# Patient Record
Sex: Female | Born: 1945 | Race: White | Hispanic: No | Marital: Married | Smoking: Current every day smoker
Health system: Southern US, Community
[De-identification: ages and names within clinical notes are randomized; demographics above are authoritative.]

## PROBLEM LIST (undated history)

## (undated) DIAGNOSIS — F1010 Alcohol abuse, uncomplicated: Secondary | ICD-10-CM

## (undated) DIAGNOSIS — K5792 Diverticulitis of intestine, part unspecified, without perforation or abscess without bleeding: Secondary | ICD-10-CM

## (undated) DIAGNOSIS — I6529 Occlusion and stenosis of unspecified carotid artery: Secondary | ICD-10-CM

## (undated) DIAGNOSIS — F32A Depression, unspecified: Secondary | ICD-10-CM

## (undated) DIAGNOSIS — K759 Inflammatory liver disease, unspecified: Secondary | ICD-10-CM

## (undated) DIAGNOSIS — I10 Essential (primary) hypertension: Secondary | ICD-10-CM

## (undated) DIAGNOSIS — M797 Fibromyalgia: Secondary | ICD-10-CM

## (undated) DIAGNOSIS — F329 Major depressive disorder, single episode, unspecified: Secondary | ICD-10-CM

## (undated) HISTORY — PX: TONSILLECTOMY: SUR1361

## (undated) HISTORY — PX: COLONOSCOPY: SHX174

## (undated) HISTORY — PX: COLONOSCOPY WITH ESOPHAGOGASTRODUODENOSCOPY (EGD): SHX5779

## (undated) HISTORY — PX: BUNIONECTOMY: SHX129

---

## 1998-06-11 ENCOUNTER — Emergency Department (HOSPITAL_COMMUNITY): Admission: EM | Admit: 1998-06-11 | Discharge: 1998-06-12 | Payer: Self-pay | Admitting: Emergency Medicine

## 1998-06-24 ENCOUNTER — Emergency Department (HOSPITAL_COMMUNITY): Admission: EM | Admit: 1998-06-24 | Discharge: 1998-06-24 | Payer: Self-pay

## 1999-02-15 ENCOUNTER — Emergency Department (HOSPITAL_COMMUNITY): Admission: EM | Admit: 1999-02-15 | Discharge: 1999-02-15 | Payer: Self-pay | Admitting: Emergency Medicine

## 2003-06-25 ENCOUNTER — Emergency Department (HOSPITAL_COMMUNITY): Admission: EM | Admit: 2003-06-25 | Discharge: 2003-06-25 | Payer: Self-pay | Admitting: *Deleted

## 2003-08-14 ENCOUNTER — Inpatient Hospital Stay (HOSPITAL_COMMUNITY): Admission: EM | Admit: 2003-08-14 | Discharge: 2003-08-16 | Payer: Self-pay | Admitting: Emergency Medicine

## 2003-10-06 ENCOUNTER — Emergency Department (HOSPITAL_COMMUNITY): Admission: EM | Admit: 2003-10-06 | Discharge: 2003-10-07 | Payer: Self-pay | Admitting: Emergency Medicine

## 2004-08-28 ENCOUNTER — Inpatient Hospital Stay (HOSPITAL_COMMUNITY): Admission: RE | Admit: 2004-08-28 | Discharge: 2004-09-01 | Payer: Self-pay | Admitting: Psychiatry

## 2004-08-28 ENCOUNTER — Ambulatory Visit: Payer: Self-pay | Admitting: Psychiatry

## 2004-10-06 ENCOUNTER — Other Ambulatory Visit (HOSPITAL_COMMUNITY): Admission: RE | Admit: 2004-10-06 | Discharge: 2005-01-04 | Payer: Self-pay | Admitting: Psychiatry

## 2004-12-16 ENCOUNTER — Emergency Department (HOSPITAL_COMMUNITY): Admission: EM | Admit: 2004-12-16 | Discharge: 2004-12-16 | Payer: Self-pay | Admitting: Emergency Medicine

## 2004-12-19 ENCOUNTER — Emergency Department (HOSPITAL_COMMUNITY): Admission: EM | Admit: 2004-12-19 | Discharge: 2004-12-19 | Payer: Self-pay | Admitting: Emergency Medicine

## 2005-04-02 ENCOUNTER — Ambulatory Visit: Payer: Self-pay | Admitting: Internal Medicine

## 2005-09-09 ENCOUNTER — Ambulatory Visit: Payer: Self-pay | Admitting: Internal Medicine

## 2005-10-04 ENCOUNTER — Ambulatory Visit: Payer: Self-pay | Admitting: Internal Medicine

## 2005-10-20 ENCOUNTER — Ambulatory Visit: Payer: Self-pay | Admitting: Internal Medicine

## 2006-06-02 ENCOUNTER — Emergency Department (HOSPITAL_COMMUNITY): Admission: EM | Admit: 2006-06-02 | Discharge: 2006-06-02 | Payer: Self-pay | Admitting: *Deleted

## 2006-06-03 ENCOUNTER — Ambulatory Visit: Payer: Self-pay | Admitting: Internal Medicine

## 2006-06-12 ENCOUNTER — Inpatient Hospital Stay (HOSPITAL_COMMUNITY): Admission: EM | Admit: 2006-06-12 | Discharge: 2006-06-15 | Payer: Self-pay | Admitting: *Deleted

## 2006-06-12 ENCOUNTER — Emergency Department (HOSPITAL_COMMUNITY): Admission: EM | Admit: 2006-06-12 | Discharge: 2006-06-12 | Payer: Self-pay | Admitting: Emergency Medicine

## 2006-06-13 ENCOUNTER — Ambulatory Visit: Payer: Self-pay | Admitting: *Deleted

## 2006-11-17 ENCOUNTER — Ambulatory Visit: Payer: Self-pay | Admitting: Internal Medicine

## 2006-11-17 LAB — CONVERTED CEMR LAB
ALT: 52 units/L — ABNORMAL HIGH (ref 0–40)
AST: 46 units/L — ABNORMAL HIGH (ref 0–37)
Albumin: 4.3 g/dL (ref 3.5–5.2)
Alkaline Phosphatase: 81 units/L (ref 39–117)
BUN: 17 mg/dL (ref 6–23)
Basophils Absolute: 0.1 10*3/uL (ref 0.0–0.1)
Basophils Relative: 0.7 % (ref 0.0–1.0)
Bilirubin Urine: NEGATIVE
CO2: 27 meq/L (ref 19–32)
Calcium: 9.8 mg/dL (ref 8.4–10.5)
Chloride: 101 meq/L (ref 96–112)
Chol/HDL Ratio, serum: 4.3
Cholesterol: 243 mg/dL (ref 0–200)
Creatinine, Ser: 0.6 mg/dL (ref 0.4–1.2)
Creatinine,U: 194.8 mg/dL
Eosinophil percent: 1.5 % (ref 0.0–5.0)
GFR calc non Af Amer: 109 mL/min
Glomerular Filtration Rate, Af Am: 132 mL/min/{1.73_m2}
Glucose, Bld: 123 mg/dL — ABNORMAL HIGH (ref 70–99)
HCT: 39.8 % (ref 36.0–46.0)
HDL: 56.1 mg/dL (ref >39.0)
Hemoglobin, Urine: NEGATIVE
Hemoglobin: 13.2 g/dL (ref 12.0–15.0)
Hgb A1c MFr Bld: 7.8 % — ABNORMAL HIGH (ref 4.6–6.0)
LDL DIRECT: 186.5 mg/dL
Leukocytes, UA: NEGATIVE
Lymphocytes Relative: 24.6 % (ref 12.0–46.0)
MCHC: 33.3 g/dL (ref 30.0–36.0)
MCV: 98.7 fL (ref 78.0–100.0)
Microalb Creat Ratio: 10.8 mg/g (ref 0.0–30.0)
Microalb, Ur: 2.1 mg/dL — ABNORMAL HIGH (ref 0.0–1.9)
Monocytes Absolute: 0.6 10*3/uL (ref 0.2–0.7)
Monocytes Relative: 8.4 % (ref 3.0–11.0)
Neutro Abs: 4.6 10*3/uL (ref 1.4–7.7)
Neutrophils Relative %: 64.8 % (ref 43.0–77.0)
Nitrite: NEGATIVE
Platelets: 342 10*3/uL (ref 150–400)
Potassium: 3.6 meq/L (ref 3.5–5.1)
RBC: 4.03 M/uL (ref 3.87–5.11)
RDW: 12.8 % (ref 11.5–14.6)
Sodium: 138 meq/L (ref 135–145)
Specific Gravity, Urine: 1.025 (ref 1.000–1.03)
TSH: 4.64 microintl units/mL (ref 0.35–5.50)
Total Bilirubin: 0.7 mg/dL (ref 0.3–1.2)
Total Protein, Urine: NEGATIVE mg/dL
Total Protein: 7.3 g/dL (ref 6.0–8.3)
Triglyceride fasting, serum: 91 mg/dL (ref 0–149)
Urine Glucose: NEGATIVE mg/dL
Urobilinogen, UA: 0.2 (ref 0.0–1.0)
VLDL: 18 mg/dL (ref 0–40)
WBC: 7.2 10*3/uL (ref 4.5–10.5)
pH: 6.5 (ref 5.0–8.0)

## 2007-01-18 ENCOUNTER — Ambulatory Visit: Payer: Self-pay | Admitting: Internal Medicine

## 2007-03-13 ENCOUNTER — Emergency Department (HOSPITAL_COMMUNITY): Admission: EM | Admit: 2007-03-13 | Discharge: 2007-03-14 | Payer: Self-pay | Admitting: Emergency Medicine

## 2007-03-14 ENCOUNTER — Ambulatory Visit: Payer: Self-pay | Admitting: *Deleted

## 2007-03-14 ENCOUNTER — Inpatient Hospital Stay (HOSPITAL_COMMUNITY): Admission: EM | Admit: 2007-03-14 | Discharge: 2007-03-17 | Payer: Self-pay | Admitting: Psychiatry

## 2007-03-20 ENCOUNTER — Other Ambulatory Visit (HOSPITAL_COMMUNITY): Admission: RE | Admit: 2007-03-20 | Discharge: 2007-06-18 | Payer: Self-pay | Admitting: Psychiatry

## 2007-06-30 ENCOUNTER — Ambulatory Visit: Payer: Self-pay | Admitting: Endocrinology

## 2007-07-03 ENCOUNTER — Emergency Department (HOSPITAL_COMMUNITY): Admission: EM | Admit: 2007-07-03 | Discharge: 2007-07-03 | Payer: Self-pay | Admitting: Emergency Medicine

## 2007-08-20 ENCOUNTER — Encounter: Payer: Self-pay | Admitting: Internal Medicine

## 2007-08-20 DIAGNOSIS — I10 Essential (primary) hypertension: Secondary | ICD-10-CM

## 2007-08-20 DIAGNOSIS — E785 Hyperlipidemia, unspecified: Secondary | ICD-10-CM

## 2007-08-20 DIAGNOSIS — F329 Major depressive disorder, single episode, unspecified: Secondary | ICD-10-CM | POA: Insufficient documentation

## 2007-08-20 DIAGNOSIS — F411 Generalized anxiety disorder: Secondary | ICD-10-CM | POA: Insufficient documentation

## 2007-08-20 DIAGNOSIS — F3289 Other specified depressive episodes: Secondary | ICD-10-CM | POA: Insufficient documentation

## 2007-08-20 HISTORY — DX: Essential (primary) hypertension: I10

## 2007-08-20 HISTORY — DX: Hyperlipidemia, unspecified: E78.5

## 2008-01-05 ENCOUNTER — Encounter: Payer: Self-pay | Admitting: Internal Medicine

## 2008-01-12 ENCOUNTER — Ambulatory Visit: Payer: Self-pay | Admitting: Psychiatry

## 2008-01-12 ENCOUNTER — Inpatient Hospital Stay (HOSPITAL_COMMUNITY): Admission: AD | Admit: 2008-01-12 | Discharge: 2008-01-16 | Payer: Self-pay | Admitting: Psychiatry

## 2008-01-12 ENCOUNTER — Emergency Department (HOSPITAL_COMMUNITY): Admission: EM | Admit: 2008-01-12 | Discharge: 2008-01-12 | Payer: Self-pay | Admitting: Emergency Medicine

## 2008-06-10 ENCOUNTER — Emergency Department (HOSPITAL_COMMUNITY): Admission: EM | Admit: 2008-06-10 | Discharge: 2008-06-10 | Payer: Self-pay | Admitting: Emergency Medicine

## 2008-10-27 ENCOUNTER — Emergency Department (HOSPITAL_COMMUNITY): Admission: EM | Admit: 2008-10-27 | Discharge: 2008-10-27 | Payer: Self-pay | Admitting: Emergency Medicine

## 2008-10-31 ENCOUNTER — Emergency Department (HOSPITAL_COMMUNITY): Admission: EM | Admit: 2008-10-31 | Discharge: 2008-10-31 | Payer: Self-pay | Admitting: Emergency Medicine

## 2009-04-19 ENCOUNTER — Emergency Department (HOSPITAL_COMMUNITY): Admission: EM | Admit: 2009-04-19 | Discharge: 2009-04-19 | Payer: Self-pay | Admitting: Emergency Medicine

## 2009-04-19 ENCOUNTER — Ambulatory Visit: Payer: Self-pay | Admitting: Psychiatry

## 2009-04-19 ENCOUNTER — Inpatient Hospital Stay (HOSPITAL_COMMUNITY): Admission: AD | Admit: 2009-04-19 | Discharge: 2009-04-22 | Payer: Self-pay | Admitting: Psychiatry

## 2009-06-12 ENCOUNTER — Emergency Department (HOSPITAL_COMMUNITY): Admission: EM | Admit: 2009-06-12 | Discharge: 2009-06-12 | Payer: Self-pay | Admitting: Emergency Medicine

## 2009-07-20 ENCOUNTER — Emergency Department (HOSPITAL_COMMUNITY): Admission: EM | Admit: 2009-07-20 | Discharge: 2009-07-21 | Payer: Self-pay | Admitting: Emergency Medicine

## 2009-11-18 ENCOUNTER — Emergency Department (HOSPITAL_COMMUNITY): Admission: EM | Admit: 2009-11-18 | Discharge: 2009-11-19 | Payer: Self-pay | Admitting: Emergency Medicine

## 2010-04-25 ENCOUNTER — Emergency Department (HOSPITAL_COMMUNITY): Admission: EM | Admit: 2010-04-25 | Discharge: 2010-04-26 | Payer: Self-pay | Admitting: Emergency Medicine

## 2010-10-15 ENCOUNTER — Ambulatory Visit (HOSPITAL_COMMUNITY): Admission: RE | Admit: 2010-10-15 | Discharge: 2010-10-15 | Payer: Self-pay | Admitting: Psychiatry

## 2010-10-19 ENCOUNTER — Other Ambulatory Visit (HOSPITAL_COMMUNITY)
Admission: RE | Admit: 2010-10-19 | Discharge: 2010-12-28 | Payer: Self-pay | Source: Home / Self Care | Attending: Psychiatry | Admitting: Psychiatry

## 2010-10-20 ENCOUNTER — Encounter (INDEPENDENT_AMBULATORY_CARE_PROVIDER_SITE_OTHER): Payer: Self-pay | Admitting: *Deleted

## 2010-10-21 ENCOUNTER — Encounter: Admission: RE | Admit: 2010-10-21 | Discharge: 2010-10-21 | Payer: Self-pay | Admitting: Gastroenterology

## 2010-11-04 ENCOUNTER — Ambulatory Visit: Payer: Self-pay | Admitting: Psychiatry

## 2010-11-06 ENCOUNTER — Emergency Department (HOSPITAL_COMMUNITY): Admission: EM | Admit: 2010-11-06 | Discharge: 2010-11-06 | Payer: Self-pay | Admitting: Emergency Medicine

## 2010-11-20 ENCOUNTER — Telehealth (INDEPENDENT_AMBULATORY_CARE_PROVIDER_SITE_OTHER): Payer: Self-pay | Admitting: *Deleted

## 2010-11-24 ENCOUNTER — Ambulatory Visit: Payer: Self-pay | Admitting: Gastroenterology

## 2010-12-28 LAB — BENZODIAZEPINE, QUANTITATIVE, URINE
Alprazolam (GC/LC/MS), ur confirm: NEGATIVE NG/ML
Flurazepam GC/MS Conf: NEGATIVE NG/ML
Lorazepam UR QT: NEGATIVE NG/ML
Nordiazepam GC/MS Conf: NEGATIVE NG/ML
Oxazepam GC/MS Conf: 2346 NG/ML — ABNORMAL HIGH
Temazepam GC/MS Conf: 452 NG/ML — ABNORMAL HIGH

## 2010-12-28 LAB — URINE DRUGS OF ABUSE SCREEN W ALC, ROUTINE (REF LAB)
Amphetamine Screen, Ur: POSITIVE — AB
Amphetamine Screen, Ur: POSITIVE — AB
Barbiturate Quant, Ur: NEGATIVE
Barbiturate Quant, Ur: NEGATIVE
Benzodiazepines.: POSITIVE — AB
Benzodiazepines.: POSITIVE — AB
Cocaine Metabolites: NEGATIVE
Cocaine Metabolites: NEGATIVE
Creatinine,U: 188.9 mg/dL
Creatinine,U: 173.3 mg/dL
Ethyl Alcohol: <10 mg/dL (ref <10)
Ethyl Alcohol: <10 mg/dL (ref <10)
Marijuana Metabolite: NEGATIVE
Marijuana Metabolite: NEGATIVE
Methadone: NEGATIVE
Methadone: NEGATIVE
Opiate Screen, Urine: NEGATIVE
Opiate Screen, Urine: NEGATIVE
Phencyclidine (PCP): NEGATIVE
Phencyclidine (PCP): NEGATIVE
Propoxyphene: NEGATIVE
Propoxyphene: NEGATIVE

## 2010-12-28 LAB — AMPHETAMINES URINE CONFIRMATION
Amphetamines: 251707 NG/ML — ABNORMAL HIGH
Methamphetamine GC/MS, Ur: NEGATIVE NG/ML
Methylenedioxyamphetamine: NEGATIVE NG/ML
Methylenedioxyethylamphetamine: NEGATIVE NG/ML
Methylenedioxymethamphetamine: NEGATIVE NG/ML

## 2011-01-04 LAB — AMPHETAMINES URINE CONFIRMATION
Amphetamines: 14895 NG/ML — ABNORMAL HIGH
Methamphetamine GC/MS, Ur: NEGATIVE NG/ML
Methylenedioxyamphetamine: NEGATIVE NG/ML
Methylenedioxyethylamphetamine: NEGATIVE NG/ML
Methylenedioxymethamphetamine: NEGATIVE NG/ML

## 2011-01-04 LAB — BENZODIAZEPINE, QUANTITATIVE, URINE
Alprazolam (GC/LC/MS), ur confirm: NEGATIVE NG/ML
Flurazepam GC/MS Conf: NEGATIVE NG/ML
Lorazepam UR QT: NEGATIVE NG/ML
Nordiazepam GC/MS Conf: NEGATIVE NG/ML
Oxazepam GC/MS Conf: 891 NG/ML — ABNORMAL HIGH
Temazepam GC/MS Conf: 184 NG/ML — ABNORMAL HIGH

## 2011-01-12 NOTE — Progress Notes (Signed)
[facility]   Phone Note Outgoing Call Call back at [facility] Phone [phone]   Call placed by: Harlow Mares CMA [facility]),  November 20, 2010 2:10 PM Call placed to: Patient Summary of Call: Called pt to find out where she had her abnormal labs from and she states that her abnormal labs are from [facility], Dr. Evette Cristal. She states that she saw him in november. I called [facility] and they say that the patient had a colonoscopy on 10/30/2010 with Dr. Evette Cristal. I advised the patient that since her GI hx was within a year we would be canceling her appt with Dr. Jarold Motto and if she wishes to have an appt with our practice she will have to get all of her GI records faxed over to our office and then a MD will review and let her know if we are able to make her an appt with one of our MDS. patient verbalized understanding. Appt cxed.  Initial call taken by: Harlow Mares CMA [facility]),  November 20, 2010 2:13 PM

## 2011-01-12 NOTE — Letter (Signed)
Summary: New Patient letter  [facility]  [address] [place], [place] [address]   Phone: [phone]  Fax: [phone]       10/20/2010 MRN: [id number]  [name] [address] [place], [place]  [address]  Dear Ms. Molony,  Welcome to the Gastroenterology Division at [facility].    You are scheduled to see Dr.  Jarold Motto on 11/24/2010 at 1:30PM on the 3rd floor at [facility], [address]  We ask that you try to arrive at our office 15 minutes prior to your appointment time to allow for check-in.  We would like you to complete the enclosed self-administered evaluation form prior to your visit and bring it with you on the day of your appointment.  We will review it with you.  Also, please bring a complete list of all your medications or, if you prefer, bring the medication bottles and we will list them.  Please bring your insurance card so that we may make a copy of it.  If your insurance requires a referral to see a specialist, please bring your referral form from your primary care physician.  Co-payments are due at the time of your visit and may be paid by cash, check or credit card.     Your office visit will consist of a consult with your physician (includes a physical exam), any laboratory testing he/she may order, scheduling of any necessary diagnostic testing (e.g. x-ray, ultrasound, CT-scan), and scheduling of a procedure (e.g. Endoscopy, Colonoscopy) if required.  Please allow enough time on your schedule to allow for any/all of these possibilities.    If you cannot keep your appointment, please call [phone] to cancel or reschedule prior to your appointment date.  This allows [place] the opportunity to schedule an appointment for another patient in need of care.  If you do not cancel or reschedule by 5 p.m. the business day prior to your appointment date, you will be charged a $50.00 late cancellation/no-show fee.    Thank you for choosing  [facility] for your medical needs.  We appreciate the opportunity to care for you.  Please visit [place] at our website  to learn more about our practice.                     Sincerely,                                                             The Gastroenterology Division

## 2011-02-22 LAB — URINE DRUGS OF ABUSE SCREEN W ALC, ROUTINE (REF LAB)
Amphetamine Screen, Ur: POSITIVE — AB
Amphetamine Screen, Ur: POSITIVE — AB
Barbiturate Quant, Ur: NEGATIVE
Barbiturate Quant, Ur: NEGATIVE
Benzodiazepines.: POSITIVE — AB
Benzodiazepines.: POSITIVE — AB
Cocaine Metabolites: NEGATIVE
Cocaine Metabolites: NEGATIVE
Creatinine,U: 154.7 mg/dL
Creatinine,U: 160.9 mg/dL
Ethyl Alcohol: <10 mg/dL (ref <10)
Ethyl Alcohol: <10 mg/dL (ref <10)
Marijuana Metabolite: NEGATIVE
Marijuana Metabolite: NEGATIVE
Methadone: NEGATIVE
Methadone: NEGATIVE
Opiate Screen, Urine: NEGATIVE
Opiate Screen, Urine: NEGATIVE
Phencyclidine (PCP): NEGATIVE
Phencyclidine (PCP): NEGATIVE
Propoxyphene: NEGATIVE
Propoxyphene: NEGATIVE

## 2011-02-22 LAB — BENZODIAZEPINE, QUANTITATIVE, URINE
Alprazolam (GC/LC/MS), ur confirm: NEGATIVE NG/ML
Alprazolam (GC/LC/MS), ur confirm: NEGATIVE NG/ML
Flurazepam GC/MS Conf: NEGATIVE NG/ML
Flurazepam GC/MS Conf: NEGATIVE NG/ML
Lorazepam UR QT: NEGATIVE NG/ML
Lorazepam UR QT: NEGATIVE NG/ML
Nordiazepam GC/MS Conf: 150 NG/ML — ABNORMAL HIGH
Nordiazepam GC/MS Conf: 270 NG/ML — ABNORMAL HIGH
Oxazepam GC/MS Conf: 2417 NG/ML — ABNORMAL HIGH
Oxazepam GC/MS Conf: 3797 NG/ML — ABNORMAL HIGH
Temazepam GC/MS Conf: 1595 NG/ML — ABNORMAL HIGH
Temazepam GC/MS Conf: 694 NG/ML — ABNORMAL HIGH

## 2011-02-22 LAB — AMPHETAMINES URINE CONFIRMATION
Amphetamines: 123504 NG/ML — ABNORMAL HIGH
Amphetamines: 94140 NG/ML — ABNORMAL HIGH
Methamphetamine GC/MS, Ur: NEGATIVE NG/ML
Methamphetamine GC/MS, Ur: NEGATIVE NG/ML
Methylenedioxyamphetamine: NEGATIVE NG/ML
Methylenedioxyamphetamine: NEGATIVE NG/ML
Methylenedioxyethylamphetamine: NEGATIVE NG/ML
Methylenedioxyethylamphetamine: NEGATIVE NG/ML
Methylenedioxymethamphetamine: NEGATIVE NG/ML
Methylenedioxymethamphetamine: NEGATIVE NG/ML

## 2011-02-23 LAB — DIFFERENTIAL
Basophils Absolute: 0 10*3/uL (ref 0.0–0.1)
Basophils Relative: 0 % (ref 0–1)
Eosinophils Absolute: 0 10*3/uL (ref 0.0–0.7)
Eosinophils Relative: 1 % (ref 0–5)
Lymphocytes Relative: 30 % (ref 12–46)
Lymphs Abs: 1.8 10*3/uL (ref 0.7–4.0)
Monocytes Absolute: 0.4 10*3/uL (ref 0.1–1.0)
Monocytes Relative: 6 % (ref 3–12)
Neutro Abs: 3.7 10*3/uL (ref 1.7–7.7)
Neutrophils Relative %: 63 % (ref 43–77)

## 2011-02-23 LAB — URINE DRUGS OF ABUSE SCREEN W ALC, ROUTINE (REF LAB)
Amphetamine Screen, Ur: NEGATIVE
Barbiturate Quant, Ur: NEGATIVE
Benzodiazepines.: POSITIVE — AB
Cocaine Metabolites: NEGATIVE
Creatinine,U: 200.8 mg/dL
Ethyl Alcohol: <10 mg/dL (ref <10)
Marijuana Metabolite: NEGATIVE
Methadone: NEGATIVE
Opiate Screen, Urine: NEGATIVE
Phencyclidine (PCP): NEGATIVE
Propoxyphene: NEGATIVE

## 2011-02-23 LAB — COMPREHENSIVE METABOLIC PANEL
ALT: 60 U/L — ABNORMAL HIGH (ref 0–35)
AST: 107 U/L — ABNORMAL HIGH (ref 0–37)
Albumin: 4 g/dL (ref 3.5–5.2)
Alkaline Phosphatase: 185 U/L — ABNORMAL HIGH (ref 39–117)
BUN: 17 mg/dL (ref 6–23)
CO2: 27 mEq/L (ref 19–32)
Calcium: 8.7 mg/dL (ref 8.4–10.5)
Chloride: 97 mEq/L (ref 96–112)
Creatinine, Ser: 0.81 mg/dL (ref 0.4–1.2)
GFR calc Af Amer: >60 mL/min (ref >60)
GFR calc non Af Amer: >60 mL/min (ref >60)
Glucose, Bld: 210 mg/dL — ABNORMAL HIGH (ref 70–99)
Potassium: 3.5 mEq/L (ref 3.5–5.1)
Sodium: 137 mEq/L (ref 135–145)
Total Bilirubin: 2 mg/dL — ABNORMAL HIGH (ref 0.3–1.2)
Total Protein: 7.1 g/dL (ref 6.0–8.3)

## 2011-02-23 LAB — URINALYSIS, ROUTINE W REFLEX MICROSCOPIC
Glucose, UA: NEGATIVE mg/dL
Nitrite: POSITIVE — AB
Protein, ur: 100 mg/dL — AB
Specific Gravity, Urine: 1.026 (ref 1.005–1.030)
Urobilinogen, UA: 0.2 mg/dL (ref 0.0–1.0)
pH: 6 (ref 5.0–8.0)

## 2011-02-23 LAB — GLUCOSE, CAPILLARY: Glucose-Capillary: 218 mg/dL — ABNORMAL HIGH (ref 70–99)

## 2011-02-23 LAB — BENZODIAZEPINE, QUANTITATIVE, URINE
Alprazolam (GC/LC/MS), ur confirm: NEGATIVE NG/ML
Flurazepam GC/MS Conf: NEGATIVE NG/ML
Lorazepam UR QT: NEGATIVE NG/ML
Nordiazepam GC/MS Conf: 656 NG/ML — ABNORMAL HIGH
Oxazepam GC/MS Conf: 3158 NG/ML — ABNORMAL HIGH
Temazepam GC/MS Conf: 4269 NG/ML — ABNORMAL HIGH

## 2011-02-23 LAB — CBC
HCT: 41.4 % (ref 36.0–46.0)
Hemoglobin: 14.5 g/dL (ref 12.0–15.0)
MCH: 33.6 pg (ref 26.0–34.0)
MCHC: 34.9 g/dL (ref 30.0–36.0)
MCV: 96.3 fL (ref 78.0–100.0)
Platelets: 176 10*3/uL (ref 150–400)
RBC: 4.3 MIL/uL (ref 3.87–5.11)
RDW: 17.3 % — ABNORMAL HIGH (ref 11.5–15.5)
WBC: 6 10*3/uL (ref 4.0–10.5)

## 2011-02-23 LAB — URINE CULTURE
Colony Count: >=100000
Culture  Setup Time: 201111260114

## 2011-02-23 LAB — LIPASE, BLOOD: Lipase: 38 U/L (ref 11–59)

## 2011-02-23 LAB — URINE MICROSCOPIC-ADD ON

## 2011-03-02 LAB — URINALYSIS, ROUTINE W REFLEX MICROSCOPIC
Bilirubin Urine: NEGATIVE
Glucose, UA: NEGATIVE mg/dL
Hgb urine dipstick: NEGATIVE
Ketones, ur: NEGATIVE mg/dL
Nitrite: POSITIVE — AB
Protein, ur: NEGATIVE mg/dL
Specific Gravity, Urine: 1.008 (ref 1.005–1.030)
Urobilinogen, UA: 0.2 mg/dL (ref 0.0–1.0)
pH: 6.5 (ref 5.0–8.0)

## 2011-03-02 LAB — URINE MICROSCOPIC-ADD ON

## 2011-03-02 LAB — COMPREHENSIVE METABOLIC PANEL
ALT: 23 U/L (ref 0–35)
AST: 24 U/L (ref 0–37)
Albumin: 3.9 g/dL (ref 3.5–5.2)
Alkaline Phosphatase: 79 U/L (ref 39–117)
BUN: 12 mg/dL (ref 6–23)
CO2: 29 mEq/L (ref 19–32)
Calcium: 8.4 mg/dL (ref 8.4–10.5)
Chloride: 100 mEq/L (ref 96–112)
Creatinine, Ser: 0.62 mg/dL (ref 0.4–1.2)
GFR calc Af Amer: >60 mL/min (ref >60)
GFR calc non Af Amer: >60 mL/min (ref >60)
Glucose, Bld: 144 mg/dL — ABNORMAL HIGH (ref 70–99)
Potassium: 4.1 mEq/L (ref 3.5–5.1)
Sodium: 138 mEq/L (ref 135–145)
Total Bilirubin: 0.7 mg/dL (ref 0.3–1.2)
Total Protein: 7 g/dL (ref 6.0–8.3)

## 2011-03-02 LAB — DIFFERENTIAL
Basophils Absolute: 0 10*3/uL (ref 0.0–0.1)
Basophils Relative: 1 % (ref 0–1)
Eosinophils Absolute: 0.1 10*3/uL (ref 0.0–0.7)
Eosinophils Relative: 2 % (ref 0–5)
Lymphocytes Relative: 57 % — ABNORMAL HIGH (ref 12–46)
Lymphs Abs: 3.5 10*3/uL (ref 0.7–4.0)
Monocytes Absolute: 0.6 10*3/uL (ref 0.1–1.0)
Monocytes Relative: 10 % (ref 3–12)
Neutro Abs: 1.9 10*3/uL (ref 1.7–7.7)
Neutrophils Relative %: 30 % — ABNORMAL LOW (ref 43–77)

## 2011-03-02 LAB — GLUCOSE, CAPILLARY
Glucose-Capillary: 151 mg/dL — ABNORMAL HIGH (ref 70–99)
Glucose-Capillary: 245 mg/dL — ABNORMAL HIGH (ref 70–99)

## 2011-03-02 LAB — RAPID URINE DRUG SCREEN, HOSP PERFORMED
Amphetamines: NOT DETECTED
Barbiturates: NOT DETECTED
Benzodiazepines: POSITIVE — AB
Cocaine: NOT DETECTED
Opiates: NOT DETECTED
Tetrahydrocannabinol: NOT DETECTED

## 2011-03-02 LAB — CBC
HCT: 40.5 % (ref 36.0–46.0)
Hemoglobin: 13.4 g/dL (ref 12.0–15.0)
MCHC: 33.1 g/dL (ref 30.0–36.0)
MCV: 95 fL (ref 78.0–100.0)
Platelets: 233 10*3/uL (ref 150–400)
RBC: 4.27 MIL/uL (ref 3.87–5.11)
RDW: 16.7 % — ABNORMAL HIGH (ref 11.5–15.5)
WBC: 6.1 10*3/uL (ref 4.0–10.5)

## 2011-03-02 LAB — ETHANOL
Alcohol, Ethyl (B): 239 mg/dL — ABNORMAL HIGH (ref 0–10)
Alcohol, Ethyl (B): 333 mg/dL — ABNORMAL HIGH (ref 0–10)

## 2011-03-16 LAB — BASIC METABOLIC PANEL
BUN: 11 mg/dL (ref 6–23)
CO2: 23 mEq/L (ref 19–32)
Calcium: 8.8 mg/dL (ref 8.4–10.5)
Chloride: 101 mEq/L (ref 96–112)
Creatinine, Ser: 0.5 mg/dL (ref 0.4–1.2)
GFR calc Af Amer: >60 mL/min (ref >60)
GFR calc non Af Amer: >60 mL/min (ref >60)
Glucose, Bld: 172 mg/dL — ABNORMAL HIGH (ref 70–99)
Potassium: 4.3 mEq/L (ref 3.5–5.1)
Sodium: 135 mEq/L (ref 135–145)

## 2011-03-16 LAB — RAPID URINE DRUG SCREEN, HOSP PERFORMED
Amphetamines: NOT DETECTED
Barbiturates: NOT DETECTED
Benzodiazepines: POSITIVE — AB
Cocaine: NOT DETECTED
Opiates: NOT DETECTED
Tetrahydrocannabinol: NOT DETECTED

## 2011-03-16 LAB — URINALYSIS, ROUTINE W REFLEX MICROSCOPIC
Bilirubin Urine: NEGATIVE
Glucose, UA: NEGATIVE mg/dL
Ketones, ur: NEGATIVE mg/dL
Nitrite: NEGATIVE
Protein, ur: NEGATIVE mg/dL
Specific Gravity, Urine: 1.01 (ref 1.005–1.030)
Urobilinogen, UA: 0.2 mg/dL (ref 0.0–1.0)
pH: 5.5 (ref 5.0–8.0)

## 2011-03-16 LAB — GLUCOSE, CAPILLARY: Glucose-Capillary: 212 mg/dL — ABNORMAL HIGH (ref 70–99)

## 2011-03-16 LAB — URINE MICROSCOPIC-ADD ON

## 2011-03-16 LAB — SALICYLATE LEVEL: Salicylate Lvl: <4 mg/dL (ref 2.8–20.0)

## 2011-03-16 LAB — ACETAMINOPHEN LEVEL: Acetaminophen (Tylenol), Serum: <10 ug/mL — ABNORMAL LOW (ref 10–30)

## 2011-03-16 LAB — ETHANOL: Alcohol, Ethyl (B): 344 mg/dL — ABNORMAL HIGH (ref 0–10)

## 2011-03-20 LAB — ETHANOL
Alcohol, Ethyl (B): 242 mg/dL — ABNORMAL HIGH (ref 0–10)
Alcohol, Ethyl (B): 315 mg/dL — ABNORMAL HIGH (ref 0–10)

## 2011-03-20 LAB — CBC
HCT: 42.1 % (ref 36.0–46.0)
Hemoglobin: 14.1 g/dL (ref 12.0–15.0)
MCHC: 33.5 g/dL (ref 30.0–36.0)
MCV: 101.1 fL — ABNORMAL HIGH (ref 78.0–100.0)
Platelets: 231 10*3/uL (ref 150–400)
RBC: 4.16 MIL/uL (ref 3.87–5.11)
RDW: 14.1 % (ref 11.5–15.5)
WBC: 5.3 10*3/uL (ref 4.0–10.5)

## 2011-03-20 LAB — BASIC METABOLIC PANEL
BUN: 20 mg/dL (ref 6–23)
CO2: 26 mEq/L (ref 19–32)
Calcium: 8.3 mg/dL — ABNORMAL LOW (ref 8.4–10.5)
Chloride: 100 mEq/L (ref 96–112)
Creatinine, Ser: 0.66 mg/dL (ref 0.4–1.2)
GFR calc Af Amer: >60 mL/min (ref >60)
GFR calc non Af Amer: >60 mL/min (ref >60)
Glucose, Bld: 121 mg/dL — ABNORMAL HIGH (ref 70–99)
Potassium: 4.5 mEq/L (ref 3.5–5.1)
Sodium: 137 mEq/L (ref 135–145)

## 2011-03-20 LAB — RAPID URINE DRUG SCREEN, HOSP PERFORMED
Amphetamines: NOT DETECTED
Barbiturates: NOT DETECTED
Benzodiazepines: POSITIVE — AB
Cocaine: NOT DETECTED
Opiates: NOT DETECTED
Tetrahydrocannabinol: NOT DETECTED

## 2011-03-20 LAB — DIFFERENTIAL
Basophils Absolute: 0.1 10*3/uL (ref 0.0–0.1)
Basophils Relative: 2 % — ABNORMAL HIGH (ref 0–1)
Eosinophils Absolute: 0 10*3/uL (ref 0.0–0.7)
Eosinophils Relative: 1 % (ref 0–5)
Lymphocytes Relative: 63 % — ABNORMAL HIGH (ref 12–46)
Lymphs Abs: 3.3 10*3/uL (ref 0.7–4.0)
Monocytes Absolute: 0.5 10*3/uL (ref 0.1–1.0)
Monocytes Relative: 9 % (ref 3–12)
Neutro Abs: 1.3 10*3/uL — ABNORMAL LOW (ref 1.7–7.7)
Neutrophils Relative %: 25 % — ABNORMAL LOW (ref 43–77)

## 2011-03-22 LAB — URINALYSIS, ROUTINE W REFLEX MICROSCOPIC
Bilirubin Urine: NEGATIVE
Glucose, UA: NEGATIVE mg/dL
Ketones, ur: NEGATIVE mg/dL
Nitrite: NEGATIVE
Protein, ur: 30 mg/dL — AB
Specific Gravity, Urine: 1.03 (ref 1.005–1.030)
Urobilinogen, UA: 0.2 mg/dL (ref 0.0–1.0)
pH: 5.5 (ref 5.0–8.0)

## 2011-03-22 LAB — COMPREHENSIVE METABOLIC PANEL
ALT: 31 U/L (ref 0–35)
AST: 52 U/L — ABNORMAL HIGH (ref 0–37)
Albumin: 3.9 g/dL (ref 3.5–5.2)
Alkaline Phosphatase: 67 U/L (ref 39–117)
BUN: 17 mg/dL (ref 6–23)
CO2: 29 mEq/L (ref 19–32)
Calcium: 9 mg/dL (ref 8.4–10.5)
Chloride: 104 mEq/L (ref 96–112)
Creatinine, Ser: 0.64 mg/dL (ref 0.4–1.2)
GFR calc Af Amer: >60 mL/min (ref >60)
GFR calc non Af Amer: >60 mL/min (ref >60)
Glucose, Bld: 124 mg/dL — ABNORMAL HIGH (ref 70–99)
Potassium: 5.1 mEq/L (ref 3.5–5.1)
Sodium: 142 mEq/L (ref 135–145)
Total Bilirubin: 0.7 mg/dL (ref 0.3–1.2)
Total Protein: 7 g/dL (ref 6.0–8.3)

## 2011-03-22 LAB — DIFFERENTIAL
Basophils Absolute: 0 10*3/uL (ref 0.0–0.1)
Basophils Relative: 1 % (ref 0–1)
Eosinophils Absolute: 0.1 10*3/uL (ref 0.0–0.7)
Eosinophils Relative: 1 % (ref 0–5)
Lymphocytes Relative: 25 % (ref 12–46)
Lymphs Abs: 1.9 10*3/uL (ref 0.7–4.0)
Monocytes Absolute: 0.8 10*3/uL (ref 0.1–1.0)
Monocytes Relative: 10 % (ref 3–12)
Neutro Abs: 5.1 10*3/uL (ref 1.7–7.7)
Neutrophils Relative %: 64 % (ref 43–77)

## 2011-03-22 LAB — CBC
HCT: 39.7 % (ref 36.0–46.0)
Hemoglobin: 13.8 g/dL (ref 12.0–15.0)
MCHC: 34.7 g/dL (ref 30.0–36.0)
MCV: 101.8 fL — ABNORMAL HIGH (ref 78.0–100.0)
Platelets: 175 10*3/uL (ref 150–400)
RBC: 3.9 MIL/uL (ref 3.87–5.11)
RDW: 14.2 % (ref 11.5–15.5)
WBC: 7.9 10*3/uL (ref 4.0–10.5)

## 2011-03-22 LAB — URINE MICROSCOPIC-ADD ON

## 2011-03-22 LAB — LIPASE, BLOOD: Lipase: 34 U/L (ref 11–59)

## 2011-03-22 LAB — ETHANOL: Alcohol, Ethyl (B): <5 mg/dL (ref 0–10)

## 2011-03-23 LAB — COMPREHENSIVE METABOLIC PANEL
ALT: 52 U/L — ABNORMAL HIGH (ref 0–35)
AST: 67 U/L — ABNORMAL HIGH (ref 0–37)
Albumin: 3.8 g/dL (ref 3.5–5.2)
Alkaline Phosphatase: 86 U/L (ref 39–117)
BUN: 14 mg/dL (ref 6–23)
CO2: 26 mEq/L (ref 19–32)
Calcium: 8.8 mg/dL (ref 8.4–10.5)
Chloride: 101 mEq/L (ref 96–112)
Creatinine, Ser: 0.65 mg/dL (ref 0.4–1.2)
GFR calc Af Amer: >60 mL/min (ref >60)
GFR calc non Af Amer: >60 mL/min (ref >60)
Glucose, Bld: 136 mg/dL — ABNORMAL HIGH (ref 70–99)
Potassium: 4.4 mEq/L (ref 3.5–5.1)
Sodium: 136 mEq/L (ref 135–145)
Total Bilirubin: 0.4 mg/dL (ref 0.3–1.2)
Total Protein: 6.7 g/dL (ref 6.0–8.3)

## 2011-03-23 LAB — DIFFERENTIAL
Basophils Absolute: 0 10*3/uL (ref 0.0–0.1)
Basophils Relative: 1 % (ref 0–1)
Eosinophils Absolute: 0.1 10*3/uL (ref 0.0–0.7)
Eosinophils Relative: 1 % (ref 0–5)
Lymphocytes Relative: 41 % (ref 12–46)
Lymphs Abs: 2.3 10*3/uL (ref 0.7–4.0)
Monocytes Absolute: 0.7 10*3/uL (ref 0.1–1.0)
Monocytes Relative: 13 % — ABNORMAL HIGH (ref 3–12)
Neutro Abs: 2.5 10*3/uL (ref 1.7–7.7)
Neutrophils Relative %: 45 % (ref 43–77)

## 2011-03-23 LAB — URINALYSIS, ROUTINE W REFLEX MICROSCOPIC
Bilirubin Urine: NEGATIVE
Glucose, UA: NEGATIVE mg/dL
Hgb urine dipstick: NEGATIVE
Ketones, ur: NEGATIVE mg/dL
Nitrite: POSITIVE — AB
Protein, ur: NEGATIVE mg/dL
Specific Gravity, Urine: 1.015 (ref 1.005–1.030)
Urobilinogen, UA: 0.2 mg/dL (ref 0.0–1.0)
pH: 5.5 (ref 5.0–8.0)

## 2011-03-23 LAB — GLUCOSE, CAPILLARY
Glucose-Capillary: 117 mg/dL — ABNORMAL HIGH (ref 70–99)
Glucose-Capillary: 123 mg/dL — ABNORMAL HIGH (ref 70–99)
Glucose-Capillary: 132 mg/dL — ABNORMAL HIGH (ref 70–99)
Glucose-Capillary: 133 mg/dL — ABNORMAL HIGH (ref 70–99)
Glucose-Capillary: 136 mg/dL — ABNORMAL HIGH (ref 70–99)
Glucose-Capillary: 139 mg/dL — ABNORMAL HIGH (ref 70–99)
Glucose-Capillary: 139 mg/dL — ABNORMAL HIGH (ref 70–99)
Glucose-Capillary: 160 mg/dL — ABNORMAL HIGH (ref 70–99)
Glucose-Capillary: 194 mg/dL — ABNORMAL HIGH (ref 70–99)
Glucose-Capillary: 135 mg/dL — ABNORMAL HIGH (ref 70–99)

## 2011-03-23 LAB — CBC
HCT: 37 % (ref 36.0–46.0)
Hemoglobin: 12.9 g/dL (ref 12.0–15.0)
MCHC: 34.9 g/dL (ref 30.0–36.0)
MCV: 100.2 fL — ABNORMAL HIGH (ref 78.0–100.0)
Platelets: 332 10*3/uL (ref 150–400)
RBC: 3.69 MIL/uL — ABNORMAL LOW (ref 3.87–5.11)
RDW: 15.5 % (ref 11.5–15.5)
WBC: 5.7 10*3/uL (ref 4.0–10.5)

## 2011-03-23 LAB — CK TOTAL AND CKMB (NOT AT ARMC)
CK, MB: 2.1 ng/mL (ref 0.3–4.0)
Relative Index: INVALID (ref 0.0–2.5)
Total CK: 62 U/L (ref 7–177)

## 2011-03-23 LAB — RAPID URINE DRUG SCREEN, HOSP PERFORMED
Amphetamines: NOT DETECTED
Barbiturates: NOT DETECTED
Benzodiazepines: POSITIVE — AB
Cocaine: NOT DETECTED
Opiates: NOT DETECTED
Tetrahydrocannabinol: NOT DETECTED

## 2011-03-23 LAB — TROPONIN I: Troponin I: 0.02 ng/mL (ref 0.00–0.06)

## 2011-03-23 LAB — URINE MICROSCOPIC-ADD ON

## 2011-03-23 LAB — POCT CARDIAC MARKERS
CKMB, poc: 1.9 ng/mL (ref 1.0–8.0)
Myoglobin, poc: 44.7 ng/mL (ref 12–200)
Troponin i, poc: <0.05 ng/mL (ref 0.00–0.09)

## 2011-03-23 LAB — URINE CULTURE: Colony Count: >=100000

## 2011-03-23 LAB — ETHANOL
Alcohol, Ethyl (B): 145 mg/dL — ABNORMAL HIGH (ref 0–10)
Alcohol, Ethyl (B): 263 mg/dL — ABNORMAL HIGH (ref 0–10)

## 2011-03-23 LAB — ACETAMINOPHEN LEVEL
Acetaminophen (Tylenol), Serum: <10 ug/mL — ABNORMAL LOW (ref 10–30)
Acetaminophen (Tylenol), Serum: <10 ug/mL — ABNORMAL LOW (ref 10–30)

## 2011-03-23 LAB — SALICYLATE LEVEL: Salicylate Lvl: <4 mg/dL (ref 2.8–20.0)

## 2011-03-23 LAB — LIPASE, BLOOD: Lipase: 25 U/L (ref 11–59)

## 2011-04-27 NOTE — Discharge Summary (Signed)
Charlene, [name] NO.:  [id number]   MEDICAL RECORD NO.:  [id number]          PATIENT TYPE:  IPS   LOCATION:  0307                          FACILITY:  BH   PHYSICIAN:  Jasmine Pang, M.D. DATE OF BIRTH:  21-Jan-1946   DATE OF ADMISSION:  04/19/2009  DATE OF DISCHARGE:  04/22/2009                               DISCHARGE SUMMARY   IDENTIFICATION:  This is a 65 year old married white female who was  admitted on a voluntary basis on Apr 19, 2009.   HISTORY OF PRESENT ILLNESS:  The patient relapsed on alcohol.  She has  been drinking wine.  She took an overdose to cope with family stressors.  She presented with lethargy and altered mental status.  Alcohol level  was 145.  She also states she took 10 Valium.  For further admission  information, see psychiatric admission assessment.   PHYSICAL FINDINGS:  There were no acute physical or medical problems  noted.   ADMISSION LABORATORY:  CT scan of the brain was not acute.  UDS was  positive for benzodiazepine.   HOSPITAL COURSE:  Upon admission, the patient was restarted on her  Januvia 100 mg daily, lisinopril 20 mg daily, nitrofurantoin 100 mg  daily at bedtime, and Librium detox protocol.  She was also started on  trazodone 15 mg p.o. q.h.s.  In individual sessions, the patient became  less depressed though still was anxious.  As hospitalization progressed,  she discussed her history of alcohol abuse.  She was tolerating the  Librium detox protocol well with no side effects.  Due to difficulty  sleeping, the patient's trazodone was discontinued and she was started  on Ambien 10 mg p.o. q.h.s.  She was also started on ibuprofen 600 mg  p.o. q.6 h. p.r.n. pain.  On Apr 21, 2009, the patient talked to the  case manager.  She stated she did not want a family session with her  husband stating that she has been treated for recovery process many  times and her husband made it clear that she was on her own and he  would  not attend any more counseling sessions.  She did not want a counseling,  but was willing to go back to see Dr. Evelene Croon for followup treatment.  On  Apr 22, 2009, mental status had improved markedly from admission status.  The patient was less depressed, less anxious.  Affect was consistent  with mood.  There was no suicidal or homicidal ideation.  No thoughts of  self-injurious behavior.  Sleep was good, appetite was good.  No  auditory or visual hallucinations.  No paranoia or delusions.  Thoughts  were logical and goal-directed, thought content.  No predominant theme.  Cognitive was grossly intact.  Insight good, judgment good, impulse  control good.  The patient felt ready to go home today and it was felt  to be safe for discharge.   DISCHARGE DIAGNOSES:  Axis I:  Depressive disorder, not otherwise  specified.  Alcohol abuse.  Axis II:  None.  Axis III:  Diabetes mellitus type 2.  Axis IV:  Moderate (problems with primary support group, burden of  psychiatric illness, and medical problems).  Axis V:  Global assessment of functioning was 50 upon discharge.  GAF  was 30 upon admission.  GAF highest past year was 65.   DISCHARGE PLAN:  There was no specific activity level or dietary  restrictions.   POSTHOSPITAL CARE PLAN:  The patient will see Dr. Evelene Croon on May 09, 2009.  She will return to see Maxcine Ham for help with disposition on  Apr 29, 2009, at 2 p.m. if she feels necessary.   DISCHARGE MEDICATIONS:  1. Januvia 100 mg daily.  2. Lisinopril 20 mg daily.  3. Nitrofuran 100 mg at bedtime.      Jasmine Pang, M.D.  Electronically Signed     BHS/MEDQ  D:  04/22/2009  T:  04/22/2009  Job:  [id number]

## 2011-04-27 NOTE — H&P (Signed)
[name], [name] NO.:  [id number]   MEDICAL RECORD NO.:  [id number]          PATIENT TYPE:  IPS   LOCATION:  0503                          FACILITY:  BH   PHYSICIAN:  Geoffery Lyons, M.D.      DATE OF BIRTH:  1946-11-29   DATE OF ADMISSION:  01/12/2008  DATE OF DISCHARGE:                       PSYCHIATRIC ADMISSION ASSESSMENT   This is a voluntary admission to the services of Dr. Geoffery Lyons.   This is a 65 year old married white female.  She presented to the  emergency department at [facility] requesting detoxification.  This was  at 9:46 in the morning, and her alcohol level registered 348.  She  reported that her last drink had been two hours prior.  She has a long  history for alcohol abuse.  She binges.  She has had more difficulty  staying sober since her son's death by self-inflicted gunshot wound in  2001.  She was last an inpatient here from April 1-4, 2008.  She then  did the outpatient IOP in the summer of 2008.  She states that she was  sober for approximately 2-3 months before relapsing.  She commutes up to  [place] to help out her daughter, who is a single parent of a 3-year-  old and 62-year-old, and the stress just builds up.   PAST PSYCHIATRIC HISTORY:  She has been with [place] twice before, once in  2008 and once in 2007.  She also had admissions in 1993 and 1988 for  detox.  She has had medical admissions in the past for overdosing on  meds.   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  She has been married twice.  She has a daughter, 60.  She is not  employed, but she commute up to [place] to help her daughter with the  grandchildren.   FAMILY HISTORY:  Both sides of her family had alcoholism, and her son  shot himself and was successful in his suicide attempt.   ALCOHOL/DRUG HISTORY:  She has had issues with alcohol since her college  days.  Her primary care Jonuel Butterfield is Dr. Regino Schultze.  Her psychiatrist is  Dr.  Lafayette Dragon.  She is followed by endocrinology at [facility] for her diabetes.   MEDICAL PROBLEMS:  1. Diabetes.  2. Hypertension.  3. Hyperlipidemia.   MEDICATIONS:  1. She is currently prescribed Ritalin 20 mg p.o. daily.  2. Metformin ER 1000 mg p.o. nightly.  3. Lisinopril 40 mg p.o. daily.  4. Lyrica 100 mg p.o. t.i.d.  5. Jenuvia 100 mg p.o. daily.   DRUG ALLERGIES:  SULFA.  She also reports many side effects from a  variety of medications and is very hesitant to take any medications.   POSITIVE PHYSICAL FINDINGS:  She was medically cleared in the ED at  [facility].  Her glucose was noted to be 148.  Her alcohol level was  enthusiastic at 348.  Her urine drug screen was negative.  She had no  other remarkable findings.   PHYSICAL EXAMINATION:  Vital signs on admission show she is 5  foot 3.  Weighs 169.  Temperature is 97.5, blood pressure 155/88 to 142/84.  Pulse is 122-111, respirations are 18.  She has a bandage under her right buttock.  This is from a heating pad  mishap.  She does have a bandage on there.  She does have a C-section  scar.  She reports fibromyalgia.   MENTAL STATUS EXAM:  She was examined in the room.  She was alert,  albeit in bed.  She was casually groomed and dressed in a hospital gown.  She appears to be adequately nourished.  Her speech had a normal rate,  rhythm, and tone.  Her mood was appropriate to the situation.  Her  affect is somewhat flat.  Her thought processes are clear, rational, and  goal oriented.  She just wants to get detoxed and go back to AA.  Judgment and insight are good.  Concentration and memory are good.  She  denies being homicidal or suicidal.  She denies auditory or visual  hallucinations.   DIAGNOSES:   AXIS I:  Alcohol dependence.   AXIS II:  Deferred.   AXIS III:  1. Diabetes mellitus.  2. Hypertension.  3. Hyperlipidemia.   AXIS IV:  Issues with primary support group.   AXIS V:  1.   The plan is to admit for detox.   Towards that end, she was put on a low  dose Librium protocol.  Initiation of Campral to help her maintain her  sobriety was discussed; however, the patient refused to initiate at this  time.  She will discuss with her private psychiatrist, Dr. Sharl Ma.   Estimated length of stay is four days.      [name], P.A.-C.      Geoffery Lyons, M.D.  Electronically Signed    MD/MEDQ  D:  01/13/2008  T:  01/13/2008  Job:  [id number]
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
## 2011-04-30 NOTE — Discharge Summary (Addendum)
[name], [name] NO.:  [id number]   MEDICAL RECORD NO.:  [id number]          PATIENT TYPE:  IPS   LOCATION:  0503                          FACILITY:  [facility]   PHYSICIAN:  Geoffery Lyons, M.D.      DATE OF BIRTH:  Dec 18, 1945   DATE OF ADMISSION:  01/12/2008  DATE OF DISCHARGE:  01/16/2008                               DISCHARGE SUMMARY   CHIEF COMPLAINT:  This was the third admission to [facility] for this 65 year old married white female who presented to the ED  requesting detox.  At  9:46 in the morning, her alcohol level was 348.  Last drink had been 2 hours prior.  Known history of alcohol abuse,  binging.  She has had more difficulty staying sober since her son's  death by self-inflicted gunshot wound in 2001.  Last inpatient April of  2008.  Did [facility] in the summer of 2008.  Sober for 2-3 months before  relapsing.  Commutes up to [place] to help out her daughter who is a  single parent of a 34-year-old and a 20-year-old.   PAST PSYCHIATRIC HISTORY:  Twice at [facility] in 2007  and 2008, with course of treatment through [facility].  Has been detoxed  before that in 1993 and 1998.   ____ QA MARKER: 92 ____ HISTO  already stated.   PAST MEDICAL HISTORY:  1. History of diabetes mellitus.  2. Hypertension.  3. Hyperlipidemia.   MEDICATIONS:  1. Ritalin 20 mg.  2. Metformin ER at 1000 mg at night.  3. Lisinopril 40 mg per day.  4. Lyrica 100 mg 3 times a day.  5. Januvia 100 mg per day.   PHYSICAL EXAMINATION:  Physical exam failed to show acute findings.   LABORATORY DATA:  TSH 5.208.  Drug screen negative for substances of  abuse.  White blood cells 5.2, hemoglobin 14.4, sodium 137, potassium  4.3, glucose 148.  SGOT 73, SGPT 26.   PHYSICAL EXAMINATION:  Alert, cooperative female, casually groomed and  dressed.  Speech was normal in rate, tempo, and production.  Mood  depressed.  Affect depressed.  Thought processes  are logical, coherent  and relevant wanting to be detoxed, go back to AA.  A lot of shame and  guilt for the ongoing relapses.  No delusions.  No suicidal or homicidal  ideas.  No hallucinations.  Cognition well-preserved.   DIAGNOSES:   AXIS I:  Alcohol dependence, Attention deficit hyperactivity disorder.   AXIS II:  No diagnosis.   AXIS III:  Diabetes mellitus, hypertension, hyperlipidemia.   AXIS IV:  Moderate.   AXIS V:  GAF on admission 35, in the last year 60.   COURSE IN THE HOSPITAL:  She was admitted and started on individual and  group psychotherapy.  She was detoxified on Librium.  She was maintained  on her medications.  As already stated, she endorsed that she relapsed  using alcohol after the holiday, started sipping wine and then went to  full relapse, drinking 4 small bottles of wine.  With the increased use,  became completely out of control and unable to stop.  Became shaky and  came for detox.  She stopped AA back in August or September.  Her main  stress was dealing with her daughter who she claimed is in an abusive  relationship with her husband.  She claimed that after she came back  from [place] after intervening with the daughter and her husband, she  started drinking on the plane.  She also endorsed that the husband  suffers from depression.  He was working, no relapse for 10-12 years,  with no treatment.  He stopped antidepressant.  We continued to detox.  The detox went on uneventfully.  On January 16, 2008, we were trying to  arrange for a family session, but she was not able to get the husband to  agree to come, but he did agree to see a counselor or psychiatrist for  an assessment and he was willing to consider medications.  She was  encouraged by this, as it seemed that he has realized that he needs to  do something and address whatever is going on with him.  She was wanting  to be discharged.  She felt she was fully detoxed, and she will take it   from here.  Appointment with Dr. Sharl Ma.  There were no active  withdrawals.  She was looking forward to continuing to work sobriety  through [employer].   DISCHARGE DIAGNOSES:   AXIS I:  Alcohol dependence, attention deficit hyperactivity disorder,  depressive disorder, not otherwise specified.   AXIS II:  No diagnosis.   AXIS III:  Diabetes mellitus type 2, arterial hypertension,  hyperlipidemia.   AXIS IV:  Moderate.   AXIS V:  GAF on discharge 50-55.   DISCHARGE MEDICATIONS:  1. Glucophage XR 500 mg 2 daily.  2. Januvia 100 mg per day.  3. Prinivil 40 mg per day.  4. Lyrica 100 mg twice a day.  5. Pursue Librium taper, 25 one twice a day on January 17, 2008 and      Librium 25 one daily on February 5th, 6th, and 7th.  6. Ambien 10 at bedtime for sleep.  7. Ritalin 20 mg 3 times a day as per Dr. Sharl Ma.  Follow up with Dr.      Sharl Ma on her regular appointment for January 17, 2008.      Geoffery Lyons, M.D.  Electronically Signed     IL/MEDQ  D:  02/12/2008  T:  02/13/2008  Job:  [id number]

## 2011-04-30 NOTE — Discharge Summary (Signed)
[name], [name] NO.:  [id number]   MEDICAL RECORD NO.:  [id number]          PATIENT TYPE:  IPS   LOCATION:  0503                          FACILITY:  [facility]   PHYSICIAN:  Jasmine Pang, M.D. DATE OF BIRTH:  [birth year]   DATE OF ADMISSION:  03/14/2007  DATE OF DISCHARGE:  03/17/2007                               DISCHARGE SUMMARY   IDENTIFYING INFORMATION:  [name] white female who was admitted on  a voluntary basis on March 14, 2007.   HISTORY OF PRESENT ILLNESS:  The patient presented in the ED with  alcohol level of 236 and requested detox.  She denied any suicidal  thoughts.  She states she has been drinking too much and there has been  family conflicts.  She wants to be detoxed.  She tried to cut down on  her own but could not.  Her drinking is causing conflict with her  husband.  She states she was drinking wine excessively.  She used to see  Dr. Evelene Croon frequently and now approximately every 6 months.  She used to  work as a Engineer, civil (consulting). This is the second [facility] admission for this patient, last  one was July 1-4, 2007.  She has been hospitalized for detox in 1988 and  1993.  She has a history of ADHD treated with methylphenidate, 20 mg  three times daily.  She has a history of polypharmacy overdose in 2004.  She took Effexor XR in 2007 then stopped.  Her son has completed suicide  in the past by gunshot wound.  She has dyslipidemia, diabetes mellitus 2  on oral agents, and ? fibromyalgia.   She is currently on:  1. Januvia 100 mg daily.  2. Metformin XL 1000 mg at bedtime.  3. Lisinopril 10 mg at bedtime.  4. Lyrica was just discontinued.  5. Ritalin 20 mg up to q.i.d.  6. Ativan 0.25 mg p.o. p.r.n.   Diazepam 10 mg in the ED and Ativan 2 mg IM to prevent withdrawal.   The patient is allergic to SULFA DRUGS AND CODEINE.   PHYSICAL FINDINGS:  Complete physical exam was done in the ED.  There  were no acute medical problems.  She was in no physical  distress.   ADMISSION LABORATORIES:  Were done in the ED and reviewed by the ED  physician.  Hepatic profile was grossly within normal limits except for  an elevated SGOT of 38 (0 to 37) and an elevated SGPT of 59 (0 to 35).  The TSH was slightly elevated at 8.042 (0.35-5.5).   HOSPITAL COURSE:  Upon admission, the patient was continued on metformin  2000 mg p.o. daily, metformin XR 1000 mg p.o. q.h.s., lisinopril 10 mg  p.o. daily.  The patient was started on Librium detox protocol for  alcohol detox.  On March 14, 2007, the patient was started on Ritalin 20  mg q. 7 a.m., 1:00 p.m., and 3:00 p.m.  The metformin was discontinued.  She was continued on metformin XL 1000 mg p.o. q.h.s., lisinopril 10 mg  daily, Januvia 100 mg daily, Lipitor 10  mg.  On March 14, 2007, Lipitor  was cancelled secondary to elevated liver profile and March 15, 2007, due  to increased blood pressure, lisinopril was increased to 20 mg daily.  On March 15, 2007, the patient was placed on trazodone 50 mg p.o. q.h.s.  p.r.n. insomnia.  The patient tolerated her medications well with no  significant side effects.   Upon first meeting with the patient, she discussed her history including  her son committing suicide 4 years ago and father dying in January 2008.  She reports these losses as her stressors.  She was abusing  benzodiazepines and alcohol and has been trying to detox herself at home  for the past 4 days.  She was anxious during the admission but denied  suicidal or homicidal ideation, denied auditory or visual  hallucinations.  On March 15, 2007, the patient stated she had had  elevated blood pressure but her lisinopril was increased to 20 mg p.o.  q.a.m.  Appetite was good.  Mood was dysphoric, anxious.  Affect wide  range.  No suicidal or homicidal ideation.  No auditory or visual  hallucination.  She talked about her son's suicide which was very  difficult for her.  She talked about wanting to go to [facility].   On March 16, 2007, the patient stated she felt better and ready for discharge the  next day.  She was having difficulty falling asleep but did take  trazodone which helped.  She met with the [facility] counselor and was  looking forward to starting that.  Mood was stable.  There was no  suicidal or homicidal ideation.  She discussed her fibromyalgia and  dealing with this pain.  Plan was to discharge the next day.   On March 17, 2007, mental status had improved markedly from admission.  The patient was tolerating her Librium protocol well with no symptoms of  anxiety or withdrawal.  She was friendly, cooperative, conversant with  good eye contact.  Speech was normal rate and flow.  Psychomotor  activity was within normal limits.  Mood was euthymic.  Affect wide  range.  There was no suicidal or homicidal ideation.  No thoughts of  self injurious behavior.  No auditory or visual hallucinations.  No  paranoia or delusions.  Thought processes were logical and goal  directed.  Thought content - no predominant theme.  It was felt the  patient was safe to be discharged today and was going to start [facility]  program on Monday April 7th at 10:30.   DISCHARGE DIAGNOSES:  AXIS I:  1. Alcohol abuse, alcohol dependence.  2. Benzodiazepine abuse.  3. Attention deficit hyperactivity disorder, inattentive type.  AXIS II: None.  AXIS III:  1. Diabetes mellitus 2, controlled with oral agents.  2. Dyslipidemia.  3. Rule out alcohol-induced transaminases.  AXIS IV: Moderate (problems with primary support group, conflict with  husband due to her drinking, medical problems, burden of psychiatric  illness).  AXIS V: Global Assessment of Functioning (GAF) upon discharge was 50.  Global Assessment of Functioning (GAF) upon admission was 39.  Global  Assessment of Functioning (GAF) highest past year was 69.  DISCHARGE PLANS:  There were no specific activity level or dietary  restrictions.   POST HOSPITAL CARE  PLANS:  The patient will attend the [facility]  starting Monday, April 7th at 10:30 a.m.   DISCHARGE MEDICATIONS:  1. Lisinopril 20 mg q.a.m. (This was increased from 10 mg.)  2. Glucophage  XR 1000 mg at bedtime.  3. Januvia 100 mg daily.  4. Trazodone 50 mg at bedtime p.r.n. insomnia.  5. Methylphenidate 20 mg q.a.m., noon, and 3 p.m.   The patient was also instructed to see Dr. Oliver Barre for followup of  blood pressure and slightly elevated TSH at 8.042 (0.35-5.5).      Jasmine Pang, M.D.  Electronically Signed     BHS/MEDQ  D:  03/17/2007  T:  03/17/2007  Job:  [id number]

## 2011-04-30 NOTE — H&P (Signed)
   [name], [name] NO.:  [id number]   MEDICAL RECORD NO.:  [id number]                   PATIENT TYPE:  INP   LOCATION:  4702                                 FACILITY:  [facility]   PHYSICIAN:  Gordy Savers, M.D. [facility]      DATE OF BIRTH:  1946/01/30   DATE OF ADMISSION:  08/14/2003  DATE OF DISCHARGE:                                HISTORY & PHYSICAL   HISTORY OF PRESENT ILLNESS:  The patient is a 65 year old white female with  a long history of major depression.  She has been followed for psychiatric  care by Dr. Helane Rima.  The patient presented to the emergency room following a  multi-drug overdose and is now admitted for further evaluation and  treatment.   PAST MEDICAL HISTORY:  As mentioned, has a history of major depression as  well as alcohol use.  According to the records, she has a history of seizure  disorder and a prior history of a suicide attempt.  Medications she took in  a suicide gesture include Ambien, Effexor, methylphenidate, Vioxx, and  hydralazine of unknown quantities.  The patient denies any other medical  illnesses.   SOCIAL HISTORY:  She is a retired Char[employer] fundraiser.  Smokes, according to the patient, two  cigarettes daily.  She has a history of depression which has been  exacerbated by the death of a son recently.   REVIEW OF SYSTEMS:  Exam otherwise noncontributory.  Denies any active  medical problems.   PHYSICAL EXAMINATION:  VITAL SIGNS:  Stable.  Blood pressure was 124/70,  pulse 70, respiratory rate 12, afebrile.  GENERAL:  Well-developed, mildly overweight white female who was lethargic  but responded appropriately to questions.  HEAD AND NECK:  No evidence of trauma. Pupillary responses were normal.  ENT  negative.  Mouth was stained with charcoal.  Neck had no thyroid enlargement  or adenopathy.  CHEST: Clear.  CARDIOVASCULAR:  S1 and S2 normal.  No murmurs or gallops.  ABDOMEN:  Benign.  No organomegaly.  EXTREMITIES:   Full peripheral pulses.   LABORATORY DATA:  EKG was reviewed and unremarkable except for a prolonged  QT interval.   Laboratory studies are pending including a urine drug screen.   IMPRESSION:  1. Multi-drug overdose with suicide gesture.  2. Major depression.    DISPOSITION:  The patient will be admitted to the hospital for further  evaluation.  She will be admitted to a telemetry setting, and repeat EKG  will be checked in the morning.                                                Gordy Savers, M.D. [facility]    PFK/MEDQ  D:  08/14/2003  T:  08/15/2003  Job:  [id number]

## 2011-04-30 NOTE — Discharge Summary (Signed)
   NAMEHAJA, CREGO NO.:  [id number]   MEDICAL RECORD NO.:  [id number]                   PATIENT TYPE:  INP   LOCATION:  4702                                 FACILITY:  [facility]   PHYSICIAN:  Rene Paci, M.D. [facility]          DATE OF BIRTH:  May 11, 1946   DATE OF ADMISSION:  08/14/2003  DATE OF DISCHARGE:  08/16/2003                                 DISCHARGE SUMMARY   DISCHARGE DIAGNOSES:  1. Attempted suicide with multidrug overdose and alcohol intoxication,     status post psychiatric evaluation.  Recommend psychiatric inpatient but     patient competent and refused and did so.  Outpatient followup with her     psychiatrist, Dr. Milagros Evener this week.  2. History of depression.  3. Alcohol intoxication.   DISCHARGE MEDICATIONS:  1. Effexor XR 150 mg p.o. nightly.  2. Ambien 10 mg p.o. nightly p.r.n.  3. Reglan 10 mg q.a.c./h.s.  4. Levsin, Zyrtec and Atarax as needed as prior to admission.   DISPOSITION:  Home with outpatient followup with her psychiatrist as  arranged by Dr. Antonietta Breach.   CONDITION ON DISCHARGE:  Medically stable.   BRIEF HOSPITAL COURSE:  PROBLEM #1 - SUICIDE ATTEMPT:  The patient is a 65-  year-old woman with history of depression who attempted suicide, day of  admission, by ingesting her Ambien, Effexor and Ritalin with large amounts  of alcohol.  She was aware enough to call 911 for evaluation and she was  brought to the emergency room for further observation.  After speaking with  Poison Control, they recommended 24-hour observation on telemetry to monitor  for any drug effects and none could be seen.  As her intoxication wore off,  the patient was sorry for the attempts at suicide and said she would not do  so again.  She agreed to an inpatient psychiatric evaluation, which was  performed on the day of discharge.  Dr. Jeanie Sewer felt strongly that the  patient would benefit for a psychiatric inpatient dual  treatment with  therapy but the patient had recovered from her intoxication and was  competent to refuse.  She will follow up with her outpatient psychiatrist as  scheduled previously.  Medications are as listed above.                                                Rene Paci, M.D. [facility]    VL/MEDQ  D:  10/23/2003  T:  10/24/2003  Job:  [id number]

## 2011-07-14 ENCOUNTER — Emergency Department (HOSPITAL_COMMUNITY): Payer: 59

## 2011-07-14 ENCOUNTER — Emergency Department (HOSPITAL_COMMUNITY)
Admission: EM | Admit: 2011-07-14 | Discharge: 2011-07-15 | Disposition: A | Discharge status: Home / Self Care | Payer: 59 | Attending: Emergency Medicine | Admitting: Emergency Medicine

## 2011-07-14 DIAGNOSIS — F29 Unspecified psychosis not due to a substance or known physiological condition: Secondary | ICD-10-CM | POA: Insufficient documentation

## 2011-07-14 DIAGNOSIS — R4789 Other speech disturbances: Secondary | ICD-10-CM | POA: Insufficient documentation

## 2011-07-14 DIAGNOSIS — F1010 Alcohol abuse, uncomplicated: Secondary | ICD-10-CM | POA: Insufficient documentation

## 2011-07-14 LAB — COMPREHENSIVE METABOLIC PANEL
ALT: 19 U/L (ref 0–35)
AST: 17 U/L (ref 0–37)
Albumin: 4.3 g/dL (ref 3.5–5.2)
Alkaline Phosphatase: 77 U/L (ref 39–117)
BUN: 18 mg/dL (ref 6–23)
CO2: 24 mEq/L (ref 19–32)
Calcium: 9.2 mg/dL (ref 8.4–10.5)
Chloride: 95 mEq/L — ABNORMAL LOW (ref 96–112)
Creatinine, Ser: 0.61 mg/dL (ref 0.50–1.10)
GFR calc Af Amer: >60 mL/min (ref >60)
GFR calc non Af Amer: >60 mL/min (ref >60)
Glucose, Bld: 210 mg/dL — ABNORMAL HIGH (ref 70–99)
Potassium: 4 mEq/L (ref 3.5–5.1)
Sodium: 138 mEq/L (ref 135–145)
Total Bilirubin: 0.3 mg/dL (ref 0.3–1.2)
Total Protein: 8 g/dL (ref 6.0–8.3)

## 2011-07-14 LAB — DIFFERENTIAL
Basophils Absolute: 0.1 10*3/uL (ref 0.0–0.1)
Basophils Relative: 0 % (ref 0–1)
Eosinophils Absolute: 0 10*3/uL (ref 0.0–0.7)
Eosinophils Relative: 0 % (ref 0–5)
Lymphocytes Relative: 28 % (ref 12–46)
Lymphs Abs: 3.5 10*3/uL (ref 0.7–4.0)
Monocytes Absolute: 0.7 10*3/uL (ref 0.1–1.0)
Monocytes Relative: 6 % (ref 3–12)
Neutro Abs: 8.3 10*3/uL — ABNORMAL HIGH (ref 1.7–7.7)
Neutrophils Relative %: 66 % (ref 43–77)

## 2011-07-14 LAB — ETHANOL
Alcohol, Ethyl (B): 321 mg/dL — ABNORMAL HIGH (ref 0–11)
Alcohol, Ethyl (B): 68 mg/dL — ABNORMAL HIGH (ref 0–11)

## 2011-07-14 LAB — CBC
HCT: 42.5 % (ref 36.0–46.0)
Hemoglobin: 14.2 g/dL (ref 12.0–15.0)
MCH: 29.8 pg (ref 26.0–34.0)
MCHC: 33.4 g/dL (ref 30.0–36.0)
MCV: 89.1 fL (ref 78.0–100.0)
Platelets: 328 10*3/uL (ref 150–400)
RBC: 4.77 MIL/uL (ref 3.87–5.11)
RDW: 14.9 % (ref 11.5–15.5)
WBC: 12.6 10*3/uL — ABNORMAL HIGH (ref 4.0–10.5)

## 2011-07-14 LAB — GLUCOSE, CAPILLARY: Glucose-Capillary: 168 mg/dL — ABNORMAL HIGH (ref 70–99)

## 2011-07-14 LAB — RAPID URINE DRUG SCREEN, HOSP PERFORMED
Amphetamines: NOT DETECTED
Barbiturates: NOT DETECTED
Benzodiazepines: POSITIVE — AB
Cocaine: NOT DETECTED
Opiates: NOT DETECTED
Tetrahydrocannabinol: NOT DETECTED

## 2011-07-15 LAB — GLUCOSE, CAPILLARY: Glucose-Capillary: 152 mg/dL — ABNORMAL HIGH (ref 70–99)

## 2011-07-26 ENCOUNTER — Emergency Department (HOSPITAL_COMMUNITY)
Admission: EM | Admit: 2011-07-26 | Discharge: 2011-07-26 | Disposition: A | Discharge status: Home / Self Care | Payer: 59 | Attending: Emergency Medicine | Admitting: Emergency Medicine

## 2011-07-26 DIAGNOSIS — E119 Type 2 diabetes mellitus without complications: Secondary | ICD-10-CM | POA: Insufficient documentation

## 2011-07-26 DIAGNOSIS — I10 Essential (primary) hypertension: Secondary | ICD-10-CM | POA: Insufficient documentation

## 2011-07-26 DIAGNOSIS — F1010 Alcohol abuse, uncomplicated: Secondary | ICD-10-CM | POA: Insufficient documentation

## 2011-07-26 LAB — COMPREHENSIVE METABOLIC PANEL
ALT: 18 U/L (ref 0–35)
AST: 18 U/L (ref 0–37)
Albumin: 3.8 g/dL (ref 3.5–5.2)
Alkaline Phosphatase: 86 U/L (ref 39–117)
BUN: 13 mg/dL (ref 6–23)
CO2: 26 mEq/L (ref 19–32)
Calcium: 8.7 mg/dL (ref 8.4–10.5)
Chloride: 101 mEq/L (ref 96–112)
Creatinine, Ser: 0.57 mg/dL (ref 0.50–1.10)
GFR calc Af Amer: >60 mL/min (ref >60)
GFR calc non Af Amer: >60 mL/min (ref >60)
Glucose, Bld: 162 mg/dL — ABNORMAL HIGH (ref 70–99)
Potassium: 4.1 mEq/L (ref 3.5–5.1)
Sodium: 138 mEq/L (ref 135–145)
Total Bilirubin: 0.2 mg/dL — ABNORMAL LOW (ref 0.3–1.2)
Total Protein: 7 g/dL (ref 6.0–8.3)

## 2011-07-26 LAB — DIFFERENTIAL
Basophils Absolute: 0.1 10*3/uL (ref 0.0–0.1)
Basophils Relative: 1 % (ref 0–1)
Eosinophils Absolute: 0.1 10*3/uL (ref 0.0–0.7)
Eosinophils Relative: 2 % (ref 0–5)
Lymphocytes Relative: 51 % — ABNORMAL HIGH (ref 12–46)
Lymphs Abs: 2.4 10*3/uL (ref 0.7–4.0)
Monocytes Absolute: 0.4 10*3/uL (ref 0.1–1.0)
Monocytes Relative: 8 % (ref 3–12)
Neutro Abs: 1.8 10*3/uL (ref 1.7–7.7)
Neutrophils Relative %: 38 % — ABNORMAL LOW (ref 43–77)

## 2011-07-26 LAB — CBC
HCT: 35.6 % — ABNORMAL LOW (ref 36.0–46.0)
Hemoglobin: 11.7 g/dL — ABNORMAL LOW (ref 12.0–15.0)
MCH: 30.2 pg (ref 26.0–34.0)
MCHC: 32.9 g/dL (ref 30.0–36.0)
MCV: 91.8 fL (ref 78.0–100.0)
Platelets: 244 10*3/uL (ref 150–400)
RBC: 3.88 MIL/uL (ref 3.87–5.11)
RDW: 15.7 % — ABNORMAL HIGH (ref 11.5–15.5)
WBC: 4.7 10*3/uL (ref 4.0–10.5)

## 2011-07-26 LAB — RAPID URINE DRUG SCREEN, HOSP PERFORMED
Amphetamines: NOT DETECTED
Barbiturates: NOT DETECTED
Benzodiazepines: POSITIVE — AB
Cocaine: NOT DETECTED
Opiates: NOT DETECTED
Tetrahydrocannabinol: NOT DETECTED

## 2011-07-26 LAB — ETHANOL: Alcohol, Ethyl (B): 193 mg/dL — ABNORMAL HIGH (ref 0–11)

## 2011-08-20 ENCOUNTER — Encounter: Payer: Self-pay | Admitting: *Deleted

## 2011-08-20 ENCOUNTER — Emergency Department (HOSPITAL_COMMUNITY)
Admission: EM | Admit: 2011-08-20 | Discharge: 2011-08-21 | Disposition: A | Discharge status: Home / Self Care | Payer: 59 | Attending: Emergency Medicine | Admitting: Emergency Medicine

## 2011-08-20 DIAGNOSIS — F17200 Nicotine dependence, unspecified, uncomplicated: Secondary | ICD-10-CM | POA: Insufficient documentation

## 2011-08-20 DIAGNOSIS — F1010 Alcohol abuse, uncomplicated: Secondary | ICD-10-CM

## 2011-08-20 DIAGNOSIS — I10 Essential (primary) hypertension: Secondary | ICD-10-CM | POA: Insufficient documentation

## 2011-08-20 DIAGNOSIS — E119 Type 2 diabetes mellitus without complications: Secondary | ICD-10-CM | POA: Insufficient documentation

## 2011-08-20 HISTORY — DX: Alcohol abuse, uncomplicated: F10.10

## 2011-08-20 HISTORY — DX: Essential (primary) hypertension: I10

## 2011-08-20 LAB — COMPREHENSIVE METABOLIC PANEL
ALT: 11 U/L (ref 0–35)
AST: 14 U/L (ref 0–37)
Albumin: 4 g/dL (ref 3.5–5.2)
Alkaline Phosphatase: 85 U/L (ref 39–117)
BUN: 17 mg/dL (ref 6–23)
CO2: 23 mEq/L (ref 19–32)
Calcium: 8.9 mg/dL (ref 8.4–10.5)
Chloride: 101 mEq/L (ref 96–112)
Creatinine, Ser: 0.56 mg/dL (ref 0.50–1.10)
GFR calc Af Amer: >60 mL/min (ref >60)
GFR calc non Af Amer: >60 mL/min (ref >60)
Glucose, Bld: 111 mg/dL — ABNORMAL HIGH (ref 70–99)
Potassium: 4.6 mEq/L (ref 3.5–5.1)
Sodium: 138 mEq/L (ref 135–145)
Total Bilirubin: 0.2 mg/dL — ABNORMAL LOW (ref 0.3–1.2)
Total Protein: 7.4 g/dL (ref 6.0–8.3)

## 2011-08-20 LAB — ETHANOL: Alcohol, Ethyl (B): 347 mg/dL — ABNORMAL HIGH (ref 0–11)

## 2011-08-20 LAB — ACETAMINOPHEN LEVEL: Acetaminophen (Tylenol), Serum: <15 ug/mL (ref 10–30)

## 2011-08-20 LAB — CBC
HCT: 39.7 % (ref 36.0–46.0)
Hemoglobin: 13.2 g/dL (ref 12.0–15.0)
MCH: 32.4 pg (ref 26.0–34.0)
MCHC: 33.2 g/dL (ref 30.0–36.0)
MCV: 97.3 fL (ref 78.0–100.0)
Platelets: 284 10*3/uL (ref 150–400)
RBC: 4.08 MIL/uL (ref 3.87–5.11)
RDW: 18.4 % — ABNORMAL HIGH (ref 11.5–15.5)
WBC: 6.3 10*3/uL (ref 4.0–10.5)

## 2011-08-20 LAB — DIFFERENTIAL
Basophils Absolute: 0.1 10*3/uL (ref 0.0–0.1)
Basophils Relative: 1 % (ref 0–1)
Eosinophils Absolute: 0.1 10*3/uL (ref 0.0–0.7)
Eosinophils Relative: 1 % (ref 0–5)
Lymphocytes Relative: 54 % — ABNORMAL HIGH (ref 12–46)
Lymphs Abs: 3.4 10*3/uL (ref 0.7–4.0)
Monocytes Absolute: 0.6 10*3/uL (ref 0.1–1.0)
Monocytes Relative: 9 % (ref 3–12)
Neutro Abs: 2.2 10*3/uL (ref 1.7–7.7)
Neutrophils Relative %: 35 % — ABNORMAL LOW (ref 43–77)

## 2011-08-20 LAB — LIPASE, BLOOD: Lipase: 32 U/L (ref 11–59)

## 2011-08-20 LAB — RAPID URINE DRUG SCREEN, HOSP PERFORMED
Amphetamines: NOT DETECTED
Barbiturates: NOT DETECTED
Benzodiazepines: POSITIVE — AB
Cocaine: NOT DETECTED
Opiates: NOT DETECTED
Tetrahydrocannabinol: NOT DETECTED

## 2011-08-20 LAB — SALICYLATE LEVEL: Salicylate Lvl: <2 mg/dL — ABNORMAL LOW (ref 2.8–20.0)

## 2011-08-20 MED ORDER — LISINOPRIL 5 MG PO TABS
5.0000 mg | ORAL_TABLET | Freq: Every day | ORAL | Status: DC
  Administered 2011-08-21: 5 mg via ORAL
  Filled 2011-08-20: qty 1

## 2011-08-20 MED ORDER — METFORMIN HCL 500 MG PO TABS
750.0000 mg | ORAL_TABLET | Freq: Once | ORAL | Status: AC
  Administered 2011-08-20: 750 mg via ORAL
  Filled 2011-08-20: qty 2

## 2011-08-20 MED ORDER — ONDANSETRON HCL 4 MG PO TABS: 4.0000 mg | ORAL_TABLET | Freq: Three times a day (TID) | ORAL | Status: DC | PRN

## 2011-08-20 MED ORDER — LORAZEPAM 1 MG PO TABS
2.0000 mg | ORAL_TABLET | ORAL | Status: DC | PRN
  Administered 2011-08-21 (×3): 2 mg via ORAL
  Filled 2011-08-20 (×3): qty 2

## 2011-08-20 MED ORDER — EZETIMIBE 10 MG PO TABS
10.0000 mg | ORAL_TABLET | Freq: Every day | ORAL | Status: DC
  Administered 2011-08-21: 10 mg via ORAL
  Filled 2011-08-20: qty 1

## 2011-08-20 MED ORDER — ONDANSETRON 8 MG PO TBDP
8.0000 mg | ORAL_TABLET | Freq: Once | ORAL | Status: AC
  Administered 2011-08-20: 8 mg via ORAL
  Filled 2011-08-20: qty 1

## 2011-08-20 MED ORDER — LORAZEPAM 1 MG PO TABS
2.0000 mg | ORAL_TABLET | Freq: Once | ORAL | Status: AC
  Administered 2011-08-20: 2 mg via ORAL
  Filled 2011-08-20 (×2): qty 1

## 2011-08-20 NOTE — ED Notes (Signed)
Pt states, "I've been drinking too much" x 2 months; states drinks 4-5 bottles of wine daily; also states she overmedicates on Rx meds (?benzodiazepenes)

## 2011-08-20 NOTE — ED Notes (Signed)
Pt states that she wants help to detox from alcohol. States that she drinks 4- 12 little bottles of wine per day. States that she drank the last one on the way here. Pt was seen by Dr. Sherwood Gambler and told to come to ED. Pt states that she has been to rehab before and didn't want to try to do it by herself. Pt smells of ETOH. Husband at bedside.

## 2011-08-20 NOTE — ED Provider Notes (Signed)
History     CSN: [id number] Arrival date & time: 08/20/2011  5:54 PM Pt seen at 1856  Chief Complaint  Patient presents with  . Alcohol Intoxication   Patient is a 66 y.o. female presenting with intoxication. The history is provided by the patient and the spouse.  Alcohol Intoxication This is a recurrent problem. The current episode started more than 1 week ago. The problem occurs daily. Pertinent negatives include no abdominal pain and no shortness of breath. The symptoms are aggravated by nothing. The symptoms are relieved by nothing.  pt here for etoh abuse and requesting detox Reports she has been drinking daily for at least two months, and she reports long time h/o etoh abuse.  She reports increasing etoh abuse last spring worse in past two months Also reports h/o withdrawal, but denies h/o seizure She has no other complaints She has taken 4-5 hydrocodone pills today for body pain, no other co-ingestants Denies SI at this time   Past Medical History  Diagnosis Date  . Alcohol abuse   . Diabetes mellitus   . Hypertension     Past Surgical History  Procedure Date  . Cesarean section   . Tonsillectomy   . Bunionectomy     No family history on file.  History  Substance Use Topics  . Smoking status: Current Some Day Smoker  . Smokeless tobacco: Not on file  . Alcohol Use: Yes     4-5 bottles of wine per day    OB History    Grav Para Term Preterm Abortions TAB SAB Ect Mult Living                  Review of Systems  Respiratory: Negative for shortness of breath.   Gastrointestinal: Negative for abdominal pain.  All other systems reviewed and are negative.    Physical Exam  BP 120/51  Pulse 87  Temp(Src) 98.8 F (37.1 C) (Oral)  Resp 20  Ht 5' 3.5" (1.613 m)  Wt 155 lb (70.308 kg)  BMI 27.03 kg/m2  SpO2 90%  Physical Exam  CONSTITUTIONAL: Well developed/well nourished HEAD AND FACE: Normocephalic/atraumatic EYES: EOMI/PERRL ENMT: Mucous membranes  moist NECK: supple no meningeal signs CV: S1/S2 noted, no murmurs/rubs/gallops noted LUNGS: Lungs are clear to auscultation bilaterally, no apparent distress ABDOMEN: soft, nontender, no rebound or guarding NEURO: Pt is awake/alert, moves all extremitiesx4, no significant tremor noted EXTREMITIES: pulses normal, full ROM SKIN: warm, color normal PSYCH: no abnormalities of mood noted   ED Course  Procedures  MDM Nursing notes reviewed and considered in documentation   Pt stable at this time Denies SI D/w ACT for possible detox, but I explained to patient that we may not be able to place her but will start detox here in ED    BP 120/51  Pulse 87  Temp(Src) 98.8 F (37.1 C) (Oral)  Resp 20  Ht 5' 3.5" (1.613 m)  Wt 155 lb (70.308 kg)  BMI 27.03 kg/m2  SpO2 90% Results for orders placed during the hospital encounter of 08/20/11  COMPREHENSIVE METABOLIC PANEL      Component Value Range   Sodium 138  135 - 145 (mEq/L)   Potassium 4.6  3.5 - 5.1 (mEq/L)   Chloride 101  96 - 112 (mEq/L)   CO2 23  19 - 32 (mEq/L)   Glucose, Bld 111 (*) 70 - 99 (mg/dL)   BUN 17  6 - 23 (mg/dL)   Creatinine, Ser 4.09  0.50 -  1.10 (mg/dL)   Calcium 8.9  8.4 - 40.9 (mg/dL)   Total Protein 7.4  6.0 - 8.3 (g/dL)   Albumin 4.0  3.5 - 5.2 (g/dL)   AST 14  0 - 37 (U/L)   ALT 11  0 - 35 (U/L)   Alkaline Phosphatase 85  39 - 117 (U/L)   Total Bilirubin 0.2 (*) 0.3 - 1.2 (mg/dL)   GFR calc non Af Amer >60  >60 (mL/min)   GFR calc Af Amer >60  >60 (mL/min)  LIPASE, BLOOD      Component Value Range   Lipase 32  11 - 59 (U/L)  CBC      Component Value Range   WBC 6.3  4.0 - 10.5 (K/uL)   RBC 4.08  3.87 - 5.11 (MIL/uL)   Hemoglobin 13.2  12.0 - 15.0 (g/dL)   HCT 81.1  91.4 - 78.2 (%)   MCV 97.3  78.0 - 100.0 (fL)   MCH 32.4  26.0 - 34.0 (pg)   MCHC 33.2  30.0 - 36.0 (g/dL)   RDW 95.6 (*) 21.3 - 15.5 (%)   Platelets 284  150 - 400 (K/uL)  DIFFERENTIAL      Component Value Range   Neutrophils  Relative 35 (*) 43 - 77 (%)   Neutro Abs 2.2  1.7 - 7.7 (K/uL)   Lymphocytes Relative 54 (*) 12 - 46 (%)   Lymphs Abs 3.4  0.7 - 4.0 (K/uL)   Monocytes Relative 9  3 - 12 (%)   Monocytes Absolute 0.6  0.1 - 1.0 (K/uL)   Eosinophils Relative 1  0 - 5 (%)   Eosinophils Absolute 0.1  0.0 - 0.7 (K/uL)   Basophils Relative 1  0 - 1 (%)   Basophils Absolute 0.1  0.0 - 0.1 (K/uL)  ETHANOL      Component Value Range   Alcohol, Ethyl (B) 347 (*) 0 - 11 (mg/dL)  ACETAMINOPHEN LEVEL      Component Value Range   Acetaminophen (Tylenol), Serum <15.0  10 - 30 (ug/mL)  SALICYLATE LEVEL      Component Value Range   Salicylate Lvl <2.0 (*) 2.8 - 20.0 (mg/dL)      Joya Gaskins, MD 08/20/11 2034

## 2011-08-20 NOTE — ED Notes (Signed)
Pt stating much calmer after ativan and meal resting in bed in room, no noted distress

## 2011-08-21 LAB — ETHANOL: Alcohol, Ethyl (B): <11 mg/dL (ref 0–11)

## 2011-08-21 LAB — GLUCOSE, CAPILLARY: Glucose-Capillary: 114 mg/dL — ABNORMAL HIGH (ref 70–99)

## 2011-08-21 MED ORDER — ONDANSETRON 8 MG PO TBDP
8.0000 mg | ORAL_TABLET | Freq: Once | ORAL | Status: AC
  Administered 2011-08-21: 8 mg via ORAL
  Filled 2011-08-21: qty 1

## 2011-08-21 NOTE — ED Notes (Signed)
Pt stating shw was shaky and was given prn ativan

## 2011-08-21 NOTE — ED Notes (Signed)
Pt given lunch tray.

## 2011-08-21 NOTE — ED Notes (Signed)
Pt c/o stomach cramping and diarrhea, requesting something and also requesting additional dose of ativan,

## 2011-08-21 NOTE — ED Notes (Signed)
Spoke with [facility] intake who advises that their dr. Has looked at pt's evaluation but is "tied up" at the present, advised that he will return call to [facility] in a few minutes

## 2011-08-21 NOTE — ED Notes (Signed)
ETOH level faxed as requested.

## 2011-08-21 NOTE — ED Notes (Signed)
Pt states that the " being shakey " has improved, pt sleeping will arouse with verbal stimuli

## 2011-08-21 NOTE — ED Notes (Signed)
Spoke with Beth at [facility] who advised that they are still waiting to hear from their DR. On acceptance of pt at [facility]. Beth does advise that they have female beds if pt is accepted.

## 2011-08-21 NOTE — ED Notes (Signed)
No detox beds available Vitals appropriate No signs of signficant withdrawal Stable for d/c Pt agreeable  Joya Gaskins, MD 08/21/11 1901

## 2011-08-21 NOTE — ED Notes (Signed)
CIWA assessment done, score of 3

## 2011-08-21 NOTE — ED Notes (Signed)
[name] at [facility] called to check to see what Pt Glucose level was this am and to request a repeat ETOH, and wants it faxed to them upon completion.  Pts nurse informed.

## 2011-08-21 NOTE — ED Notes (Signed)
[name] from [facility] returned call, advised that they could not guarantee pt a bed at present time, pt still waiting for placement, dinner tray given to pt, pt states that she is feeling better,

## 2011-08-21 NOTE — ED Notes (Signed)
Repeat Etoh faxed to [facility].

## 2011-08-21 NOTE — ED Notes (Signed)
Pt c/o being "shakey" , pt medicated with ativan per prn orders

## 2011-08-23 ENCOUNTER — Emergency Department (HOSPITAL_COMMUNITY)
Admission: EM | Admit: 2011-08-23 | Discharge: 2011-08-25 | Disposition: A | Discharge status: Home / Self Care | Payer: 59 | Attending: Emergency Medicine | Admitting: Emergency Medicine

## 2011-08-23 DIAGNOSIS — I10 Essential (primary) hypertension: Secondary | ICD-10-CM | POA: Insufficient documentation

## 2011-08-23 DIAGNOSIS — E119 Type 2 diabetes mellitus without complications: Secondary | ICD-10-CM | POA: Insufficient documentation

## 2011-08-23 DIAGNOSIS — Z79899 Other long term (current) drug therapy: Secondary | ICD-10-CM | POA: Insufficient documentation

## 2011-08-23 DIAGNOSIS — F1010 Alcohol abuse, uncomplicated: Secondary | ICD-10-CM | POA: Insufficient documentation

## 2011-08-23 DIAGNOSIS — R109 Unspecified abdominal pain: Secondary | ICD-10-CM | POA: Insufficient documentation

## 2011-08-23 LAB — CBC
HCT: 41.5 % (ref 36.0–46.0)
Hemoglobin: 14.2 g/dL (ref 12.0–15.0)
MCH: 32.7 pg (ref 26.0–34.0)
MCHC: 34.2 g/dL (ref 30.0–36.0)
MCV: 95.6 fL (ref 78.0–100.0)
Platelets: 260 10*3/uL (ref 150–400)
RBC: 4.34 MIL/uL (ref 3.87–5.11)
RDW: 17.3 % — ABNORMAL HIGH (ref 11.5–15.5)
WBC: 8.1 10*3/uL (ref 4.0–10.5)

## 2011-08-23 LAB — DIFFERENTIAL
Basophils Absolute: 0.1 10*3/uL (ref 0.0–0.1)
Basophils Relative: 1 % (ref 0–1)
Eosinophils Absolute: 0.1 10*3/uL (ref 0.0–0.7)
Eosinophils Relative: 1 % (ref 0–5)
Lymphocytes Relative: 42 % (ref 12–46)
Lymphs Abs: 3.4 10*3/uL (ref 0.7–4.0)
Monocytes Absolute: 0.5 10*3/uL (ref 0.1–1.0)
Monocytes Relative: 6 % (ref 3–12)
Neutro Abs: 4.1 10*3/uL (ref 1.7–7.7)
Neutrophils Relative %: 51 % (ref 43–77)

## 2011-08-23 LAB — COMPREHENSIVE METABOLIC PANEL
ALT: 13 U/L (ref 0–35)
AST: 19 U/L (ref 0–37)
Albumin: 4.5 g/dL (ref 3.5–5.2)
Alkaline Phosphatase: 83 U/L (ref 39–117)
BUN: 15 mg/dL (ref 6–23)
CO2: 26 mEq/L (ref 19–32)
Calcium: 10.1 mg/dL (ref 8.4–10.5)
Chloride: 99 mEq/L (ref 96–112)
Creatinine, Ser: 0.62 mg/dL (ref 0.50–1.10)
GFR calc Af Amer: >60 mL/min (ref >60)
GFR calc non Af Amer: >60 mL/min (ref >60)
Glucose, Bld: 160 mg/dL — ABNORMAL HIGH (ref 70–99)
Potassium: 4.8 mEq/L (ref 3.5–5.1)
Sodium: 140 mEq/L (ref 135–145)
Total Bilirubin: 0.2 mg/dL — ABNORMAL LOW (ref 0.3–1.2)
Total Protein: 8.3 g/dL (ref 6.0–8.3)

## 2011-08-23 LAB — RAPID URINE DRUG SCREEN, HOSP PERFORMED
Amphetamines: NOT DETECTED
Barbiturates: NOT DETECTED
Benzodiazepines: POSITIVE — AB
Cocaine: NOT DETECTED
Opiates: NOT DETECTED
Tetrahydrocannabinol: NOT DETECTED

## 2011-08-23 LAB — ETHANOL: Alcohol, Ethyl (B): 294 mg/dL — ABNORMAL HIGH (ref 0–11)

## 2011-08-24 LAB — GLUCOSE, CAPILLARY: Glucose-Capillary: 219 mg/dL — ABNORMAL HIGH (ref 70–99)

## 2011-08-25 ENCOUNTER — Inpatient Hospital Stay (HOSPITAL_COMMUNITY)
Admission: AD | Admit: 2011-08-25 | Discharge: 2011-08-27 | DRG: 897 | Disposition: A | Discharge status: Home / Self Care | Payer: 59 | Source: Ambulatory Visit | Attending: Psychiatry | Admitting: Psychiatry

## 2011-08-25 DIAGNOSIS — F329 Major depressive disorder, single episode, unspecified: Secondary | ICD-10-CM

## 2011-08-25 DIAGNOSIS — E119 Type 2 diabetes mellitus without complications: Secondary | ICD-10-CM

## 2011-08-25 DIAGNOSIS — K5732 Diverticulitis of large intestine without perforation or abscess without bleeding: Secondary | ICD-10-CM

## 2011-08-25 DIAGNOSIS — F3289 Other specified depressive episodes: Secondary | ICD-10-CM

## 2011-08-25 DIAGNOSIS — F1020 Alcohol dependence, uncomplicated: Principal | ICD-10-CM

## 2011-08-25 DIAGNOSIS — Z79899 Other long term (current) drug therapy: Secondary | ICD-10-CM

## 2011-08-25 DIAGNOSIS — Z6379 Other stressful life events affecting family and household: Secondary | ICD-10-CM

## 2011-08-25 DIAGNOSIS — IMO0001 Reserved for inherently not codable concepts without codable children: Secondary | ICD-10-CM

## 2011-08-25 LAB — GLUCOSE, CAPILLARY
Glucose-Capillary: 140 mg/dL — ABNORMAL HIGH (ref 70–99)
Glucose-Capillary: 153 mg/dL — ABNORMAL HIGH (ref 70–99)
Glucose-Capillary: 126 mg/dL — ABNORMAL HIGH (ref 70–99)

## 2011-08-26 LAB — GLUCOSE, CAPILLARY
Glucose-Capillary: 135 mg/dL — ABNORMAL HIGH (ref 70–99)
Glucose-Capillary: 143 mg/dL — ABNORMAL HIGH (ref 70–99)
Glucose-Capillary: 132 mg/dL — ABNORMAL HIGH (ref 70–99)

## 2011-08-26 NOTE — Assessment & Plan Note (Signed)
NAMETALAYIA, HJORT NO.:  [id number]  MEDICAL RECORD NO.:  [id number]  LOCATION:  0305                          FACILITY:  [facility]  PHYSICIAN:  Orson Aloe, MD       DATE OF BIRTH:  Jun 20, 1946  DATE OF ADMISSION:  08/25/2011 DATE OF DISCHARGE:                      PSYCHIATRIC ADMISSION ASSESSMENT   IDENTIFYING INFORMATION:  [name], [name].  This is a voluntary admission.  HISTORY OF PRESENT ILLNESS:  [name] presents requesting detox from alcohol.  She had relapsed and has been drinking alcohol fairly heavily and cannot stop on her own, gets dizzy, flushed, sweats, and presented with an alcohol level of 294 mg/dL.  She denies substance abuse.  She is followed by Dr. Evelene Croon for depression, but denies any suicidal thoughts. Is asking for help getting back off the alcohol.  PAST PSYCHIATRIC HISTORY:  Sixth [facility] admission since 2006.  [name] has a history of alcohol abuse and depression.  Has had a history of family issues and periods of sobriety up to several months.  She is currently followed as an outpatient by Dr. Milagros Evener, M.D., her outpatient psychiatrist for alcohol abuse and depression, which is managed with Lexapro.  SOCIAL HISTORY:  This is a single, [name] who previously had a bachelor's degree in nursing.  She has been married twice, has a daughter, and no current legal charges.  FAMILY HISTORY:  Both sides of her family with histories of alcoholism. Son completed suicide by gunshot wound in 2001.  MEDICAL PROBLEMS:  Primary care physician is at [facility], endocrinology department for diabetes, and Dr. Regino Schultze in [place].  Medical problems are diabetes mellitus type 2, hypertension, hyperlipidemia, fibromyalgia and diverticulitis.  CURRENT MEDICATIONS: 1. Adderall XR 30 mg 2 capsules q.a.m. 2. Lisinopril 5 mg daily. 3. Lexapro 10 mg daily. 4. Lomotil 1 tablet daily p.r.n. for loose  stools. 5. Omega-3  1 g p.o. 4 tablets q.h.s. 6. Bystolic 5 mg daily. 7. Metformin XR 750 mg 2 tablets q.h.s. 8. Zetia 10 mg 1 tablet daily. 9. Januvia 100 mg daily. 10.Co-Q-10 100 mg daily. 11.Hydrocodone/APAP 5/325 mg 1 tablet q.6 hours p.r.n. 12.Magnesium chloride over-the-counter (dose unknown) 1 tablet daily.  DRUG ALLERGIES:  SULFA, CODEINE AND LATEX.  POSITIVE PHYSICAL FINDINGS:  Physical exam was done in the emergency room and is noted in the record.  Initial alcohol level was 294.  Urine drug screen positive for benzodiazepines.  Normal chemistry, normal liver enzymes.  She weighs 78 kg and is 5 feet 4 inches tall, temperature 98, pulse 79, respirations 18, blood pressure 155/85.  A full physical exam is noted in the emergency room.  She did receive hydrocodone there for abdominal pain from her diverticulitis.  MENTAL STATUS EXAM:  Fully alert female, cooperative, no acute abdominal symptoms at this time, though she is having some loose stools.  In full contact with reality.  Speech is logical, coherent.  Thinking is goal directed.  Denying any suicidal thoughts.  Mood is neutral.  Affect congruent.  Skin is moist.  Appears to be having mild detox symptoms, continuing to have some loose stools today.  No evidence of delirium. Cognition is completely intact.  DIAGNOSES:  Axis I:  Alcohol dependence, depressive disorder not otherwise specified. Axis II:  Deferred. Axis III:  Fibromyalgia, diabetes mellitus type 2 controlled, hypertension, diverticulitis. Axis IV:  Deferred. Axis V:  Current 40, past year not known.  PLAN:  The plan is to voluntarily admit her with a goal of a safe detox in 4 days.  We started her on a Librium protocol to meet that goal, and right now we are going to change her Lomotil dosing to 1 tablet q.i.d. and 1 tablet up to four times daily as needed for loose stools and abdominal cramping.  We are going to hold off on her Adderall for the first  couple of days and we will consider resuming that after she makes a little bit of detox progress.     [name], N.P.   ______________________________ Orson Aloe, MD    MAS/MEDQ  D:  08/25/2011  T:  08/25/2011  Job:  [id number]  Electronically Signed by Kari Baars N.P. on 08/26/2011 09:50:06 AM Electronically Signed by Orson Aloe  on 08/26/2011 12:21:17 PM

## 2011-08-27 LAB — GLUCOSE, CAPILLARY: Glucose-Capillary: 125 mg/dL — ABNORMAL HIGH (ref 70–99)

## 2011-09-02 LAB — COMPREHENSIVE METABOLIC PANEL
ALT: 26
AST: 33
Albumin: 4.4
Alkaline Phosphatase: 94
BUN: 16
CO2: 23
Calcium: 8.6
Chloride: 99
Creatinine, Ser: 0.57
GFR calc Af Amer: >60
GFR calc non Af Amer: >60
Glucose, Bld: 148 — ABNORMAL HIGH
Potassium: 4.3
Sodium: 137
Total Bilirubin: 0.7
Total Protein: 7.9

## 2011-09-02 LAB — DIFFERENTIAL
Basophils Absolute: 0
Basophils Relative: 1
Eosinophils Absolute: 0
Eosinophils Relative: 0
Lymphocytes Relative: 52 — ABNORMAL HIGH
Lymphs Abs: 2.7
Monocytes Absolute: 0.2
Monocytes Relative: 5
Neutro Abs: 2.2
Neutrophils Relative %: 43

## 2011-09-02 LAB — TSH: TSH: 5.208

## 2011-09-02 LAB — RAPID URINE DRUG SCREEN, HOSP PERFORMED
Amphetamines: NOT DETECTED
Barbiturates: NOT DETECTED
Benzodiazepines: NOT DETECTED
Cocaine: NOT DETECTED
Opiates: NOT DETECTED
Tetrahydrocannabinol: NOT DETECTED

## 2011-09-02 LAB — CBC
HCT: 41.5
Hemoglobin: 14.4
MCHC: 34.6
MCV: 94.2
Platelets: 270
RBC: 4.4
RDW: 15.5
WBC: 5.2

## 2011-09-02 LAB — ETHANOL: Alcohol, Ethyl (B): 348 — ABNORMAL HIGH

## 2011-09-09 LAB — CBC
HCT: 36
Hemoglobin: 12.2
MCHC: 34
MCV: 95.9
Platelets: 214
RBC: 3.75 — ABNORMAL LOW
RDW: 14.2
WBC: 5.1

## 2011-09-09 LAB — RAPID URINE DRUG SCREEN, HOSP PERFORMED
Amphetamines: NOT DETECTED
Barbiturates: NOT DETECTED
Benzodiazepines: POSITIVE — AB
Cocaine: NOT DETECTED
Opiates: NOT DETECTED
Tetrahydrocannabinol: NOT DETECTED

## 2011-09-09 LAB — POCT I-STAT, CHEM 8
BUN: 16
Calcium, Ion: 1.1 — ABNORMAL LOW
Chloride: 105
Creatinine, Ser: 0.7
Glucose, Bld: 174 — ABNORMAL HIGH
HCT: 38
Hemoglobin: 12.9
Potassium: 4
Sodium: 136
TCO2: 21

## 2011-09-09 LAB — ETHANOL: Alcohol, Ethyl (B): 85 — ABNORMAL HIGH

## 2011-09-09 LAB — URINALYSIS, ROUTINE W REFLEX MICROSCOPIC
Bilirubin Urine: NEGATIVE
Glucose, UA: >1000 — AB
Hgb urine dipstick: NEGATIVE
Ketones, ur: NEGATIVE
Leukocytes, UA: NEGATIVE
Nitrite: NEGATIVE
Protein, ur: NEGATIVE
Specific Gravity, Urine: 1.02
Urobilinogen, UA: 0.2
pH: 5

## 2011-09-09 LAB — URINE MICROSCOPIC-ADD ON

## 2011-09-09 LAB — DIFFERENTIAL
Basophils Absolute: 0.1
Basophils Relative: 2 — ABNORMAL HIGH
Eosinophils Absolute: 0.1
Eosinophils Relative: 2
Lymphocytes Relative: 38
Lymphs Abs: 1.9
Monocytes Absolute: 0.5
Monocytes Relative: 10
Neutro Abs: 2.5
Neutrophils Relative %: 49

## 2011-09-14 LAB — DIFFERENTIAL
Basophils Absolute: 0
Basophils Absolute: 0
Basophils Relative: 1
Basophils Relative: 1
Eosinophils Absolute: 0
Eosinophils Absolute: 0.1
Eosinophils Relative: 0
Eosinophils Relative: 2
Lymphocytes Relative: 46
Lymphocytes Relative: 43
Lymphs Abs: 2.6
Lymphs Abs: 2.3
Monocytes Absolute: 0.3
Monocytes Absolute: 0.5
Monocytes Relative: 6
Monocytes Relative: 9
Neutro Abs: 2.7
Neutro Abs: 2.4
Neutrophils Relative %: 48
Neutrophils Relative %: 46

## 2011-09-14 LAB — RAPID URINE DRUG SCREEN, HOSP PERFORMED
Amphetamines: NOT DETECTED
Barbiturates: NOT DETECTED
Benzodiazepines: POSITIVE — AB
Cocaine: NOT DETECTED
Opiates: NOT DETECTED
Tetrahydrocannabinol: NOT DETECTED

## 2011-09-14 LAB — URINALYSIS, ROUTINE W REFLEX MICROSCOPIC
Bilirubin Urine: NEGATIVE
Bilirubin Urine: NEGATIVE
Glucose, UA: 100 — AB
Glucose, UA: NEGATIVE
Hgb urine dipstick: NEGATIVE
Hgb urine dipstick: NEGATIVE
Ketones, ur: NEGATIVE
Ketones, ur: NEGATIVE
Nitrite: NEGATIVE
Nitrite: POSITIVE — AB
Protein, ur: NEGATIVE
Protein, ur: NEGATIVE
Specific Gravity, Urine: 1.009
Specific Gravity, Urine: 1.005
Urobilinogen, UA: 0.2
Urobilinogen, UA: 0.2
pH: 5.5
pH: 6

## 2011-09-14 LAB — BASIC METABOLIC PANEL
BUN: 6
CO2: 25
Calcium: 9.3
Chloride: 105
Creatinine, Ser: 0.65
GFR calc Af Amer: >60
GFR calc non Af Amer: >60
Glucose, Bld: 96
Potassium: 4.1
Sodium: 144

## 2011-09-14 LAB — URINE MICROSCOPIC-ADD ON

## 2011-09-14 LAB — CBC
HCT: 44.1
HCT: 40.7
Hemoglobin: 14.6
Hemoglobin: 13.5
MCHC: 33
MCHC: 33.1
MCV: 95.4
MCV: 95.7
Platelets: 326
Platelets: 268
RBC: 4.63
RBC: 4.25
RDW: 15.4
RDW: 15.4
WBC: 5.7
WBC: 5.3

## 2011-09-14 LAB — ETHANOL
Alcohol, Ethyl (B): 218 — ABNORMAL HIGH
Alcohol, Ethyl (B): 208 — ABNORMAL HIGH

## 2011-09-14 LAB — COMPREHENSIVE METABOLIC PANEL
ALT: 24
AST: 21
Albumin: 3.9
Alkaline Phosphatase: 83
BUN: 7
CO2: 20
Calcium: 9.2
Chloride: 97
Creatinine, Ser: 0.57
GFR calc Af Amer: >60
GFR calc non Af Amer: >60
Glucose, Bld: 193 — ABNORMAL HIGH
Potassium: 4.3
Sodium: 134 — ABNORMAL LOW
Total Bilirubin: 0.4
Total Protein: 7.3

## 2011-09-14 LAB — LIPASE, BLOOD
Lipase: 24
Lipase: 22

## 2011-09-16 NOTE — Discharge Summary (Signed)
  NAMEKATHERLEEN, [name] NO.:  [id number]  MEDICAL RECORD NO.:  [id number]  LOCATION:  0305                          FACILITY:  [facility]  PHYSICIAN:  Orson Aloe, MD       DATE OF BIRTH:  13-Mar-1946  DATE OF ADMISSION:  08/25/2011 DATE OF DISCHARGE:  08/27/2011                              DISCHARGE SUMMARY   ADDENDUM:  CONDITION ON DISCHARGE:  The patient is a 65 year old [name] from [place] who denies suicidal or homicidal ideation.  Denies hallucinations, delusions, delusions.  She had good eye contact, able to focus well enough in group and individual settings, had clear goal- directed thoughts and is oriented times 4.  Recent and remote memory was intact.  Judgment is fairly careful to avoid letting her probation officer no about her recent relapse and alcoholism and her insight seems to be extremely limited.  In the area of her substance abuse she has very little concept about her disease alcohol addiction.  DIAGNOSIS:  AXIS I:  Alcohol dependence. AXIS II: Deferred. AXIS III:  Diverticulosis, diabetes mellitus type 2 and fibromyalgia. AXIS IV:  Moderate to severe with medical illness, multiple legal issues and psychosocial issues related to substance use. AXIS V:  55.  DISCHARGE PLAN:  Recommendations was to resume typical activity and diet, resume carbohydrate-modified diet for her diabetes.  Medications was continue Lunesta 3 mg at bedtime for insomnia, Bystolic 5 mg for blood pressure daily, Co-Q10 for heart health, hydrocodone 5/325 for fibromyalgia pain, Januvia 100 mg for diabetes, Lexapro 10 mg for depression, lisinopril 5 mg for hypertension and Lomotil one for diarrhea and metformin XR 750 mg two at bedtime for diabetes.  Follow-up with Dr. Evelene Croon at [facility], [address], phone number is [phone] for an appointment on 08/27/11. Also follow up with Dr. __________ before restarting the Adderall.     ______________________________ Orson Aloe, MD     EW/MEDQ  D:  09/10/2011  T:  09/10/2011  Job:  [id number]  Electronically Signed by Orson Aloe  on 09/16/2011 08:10:59 AM
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
## 2011-09-27 LAB — BASIC METABOLIC PANEL
BUN: 11
CO2: 24
Calcium: 8.7
Chloride: 105
Creatinine, Ser: 0.67
GFR calc Af Amer: >60
GFR calc non Af Amer: >60
Glucose, Bld: 150 — ABNORMAL HIGH
Potassium: 4.6
Sodium: 139

## 2011-09-29 LAB — URINE DRUGS OF ABUSE SCREEN W ALC, ROUTINE (REF LAB)
Amphetamine Screen, Ur: NEGATIVE
Barbiturate Quant, Ur: NEGATIVE
Benzodiazepines.: NEGATIVE
Cocaine Metabolites: NEGATIVE
Creatinine,U: 166.6
Ethyl Alcohol: <5
Marijuana Metabolite: NEGATIVE
Methadone: NEGATIVE
Opiate Screen, Urine: NEGATIVE
Phencyclidine (PCP): NEGATIVE
Propoxyphene: NEGATIVE

## 2011-09-30 LAB — URINE DRUGS OF ABUSE SCREEN W ALC, ROUTINE (REF LAB)
Amphetamine Screen, Ur: NEGATIVE
Barbiturate Quant, Ur: NEGATIVE
Benzodiazepines.: NEGATIVE
Cocaine Metabolites: NEGATIVE
Creatinine,U: 52.1
Ethyl Alcohol: <10
Marijuana Metabolite: NEGATIVE
Methadone: NEGATIVE
Opiate Screen, Urine: NEGATIVE
Phencyclidine (PCP): NEGATIVE
Propoxyphene: NEGATIVE

## 2011-11-10 ENCOUNTER — Emergency Department (HOSPITAL_COMMUNITY): Payer: 59

## 2011-11-10 ENCOUNTER — Encounter (HOSPITAL_COMMUNITY): Payer: Self-pay | Admitting: *Deleted

## 2011-11-10 ENCOUNTER — Observation Stay (HOSPITAL_COMMUNITY)
Admission: EM | Admit: 2011-11-10 | Discharge: 2011-11-11 | Disposition: A | Discharge status: Home / Self Care | Payer: 59 | Source: Ambulatory Visit | Attending: Emergency Medicine | Admitting: Emergency Medicine

## 2011-11-10 DIAGNOSIS — G459 Transient cerebral ischemic attack, unspecified: Secondary | ICD-10-CM

## 2011-11-10 DIAGNOSIS — E119 Type 2 diabetes mellitus without complications: Secondary | ICD-10-CM | POA: Insufficient documentation

## 2011-11-10 DIAGNOSIS — I10 Essential (primary) hypertension: Secondary | ICD-10-CM | POA: Insufficient documentation

## 2011-11-10 DIAGNOSIS — R4182 Altered mental status, unspecified: Principal | ICD-10-CM | POA: Insufficient documentation

## 2011-11-10 DIAGNOSIS — R209 Unspecified disturbances of skin sensation: Secondary | ICD-10-CM | POA: Insufficient documentation

## 2011-11-10 DIAGNOSIS — R262 Difficulty in walking, not elsewhere classified: Secondary | ICD-10-CM | POA: Insufficient documentation

## 2011-11-10 DIAGNOSIS — F1010 Alcohol abuse, uncomplicated: Secondary | ICD-10-CM | POA: Insufficient documentation

## 2011-11-10 LAB — COMPREHENSIVE METABOLIC PANEL
ALT: 13 U/L (ref 0–35)
AST: 16 U/L (ref 0–37)
Albumin: 4 g/dL (ref 3.5–5.2)
Alkaline Phosphatase: 62 U/L (ref 39–117)
BUN: 11 mg/dL (ref 6–23)
CO2: 25 mEq/L (ref 19–32)
Calcium: 8.7 mg/dL (ref 8.4–10.5)
Chloride: 98 mEq/L (ref 96–112)
Creatinine, Ser: 0.62 mg/dL (ref 0.50–1.10)
GFR calc Af Amer: >90 mL/min (ref >90)
GFR calc non Af Amer: >90 mL/min (ref >90)
Glucose, Bld: 174 mg/dL — ABNORMAL HIGH (ref 70–99)
Potassium: 4.2 mEq/L (ref 3.5–5.1)
Sodium: 137 mEq/L (ref 135–145)
Total Bilirubin: 0.4 mg/dL (ref 0.3–1.2)
Total Protein: 7.6 g/dL (ref 6.0–8.3)

## 2011-11-10 LAB — BASIC METABOLIC PANEL
BUN: 12 mg/dL (ref 6–23)
CO2: 21 mEq/L (ref 19–32)
Calcium: 8.9 mg/dL (ref 8.4–10.5)
Chloride: 96 mEq/L (ref 96–112)
Creatinine, Ser: 0.72 mg/dL (ref 0.50–1.10)
GFR calc Af Amer: >90 mL/min (ref >90)
GFR calc non Af Amer: 89 mL/min — ABNORMAL LOW (ref >90)
Glucose, Bld: 170 mg/dL — ABNORMAL HIGH (ref 70–99)
Potassium: 4 mEq/L (ref 3.5–5.1)
Sodium: 137 mEq/L (ref 135–145)

## 2011-11-10 LAB — RAPID URINE DRUG SCREEN, HOSP PERFORMED
Amphetamines: NOT DETECTED
Barbiturates: NOT DETECTED
Benzodiazepines: NOT DETECTED
Cocaine: NOT DETECTED
Opiates: NOT DETECTED
Tetrahydrocannabinol: NOT DETECTED

## 2011-11-10 LAB — CBC
HCT: 38.1 % (ref 36.0–46.0)
Hemoglobin: 13.1 g/dL (ref 12.0–15.0)
MCH: 32.8 pg (ref 26.0–34.0)
MCHC: 34.4 g/dL (ref 30.0–36.0)
MCV: 95.5 fL (ref 78.0–100.0)
Platelets: 309 10*3/uL (ref 150–400)
RBC: 3.99 MIL/uL (ref 3.87–5.11)
RDW: 14 % (ref 11.5–15.5)
WBC: 7.2 10*3/uL (ref 4.0–10.5)

## 2011-11-10 LAB — DIFFERENTIAL
Basophils Absolute: 0 10*3/uL (ref 0.0–0.1)
Basophils Relative: 0 % (ref 0–1)
Eosinophils Absolute: 0 10*3/uL (ref 0.0–0.7)
Eosinophils Relative: 0 % (ref 0–5)
Lymphocytes Relative: 47 % — ABNORMAL HIGH (ref 12–46)
Lymphs Abs: 3.3 10*3/uL (ref 0.7–4.0)
Monocytes Absolute: 0.5 10*3/uL (ref 0.1–1.0)
Monocytes Relative: 7 % (ref 3–12)
Neutro Abs: 3.3 10*3/uL (ref 1.7–7.7)
Neutrophils Relative %: 46 % (ref 43–77)

## 2011-11-10 LAB — ETHANOL: Alcohol, Ethyl (B): 76 mg/dL — ABNORMAL HIGH (ref 0–11)

## 2011-11-10 LAB — CARDIAC PANEL(CRET KIN+CKTOT+MB+TROPI)
CK, MB: 2.3 ng/mL (ref 0.3–4.0)
Relative Index: 2 (ref 0.0–2.5)
Total CK: 116 U/L (ref 7–177)
Troponin I: <0.3 ng/mL (ref <0.30)

## 2011-11-10 LAB — URINALYSIS, ROUTINE W REFLEX MICROSCOPIC
Bilirubin Urine: NEGATIVE
Glucose, UA: NEGATIVE mg/dL
Hgb urine dipstick: NEGATIVE
Ketones, ur: NEGATIVE mg/dL
Nitrite: POSITIVE — AB
Protein, ur: NEGATIVE mg/dL
Specific Gravity, Urine: 1.031 — ABNORMAL HIGH (ref 1.005–1.030)
Urobilinogen, UA: 0.2 mg/dL (ref 0.0–1.0)
pH: 5 (ref 5.0–8.0)

## 2011-11-10 LAB — URINE MICROSCOPIC-ADD ON

## 2011-11-10 LAB — PROTIME-INR
INR: 1.05 (ref 0.00–1.49)
Prothrombin Time: 13.9 seconds (ref 11.6–15.2)

## 2011-11-10 LAB — APTT: aPTT: 27 seconds (ref 24–37)

## 2011-11-10 LAB — GLUCOSE, CAPILLARY: Glucose-Capillary: 151 mg/dL — ABNORMAL HIGH (ref 70–99)

## 2011-11-10 MED ORDER — SODIUM CHLORIDE 0.9 % IV SOLN
INTRAVENOUS | Status: DC
  Administered 2011-11-10: 15:00:00 via INTRAVENOUS

## 2011-11-10 MED ORDER — LORAZEPAM 2 MG/ML IJ SOLN
1.0000 mg | Freq: Once | INTRAMUSCULAR | Status: AC
  Administered 2011-11-10: 1 mg via INTRAVENOUS
  Filled 2011-11-10: qty 1

## 2011-11-10 MED ORDER — ASPIRIN 325 MG PO TABS
325.0000 mg | ORAL_TABLET | Freq: Every day | ORAL | Status: DC
  Administered 2011-11-10 – 2011-11-11 (×2): 325 mg via ORAL
  Filled 2011-11-10 (×2): qty 1

## 2011-11-10 NOTE — ED Notes (Signed)
Patient transported to MRI 

## 2011-11-10 NOTE — ED Notes (Signed)
Patient transported to CT 

## 2011-11-10 NOTE — ED Notes (Signed)
Returned from CT scan. Reports numbness to left side mouth continues to improve. Also states medication helped with abd pain/cramping

## 2011-11-10 NOTE — ED Notes (Signed)
Pt reports sudden onset of left side weakness and difficulty walking that started this am, does not know specific time but estimates around 9-10am. Last seen normal by family several days ago. Family reports pt drinking heavily for last few days, last drank 3 glasses of wine this am. At this time, grips are equal, no arm drift noted. Speech sounds slurred and mild left side facial droop. Airway intact, resp e/u.

## 2011-11-10 NOTE — ED Provider Notes (Addendum)
History     CSN: [id number] Arrival date & time: 11/10/2011  2:02 PM   First MD Initiated Contact with Patient 11/10/11 1434      Chief Complaint  Patient presents with  . Numbness    (Consider location/radiation/quality/duration/timing/severity/associated sxs/prior treatment) Patient is a 65 y.o. female presenting with weakness. The history is provided by the patient and a relative.  Weakness The primary symptoms include altered mental status. Primary symptoms do not include headaches, syncope, loss of consciousness, fever or vomiting. The symptoms are improving. Context: Patient noticed problem after sleeping late this morning.  The change in mental status has been rapidly worsening since its onset.  Additional symptoms include weakness. Medical issues do not include seizures or cerebral vascular accident. Associated medical issues comments: The patient has been drinking alcohol heavily recently..   the patient fell, weakness, and numbness in the left side of her body. She called her husband to bring her some alcohol because she gushes some more. He was concerned that she had a droop on the left side of her face. He was able to bring her here by private vehicle by helping her to walk. She has improved significantly by the time. She was seen in emergency room. She has not had a difficulty with cough, shortness of breath, nausea, vomiting, change in bowel or urinary habits. Her symptoms are described as significant and worrisome for her. Past Medical History  Diagnosis Date  . Alcohol abuse   . Diabetes mellitus   . Hypertension     Past Surgical History  Procedure Date  . Cesarean section   . Tonsillectomy   . Bunionectomy     History reviewed. No pertinent family history.  History  Substance Use Topics  . Smoking status: Current Some Day Smoker  . Smokeless tobacco: Not on file  . Alcohol Use: Yes     4-5 bottles of wine per day    OB History    Grav Para Term Preterm  Abortions TAB SAB Ect Mult Living                  Review of Systems  Constitutional: Negative for fever.  Cardiovascular: Negative for syncope.  Gastrointestinal: Negative for vomiting.  Neurological: Positive for weakness. Negative for loss of consciousness and headaches.  Psychiatric/Behavioral: Positive for altered mental status.  All other systems reviewed and are negative.    Allergies  Codeine; Latex; Pentazocine lactate; and Sulfonamide derivatives  Home Medications   Current Outpatient Rx  Name Route Sig Dispense Refill  . EZETIMIBE 10 MG PO TABS Oral Take 10 mg by mouth daily.      Marland Kitchen HYDROCODONE-ACETAMINOPHEN 5-325 MG PO TABS Oral Take 1 tablet by mouth every 6 (six) hours as needed.      Marland Kitchen LISINOPRIL 5 MG PO TABS Oral Take 5 mg by mouth daily.      Marland Kitchen METFORMIN HCL ER 750 MG PO TB24 Oral Take 1,500 mg by mouth at bedtime.      . NEBIVOLOL HCL 5 MG PO TABS Oral Take 5 mg by mouth daily.      Marland Kitchen FISH OIL 1000 MG PO CPDR Oral Take 4 capsules by mouth at bedtime. For a total of 4000mg  daily     . SITAGLIPTIN PHOSPHATE 100 MG PO TABS Oral Take 100 mg by mouth daily.        BP 162/93  Pulse 106  Temp(Src) 99.6 F (37.6 C) (Oral)  Resp 16  SpO2 95%  Physical Exam  Constitutional: She is oriented to person, place, and time. She appears well-developed and well-nourished.  HENT:  Head: Normocephalic and atraumatic.  Eyes: Conjunctivae and EOM are normal. Pupils are equal, round, and reactive to light.  Neck: Normal range of motion. Neck supple.  Cardiovascular: Normal rate and regular rhythm.   Pulmonary/Chest: Effort normal and breath sounds normal.  Abdominal: Soft. Bowel sounds are normal.  Musculoskeletal: Normal range of motion.  Neurological: She is alert and oriented to person, place, and time. No cranial nerve deficit. She exhibits normal muscle tone. Coordination normal.       No focal paresthesia to light touch  Skin: Skin is warm and dry.  Psychiatric: Her  behavior is normal. Judgment and thought content normal.       The patient is moderately anxious    ED Course  Procedures (including critical care time)  Labs Reviewed  DIFFERENTIAL - Abnormal; Notable for the following:    Lymphocytes Relative 47 (*)    All other components within normal limits  BASIC METABOLIC PANEL - Abnormal; Notable for the following:    Glucose, Bld 170 (*)    GFR calc non Af Amer 89 (*)    All other components within normal limits  ETHANOL - Abnormal; Notable for the following:    Alcohol, Ethyl (B) 76 (*)    All other components within normal limits  CBC  URINALYSIS, ROUTINE W REFLEX MICROSCOPIC  URINE CULTURE  URINE RAPID DRUG SCREEN (HOSP PERFORMED)   No results found.   No diagnosis found.   Date: 11/10/2011  Rate: 100  Rhythm: sinus tachycardia  QRS Axis: left  Intervals: normal  ST/T Wave abnormalities: normal  Conduction Disutrbances:none  Narrative Interpretation:   Old EKG Reviewed: unchanged   MDM  Transient symptoms, essentially resolved in the ED. TIA is likely.        Flint Melter, MD 11/10/11 1700  Flint Melter, MD 11/10/11 [phone]

## 2011-11-10 NOTE — ED Notes (Signed)
Pt given something to eat

## 2011-11-10 NOTE — ED Notes (Signed)
Reports 1100 noted legs weren't working right, "there was no connection between my head & legs". Also c/o numbness but unable to tell if it was one or both. At 1245 noticed left side face numbness, facial droop & unable to pronounce words. Presently all symptoms resolved except left side mouth continues to be a little numb. Speech clear. Denies h/a, dizziness.

## 2011-11-10 NOTE — ED Notes (Signed)
Returned from mri 

## 2011-11-10 NOTE — ED Provider Notes (Signed)
Pt is a patient of Dr. Effie Shy to be placed on TIA protocol. Will have MRI done in the morning. Pt was anxious previously and given Ativan 1mg . Pt is a drinker and showing some signs of alcohol withdrawal. Therefore, pt is being checked on regularly to screen for withdrawals. At my time of sign out, pt resting comfortable in her bed.  Dorthula Matas, PA 11/10/11 [phone]

## 2011-11-11 ENCOUNTER — Other Ambulatory Visit (HOSPITAL_COMMUNITY): Payer: 59

## 2011-11-11 ENCOUNTER — Emergency Department (HOSPITAL_COMMUNITY): Payer: 59

## 2011-11-11 DIAGNOSIS — G459 Transient cerebral ischemic attack, unspecified: Secondary | ICD-10-CM

## 2011-11-11 LAB — GLUCOSE, CAPILLARY: Glucose-Capillary: 154 mg/dL — ABNORMAL HIGH (ref 70–99)

## 2011-11-11 LAB — LIPID PANEL
Cholesterol: 270 mg/dL — ABNORMAL HIGH (ref 0–200)
HDL: 69 mg/dL (ref >39)
LDL Cholesterol: 135 mg/dL — ABNORMAL HIGH (ref 0–99)
Total CHOL/HDL Ratio: 3.9 RATIO
Triglycerides: 329 mg/dL — ABNORMAL HIGH (ref <150)
VLDL: 66 mg/dL — ABNORMAL HIGH (ref 0–40)

## 2011-11-11 LAB — URINE CULTURE
Colony Count: >=100000
Culture  Setup Time: 201211281643

## 2011-11-11 LAB — CARDIAC PANEL(CRET KIN+CKTOT+MB+TROPI)
CK, MB: 2.2 ng/mL (ref 0.3–4.0)
Relative Index: INVALID (ref 0.0–2.5)
Total CK: 84 U/L (ref 7–177)
Troponin I: <0.3 ng/mL (ref <0.30)

## 2011-11-11 LAB — HEMOGLOBIN A1C
Hgb A1c MFr Bld: 7 % — ABNORMAL HIGH (ref <5.7)
Mean Plasma Glucose: 154 mg/dL — ABNORMAL HIGH (ref <117)

## 2011-11-11 MED ORDER — LISINOPRIL 5 MG PO TABS
5.0000 mg | ORAL_TABLET | Freq: Once | ORAL | Status: AC
  Administered 2011-11-11: 5 mg via ORAL
  Filled 2011-11-11: qty 1

## 2011-11-11 MED ORDER — EZETIMIBE 10 MG PO TABS
10.0000 mg | ORAL_TABLET | Freq: Every day | ORAL | Status: DC
  Administered 2011-11-11: 10 mg via ORAL
  Filled 2011-11-11: qty 1

## 2011-11-11 MED ORDER — NEBIVOLOL HCL 5 MG PO TABS
5.0000 mg | ORAL_TABLET | Freq: Every day | ORAL | Status: DC
  Administered 2011-11-11: 5 mg via ORAL
  Filled 2011-11-11: qty 1

## 2011-11-11 MED ORDER — LINAGLIPTIN 5 MG PO TABS
5.0000 mg | ORAL_TABLET | Freq: Every day | ORAL | Status: DC
  Administered 2011-11-11: 5 mg via ORAL
  Filled 2011-11-11 (×2): qty 1

## 2011-11-11 MED ORDER — ATORVASTATIN CALCIUM 10 MG PO TABS: 10.0000 mg | ORAL_TABLET | Freq: Every day | ORAL | Status: DC

## 2011-11-11 MED ORDER — LORAZEPAM 2 MG/ML IJ SOLN
1.0000 mg | Freq: Once | INTRAMUSCULAR | Status: AC
  Administered 2011-11-11: 1 mg via INTRAVENOUS
  Filled 2011-11-11: qty 1

## 2011-11-11 MED ORDER — ASPIRIN 81 MG PO CHEW: 81.0000 mg | CHEWABLE_TABLET | Freq: Every day | ORAL | Status: DC

## 2011-11-11 MED ORDER — DIPHENOXYLATE-ATROPINE 2.5-0.025 MG PO TABS
1.0000 | ORAL_TABLET | Freq: Once | ORAL | Status: AC
  Administered 2011-11-11: 1 via ORAL
  Filled 2011-11-11: qty 1

## 2011-11-11 MED ORDER — CEPHALEXIN 500 MG PO CAPS: 500.0000 mg | ORAL_CAPSULE | Freq: Four times a day (QID) | ORAL | Status: AC

## 2011-11-11 MED ORDER — METFORMIN HCL 500 MG PO TABS
750.0000 mg | ORAL_TABLET | Freq: Once | ORAL | Status: AC
  Administered 2011-11-11: 750 mg via ORAL
  Filled 2011-11-11: qty 2

## 2011-11-11 NOTE — ED Provider Notes (Signed)
Medical screening examination/treatment/procedure(s) were performed by non-physician practitioner and as supervising physician I was immediately available for consultation/collaboration.   Damyiah Moxley L Leshawn Straka, MD 11/11/11 1458 

## 2011-11-11 NOTE — ED Provider Notes (Signed)
This is a 65 year old female presenting to the ED with a chief complaint of sudden onset of left-sided weakness and difficulty walking. She was placed in the CDU under a TIA protocol. She has a significant history of alcohol abuse, and continue her request Ativan for anxiety. This is likely related to alcohol withdrawal.  She presents with an ethanol level of 76.  Her urine is significant for a positive nitrite, consistence with many prior urinalysis result. Therefore, urine culture sent. She denies any dysuria. The cardiac panel is unremarkable. She has hemoglobin A1c at 7.0. Her comprehensive metabolic panel, coagulation panel, and CBC is unremarkable. Head CT without contrast revealed no acute finding.  9:23 AM Patient has an elevated total cholesterol of 270, triglyceride level of 329, and LDL 135. Patient recently went for her MRI of brain and MRA of head. Unfortunately, patient was moving about while imaging, causing poor reading. Patient states, I was falling asleep and I keep waking up and moving about. Due to the importance of ruling out for possible stroke, I will reimage. Ativan given. Patient is aware of the importance of maintaining a still posture while imaging. Patient is currently in no acute distress, and denies any focal neuro deficit.  1:01 PM Patient is currently back to her normal baseline. She denies headaches, or weakness. I have talked to the radiologist who has read the MRI/MRA results. His recommendation is to have the patient reimage with an MRA sometime in the next week, when the patient is of her normal states. At this time, he does not notice any significant finding. However, it is difficult to interpret due to motion artifacts. Doubt subdural or intracranial hemorrhage at this time. Patient has agreed to follow up with her primary care doctor to set up reimaging. Patient also instructed to come to the MRI suite here for imaging. Patient is also instructed to come back to the ED  if her symptoms persist or worsen.  I will also discharge patient with aspirin, and a statin medication.  Fayrene Helper, PA 11/11/11 1306

## 2011-11-11 NOTE — ED Provider Notes (Signed)
Medical screening examination/treatment/procedure(s) were conducted as a shared visit with non-physician practitioner(s) and myself.  I personally evaluated the patient during the encounter  Flint Melter, MD 11/11/11 2105

## 2011-11-11 NOTE — Progress Notes (Signed)
  Echocardiogram 2D Echocardiogram has been performed.  [name] 11/11/2011, 8:56 AM

## 2011-11-11 NOTE — ED Notes (Signed)
Mri has called and they advise that pt is again falling asleep and moving about. They are unable to complete scan at this time

## 2011-11-11 NOTE — Progress Notes (Signed)
*  PRELIMINARY RESULTS* Carotid dopplers performed. Results showed no ICA stenosis. Antegrade vertebral artery flow bilateral.  [name] 11/11/2011, 8:29 AM

## 2011-11-15 ENCOUNTER — Encounter (HOSPITAL_COMMUNITY): Payer: Self-pay | Admitting: Emergency Medicine

## 2011-11-15 ENCOUNTER — Emergency Department (HOSPITAL_COMMUNITY)
Admission: EM | Admit: 2011-11-15 | Discharge: 2011-11-17 | Disposition: A | Discharge status: Home / Self Care | Payer: 59 | Attending: Emergency Medicine | Admitting: Emergency Medicine

## 2011-11-15 DIAGNOSIS — E119 Type 2 diabetes mellitus without complications: Secondary | ICD-10-CM | POA: Insufficient documentation

## 2011-11-15 DIAGNOSIS — I10 Essential (primary) hypertension: Secondary | ICD-10-CM | POA: Insufficient documentation

## 2011-11-15 DIAGNOSIS — F10929 Alcohol use, unspecified with intoxication, unspecified: Secondary | ICD-10-CM

## 2011-11-15 DIAGNOSIS — F1010 Alcohol abuse, uncomplicated: Secondary | ICD-10-CM | POA: Insufficient documentation

## 2011-11-15 DIAGNOSIS — Z79899 Other long term (current) drug therapy: Secondary | ICD-10-CM | POA: Insufficient documentation

## 2011-11-15 DIAGNOSIS — Z7982 Long term (current) use of aspirin: Secondary | ICD-10-CM | POA: Insufficient documentation

## 2011-11-15 LAB — URINALYSIS, ROUTINE W REFLEX MICROSCOPIC
Bilirubin Urine: NEGATIVE
Glucose, UA: NEGATIVE mg/dL
Ketones, ur: NEGATIVE mg/dL
Nitrite: NEGATIVE
Protein, ur: 30 mg/dL — AB
Specific Gravity, Urine: 1.013 (ref 1.005–1.030)
Urobilinogen, UA: 0.2 mg/dL (ref 0.0–1.0)
pH: 5 (ref 5.0–8.0)

## 2011-11-15 LAB — RAPID URINE DRUG SCREEN, HOSP PERFORMED
Amphetamines: NOT DETECTED
Barbiturates: NOT DETECTED
Benzodiazepines: NOT DETECTED
Cocaine: NOT DETECTED
Opiates: NOT DETECTED
Tetrahydrocannabinol: NOT DETECTED

## 2011-11-15 LAB — COMPREHENSIVE METABOLIC PANEL
ALT: 15 U/L (ref 0–35)
AST: 17 U/L (ref 0–37)
Albumin: 4.3 g/dL (ref 3.5–5.2)
Alkaline Phosphatase: 86 U/L (ref 39–117)
BUN: 23 mg/dL (ref 6–23)
CO2: 19 mEq/L (ref 19–32)
Calcium: 8.4 mg/dL (ref 8.4–10.5)
Chloride: 92 mEq/L — ABNORMAL LOW (ref 96–112)
Creatinine, Ser: 0.71 mg/dL (ref 0.50–1.10)
GFR calc Af Amer: >90 mL/min (ref >90)
GFR calc non Af Amer: 90 mL/min — ABNORMAL LOW (ref >90)
Glucose, Bld: 208 mg/dL — ABNORMAL HIGH (ref 70–99)
Potassium: 4.6 mEq/L (ref 3.5–5.1)
Sodium: 134 mEq/L — ABNORMAL LOW (ref 135–145)
Total Bilirubin: 0.4 mg/dL (ref 0.3–1.2)
Total Protein: 8.1 g/dL (ref 6.0–8.3)

## 2011-11-15 LAB — ETHANOL: Alcohol, Ethyl (B): 319 mg/dL — ABNORMAL HIGH (ref 0–11)

## 2011-11-15 LAB — CBC
HCT: 41.2 % (ref 36.0–46.0)
Hemoglobin: 14.8 g/dL (ref 12.0–15.0)
MCH: 33.2 pg (ref 26.0–34.0)
MCHC: 35.9 g/dL (ref 30.0–36.0)
MCV: 92.4 fL (ref 78.0–100.0)
Platelets: 317 10*3/uL (ref 150–400)
RBC: 4.46 MIL/uL (ref 3.87–5.11)
RDW: 13.8 % (ref 11.5–15.5)
WBC: 6.9 10*3/uL (ref 4.0–10.5)

## 2011-11-15 LAB — DIFFERENTIAL
Basophils Absolute: 0 10*3/uL (ref 0.0–0.1)
Basophils Relative: 1 % (ref 0–1)
Eosinophils Absolute: 0 10*3/uL (ref 0.0–0.7)
Eosinophils Relative: 0 % (ref 0–5)
Lymphocytes Relative: 48 % — ABNORMAL HIGH (ref 12–46)
Lymphs Abs: 3.3 10*3/uL (ref 0.7–4.0)
Monocytes Absolute: 0.4 10*3/uL (ref 0.1–1.0)
Monocytes Relative: 6 % (ref 3–12)
Neutro Abs: 3.2 10*3/uL (ref 1.7–7.7)
Neutrophils Relative %: 46 % (ref 43–77)

## 2011-11-15 LAB — URINE MICROSCOPIC-ADD ON

## 2011-11-15 MED ORDER — SODIUM CHLORIDE 0.9 % IV SOLN
INTRAVENOUS | Status: DC
  Administered 2011-11-16: 04:00:00 via INTRAVENOUS

## 2011-11-15 MED ORDER — LORAZEPAM 1 MG PO TABS
1.0000 mg | ORAL_TABLET | Freq: Once | ORAL | Status: AC
  Administered 2011-11-15: 1 mg via ORAL
  Filled 2011-11-15: qty 1

## 2011-11-15 MED ORDER — NITROFURANTOIN MONOHYD MACRO 100 MG PO CAPS
100.0000 mg | ORAL_CAPSULE | ORAL | Status: AC
  Administered 2011-11-16: 100 mg via ORAL
  Filled 2011-11-15: qty 1

## 2011-11-15 MED ORDER — SODIUM CHLORIDE 0.9 % IV BOLUS (SEPSIS)
500.0000 mL | Freq: Once | INTRAVENOUS | Status: AC
Start: 2011-11-15 — End: 2011-11-16
  Administered 2011-11-16: 01:00:00 via INTRAVENOUS

## 2011-11-15 MED ORDER — ONDANSETRON 4 MG PO TBDP
8.0000 mg | ORAL_TABLET | Freq: Once | ORAL | Status: AC
  Administered 2011-11-15: 8 mg via ORAL
  Filled 2011-11-15: qty 2

## 2011-11-15 NOTE — ED Notes (Signed)
Pt sts that she is detox from ETOH (last drink - wine was today just PTA) and hydrocodone (last dose was several days ago). Pt is noted to be anxious in triage, but cooperative. Pt denies any S.I. Or H.I. Pt has been seen in past for same C.C. And has been through detox.

## 2011-11-15 NOTE — ED Notes (Signed)
Pt st's she wants detox from ETOH  St's has had several small bottles of wine today.  C/O feeling nauseated.  Pt st's relapsed, last relapsed 6 months ago and then 9 months before then.  Pt st's her son died from suicide approx 10 yrs ago and has had problems since then.  Pt denies SI or HI

## 2011-11-15 NOTE — ED Provider Notes (Signed)
History     CSN: [id number] Arrival date & time: 11/15/2011  8:19 PM   First MD Initiated Contact with Patient 11/15/11 2106      Chief Complaint  Patient presents with  . Psychiatric Evaluation    HPI: Patient is a 65 y.o. female presenting with alcohol problem.  Alcohol Problem This is a chronic problem. The problem has been gradually worsening. She has tried nothing for the symptoms.  Reports she is here for detox from alcohol. States she has relapsed recently and wants to enter a rehab program to stop drinking. States she has been drinking a lot lately and wants to stop. States she has had several small bottles of wine today, and some just PTA. Also reports she has been abusing Vicodin as well but has not had any in approx 5 days. Denies SI/HI. Pt states she is not feeling well.   Past Medical History  Diagnosis Date  . Alcohol abuse   . Diabetes mellitus   . Hypertension     Past Surgical History  Procedure Date  . Cesarean section   . Tonsillectomy   . Bunionectomy     History reviewed. No pertinent family history.  History  Substance Use Topics  . Smoking status: Current Some Day Smoker  . Smokeless tobacco: Not on file  . Alcohol Use: Yes     4-5 bottles of wine per day    OB History    Grav Para Term Preterm Abortions TAB SAB Ect Mult Living                  Review of Systems  Constitutional: Negative.   HENT: Negative.   Eyes: Negative.   Respiratory: Negative.   Cardiovascular: Negative.   Gastrointestinal: Negative.   Genitourinary: Negative.   Musculoskeletal: Negative.   Skin: Negative.   Neurological: Negative.   Hematological: Negative.   Psychiatric/Behavioral: Negative.     Allergies  Codeine; Latex; Pentazocine lactate; and Sulfonamide derivatives  Home Medications   Current Outpatient Rx  Name Route Sig Dispense Refill  . ASPIRIN 81 MG PO CHEW Oral Chew 1 tablet (81 mg total) by mouth daily. 30 tablet 0  . CEPHALEXIN 500 MG PO  CAPS Oral Take 1 capsule (500 mg total) by mouth 4 (four) times daily. 20 capsule 0  . EZETIMIBE 10 MG PO TABS Oral Take 10 mg by mouth daily.      Marland Kitchen HYDROCODONE-ACETAMINOPHEN 5-325 MG PO TABS Oral Take 1 tablet by mouth every 6 (six) hours as needed. For pain    . LISINOPRIL 5 MG PO TABS Oral Take 5 mg by mouth daily.      Marland Kitchen METFORMIN HCL ER 750 MG PO TB24 Oral Take 1,500 mg by mouth at bedtime.      . NEBIVOLOL HCL 5 MG PO TABS Oral Take 5 mg by mouth daily.      Marland Kitchen FISH OIL 1000 MG PO CPDR Oral Take 4 capsules by mouth at bedtime. For a total of 4000mg  daily     . SITAGLIPTIN PHOSPHATE 100 MG PO TABS Oral Take 100 mg by mouth daily.        BP 136/86  Pulse 62  Temp(Src) 99.4 F (37.4 C) (Oral)  Resp 19  SpO2 100%  Physical Exam  Constitutional: She is oriented to person, place, and time. She appears well-developed and well-nourished.  HENT:  Head: Normocephalic and atraumatic.  Eyes: Conjunctivae are normal.  Neck: Neck supple.  Cardiovascular: Normal rate and  regular rhythm.   Pulmonary/Chest: Effort normal and breath sounds normal.  Abdominal: Soft.  Musculoskeletal: Normal range of motion.  Neurological: She is alert and oriented to person, place, and time. She has normal reflexes.  Skin: Skin is warm and dry.  Psychiatric: She has a normal mood and affect.       Pt w/ very labile emotions.    ED Course  Procedures Pt requesting detox from ETOH. ETOH level 319, U/A positive for UTI. Will initiate abx for UTI and consult w/ Act Team regarding pt evaluation.  0400:  Spoke w/ Berna Spare w/ Act Team who has agreed to see that pt gets evaluated for inpatient detox bed if meets criteria. Pt currently resting quietly w/o c/o's.  0600:  Pt placed on Ativan (ETOH withdrawal protocol), Ed Psych holding orders and Glycemic Control Protocol. I have discussed pt w/ Dr Judd Lien who is aware of plan.  Labs Reviewed - No data to display No results found.   No diagnosis found.    MDM    Alcohol intoxication/requesting detox        Leanne Chang, NP 11/16/11 [phone]

## 2011-11-16 LAB — GLUCOSE, CAPILLARY
Glucose-Capillary: 143 mg/dL — ABNORMAL HIGH (ref 70–99)
Glucose-Capillary: 144 mg/dL — ABNORMAL HIGH (ref 70–99)
Glucose-Capillary: 135 mg/dL — ABNORMAL HIGH (ref 70–99)

## 2011-11-16 MED ORDER — DIPHENOXYLATE-ATROPINE 2.5-0.025 MG PO TABS
2.0000 | ORAL_TABLET | Freq: Once | ORAL | Status: AC
  Administered 2011-11-16: 2 via ORAL
  Filled 2011-11-16: qty 2

## 2011-11-16 MED ORDER — NITROFURANTOIN MONOHYD MACRO 100 MG PO CAPS
100.0000 mg | ORAL_CAPSULE | ORAL | Status: AC
  Administered 2011-11-16: 100 mg via ORAL
  Filled 2011-11-16: qty 1

## 2011-11-16 MED ORDER — IBUPROFEN 200 MG PO TABS
600.0000 mg | ORAL_TABLET | Freq: Three times a day (TID) | ORAL | Status: DC | PRN
  Filled 2011-11-16 (×2): qty 3

## 2011-11-16 MED ORDER — INSULIN ASPART 100 UNIT/ML ~~LOC~~ SOLN
0.0000 [IU] | Freq: Three times a day (TID) | SUBCUTANEOUS | Status: DC
  Filled 2011-11-16 (×2): qty 1

## 2011-11-16 MED ORDER — ONDANSETRON 4 MG PO TBDP
ORAL_TABLET | ORAL | Status: AC
  Filled 2011-11-16: qty 2

## 2011-11-16 MED ORDER — ONDANSETRON HCL 8 MG PO TABS
4.0000 mg | ORAL_TABLET | Freq: Three times a day (TID) | ORAL | Status: DC | PRN
  Administered 2011-11-16: 4 mg via ORAL
  Filled 2011-11-16: qty 2
  Filled 2011-11-16: qty 1

## 2011-11-16 MED ORDER — LORAZEPAM 2 MG/ML IJ SOLN
1.0000 mg | Freq: Once | INTRAMUSCULAR | Status: AC
  Administered 2011-11-16: 1 mg via INTRAVENOUS

## 2011-11-16 MED ORDER — ACETAMINOPHEN 325 MG PO TABS: 650.0000 mg | ORAL_TABLET | ORAL | Status: DC | PRN

## 2011-11-16 MED ORDER — FOLIC ACID 1 MG PO TABS
1.0000 mg | ORAL_TABLET | ORAL | Status: DC
  Filled 2011-11-16: qty 1

## 2011-11-16 MED ORDER — LORAZEPAM 2 MG/ML IJ SOLN
1.0000 mg | Freq: Four times a day (QID) | INTRAMUSCULAR | Status: DC | PRN
  Filled 2011-11-16: qty 1

## 2011-11-16 MED ORDER — LORAZEPAM 1 MG PO TABS
1.0000 mg | ORAL_TABLET | Freq: Four times a day (QID) | ORAL | Status: DC | PRN
  Filled 2011-11-16 (×2): qty 2
  Filled 2011-11-16: qty 1

## 2011-11-16 MED ORDER — THERA M PLUS PO TABS
1.0000 | ORAL_TABLET | ORAL | Status: AC
  Administered 2011-11-16: 1 via ORAL
  Filled 2011-11-16: qty 1

## 2011-11-16 MED ORDER — LORAZEPAM 2 MG/ML IJ SOLN
1.0000 mg | Freq: Once | INTRAMUSCULAR | Status: AC
  Administered 2011-11-16: 1 mg via INTRAVENOUS
  Filled 2011-11-16: qty 1

## 2011-11-16 MED ORDER — LORAZEPAM 1 MG PO TABS
0.0000 mg | ORAL_TABLET | Freq: Four times a day (QID) | ORAL | Status: DC
  Administered 2011-11-16 (×3): 2 mg via ORAL
  Administered 2011-11-17 (×2): 1 mg via ORAL
  Filled 2011-11-16: qty 2
  Filled 2011-11-16: qty 1

## 2011-11-16 MED ORDER — THIAMINE HCL 100 MG/ML IJ SOLN: 100.0000 mg | INTRAMUSCULAR | Status: AC

## 2011-11-16 MED ORDER — ONDANSETRON 4 MG PO TBDP
8.0000 mg | ORAL_TABLET | Freq: Three times a day (TID) | ORAL | Status: DC | PRN
  Administered 2011-11-17 (×2): 8 mg via ORAL
  Filled 2011-11-16 (×2): qty 1
  Filled 2011-11-16: qty 2

## 2011-11-16 MED ORDER — THERA M PLUS PO TABS
1.0000 | ORAL_TABLET | Freq: Once | ORAL | Status: DC
  Filled 2011-11-16: qty 1

## 2011-11-16 MED ORDER — LORAZEPAM 1 MG PO TABS: 0.0000 mg | ORAL_TABLET | Freq: Two times a day (BID) | ORAL | Status: DC

## 2011-11-16 MED ORDER — VITAMIN B-1 100 MG PO TABS
100.0000 mg | ORAL_TABLET | ORAL | Status: AC
  Administered 2011-11-16: 100 mg via ORAL
  Filled 2011-11-16: qty 1

## 2011-11-16 MED ORDER — FOLIC ACID 1 MG PO TABS
1.0000 mg | ORAL_TABLET | Freq: Once | ORAL | Status: AC
  Administered 2011-11-16: 1 mg via ORAL
  Filled 2011-11-16: qty 1

## 2011-11-16 MED ORDER — ONDANSETRON HCL 4 MG/2ML IJ SOLN
INTRAMUSCULAR | Status: AC
  Administered 2011-11-16: 4 mg
  Filled 2011-11-16: qty 2

## 2011-11-16 MED ORDER — PROMETHAZINE HCL 25 MG/ML IJ SOLN
12.5000 mg | Freq: Once | INTRAMUSCULAR | Status: AC
  Administered 2011-11-16: 12.5 mg via INTRAVENOUS
  Filled 2011-11-16: qty 1

## 2011-11-16 NOTE — ED Notes (Signed)
Patient currently resting quietly in bed; no respiratory or acute distress noted.  Will continue to monitor. 

## 2011-11-16 NOTE — ED Notes (Signed)
Report given to [place], [place]

## 2011-11-16 NOTE — ED Notes (Signed)
Patient is resting comfortably. 

## 2011-11-16 NOTE — ED Notes (Signed)
Patient denies pain and is resting comfortably.  

## 2011-11-16 NOTE — ED Notes (Signed)
Patient currently asleep in bed; no respiratory or acute distress noted.  Will continue to monitor. 

## 2011-11-16 NOTE — ED Notes (Signed)
Pt. Refused her 2 units of Novolog Insulin,  She stated, "It took me a month to wean off of it, I don't want it."
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
## 2011-11-17 LAB — GLUCOSE, CAPILLARY: Glucose-Capillary: 139 mg/dL — ABNORMAL HIGH (ref 70–99)

## 2011-11-17 NOTE — ED Notes (Signed)
Pt is awaken at this time, ACT at bedside for pt re-evaluation. Plan of care is updated with verbal understanding, will continue to monitor pt.

## 2011-11-17 NOTE — ED Provider Notes (Signed)
  Physical Exam  BP 145/84  Pulse 96  Temp(Src) 98.4 F (36.9 C) (Oral)  Resp 21  SpO2 95%  Physical Exam  ED Course  Procedures  MDM Patient has been accepted at [facility] by Dr. Eliott Nine - will be discharged in the care of her husband for transport. She is voluntary      Vida Roller, MD 11/17/11 [phone]
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
## 2011-11-17 NOTE — ED Notes (Signed)
[facility] at bedside for pt re-evaluation for placement and refusal of insulin medication. Pt reports endocrinologist  "was taking me off of it " now suppose to be taking juniva and metformin. Pt now report to [facility] she knows her meds and dosage, pharmacy tech called for medication reconciliation update.

## 2011-11-17 NOTE — ED Notes (Signed)
Patient is resting on stretcher, ACT personal spoke with patient at length about resources and patient and husband refused to go to [facility] . Patient is waiting on husband to come and pick her up, breakfast tray ordered. Patient is alert and oriented ambulatory with problems.

## 2011-11-17 NOTE — ED Notes (Signed)
[facility] is not access at this time due to pt resting with eyes closed, pt will be assessed upon awakening.VSS, INAD, skin w/d and resp e/u, will continue to monitor pt.

## 2011-11-20 NOTE — ED Provider Notes (Signed)
Medical screening examination/treatment/procedure(s) were performed by non-physician practitioner and as supervising physician I was immediately available for consultation/collaboration.   Suzi Roots, MD 11/20/11 [phone]

## 2011-11-24 ENCOUNTER — Emergency Department (HOSPITAL_COMMUNITY)
Admission: EM | Admit: 2011-11-24 | Discharge: 2011-11-26 | Disposition: A | Discharge status: Other Acute Inpatient Hospital | Payer: 59 | Attending: Emergency Medicine | Admitting: Emergency Medicine

## 2011-11-24 ENCOUNTER — Encounter (HOSPITAL_COMMUNITY): Payer: Self-pay

## 2011-11-24 DIAGNOSIS — F10939 Alcohol use, unspecified with withdrawal, unspecified: Secondary | ICD-10-CM

## 2011-11-24 DIAGNOSIS — H9190 Unspecified hearing loss, unspecified ear: Secondary | ICD-10-CM | POA: Insufficient documentation

## 2011-11-24 DIAGNOSIS — I10 Essential (primary) hypertension: Secondary | ICD-10-CM | POA: Insufficient documentation

## 2011-11-24 DIAGNOSIS — IMO0002 Reserved for concepts with insufficient information to code with codable children: Secondary | ICD-10-CM | POA: Insufficient documentation

## 2011-11-24 DIAGNOSIS — R10811 Right upper quadrant abdominal tenderness: Secondary | ICD-10-CM | POA: Insufficient documentation

## 2011-11-24 DIAGNOSIS — R000 Tachycardia, unspecified: Secondary | ICD-10-CM | POA: Insufficient documentation

## 2011-11-24 DIAGNOSIS — H5704 Mydriasis: Secondary | ICD-10-CM | POA: Insufficient documentation

## 2011-11-24 DIAGNOSIS — R0682 Tachypnea, not elsewhere classified: Secondary | ICD-10-CM | POA: Insufficient documentation

## 2011-11-24 DIAGNOSIS — R259 Unspecified abnormal involuntary movements: Secondary | ICD-10-CM | POA: Insufficient documentation

## 2011-11-24 DIAGNOSIS — R112 Nausea with vomiting, unspecified: Secondary | ICD-10-CM | POA: Insufficient documentation

## 2011-11-24 DIAGNOSIS — R197 Diarrhea, unspecified: Secondary | ICD-10-CM | POA: Insufficient documentation

## 2011-11-24 DIAGNOSIS — R160 Hepatomegaly, not elsewhere classified: Secondary | ICD-10-CM | POA: Insufficient documentation

## 2011-11-24 DIAGNOSIS — E119 Type 2 diabetes mellitus without complications: Secondary | ICD-10-CM | POA: Insufficient documentation

## 2011-11-24 DIAGNOSIS — Z7982 Long term (current) use of aspirin: Secondary | ICD-10-CM | POA: Insufficient documentation

## 2011-11-24 DIAGNOSIS — Z79899 Other long term (current) drug therapy: Secondary | ICD-10-CM | POA: Insufficient documentation

## 2011-11-24 DIAGNOSIS — F17200 Nicotine dependence, unspecified, uncomplicated: Secondary | ICD-10-CM | POA: Insufficient documentation

## 2011-11-24 DIAGNOSIS — F10239 Alcohol dependence with withdrawal, unspecified: Secondary | ICD-10-CM | POA: Insufficient documentation

## 2011-11-24 LAB — COMPREHENSIVE METABOLIC PANEL
ALT: 46 U/L — ABNORMAL HIGH (ref 0–35)
AST: 87 U/L — ABNORMAL HIGH (ref 0–37)
Albumin: 4.2 g/dL (ref 3.5–5.2)
Alkaline Phosphatase: 122 U/L — ABNORMAL HIGH (ref 39–117)
BUN: 12 mg/dL (ref 6–23)
CO2: 26 mEq/L (ref 19–32)
Calcium: 8.7 mg/dL (ref 8.4–10.5)
Chloride: 87 mEq/L — ABNORMAL LOW (ref 96–112)
Creatinine, Ser: 0.68 mg/dL (ref 0.50–1.10)
GFR calc Af Amer: >90 mL/min (ref >90)
GFR calc non Af Amer: >90 mL/min (ref >90)
Glucose, Bld: 174 mg/dL — ABNORMAL HIGH (ref 70–99)
Potassium: 3.7 mEq/L (ref 3.5–5.1)
Sodium: 133 mEq/L — ABNORMAL LOW (ref 135–145)
Total Bilirubin: 0.6 mg/dL (ref 0.3–1.2)
Total Protein: 8.5 g/dL — ABNORMAL HIGH (ref 6.0–8.3)

## 2011-11-24 LAB — CBC
HCT: 39.3 % (ref 36.0–46.0)
Hemoglobin: 13.8 g/dL (ref 12.0–15.0)
MCH: 32.9 pg (ref 26.0–34.0)
MCHC: 35.1 g/dL (ref 30.0–36.0)
MCV: 93.6 fL (ref 78.0–100.0)
Platelets: 204 10*3/uL (ref 150–400)
RBC: 4.2 MIL/uL (ref 3.87–5.11)
RDW: 14 % (ref 11.5–15.5)
WBC: 10.1 10*3/uL (ref 4.0–10.5)

## 2011-11-24 LAB — MAGNESIUM: Magnesium: 2.5 mg/dL (ref 1.5–2.5)

## 2011-11-24 LAB — ETHANOL: Alcohol, Ethyl (B): 229 mg/dL — ABNORMAL HIGH (ref 0–11)

## 2011-11-24 MED ORDER — SODIUM CHLORIDE 0.9 % IV SOLN
Freq: Once | INTRAVENOUS | Status: DC
Start: 2011-11-24 — End: 2011-11-25

## 2011-11-24 MED ORDER — LORAZEPAM 2 MG/ML IJ SOLN
2.0000 mg | INTRAMUSCULAR | Status: AC
  Administered 2011-11-24: 2 mg via INTRAVENOUS
  Filled 2011-11-24: qty 1

## 2011-11-24 MED ORDER — ONDANSETRON HCL 8 MG PO TABS
4.0000 mg | ORAL_TABLET | Freq: Three times a day (TID) | ORAL | Status: DC | PRN
  Administered 2011-11-25: 8 mg via ORAL
  Filled 2011-11-24 (×2): qty 1

## 2011-11-24 MED ORDER — LISINOPRIL 5 MG PO TABS
5.0000 mg | ORAL_TABLET | ORAL | Status: AC
  Administered 2011-11-24: 5 mg via ORAL
  Filled 2011-11-24: qty 1

## 2011-11-24 MED ORDER — PANTOPRAZOLE SODIUM 40 MG PO TBEC
40.0000 mg | DELAYED_RELEASE_TABLET | Freq: Every day | ORAL | Status: DC
  Administered 2011-11-24 – 2011-11-25 (×2): 40 mg via ORAL
  Filled 2011-11-24 (×2): qty 1

## 2011-11-24 MED ORDER — ONDANSETRON HCL 4 MG/2ML IJ SOLN
4.0000 mg | INTRAMUSCULAR | Status: AC
  Administered 2011-11-24 (×2): 4 mg via INTRAVENOUS

## 2011-11-24 MED ORDER — THIAMINE HCL 100 MG/ML IJ SOLN
Freq: Once | INTRAVENOUS | Status: AC
  Administered 2011-11-24: 11:00:00 via INTRAVENOUS
  Filled 2011-11-24: qty 1000

## 2011-11-24 MED ORDER — PROMETHAZINE HCL 12.5 MG PO TABS
12.5000 mg | ORAL_TABLET | Freq: Four times a day (QID) | ORAL | Status: DC | PRN
  Administered 2011-11-24 (×2): 12.5 mg via ORAL
  Filled 2011-11-24 (×3): qty 1

## 2011-11-24 MED ORDER — LORAZEPAM 2 MG/ML IJ SOLN
0.5000 mg | Freq: Once | INTRAMUSCULAR | Status: AC
  Administered 2011-11-24: 0.5 mg via INTRAVENOUS
  Filled 2011-11-24: qty 1

## 2011-11-24 MED ORDER — NEBIVOLOL HCL 5 MG PO TABS
5.0000 mg | ORAL_TABLET | ORAL | Status: AC
  Administered 2011-11-24: 5 mg via ORAL
  Filled 2011-11-24: qty 1

## 2011-11-24 MED ORDER — ONDANSETRON HCL 4 MG/2ML IJ SOLN
4.0000 mg | Freq: Four times a day (QID) | INTRAMUSCULAR | Status: DC
  Administered 2011-11-24 – 2011-11-26 (×4): 4 mg via INTRAVENOUS
  Filled 2011-11-24 (×4): qty 2

## 2011-11-24 MED ORDER — LORAZEPAM 1 MG PO TABS
0.5000 mg | ORAL_TABLET | Freq: Four times a day (QID) | ORAL | Status: DC | PRN
  Administered 2011-11-24: 1 mg via ORAL
  Filled 2011-11-24: qty 1

## 2011-11-24 MED ORDER — METOCLOPRAMIDE HCL 5 MG/ML IJ SOLN: 5.0000 mg | Freq: Once | INTRAMUSCULAR | Status: DC

## 2011-11-24 MED ORDER — ONDANSETRON HCL 4 MG/2ML IJ SOLN
INTRAMUSCULAR | Status: AC
  Administered 2011-11-24: 4 mg via INTRAVENOUS
  Filled 2011-11-24: qty 2

## 2011-11-24 MED ORDER — ONDANSETRON HCL 4 MG/2ML IJ SOLN
INTRAMUSCULAR | Status: AC
  Filled 2011-11-24: qty 2

## 2011-11-24 MED ORDER — ACETAMINOPHEN 325 MG PO TABS: 325.0000 mg | ORAL_TABLET | Freq: Four times a day (QID) | ORAL | Status: DC | PRN

## 2011-11-24 MED ORDER — LORAZEPAM 1 MG PO TABS
0.5000 mg | ORAL_TABLET | Freq: Four times a day (QID) | ORAL | Status: DC | PRN
  Administered 2011-11-24 – 2011-11-26 (×7): 1 mg via ORAL
  Filled 2011-11-24 (×7): qty 1

## 2011-11-24 MED ORDER — ZOLPIDEM TARTRATE 5 MG PO TABS
5.0000 mg | ORAL_TABLET | Freq: Every evening | ORAL | Status: DC | PRN
  Administered 2011-11-25: 5 mg via ORAL
  Filled 2011-11-24 (×2): qty 1

## 2011-11-24 NOTE — ED Notes (Signed)
O2 sats dropped while pt was sleeping. Would periodically check on pt and tell her to inhale deeply to bring sats up. Got permission from EDP to give 2L O2 via [place].

## 2011-11-24 NOTE — ED Notes (Signed)
Pt is sleeping at present time. 

## 2011-11-24 NOTE — BH Assessment (Signed)
Assessment Note   [name] is an 65 yo MWF that presented to [facility] requesting detox for alcohol.  Pt has a longstanding hx of same and currently drinks up to 12 glasses + of wine QD for years.  Pt is currently on probation for a DWI and is concerned about how this will affect her probationary status.  Pt was here for same last week but there were no beds at [facility], so patient left after two days.  Pt has been drinking since her discharge.  Pt is currently restless, complaining of pain, nausea and vomiting and agitation with a CIWA score of 19.  Pt denies SI, HI, or any active psychosis at this time and is able to contract for safety.  Pt believes that she cannot detox on her own.  Axis I: Alcohol Abuse Axis II: Deferred Axis III:  Past Medical History  Diagnosis Date  . Alcohol abuse   . Diabetes mellitus   . Hypertension    Axis IV: problems related to legal system/crime, problems related to social environment and problems with access to health care services Axis V: 31-40 impairment in reality testing  Past Medical History:  Past Medical History  Diagnosis Date  . Alcohol abuse   . Diabetes mellitus   . Hypertension     Past Surgical History  Procedure Date  . Cesarean section   . Tonsillectomy   . Bunionectomy     Family History: History reviewed. No pertinent family history.  Social History:  reports that she has been smoking.  She does not have any smokeless tobacco history on file. She reports that she drinks alcohol. She reports that she does not use illicit drugs.  Additional Social History:  Alcohol / Drug Use Pain Medications: ativan Prescriptions: several-see medication rec Over the Counter: none History of alcohol / drug use?: Yes Longest period of sobriety (when/how long): weeks Negative Consequences of Use: Financial;Legal;Personal relationships;Work / School Withdrawal Symptoms: Agitation;Blackouts;Fever / Chills;Irritability;Nausea /  Vomiting;Patient aware of relationship between substance abuse and physical/medical complications;Sweats;Tachycardia;Tremors;Weakness Substance #1 Name of Substance 1: alcohol 1 - Age of First Use: teens 1 - Amount (size/oz): 12 glasses 1 - Frequency: QD 1 - Duration: past week + 1 - Last Use / Amount: this am Allergies:  Allergies  Allergen Reactions  . Codeine Other (See Comments)    UNKNOWN   . Latex Other (See Comments)    Break outs  . Pentazocine Lactate   . Sulfonamide Derivatives Rash    Home Medications:  Medications Prior to Admission  Medication Dose Route Frequency Provider Last Rate Last Dose  . 0.9 %  sodium chloride infusion   Intravenous Once Hurman Horn, MD      . LORazepam (ATIVAN) injection 0.5 mg  0.5 mg Intravenous Once Quentin Ore, MD   0.5 mg at 11/24/11 1203  . LORazepam (ATIVAN) injection 2 mg  2 mg Intravenous NOW Quentin Ore, MD   2 mg at 11/24/11 0813  . LORazepam (ATIVAN) tablet 0.5-1 mg  0.5-1 mg Oral Q6H PRN Quentin Ore, MD   1 mg at 11/24/11 1420  . ondansetron (ZOFRAN) injection 4 mg  4 mg Intravenous NOW Quentin Ore, MD   4 mg at 11/24/11 1110  . ondansetron (ZOFRAN) injection 4 mg  4 mg Intravenous Q6H Quentin Ore, MD      . promethazine (PHENERGAN) tablet 12.5 mg  12.5 mg Oral Q6H PRN Quentin Ore, MD   12.5 mg at 11/24/11 1423  .  sodium chloride 0.9 % 1,000 mL with thiamine 100 mg, folic acid 1 mg, multivitamins adult 10 mL infusion   Intravenous Once Quentin Ore, MD 150 mL/hr at 11/24/11 1042    . DISCONTD: metoCLOPramide (REGLAN) injection 5 mg  5 mg Intravenous Once Quentin Ore, MD       Medications Prior to Admission  Medication Sig Dispense Refill  . aspirin 81 MG chewable tablet Chew 1 tablet (81 mg total) by mouth daily.  30 tablet  0  . ezetimibe (ZETIA) 10 MG tablet Take 10 mg by mouth daily.        Marland Kitchen HYDROcodone-acetaminophen (NORCO) 5-325 MG per tablet Take 1 tablet by mouth every 6 (six) hours as needed.  For pain      . lisinopril (PRINIVIL,ZESTRIL) 5 MG tablet Take 5 mg by mouth daily.        . metFORMIN (GLUCOPHAGE-XR) 750 MG 24 hr tablet Take 750 mg by mouth 2 (two) times daily.       . nebivolol (BYSTOLIC) 5 MG tablet Take 5 mg by mouth daily.        . Omega-3 Fatty Acids (FISH OIL) 1000 MG CPDR Take 4 capsules by mouth at bedtime. For a total of 4000mg  daily       . sitaGLIPtin (JANUVIA) 100 MG tablet Take 100 mg by mouth daily.          OB/GYN Status:  No LMP recorded.  General Assessment Data Assessment Number: 1  Living Arrangements: Spouse/significant other Can pt return to current living arrangement?: Yes Admission Status: Voluntary Is patient capable of signing voluntary admission?: Yes Transfer from: Acute Hospital Referral Source: Self/Family/Friend  Education Status Is patient currently in school?: No  Risk to self Suicidal Ideation: No Suicidal Intent: No-Not Currently/Within Last 6 Months Is patient at risk for suicide?: No Suicidal Plan?: No Access to Means: No What has been your use of drugs/alcohol within the last 12 months?: drinking 12 glasses + of wine since ER d/c  Previous Attempts/Gestures: No Other Self Harm Risks: n/a Triggers for Past Attempts: None known Intentional Self Injurious Behavior: None Factors that decrease suicide risk: Sense of responsibility to family Family Suicide History: Yes Recent stressful life event(s): Legal Issues Persecutory voices/beliefs?: No Depression: Yes Depression Symptoms: Despondent;Isolating;Fatigue;Guilt;Loss of interest in usual pleasures;Feeling worthless/self pity Substance abuse history and/or treatment for substance abuse?: Yes Suicide prevention information given to non-admitted patients: Not applicable  Risk to Others Homicidal Ideation: No Thoughts of Harm to Others: No Current Homicidal Intent: No Current Homicidal Plan: No Access to Homicidal Means: No Identified Victim: none History of harm to  others?: No Assessment of Violence: None Noted Violent Behavior Description: none Does patient have access to weapons?: No Criminal Charges Pending?: Yes Describe Pending Criminal Charges: on probation for a DWI Does patient have a court date: No  Psychosis Hallucinations: None noted Delusions: None noted  Mental Status Report Appear/Hygiene: Disheveled Eye Contact: Fair Motor Activity: Restlessness Speech: Logical/coherent Level of Consciousness: Quiet/awake Mood: Depressed Affect: Depressed Anxiety Level: Severe Thought Processes: Coherent Judgement: Impaired Orientation: Person;Place;Time;Situation Obsessive Compulsive Thoughts/Behaviors: Moderate  Cognitive Functioning Concentration: Decreased Memory: Recent Impaired;Remote Impaired IQ: Average Insight: Fair Impulse Control: Poor Appetite: Fair Weight Loss: 0  Weight Gain: 0  Sleep: Decreased Total Hours of Sleep: 0  (less than 5 hrs QD) Vegetative Symptoms: Staying in bed Vegetative Symptoms: Staying in bed;Decreased grooming  Prior Inpatient Therapy Prior Inpatient Therapy: Yes Prior Therapy Dates: 09/12 Prior Therapy Facilty/Provider(s): [facility] Reason for  Treatment: detox  Prior Outpatient Therapy Prior Outpatient Therapy: Yes Prior Therapy Dates: for over 20 years off and on Prior Therapy Facilty/Provider(s): in [place]  Reason for Treatment: substance abuse            Values / Beliefs Cultural Requests During Hospitalization: None Spiritual Requests During Hospitalization: None        Additional Information 1:1 In Past 12 Months?: No CIRT Risk: No Elopement Risk: No Does patient have medical clearance?: Yes     Disposition:  Disposition Disposition of Patient: Referred to Type of inpatient treatment program: Adult  On Site Evaluation by:   Reviewed with Physician:     Angelica Ran 11/24/2011 3:15 PM

## 2011-11-24 NOTE — ED Notes (Signed)
Pt resting quietly at the time. Vital signs stable. Given snack and drink. No signs of distress at the present.

## 2011-11-24 NOTE — ED Notes (Signed)
[name] with ACT team at the patient bedside doing intake assessment

## 2011-11-24 NOTE — ED Provider Notes (Signed)
I have personally seen and examined the patient.  I have discussed the plan of care with the resident.  I have reviewed the documentation on PMH/FH/Soc. History.  I have reviewed the documentation of the resident and agree.  Doug Sou, MD 11/24/11 1737

## 2011-11-24 NOTE — ED Notes (Signed)
Pt placed on bedpan per her request but she was not able to urinate. Still need urine for drug screen. Pt aware of need for urine.

## 2011-11-24 NOTE — ED Notes (Signed)
Discussed with patient her plan and what she wants from [place] to help her,  Is she wanting placement in a facility??  Pt unsure at this time

## 2011-11-24 NOTE — ED Notes (Signed)
Call from [facility] regarding pt level of withdrawl

## 2011-11-24 NOTE — ED Provider Notes (Signed)
History     CSN: [id number] Arrival date & time: 11/24/2011  7:29 AM   First MD Initiated Contact with Patient 11/24/11 [phone]      Chief Complaint  Patient presents with  . Delirium Tremens (DTS)    Pt reports in withdrawl, last drink 2 hours ago, drinks wine    (Consider location/radiation/quality/duration/timing/severity/associated sxs/prior treatment) HPI  [name] is a 65 year old woman with PMH depression and alcohol dependence who presents this morning for alcohol withdrawal.  [name] states after her last discharge on the 3rd that she immediately resumed drinking. She has been consuming about 2 L of wine per day. She attempted to wean herself from alcohol over the past couple of days but was unsuccessful. She states she is currently in withdrawal.  Her last  Drink was about 3 AM. She endorses auditory hallucinations with last one being this morning. She states that she is very shaky and nervous. She has not eaten for 4 days. She endorses nausea, vomiting and diarrhea that corresponds with the onset of her withdrawals. She denies pain, specifically headache, chest pain, or abdominal pain. She denies SOB. Denies melena or blood in her urine, stool or vomit. She has a history of alcohol seizure "over 40 years ago" in the setting of drug and alcohol use. Currently. she denies any other ingestions besides alcohol.   Past Medical History  Diagnosis Date  . Alcohol abuse   . Diabetes mellitus   . Hypertension     Past Surgical History  Procedure Date  . Cesarean section   . Tonsillectomy   . Bunionectomy     History reviewed. No pertinent family history.  History  Substance Use Topics  . Smoking status: Current Some Day Smoker  . Smokeless tobacco: Not on file  . Alcohol Use: Yes     4-5 bottles of wine per day  Denies drug or other ingestion.   OB History    Grav Para Term Preterm Abortions TAB SAB Ect Mult Living                  Review of Systems    Constitutional: Positive for appetite change. Negative for fever, chills and diaphoresis.  HENT: Positive for hearing loss.   Eyes: Negative for visual disturbance.  Respiratory: Negative for cough, chest tightness, shortness of breath and wheezing.   Cardiovascular: Negative for chest pain, palpitations and leg swelling.  Gastrointestinal: Positive for nausea, vomiting and diarrhea. Negative for abdominal pain, constipation, blood in stool and abdominal distention.  Genitourinary: Negative for dysuria, frequency, flank pain and difficulty urinating.  Musculoskeletal: Negative for back pain and arthralgias.  Skin: Negative for pallor and rash.  Neurological: Positive for tremors. Negative for dizziness, seizures, syncope, light-headedness, numbness and headaches.  Psychiatric/Behavioral: Positive for hallucinations, dysphoric mood and agitation. Negative for behavioral problems, confusion and decreased concentration.    Allergies  Codeine; Latex; Pentazocine lactate; and Sulfonamide derivatives  Home Medications   Current Outpatient Rx  Name Route Sig Dispense Refill  . ASPIRIN 81 MG PO CHEW Oral Chew 1 tablet (81 mg total) by mouth daily. 30 tablet 0  . DIPHENOXYLATE-ATROPINE 2.5-0.025 MG PO TABS Oral Take 2 tablets by mouth 2 (two) times daily as needed. diarrhea     . EZETIMIBE 10 MG PO TABS Oral Take 10 mg by mouth daily.      Marland Kitchen FOLIC ACID 1 MG PO TABS Oral Take 1 mg by mouth daily.      Marland Kitchen  HYDROCODONE-ACETAMINOPHEN 5-325 MG PO TABS Oral Take 1 tablet by mouth every 6 (six) hours as needed. For pain    . LISINOPRIL 5 MG PO TABS Oral Take 5 mg by mouth daily.      Marland Kitchen MAGNESIA PO Oral Take 1 tablet by mouth daily.      Marland Kitchen METFORMIN HCL ER 750 MG PO TB24 Oral Take 750 mg by mouth 2 (two) times daily.     . NEBIVOLOL HCL 5 MG PO TABS Oral Take 5 mg by mouth daily.      Marland Kitchen FISH OIL 1000 MG PO CPDR Oral Take 4 capsules by mouth at bedtime. For a total of 4000mg  daily     . SITAGLIPTIN  PHOSPHATE 100 MG PO TABS Oral Take 100 mg by mouth daily.        BP 119/59  Pulse 67  Temp(Src) 98.3 F (36.8 C) (Oral)  Resp 16  SpO2 100%  Physical Exam  Nursing note and vitals reviewed. Constitutional: She is oriented to person, place, and time. She appears well-developed. She is active.       Agitated appearing  HENT:  Head: Normocephalic.  Mouth/Throat: Oropharynx is clear and moist and mucous membranes are normal. Abnormal dentition. No oropharyngeal exudate.  Eyes: Pupils are equal, round, and reactive to light. Right eye exhibits normal extraocular motion and no nystagmus. Left eye exhibits normal extraocular motion and no nystagmus.       Mydriasis present equally and bilaterally  Neck: No JVD present.  Cardiovascular: Regular rhythm and normal heart sounds.   Occasional extrasystoles are present. Tachycardia present.  Exam reveals no gallop and no friction rub.   No murmur heard. Pulmonary/Chest: Breath sounds normal. Tachypnea noted. No respiratory distress. She has no wheezes. She has no rales. She exhibits no tenderness.  Abdominal: Soft. Bowel sounds are normal. She exhibits no distension. There is hepatomegaly. There is tenderness in the right upper quadrant. There is no rigidity, no rebound, no guarding, no CVA tenderness, no tenderness at McBurney's point and negative Murphy's sign.  Musculoskeletal: She exhibits no edema.  Neurological: She is alert and oriented to person, place, and time. She displays tremor. No cranial nerve deficit.  Skin: Skin is warm and dry. No rash noted. No erythema.  Psychiatric: Her speech is normal. Judgment and thought content normal. Her mood appears anxious. Her affect is not inappropriate. She is not actively hallucinating. Cognition and memory are normal.    ED Course  Procedures (including critical care time)  Labs Reviewed  ETHANOL - Abnormal; Notable for the following:    Alcohol, Ethyl (B) 229 (*)    All other components  within normal limits  COMPREHENSIVE METABOLIC PANEL - Abnormal; Notable for the following:    Sodium 133 (*)    Chloride 87 (*)    Glucose, Bld 174 (*)    Total Protein 8.5 (*)    AST 87 (*)    ALT 46 (*)    Alkaline Phosphatase 122 (*)    All other components within normal limits  CBC  MAGNESIUM  DRUG SCREEN PANEL (SERUM)   No results found.   No diagnosis found.    MDM  [name] is currently in mild to moderate alcohol withdrawal. She is currently having some auditory hallucinations, but is otherwise A&O x 3 with no delirium.  Will get basic labs and will treat with banana bag and ativan prn to make her medically stable enough to be transferred for further detox. EtOH  induced hepatitis. Will assess further when after less hyperactive from withdrawal. Ativan 2 mg IV initially given with great reduction in nervousness and withdrawal symptoms. Pt sleeping comfortably and easily arousable 15 min post administration.  -- 8:56 Called for pulse ox reading in the 80s.  Pt sleeping and easily arousable. With deep inspiration, pt 02 sat improved to low 90s. Adjusted position in bed and administered oxygen 2l by  with pulse ox reading 100%.    -- 10:32 AM patient had continued nausea with dry heaves. Gave additional dose of ondansetron IV and will begin by mouth promethazine.  -- 3:22 PM Patient is able to take by mouth Ativan and promethazine without vomiting. Is able to drink water and ginger ale without difficulty. ACT team has been called however they've not yet seen the patient.   -- 3:31 PM spoke with ACT team RN who stated they will be admitting the patient when a bed is available. Pt's o2 sat 97% on room air Quentin Ore, MD 11/24/11 1523  Quentin Ore, MD 11/24/11 1554  Quentin Ore, MD 11/24/11 [phone]

## 2011-11-24 NOTE — ED Notes (Signed)
Pt is not a trauma

## 2011-11-24 NOTE — ED Provider Notes (Signed)
Seen with resident physician. Patient requesting help with alcohol. Patient feels shaky. Reports that she thought her husband was on the phone 3 AM today but he was not. On exam she is alert appropriate Glasgow Coma Score 15. Patient not felt to be in delirium tremens tremens presently patient felt to be in mild to moderate alcohol withdrawal with tremors.  Doug Sou, MD 11/24/11 [phone]

## 2011-11-25 ENCOUNTER — Encounter (HOSPITAL_COMMUNITY): Payer: Self-pay | Admitting: Emergency Medicine

## 2011-11-25 LAB — URINALYSIS, ROUTINE W REFLEX MICROSCOPIC
Bilirubin Urine: NEGATIVE
Glucose, UA: NEGATIVE mg/dL
Ketones, ur: NEGATIVE mg/dL
Nitrite: NEGATIVE
Protein, ur: NEGATIVE mg/dL
Specific Gravity, Urine: 1.014 (ref 1.005–1.030)
Urobilinogen, UA: 1 mg/dL (ref 0.0–1.0)
pH: 7.5 (ref 5.0–8.0)

## 2011-11-25 LAB — URINE MICROSCOPIC-ADD ON

## 2011-11-25 MED ORDER — FAMOTIDINE 20 MG PO TABS
20.0000 mg | ORAL_TABLET | Freq: Once | ORAL | Status: AC
  Administered 2011-11-25: 20 mg via ORAL
  Filled 2011-11-25: qty 1

## 2011-11-25 NOTE — ED Notes (Signed)
[name], ACT Team at bedside  

## 2011-11-25 NOTE — ED Notes (Signed)
Pt wanting diarrhea medication, nausea medication and ativan. Instructed pt nausea meds are ordered q 6hrs. Pt not actively having diarrhea d/t pt using bsc. Able to see bm's. Pt medicated with pepcid per ER physician order for diarrhea. Pt states " i want ativan" explained to pt meds are ordered q6hrs. Pt not actively having any vomiting or anxiety or shakes. Pt laying in bed watching tv with husband at bedside. Pt continues to wait for detox facility.

## 2011-11-25 NOTE — ED Notes (Signed)
Pt resting quietly at the time. No signs of distress noted. Vital signs stable. 

## 2011-11-25 NOTE — ED Notes (Signed)
Report received, assumed care.  

## 2011-11-25 NOTE — ED Notes (Addendum)
Resting, eyes closed, easily aroused. Lunch tray received but ate very little. Reports not hungry. Pt calm & cooperative. NAD.

## 2011-11-25 NOTE — ED Notes (Signed)
Set up with AM meal.

## 2011-11-25 NOTE — ED Notes (Signed)
MD at bedside. 

## 2011-11-25 NOTE — ED Notes (Signed)
Pt resting quietly with eyes closed at the time. Vital signs stable. No signs of distress at the time.

## 2011-11-25 NOTE — ED Notes (Signed)
Ordered dinner 

## 2011-11-26 LAB — GLUCOSE, CAPILLARY: Glucose-Capillary: 151 mg/dL — ABNORMAL HIGH (ref 70–99)

## 2011-11-26 MED ORDER — DIPHENOXYLATE-ATROPINE 2.5-0.025 MG PO TABS
1.0000 | ORAL_TABLET | Freq: Once | ORAL | Status: AC
  Administered 2011-11-26: 1 via ORAL
  Filled 2011-11-26: qty 1

## 2011-11-26 MED ORDER — CIPROFLOXACIN HCL 500 MG PO TABS
500.0000 mg | ORAL_TABLET | Freq: Two times a day (BID) | ORAL | Status: DC
  Administered 2011-11-26: 500 mg via ORAL
  Filled 2011-11-26: qty 1

## 2011-11-26 NOTE — ED Notes (Signed)
Patient resting with eyes closed. resp even unlabored. Skin w/d.

## 2011-11-26 NOTE — ED Provider Notes (Addendum)
Pt seen and examined this morning.  She is resting comfortably this morning.  Vitals signs are stable.  Awaiting placement. Reviewed UA.  Will start on po cipro. Celene Kras, MD 11/26/11 6213  Celene Kras, MD 11/26/11 [phone]  7:53 AM Pt has been accepted at [facility] for treatment by Dr Danne Harbor.  Celene Kras, MD 11/26/11 [phone]

## 2011-11-26 NOTE — ED Notes (Signed)
ED visit summary and copy of signed EMTALA form placed in envelope and given to pt to take with her to [facility].

## 2011-11-26 NOTE — ED Notes (Signed)
Breakfast reg ordered; non-sharps

## 2011-11-26 NOTE — ED Notes (Signed)
Pt asking for lomotil before leaving - states having diarrhea.  Dr. Lynelle Doctor notified and verbal order given.

## 2011-11-26 NOTE — BH Assessment (Signed)
Assessment Note   [name] is an 65 y.o. female.  [name] was re-assessed at [facility]  She is being considered for a detox bed at [facility].  This clinician talked to her and she is still denying SI, HI or A/V hallucinations.  [name] does admit to depression.  She is also worried about her PO finding out she has been seeking help with her ETOH addiction as this may be considered a violation of her probation.  [name] at [facility] said that they needed the last day's worth of vitals, those were sent to them. 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 Alcohol abuse   . Diabetes mellitus   . Hypertension    Axis IV: problems related to legal system/crime Axis V: 31-40 impairment in reality testing  Past Medical History:  Past Medical History  Diagnosis Date  . Alcohol abuse   . Diabetes mellitus   . Hypertension     Past Surgical History  Procedure Date  . Cesarean section   . Tonsillectomy   . Bunionectomy     Family History: History reviewed. No pertinent family history.  Social History:  reports that she has been smoking.  She does not have any smokeless tobacco history on file. She reports that she drinks alcohol. She reports that she does not use illicit drugs.  Additional Social History:  Alcohol / Drug Use Pain Medications: ativan Prescriptions: several-see medication rec Over the Counter: none History of alcohol / drug use?: Yes Longest period of sobriety (when/how long): weeks Negative Consequences of Use: Financial;Legal;Personal relationships;Work / School Withdrawal Symptoms: Agitation;Blackouts;Fever / Chills;Irritability;Nausea / Vomiting;Patient aware of relationship between substance abuse and physical/medical complications;Sweats;Tachycardia;Tremors;Weakness Substance #1 Name of Substance 1: alcohol 1 - Age of First Use: teens 1 - Amount (size/oz): 12 glasses 1 -  Frequency: QD 1 - Duration: past week + 1 - Last Use / Amount: this am Allergies:  Allergies  Allergen Reactions  . Codeine Other (See Comments)    UNKNOWN   . Latex Other (See Comments)    Break outs  . Pentazocine Lactate   . Sulfonamide Derivatives Rash    Home Medications:  Medications Prior to Admission  Medication Dose Route Frequency Provider Last Rate Last Dose  . acetaminophen (TYLENOL) tablet 325 mg  325 mg Oral Q6H PRN Quentin Ore, MD      . famotidine (PEPCID) tablet 20 mg  20 mg Oral Once Felisa Bonier, MD   20 mg at 11/25/11 1910  . lisinopril (PRINIVIL,ZESTRIL) tablet 5 mg  5 mg Oral To Major Quentin Ore, MD   5 mg at 11/24/11 1726  . LORazepam (ATIVAN) injection 0.5 mg  0.5 mg Intravenous Once Quentin Ore, MD   0.5 mg at 11/24/11 1203  . LORazepam (ATIVAN) injection 2 mg  2 mg Intravenous NOW Quentin Ore, MD   2 mg at 11/24/11 0813  . LORazepam (ATIVAN) tablet 0.5-2 mg  0.5-2 mg Oral Q6H PRN Quentin Ore, MD   1 mg at 11/25/11 2133  . nebivolol (BYSTOLIC) tablet 5 mg  5 mg Oral To Major Quentin Ore, MD   5 mg at 11/24/11 1714  . ondansetron (ZOFRAN) injection 4 mg  4 mg Intravenous NOW Quentin Ore, MD   4 mg at 11/24/11 1110  . ondansetron (ZOFRAN) injection 4 mg  4 mg Intravenous Q6H Quentin Ore, MD   4 mg at 11/25/11 1517  .  ondansetron (ZOFRAN) tablet 4 mg  4 mg Oral Q8H PRN Quentin Ore, MD   8 mg at 11/25/11 2134  . pantoprazole (PROTONIX) EC tablet 40 mg  40 mg Oral Q1200 Quentin Ore, MD   40 mg at 11/25/11 1516  . promethazine (PHENERGAN) tablet 12.5 mg  12.5 mg Oral Q6H PRN Quentin Ore, MD   12.5 mg at 11/24/11 2243  . sodium chloride 0.9 % 1,000 mL with thiamine 100 mg, folic acid 1 mg, multivitamins adult 10 mL infusion   Intravenous Once Quentin Ore, MD      . zolpidem (AMBIEN) tablet 5 mg  5 mg Oral QHS PRN Quentin Ore, MD   5 mg at 11/25/11 2318  . DISCONTD: 0.9 %  sodium chloride infusion   Intravenous Once Hurman Horn, MD      . DISCONTD: LORazepam (ATIVAN) tablet 0.5-1 mg  0.5-1 mg Oral Q6H PRN Quentin Ore, MD   1 mg at 11/24/11 1420  . DISCONTD: metoCLOPramide (REGLAN) injection 5 mg  5 mg Intravenous Once Quentin Ore, MD       Medications Prior to Admission  Medication Sig Dispense Refill  . aspirin 81 MG chewable tablet Chew 1 tablet (81 mg total) by mouth daily.  30 tablet  0  . ezetimibe (ZETIA) 10 MG tablet Take 10 mg by mouth daily.        Marland Kitchen HYDROcodone-acetaminophen (NORCO) 5-325 MG per tablet Take 1 tablet by mouth every 6 (six) hours as needed. For pain      . lisinopril (PRINIVIL,ZESTRIL) 5 MG tablet Take 5 mg by mouth daily.        . metFORMIN (GLUCOPHAGE-XR) 750 MG 24 hr tablet Take 750 mg by mouth 2 (two) times daily.       . nebivolol (BYSTOLIC) 5 MG tablet Take 5 mg by mouth daily.        . Omega-3 Fatty Acids (FISH OIL) 1000 MG CPDR Take 4 capsules by mouth at bedtime. For a total of 4000mg  daily       . sitaGLIPtin (JANUVIA) 100 MG tablet Take 100 mg by mouth daily.          OB/GYN Status:  No LMP recorded.  General Assessment Data Location of Assessment: [facility] ED ACT Assessment: Yes Living Arrangements: Spouse/significant other Can pt return to current living arrangement?: Yes Admission Status: Voluntary Is patient capable of signing voluntary admission?: Yes Transfer from: Acute Hospital Referral Source: Self/Family/Friend  Education Status Is patient currently in school?: No  Risk to self Suicidal Ideation: No Suicidal Intent: No-Not Currently/Within Last 6 Months Is patient at risk for suicide?: No Suicidal Plan?: No Access to Means: No What has been your use of drugs/alcohol within the last 12 months?:  (ETOH consumption) Previous Attempts/Gestures: No How many times?:  (N/A) Other Self Harm Risks:  (N/A) Triggers for Past Attempts: None known Intentional Self Injurious Behavior: None Factors that decrease suicide risk: Sense of responsibility to  family Family Suicide History: Yes Recent stressful life event(s): Legal Issues Persecutory voices/beliefs?: No Depression: Yes Depression Symptoms: Despondent;Isolating;Fatigue;Guilt;Feeling worthless/self pity Substance abuse history and/or treatment for substance abuse?: Yes Suicide prevention information given to non-admitted patients: Not applicable  Risk to Others Homicidal Ideation: No Thoughts of Harm to Others: No Current Homicidal Intent: No Current Homicidal Plan: No Access to Homicidal Means: No Identified Victim:  (No one) History of harm to others?: No Assessment of Violence: None Noted Violent Behavior Description:  (None) Does patient have access  to weapons?: No Criminal Charges Pending?: Yes Describe Pending Criminal Charges: On probation for a DUI Does patient have a court date:  (No but a PO checks on her regularly)  Psychosis Hallucinations: None noted Delusions: None noted  Mental Status Report Appear/Hygiene: Disheveled Eye Contact: Good Motor Activity: Unremarkable Speech: Logical/coherent Level of Consciousness: Quiet/awake Mood: Depressed;Sad Affect: Sad Anxiety Level: Severe Thought Processes: Coherent;Relevant Judgement: Impaired Orientation: Person;Place;Time;Situation Obsessive Compulsive Thoughts/Behaviors: Moderate  Cognitive Functioning Concentration: Decreased Memory: Recent Impaired;Remote Impaired IQ: Average Insight: Fair Impulse Control: Poor Appetite: Fair Weight Loss:  (Unknown) Weight Gain:  (Unknown) Sleep: Decreased Total Hours of Sleep:  (<5H/D) Vegetative Symptoms: Staying in bed Vegetative Symptoms: Staying in bed;Decreased grooming  Prior Inpatient Therapy Prior Inpatient Therapy: Yes Prior Therapy Dates: 09/12 Prior Therapy Facilty/Provider(s): [facility] Reason for Treatment: detox  Prior Outpatient Therapy Prior Outpatient Therapy: Yes Prior Therapy Dates: for over 20 years off and on Prior Therapy  Facilty/Provider(s): in [place]  Reason for Treatment: substance abuse            Values / Beliefs Cultural Requests During Hospitalization: None Spiritual Requests During Hospitalization: None        Additional Information 1:1 In Past 12 Months?: No CIRT Risk: No Elopement Risk: No Does patient have medical clearance?: Yes     Disposition:  Disposition Disposition of Patient: Referred to (Referral made to [facility]) Type of inpatient treatment program: Adult Patient referred to:  Yvet[facility] Coder)  On Site Evaluation by:   Reviewed with Physician:     Beatriz Stallion Ray 11/26/2011 12:40 AM

## 2011-11-26 NOTE — ED Notes (Signed)
Dr. Lynelle Doctor made aware of pt's placement status at [facility] (pt to report to [facility]).  Accepting MD is Dr. Danne Harbor.  Pt's spouse called (left voice message) informing him of pt's acceptance at [facility].

## 2011-12-03 ENCOUNTER — Other Ambulatory Visit (HOSPITAL_COMMUNITY): Payer: Self-pay | Admitting: Family Medicine

## 2011-12-03 DIAGNOSIS — Z8673 Personal history of transient ischemic attack (TIA), and cerebral infarction without residual deficits: Secondary | ICD-10-CM

## 2011-12-03 LAB — DRUG SCREEN PANEL (SERUM)

## 2011-12-08 ENCOUNTER — Ambulatory Visit (HOSPITAL_COMMUNITY): Payer: 59

## 2011-12-10 ENCOUNTER — Other Ambulatory Visit (HOSPITAL_COMMUNITY): Payer: Self-pay | Admitting: Family Medicine

## 2011-12-10 ENCOUNTER — Ambulatory Visit (HOSPITAL_COMMUNITY)
Admission: RE | Admit: 2011-12-10 | Discharge: 2011-12-10 | Disposition: A | Discharge status: Home / Self Care | Payer: 59 | Source: Ambulatory Visit | Attending: Family Medicine | Admitting: Family Medicine

## 2011-12-10 ENCOUNTER — Other Ambulatory Visit: Payer: Self-pay | Admitting: Urology

## 2011-12-10 DIAGNOSIS — M6281 Muscle weakness (generalized): Secondary | ICD-10-CM | POA: Insufficient documentation

## 2011-12-10 DIAGNOSIS — G458 Other transient cerebral ischemic attacks and related syndromes: Secondary | ICD-10-CM | POA: Insufficient documentation

## 2011-12-10 DIAGNOSIS — R209 Unspecified disturbances of skin sensation: Secondary | ICD-10-CM | POA: Insufficient documentation

## 2011-12-10 DIAGNOSIS — Z8673 Personal history of transient ischemic attack (TIA), and cerebral infarction without residual deficits: Secondary | ICD-10-CM

## 2011-12-10 DIAGNOSIS — R9389 Abnormal findings on diagnostic imaging of other specified body structures: Secondary | ICD-10-CM | POA: Insufficient documentation

## 2011-12-22 ENCOUNTER — Other Ambulatory Visit (HOSPITAL_COMMUNITY): Payer: Self-pay | Admitting: Internal Medicine

## 2011-12-22 DIAGNOSIS — I6529 Occlusion and stenosis of unspecified carotid artery: Secondary | ICD-10-CM

## 2011-12-28 ENCOUNTER — Ambulatory Visit (HOSPITAL_COMMUNITY)
Admission: RE | Admit: 2011-12-28 | Discharge: 2011-12-28 | Disposition: A | Discharge status: Home / Self Care | Payer: 59 | Source: Ambulatory Visit | Attending: Internal Medicine | Admitting: Internal Medicine

## 2011-12-28 ENCOUNTER — Other Ambulatory Visit (HOSPITAL_COMMUNITY): Payer: Self-pay | Admitting: Interventional Radiology

## 2011-12-28 DIAGNOSIS — I6529 Occlusion and stenosis of unspecified carotid artery: Secondary | ICD-10-CM

## 2011-12-28 DIAGNOSIS — I771 Stricture of artery: Secondary | ICD-10-CM

## 2011-12-30 ENCOUNTER — Other Ambulatory Visit: Payer: Self-pay | Admitting: Radiology

## 2011-12-31 ENCOUNTER — Other Ambulatory Visit: Payer: Self-pay | Admitting: Radiology

## 2012-01-03 ENCOUNTER — Other Ambulatory Visit (HOSPITAL_COMMUNITY): Payer: Self-pay | Admitting: Interventional Radiology

## 2012-01-03 ENCOUNTER — Ambulatory Visit (HOSPITAL_COMMUNITY)
Admission: RE | Admit: 2012-01-03 | Discharge: 2012-01-03 | Disposition: A | Discharge status: Home / Self Care | Payer: 59 | Source: Ambulatory Visit | Attending: Interventional Radiology | Admitting: Interventional Radiology

## 2012-01-03 ENCOUNTER — Encounter (HOSPITAL_COMMUNITY): Payer: Self-pay

## 2012-01-03 DIAGNOSIS — I771 Stricture of artery: Secondary | ICD-10-CM

## 2012-01-03 DIAGNOSIS — I658 Occlusion and stenosis of other precerebral arteries: Secondary | ICD-10-CM | POA: Insufficient documentation

## 2012-01-03 DIAGNOSIS — E119 Type 2 diabetes mellitus without complications: Secondary | ICD-10-CM | POA: Insufficient documentation

## 2012-01-03 DIAGNOSIS — I6509 Occlusion and stenosis of unspecified vertebral artery: Secondary | ICD-10-CM | POA: Insufficient documentation

## 2012-01-03 DIAGNOSIS — I651 Occlusion and stenosis of basilar artery: Secondary | ICD-10-CM | POA: Insufficient documentation

## 2012-01-03 DIAGNOSIS — I10 Essential (primary) hypertension: Secondary | ICD-10-CM | POA: Insufficient documentation

## 2012-01-03 LAB — DIFFERENTIAL
Basophils Absolute: 0 10*3/uL (ref 0.0–0.1)
Basophils Relative: 0 % (ref 0–1)
Eosinophils Absolute: 0.1 10*3/uL (ref 0.0–0.7)
Eosinophils Relative: 2 % (ref 0–5)
Lymphocytes Relative: 28 % (ref 12–46)
Lymphs Abs: 2.3 10*3/uL (ref 0.7–4.0)
Monocytes Absolute: 0.7 10*3/uL (ref 0.1–1.0)
Monocytes Relative: 9 % (ref 3–12)
Neutro Abs: 4.8 10*3/uL (ref 1.7–7.7)
Neutrophils Relative %: 60 % (ref 43–77)

## 2012-01-03 LAB — BASIC METABOLIC PANEL
BUN: 29 mg/dL — ABNORMAL HIGH (ref 6–23)
CO2: 25 mEq/L (ref 19–32)
Calcium: 9.4 mg/dL (ref 8.4–10.5)
Chloride: 99 mEq/L (ref 96–112)
Creatinine, Ser: 0.71 mg/dL (ref 0.50–1.10)
GFR calc Af Amer: >90 mL/min (ref >90)
GFR calc non Af Amer: 90 mL/min — ABNORMAL LOW (ref >90)
Glucose, Bld: 127 mg/dL — ABNORMAL HIGH (ref 70–99)
Potassium: 4.2 mEq/L (ref 3.5–5.1)
Sodium: 135 mEq/L (ref 135–145)

## 2012-01-03 LAB — GLUCOSE, CAPILLARY
Glucose-Capillary: 132 mg/dL — ABNORMAL HIGH (ref 70–99)
Glucose-Capillary: 166 mg/dL — ABNORMAL HIGH (ref 70–99)

## 2012-01-03 LAB — CBC
HCT: 33.2 % — ABNORMAL LOW (ref 36.0–46.0)
Hemoglobin: 11 g/dL — ABNORMAL LOW (ref 12.0–15.0)
MCH: 31.5 pg (ref 26.0–34.0)
MCHC: 33.1 g/dL (ref 30.0–36.0)
MCV: 95.1 fL (ref 78.0–100.0)
Platelets: 290 10*3/uL (ref 150–400)
RBC: 3.49 MIL/uL — ABNORMAL LOW (ref 3.87–5.11)
RDW: 14.6 % (ref 11.5–15.5)
WBC: 8 10*3/uL (ref 4.0–10.5)

## 2012-01-03 LAB — PROTIME-INR
INR: 0.98 (ref 0.00–1.49)
Prothrombin Time: 13.2 seconds (ref 11.6–15.2)

## 2012-01-03 LAB — APTT: aPTT: 33 seconds (ref 24–37)

## 2012-01-03 MED ORDER — SODIUM CHLORIDE 0.9 % IV SOLN: INTRAVENOUS | Status: AC

## 2012-01-03 MED ORDER — METHYLPREDNISOLONE SODIUM SUCC 125 MG IJ SOLR
INTRAMUSCULAR | Status: AC
  Filled 2012-01-03: qty 2

## 2012-01-03 MED ORDER — METHYLPREDNISOLONE SODIUM SUCC 125 MG IJ SOLR
125.0000 mg | Freq: Once | INTRAMUSCULAR | Status: AC
  Administered 2012-01-03: 125 mg via INTRAVENOUS

## 2012-01-03 MED ORDER — MIDAZOLAM HCL 2 MG/2ML IJ SOLN
INTRAMUSCULAR | Status: AC
  Filled 2012-01-03: qty 6

## 2012-01-03 MED ORDER — METFORMIN HCL ER 750 MG PO TB24: 750.0000 mg | ORAL_TABLET | Freq: Two times a day (BID) | ORAL | Status: DC

## 2012-01-03 MED ORDER — HEPARIN SOD (PORK) LOCK FLUSH 100 UNIT/ML IV SOLN
500.0000 [IU] | Freq: Once | INTRAVENOUS | Status: AC
  Administered 2012-01-03: 500 [IU] via INTRAVENOUS

## 2012-01-03 MED ORDER — FENTANYL CITRATE 0.05 MG/ML IJ SOLN
INTRAMUSCULAR | Status: AC
  Filled 2012-01-03: qty 4

## 2012-01-03 MED ORDER — SODIUM CHLORIDE 0.9 % IV SOLN
Freq: Once | INTRAVENOUS | Status: AC
  Administered 2012-01-03: 75 mL/h via INTRAVENOUS

## 2012-01-03 MED ORDER — MIDAZOLAM HCL 5 MG/5ML IJ SOLN
INTRAMUSCULAR | Status: AC | PRN
  Administered 2012-01-03 (×2): 1 mg via INTRAVENOUS

## 2012-01-03 MED ORDER — DIPHENHYDRAMINE HCL 50 MG/ML IJ SOLN: 50.0000 mg | Freq: Once | INTRAMUSCULAR | Status: DC

## 2012-01-03 MED ORDER — FENTANYL CITRATE 0.05 MG/ML IJ SOLN
INTRAMUSCULAR | Status: AC | PRN
  Administered 2012-01-03 (×3): 25 ug via INTRAVENOUS

## 2012-01-03 MED ORDER — IOHEXOL 300 MG/ML  SOLN
70.0000 mL | Freq: Once | INTRAMUSCULAR | Status: AC | PRN
  Administered 2012-01-03: 70 mL via INTRAVENOUS

## 2012-01-03 MED ORDER — DIPHENHYDRAMINE HCL 50 MG/ML IJ SOLN
INTRAMUSCULAR | Status: AC
  Administered 2012-01-03: 50 mg via INTRAVENOUS
  Filled 2012-01-03: qty 1

## 2012-01-03 NOTE — Procedures (Signed)
S/P 4 vessel cerebral arteriogram. RT CFA approach  Preliminary findings.. 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

## 2012-01-03 NOTE — H&P (Signed)
[name] is an 66 y.o. female.   Chief Complaint: Left sided facial numbness and left extremity weakness; resolved. Abnormal MRI/MRA: Basilar artery stenosis HPI: scheduled today for cerebral arteriogram  Past Medical History  Diagnosis Date  . Alcohol abuse   . Diabetes mellitus   . Hypertension     Past Surgical History  Procedure Date  . Cesarean section   . Tonsillectomy   . Bunionectomy     No family history on file. Social History:  reports that she has been smoking.  She does not have any smokeless tobacco history on file. She reports that she drinks alcohol. She reports that she does not use illicit drugs.  Allergies:  Allergies  Allergen Reactions  . Other Anaphylaxis    shellfish  . Latex Other (See Comments)    Break outs  . Pentazocine Lactate   . Codeine Rash    And nausea   . Sulfonamide Derivatives Rash    Medications Prior to Admission  Medication Sig Dispense Refill  . aspirin 81 MG chewable tablet Chew 1 tablet (81 mg total) by mouth daily.  30 tablet  0  . diphenoxylate-atropine (LOMOTIL) 2.5-0.025 MG per tablet Take 2 tablets by mouth 2 (two) times daily as needed. diarrhea       . ezetimibe (ZETIA) 10 MG tablet Take 10 mg by mouth daily.        . folic acid (FOLVITE) 1 MG tablet Take 1 mg by mouth daily.        Marland Kitchen HYDROcodone-acetaminophen (NORCO) 5-325 MG per tablet Take 1 tablet by mouth every 6 (six) hours as needed. For pain      . lisinopril (PRINIVIL,ZESTRIL) 5 MG tablet Take 5 mg by mouth daily.        . Magnesium Hydroxide (MAGNESIA PO) Take 1 tablet by mouth daily.        . metFORMIN (GLUCOPHAGE-XR) 750 MG 24 hr tablet Take 750 mg by mouth 2 (two) times daily.       . nebivolol (BYSTOLIC) 5 MG tablet Take 5 mg by mouth daily.        . Omega-3 Fatty Acids (FISH OIL) 1000 MG CPDR Take 4 capsules by mouth at bedtime. For a total of 4000mg  daily       . sitaGLIPtin (JANUVIA) 100 MG tablet Take 100 mg by mouth daily.         Medications  Prior to Admission  Medication Dose Route Frequency Provider Last Rate Last Dose  . 0.9 %  sodium chloride infusion   Intravenous Once Robet Leu, PA        Results for orders placed during the hospital encounter of 01/03/12 (from the past 48 hour(s))  GLUCOSE, CAPILLARY     Status: Abnormal   Collection Time   01/03/12  7:22 AM      Component Value Range Comment   Glucose-Capillary 132 (*) 70 - 99 (mg/dL)    No results found.  Review of Systems  Constitutional: Negative for fever and chills.  Eyes: Negative for blurred vision.  Respiratory: Negative for cough.   Cardiovascular: Negative for chest pain.  Gastrointestinal: Negative for nausea, vomiting and abdominal pain.  Neurological: Negative for headaches.    Blood pressure 137/80, pulse 101, temperature 99 F (37.2 C), temperature source Oral, resp. rate 20, height 5\' 4"  (1.626 m), weight 150 lb (68.04 kg), SpO2 96.00%. Physical Exam  Constitutional: She is oriented to person, place, and time. She appears well-developed and  well-nourished.  HENT:  Head: Normocephalic.  Eyes: EOM are normal.  Neck: Normal range of motion.  Cardiovascular: Normal rate, regular rhythm and normal heart sounds.   No murmur heard. Respiratory: Effort normal and breath sounds normal. She has no wheezes.  GI: Soft. Bowel sounds are normal. There is no tenderness.  Musculoskeletal: Normal range of motion.  Neurological: She is alert and oriented to person, place, and time.  Skin: Skin is warm and dry.     Assessment/Plan Basilar artery stenosis resulting in Left facial numbness and left extr weakness: resolved. Scheduled now for Cerebral Arteriogram. Pt aware of procedure benefits and risks and agreeable to proceed. Consent signed. Remote hx of shellfish allergy : anaphylaxis Will premedicate with Solumedrol/benadryl IV per Dr Bradly Chris A 01/03/2012, 7:31 AM

## 2012-03-11 ENCOUNTER — Encounter (HOSPITAL_COMMUNITY): Payer: Self-pay | Admitting: *Deleted

## 2012-03-11 ENCOUNTER — Emergency Department (HOSPITAL_COMMUNITY)
Admission: EM | Admit: 2012-03-11 | Discharge: 2012-03-12 | Disposition: A | Discharge status: Other Acute Inpatient Hospital | Payer: 59 | Attending: Emergency Medicine | Admitting: Emergency Medicine

## 2012-03-11 DIAGNOSIS — Z79899 Other long term (current) drug therapy: Secondary | ICD-10-CM | POA: Insufficient documentation

## 2012-03-11 DIAGNOSIS — E119 Type 2 diabetes mellitus without complications: Secondary | ICD-10-CM | POA: Insufficient documentation

## 2012-03-11 DIAGNOSIS — F1010 Alcohol abuse, uncomplicated: Secondary | ICD-10-CM

## 2012-03-11 DIAGNOSIS — I10 Essential (primary) hypertension: Secondary | ICD-10-CM | POA: Insufficient documentation

## 2012-03-11 LAB — DIFFERENTIAL
Basophils Absolute: 0.1 10*3/uL (ref 0.0–0.1)
Basophils Relative: 1 % (ref 0–1)
Eosinophils Absolute: 0 10*3/uL (ref 0.0–0.7)
Eosinophils Relative: 0 % (ref 0–5)
Lymphocytes Relative: 49 % — ABNORMAL HIGH (ref 12–46)
Lymphs Abs: 3.7 10*3/uL (ref 0.7–4.0)
Monocytes Absolute: 0.4 10*3/uL (ref 0.1–1.0)
Monocytes Relative: 5 % (ref 3–12)
Neutro Abs: 3.3 10*3/uL (ref 1.7–7.7)
Neutrophils Relative %: 44 % (ref 43–77)

## 2012-03-11 LAB — RAPID URINE DRUG SCREEN, HOSP PERFORMED
Amphetamines: NOT DETECTED
Barbiturates: NOT DETECTED
Benzodiazepines: NOT DETECTED
Cocaine: NOT DETECTED
Opiates: NOT DETECTED
Tetrahydrocannabinol: NOT DETECTED

## 2012-03-11 LAB — CBC
HCT: 43.5 % (ref 36.0–46.0)
Hemoglobin: 14.9 g/dL (ref 12.0–15.0)
MCH: 31.8 pg (ref 26.0–34.0)
MCHC: 34.3 g/dL (ref 30.0–36.0)
MCV: 92.9 fL (ref 78.0–100.0)
Platelets: 315 10*3/uL (ref 150–400)
RBC: 4.68 MIL/uL (ref 3.87–5.11)
RDW: 16.1 % — ABNORMAL HIGH (ref 11.5–15.5)
WBC: 7.5 10*3/uL (ref 4.0–10.5)

## 2012-03-11 LAB — COMPREHENSIVE METABOLIC PANEL
ALT: 16 U/L (ref 0–35)
AST: 23 U/L (ref 0–37)
Albumin: 4.3 g/dL (ref 3.5–5.2)
Alkaline Phosphatase: 94 U/L (ref 39–117)
BUN: 13 mg/dL (ref 6–23)
CO2: 21 mEq/L (ref 19–32)
Calcium: 9 mg/dL (ref 8.4–10.5)
Chloride: 91 mEq/L — ABNORMAL LOW (ref 96–112)
Creatinine, Ser: 0.57 mg/dL (ref 0.50–1.10)
GFR calc Af Amer: >90 mL/min (ref >90)
GFR calc non Af Amer: >90 mL/min (ref >90)
Glucose, Bld: 191 mg/dL — ABNORMAL HIGH (ref 70–99)
Potassium: 4 mEq/L (ref 3.5–5.1)
Sodium: 134 mEq/L — ABNORMAL LOW (ref 135–145)
Total Bilirubin: 0.3 mg/dL (ref 0.3–1.2)
Total Protein: 8.4 g/dL — ABNORMAL HIGH (ref 6.0–8.3)

## 2012-03-11 LAB — ETHANOL: Alcohol, Ethyl (B): 331 mg/dL — ABNORMAL HIGH (ref 0–11)

## 2012-03-11 LAB — GLUCOSE, CAPILLARY: Glucose-Capillary: 192 mg/dL — ABNORMAL HIGH (ref 70–99)

## 2012-03-11 LAB — ACETAMINOPHEN LEVEL: Acetaminophen (Tylenol), Serum: <15 ug/mL (ref 10–30)

## 2012-03-11 MED ORDER — LISINOPRIL 5 MG PO TABS
5.0000 mg | ORAL_TABLET | Freq: Every day | ORAL | Status: DC
  Administered 2012-03-11 – 2012-03-12 (×2): 5 mg via ORAL
  Filled 2012-03-11 (×2): qty 1

## 2012-03-11 MED ORDER — LORAZEPAM 2 MG/ML IJ SOLN: 1.0000 mg | Freq: Four times a day (QID) | INTRAMUSCULAR | Status: DC | PRN

## 2012-03-11 MED ORDER — VITAMIN B-1 100 MG PO TABS
100.0000 mg | ORAL_TABLET | Freq: Every day | ORAL | Status: DC
  Administered 2012-03-11 – 2012-03-12 (×2): 100 mg via ORAL
  Filled 2012-03-11 (×2): qty 1

## 2012-03-11 MED ORDER — LORAZEPAM 1 MG PO TABS
0.0000 mg | ORAL_TABLET | Freq: Four times a day (QID) | ORAL | Status: DC
  Administered 2012-03-11: 1 mg via ORAL
  Administered 2012-03-11: 2 mg via ORAL
  Administered 2012-03-12: 1 mg via ORAL
  Administered 2012-03-12: 2 mg via ORAL
  Filled 2012-03-11 (×2): qty 2
  Filled 2012-03-11: qty 1

## 2012-03-11 MED ORDER — THIAMINE HCL 100 MG/ML IJ SOLN: 100.0000 mg | Freq: Every day | INTRAMUSCULAR | Status: DC

## 2012-03-11 MED ORDER — ZOLPIDEM TARTRATE 5 MG PO TABS: 5.0000 mg | ORAL_TABLET | Freq: Every evening | ORAL | Status: DC | PRN

## 2012-03-11 MED ORDER — ADULT MULTIVITAMIN W/MINERALS CH
1.0000 | ORAL_TABLET | Freq: Every day | ORAL | Status: DC
  Administered 2012-03-11 – 2012-03-12 (×3): 1 via ORAL
  Filled 2012-03-11 (×3): qty 1

## 2012-03-11 MED ORDER — LORAZEPAM 1 MG PO TABS
1.0000 mg | ORAL_TABLET | Freq: Once | ORAL | Status: AC
  Administered 2012-03-11: 1 mg via ORAL
  Filled 2012-03-11: qty 1

## 2012-03-11 MED ORDER — ACETAMINOPHEN 325 MG PO TABS: 650.0000 mg | ORAL_TABLET | ORAL | Status: DC | PRN

## 2012-03-11 MED ORDER — METFORMIN HCL ER 750 MG PO TB24
750.0000 mg | ORAL_TABLET | Freq: Every day | ORAL | Status: DC
  Administered 2012-03-12: 750 mg via ORAL
  Filled 2012-03-11: qty 1

## 2012-03-11 MED ORDER — LORAZEPAM 1 MG PO TABS: 0.0000 mg | ORAL_TABLET | Freq: Two times a day (BID) | ORAL | Status: DC

## 2012-03-11 MED ORDER — ONDANSETRON HCL 4 MG PO TABS
4.0000 mg | ORAL_TABLET | Freq: Three times a day (TID) | ORAL | Status: DC | PRN
  Administered 2012-03-11 (×2): 4 mg via ORAL
  Filled 2012-03-11 (×2): qty 1

## 2012-03-11 MED ORDER — LORAZEPAM 1 MG PO TABS: 1.0000 mg | ORAL_TABLET | Freq: Three times a day (TID) | ORAL | Status: DC | PRN

## 2012-03-11 MED ORDER — LINAGLIPTIN 5 MG PO TABS
5.0000 mg | ORAL_TABLET | Freq: Every day | ORAL | Status: DC
  Administered 2012-03-11 – 2012-03-12 (×2): 5 mg via ORAL
  Filled 2012-03-11 (×2): qty 1

## 2012-03-11 MED ORDER — FOLIC ACID 1 MG PO TABS
1.0000 mg | ORAL_TABLET | Freq: Every day | ORAL | Status: DC
  Administered 2012-03-11 – 2012-03-12 (×2): 1 mg via ORAL
  Filled 2012-03-11 (×2): qty 1

## 2012-03-11 MED ORDER — NEBIVOLOL HCL 5 MG PO TABS
5.0000 mg | ORAL_TABLET | Freq: Every day | ORAL | Status: DC
  Administered 2012-03-11 – 2012-03-12 (×2): 5 mg via ORAL
  Filled 2012-03-11 (×2): qty 1

## 2012-03-11 MED ORDER — DIPHENOXYLATE-ATROPINE 2.5-0.025 MG PO TABS
1.0000 | ORAL_TABLET | Freq: Four times a day (QID) | ORAL | Status: DC | PRN
  Administered 2012-03-11 – 2012-03-12 (×2): 1 via ORAL
  Filled 2012-03-11 (×2): qty 1

## 2012-03-11 MED ORDER — LORAZEPAM 1 MG PO TABS
1.0000 mg | ORAL_TABLET | Freq: Four times a day (QID) | ORAL | Status: DC | PRN
  Administered 2012-03-11: 1 mg via ORAL
  Filled 2012-03-11 (×2): qty 1

## 2012-03-11 NOTE — ED Notes (Signed)
Attempted to call report to Roanna Epley, RN, unable to take report, to call this nurse back.

## 2012-03-11 NOTE — ED Notes (Signed)
Pt vomiting--Dr Deretha Emory aware-repeat Zofran

## 2012-03-11 NOTE — ED Notes (Addendum)
Pt from home requesting detox from alcohol, pt seeking rehab at [facility] in [place] and was told that beds were available after medical clearance. Pt denies SI/HI. Pt reports last drink was a small bottle of wine 1 1/2 hour ago.

## 2012-03-11 NOTE — ED Notes (Signed)
Pt changed in to blue scrubs, pt and pt's personal belongings (one bag) wanded by [employer].

## 2012-03-11 NOTE — ED Notes (Signed)
Paige ACT into see

## 2012-03-11 NOTE — ED Provider Notes (Signed)
History     CSN: [id number]  Arrival date & time 03/11/12  4098   First MD Initiated Contact with Patient 03/11/12 1005      Chief Complaint  Patient presents with  . V70.1    (Consider location/radiation/quality/duration/timing/severity/associated sxs/prior treatment) The history is provided by the patient.   66 year old female presents for alcohol detox states last drink she had was 8:00 this morning she was trying to detox herself the last few days not successful so started drinking again also has been taking Ativan at home. Patient without any specific complaints she is currently alert and able to answer questions and very cooperative. Past medical history sniffing for hypertension and diabetes.  Past Medical History  Diagnosis Date  . Alcohol abuse   . Diabetes mellitus   . Hypertension     Past Surgical History  Procedure Date  . Cesarean section   . Tonsillectomy   . Bunionectomy     History reviewed. No pertinent family history.  History  Substance Use Topics  . Smoking status: Former Smoker    Quit date: 03/11/2010  . Smokeless tobacco: Never Used  . Alcohol Use: Yes     4-5 bottles of wine per day    OB History    Grav Para Term Preterm Abortions TAB SAB Ect Mult Living                  Review of Systems  Constitutional: Negative for fever, chills and diaphoresis.  HENT: Negative for congestion, neck pain and neck stiffness.   Eyes: Negative for visual disturbance.  Respiratory: Negative for cough and shortness of breath.   Cardiovascular: Negative for chest pain.  Gastrointestinal: Negative for nausea, vomiting and abdominal pain.  Genitourinary: Negative for dysuria.  Musculoskeletal: Negative for back pain.  Skin: Negative for rash.  Neurological: Negative for tremors and headaches.  Hematological: Does not bruise/bleed easily.    Allergies  Other; Latex; Pentazocine lactate; Codeine; and Sulfonamide derivatives  Home Medications    Current Outpatient Rx  Name Route Sig Dispense Refill  . ASPIRIN 81 MG PO CHEW Oral Chew 1 tablet (81 mg total) by mouth daily. 30 tablet 0  . CITALOPRAM HYDROBROMIDE 10 MG PO TABS Oral Take 10 mg by mouth daily.    Marland Kitchen DIPHENOXYLATE-ATROPINE 2.5-0.025 MG PO TABS Oral Take 2 tablets by mouth 2 (two) times daily as needed. diarrhea     . ESZOPICLONE 3 MG PO TABS Oral Take 3 mg by mouth at bedtime as needed. For sleep.    Marland Kitchen EZETIMIBE 10 MG PO TABS Oral Take 10 mg by mouth daily.      Marland Kitchen FOLIC ACID 1 MG PO TABS Oral Take 1 mg by mouth daily.      Marland Kitchen HYDROCODONE-ACETAMINOPHEN 5-325 MG PO TABS Oral Take 1 tablet by mouth every 4 (four) hours as needed. For pain    . LISINOPRIL 5 MG PO TABS Oral Take 5 mg by mouth daily.      Marland Kitchen LORAZEPAM 1 MG PO TABS Oral Take 1 mg by mouth every 8 (eight) hours as needed. For anxiety.    . METFORMIN HCL ER 750 MG PO TB24 Oral Take 1 tablet (750 mg total) by mouth 2 (two) times daily. 750 tablet 3    Resume on WEDNESDAY  . NEBIVOLOL HCL 5 MG PO TABS Oral Take 5 mg by mouth daily.      Marland Kitchen FISH OIL 1000 MG PO CPDR Oral Take 4 capsules by mouth  at bedtime. For a total of 4000mg  daily     . OVER THE COUNTER MEDICATION Oral Take 1 capsule by mouth daily. Co q 10 200mg     . SITAGLIPTIN PHOSPHATE 100 MG PO TABS Oral Take 100 mg by mouth daily.        BP 163/83  Pulse 121  Temp(Src) 99 F (37.2 C) (Oral)  Resp 22  SpO2 96%  Physical Exam  Nursing note and vitals reviewed. Constitutional: She is oriented to person, place, and time. She appears well-developed and well-nourished.  HENT:  Head: Normocephalic and atraumatic.  Mouth/Throat: Oropharynx is clear and moist.  Eyes: Conjunctivae are normal. Pupils are equal, round, and reactive to light.  Neck: Normal range of motion. Neck supple.  Cardiovascular: Normal rate, regular rhythm and normal heart sounds.   No murmur heard. Pulmonary/Chest: Effort normal and breath sounds normal.  Abdominal: Soft. Bowel sounds  are normal. There is no tenderness.  Musculoskeletal: Normal range of motion. She exhibits no edema and no tenderness.  Neurological: She is alert and oriented to person, place, and time. No cranial nerve deficit. She exhibits normal muscle tone. Coordination normal.  Skin: Skin is warm. No rash noted. She is not diaphoretic.    ED Course  Procedures (including critical care time)  Labs Reviewed  CBC - Abnormal; Notable for the following:    RDW 16.1 (*)    All other components within normal limits  DIFFERENTIAL - Abnormal; Notable for the following:    Lymphocytes Relative 49 (*)    All other components within normal limits  COMPREHENSIVE METABOLIC PANEL - Abnormal; Notable for the following:    Sodium 134 (*)    Chloride 91 (*)    Glucose, Bld 191 (*)    Total Protein 8.4 (*)    All other components within normal limits  ETHANOL - Abnormal; Notable for the following:    Alcohol, Ethyl (B) 331 (*)    All other components within normal limits  ACETAMINOPHEN LEVEL  URINE RAPID DRUG SCREEN (HOSP PERFORMED)    Results for orders placed during the hospital encounter of 03/11/12  CBC      Component Value Range   WBC 7.5  4.0 - 10.5 (K/uL)   RBC 4.68  3.87 - 5.11 (MIL/uL)   Hemoglobin 14.9  12.0 - 15.0 (g/dL)   HCT 19.1  47.8 - 29.5 (%)   MCV 92.9  78.0 - 100.0 (fL)   MCH 31.8  26.0 - 34.0 (pg)   MCHC 34.3  30.0 - 36.0 (g/dL)   RDW 62.1 (*) 30.8 - 15.5 (%)   Platelets 315  150 - 400 (K/uL)  DIFFERENTIAL      Component Value Range   Neutrophils Relative 44  43 - 77 (%)   Neutro Abs 3.3  1.7 - 7.7 (K/uL)   Lymphocytes Relative 49 (*) 12 - 46 (%)   Lymphs Abs 3.7  0.7 - 4.0 (K/uL)   Monocytes Relative 5  3 - 12 (%)   Monocytes Absolute 0.4  0.1 - 1.0 (K/uL)   Eosinophils Relative 0  0 - 5 (%)   Eosinophils Absolute 0.0  0.0 - 0.7 (K/uL)   Basophils Relative 1  0 - 1 (%)   Basophils Absolute 0.1  0.0 - 0.1 (K/uL)  COMPREHENSIVE METABOLIC PANEL      Component Value Range    Sodium 134 (*) 135 - 145 (mEq/L)   Potassium 4.0  3.5 - 5.1 (mEq/L)   Chloride 91 (*)  96 - 112 (mEq/L)   CO2 21  19 - 32 (mEq/L)   Glucose, Bld 191 (*) 70 - 99 (mg/dL)   BUN 13  6 - 23 (mg/dL)   Creatinine, Ser 1.61  0.50 - 1.10 (mg/dL)   Calcium 9.0  8.4 - 09.6 (mg/dL)   Total Protein 8.4 (*) 6.0 - 8.3 (g/dL)   Albumin 4.3  3.5 - 5.2 (g/dL)   AST 23  0 - 37 (U/L)   ALT 16  0 - 35 (U/L)   Alkaline Phosphatase 94  39 - 117 (U/L)   Total Bilirubin 0.3  0.3 - 1.2 (mg/dL)   GFR calc non Af Amer >90  >90 (mL/min)   GFR calc Af Amer >90  >90 (mL/min)  ETHANOL      Component Value Range   Alcohol, Ethyl (B) 331 (*) 0 - 11 (mg/dL)  ACETAMINOPHEN LEVEL      Component Value Range   Acetaminophen (Tylenol), Serum <15.0  10 - 30 (ug/mL)    No results found.   1. Alcohol abuse       MDM  Patient with significant elevation of blood alcohol level however however very functional patient presents for detox already feeling like she's going through some withdrawal. The patient claims she been throughout all detox approximately one year ago. Past medical history in ED visit shows that she's had trouble with alcohol abuse frequently since the fall. Patient when medically cleared for evaluation by behavioral health team for detox center.        Shelda Jakes, MD 03/11/12 [phone]

## 2012-03-11 NOTE — ED Notes (Signed)
Still w/ some nausea, but was able to drink broth and eat a small side salad

## 2012-03-11 NOTE — ED Notes (Signed)
Nausea relieved  
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
## 2012-03-11 NOTE — ED Notes (Addendum)
Nausea improved, pt more  Restless and  sweaty, agitation has improved, but tremors have increased-more movement tremors present.

## 2012-03-11 NOTE — ED Notes (Signed)
Dr Deretha Emory aware of elevated pulse-no additional orders at this time

## 2012-03-11 NOTE — ED Notes (Signed)
MD at bedside. 

## 2012-03-11 NOTE — ED Notes (Signed)
Up to the desk on the phone 

## 2012-03-11 NOTE — ED Notes (Signed)
Up to the bathroom 

## 2012-03-11 NOTE — ED Notes (Signed)
Belongings locked in [place] # 40 in TCU per NT.

## 2012-03-12 ENCOUNTER — Inpatient Hospital Stay (HOSPITAL_COMMUNITY)
Admission: AD | Admit: 2012-03-12 | Discharge: 2012-03-14 | DRG: 897 | Disposition: A | Discharge status: Home / Self Care | Payer: 59 | Source: Other Acute Inpatient Hospital | Attending: Psychiatry | Admitting: Psychiatry

## 2012-03-12 ENCOUNTER — Encounter (HOSPITAL_COMMUNITY): Payer: Self-pay | Admitting: *Deleted

## 2012-03-12 DIAGNOSIS — F1010 Alcohol abuse, uncomplicated: Secondary | ICD-10-CM

## 2012-03-12 DIAGNOSIS — Z91013 Allergy to seafood: Secondary | ICD-10-CM

## 2012-03-12 DIAGNOSIS — E65 Localized adiposity: Secondary | ICD-10-CM

## 2012-03-12 DIAGNOSIS — F329 Major depressive disorder, single episode, unspecified: Secondary | ICD-10-CM

## 2012-03-12 DIAGNOSIS — E119 Type 2 diabetes mellitus without complications: Secondary | ICD-10-CM

## 2012-03-12 DIAGNOSIS — J309 Allergic rhinitis, unspecified: Secondary | ICD-10-CM

## 2012-03-12 DIAGNOSIS — F10239 Alcohol dependence with withdrawal, unspecified: Secondary | ICD-10-CM

## 2012-03-12 DIAGNOSIS — I10 Essential (primary) hypertension: Secondary | ICD-10-CM

## 2012-03-12 DIAGNOSIS — Z87892 Personal history of anaphylaxis: Secondary | ICD-10-CM

## 2012-03-12 DIAGNOSIS — F1020 Alcohol dependence, uncomplicated: Principal | ICD-10-CM

## 2012-03-12 DIAGNOSIS — E785 Hyperlipidemia, unspecified: Secondary | ICD-10-CM

## 2012-03-12 DIAGNOSIS — F322 Major depressive disorder, single episode, severe without psychotic features: Secondary | ICD-10-CM

## 2012-03-12 DIAGNOSIS — Z79899 Other long term (current) drug therapy: Secondary | ICD-10-CM

## 2012-03-12 DIAGNOSIS — Z7982 Long term (current) use of aspirin: Secondary | ICD-10-CM

## 2012-03-12 DIAGNOSIS — Z882 Allergy status to sulfonamides status: Secondary | ICD-10-CM

## 2012-03-12 DIAGNOSIS — F3289 Other specified depressive episodes: Secondary | ICD-10-CM | POA: Insufficient documentation

## 2012-03-12 DIAGNOSIS — Z888 Allergy status to other drugs, medicaments and biological substances status: Secondary | ICD-10-CM

## 2012-03-12 DIAGNOSIS — F10939 Alcohol use, unspecified with withdrawal, unspecified: Secondary | ICD-10-CM

## 2012-03-12 DIAGNOSIS — F411 Generalized anxiety disorder: Secondary | ICD-10-CM | POA: Insufficient documentation

## 2012-03-12 LAB — GLUCOSE, CAPILLARY
Glucose-Capillary: 135 mg/dL — ABNORMAL HIGH (ref 70–99)
Glucose-Capillary: 133 mg/dL — ABNORMAL HIGH (ref 70–99)

## 2012-03-12 MED ORDER — METFORMIN HCL ER 750 MG PO TB24
750.0000 mg | ORAL_TABLET | Freq: Two times a day (BID) | ORAL | Status: DC
Start: 2012-03-12 — End: 2012-03-14
  Administered 2012-03-13 – 2012-03-14 (×3): 750 mg via ORAL
  Filled 2012-03-12 (×6): qty 1

## 2012-03-12 MED ORDER — LOPERAMIDE HCL 2 MG PO CAPS: 2.0000 mg | ORAL_CAPSULE | ORAL | Status: DC | PRN

## 2012-03-12 MED ORDER — ADULT MULTIVITAMIN W/MINERALS CH
1.0000 | ORAL_TABLET | Freq: Every day | ORAL | Status: DC
  Administered 2012-03-13 – 2012-03-14 (×2): 1 via ORAL
  Filled 2012-03-12 (×4): qty 1

## 2012-03-12 MED ORDER — CHLORDIAZEPOXIDE HCL 25 MG PO CAPS
25.0000 mg | ORAL_CAPSULE | Freq: Four times a day (QID) | ORAL | Status: AC
  Administered 2012-03-12 – 2012-03-13 (×6): 25 mg via ORAL
  Filled 2012-03-12 (×6): qty 1

## 2012-03-12 MED ORDER — CHLORDIAZEPOXIDE HCL 25 MG PO CAPS
25.0000 mg | ORAL_CAPSULE | Freq: Four times a day (QID) | ORAL | Status: DC | PRN
  Administered 2012-03-14: 25 mg via ORAL
  Filled 2012-03-12: qty 1

## 2012-03-12 MED ORDER — HYDROXYZINE HCL 25 MG PO TABS
25.0000 mg | ORAL_TABLET | Freq: Four times a day (QID) | ORAL | Status: DC | PRN
  Administered 2012-03-14: 25 mg via ORAL

## 2012-03-12 MED ORDER — MAGNESIUM HYDROXIDE 400 MG/5ML PO SUSP: 30.0000 mL | Freq: Every day | ORAL | Status: DC | PRN

## 2012-03-12 MED ORDER — CHLORDIAZEPOXIDE HCL 25 MG PO CAPS
25.0000 mg | ORAL_CAPSULE | Freq: Three times a day (TID) | ORAL | Status: DC
  Administered 2012-03-14 (×2): 25 mg via ORAL
  Filled 2012-03-12 (×2): qty 1

## 2012-03-12 MED ORDER — THIAMINE HCL 100 MG/ML IJ SOLN: 100.0000 mg | Freq: Once | INTRAMUSCULAR | Status: DC

## 2012-03-12 MED ORDER — ALUM & MAG HYDROXIDE-SIMETH 200-200-20 MG/5ML PO SUSP: 30.0000 mL | ORAL | Status: DC | PRN

## 2012-03-12 MED ORDER — CHLORDIAZEPOXIDE HCL 25 MG PO CAPS: 25.0000 mg | ORAL_CAPSULE | Freq: Every day | ORAL | Status: DC

## 2012-03-12 MED ORDER — ACETAMINOPHEN 325 MG PO TABS: 650.0000 mg | ORAL_TABLET | Freq: Four times a day (QID) | ORAL | Status: DC | PRN

## 2012-03-12 MED ORDER — CHLORDIAZEPOXIDE HCL 25 MG PO CAPS: 25.0000 mg | ORAL_CAPSULE | ORAL | Status: DC

## 2012-03-12 MED ORDER — DIPHENOXYLATE-ATROPINE 2.5-0.025 MG PO TABS
1.0000 | ORAL_TABLET | Freq: Four times a day (QID) | ORAL | Status: DC | PRN
  Administered 2012-03-12 – 2012-03-14 (×4): 1 via ORAL
  Filled 2012-03-12 (×4): qty 1

## 2012-03-12 MED ORDER — VITAMIN B-1 100 MG PO TABS
100.0000 mg | ORAL_TABLET | Freq: Every day | ORAL | Status: DC
  Administered 2012-03-13 – 2012-03-14 (×2): 100 mg via ORAL
  Filled 2012-03-12 (×3): qty 1

## 2012-03-12 MED ORDER — ONDANSETRON 4 MG PO TBDP: 4.0000 mg | ORAL_TABLET | Freq: Four times a day (QID) | ORAL | Status: DC | PRN

## 2012-03-12 MED ORDER — HYDROXYZINE HCL 50 MG PO TABS: 50.0000 mg | ORAL_TABLET | ORAL | Status: DC | PRN

## 2012-03-12 MED ORDER — LISINOPRIL 5 MG PO TABS
5.0000 mg | ORAL_TABLET | Freq: Every day | ORAL | Status: DC
  Administered 2012-03-13 – 2012-03-14 (×2): 5 mg via ORAL
  Filled 2012-03-12 (×4): qty 1

## 2012-03-12 NOTE — ED Provider Notes (Addendum)
Pt here for alcohol detox.  Accepted at [facility] for later today.    Nat Christen, MD 03/12/12 0759  Pt being transferred to [facility].    Nat Christen, MD 03/12/12 1004

## 2012-03-12 NOTE — BH Assessment (Signed)
Per [facility] pt accepted by Dan Humphreys to Bed 303.2. RN notified. Pt is go to over to [facility] at 9:45 am

## 2012-03-12 NOTE — ED Notes (Signed)
Husband at bedside, nad

## 2012-03-12 NOTE — ED Notes (Signed)
Up to the bathroom 

## 2012-03-12 NOTE — ED Notes (Signed)
Up to the bathroom to bath and change scrubs 

## 2012-03-12 NOTE — ED Notes (Signed)
Up to the bathroom w/ diarrhea-will medicate prior to transport

## 2012-03-12 NOTE — ED Notes (Signed)
Isaac Laud RN updated, pt will transport shortly

## 2012-03-12 NOTE — ED Notes (Signed)
Pt ambulatory to [facility] w/ security and NT.  Bags of belongings sent w/ pt, husband is going to follow pt there.

## 2012-03-12 NOTE — Progress Notes (Signed)
Pt has been up and has been active while in the milieu today, has had some complaints of withdrawal but has stated that she is feeling better today than yesterday, has complained of diarrhea, and having bad dreams, pt has received all medications without incident, will continue to monitor

## 2012-03-12 NOTE — ED Notes (Signed)
Up to the desk on the phone 

## 2012-03-12 NOTE — Tx Team (Signed)
Initial Interdisciplinary Treatment Plan  PATIENT STRENGTHS: (choose at least two) Ability for insight Average or above average intelligence Motivation for treatment/growth Supportive family/friends  PATIENT STRESSORS: Legal issue Substance abuse   PROBLEM LIST: Problem List/Patient Goals Date to be addressed Date deferred Reason deferred Estimated date of resolution  Substance abuse/dependence 03-12-2012     (etoh)                                                 DISCHARGE CRITERIA:  Improved stabilization in mood, thinking, and/or behavior Withdrawal symptoms are absent or subacute and managed without 24-hour nursing intervention  PRELIMINARY DISCHARGE PLAN: Attend aftercare/continuing care group Attend 12-step recovery group  PATIENT/FAMIILY INVOLVEMENT: This treatment plan has been presented to and reviewed with the patient, [name], and/or family member, .  The patient and family have been given the opportunity to ask questions and make suggestions.  [name] 03/12/2012, 2:15 PM

## 2012-03-12 NOTE — Progress Notes (Signed)
Patient ID: Wilhemina Bonito, female   DOB: January 18, 1946, 66 y.o.   MRN: [id number] 03-12-12 nursing adm note: pt came to [facility] voluntarily want to be detoxed from etoh.  ciwa was at a 7. She has been at this facility before on 08-2011.  He substance abuse/dependence has caused her family and legal problems she stated. She denied any si/hi/av. She is allergic to latex, lipitor, pentazocine lactate, codeine and sulfonamide derivatives and is a non smoker. She has no hx of seizures and has a medical hx of diabetes type 2, hyperlipidemia, diverticulitis, adiposity, alcoholism, depression htn and rhinitis. She is a moderate fall risk and her cbg on admission at 1220 was 133. She had no complaints of pain.  She was escorted to the 300 hall and was polite/cooperative on adm.  Report was given to shalita,rn..   Emergency contact: Doyce Para at cell # [phone] or home # [phone]

## 2012-03-12 NOTE — ED Notes (Signed)
Report called-transport in 30-45 mins

## 2012-03-13 ENCOUNTER — Encounter (HOSPITAL_COMMUNITY): Payer: Self-pay | Admitting: Physician Assistant

## 2012-03-13 DIAGNOSIS — F322 Major depressive disorder, single episode, severe without psychotic features: Secondary | ICD-10-CM

## 2012-03-13 DIAGNOSIS — F1010 Alcohol abuse, uncomplicated: Secondary | ICD-10-CM

## 2012-03-13 LAB — GLUCOSE, CAPILLARY
Glucose-Capillary: 107 mg/dL — ABNORMAL HIGH (ref 70–99)
Glucose-Capillary: 159 mg/dL — ABNORMAL HIGH (ref 70–99)
Glucose-Capillary: 129 mg/dL — ABNORMAL HIGH (ref 70–99)

## 2012-03-13 MED ORDER — EZETIMIBE 10 MG PO TABS
10.0000 mg | ORAL_TABLET | Freq: Every day | ORAL | Status: DC
  Administered 2012-03-13 – 2012-03-14 (×2): 10 mg via ORAL
  Filled 2012-03-13 (×4): qty 1

## 2012-03-13 MED ORDER — INSULIN ASPART 100 UNIT/ML ~~LOC~~ SOLN
0.0000 [IU] | Freq: Three times a day (TID) | SUBCUTANEOUS | Status: DC
  Administered 2012-03-13: 2 [IU] via SUBCUTANEOUS

## 2012-03-13 MED ORDER — FISH OIL 1000 MG PO CPDR: 4.0000 | DELAYED_RELEASE_CAPSULE | Freq: Every day | ORAL | Status: DC

## 2012-03-13 MED ORDER — OMEGA-3-ACID ETHYL ESTERS 1 G PO CAPS
2.0000 g | ORAL_CAPSULE | Freq: Every day | ORAL | Status: DC
  Administered 2012-03-13: 2 g via ORAL
  Filled 2012-03-13: qty 1
  Filled 2012-03-13 (×2): qty 2

## 2012-03-13 MED ORDER — LINAGLIPTIN 5 MG PO TABS
5.0000 mg | ORAL_TABLET | Freq: Every day | ORAL | Status: DC
  Administered 2012-03-13 – 2012-03-14 (×2): 5 mg via ORAL
  Filled 2012-03-13 (×3): qty 1

## 2012-03-13 MED ORDER — NEBIVOLOL HCL 5 MG PO TABS
5.0000 mg | ORAL_TABLET | Freq: Every day | ORAL | Status: DC
  Administered 2012-03-13 – 2012-03-14 (×2): 5 mg via ORAL
  Filled 2012-03-13 (×3): qty 1

## 2012-03-13 MED ORDER — CITALOPRAM HYDROBROMIDE 10 MG PO TABS
10.0000 mg | ORAL_TABLET | Freq: Every day | ORAL | Status: DC
  Administered 2012-03-13 – 2012-03-14 (×2): 10 mg via ORAL
  Filled 2012-03-13 (×4): qty 1
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
## 2012-03-13 NOTE — Progress Notes (Signed)
Pt has been up and active in the milieu today.  She has requested  Her prn lomotil twice today for diarrhea.  She stated,"I usually take it double what this is but twice everyday"  Informed her to speak with the doctor today to see if they will change the order for her.  She did voice understanding but no new order for that was written.  However, her new orders were, to check her hgb A1c, carb modified diet, cbg 4x/day, ss insulin with no hs coverage or meal coverage at this time nor basal insulin either.  Pt informed of her new orders and voiced understanding.  Her cbg was 159 which was covered by 2 units of novolog.  On her self-inventory she rated her depression n/a, hopelessness a 2 and her anxiety a 2.  She denied any S/H ideation or A/V hallucinations.  She ws wanting to go home she wrote on her self-inventory "I have a legal issue about being here it will be a violation of my probation and also have a flight on Wednesday morning and I need to get to my probation officer first on Tuesday" Encouraged her to inform the doctor when she spoke with them to see if she could be discharged.  She voiced understanding.  Her CIWA has been a 5 today mainly for her anxiety and headache.  She claims the diarrhea is from her diverticulitis.

## 2012-03-13 NOTE — [facility] Suicide Risk Assessment (Addendum)
Suicide Risk Assessment  Admission Assessment     Demographic factors:  Assessment Details Time of Assessment: Admission Information Obtained From: Patient Current Mental Status:  Current Mental Status:  (appropriate) Loss Factors:  Loss Factors: Decrease in vocational status;Legal issues Historical Factors:  Historical Factors: Family history of suicide;Family history of mental illness or substance abuse;Impulsivity Risk Reduction Factors:  Risk Reduction Factors: Sense of responsibility to family;Living with another person, especially a relative;Positive social support  CLINICAL FACTORS:   Depression:   Impulsivity Severe Alcohol/Substance Abuse/Dependencies More than one psychiatric diagnosis Previous Psychiatric Diagnoses and Treatments  COGNITIVE FEATURES THAT CONTRIBUTE TO RISK:  Polarized thinking    SUICIDE RISK:   Mild:  Suicidal ideation of limited frequency, intensity, duration, and specificity.  There are no identifiable plans, no associated intent, mild dysphoria and related symptoms, good self-control (both objective and subjective assessment), few other risk factors, and identifiable protective factors, including available and accessible social support.  PLAN OF CARE: This pt needed to be in detox so she may meet her probation officer tomorrow.  She wants to receive a travel pass to [place] where she is to 'host a bridal shower for her most favorite niece  [I'm her most favorite aunt].  I will be staying with my brother who has been in recovery for 3 years and go to his AA meetings with him."   Her focus on detox here is observed to be minimal.  She admits to continued grieving for a son who committed suicide because he became a heroin addict.  She helped him many times and pleaded with him to not kill himself.  He asked her to respect his wishes.  He did not want to die an old heroin addict.    [name] 03/13/2012, 3:35 PM

## 2012-03-13 NOTE — Discharge Planning (Signed)
Met with patient briefly to find out if she had any emergent case management needs today.  She reported that she need to leave the hospital today because if she does not report to [employer], she will go to prison.  She reiterated that this would be "prison, not jail."    Per admission notes, she is here for detox from alcohol.  She sees Dr. Evelene Croon in outpatient setting.  Per patient, no case management needs today.  Ambrose Mantle, LCSW 03/13/2012, 1:28 PM

## 2012-03-13 NOTE — Progress Notes (Signed)
Patient ID: [name], female   DOB: [birth year], 66 y.o.   MRN: [id number] Patient was pleasant and cooperative during the assessment. Informed the writer that she'd been sober since Sept 2012 til 3 weeks ago. Stated she wants to get sober because her favorite niece is counting on her to help with a baby or bridal shower.  Stated that her probation officer is very lenient with her, and allows her to travel. "I don't wanna do anything to mess that up". Pt was reluctant to attend group tonight, and asked if it was an "outsider" facilitating. When it was explained that the tech would be doing  it, because the AA rep didn't come. Pt explained that she can't afford to take the chance that somebody from the outside see her and report back to her officer.  Pt attended group. Support and encouragement was offered.

## 2012-03-13 NOTE — Progress Notes (Signed)
Lying quietly in bed with eyes closed.  No further diarrhea or other acute withdrawal symptoms.  Exhibiting normal sleep behavior.  Safety checks are being conducted Q 15 minutes.

## 2012-03-14 LAB — HEMOGLOBIN A1C
Hgb A1c MFr Bld: 6.6 % — ABNORMAL HIGH (ref <5.7)
Mean Plasma Glucose: 143 mg/dL — ABNORMAL HIGH (ref <117)

## 2012-03-14 LAB — GLUCOSE, CAPILLARY
Glucose-Capillary: 118 mg/dL — ABNORMAL HIGH (ref 70–99)
Glucose-Capillary: 118 mg/dL — ABNORMAL HIGH (ref 70–99)

## 2012-03-14 NOTE — [facility] Suicide Risk Assessment (Signed)
Suicide Risk Assessment  Discharge Assessment     Demographic factors:  Caucasian;Unemployed    Current Mental Status Per Nursing Assessment::   On Admission:   (appropriate) At Discharge:     Current Mental Status Per Physician:  Loss Factors: Decrease in vocational status;Legal issues  Historical Factors: Family history of suicide;Family history of mental illness or substance abuse;Impulsivity  Risk Reduction Factors:      Continued Clinical Symptoms:  Depression:   Impulsivity Recent sense of peace/wellbeing Alcohol/Substance Abuse/Dependencies  Discharge Diagnoses:   AXIS I:  Alcohol Abuse and Depressive Disorder NOS AXIS II:  Deferred AXIS III:   Past Medical History  Diagnosis Date  . Alcohol abuse   . Diabetes mellitus   . Hypertension    AXIS IV:  other psychosocial or environmental problems, problems related to legal system/crime and problems with primary support group AXIS V:  61-70 mild symptoms  Cognitive Features That Contribute To Risk:  Thought constriction (tunnel vision)    Suicide Risk:  Minimal: No identifiable suicidal ideation.  Patients presenting with no risk factors but with morbid ruminations; may be classified as minimal risk based on the severity of the depressive symptoms  Plan Of Care/Follow-up recommendations: She has an appt with her probation officer as soon as she is discharged.  Activity:  Pt plans to request Travel Ticket to leave [place] to [place] to attend Neice's  bridal shower [wedding is June 6th].  Pt will attend AA mtgs with sister's husband who is in recovery. Other:  She will go to AA meetings after she returns Sunday and her husband has said if she has another relapse he will seek divorce.  She is encouraged to think recovery and ask him to stop buying wine for her [ she is essentially under house arrest [place] drive [place]  ]   [name] 03/14/2012, 12:25 PM

## 2012-03-14 NOTE — Discharge Summary (Signed)
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 Alcohol abuse   . Diabetes mellitus   . Hypertension    AXIS IV:  problems related to legal system/crime and problems with primary support group AXIS V:  51-60 moderate symptoms  Level of Care:  OP  Hospital Course:  Akirah was admitted to [facility] for detox after she presented to the [facility] requesting assistance with detoxing from alcohol.  She stated that she had tried to detox herself at home with ativan but was unable to complete it due to withdrawal symptoms.  She was admitted tot the floor for stabilization and medication management.  Ms. Bhullar stated that she would need to be out of the hospital by Tuesday as she had an appointment with her probation officer she must keep or end up doing time in prison.  She also stated that she was leaving town on Thursday to attend a wedding for some friends.  Her story was inconsistent from the start.  Avina reported minimal withdrawal symptoms.  Her CIWA scores declined as noted from a 5 yesterday to a 2 today.  She was not interested in residential rehab upon completion of her detox, and was not fully invested in completing her detox, stating her need for a face to face with her probation officer.  She declined our offer to intervene with the officer without revealing her reason for hospitalization.  She denied SI/HI, was in full contact with reality and showed limited insight into her addiction to alcohol, stating  she doubted that she would "learn anything she didn't already know." The treatment team felt that she was safe for discharge.  Consults:  None  Significant Diagnostic Studies:  None  Discharge Vitals:   Blood pressure 122/85, pulse 93, temperature 98.9 F (37.2 C), temperature source Oral, resp. rate 16, height 5' 1.5" (1.562 m), weight 67.586 kg (149 lb).  Mental Status Exam: See Mental Status Examination and Suicide Risk Assessment completed by Attending Physician prior to discharge.  Discharge destination:  Home  Is patient on multiple antipsychotic therapies at discharge:  No   Has Patient had three or more failed trials of antipsychotic monotherapy by history:  No  Recommended Plan for Multiple Antipsychotic Therapies: Not applicable.   Discharge Orders    Future Orders Please Complete By Expires   Diet - low sodium heart healthy      Increase activity slowly      Discharge instructions      Comments:   Take all medication as prescribed.  Follow up with your psychiatrist as planned.     Medication List  As of 03/14/2012  1:03 PM   STOP taking these medications         eszopiclone 3 MG Tabs      HYDROcodone-acetaminophen 5-325 MG per tablet      LORazepam 1 MG tablet         TAKE these medications      Indication  aspirin 81 MG chewable tablet   Chew 1 tablet (81 mg total) by mouth daily.       citalopram 10 MG tablet   Commonly known as: CELEXA   Take 10 mg by mouth daily.       diphenoxylate-atropine 2.5-0.025 MG per tablet   Commonly known as: LOMOTIL   Take 2 tablets by mouth 2 (two) times daily as needed. diarrhea       ezetimibe 10 MG tablet   Commonly known as: ZETIA   Take 10 mg by mouth daily.       Fish Oil 1000 MG Cpdr   Take 4 capsules by mouth at bedtime. For a total of 4000mg  daily       folic acid 1 MG tablet   Commonly known as: FOLVITE   Take 1 mg by mouth daily.       lisinopril 5 MG tablet   Commonly known as: PRINIVIL,ZESTRIL     Take 5 mg by mouth daily.       metFORMIN 750 MG 24 hr tablet   Commonly known as: GLUCOPHAGE-XR   Take 1 tablet (750 mg total) by mouth 2 (two) times daily.       nebivolol 5 MG tablet   Commonly known as: BYSTOLIC   Take 5 mg by mouth daily.       OVER THE COUNTER MEDICATION   Take 1 capsule by mouth daily. Co q 10 200mg        sitaGLIPtin 100 MG tablet   Commonly known as: JANUVIA   Take 100 mg by mouth daily.            Follow-up Information    Follow up with Dr Helane Rima on 04/13/2012. (1:15 PM)    Contact information:   [address]  [phone]         Follow-up recommendations:  Activity:  as tolerated. Diet:  heart healthy Other:  90 meetings in 90 days.  Comments:  Due to Paulett's limited insight and poor motivation to succeed she is at risk for readmission due to relapse.  Signed: Rona Ravens. Giulietta Prokop PAC for  Dr. Mickeal Skinner. 03/14/2012, 1:03 PM

## 2012-03-14 NOTE — Progress Notes (Signed)
Adult Psychosocial Assessment Update Interdisciplinary Team  Previous [facility] admissions/discharges:  Admissions Discharges  Date:08-25-2011 Date:08-27-2011  Date: Date:  Date: Date:  Date: Date:  Date: Date:   Changes since the last Psychosocial Assessment (including adherence to outpatient mental health and/or substance abuse treatment, situational issues contributing to decompensation and/or relapse). Patient reports she requested detox at ED due to upcoming trip to [place] where she will be hosting a bridal shower for favorite niece and needed to stop drinking before flight and before obtaining travel voucher from [employer]. Patient reports she experienced a TIA in November 2012 and recently diverticulitis. Patient has no drivers license thus rarely makes it to an AA meeting and feels she is often isolated .  Patient is concerned regarding probation status and feels any further treatment would effect her status and may cause her to serve active time.              Discharge Plan 1. Will you be returning to the same living situation after discharge?   Yes: X No:      If no, what is your plan?           2. Would you like a referral for services when you are discharged? Yes:     If yes, for what services?  No:   X      Patient sees Dr Evelene Croon and plans to switch to Dr Dub Mikes upond discharge       Summary and Recommendations (to be completed by the evaluator) Patient is 66 YO married Caucasian unemployed female admitted with diagnosis of   Alcohol Dependence. Patient reports last three weeks of drinking have been "most detrimental". Patient will benefit from crisis stabilization, medication evaluation, group therapy and psycho education in addition to case management for discharge planning.                        Signature:  Clide Dales, 03/14/2012 5:29 PM

## 2012-03-14 NOTE — Progress Notes (Signed)
Pt was discharged home today. She denied any S/I H/I or A/V hallucinations.  She was given f/u appointment, rx, sample medications, and hotline info booklet. She voiced understanding to all instructions provided.  She declined the need for smoking cessation materials.  

## 2012-03-14 NOTE — Progress Notes (Signed)
[facility] Group Notes:  (Counselor/Nursing/MHT/Case Management/Adjunct)  03/14/2012 5:41 PM   Type of Therapy:  Processing Group at 11:00 am  Participation Level:  Active  Participation Quality:  Attentive and shearing  Affect:  Appropriate  Cognitive:  Appropriate  Insight:  Good  Engagement in Group:  Good  Engagement in Therapy:  Good  Modes of Intervention:  Exploration, support and socialization.  Summary of Progress/Problems:  [name] shared her struggles with addiction freely in a group setting; when asked to share her struggles with recovery patient had difficulty relating.  [name] shared feelings of discrimination, judgment and misunderstanding of disease is all factors that can often become excuses for relapse. Patient also shared difficulty [place] adds to her tendency to isolate, especially without a driver's license.   [facility] Group Notes:  (Counselor/Nursing/MHT/Case Management/Adjunct)  03/14/2012 5:41 PM   Type of Therapy:  Counseling Group at 1:15 pm  Participation Level:  Active  Participation Quality:  Attentive and sharing until caused out to see physician  Affect:  Appropriate and pleasant  Cognitive:  Appropriate  Insight:  Good  Engagement in Group:  Good   Engagement in Therapy:  Good  Modes of Intervention:  Exploration and socialization  Summary of Progress/Problems:  [name] shared freely her identification with definition of addiction.  Patient was helpful to others in describing symptoms yet unable to describe her own resistance to recovery.  Patient was called out early to meet with physician.   [name], LCSWA 03/14/2012 5:41 PM

## 2012-03-14 NOTE — Progress Notes (Signed)
[facility] Case Management Discharge Plan:  Will you be returning to the same living situation after discharge: Yes,  homne At discharge, do you have transportation home?:Yes,  family Do you have the ability to pay for your medications:Yes,  insurance  Interagency Information:     Release of information consent forms completed and in the chart;  Patient's signature needed at discharge.  Patient to Follow up at:  Follow-up Information    Follow up with Dr Helane Rima on 04/13/2012. (1:15 PM)    Contact information:   [address]  [phone]         Patient denies SI/HI:   Yes,  yes    Aeronautical engineer and Suicide Prevention discussed:  Yes,  yes  Barrier to discharge identified:No.  Summary and Recommendations:   [name] 03/14/2012, 11:10 AM

## 2012-03-15 NOTE — Progress Notes (Signed)
Patient Discharge Instructions:  Psychiatric Admission Assessment Note Provided,  03/15/2012 After Visit Summary (AVS) Provided,  03/15/2012 Face Sheet Provided, 03/15/2012 Faxed/Sent to the Next Level Care provider:  03/15/2012 Sent Suicide Risk Assessment - Discharge Assessment 03/15/2012  Faxed to Dr. Evelene Croon @ [phone]  Wandra Scot, 03/15/2012, 3:45 PM

## 2012-03-28 DIAGNOSIS — R7982 Elevated C-reactive protein (CRP): Secondary | ICD-10-CM | POA: Insufficient documentation

## 2012-03-28 DIAGNOSIS — R110 Nausea: Secondary | ICD-10-CM | POA: Insufficient documentation

## 2012-03-28 DIAGNOSIS — IMO0001 Reserved for inherently not codable concepts without codable children: Secondary | ICD-10-CM

## 2012-03-28 DIAGNOSIS — Z79899 Other long term (current) drug therapy: Secondary | ICD-10-CM | POA: Insufficient documentation

## 2012-03-28 DIAGNOSIS — R000 Tachycardia, unspecified: Secondary | ICD-10-CM | POA: Insufficient documentation

## 2012-03-28 DIAGNOSIS — R079 Chest pain, unspecified: Secondary | ICD-10-CM | POA: Insufficient documentation

## 2012-03-28 DIAGNOSIS — Z9889 Other specified postprocedural states: Secondary | ICD-10-CM | POA: Insufficient documentation

## 2012-03-28 DIAGNOSIS — E119 Type 2 diabetes mellitus without complications: Secondary | ICD-10-CM | POA: Insufficient documentation

## 2012-03-28 HISTORY — DX: Reserved for inherently not codable concepts without codable children: IMO0001

## 2012-05-28 ENCOUNTER — Encounter (HOSPITAL_COMMUNITY): Payer: Self-pay | Admitting: Emergency Medicine

## 2012-05-28 ENCOUNTER — Inpatient Hospital Stay (HOSPITAL_COMMUNITY)
Admission: AD | Admit: 2012-05-28 | Discharge: 2012-05-31 | DRG: 897 | Disposition: A | Discharge status: Home / Self Care | Payer: 59 | Source: Ambulatory Visit | Attending: Psychiatry | Admitting: Psychiatry

## 2012-05-28 ENCOUNTER — Emergency Department (HOSPITAL_COMMUNITY)
Admission: EM | Admit: 2012-05-28 | Discharge: 2012-05-28 | Disposition: A | Discharge status: Other Acute Inpatient Hospital | Payer: 59 | Attending: Emergency Medicine | Admitting: Emergency Medicine

## 2012-05-28 DIAGNOSIS — F10939 Alcohol use, unspecified with withdrawal, unspecified: Principal | ICD-10-CM | POA: Diagnosis present

## 2012-05-28 DIAGNOSIS — I10 Essential (primary) hypertension: Secondary | ICD-10-CM | POA: Insufficient documentation

## 2012-05-28 DIAGNOSIS — F32A Depression, unspecified: Secondary | ICD-10-CM

## 2012-05-28 DIAGNOSIS — Z046 Encounter for general psychiatric examination, requested by authority: Secondary | ICD-10-CM | POA: Insufficient documentation

## 2012-05-28 DIAGNOSIS — F10239 Alcohol dependence with withdrawal, unspecified: Principal | ICD-10-CM | POA: Diagnosis present

## 2012-05-28 DIAGNOSIS — F1020 Alcohol dependence, uncomplicated: Secondary | ICD-10-CM

## 2012-05-28 DIAGNOSIS — Z79899 Other long term (current) drug therapy: Secondary | ICD-10-CM | POA: Insufficient documentation

## 2012-05-28 DIAGNOSIS — F1994 Other psychoactive substance use, unspecified with psychoactive substance-induced mood disorder: Secondary | ICD-10-CM

## 2012-05-28 DIAGNOSIS — F1010 Alcohol abuse, uncomplicated: Secondary | ICD-10-CM | POA: Diagnosis present

## 2012-05-28 DIAGNOSIS — Z7982 Long term (current) use of aspirin: Secondary | ICD-10-CM | POA: Insufficient documentation

## 2012-05-28 DIAGNOSIS — IMO0001 Reserved for inherently not codable concepts without codable children: Secondary | ICD-10-CM | POA: Diagnosis present

## 2012-05-28 DIAGNOSIS — F411 Generalized anxiety disorder: Secondary | ICD-10-CM

## 2012-05-28 DIAGNOSIS — E119 Type 2 diabetes mellitus without complications: Secondary | ICD-10-CM | POA: Insufficient documentation

## 2012-05-28 DIAGNOSIS — F329 Major depressive disorder, single episode, unspecified: Secondary | ICD-10-CM

## 2012-05-28 DIAGNOSIS — F1921 Other psychoactive substance dependence, in remission: Secondary | ICD-10-CM | POA: Diagnosis present

## 2012-05-28 LAB — COMPREHENSIVE METABOLIC PANEL
ALT: 31 U/L (ref 0–35)
AST: 35 U/L (ref 0–37)
Albumin: 3.8 g/dL (ref 3.5–5.2)
Alkaline Phosphatase: 114 U/L (ref 39–117)
BUN: 14 mg/dL (ref 6–23)
CO2: 21 mEq/L (ref 19–32)
Calcium: 8.9 mg/dL (ref 8.4–10.5)
Chloride: 92 mEq/L — ABNORMAL LOW (ref 96–112)
Creatinine, Ser: 0.46 mg/dL — ABNORMAL LOW (ref 0.50–1.10)
GFR calc Af Amer: >90 mL/min (ref >90)
GFR calc non Af Amer: >90 mL/min (ref >90)
Glucose, Bld: 185 mg/dL — ABNORMAL HIGH (ref 70–99)
Potassium: 3.9 mEq/L (ref 3.5–5.1)
Sodium: 132 mEq/L — ABNORMAL LOW (ref 135–145)
Total Bilirubin: 0.4 mg/dL (ref 0.3–1.2)
Total Protein: 7.8 g/dL (ref 6.0–8.3)

## 2012-05-28 LAB — CBC
HCT: 41.1 % (ref 36.0–46.0)
Hemoglobin: 14.1 g/dL (ref 12.0–15.0)
MCH: 31.9 pg (ref 26.0–34.0)
MCHC: 34.3 g/dL (ref 30.0–36.0)
MCV: 93 fL (ref 78.0–100.0)
Platelets: 319 10*3/uL (ref 150–400)
RBC: 4.42 MIL/uL (ref 3.87–5.11)
RDW: 14.5 % (ref 11.5–15.5)
WBC: 9.2 10*3/uL (ref 4.0–10.5)

## 2012-05-28 LAB — URINALYSIS, ROUTINE W REFLEX MICROSCOPIC
Bilirubin Urine: NEGATIVE
Glucose, UA: NEGATIVE mg/dL
Ketones, ur: NEGATIVE mg/dL
Nitrite: NEGATIVE
Protein, ur: 30 mg/dL — AB
Specific Gravity, Urine: 1.017 (ref 1.005–1.030)
Urobilinogen, UA: 0.2 mg/dL (ref 0.0–1.0)
pH: 5.5 (ref 5.0–8.0)

## 2012-05-28 LAB — DIFFERENTIAL
Basophils Absolute: 0.1 10*3/uL (ref 0.0–0.1)
Basophils Relative: 1 % (ref 0–1)
Eosinophils Absolute: 0.1 10*3/uL (ref 0.0–0.7)
Eosinophils Relative: 1 % (ref 0–5)
Lymphocytes Relative: 36 % (ref 12–46)
Lymphs Abs: 3.3 10*3/uL (ref 0.7–4.0)
Monocytes Absolute: 0.9 10*3/uL (ref 0.1–1.0)
Monocytes Relative: 10 % (ref 3–12)
Neutro Abs: 4.9 10*3/uL (ref 1.7–7.7)
Neutrophils Relative %: 53 % (ref 43–77)

## 2012-05-28 LAB — URINE MICROSCOPIC-ADD ON

## 2012-05-28 LAB — RAPID URINE DRUG SCREEN, HOSP PERFORMED
Amphetamines: NOT DETECTED
Barbiturates: NOT DETECTED
Benzodiazepines: NOT DETECTED
Cocaine: NOT DETECTED
Opiates: NOT DETECTED
Tetrahydrocannabinol: NOT DETECTED

## 2012-05-28 LAB — ETHANOL: Alcohol, Ethyl (B): 166 mg/dL — ABNORMAL HIGH (ref 0–11)

## 2012-05-28 LAB — GLUCOSE, CAPILLARY: Glucose-Capillary: 137 mg/dL — ABNORMAL HIGH (ref 70–99)

## 2012-05-28 MED ORDER — CHLORDIAZEPOXIDE HCL 25 MG PO CAPS: 25.0000 mg | ORAL_CAPSULE | ORAL | Status: DC

## 2012-05-28 MED ORDER — CHLORDIAZEPOXIDE HCL 25 MG PO CAPS: 25.0000 mg | ORAL_CAPSULE | Freq: Every day | ORAL | Status: DC

## 2012-05-28 MED ORDER — VITAMIN B-1 100 MG PO TABS
100.0000 mg | ORAL_TABLET | Freq: Every day | ORAL | Status: DC
  Administered 2012-05-28: 100 mg via ORAL
  Filled 2012-05-28: qty 1

## 2012-05-28 MED ORDER — OMEGA-3-ACID ETHYL ESTERS 1 G PO CAPS
2.0000 g | ORAL_CAPSULE | Freq: Every day | ORAL | Status: DC
  Administered 2012-05-28 – 2012-05-31 (×4): 2 g via ORAL
  Filled 2012-05-28 (×3): qty 2
  Filled 2012-05-28: qty 1
  Filled 2012-05-28 (×3): qty 2

## 2012-05-28 MED ORDER — ASPIRIN 81 MG PO CHEW
81.0000 mg | CHEWABLE_TABLET | Freq: Every day | ORAL | Status: DC
  Administered 2012-05-28 – 2012-05-31 (×4): 81 mg via ORAL
  Filled 2012-05-28 (×7): qty 1

## 2012-05-28 MED ORDER — METFORMIN HCL ER 750 MG PO TB24
750.0000 mg | ORAL_TABLET | Freq: Two times a day (BID) | ORAL | Status: DC
  Administered 2012-05-28 – 2012-05-31 (×6): 750 mg via ORAL
  Filled 2012-05-28 (×12): qty 1

## 2012-05-28 MED ORDER — LOPERAMIDE HCL 2 MG PO CAPS
2.0000 mg | ORAL_CAPSULE | ORAL | Status: AC | PRN
  Administered 2012-05-29: 4 mg via ORAL
  Administered 2012-05-31: 2 mg via ORAL
  Filled 2012-05-28: qty 1

## 2012-05-28 MED ORDER — ADULT MULTIVITAMIN W/MINERALS CH
1.0000 | ORAL_TABLET | Freq: Every day | ORAL | Status: DC
  Administered 2012-05-28: 1 via ORAL
  Filled 2012-05-28: qty 1

## 2012-05-28 MED ORDER — CHLORDIAZEPOXIDE HCL 25 MG PO CAPS
25.0000 mg | ORAL_CAPSULE | Freq: Four times a day (QID) | ORAL | Status: AC
  Administered 2012-05-28 – 2012-05-29 (×5): 25 mg via ORAL
  Filled 2012-05-28 (×7): qty 1

## 2012-05-28 MED ORDER — DIPHENOXYLATE-ATROPINE 2.5-0.025 MG PO TABS
2.0000 | ORAL_TABLET | Freq: Every day | ORAL | Status: DC
  Administered 2012-05-28 – 2012-05-31 (×4): 2 via ORAL
  Filled 2012-05-28 (×4): qty 2

## 2012-05-28 MED ORDER — CHLORDIAZEPOXIDE HCL 25 MG PO CAPS
25.0000 mg | ORAL_CAPSULE | Freq: Four times a day (QID) | ORAL | Status: AC | PRN
  Administered 2012-05-28 – 2012-05-30 (×2): 25 mg via ORAL
  Filled 2012-05-28 (×2): qty 1

## 2012-05-28 MED ORDER — ONDANSETRON 4 MG PO TBDP
4.0000 mg | ORAL_TABLET | Freq: Four times a day (QID) | ORAL | Status: AC | PRN
Start: 2012-05-28 — End: 2012-05-31

## 2012-05-28 MED ORDER — LISINOPRIL 5 MG PO TABS
5.0000 mg | ORAL_TABLET | Freq: Every day | ORAL | Status: DC
  Administered 2012-05-28 – 2012-05-31 (×4): 5 mg via ORAL
  Filled 2012-05-28 (×7): qty 1

## 2012-05-28 MED ORDER — HYDROXYZINE HCL 25 MG PO TABS
25.0000 mg | ORAL_TABLET | Freq: Four times a day (QID) | ORAL | Status: AC | PRN
  Administered 2012-05-28 – 2012-05-30 (×2): 25 mg via ORAL

## 2012-05-28 MED ORDER — MAGNESIUM HYDROXIDE 400 MG/5ML PO SUSP: 30.0000 mL | Freq: Every day | ORAL | Status: DC | PRN

## 2012-05-28 MED ORDER — NEBIVOLOL HCL 5 MG PO TABS
5.0000 mg | ORAL_TABLET | Freq: Every day | ORAL | Status: DC
  Administered 2012-05-28 – 2012-05-29 (×2): 5 mg via ORAL
  Filled 2012-05-28 (×5): qty 1

## 2012-05-28 MED ORDER — CEPHALEXIN 500 MG PO CAPS: 500.0000 mg | ORAL_CAPSULE | Freq: Four times a day (QID) | ORAL | Status: DC

## 2012-05-28 MED ORDER — ADULT MULTIVITAMIN W/MINERALS CH
1.0000 | ORAL_TABLET | Freq: Every day | ORAL | Status: DC
  Administered 2012-05-28 – 2012-05-31 (×4): 1 via ORAL
  Filled 2012-05-28 (×7): qty 1

## 2012-05-28 MED ORDER — VITAMIN B-1 100 MG PO TABS
100.0000 mg | ORAL_TABLET | Freq: Every day | ORAL | Status: DC
  Administered 2012-05-29 – 2012-05-31 (×3): 100 mg via ORAL
  Filled 2012-05-28 (×5): qty 1

## 2012-05-28 MED ORDER — THIAMINE HCL 100 MG/ML IJ SOLN: 100.0000 mg | Freq: Once | INTRAMUSCULAR | Status: DC

## 2012-05-28 MED ORDER — CITALOPRAM HYDROBROMIDE 10 MG PO TABS
10.0000 mg | ORAL_TABLET | Freq: Every day | ORAL | Status: DC
  Administered 2012-05-28 – 2012-05-31 (×4): 10 mg via ORAL
  Filled 2012-05-28 (×7): qty 1

## 2012-05-28 MED ORDER — ALUM & MAG HYDROXIDE-SIMETH 200-200-20 MG/5ML PO SUSP: 30.0000 mL | ORAL | Status: DC | PRN

## 2012-05-28 MED ORDER — FOLIC ACID 1 MG PO TABS
1.0000 mg | ORAL_TABLET | Freq: Every day | ORAL | Status: DC
  Administered 2012-05-28: 1 mg via ORAL
  Filled 2012-05-28: qty 1

## 2012-05-28 MED ORDER — CHLORDIAZEPOXIDE HCL 25 MG PO CAPS
50.0000 mg | ORAL_CAPSULE | Freq: Once | ORAL | Status: AC
  Administered 2012-05-29: 50 mg via ORAL
  Filled 2012-05-28: qty 2

## 2012-05-28 MED ORDER — ACETAMINOPHEN 325 MG PO TABS
650.0000 mg | ORAL_TABLET | Freq: Four times a day (QID) | ORAL | Status: DC | PRN
  Administered 2012-05-29 – 2012-05-30 (×3): 650 mg via ORAL

## 2012-05-28 MED ORDER — EZETIMIBE 10 MG PO TABS
10.0000 mg | ORAL_TABLET | Freq: Every day | ORAL | Status: DC
  Administered 2012-05-28 – 2012-05-31 (×4): 10 mg via ORAL
  Filled 2012-05-28 (×8): qty 1

## 2012-05-28 MED ORDER — LORAZEPAM 1 MG PO TABS
1.0000 mg | ORAL_TABLET | Freq: Four times a day (QID) | ORAL | Status: DC | PRN
  Administered 2012-05-28: 1 mg via ORAL
  Filled 2012-05-28: qty 1

## 2012-05-28 MED ORDER — CHLORDIAZEPOXIDE HCL 25 MG PO CAPS
25.0000 mg | ORAL_CAPSULE | Freq: Three times a day (TID) | ORAL | Status: AC
  Administered 2012-05-30: 25 mg via ORAL
  Filled 2012-05-28 (×2): qty 1
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
## 2012-05-28 NOTE — ED Notes (Signed)
Report called to [facility] at 1400

## 2012-05-28 NOTE — ED Notes (Signed)
Per [facility], pt has been accepted by Dr. Theotis Barrio to [facility] Room 303 Bed 1. Writer will notify RN and MD.  Janann Colonel., MSW, [facility] Clinical Social Worker [phone]

## 2012-05-28 NOTE — Progress Notes (Signed)
Patient ID: [name], female   DOB: [birth year], 66 y.o.   MRN: [id number] Did not attend   [name]

## 2012-05-28 NOTE — ED Notes (Signed)
Info faxed to [facility] for review as possible disposition.

## 2012-05-28 NOTE — Tx Team (Signed)
Initial Interdisciplinary Treatment Plan  PATIENT STRENGTHS: (choose at least two) Ability for insight Average or above average intelligence Communication skills Financial means Motivation for treatment/growth  PATIENT STRESSORS: Health problems Legal issue Marital or family conflict   PROBLEM LIST: Problem List/Patient Goals Date to be addressed Date deferred Reason deferred Estimated date of resolution  Patient desires safe etoh detox 05-28-12     Patient desires effective coping skills for stressors 05-28-12     Patient desires addiction specialist  following inpatient discharge 05-28-12                                          DISCHARGE CRITERIA:  Ability to meet basic life and health needs Improved stabilization in mood, thinking, and/or behavior Motivation to continue treatment in a less acute level of care Verbal commitment to aftercare and medication compliance  PRELIMINARY DISCHARGE PLAN: Attend 12-step recovery group Participate in family therapy Return to previous living arrangement  PATIENT/FAMIILY INVOLVEMENT: This treatment plan has been presented to and reviewed with the patient, [name], and/or family member, .  The patient and family have been given the opportunity to ask questions and make suggestions.  Cresenciano Lick 05/28/2012, 5:21 PM

## 2012-05-28 NOTE — ED Notes (Signed)
Sitting on the bed eatting lunch.  Pt reports increased anxiety and is requesting medication.  Pt reports that her grandchildren are coming her to visit and her daughter has zero tolerance for the alcohol and that is why she is here to detox.  Pleasant, smiling, nad. Procedures explained

## 2012-05-28 NOTE — ED Notes (Addendum)
Pt removed 1 pair of yellow colored hoop earrings & gave to husband, spouse placed them in his pocket to take home.

## 2012-05-28 NOTE — Progress Notes (Signed)
Pt is a 66 year old Caucasion female admitted to the services of Dr. Koren Shiver for detox from ETOH.  Pt's last drink was at 4 AM this morning and she is very restless and tremulous on approach.  Pt states she just can't be still or lay on the bed.  Pt denies SI/HI/hallucinations.  Unable to participate in group this evening due to heavy withdrawal symptoms.  Pt given prns for withdrawal and doctor on call notified of symptoms.  Orders received.  Support and encouragement offered, will continue to monitor.

## 2012-05-28 NOTE — Progress Notes (Signed)
Patient ID: [name], female   DOB: [birth year], 66 y.o.   MRN: [id number] Admission note nsg- This is a 66 y/o married  female admitted following medical clearance from [facility] requesting etoh detox.  [name] reports a long history of alcohol abuse starting at age 73. Longest period of sobriety is 6 years but has only had short intervals of recovery in the past 13 years. 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  Last drink was 4am day of admission: 2 mini wine boxes. ED etoh level at166. Medical hx of HTN,NIDDM,diverticulitis. Is currently experiencing stool incontinence.  Presents disheveled and irritable. Reports that she is on probation in [place] for DUI and if her PO finds that she is here she could be facing prison time. Is skeptical about attending AA due to "my confidentiality being violated previously in the rooms." Also reports previous admission here "about 6 months ago". Hygiene done during search. Orientation to unit. Food/fluids offered. 15' checks initiated and remains safe.

## 2012-05-28 NOTE — Progress Notes (Signed)
Pt stated that she "thought" she was "ok" with therapist calling her husband but was "unsure" pt gave husband's name and telephone number Satya Bohall [phone]) but did not sign consent.

## 2012-05-28 NOTE — ED Notes (Signed)
Crackers and juice.

## 2012-05-28 NOTE — ED Notes (Signed)
Greg ACT into see 

## 2012-05-28 NOTE — ED Notes (Signed)
Info faxed to [facility] for review as possible disposition.

## 2012-05-28 NOTE — ED Notes (Signed)
Pt and pt's belongings wanded by security.  

## 2012-05-28 NOTE — Progress Notes (Signed)
Adult Psychosocial Assessment Update Interdisciplinary Team  [facility] admissions/discharges:  Admissions Discharges  Date: 08/25/2011 Date: 08/27/2011  Date: 03/12/2012 Date: 03/14/2012  Date: Date:  Date: Date:  Date: Date:   Changes since the last Psychosocial Assessment (including adherence to outpatient mental health and/or substance abuse treatment, situational issues contributing to decompensation and/or relapse). Pt. was in [place] for the month of May and stated that she attended AA meetings almost daily. Her husband was transferred to [facility] for his job and pt. has been miserable ever since, stating that she doesn't like it here. Pt. began heavily drinking approximately 2 weeks ago and said she was triggered by feeling lonely and being away from her family and that her marriage is falling apart.              Discharge Plan 1. Will you be returning to the same living situation after discharge?   Yes: X No:      If no, what is your plan?           2. Would you like a referral for services when you are discharged? Yes:X     If yes, for what services?  No:       Pt. likes [facility] but would also like connection to more support groups.        Summary and Recommendations (to be completed by the evaluator) Pt. is a 66 year old female. Recommendations for treatment include crisis stabilization, case management, medication management, psychoeducation to teach coping skills, and group therapy.                        Signature:  Cassidi Long, 05/28/2012 5:03 PM

## 2012-05-28 NOTE — ED Notes (Signed)
Feeling some better on dc.  Pt dc'd ambulatory w/ security and NT to [facility], 1 bag of belongings sent w/ pt

## 2012-05-28 NOTE — ED Notes (Signed)
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

## 2012-05-28 NOTE — ED Notes (Addendum)
Pt requesting detox from alcohol.  Pt's husband states pt drinks about 3 bottles of wine a day, last drink about 1 hr pta.  Pt denies drug use, denies SI/HI.

## 2012-05-28 NOTE — ED Notes (Signed)
Spoke to Dr. Denton Lank, is to place orders to move to Psych ED & ETOH protocol, Wille Celeste, RN informed.

## 2012-05-28 NOTE — ED Notes (Signed)
Up to the desk on the phone 

## 2012-05-28 NOTE — ED Notes (Signed)
Up to the bathroom 

## 2012-05-28 NOTE — ED Provider Notes (Addendum)
History     CSN: [id number]  Arrival date & time 05/28/12  5284   First MD Initiated Contact with Patient 05/28/12 [phone]      Chief Complaint  Patient presents with  . V70.1    (Consider location/radiation/quality/duration/timing/severity/associated sxs/prior treatment) The history is provided by the patient.  pt with hx chronic etoh abuse, states is here to get into rehab program. Is drinking daily, > 1 bottle wine per day. Drank this morning. States if stops will feel shaky but denies hx seizures or dts. States recent health at baseline. Notes hx depression but denies any worsening depression or thoughts of harm to self or others. No nv. Normal appetite. Sleeping normally. No abd pain. No headaches. Denies recent injury. No pain.      Past Medical History  Diagnosis Date  . Alcohol abuse   . Diabetes mellitus   . Hypertension     Past Surgical History  Procedure Date  . Cesarean section   . Tonsillectomy   . Bunionectomy     History reviewed. No pertinent family history.  History  Substance Use Topics  . Smoking status: Former Smoker    Quit date: 03/11/2010  . Smokeless tobacco: Never Used  . Alcohol Use: Yes     4-5 bottles of wine per day    OB History    Grav Para Term Preterm Abortions TAB SAB Ect Mult Living                  Review of Systems  Constitutional: Negative for fever.  HENT: Negative for neck pain.   Eyes: Negative for pain.  Respiratory: Negative for shortness of breath.   Cardiovascular: Negative for chest pain.  Gastrointestinal: Negative for abdominal pain.  Genitourinary: Negative for flank pain.  Musculoskeletal: Negative for back pain.  Skin: Negative for rash.  Neurological: Negative for weakness, numbness and headaches.  Hematological: Does not bruise/bleed easily.  Psychiatric/Behavioral: Negative for agitation.    Allergies  Other; Latex; Lipitor; Pentazocine lactate; Codeine; and Sulfonamide derivatives  Home  Medications   Current Outpatient Rx  Name Route Sig Dispense Refill  . ASPIRIN 81 MG PO CHEW Oral Chew 1 tablet (81 mg total) by mouth daily. 30 tablet 0  . CITALOPRAM HYDROBROMIDE 10 MG PO TABS Oral Take 10 mg by mouth daily.    Marland Kitchen DIPHENOXYLATE-ATROPINE 2.5-0.025 MG PO TABS Oral Take 2 tablets by mouth 2 (two) times daily as needed. diarrhea     . EZETIMIBE 10 MG PO TABS Oral Take 10 mg by mouth daily.      Marland Kitchen LISINOPRIL 5 MG PO TABS Oral Take 5 mg by mouth daily.      Marland Kitchen METFORMIN HCL ER 750 MG PO TB24 Oral Take 1 tablet (750 mg total) by mouth 2 (two) times daily. 750 tablet 3    Resume on WEDNESDAY  . NEBIVOLOL HCL 5 MG PO TABS Oral Take 5 mg by mouth daily.      Marland Kitchen FISH OIL 1000 MG PO CPDR Oral Take 4 capsules by mouth at bedtime. For a total of 4000mg  daily       BP 138/85  Pulse 109  Temp 98.1 F (36.7 C) (Oral)  Resp 16  SpO2 96%  Physical Exam  Nursing note and vitals reviewed. Constitutional: She is oriented to person, place, and time. She appears well-developed and well-nourished. No distress.  HENT:  Head: Atraumatic.  Eyes: Conjunctivae are normal. Pupils are equal, round, and reactive to light. No  scleral icterus.  Neck: Neck supple. No tracheal deviation present.  Cardiovascular: Normal rate, regular rhythm, normal heart sounds and intact distal pulses.   Pulmonary/Chest: Effort normal and breath sounds normal. No respiratory distress.  Abdominal: Soft. Normal appearance and bowel sounds are normal. She exhibits no distension. There is no tenderness.  Musculoskeletal: She exhibits no edema.       CTLS spine, non tender, aligned, no step off.   Neurological: She is alert and oriented to person, place, and time.       Motor intact bil. Steady gait. No tremor or shakes.   Skin: Skin is warm and dry. No rash noted.  Psychiatric: She has a normal mood and affect.    ED Course  Procedures (including critical care time)   Labs Reviewed  ETHANOL  URINE RAPID DRUG  SCREEN (HOSP PERFORMED)  CBC  DIFFERENTIAL  COMPREHENSIVE METABOLIC PANEL  URINALYSIS, ROUTINE W REFLEX MICROSCOPIC    Results for orders placed during the hospital encounter of 05/28/12  ETHANOL      Component Value Range   Alcohol, Ethyl (B) 166 (*) 0 - 11 mg/dL  CBC      Component Value Range   WBC 9.2  4.0 - 10.5 K/uL   RBC 4.42  3.87 - 5.11 MIL/uL   Hemoglobin 14.1  12.0 - 15.0 g/dL   HCT 40.9  81.1 - 91.4 %   MCV 93.0  78.0 - 100.0 fL   MCH 31.9  26.0 - 34.0 pg   MCHC 34.3  30.0 - 36.0 g/dL   RDW 78.2  95.6 - 21.3 %   Platelets 319  150 - 400 K/uL  DIFFERENTIAL      Component Value Range   Neutrophils Relative 53  43 - 77 %   Neutro Abs 4.9  1.7 - 7.7 K/uL   Lymphocytes Relative 36  12 - 46 %   Lymphs Abs 3.3  0.7 - 4.0 K/uL   Monocytes Relative 10  3 - 12 %   Monocytes Absolute 0.9  0.1 - 1.0 K/uL   Eosinophils Relative 1  0 - 5 %   Eosinophils Absolute 0.1  0.0 - 0.7 K/uL   Basophils Relative 1  0 - 1 %   Basophils Absolute 0.1  0.0 - 0.1 K/uL  COMPREHENSIVE METABOLIC PANEL      Component Value Range   Sodium 132 (*) 135 - 145 mEq/L   Potassium 3.9  3.5 - 5.1 mEq/L   Chloride 92 (*) 96 - 112 mEq/L   CO2 21  19 - 32 mEq/L   Glucose, Bld 185 (*) 70 - 99 mg/dL   BUN 14  6 - 23 mg/dL   Creatinine, Ser 0.86 (*) 0.50 - 1.10 mg/dL   Calcium 8.9  8.4 - 57.8 mg/dL   Total Protein 7.8  6.0 - 8.3 g/dL   Albumin 3.8  3.5 - 5.2 g/dL   AST 35  0 - 37 U/L   ALT 31  0 - 35 U/L   Alkaline Phosphatase 114  39 - 117 U/L   Total Bilirubin 0.4  0.3 - 1.2 mg/dL   GFR calc non Af Amer >90  >90 mL/min   GFR calc Af Amer >90  >90 mL/min      MDM  Labs sent from triage.    Pt moved to psych ed pending act eval.   Temp psych orders and ciwa etoh withdrawal prevention protocol ordered.  Recheck pt no tremor or shakes.  Act team indicates pt accepted to [facility], Dr Moises Blood, bed ready    Suzi Roots, MD 05/28/12 1610  Suzi Roots, MD 05/28/12 1252

## 2012-05-29 DIAGNOSIS — F1010 Alcohol abuse, uncomplicated: Secondary | ICD-10-CM | POA: Diagnosis present

## 2012-05-29 LAB — GLUCOSE, CAPILLARY
Glucose-Capillary: 134 mg/dL — ABNORMAL HIGH (ref 70–99)
Glucose-Capillary: 127 mg/dL — ABNORMAL HIGH (ref 70–99)

## 2012-05-29 MED ORDER — INSULIN ASPART 100 UNIT/ML ~~LOC~~ SOLN: 4.0000 [IU] | Freq: Three times a day (TID) | SUBCUTANEOUS | Status: DC

## 2012-05-29 MED ORDER — NEBIVOLOL HCL 5 MG PO TABS
5.0000 mg | ORAL_TABLET | Freq: Every day | ORAL | Status: DC
  Administered 2012-05-29 – 2012-05-31 (×3): 5 mg via ORAL
  Filled 2012-05-29 (×5): qty 1

## 2012-05-29 MED ORDER — INSULIN ASPART 100 UNIT/ML ~~LOC~~ SOLN: 0.0000 [IU] | Freq: Three times a day (TID) | SUBCUTANEOUS | Status: DC

## 2012-05-29 NOTE — [facility] Suicide Risk Assessment (Signed)
Suicide Risk Assessment  Admission Assessment      Demographic factors:  See chart.  Current Mental Status:  Patient seen and evaluated. Chart reviewed. Patient stated that her mood was "ok". Her affect was mood congruent and euthymic. She denied any current thoughts of self injurious behavior, suicidal ideation or homicidal ideation. There were no auditory or visual hallucinations, paranoia, delusional thought processes, or mania noted.  Thought process was linear and goal directed.  No psychomotor agitation or retardation was noted. Speech was normal rate, tone and volume. Eye contact was good. Judgment and insight are limited.  Patient has been up and engaged on the unit.  No acute safety concerns reported from team.  No sig withdrawal s/s noted at this time with use of standard librium taper, yet HR remains elevated.  Reviewed outside meds with pt and d/c insulin which pt does not take at home.  Loss Factors:  Son committed suicide; trauma  Historical Factors:  Polysubstance Dep, in remission (cocaine, cannabis, benzos); DUIs; Hx SI with attempts while intoxicated  Risk Reduction Factors: motivated by grandchildren coming this wkend to get clean; married   CLINICAL FACTORS: Alcohol Dependence & W/D; Polysubstance Dependence, in remission (Cocaine, Cannabis & Benzodiazepines); SIMD  COGNITIVE FEATURES THAT CONTRIBUTE TO RISK: limited insight.  SUICIDE RISK: Pt viewed as a chronic increased risk of harm to self in light of her past hx and risk factors.  No acute safety concerns on the unit.  Pt contracting for safety and in need of crisis stabilization, detox & Tx.  PLAN OF CARE: Pt admitted for crisis stabilization, detox off alcohol and treatment.  Please see orders.   Medications reviewed with pt and medication education provided.  Will continue q15 minute checks per unit protocol.  No clinical indication for one on one level of observation at this time.  Pt contracting for safety.  Mental  health treatment, medication management and continued sobriety will mitigate against the increased risk of harm to self and/or others.  Discussed the importance of recovery with pt, as well as, tools to move forward in a healthy & safe manner.  Pt agreeable with the plan.  Discussed with the team.   Lupe Carney 05/29/2012, 4:39 PM

## 2012-05-29 NOTE — Progress Notes (Signed)
[facility] Group Notes:  (Counselor/Nursing/MHT/Case Management/Adjunct)  05/29/2012 4:40 PM  Type of Therapy:  Group Therapy at 11 and 1`:15  Participation Level:  Did Not Attend  Clide Dales 05/29/2012, 4:40 PM

## 2012-05-29 NOTE — Discharge Planning (Signed)
Returning pt states she is here for detox "because my grandchildren are coming for the summer this weekend and can't take care of them drunk."  No rehab.  Return home, sobriety plan unclear.

## 2012-05-29 NOTE — Progress Notes (Signed)
Brief Nutrition Note  Reason: Patient screened at nutrition risk for unintentional weight loss > 10 lb over 1 month.   Patient reported her appetite and intake are well. She reported she has been loosing weight intentionally by eating healthy and watching portion sizes. She reported to eat six small meals throughout the day for blood sugar control. She reported eating healthier is helping her blood sugar to be lower. She reported she checks her blood sugar regularly and watches her carbohydrate intake. The patient is without any nutrition related questions. Patient not at nutrition risk at this time.   RD available for nutrition needs.   Iven Finn [facility] [phone]

## 2012-05-29 NOTE — H&P (Signed)
Psychiatric Admission Assessment Adult  Patient Identification:  [name]  Date of Evaluation:  05/29/2012  Chief Complaint:  Alcohol Dependence 303.90  History of Present Illness: This is a 65 year old [name], admitted to [facility] from the [facility] ED with complaints of Excessive alcohol drinking and a request for detoxification. Patient reports, "I am an alcoholic. I started drinking excessively again 2 weeks ago. It got worse and excalated x 1 week. I know I could not stop drinking on my own. I have been in and out of AA meetings x 30 years. I have been in this hospital for detoxification x 2 in the past. Part of my reason for needing detoxification again is because, I will be getting my 2 grand-daughters this summer. I will be keeping them for the rest of the summer break. They are coming from [place]. There is no possible way that I can take care of them while intoxicated at the same time. I drink 4 boxes of the mini bottles of wine daily. Alcoholism is not the only problems that I am dealing with at home. I am not here to elaborate my problems that I have going on at my home, rather to get the help I need to stop drinking alcohol.Some changes need to be made in my home. I drink to cope. Drinking alcohol also makes me high, then I will forget my pains emotionally. I use it to cope with my health problems also. I have diabetes. I have been in out of the hospital because of it. I have been to several other treatment centers for alcohol treatment. They include, [facility] x 2, [facility], [address], [facility] [address] and [address]. The longest time that I have been sober was 6 years. I had a son who committed suicide, does that tell you something?"  ROS: Patient is alert and oriented x 3. She is aware of situations. She currently denies any signs of shortness of breath, chest pains and or discomforts. Denies any tremors, headaches and or diaphoresis an or seizures.  However admits  intermittent abdominal cramps.  Mood Symptoms:  Helplessness, Hopelessness, Past 2 Weeks, Sadness, Worthlessness,  Depression Symptoms:  feelings of worthlessness/guilt,  (Hypo) Manic Symptoms:  Irritable Mood,  Anxiety Symptoms:  Excessive Worry,  Psychotic Symptoms:  Hallucinations: None  PTSD Symptoms: Had a traumatic exposure:  "My son committed suicide 10 years ago"  Past Psychiatric History: Diagnosis: Alcohol abuse, alcohol dependency  Hospitalizations: [facility] x 2  Outpatient Care: Dr. Evelene Croon  Substance Abuse Care: "I had been to; [facility], [facility], [facility], [facility] for my alcohol treatments"   Self-Mutilation: Denies  Suicidal Attempts: Denies  Violent Behaviors: Denies   Past Medical History:   Past Medical History  Diagnosis Date  . Alcohol abuse   . Diabetes mellitus   . Hypertension     Allergies:   Allergies  Allergen Reactions  . Other Anaphylaxis    shellfish  . Latex Other (See Comments)    Break outs  . Lipitor (Atorvastatin Calcium)   . Pentazocine Lactate   . Codeine Rash    And nausea   . Sulfonamide Derivatives Rash   PTA Medications: Prescriptions prior to admission  Medication Sig Dispense Refill  . aspirin 81 MG chewable tablet Chew 1 tablet (81 mg total) by mouth daily.  30 tablet  0  . citalopram (CELEXA) 10 MG tablet Take 10 mg by mouth daily.      . diphenoxylate-atropine (LOMOTIL)  2.5-0.025 MG per tablet Take 2 tablets by mouth 2 (two) times daily as needed. diarrhea       . ezetimibe (ZETIA) 10 MG tablet Take 10 mg by mouth daily.        Marland Kitchen lisinopril (PRINIVIL,ZESTRIL) 5 MG tablet Take 5 mg by mouth daily.        . metFORMIN (GLUCOPHAGE-XR) 750 MG 24 hr tablet Take 1 tablet (750 mg total) by mouth 2 (two) times daily.  750 tablet  3  . nebivolol (BYSTOLIC) 5 MG tablet Take 5 mg by mouth daily.        . Omega-3 Fatty Acids (FISH OIL) 1000 MG CPDR Take 4 capsules by mouth at bedtime. For a total of 4000mg  daily            Substance Abuse History in the last 12 months: Substance Age of 1st Use Last Use Amount Specific Type  Nicotine      Alcohol 20 Prior to hosp 4 boxes of the mini bottles of wine Wine  Cannabis Denies use     Opiates Denies use     Cocaine Denies use     Methamphetamines Denies use     LSD Denies use     Ecstasy Denies use     Benzodiazepines Denies use     Caffeine      Inhalants      Others:                         Consequences of Substance Abuse: Medical Consequences:  Liver damage Legal Consequences:  Arrests, loss of driving privileges, jail time Family Consequences:  Family discord  Social History: Current Place of Residence:  [employer] of Birth:  [place]  Family Members: "My husband"  Marital Status:  Married  Children:2, but son deceased.  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  Legal History: None reported  Hobbies/Interests: None reported  Family History:  No family history on file.  Mental Status Examination/Evaluation: Objective:  Appearance: Casual and Fairly Groomed  Eye Contact::  Good  Speech:  Clear and Coherent  Volume:  Normal  Mood:  Depressed  Affect:  Flat  Thought Process:  Coherent  Orientation:  Full  Thought Content:  Rumination  Suicidal Thoughts:  No  Homicidal Thoughts:  No  Memory:  Immediate;   Good Recent;   Good Remote;   Good  Judgement:  Poor  Insight:  Lacking  Psychomotor Activity:  Restlessness  Concentration:  Poor  Recall:  Good  Akathisia:  No  Handed:  Right  AIMS (if indicated):     Assets:  Desire for Improvement  Sleep:  Number of Hours: 6.25     Laboratory/X-Ray: None Psychological  Evaluation(s)      Assessment:    AXIS I:  Alcohol Abuse, Substance Induced Mood Disorder and Alcohol dependency AXIS II:  Deferred AXIS III:   Past Medical History  Diagnosis Date  . Alcohol abuse   . Diabetes mellitus   . Hypertension    AXIS IV:  other psychosocial or environmental problems and Chronic alcoholism AXIS V:  41-50 serious symptoms  Treatment Plan/Recommendations: Admit for safety and stabilizations. Review and reinstate any pertinent home medications for other health issues.  Initiate Glycemic control protocol. Obtain hemoglobin AIC.  Treatment Plan Summary: Daily contact with patient to assess and evaluate symptoms and progress in treatment Medication management  Current Medications:  Current Facility-Administered Medications  Medication Dose Route Frequency Provider Last Rate Last Dose  . acetaminophen (TYLENOL) tablet 650 mg  650 mg Oral Q6H PRN Verne Spurr, PA-C      . alum & mag hydroxide-simeth (MAALOX/MYLANTA) 200-200-20 MG/5ML suspension 30 mL  30 mL Oral Q4H PRN Verne Spurr, PA-C      . aspirin chewable tablet 81 mg  81 mg Oral Daily Verne Spurr, PA-C   81 mg at 05/29/12 0813  . chlordiazePOXIDE (LIBRIUM) capsule 25 mg  25 mg Oral Q6H PRN Verne Spurr, PA-C   25 mg at 05/28/12 2002  . chlordiazePOXIDE (LIBRIUM) capsule 25 mg  25 mg Oral QID Verne Spurr, PA-C   25 mg at 05/29/12 9562   Followed by  . chlordiazePOXIDE (LIBRIUM) capsule 25 mg  25 mg Oral TID Verne Spurr, PA-C       Followed by  . chlordiazePOXIDE (LIBRIUM) capsule 25 mg  25 mg Oral BH-qamhs Verne Spurr, PA-C       Followed by  . chlordiazePOXIDE (LIBRIUM) capsule 25 mg  25 mg Oral Daily Verne Spurr, PA-C      . chlordiazePOXIDE (LIBRIUM) capsule 50 mg  50 mg Oral Once Wonda Cerise, MD   50 mg at 05/29/12 0109  . citalopram (CELEXA) tablet 10 mg  10 mg Oral Daily Verne Spurr, PA-C   10 mg at 05/29/12 0813  . diphenoxylate-atropine (LOMOTIL) 2.5-0.025 MG per tablet 2 tablet   2 tablet Oral Daily Verne Spurr, PA-C   2 tablet at 05/29/12 1308  . ezetimibe (ZETIA) tablet 10 mg  10 mg Oral Daily Verne Spurr, PA-C   10 mg at 05/29/12 6578  . hydrOXYzine (ATARAX/VISTARIL) tablet 25 mg  25 mg Oral Q6H PRN Verne Spurr, PA-C   25 mg at 05/28/12 2001  . lisinopril (PRINIVIL,ZESTRIL) tablet 5 mg  5 mg Oral Daily Verne Spurr, PA-C   5 mg at 05/29/12 4696  . loperamide (IMODIUM) capsule 2-4 mg  2-4 mg Oral PRN Verne Spurr, PA-C      . magnesium hydroxide (MILK OF MAGNESIA) suspension 30 mL  30 mL Oral Daily PRN Verne Spurr, PA-C      . metFORMIN (GLUCOPHAGE-XR) 24 hr tablet 750 mg  750 mg Oral BID Verne Spurr, PA-C   750 mg at 05/29/12 2952  . multivitamin with minerals tablet 1 tablet  1 tablet Oral Daily Verne Spurr, PA-C   1 tablet at 05/29/12 8413  . nebivolol (BYSTOLIC) tablet 5 mg  5 mg Oral Daily Verne Spurr, PA-C   5 mg at 05/29/12 2440  . omega-3 acid ethyl esters (LOVAZA) capsule 2 g  2 g Oral Daily Verne Spurr, PA-C   2 g at 05/29/12 1027  . ondansetron (ZOFRAN-ODT) disintegrating tablet 4 mg  4 mg Oral Q6H PRN Verne Spurr, PA-C      . thiamine (B-1) injection 100 mg  100 mg Intramuscular Once [employer], PA-C      . thiamine (VITAMIN B-1) tablet 100 mg  100 mg Oral Daily Verne Spurr, PA-C   100 mg at 05/29/12 0815   Facility-Administered Medications Ordered in Other Encounters  Medication Dose Route Frequency Provider Last Rate Last Dose  . DISCONTD: cephALEXin (KEFLEX) capsule 500 mg  500 mg Oral Q6H Suzi Roots, MD      .  DISCONTD: folic acid (FOLVITE) tablet 1 mg  1 mg Oral Daily Suzi Roots, MD   1 mg at 05/28/12 1327  . DISCONTD: LORazepam (ATIVAN) tablet 1 mg  1 mg Oral Q6H PRN Suzi Roots, MD   1 mg at 05/28/12 1328  . DISCONTD: multivitamin with minerals tablet 1 tablet  1 tablet Oral Daily Suzi Roots, MD   1 tablet at 05/28/12 1327  . DISCONTD: thiamine (VITAMIN B-1) tablet 100 mg  100 mg Oral Daily Suzi Roots, MD    100 mg at 05/28/12 1327    Observation Level/Precautions:  Q 15 minutes checks for safety  Laboratory:  ETOH levels; 185  Psychotherapy:  Group  Medications: See lists   Routine PRN Medications:  Yes  Consultations: None indicated at this time    Discharge Concerns: Safety and maintaining sobriety     Other:     Armandina Stammer I 6/17/201310:46 AM

## 2012-05-29 NOTE — Progress Notes (Addendum)
D- Patient is attending groups and interacting minimally with peer group.  Has declined meal coverage insulin. Afternoon CBG deferred until 1700 oral coverage.  A- Rates depression and hopelessness at 1. Denies SI. Reports good sleep and appetite. Tremors,diarrhea and chills r/t withdrawal. R- Compliant with scheduled detox protocol. Requested and received prn medications for tooth pain. Will reassess effectiveness. Continue current POC and 15' checks for safety.Reported inulin noncompliance to NP @1200 .

## 2012-05-29 NOTE — H&P (Signed)
Pt seen and evaluated upon admission.  Completed Admission Suicide Risk Assessment.  See orders.  Pt agreeable with plan.  Discussed with team.   

## 2012-05-30 LAB — GLUCOSE, CAPILLARY
Glucose-Capillary: 124 mg/dL — ABNORMAL HIGH (ref 70–99)
Glucose-Capillary: 149 mg/dL — ABNORMAL HIGH (ref 70–99)
Glucose-Capillary: 125 mg/dL — ABNORMAL HIGH (ref 70–99)
Glucose-Capillary: 147 mg/dL — ABNORMAL HIGH (ref 70–99)

## 2012-05-30 LAB — URINE CULTURE
Colony Count: >=100000
Culture  Setup Time: 201306161656

## 2012-05-30 LAB — HEMOGLOBIN A1C
Hgb A1c MFr Bld: 7.1 % — ABNORMAL HIGH (ref <5.7)
Mean Plasma Glucose: 157 mg/dL — ABNORMAL HIGH (ref <117)

## 2012-05-30 MED ORDER — NAPROXEN 250 MG PO TABS
250.0000 mg | ORAL_TABLET | Freq: Two times a day (BID) | ORAL | Status: DC
  Filled 2012-05-30 (×2): qty 1

## 2012-05-30 MED ORDER — NAPROXEN 500 MG PO TABS
250.0000 mg | ORAL_TABLET | Freq: Two times a day (BID) | ORAL | Status: DC
  Administered 2012-05-30: 250 mg via ORAL
  Filled 2012-05-30 (×7): qty 1

## 2012-05-30 NOTE — Progress Notes (Addendum)
[facility] Group Notes:  (Counselor/Nursing/MHT/Case Management/Adjunct)  05/30/2012 3:01 PM  Type of Therapy:  Group Therapy at 11AM  Participation Level:  Active  Participation Quality:  Appropriate, Attentive and Sharing  Affect:  Appropriate  Cognitive:  Alert, Appropriate and Oriented  Insight:  Limited  Engagement in Group:  Good  Engagement in Therapy:  Limited  Modes of Intervention:  Clarification, Socialization and Support  Summary of Progress/Problems: Harmoni chose to  Share her feelings of "determined, peaceful, and annoyed; it is so strange an experience for me to feel annoyed verss flat out , all out anger/rage at husband."  Patient ashared her experience over the weekend with husband rationing wine to patient for four days and her realizing where stash was and plan to raid it before he suggested detox.  I knew the gig was up."  Patient actively involved in listening to others.  Clide Dales 05/30/2012, 3:01 PM

## 2012-05-30 NOTE — Progress Notes (Signed)
D:  Pt has flat facial expression and a sullen affect and mood; pt denies SI/HI and auditory/visual hallucination; pt also denies any hopelessness or depression; pt asking questions about her medications and how they are scheduled A: Pt given emotional support from staff; RN gave education to pt on medications and medication times R: Pt remains appropriate and cooperative; will continue to monitor for safety

## 2012-05-30 NOTE — Progress Notes (Signed)
[facility] MD Progress Note  05/30/2012 1:44 PM  S/O: Patient seen and evaluated. Chart reviewed. Patient stated that her mood was "ok". Her affect was mood congruent and euthymic. She denied any current thoughts of self injurious behavior, suicidal ideation or homicidal ideation. There were no auditory or visual hallucinations, paranoia, delusional thought processes, or mania noted. Thought process was linear and goal directed. No psychomotor agitation or retardation was noted. Speech was normal rate, tone and volume. Eye contact was good. Judgment and insight are limited. Patient has been up and engaged on the unit. No acute safety concerns reported from team. No sig withdrawal s/s noted at this time with use of standard librium taper, pt refusing at times due to increased sedation which is fine if VSS. Reviewed outside meds with pt yesterday and d/c insulin which pt does not take at home and did not want at this time.   Sleep:  Number of Hours: 6.25    Vital Signs:Blood pressure 128/77, pulse 90, temperature 97.9 F (36.6 C), temperature source Oral, resp. rate 20, height 5\' 2"  (1.575 m).  Current Medications:   . aspirin  81 mg Oral Daily  . chlordiazePOXIDE  25 mg Oral QID   Followed by  . chlordiazePOXIDE  25 mg Oral TID   Followed by  . chlordiazePOXIDE  25 mg Oral BH-qamhs   Followed by  . chlordiazePOXIDE  25 mg Oral Daily  . citalopram  10 mg Oral Daily  . diphenoxylate-atropine  2 tablet Oral Daily  . ezetimibe  10 mg Oral Daily  . lisinopril  5 mg Oral Daily  . metFORMIN  750 mg Oral BID  . multivitamin with minerals  1 tablet Oral Daily  . nebivolol  5 mg Oral Daily  . omega-3 acid ethyl esters  2 g Oral Daily  . thiamine  100 mg Intramuscular Once  . thiamine  100 mg Oral Daily  . DISCONTD: insulin aspart  0-15 Units Subcutaneous TID WC  . DISCONTD: insulin aspart  4 Units Subcutaneous TID WC  . DISCONTD: nebivolol  5 mg Oral Daily    Lab Results:  Results for orders placed  during the hospital encounter of 05/28/12 (from the past 48 hour(s))  GLUCOSE, CAPILLARY     Status: Abnormal   Collection Time   05/28/12  5:11 PM      Component Value Range Comment   Glucose-Capillary 137 (*) 70 - 99 mg/dL   GLUCOSE, CAPILLARY     Status: Abnormal   Collection Time   05/29/12  5:59 AM      Component Value Range Comment   Glucose-Capillary 134 (*) 70 - 99 mg/dL   GLUCOSE, CAPILLARY     Status: Abnormal   Collection Time   05/29/12  5:17 PM      Component Value Range Comment   Glucose-Capillary 127 (*) 70 - 99 mg/dL    Comment 1 Notify RN     HEMOGLOBIN A1C     Status: Abnormal   Collection Time   05/29/12  7:49 PM      Component Value Range Comment   Hemoglobin A1C 7.1 (*) <5.7 %    Mean Plasma Glucose 157 (*) <117 mg/dL   GLUCOSE, CAPILLARY     Status: Abnormal   Collection Time   05/29/12  9:17 PM      Component Value Range Comment   Glucose-Capillary 124 (*) 70 - 99 mg/dL    Comment 1 Notify RN  Comment 2 Documented in Chart     GLUCOSE, CAPILLARY     Status: Abnormal   Collection Time   05/30/12  7:03 AM      Component Value Range Comment   Glucose-Capillary 149 (*) 70 - 99 mg/dL   GLUCOSE, CAPILLARY     Status: Abnormal   Collection Time   05/30/12 12:06 PM      Component Value Range Comment   Glucose-Capillary 125 (*) 70 - 99 mg/dL    Comment 1 Notify RN       Physical Findings: CIWA:  CIWA-Ar Total: 1   A/P: Alcohol Dependence & W/D; Polysubstance Dependence, in remission (Cocaine, Cannabis & Benzodiazepines); SIMD   Pt seen and evaluated in treatment team.  Reviewed short term and long term goals, medications, current treatment in the hospital and acute/chronic safety.  Pt denied any current thoughts of self harm, suicidal ideation or homicidal ideation.  Contracted for safety on the unit.  No acute issues noted.  VS reviewed with team.  Pt agreeable with treatment plan, see orders. Continue current medications as noted above with switch in  tylenol to Naprosyn for pain.  Discharge planned for Thursday.  [name] 05/30/2012, 1:44 PM

## 2012-05-30 NOTE — Treatment Plan (Signed)
Interdisciplinary Treatment Plan Update (Adult)  Date: 05/30/2012  Time Reviewed: 10:58 AM   Progress in Treatment: Attending groups: Yes Participating in groups: Yes Taking medication as prescribed: Yes Tolerating medication: Yes   Family/Significant other contact made:   Patient understands diagnosis:  Yes  As evidenced by asking for help with detox Discussing patient identified problems/goals with staff:  Yes  See below Medical problems stabilized or resolved:  Yes Denies suicidal/homicidal ideation: Yes  In tx team Issues/concerns per patient self-inventory:  Yes  Pain is a 5 Other:  New problem(s) identified: N/A  Reason for Continuation of Hospitalization:   Medication stabilization Withdrawal symptoms  Interventions implemented related to continuation of hospitalization: Librium taper,  Encourage group attendance and paticipation  Additional comments:  Estimated length of stay: 1-2 days  Discharge Plan: Return home, see below  New goal(s): N/A  Review of initial/current patient goals per problem list:   1.  Goal(s): Safely detox from alcohol  Met:  No  Target date:6/19  As evidenced by: no withdrawal symptoms, stable vitals  2.  Goal (s): Identify comprehensive sobriety plan  Met:  Yes  Target date:6/18  As evidenced by: Jill Side states she will attend regular AA mtgs  3.  Goal(s): Set up follow up with Dr  Met:  No  Target date:6/19  As evidenced [id number] of appointment  4.  Goal(s):  Met:  Yes  Target date:  As evidenced by:  Attendees: Patient: [name]  05/30/2012 10:58 AM  Family:     Physician:  Lupe Carney 05/30/2012 10:58 AM   Nursing: Roswell Miners   05/30/2012 10:58 AM   Case Manager:  Richelle Ito, LCSW 05/30/2012 10:58 AM   Counselor:  [name], LCSWA 05/30/2012 10:58 AM   Other:     Other:     Other:     Other:      Scribe for Treatment Team:   Ida Rogue, 05/30/2012 10:58 AM

## 2012-05-30 NOTE — Progress Notes (Addendum)
Patient ID: [name], female   DOB: 04-25-1946, 66 y.o.   MRN: [id number] D: Patient pleasant and cooperative tonight. Patient excited about discharge tomorrow however she is worried about her husband who was sick when he came to visit for dinner today.Patient denies SI/HI and denies AVH.  Patient stated that she had to detox so she can care for her grandchildren who will be visiting her in the near future.  Patient smiled when talking about her grandchildren. A:  Patient received Librium for mild withdrawal symptoms with relief.  She also received hydroxyzine for anxiety after she could not sleep after she received a disturbing phone call about her daughter who lives in another state but she states she will be ok.  R: Patient offered encouragement and support.  Patient hydroxyzine effective.  Patient asleep no apparent distress.  Staff to monitor Q15 min for safety.

## 2012-05-30 NOTE — Progress Notes (Signed)
Pt has been calm and cooperative this evening. Pt denies any withdrawal symptoms. Pt does report some drowsiness with medications including her scheduled librium. Pt cbg was 124 tonight. Pt wasn't aware that her cbg monitoring was qid. Pt informed of the change by this writer when she questioned the tech taking her cbg. Pt believes that her stable cbgs and no insulin coverage calls for less cbg monitoring. Pt encouraged to discuss order with her physician in the morning.  Pt denies any SI at this time. Continued support and availability as needed has been extended to this pt. Pt remains safe with q18min checks.

## 2012-05-30 NOTE — Progress Notes (Signed)
[facility] In[facility] Family/Significant Other Collateral Contact  Collateral Contact: with Lanyah Spengler, patient's husband at [phone] has been identified by the patient as the family member/significant other who can provide for collateral information. With written consent from the patient, the family member/significant other has been contacted and reports:   That he has only spoken with patient once since admit "when she called to tell me what to do"  Mr Comes feels patient is going through detox in order to be able to care for grandchildren while they are in [facility] but feels patient will again relapse once they leave based on his previous experience with patient.   Clinical research associate provided phone number and   [facility] telephone number; service explained.   Husband does report patient has threatened suicide but only when under the influence.   Although the patient did not report any suicidal ideation the writer did ask about firearms and medications in the home.  Husband reports there are no firearms and no oversupply of medications in the home including Tylenol PM.  Clide Dales 05/30/2012 5:31 PM

## 2012-05-31 DIAGNOSIS — F1994 Other psychoactive substance use, unspecified with psychoactive substance-induced mood disorder: Secondary | ICD-10-CM

## 2012-05-31 DIAGNOSIS — F411 Generalized anxiety disorder: Secondary | ICD-10-CM

## 2012-05-31 DIAGNOSIS — F1020 Alcohol dependence, uncomplicated: Secondary | ICD-10-CM

## 2012-05-31 LAB — GLUCOSE, CAPILLARY
Glucose-Capillary: 113 mg/dL — ABNORMAL HIGH (ref 70–99)
Glucose-Capillary: 139 mg/dL — ABNORMAL HIGH (ref 70–99)

## 2012-05-31 MED ORDER — CITALOPRAM HYDROBROMIDE 10 MG PO TABS: 10.0000 mg | ORAL_TABLET | Freq: Every day | ORAL | Status: DC

## 2012-05-31 MED ORDER — METFORMIN HCL ER 750 MG PO TB24: 750.0000 mg | ORAL_TABLET | Freq: Two times a day (BID) | ORAL | Status: DC

## 2012-05-31 MED ORDER — FISH OIL 1000 MG PO CPDR: 4.0000 | DELAYED_RELEASE_CAPSULE | Freq: Every day | ORAL | Status: DC

## 2012-05-31 MED ORDER — LISINOPRIL 5 MG PO TABS: 5.0000 mg | ORAL_TABLET | Freq: Every day | ORAL | Status: DC

## 2012-05-31 MED ORDER — NEBIVOLOL HCL 5 MG PO TABS: 5.0000 mg | ORAL_TABLET | Freq: Every day | ORAL | Status: DC

## 2012-05-31 MED ORDER — THIAMINE HCL 100 MG PO TABS: 100.0000 mg | ORAL_TABLET | Freq: Every day | ORAL | Status: DC

## 2012-05-31 MED ORDER — ASPIRIN 81 MG PO CHEW: 81.0000 mg | CHEWABLE_TABLET | Freq: Every day | ORAL | Status: DC

## 2012-05-31 MED ORDER — EZETIMIBE 10 MG PO TABS: 10.0000 mg | ORAL_TABLET | Freq: Every day | ORAL | Status: DC

## 2012-05-31 NOTE — Progress Notes (Signed)
[facility] MD Progress Note  05/31/2012 2:08 PM  S/O: Patient seen and evaluated. Pt has decided that she wishes to leave today.  She has had stents of sobriety up to 6 years at a time and every relapse has been when she was not active in [place]. She now reports that she has learned that she has a choice.  This is new to her.  Reviewed with her the risks to her health with her uncontrolled diabetes and her uncontrolled blood pressure. Insight is quite limited.  Sleep:  Number of Hours: 5.75    Vital Signs:Blood pressure 116/77, pulse 91, temperature 97.8 F (36.6 C), temperature source Oral, resp. rate 18, height 5\' 2"  (1.575 m).  Current Medications:    . aspirin  81 mg Oral Daily  . chlordiazePOXIDE  25 mg Oral TID   Followed by  . chlordiazePOXIDE  25 mg Oral BH-qamhs   Followed by  . chlordiazePOXIDE  25 mg Oral Daily  . citalopram  10 mg Oral Daily  . diphenoxylate-atropine  2 tablet Oral Daily  . ezetimibe  10 mg Oral Daily  . lisinopril  5 mg Oral Daily  . metFORMIN  750 mg Oral BID  . multivitamin with minerals  1 tablet Oral Daily  . naproxen  250 mg Oral BID WC  . nebivolol  5 mg Oral Daily  . omega-3 acid ethyl esters  2 g Oral Daily  . thiamine  100 mg Intramuscular Once  . thiamine  100 mg Oral Daily  . DISCONTD: naproxen  250 mg Oral BID WC    Lab Results:  Results for orders placed during the hospital encounter of 05/28/12 (from the past 48 hour(s))  GLUCOSE, CAPILLARY     Status: Abnormal   Collection Time   05/29/12  5:17 PM      Component Value Range Comment   Glucose-Capillary 127 (*) 70 - 99 mg/dL    Comment 1 Notify RN     HEMOGLOBIN A1C     Status: Abnormal   Collection Time   05/29/12  7:49 PM      Component Value Range Comment   Hemoglobin A1C 7.1 (*) <5.7 %    Mean Plasma Glucose 157 (*) <117 mg/dL   GLUCOSE, CAPILLARY     Status: Abnormal   Collection Time   05/29/12  9:17 PM      Component Value Range Comment   Glucose-Capillary 124 (*) 70 - 99 mg/dL      Comment 1 Notify RN      Comment 2 Documented in Chart     GLUCOSE, CAPILLARY     Status: Abnormal   Collection Time   05/30/12  7:03 AM      Component Value Range Comment   Glucose-Capillary 149 (*) 70 - 99 mg/dL   GLUCOSE, CAPILLARY     Status: Abnormal   Collection Time   05/30/12 12:06 PM      Component Value Range Comment   Glucose-Capillary 125 (*) 70 - 99 mg/dL    Comment 1 Notify RN     GLUCOSE, CAPILLARY     Status: Abnormal   Collection Time   05/30/12  4:24 PM      Component Value Range Comment   Glucose-Capillary 147 (*) 70 - 99 mg/dL   GLUCOSE, CAPILLARY     Status: Abnormal   Collection Time   05/30/12  9:19 PM      Component Value Range Comment   Glucose-Capillary 113 (*) 70 -  99 mg/dL    Comment 1 Notify RN      Comment 2 Documented in Chart     GLUCOSE, CAPILLARY     Status: Abnormal   Collection Time   05/31/12  6:02 AM      Component Value Range Comment   Glucose-Capillary 139 (*) 70 - 99 mg/dL     Physical Findings: CIWA:  CIWA-Ar Total: 1   A/P: Alcohol Dependence & W/D; Polysubstance Dependence, in remission (Cocaine, Cannabis & Benzodiazepines); SIMD   Patient adamantly denies suicidal, homicidal ideations, auditory, visual hallucinations and or delusional thinking.  [name] 05/31/2012, 2:08 PM

## 2012-05-31 NOTE — Discharge Instructions (Signed)
Attend 90 meetings in 90 days. Get trusted sponsor from the advise of others or from whomever in meetings seems to make sense, has a proven track record, will hold you responsible for your sobriety, and both expects and insists on total abstinence.  Work the steps HONESTLY with the trusted sponsor. Get obsessed with your recovery by often reminding yourself of how DEADLY this dredged horrible disease of addiction JUST IS. Focus the first month on speaker meetings where you specifically look at how your life has been wrecked by drugs/alcohol and how your life has been similar to that of the speakers.  You might be a double winner and consider also attending, getting a sponsor and working those steps too. Strongly consider attending at least 6 [employer] Meetings to help you learn about how your helping others to the exclusion of helping yourself is actually hurting yourself and is actually an addiction to fixing others and that you need to work the 12 Step to Happiness through the [employer]. Al-An[employer] Family Groups could be helpful with how to deal with substance abusing family and friends. Or your own issues of being in victim role.  There are only 40 [employer] Family Group meetings a week here in [place].  Online are current listing of those meetings @ [email or web address]  There are [employer].  Search on line and there you can learn the format and can access the schedule for yourself.  They ask you to temproarily block call waiting by starting with *70 then their number is [phone]

## 2012-05-31 NOTE — [facility] Suicide Risk Assessment (Signed)
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 Alcohol abuse   . Diabetes mellitus   . Hypertension    AXIS IV:  other psychosocial or environmental problems and problems related to social environment AXIS V:  51-60 moderate symptoms  Cognitive Features That Contribute To Risk:  Closed-mindedness Thought constriction (tunnel vision)    Suicide Risk:  Minimal: No identifiable suicidal ideation.  Patients presenting with no risk factors but with morbid ruminations; may be classified as minimal risk based on the severity of the depressive symptoms  S/O: Patient seen and evaluated. Pt has decided that she wishes to leave today.  She has had stents of sobriety up to 6 years at a time and every relapse has been when she was not active in [place]. She now reports that she has learned that she has a choice.  This is new to her.  Reviewed with her the risks to her health with her uncontrolled diabetes and her uncontrolled blood pressure. Insight is quite limited.  Sleep:  Number of Hours: 5.75    Vital Signs:Blood pressure 116/77, pulse 91, temperature 97.8 F (36.6 C), temperature source Oral, resp. rate 18, height 5\' 2"  (1.575 m).  Current Medications:    . aspirin  81 mg Oral Daily  . chlordiazePOXIDE  25 mg Oral TID   Followed by  . chlordiazePOXIDE  25 mg Oral BH-qamhs   Followed by  .  chlordiazePOXIDE  25 mg Oral Daily  . citalopram  10 mg Oral Daily  . diphenoxylate-atropine  2 tablet Oral Daily  . ezetimibe  10 mg Oral Daily  . lisinopril  5 mg Oral Daily  . metFORMIN  750 mg Oral BID  . multivitamin with minerals  1 tablet Oral Daily  . naproxen  250 mg Oral BID WC  . nebivolol  5 mg Oral Daily  . omega-3 acid ethyl esters  2 g Oral Daily  . thiamine  100 mg Intramuscular Once  . thiamine  100 mg Oral Daily  . DISCONTD: naproxen  250 mg Oral BID WC    Lab Results:  Results for orders placed during the hospital encounter of 05/28/12 (from the past 48 hour(s))  GLUCOSE, CAPILLARY     Status: Abnormal   Collection Time   05/29/12  5:17 PM      Component Value Range Comment   Glucose-Capillary 127 (*) 70 - 99 mg/dL    Comment 1 Notify RN     HEMOGLOBIN A1C     Status: Abnormal   Collection Time   05/29/12  7:49 PM      Component Value Range Comment   Hemoglobin A1C 7.1 (*) <5.7 %    Mean Plasma Glucose 157 (*) <117 mg/dL   GLUCOSE, CAPILLARY     Status: Abnormal   Collection Time   05/29/12  9:17 PM      Component Value  Range Comment   Glucose-Capillary 124 (*) 70 - 99 mg/dL    Comment 1 Notify RN      Comment 2 Documented in Chart     GLUCOSE, CAPILLARY     Status: Abnormal   Collection Time   05/30/12  7:03 AM      Component Value Range Comment   Glucose-Capillary 149 (*) 70 - 99 mg/dL   GLUCOSE, CAPILLARY     Status: Abnormal   Collection Time   05/30/12 12:06 PM      Component Value Range Comment   Glucose-Capillary 125 (*) 70 - 99 mg/dL    Comment 1 Notify RN     GLUCOSE, CAPILLARY     Status: Abnormal   Collection Time   05/30/12  4:24 PM      Component Value Range Comment   Glucose-Capillary 147 (*) 70 - 99 mg/dL   GLUCOSE, CAPILLARY     Status: Abnormal   Collection Time   05/30/12  9:19 PM      Component Value Range Comment   Glucose-Capillary 113 (*) 70 - 99 mg/dL    Comment 1 Notify RN      Comment 2 Documented in Chart     GLUCOSE,  CAPILLARY     Status: Abnormal   Collection Time   05/31/12  6:02 AM      Component Value Range Comment   Glucose-Capillary 139 (*) 70 - 99 mg/dL     Physical Findings: CIWA:  CIWA-Ar Total: 1   A/P: Alcohol Dependence & W/D; Polysubstance Dependence, in remission (Cocaine, Cannabis & Benzodiazepines); SIMD   Patient adamantly denies suicidal, homicidal ideations, auditory, visual hallucinations and or delusional thinking. Results for orders placed during the hospital encounter of 05/28/12 (from the past 72 hour(s))  GLUCOSE, CAPILLARY     Status: Abnormal   Collection Time   05/28/12  5:11 PM      Component Value Range Comment   Glucose-Capillary 137 (*) 70 - 99 mg/dL   GLUCOSE, CAPILLARY     Status: Abnormal   Collection Time   05/29/12  5:59 AM      Component Value Range Comment   Glucose-Capillary 134 (*) 70 - 99 mg/dL   GLUCOSE, CAPILLARY     Status: Abnormal   Collection Time   05/29/12  5:17 PM      Component Value Range Comment   Glucose-Capillary 127 (*) 70 - 99 mg/dL    Comment 1 Notify RN     HEMOGLOBIN A1C     Status: Abnormal   Collection Time   05/29/12  7:49 PM      Component Value Range Comment   Hemoglobin A1C 7.1 (*) <5.7 %    Mean Plasma Glucose 157 (*) <117 mg/dL   GLUCOSE, CAPILLARY     Status: Abnormal   Collection Time   05/29/12  9:17 PM      Component Value Range Comment   Glucose-Capillary 124 (*) 70 - 99 mg/dL    Comment 1 Notify RN      Comment 2 Documented in Chart     GLUCOSE, CAPILLARY     Status: Abnormal   Collection Time   05/30/12  7:03 AM      Component Value Range Comment   Glucose-Capillary 149 (*) 70 - 99 mg/dL   GLUCOSE, CAPILLARY     Status: Abnormal   Collection Time   05/30/12 12:06 PM      Component Value Range Comment   Glucose-Capillary  125 (*) 70 - 99 mg/dL    Comment 1 Notify RN     GLUCOSE, CAPILLARY     Status: Abnormal   Collection Time   05/30/12  4:24 PM      Component Value Range Comment   Glucose-Capillary 147 (*)  70 - 99 mg/dL   GLUCOSE, CAPILLARY     Status: Abnormal   Collection Time   05/30/12  9:19 PM      Component Value Range Comment   Glucose-Capillary 113 (*) 70 - 99 mg/dL    Comment 1 Notify RN      Comment 2 Documented in Chart     GLUCOSE, CAPILLARY     Status: Abnormal   Collection Time   05/31/12  6:02 AM      Component Value Range Comment   Glucose-Capillary 139 (*) 70 - 99 mg/dL     RISK REDUCTION FACTORS: What pt has learned from hospital stay is that she has a choice.  Risk of self harm is elevated by her mood disorder and her addictions, but she has her family and a fuller sober life.  Risk of harm to others is minimal in that she has not been involved in fights or had any legal charges filed on her.  PLAN: Discharge home Continue Medication List  As of 05/31/2012  2:19 PM   ASK your doctor about these medications      Indication    aspirin 81 MG chewable tablet   Chew 1 tablet (81 mg total) by mouth daily.       citalopram 10 MG tablet   Commonly known as: CELEXA   Take 10 mg by mouth daily.       diphenoxylate-atropine 2.5-0.025 MG per tablet   Commonly known as: LOMOTIL   Take 2 tablets by mouth 2 (two) times daily as needed. diarrhea       ezetimibe 10 MG tablet   Commonly known as: ZETIA   Take 10 mg by mouth daily.       Fish Oil 1000 MG Cpdr   Take 4 capsules by mouth at bedtime. For a total of 4000mg  daily       lisinopril 5 MG tablet   Commonly known as: PRINIVIL,ZESTRIL   Take 5 mg by mouth daily.       metFORMIN 750 MG 24 hr tablet   Commonly known as: GLUCOPHAGE-XR   Take 1 tablet (750 mg total) by mouth 2 (two) times daily.       nebivolol 5 MG tablet   Commonly known as: BYSTOLIC   Take 5 mg by mouth daily.            Follow-up recommendations:  Activities: Resume typical activities Diet: Resume typical diet Other: Follow up with outpatient provider and report any side effects to out patient prescriber.  [name] 05/31/2012,  2:17 PM

## 2012-05-31 NOTE — Progress Notes (Signed)
[facility] Group Notes:  (Counselor/Nursing/MHT/Case Management/Adjunct)  05/31/2012 3:03 PM  Type of Therapy:  Group Therapy at 11  Participation Level:  Active  Participation Quality:  Appropriate  Affect:  Appropriate  Cognitive:  Alert and Appropriate  Insight:  Good  Engagement in Group:  Good  Engagement in Therapy:  Good  Modes of Intervention:  Clarification, Support and Confrontation  Summary of Progress/Problems: [name] shared that she often uses her emotions and reactions as reasons to use alcohol and to keep people at a distance so they will not talk about her alcohol use. [name] shared that her first order of business in going home today will be going through the house and showing her husband where she has hidden alcohol getting it out of the house.  Patient was given support for her honesty; yet also challenged as to what she will do once the grandchildren go home after summer vacation with her. "The pattern has been that I usually drink again" "I don't know what will be different this time if anything; I did talk to the people from the AA meeting last night and found out about halfway houses. Perhaps I'll go to a halfway house where I can stay sober, be close to meetings and reevaluate marriage." Patient was honest and open about inability to control her alcohol use, legal problems and relationships affected by her alcohol use. Clide Dales 05/31/2012, 3:03 PM

## 2012-05-31 NOTE — Progress Notes (Signed)
D: Pt has appropriate mood and affect; pt denies SI/HI and states that she is "excited to be going home today"  A: Pt given emotional support from staff is encouraged to tell staff her questions and concerns; pt's medication routine continued R: Pt remains appropriate and cooperative with staff; will continue to monitor for safety

## 2012-05-31 NOTE — Progress Notes (Signed)
Pt discharged per MD order; pt denies SI/HI or auditory/visual hallucinations at this time; pt given education regarding follow-up appointment and medications; pt escorted by RN to retrieve belongings from locker; pt had no questions or concerns upon discharge

## 2012-06-09 NOTE — Discharge Summary (Signed)
Physician Discharge Summary Note  Patient:  [name] is an 66 y.o., female MRN:  [id number] DOB:  24-Oct-1946 Patient phone:  [phone] (home)  Patient address:   [address] [place] [address],   Date of Admission:  05/28/2012 Date of Discharge: 05/31/12  Reason for Admission: Alcohol detoxification  Discharge Diagnoses: Principal Problem:  *Alcohol abuse, continuous Active Problems:  Psychoactive substance-induced organic mood disorder  Alcohol dependence with physiological dependence   Axis Diagnosis:   AXIS I:  Alcohol dependence with physiological dependence, psychoactive substance-induced organic mood disorder, Alcohol abuse, contnuous. AXIS II:  Deferred AXIS III:   Past Medical History  Diagnosis Date  . Alcohol abuse   . Diabetes mellitus   . Hypertension    AXIS IV:  other psychosocial or environmental problems and Substance abuse/dependence AXIS V:  65  Level of Care:  OP  Hospital Course: This is a 66 year old [name], admitted to [facility] from the [facility] ED with complaints of Excessive alcohol drinking and a request for detoxification. Patient reports, "I am an alcoholic. I started drinking excessively again 2 weeks ago. It got worse and excalated x 1 week. I know I could not stop drinking on my own. I have been in and out of AA meetings x 30 years. I have been in this hospital for detoxification x 2 in the past. Part of my reason for needing detoxification again is because, I will be getting my 2 grand-daughters this summer. I will be keeping them for the rest of the summer break. They are coming from [place]. There is no possible way that I can take care of them while intoxicated at the same time. I drink 4 boxes of the mini bottles of wine daily. Alcoholism is not the only problems that I am dealing with at home. I am not here to elaborate my problems that I have going on at my home, rather to get the help I need to stop  drinking alcohol.Some changes need to be made in my home. I drink to cope. Drinking alcohol also makes me high, then I will forget my pains emotionally. I use it to cope with my health problems also. I have diabetes. I have been in out of the hospital because of it. I have been to several other treatment centers for alcohol treatment.  Upon admission, [name] was started on Librium protocol for alcohol detoxification. She was also enrolled in group counseling sessions and activities. [name] also attended the AA/NA meetings being offered and held on this unit. She received medication management and monitoring for her other medical issues and conditions. Patient tolerated her treatment regimen without any significant adverse effects and or reactions.  She attended treatment team meeting this am and met with her treatment team members. [name] endorsed that she is doing well and stabilized to be discharged. She is aware to continue AA meetings being held within her community. She is instructed and encouraged to attend 90 meetings in 90 days. Get trusted sponsor from the advise of others or from whomever in meetings seems to make sense, or has a proven track record, and who will hold you responsible for your sobriety, and both expects and insists on total abstinence.  Work the steps HONESTLY with the trusted sponsor. Get obsessed with your recovery by often reminding yourself of how DEADLY this dredged horrible disease of addiction JUST IS.Focus the first month on speaker meetings where you specifically look  at how your life has been wrecked by drugs/alcohol and how your life has been similar to that of the speakers.   Upon discharge, patient adamantly denies suicidal, homicidal ideations, auditory, visual hallucinations, delusional thinking and or withdrawal symptoms. She will follow-up on outpatient basis with Dr. Sherwood Gambler at the [facility] on 06/12/12 at 2:30 pm. She [facility] in no  apparent distress via family transport.   Consults:  None  Significant Diagnostic Studies:  labs: CBC with diff, CMP, Toxicology  Discharge Vitals:   Blood pressure 116/77, pulse 91, temperature 97.8 F (36.6 C), temperature source Oral, resp. rate 18, height 5\' 2"  (1.575 m).  Mental Status Exam: See Mental Status Examination and Suicide Risk Assessment completed by Attending Physician prior to discharge.  Discharge destination:  Home  Is patient on multiple antipsychotic therapies at discharge:  No   Has Patient had three or more failed trials of antipsychotic monotherapy by history:  No  Recommended Plan for Multiple Antipsychotic Therapies: NA   Medication List  As of 06/11/2012 11:54 AM   TAKE these medications      Indication    aspirin 81 MG chewable tablet   Chew 1 tablet (81 mg total) by mouth daily. For prevention of stroke and heart attack       citalopram 10 MG tablet   Commonly known as: CELEXA   Take 1 tablet (10 mg total) by mouth daily. For depression.       diphenoxylate-atropine 2.5-0.025 MG per tablet   Commonly known as: LOMOTIL   Take 2 tablets by mouth 2 (two) times daily as needed. diarrhea       ezetimibe 10 MG tablet   Commonly known as: ZETIA   Take 1 tablet (10 mg total) by mouth daily. To help lower cholesterol.       Fish Oil 1000 MG Cpdr   Take 4 capsules by mouth at bedtime. For a total of 4000mg  daily for brain rehab, cholesterol control, dry eyes and several other benefits.       lisinopril 5 MG tablet   Commonly known as: PRINIVIL,ZESTRIL   Take 1 tablet (5 mg total) by mouth daily. For control of high blood pressure       metFORMIN 750 MG 24 hr tablet   Commonly known as: GLUCOPHAGE-XR   Take 1 tablet (750 mg total) by mouth 2 (two) times daily. For control of blood sugar       nebivolol 5 MG tablet   Commonly known as: BYSTOLIC   Take 1 tablet (5 mg total) by mouth daily. For control of high blood pressure       thiamine 100 MG  tablet   Take 1 tablet (100 mg total) by mouth daily. For nutritional supplementation.            Follow-up Information    Follow up with [facility] on 06/12/2012. (2:30 with Dr Sherwood Gambler)    Contact information:   [phone]  [address]  [place]         Follow-up recommendations:  Activity:  as tolerated Other:  Keep all scheduled follow-up appointments as recommended.    Comments:   Take all your medications as prescribed by your mental healthcare provider. Report any adverse effects and or reactions from your medicines to your outpatient provider promptly. Patient is instructed and cautioned to not engage in alcohol and or illegal drug use while on prescription medicines. In  the event of worsening symptoms, patient is instructed to call the crisis hotline, 911 and or go to the nearest ED for appropriate evaluation and treatment of symptoms. Follow-up with your primary care provider for your other medical issues, concerns and or health care needs.      SignedArmandina Stammer I 06/11/2012, 11:54 AM

## 2012-06-13 NOTE — Discharge Summary (Signed)
I agree with this D/C Summary.  

## 2012-06-20 ENCOUNTER — Encounter (HOSPITAL_COMMUNITY): Payer: Self-pay | Admitting: *Deleted

## 2012-06-20 ENCOUNTER — Emergency Department (HOSPITAL_COMMUNITY)
Admission: EM | Admit: 2012-06-20 | Discharge: 2012-06-21 | Disposition: A | Discharge status: Other Acute Inpatient Hospital | Payer: 59 | Attending: Emergency Medicine | Admitting: Emergency Medicine

## 2012-06-20 ENCOUNTER — Emergency Department (HOSPITAL_COMMUNITY): Payer: 59

## 2012-06-20 DIAGNOSIS — Z79899 Other long term (current) drug therapy: Secondary | ICD-10-CM | POA: Insufficient documentation

## 2012-06-20 DIAGNOSIS — F1010 Alcohol abuse, uncomplicated: Secondary | ICD-10-CM

## 2012-06-20 DIAGNOSIS — E119 Type 2 diabetes mellitus without complications: Secondary | ICD-10-CM | POA: Insufficient documentation

## 2012-06-20 DIAGNOSIS — I10 Essential (primary) hypertension: Secondary | ICD-10-CM | POA: Insufficient documentation

## 2012-06-20 DIAGNOSIS — R000 Tachycardia, unspecified: Secondary | ICD-10-CM | POA: Insufficient documentation

## 2012-06-20 LAB — COMPREHENSIVE METABOLIC PANEL
ALT: 33 U/L (ref 0–35)
AST: 27 U/L (ref 0–37)
Albumin: 4 g/dL (ref 3.5–5.2)
Alkaline Phosphatase: 90 U/L (ref 39–117)
BUN: 15 mg/dL (ref 6–23)
CO2: 21 mEq/L (ref 19–32)
Calcium: 8.8 mg/dL (ref 8.4–10.5)
Chloride: 95 mEq/L — ABNORMAL LOW (ref 96–112)
Creatinine, Ser: 0.54 mg/dL (ref 0.50–1.10)
GFR calc Af Amer: >90 mL/min (ref >90)
GFR calc non Af Amer: >90 mL/min (ref >90)
Glucose, Bld: 166 mg/dL — ABNORMAL HIGH (ref 70–99)
Potassium: 4 mEq/L (ref 3.5–5.1)
Sodium: 136 mEq/L (ref 135–145)
Total Bilirubin: 0.4 mg/dL (ref 0.3–1.2)
Total Protein: 7.7 g/dL (ref 6.0–8.3)

## 2012-06-20 LAB — CBC
HCT: 43.5 % (ref 36.0–46.0)
Hemoglobin: 14.8 g/dL (ref 12.0–15.0)
MCH: 32.5 pg (ref 26.0–34.0)
MCHC: 34 g/dL (ref 30.0–36.0)
MCV: 95.4 fL (ref 78.0–100.0)
Platelets: 331 10*3/uL (ref 150–400)
RBC: 4.56 MIL/uL (ref 3.87–5.11)
RDW: 14.8 % (ref 11.5–15.5)
WBC: 9.3 10*3/uL (ref 4.0–10.5)

## 2012-06-20 LAB — RAPID URINE DRUG SCREEN, HOSP PERFORMED
Amphetamines: NOT DETECTED
Barbiturates: NOT DETECTED
Benzodiazepines: POSITIVE — AB
Cocaine: NOT DETECTED
Opiates: NOT DETECTED
Tetrahydrocannabinol: NOT DETECTED

## 2012-06-20 LAB — ETHANOL: Alcohol, Ethyl (B): 393 mg/dL — ABNORMAL HIGH (ref 0–11)

## 2012-06-20 MED ORDER — LISINOPRIL 5 MG PO TABS
5.0000 mg | ORAL_TABLET | Freq: Every day | ORAL | Status: DC
  Administered 2012-06-20 – 2012-06-21 (×2): 5 mg via ORAL
  Filled 2012-06-20 (×2): qty 1

## 2012-06-20 MED ORDER — LORAZEPAM 1 MG PO TABS: 0.0000 mg | ORAL_TABLET | Freq: Two times a day (BID) | ORAL | Status: DC

## 2012-06-20 MED ORDER — METFORMIN HCL ER 750 MG PO TB24
750.0000 mg | ORAL_TABLET | Freq: Two times a day (BID) | ORAL | Status: DC
  Administered 2012-06-20 – 2012-06-21 (×2): 750 mg via ORAL
  Filled 2012-06-20 (×4): qty 1

## 2012-06-20 MED ORDER — THIAMINE HCL 100 MG/ML IJ SOLN
100.0000 mg | Freq: Every day | INTRAMUSCULAR | Status: DC
  Filled 2012-06-20: qty 2

## 2012-06-20 MED ORDER — NEBIVOLOL HCL 5 MG PO TABS
5.0000 mg | ORAL_TABLET | Freq: Every day | ORAL | Status: DC
  Administered 2012-06-20 – 2012-06-21 (×2): 5 mg via ORAL
  Filled 2012-06-20 (×2): qty 1

## 2012-06-20 MED ORDER — LORAZEPAM 1 MG PO TABS
0.0000 mg | ORAL_TABLET | Freq: Four times a day (QID) | ORAL | Status: DC
  Administered 2012-06-20: 1 mg via ORAL
  Filled 2012-06-20: qty 1

## 2012-06-20 MED ORDER — LORAZEPAM 1 MG PO TABS
1.0000 mg | ORAL_TABLET | Freq: Four times a day (QID) | ORAL | Status: DC | PRN
  Administered 2012-06-21 (×2): 1 mg via ORAL
  Filled 2012-06-20 (×2): qty 1

## 2012-06-20 MED ORDER — ASPIRIN 81 MG PO CHEW
81.0000 mg | CHEWABLE_TABLET | Freq: Every day | ORAL | Status: DC
  Administered 2012-06-20 – 2012-06-21 (×2): 81 mg via ORAL
  Filled 2012-06-20 (×2): qty 1

## 2012-06-20 MED ORDER — VITAMIN B-1 100 MG PO TABS
100.0000 mg | ORAL_TABLET | Freq: Every day | ORAL | Status: DC
  Administered 2012-06-20 – 2012-06-21 (×2): 100 mg via ORAL
  Filled 2012-06-20 (×2): qty 1

## 2012-06-20 MED ORDER — ADULT MULTIVITAMIN W/MINERALS CH
1.0000 | ORAL_TABLET | Freq: Every day | ORAL | Status: DC
  Administered 2012-06-20 – 2012-06-21 (×2): 1 via ORAL
  Filled 2012-06-20 (×2): qty 1

## 2012-06-20 MED ORDER — FOLIC ACID 1 MG PO TABS
1.0000 mg | ORAL_TABLET | Freq: Every day | ORAL | Status: DC
  Administered 2012-06-20 – 2012-06-21 (×2): 1 mg via ORAL
  Filled 2012-06-20 (×2): qty 1

## 2012-06-20 MED ORDER — EZETIMIBE 10 MG PO TABS
10.0000 mg | ORAL_TABLET | Freq: Every day | ORAL | Status: DC
  Administered 2012-06-20 – 2012-06-21 (×2): 10 mg via ORAL
  Filled 2012-06-20 (×2): qty 1

## 2012-06-20 MED ORDER — CITALOPRAM HYDROBROMIDE 10 MG PO TABS
10.0000 mg | ORAL_TABLET | Freq: Every day | ORAL | Status: DC
  Administered 2012-06-20 – 2012-06-21 (×2): 10 mg via ORAL
  Filled 2012-06-20 (×2): qty 1

## 2012-06-20 MED ORDER — LORAZEPAM 2 MG/ML IJ SOLN
1.0000 mg | Freq: Four times a day (QID) | INTRAMUSCULAR | Status: DC | PRN
  Administered 2012-06-20 – 2012-06-21 (×2): 1 mg via INTRAVENOUS
  Filled 2012-06-20 (×2): qty 1

## 2012-06-20 NOTE — ED Notes (Signed)
Pt states she is here for detox from alcohol.  Slurred speech.  Last alcohol intake four hours prior to arrival.  Hr 140 in triage; skin warm and dry

## 2012-06-20 NOTE — ED Notes (Signed)
Pt back from X-ray.  

## 2012-06-20 NOTE — ED Notes (Signed)
Attempted to collect urine from pt. When giving options to accommodate pt.'s voiding needs, the pt. Stated that she will not walk to the bathroom, will not use a bedside commode, and will not use a female urinal. 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 Then said that she might would walk to the bathroom for me in an hour.

## 2012-06-20 NOTE — ED Provider Notes (Signed)
History     CSN: [id number]  Arrival date & time 06/20/12  1949   First MD Initiated Contact with Patient 06/20/12 2023      Chief Complaint  Patient presents with  . Medical Clearance  . Tachycardia    (Consider location/radiation/quality/duration/timing/severity/associated sxs/prior treatment) HPI Comments: Patient requesting detox from alcohol. She has slurred speech and smells of alcohol. She drinks wine chronically. Last intake 4 hours ago. Denies any SI or HI. She is tachycardic to the 140s she denies any history of seizures. She denies any current tremors but feels like she may be getting better.  The history is provided by the patient.    Past Medical History  Diagnosis Date  . Alcohol abuse   . Diabetes mellitus   . Hypertension     Past Surgical History  Procedure Date  . Cesarean section   . Tonsillectomy   . Bunionectomy     No family history on file.  History  Substance Use Topics  . Smoking status: Former Smoker    Quit date: 03/11/2010  . Smokeless tobacco: Never Used  . Alcohol Use: Yes     4-5 bottles of wine per day    OB History    Grav Para Term Preterm Abortions TAB SAB Ect Mult Living                  Review of Systems  Constitutional: Negative for fever, activity change and appetite change.  HENT: Negative for congestion and rhinorrhea.   Respiratory: Negative for cough, chest tightness and shortness of breath.   Gastrointestinal: Negative for nausea, vomiting and abdominal pain.  Genitourinary: Negative for dysuria and hematuria.  Musculoskeletal: Negative for back pain.  Neurological: Negative for seizures and weakness.  Psychiatric/Behavioral: Positive for suicidal ideas, behavioral problems, disturbed wake/sleep cycle and agitation.    Allergies  Other; Latex; Lipitor; Pentazocine lactate; Codeine; and Sulfonamide derivatives  Home Medications   Current Outpatient Rx  Name Route Sig Dispense Refill  . ASPIRIN 81 MG PO  CHEW Oral Chew 1 tablet (81 mg total) by mouth daily. For prevention of stroke and heart attack 30 tablet 0  . CITALOPRAM HYDROBROMIDE 10 MG PO TABS Oral Take 1 tablet (10 mg total) by mouth daily. For depression. 30 tablet 0  . DIPHENOXYLATE-ATROPINE 2.5-0.025 MG PO TABS Oral Take 2 tablets by mouth 2 (two) times daily as needed. diarrhea     . EZETIMIBE 10 MG PO TABS Oral Take 1 tablet (10 mg total) by mouth daily. To help lower cholesterol. 30 tablet 0  . LISINOPRIL 5 MG PO TABS Oral Take 1 tablet (5 mg total) by mouth daily. For control of high blood pressure 30 tablet 0  . METFORMIN HCL ER 750 MG PO TB24 Oral Take 1 tablet (750 mg total) by mouth 2 (two) times daily. For control of blood sugar 60 tablet 0    Resume on WEDNESDAY  . NEBIVOLOL HCL 5 MG PO TABS Oral Take 1 tablet (5 mg total) by mouth daily. For control of high blood pressure 30 tablet 0  . FISH OIL 1000 MG PO CPDR Oral Take 4 capsules by mouth at bedtime. For a total of 4000mg  daily for brain rehab, cholesterol control, dry eyes and several other benefits. 120 capsule 0  . THIAMINE HCL 100 MG PO TABS Oral Take 1 tablet (100 mg total) by mouth daily. For nutritional supplementation. 30 tablet 0    BP 136/78  Pulse 106  Temp 99.1  F (37.3 C) (Oral)  Resp 7  Ht 5\' 3"  (1.6 m)  Wt 206 lb (93.441 kg)  BMI 36.49 kg/m2  SpO2 94%  Physical Exam  Constitutional: She is oriented to person, place, and time. She appears well-developed and well-nourished. No distress.  HENT:  Head: Normocephalic and atraumatic.  Mouth/Throat: Oropharynx is clear and moist. No oropharyngeal exudate.  Eyes: Conjunctivae and EOM are normal. Pupils are equal, round, and reactive to light.  Neck: Normal range of motion. Neck supple.  Cardiovascular: Normal rate, regular rhythm and normal heart sounds.   No murmur heard. Pulmonary/Chest: Effort normal and breath sounds normal. No respiratory distress.  Abdominal: Soft. There is no tenderness. There is  no rebound and no guarding.  Musculoskeletal: Normal range of motion. She exhibits no edema and no tenderness.  Neurological: She is alert and oriented to person, place, and time. No cranial nerve deficit.       No asterixis, no tremors  Skin: Skin is warm.    ED Course  Procedures (including critical care time)  Labs Reviewed  COMPREHENSIVE METABOLIC PANEL - Abnormal; Notable for the following:    Chloride 95 (*)     Glucose, Bld 166 (*)     All other components within normal limits  ETHANOL - Abnormal; Notable for the following:    Alcohol, Ethyl (B) 393 (*)     All other components within normal limits  URINE RAPID DRUG SCREEN (HOSP PERFORMED) - Abnormal; Notable for the following:    Benzodiazepines POSITIVE (*)     All other components within normal limits  CBC  D-DIMER, QUANTITATIVE   Dg Chest 2 View  06/21/2012  *RADIOLOGY REPORT*  Clinical Data: Medical clearance; tachycardia.  CHEST - 2 VIEW  Comparison: Chest radiograph performed 04/19/2009  Findings: The lungs are well-aerated and clear.  There is no evidence of focal opacification, pleural effusion or pneumothorax.  The heart is normal in size; the mediastinal contour is within normal limits.  No acute osseous abnormalities are seen.  IMPRESSION: No acute cardiopulmonary process seen.  Original Report Authenticated By: Tonia Ghent, M.D.     1. Alcohol abuse       MDM  Alcohol abuse requesting detox. Tachycardia without chest pain or SOB.  Anxious and tearful.  Screening labs, Ativan, EKG, attempt to improve heart rate with IV fluids and Ativan.  Heart rate improved to 100s on recheck. Patient complains of feeling tremulous. Alcohol level markedly elevated at 393. Continue CIWA protocol. D-dimer negative, CXR negative.    Date: 06/20/2012  Rate: 126  Rhythm: sinus tachycardia  QRS Axis: normal  Intervals: normal  ST/T Wave abnormalities: normal  Conduction Disutrbances:none  Narrative Interpretation: Rate  faster  Old EKG Reviewed: changes noted    Glynn Octave, MD 06/21/12 0100

## 2012-06-20 NOTE — ED Notes (Signed)
Denies SI/HI

## 2012-06-21 ENCOUNTER — Inpatient Hospital Stay (HOSPITAL_COMMUNITY)
Admission: AD | Admit: 2012-06-21 | Discharge: 2012-06-24 | DRG: 897 | Disposition: A | Discharge status: Home / Self Care | Payer: 59 | Source: Ambulatory Visit | Attending: Psychiatry | Admitting: Psychiatry

## 2012-06-21 ENCOUNTER — Encounter (HOSPITAL_COMMUNITY): Payer: Self-pay | Admitting: *Deleted

## 2012-06-21 DIAGNOSIS — F1010 Alcohol abuse, uncomplicated: Secondary | ICD-10-CM | POA: Diagnosis present

## 2012-06-21 DIAGNOSIS — F10229 Alcohol dependence with intoxication, unspecified: Secondary | ICD-10-CM | POA: Diagnosis present

## 2012-06-21 DIAGNOSIS — I10 Essential (primary) hypertension: Secondary | ICD-10-CM | POA: Diagnosis present

## 2012-06-21 DIAGNOSIS — IMO0001 Reserved for inherently not codable concepts without codable children: Secondary | ICD-10-CM | POA: Diagnosis present

## 2012-06-21 DIAGNOSIS — E119 Type 2 diabetes mellitus without complications: Secondary | ICD-10-CM | POA: Diagnosis present

## 2012-06-21 DIAGNOSIS — Z7982 Long term (current) use of aspirin: Secondary | ICD-10-CM

## 2012-06-21 DIAGNOSIS — F1020 Alcohol dependence, uncomplicated: Secondary | ICD-10-CM

## 2012-06-21 DIAGNOSIS — F10988 Alcohol use, unspecified with other alcohol-induced disorder: Principal | ICD-10-CM | POA: Diagnosis present

## 2012-06-21 DIAGNOSIS — Z79899 Other long term (current) drug therapy: Secondary | ICD-10-CM

## 2012-06-21 HISTORY — DX: Fibromyalgia: M79.7

## 2012-06-21 LAB — D-DIMER, QUANTITATIVE (NOT AT ARMC): D-Dimer, Quant: 0.46 ug/mL-FEU (ref 0.00–0.48)

## 2012-06-21 LAB — GLUCOSE, CAPILLARY: Glucose-Capillary: 109 mg/dL — ABNORMAL HIGH (ref 70–99)

## 2012-06-21 MED ORDER — LOPERAMIDE HCL 2 MG PO CAPS
2.0000 mg | ORAL_CAPSULE | Freq: Once | ORAL | Status: AC
  Administered 2012-06-21: 2 mg via ORAL
  Filled 2012-06-21: qty 1

## 2012-06-21 MED ORDER — CHLORDIAZEPOXIDE HCL 25 MG PO CAPS
25.0000 mg | ORAL_CAPSULE | Freq: Four times a day (QID) | ORAL | Status: AC
  Administered 2012-06-21 – 2012-06-22 (×6): 25 mg via ORAL
  Filled 2012-06-21 (×5): qty 1

## 2012-06-21 MED ORDER — OMEGA-3-ACID ETHYL ESTERS 1 G PO CAPS
1.0000 g | ORAL_CAPSULE | Freq: Every day | ORAL | Status: DC
  Administered 2012-06-21 – 2012-06-24 (×4): 1 g via ORAL
  Filled 2012-06-21 (×8): qty 1

## 2012-06-21 MED ORDER — NEBIVOLOL HCL 5 MG PO TABS
5.0000 mg | ORAL_TABLET | Freq: Every day | ORAL | Status: DC
  Administered 2012-06-22 – 2012-06-24 (×3): 5 mg via ORAL
  Filled 2012-06-21 (×7): qty 1

## 2012-06-21 MED ORDER — VITAMIN B-1 100 MG PO TABS
100.0000 mg | ORAL_TABLET | Freq: Every day | ORAL | Status: DC
  Filled 2012-06-21 (×2): qty 1

## 2012-06-21 MED ORDER — INSULIN ASPART 100 UNIT/ML ~~LOC~~ SOLN: 4.0000 [IU] | Freq: Three times a day (TID) | SUBCUTANEOUS | Status: DC

## 2012-06-21 MED ORDER — METFORMIN HCL ER 750 MG PO TB24
750.0000 mg | ORAL_TABLET | Freq: Two times a day (BID) | ORAL | Status: DC
  Administered 2012-06-21 – 2012-06-24 (×6): 750 mg via ORAL
  Filled 2012-06-21 (×11): qty 1

## 2012-06-21 MED ORDER — LOPERAMIDE HCL 2 MG PO CAPS
2.0000 mg | ORAL_CAPSULE | ORAL | Status: DC | PRN
  Administered 2012-06-22 (×2): 2 mg via ORAL
  Administered 2012-06-22: 4 mg via ORAL
  Filled 2012-06-21 (×2): qty 1
  Filled 2012-06-21: qty 2

## 2012-06-21 MED ORDER — THIAMINE HCL 100 MG/ML IJ SOLN: 100.0000 mg | Freq: Once | INTRAMUSCULAR | Status: DC

## 2012-06-21 MED ORDER — LORAZEPAM 2 MG/ML IJ SOLN
2.0000 mg | Freq: Once | INTRAMUSCULAR | Status: AC
  Administered 2012-06-21: 2 mg via INTRAVENOUS
  Filled 2012-06-21: qty 1

## 2012-06-21 MED ORDER — CHLORDIAZEPOXIDE HCL 25 MG PO CAPS: 25.0000 mg | ORAL_CAPSULE | ORAL | Status: DC

## 2012-06-21 MED ORDER — CHLORDIAZEPOXIDE HCL 25 MG PO CAPS: 25.0000 mg | ORAL_CAPSULE | Freq: Every day | ORAL | Status: DC

## 2012-06-21 MED ORDER — VITAMIN B-1 100 MG PO TABS
100.0000 mg | ORAL_TABLET | Freq: Every day | ORAL | Status: DC
  Administered 2012-06-22 – 2012-06-24 (×3): 100 mg via ORAL
  Filled 2012-06-21 (×5): qty 1

## 2012-06-21 MED ORDER — EZETIMIBE 10 MG PO TABS
10.0000 mg | ORAL_TABLET | Freq: Every day | ORAL | Status: DC
  Administered 2012-06-22 – 2012-06-24 (×3): 10 mg via ORAL
  Filled 2012-06-21 (×7): qty 1

## 2012-06-21 MED ORDER — ASPIRIN 81 MG PO CHEW
81.0000 mg | CHEWABLE_TABLET | Freq: Every day | ORAL | Status: DC
  Administered 2012-06-22 – 2012-06-24 (×3): 81 mg via ORAL
  Filled 2012-06-21 (×7): qty 1

## 2012-06-21 MED ORDER — ONDANSETRON 4 MG PO TBDP
4.0000 mg | ORAL_TABLET | Freq: Four times a day (QID) | ORAL | Status: DC | PRN
  Administered 2012-06-21 – 2012-06-22 (×3): 4 mg via ORAL
  Filled 2012-06-21: qty 1

## 2012-06-21 MED ORDER — CHLORDIAZEPOXIDE HCL 25 MG PO CAPS: 25.0000 mg | ORAL_CAPSULE | Freq: Three times a day (TID) | ORAL | Status: DC

## 2012-06-21 MED ORDER — LISINOPRIL 5 MG PO TABS
5.0000 mg | ORAL_TABLET | Freq: Every day | ORAL | Status: DC
  Administered 2012-06-22 – 2012-06-24 (×3): 5 mg via ORAL
  Filled 2012-06-21 (×7): qty 1

## 2012-06-21 MED ORDER — ADULT MULTIVITAMIN W/MINERALS CH
1.0000 | ORAL_TABLET | Freq: Every day | ORAL | Status: DC
  Administered 2012-06-22 – 2012-06-24 (×3): 1 via ORAL
  Filled 2012-06-21 (×6): qty 1

## 2012-06-21 MED ORDER — CHLORDIAZEPOXIDE HCL 25 MG PO CAPS: 25.0000 mg | ORAL_CAPSULE | Freq: Four times a day (QID) | ORAL | Status: DC | PRN

## 2012-06-21 MED ORDER — DIPHENOXYLATE-ATROPINE 2.5-0.025 MG PO TABS
2.0000 | ORAL_TABLET | Freq: Two times a day (BID) | ORAL | Status: DC | PRN
  Administered 2012-06-21 – 2012-06-24 (×5): 2 via ORAL
  Filled 2012-06-21: qty 1
  Filled 2012-06-21: qty 2
  Filled 2012-06-21: qty 1
  Filled 2012-06-21 (×2): qty 2
  Filled 2012-06-21 (×2): qty 1

## 2012-06-21 MED ORDER — CITALOPRAM HYDROBROMIDE 10 MG PO TABS
10.0000 mg | ORAL_TABLET | Freq: Every day | ORAL | Status: DC
  Administered 2012-06-22 – 2012-06-24 (×3): 10 mg via ORAL
  Filled 2012-06-21 (×7): qty 1

## 2012-06-21 MED ORDER — HYDROXYZINE HCL 25 MG PO TABS
25.0000 mg | ORAL_TABLET | Freq: Four times a day (QID) | ORAL | Status: DC | PRN
  Administered 2012-06-22: 25 mg via ORAL
  Filled 2012-06-21: qty 1

## 2012-06-21 MED ORDER — INSULIN ASPART 100 UNIT/ML ~~LOC~~ SOLN: 0.0000 [IU] | Freq: Three times a day (TID) | SUBCUTANEOUS | Status: DC

## 2012-06-21 NOTE — Progress Notes (Signed)
Patient ID: [name], female   DOB: 22-Apr-1946, 66 y.o.   MRN: [id number] Pt. Is a 66 year old female who has been to [facility] on two other occassions. Her most recent visit was two weeks ago. Pt states she has struggled with alcohol abuse on and off times 16 years. The longest sobreity has been 6 years. States she had dental work a week ago and found that the hydrocodone made her sick so she resorted to alcohol. States on any given day she would drink 4 small bottles. Pt. History includes: Diabetes, diverticulosis, fibromyalgia,HTN, allergies include: sulfa, codeine, latex and lipitor.pt. Arrived at the ED 2 days ago after drinking 4 small bottles of wine and deciding she needed help.

## 2012-06-21 NOTE — ED Notes (Signed)
Pt resting/sleeping in bed. Ativan 1mg  held.

## 2012-06-21 NOTE — ED Notes (Signed)
Pt c/o diarrhea. Given maternity panties and pad per request and pt changed into blue scrubs.

## 2012-06-21 NOTE — ED Notes (Signed)
Pt c/o of diarrhea. MD notified.

## 2012-06-21 NOTE — ED Provider Notes (Addendum)
8:06 AM Patient sleeping on AM rounds.  Per RN, no overnight events or complaints. Patient is voluntary, awaiting placement.   Gerhard Munch, MD 06/21/12 0810  1:48 PM Patient > BH.  Dr. Catha Brow accepts.  Gerhard Munch, MD 06/21/12 1348

## 2012-06-21 NOTE — Progress Notes (Signed)
Pt. Has been in the bed primarily since she has been admitted to the unit. Denies Si or HI and contracts for safety.

## 2012-06-21 NOTE — ED Notes (Signed)
Pt sleeping. NAD noted 

## 2012-06-21 NOTE — ED Notes (Signed)
Pt wanded by security. 

## 2012-06-21 NOTE — ED Notes (Signed)
Earring removed and placed in belongings bag.

## 2012-06-21 NOTE — BH Assessment (Addendum)
Assessment Note   [name] is an 66 y.o. female.   Axis I: Depressive Disorder NOS and Substance Abuse  Axis II: Deferred Axis III:  Past Medical History  Diagnosis Date  . Alcohol abuse   . Diabetes mellitus   . Hypertension    Axis IV: other psychosocial or environmental problems, problems related to social environment, problems with access to health care services and problems with primary support group Axis V: 41-50 serious symptoms   Patient is a 66 year old white female requesting detox from alcohol. Patient has a BAL of 393 and has tested positive for Benzos.  Patient reports that she has been prescribed hydrocodone due to her Fibromyalgia and a tooth pain.  Patient reports that her last intake of alcohol was 4 hours ago when she drank four small bottles of wine.  Patient did not remember the size of the bottles.  Patient did not remember when she last took any pills. Patient denies having a problem with hydrocodone. Patient reports the following withdrawal symptoms shakes,nausea and stomach cramps.   Patient stated she has been in and out of [employer], detox, rehabs and treatment facilities for the past 30 years. I have been in this hospital for detoxification x 2 in the past year.  Patient reports that she has received treatment at [facility] 2, [facility], [place], [facility] [address] and [address]. Patient reports.  Patient was not able to remember the dates of placement.  Patient reports that her longest period of sobriety for 6 years.  Patient reports that she uses alcohol cope with my health problems.  Patient denies any SI/HI.  Patient denies any psychosis.     Past Medical History:  Past Medical History  Diagnosis Date  . Alcohol abuse   . Diabetes mellitus   . Hypertension     Past Surgical History  Procedure Date  . Cesarean section   . Tonsillectomy   . Bunionectomy     Family History: No family history on file.  Social History:  reports that she  quit smoking about 2 years ago. She has never used smokeless tobacco. She reports that she drinks alcohol. She reports that she does not use illicit drugs.  Additional Social History:  Alcohol / Drug Use History of alcohol / drug use?: Yes Substance #1 Name of Substance 1: Alcohol  1 - Age of First Use: 20 1 - Amount (size/oz): varies (3-4) bottles of wine.  1 - Duration: Paitent reports that she has been drinking for over 30years.  1 - Last Use / Amount: 4 small bottles of wine. Today   Substance #2 Name of Substance 2: hydrocodone 2 - Age of First Use: unable to remember  2 - Amount (size/oz): unable to remember  2 - Frequency: unable to remember  2 - Duration: unable to rememeber 2 - Last Use / Amount: Today  Substance #3 Name of Substance 3: Alcohol  3 - Age of First Use: 20 3 - Amount (size/oz): 8 small bottles of wine   3 - Frequency: Daily  3 - Duration: for the past two weeks  3 - Last Use / Amount: 4 bottles of wine. She did not remember the size of the bottle.    CIWA: CIWA-Ar BP: 145/75 mmHg Pulse Rate: 91  Nausea and Vomiting: 2 Tactile Disturbances: very mild itching, pins and needles, burning or numbness Tremor: two Auditory Disturbances: not present Paroxysmal Sweats: two Visual Disturbances: not present Anxiety: mildly anxious Headache,  Fullness in Head: mild Agitation: somewhat more than normal activity Orientation and Clouding of Sensorium: oriented and can do serial additions CIWA-Ar Total: 11  COWS: Clinical Opiate Withdrawal Scale (COWS) Resting Pulse Rate: Pulse Rate 81-100 Sweating: Subjective report of chills or flushing Restlessness: Reports difficulty sitting still, but is able to do so Pupil Size: Pupils possibly larger than normal for room light Bone or Joint Aches: Mild diffuse discomfort Runny Nose or Tearing: Nasal stuffiness or unusually moist eyes GI Upset: nausea or loose stool Tremor: Slight tremor observable Yawning: Yawning three or  more times during assessment Anxiety or Irritability: Patient reports increasing irritability or anxiousness Gooseflesh Skin: Skin is smooth COWS Total Score: 13   Allergies:  Allergies  Allergen Reactions  . Other Anaphylaxis    shellfish  . Latex Other (See Comments)    Break outs  . Lipitor (Atorvastatin Calcium)   . Pentazocine Lactate   . Codeine Rash    And nausea   . Sulfonamide Derivatives Rash    Home Medications:  (Not in a hospital admission)  OB/GYN Status:  No LMP recorded. Patient is postmenopausal.  General Assessment Data Location of Assessment: [facility] Services ACT Assessment: Yes Living Arrangements: Alone Can pt return to current living arrangement?: Yes Admission Status: Voluntary Is patient capable of signing voluntary admission?: Yes Transfer from: Home Referral Source: Self/Family/Friend     Risk to self Suicidal Ideation: No Suicidal Intent: No Is patient at risk for suicide?: No Suicidal Plan?: No Access to Means: No What has been your use of drugs/alcohol within the last 12 months?: alcohol, hydrocodone  Previous Attempts/Gestures: No How many times?: 0  Other Self Harm Risks: 0 Triggers for Past Attempts: None known Intentional Self Injurious Behavior: None Family Suicide History: Yes Recent stressful life event(s): Other (Comment) (Her son killed himself ) Persecutory voices/beliefs?: No Depression: Yes Depression Symptoms: Despondent;Tearfulness;Fatigue;Feeling worthless/self pity;Feeling angry/irritable;Guilt Substance abuse history and/or treatment for substance abuse?: Yes Suicide prevention information given to non-admitted patients: Not applicable  Risk to Others Homicidal Ideation: No Thoughts of Harm to Others: No Current Homicidal Intent: No Current Homicidal Plan: No Access to Homicidal Means: No Identified Victim: na History of harm to others?: No Assessment of Violence: None Noted Violent Behavior  Description: calm Does patient have access to weapons?: No Criminal Charges Pending?: No Does patient have a court date: No  Psychosis Hallucinations: None noted Delusions: None noted  Mental Status Report Appear/Hygiene: Other (Comment) Eye Contact: Fair Motor Activity: Unremarkable Speech: Logical/coherent Level of Consciousness: Alert;Quiet/awake Mood: Anxious;Depressed Affect: Appropriate to circumstance Anxiety Level: Minimal Thought Processes: Coherent;Relevant Judgement: Unimpaired Orientation: Person;Place;Time;Situation Obsessive Compulsive Thoughts/Behaviors: None  Cognitive Functioning Concentration: Decreased Memory: Recent Impaired;Remote Impaired IQ: Average Insight: Poor Impulse Control: Poor Appetite: Poor Weight Loss: 0  Weight Gain: 0  Sleep: Decreased Total Hours of Sleep: 3  Vegetative Symptoms: None  ADLScreening [facility] Assessment Services) Patient's cognitive ability adequate to safely complete daily activities?: Yes Patient able to express need for assistance with ADLs?: Yes Independently performs ADLs?: Yes  Abuse/Neglect [facility]) Physical Abuse: Denies Verbal Abuse: Denies Sexual Abuse: Denies  Prior Inpatient Therapy Prior Inpatient Therapy: Yes Prior Therapy Dates: unable to remember  Prior Therapy Facilty/Provider(s): [facility], Rehab facility in [place], [facility] [place],  Reason for Treatment: Rehab  Prior Outpatient Therapy Prior Outpatient Therapy: No Prior Therapy Dates: na  Prior Therapy Facilty/Provider(s): na Reason for Treatment: na  ADL Screening (condition at time of admission) Patient's cognitive ability adequate to safely  complete daily activities?: Yes Patient able to express need for assistance with ADLs?: Yes Independently performs ADLs?: Yes Weakness of Legs: None Weakness of Arms/Hands: None  Home Assistive Devices/Equipment Home Assistive Devices/Equipment: None  Therapy Consults (therapy consults  require a physician order) PT Evaluation Needed: No OT Evalulation Needed: No SLP Evaluation Needed: No Abuse/Neglect Assessment (Assessment to be complete while patient is alone) Physical Abuse: Denies Verbal Abuse: Denies Sexual Abuse: Denies Exploitation of patient/patient's resources: Denies Self-Neglect: Denies Values / Beliefs Cultural Requests During Hospitalization: None Spiritual Requests During Hospitalization: None Consults Spiritual Care Consult Needed: No Social Work Consult Needed: No   Nutrition Screen Unintentional weight loss greater than 10lbs within the last month: No Problems chewing or swallowing foods and/or liquids: No Home Tube Feeding or Total Parenteral Nutrition (TPN): No Patient appears severely malnourished: No Pregnant or Lactating: No  Additional Information 1:1 In Past 12 Months?: No CIRT Risk: No Elopement Risk: No Does patient have medical clearance?: Yes     Disposition: Pending placement at [facility].  Disposition Disposition of Patient: Inpatient treatment program Type of inpatient treatment program: Adult  On Site Evaluation by:   Reviewed with Physician:     Phillip Heal LaVerne 06/21/2012 9:49 AM

## 2012-06-21 NOTE — H&P (Signed)
Psychiatric Admission Assessment Adult  Patient Identification:  [name] Date of Evaluation:  06/21/2012 [id number] CC: Need alcohol detox-again  Just here 6/16-6/19 for same  History of Present Illness: Presented to [facility] smelling of alcohol with slurred speech around 8pm Tuesday evening. ETOH was 393 and UDS+for Benzoes.Says she had dental work a root canal tooth extraction bridge and cap all at one sitting. The hydrocodone made her vomit so she used alcohol to control the pain. Denies withdrawal seizures or DT's has had blackouts.  Has called [employer] today .    Past Psychiatric History: [facility] for detox June 2013 March 2013 and September 2012  Substance Abuse History:  Social History:    reports that she quit smoking about 2 years ago. She has never used smokeless tobacco. She reports that she drinks alcohol. She reports that she does not use illicit drugs. Unclear how long she has been drinking since last discharge. BA Psychology 1977 is also an [employer] fund[employer]. This is her 2nd marriage -26 years. Has a daughter age 16 who is living with patient but will be moving along with the grandchildren soon.  Patient retired 3 years ago.  Family Psych History: See old chart Past Medical History:     Past Medical History  Diagnosis Date  . Alcohol abuse   . Diabetes mellitus   . Hypertension   . Fibromyalgia        Past Surgical History  Procedure Date  . Cesarean section   . Tonsillectomy   . Bunionectomy     Allergies:  Allergies  Allergen Reactions  . Other Anaphylaxis    shellfish  . Latex Other (See Comments)    Break outs  . Lipitor (Atorvastatin Calcium)   . Pentazocine Lactate   . Codeine Rash    And nausea   . Sulfonamide Derivatives Rash    Current Medications:  Prior to Admission medications   Medication Sig Start Date End Date Taking? Authorizing Provider  aspirin 81 MG chewable tablet Chew 1 tablet (81 mg total) by mouth daily. For prevention of stroke and  heart attack 05/31/12 05/31/13 Yes Mike Craze, MD  citalopram (CELEXA) 10 MG tablet Take 1 tablet (10 mg total) by mouth daily. For depression. 05/31/12  Yes Mike Craze, MD  diphenoxylate-atropine (LOMOTIL) 2.5-0.025 MG per tablet Take 2 tablets by mouth 2 (two) times daily as needed. diarrhea    Yes Historical Provider, MD  ezetimibe (ZETIA) 10 MG tablet Take 1 tablet (10 mg total) by mouth daily. To help lower cholesterol. 05/31/12  Yes Mike Craze, MD  lisinopril (PRINIVIL,ZESTRIL) 5 MG tablet Take 1 tablet (5 mg total) by mouth daily. For control of high blood pressure 05/31/12  Yes Mike Craze, MD  metFORMIN (GLUCOPHAGE-XR) 750 MG 24 hr tablet Take 1 tablet (750 mg total) by mouth 2 (two) times daily. For control of blood sugar 05/31/12 05/29/13 Yes Mike Craze, MD  nebivolol (BYSTOLIC) 5 MG tablet Take 1 tablet (5 mg total) by mouth daily. For control of high blood pressure 05/31/12  Yes Mike Craze, MD  Omega-3 Fatty Acids (FISH OIL) 1000 MG CPDR Take 4 capsules by mouth at bedtime. For a total of 4000mg  daily for brain rehab, cholesterol control, dry eyes and several other benefits. 05/31/12  Yes Mike Craze, MD  thiamine 100 MG tablet Take 1 tablet (100 mg total) by mouth daily. For nutritional supplementation. 05/31/12 05/31/13 Yes Mike Craze, MD    Mental  Status Examination/Evaluation: Objective:  Appearance: Casual  Psychomotor Activity:  Decreased  Eye Contact::  Good  Speech:  Clear and Coherent and Normal Rate  Volume:  Normal  Mood:appropriate to situation     Affect:  Constricted  Thought Process:  Clear rational goal oriented -go to [employer]   Orientation:  Full  Thought Content:  No AVH/psychosis   Suicidal Thoughts:  No  Homicidal Thoughts:  No  Judgement:  Fair  Insight:  Good    DIAGNOSIS:    AXIS I Alcohol Abuse, Substance Abuse and Substance Induced Mood Disorder  AXIS II Deferred  AXIS III See medical history.  AXIS IV Chronic alcoholism     AXIS V 41-50 serious symptoms     Treatment Plan Summary: Admit for medically supported alcohol detox  Continue home meds adjust as indicated  Patient plans to enroll in an [facility].

## 2012-06-21 NOTE — ED Notes (Signed)
Pt lying resting in bed, NAD at this time. States that she "is still a little shaky".

## 2012-06-21 NOTE — BH Assessment (Signed)
Assessment Note   [name] is an 66 y.o. female requesting detox from alcohol. Patient has a BAL of 393 and has tested positive for Benzos. Patient reports that she has been prescribed hydrocodone due to her Fibromyalgia and a tooth pain. Patient reports that her last intake of alcohol was 4 hours ago when she drank four small bottles of wine. Patient did not remember the size of the bottles. Patient did not remember when she last took any pills. Patient denies having a problem with hydrocodone. Patient reports the following withdrawal symptoms shakes,nausea and stomach cramps.   Patient stated she has been in and out of [employer], detox, rehabs and treatment facilities for the past 30 years. I have been in this hospital for detoxification x 2 in the past year. Patient reports that she has received treatment at [facility] x 2, [facility], [place], [facility] [address] and [address]. Patient reports. Patient was not able to remember the dates of placement. Patient reports that her longest period of sobriety for 6 years. Patient reports that she uses alcohol cope with my health problems. Patient denies any SI/HI. Patient denies any psychosis.   Information reviewed at [facility] and pt accepted by Verne Spurr, PA to Dr. Koren Shiver (302-1). Updated EDP and RN. Pt is voluntary & to be transported via security.  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 Alcohol abuse   . Diabetes mellitus   . Hypertension    Axis IV: other psychosocial or environmental problems and problems with primary support group Axis V: 31-40 impairment in reality testing  Past Medical History:  Past Medical History  Diagnosis Date  . Alcohol abuse   . Diabetes mellitus   . Hypertension     Past Surgical History  Procedure Date  . Cesarean section   . Tonsillectomy   . Bunionectomy     Family History: No family history on  file.  Social History:  reports that she quit smoking about 2 years ago. She has never used smokeless tobacco. She reports that she drinks alcohol. She reports that she does not use illicit drugs.  Additional Social History:  Alcohol / Drug Use Pain Medications: N/A Prescriptions: See PTA Listing Over the Counter: N/A History of alcohol / drug use?: Yes Longest period of sobriety (when/how long): 6 yrs Substance #1 Name of Substance 1: Alcohol  1 - Age of First Use: 20 1 - Amount (size/oz): varies (3-4) bottles of wine.  1 - Duration: Paitent reports that she has been drinking for over 30years.  1 - Last Use / Amount: 4 small bottles of wine. Today   Substance #2 Name of Substance 2: hydrocodone 2 - Age of First Use: unable to remember  2 - Amount (size/oz): unable to remember  2 - Frequency: unable to remember  2 - Duration: unable to rememeber 2 - Last Use / Amount: Today  Substance #3 Name of Substance 3: Alcohol  3 - Age of First Use: 20 3 - Amount (size/oz): 8 small bottles of wine   3 - Frequency: Daily  3 - Duration: for the past two weeks  3 - Last Use / Amount: 4 bottles of wine. She did not remember the size of the bottle.    CIWA: CIWA-Ar BP: 152/69 mmHg Pulse Rate: 85  Nausea and Vomiting: 2 Tactile Disturbances: very mild itching, pins and needles, burning or numbness Tremor: two Auditory  Disturbances: not present Paroxysmal Sweats: two Visual Disturbances: not present Anxiety: mildly anxious Headache, Fullness in Head: mild Agitation: somewhat more than normal activity Orientation and Clouding of Sensorium: oriented and can do serial additions CIWA-Ar Total: 11  COWS: Clinical Opiate Withdrawal Scale (COWS) Resting Pulse Rate: Pulse Rate 81-100 Sweating: Subjective report of chills or flushing Restlessness: Reports difficulty sitting still, but is able to do so Pupil Size: Pupils possibly larger than normal for room light Bone or Joint Aches: Mild  diffuse discomfort Runny Nose or Tearing: Nasal stuffiness or unusually moist eyes GI Upset: nausea or loose stool Tremor: Slight tremor observable Yawning: Yawning three or more times during assessment Anxiety or Irritability: Patient reports increasing irritability or anxiousness Gooseflesh Skin: Skin is smooth COWS Total Score: 13   Allergies:  Allergies  Allergen Reactions  . Other Anaphylaxis    shellfish  . Latex Other (See Comments)    Break outs  . Lipitor (Atorvastatin Calcium)   . Pentazocine Lactate   . Codeine Rash    And nausea   . Sulfonamide Derivatives Rash    Home Medications:  (Not in a hospital admission)  OB/GYN Status:  No LMP recorded. Patient is postmenopausal.  General Assessment Data Location of Assessment: [facility] ACT Assessment: Yes Living Arrangements: Alone Can pt return to current living arrangement?: Yes Admission Status: Voluntary Is patient capable of signing voluntary admission?: Yes Transfer from: Acute Hospital Referral Source: Self/Family/Friend     Risk to self Suicidal Ideation: No Suicidal Intent: No Is patient at risk for suicide?: No Suicidal Plan?: No Access to Means: No What has been your use of drugs/alcohol within the last 12 months?: alcohol, hydrocodone  Previous Attempts/Gestures: No How many times?: 0  Other Self Harm Risks: 0 Triggers for Past Attempts: None known Intentional Self Injurious Behavior: None Family Suicide History: Yes Recent stressful life event(s): Other (Comment) (Her son killed himself ) Persecutory voices/beliefs?: No Depression: Yes Depression Symptoms: Despondent;Tearfulness;Fatigue;Feeling worthless/self pity;Feeling angry/irritable;Guilt Substance abuse history and/or treatment for substance abuse?: Yes Suicide prevention information given to non-admitted patients: Not applicable  Risk to Others Homicidal Ideation: No Thoughts of Harm to Others: No Current Homicidal Intent:  No Current Homicidal Plan: No Access to Homicidal Means: No Identified Victim: na History of harm to others?: No Assessment of Violence: None Noted Violent Behavior Description: calm Does patient have access to weapons?: No Criminal Charges Pending?: No Does patient have a court date: No  Psychosis Hallucinations: None noted Delusions: None noted  Mental Status Report Appear/Hygiene: Other (Comment) Eye Contact: Fair Motor Activity: Unremarkable Speech: Logical/coherent Level of Consciousness: Alert;Quiet/awake Mood: Anxious;Depressed Affect: Appropriate to circumstance Anxiety Level: Minimal Thought Processes: Coherent;Relevant Judgement: Unimpaired Orientation: Person;Place;Time;Situation Obsessive Compulsive Thoughts/Behaviors: None  Cognitive Functioning Concentration: Decreased Memory: Recent Impaired;Remote Impaired IQ: Average Insight: Poor Impulse Control: Poor Appetite: Poor Weight Loss: 0  Weight Gain: 0  Sleep: Decreased Total Hours of Sleep: 3  Vegetative Symptoms: None  ADLScreening [facility] Assessment Services) Patient's cognitive ability adequate to safely complete daily activities?: Yes Patient able to express need for assistance with ADLs?: Yes Independently performs ADLs?: Yes  Abuse/Neglect [facility]) Physical Abuse: Denies Verbal Abuse: Denies Sexual Abuse: Denies  Prior Inpatient Therapy Prior Inpatient Therapy: Yes Prior Therapy Dates: unable to remember  Prior Therapy Facilty/Provider(s): [facility] Rehab facility in [place], [facility] [place],  Reason for Treatment: Rehab  Prior Outpatient Therapy Prior Outpatient Therapy: No Prior Therapy Dates: na  Prior Therapy Facilty/Provider(s): na Reason for Treatment: na  ADL Screening (condition at time of admission) Patient's cognitive ability adequate to safely complete daily activities?: Yes Patient able to express need for assistance with ADLs?: Yes Independently performs ADLs?:  Yes Weakness of Legs: None Weakness of Arms/Hands: None  Home Assistive Devices/Equipment Home Assistive Devices/Equipment: None  Therapy Consults (therapy consults require a physician order) PT Evaluation Needed: No OT Evalulation Needed: No SLP Evaluation Needed: No Abuse/Neglect Assessment (Assessment to be complete while patient is alone) Physical Abuse: Denies Verbal Abuse: Denies Sexual Abuse: Denies Exploitation of patient/patient's resources: Denies Self-Neglect: Denies Values / Beliefs Cultural Requests During Hospitalization: None Spiritual Requests During Hospitalization: None Consults Spiritual Care Consult Needed: No Social Work Consult Needed: No [employer] (For Healthcare) Advance Directive: Patient does not have advance directive;Patient would not like information Pre-existing out of facility DNR order (yellow form or pink MOST form): No Nutrition Screen Unintentional weight loss greater than 10lbs within the last month: No Problems chewing or swallowing foods and/or liquids: No Home Tube Feeding or Total Parenteral Nutrition (TPN): No Patient appears severely malnourished: No Pregnant or Lactating: No  Additional Information 1:1 In Past 12 Months?: No CIRT Risk: No Elopement Risk: No Does patient have medical clearance?: Yes     Disposition:  Disposition Disposition of Patient: Inpatient treatment program;Referred to Type of inpatient treatment program: Adult Patient referred to: Other (Comment) (Accepted [facility] to [place] (302-1))  On Site Evaluation by:   Reviewed with Physician:     Romeo Apple 06/21/2012 12:31 PM

## 2012-06-22 LAB — GLUCOSE, CAPILLARY
Glucose-Capillary: 109 mg/dL — ABNORMAL HIGH (ref 70–99)
Glucose-Capillary: 103 mg/dL — ABNORMAL HIGH (ref 70–99)
Glucose-Capillary: 111 mg/dL — ABNORMAL HIGH (ref 70–99)
Glucose-Capillary: 137 mg/dL — ABNORMAL HIGH (ref 70–99)

## 2012-06-22 NOTE — Progress Notes (Signed)
Pt. About on the unit this am.  She reports that she slept well but that she doesn't feel that well because of her diarrhea.  Reports appetite is poor and only ate about 1/2 of her breakfast.   She rates her depression as a 7/10 and feelings of hopelessness as a 7/10.  Reports some withdrawal symptoms of tremors, chilling and diarrhea.  Is attending groups.  A:  RN offered support and encouragement.  Given meds as prescribed and prn for the diarrhea/loose stools.  R:  Pt. Receptive to staff.  Remains on q 15 min. Checks for safety.  Will continue to monitor.

## 2012-06-22 NOTE — Progress Notes (Signed)
Adult Psychosocial Assessment Update Interdisciplinary Team  Previous [facility] admissions/discharges:  Admissions Discharges  Date:05-28-12 Date: 05/30/12  Date:03-12-12 Date: 03/14/12  Date:08/25/11 Date:  08/28/11  Date: Date:  Date: Date:   Changes since the last Psychosocial Assessment (including adherence to outpatient mental health and/or substance abuse treatment, situational issues contributing to decompensation and/or relapse). Patient reports she discharged from here 3 weeks ago and returned to alcohol use with   in 7-10 days after oral surgery. Patient reports increased drinking over the last 7 days to    point that grandchildren who are visiting are "wondering what is wrong with Grandma."           Discharge Plan 1. Will you be returning to the same living situation after discharge?   Yes: X No:      If no, what is your plan?     Until daughter returns to care for kids at which time patient reports she intends to    transition into an [facility].      2. Would you like a referral for services when you are discharged? Yes:  X   If yes, for what services?  No:         Patient's followup appointment with physician after last discharge coincided with dental    Appointment thus she needs another followup     Summary and Recommendations (to be completed by the evaluator)   Patient is 66 year old married retired caucasian female admitted with diagnosis of     alcohol dependence  This is patient's fourth admit within 10 months for same issue.     Patient will benefit from crisis stabilization, medication evaluation, group therapy and      psycho ed groups, in addition to case management for discharge planning.                 Signature:  Clide Dales, 06/22/2012 5:35 PM

## 2012-06-22 NOTE — Treatment Plan (Signed)
Interdisciplinary Treatment Plan Update (Adult)  Date: 06/22/2012  Time Reviewed: 2:55 PM   Progress in Treatment: Attending groups: Yes Participating in groups: Yes Taking medication as prescribed: Yes Tolerating medication: Yes   Family/Significant other contact made: No  Patient understands diagnosis:  Yes  As evidenced by asking for help with alcohol detox Discussing patient identified problems/goals with staff:  Yes  See below Medical problems stabilized or resolved:  Yes Denies suicidal/homicidal ideation: Yes  In AM group Issues/concerns per patient self-inventory:  Not filled out Other:  New problem(s) identified: N/A  Reason for Continuation of Hospitalization: Medication stabilization Withdrawal symptoms  Interventions implemented related to continuation of hospitalization:  Librium taper  Encourage group attendance and participation  Additional comments:  Estimated length of stay: 2-3 days  Discharge Plan: See below  New goal(s): N/A  Review of initial/current patient goals per problem list:   1.  Goal(s): Identify comprehensive sobriety plan  Met:  Yes  Target date:7/11  As evidenced YN:[name] is calling [facility] to try to secure bed from here.  Plans to attend daily AA mtgs  2.  Goal (s):Safely detox from alcohol  Met:  No  Target date:7/13  As evidenced [name] vitals, no withdrawal symptoms  3.  Goal(s):  Met:  No  Target date:  As evidenced by:  4.  Goal(s):  Met:  No  Target date:  As evidenced by:  Attendees: Patient:     Family:     Physician:  [name] 06/22/2012 2:55 PM   Nursing:    06/22/2012 2:55 PM   Case Manager:  Richelle Ito, LCSW 06/22/2012 2:55 PM   Counselor:   06/22/2012 2:55 PM   Other:     Other:     Other:     Other:      Scribe for Treatment Team:   Ida Rogue, 06/22/2012 2:55 PM

## 2012-06-22 NOTE — Progress Notes (Signed)
[facility] Group Notes:  (Counselor/Nursing/MHT/Case Management/Adjunct)  06/22/2012   Type of Therapy:  Group Therapy at 1:15 to 2:30  Participation Level:  Active  Participation Quality:  Appropriate  Affect:  Appropriate  Cognitive:  Appropriate  Insight:  Improved  Engagement in Group:  Good  Engagement in Therapy:  Good  Modes of Intervention:  Clarification, Socialization and Support  Summary of Progress/Problems: Kaydynce participated in activity in which patients choose photographs to represent what their life would look and feel like were it in balance and another for out of balance. For out of balance Avenell choose to share photo of a young man walking into a campus building which represents her soon who committed suicide several years ago this month.  For balance she choose a photo of a baby parrot as he grand children are here this month and she is helping them learn to  Handle her parrot which is lots of fun for all.  Pleasant shared about her new willingness to seek further long term help by going into an [address] once she gets the grandchildren settled.    Clide Dales 06/22/2012, 6:14 PM

## 2012-06-22 NOTE — Progress Notes (Signed)
Psychoeducational Group Note  Date:  06/22/2012 Time:  1100  Group Topic/Focus:  Rediscovering Joy:   The focus of this group is to explore various ways to relieve stress in a positive manner.  Participation Level:  Active  Participation Quality:  Appropriate  Affect:  Appropriate  Cognitive:  Appropriate  Insight:  Good  Engagement in Group:  Good  Additional Comments:  Pt was in and out. Pt stated "I have an overly active bladder"  Saban Heinlen, Genia Plants 06/22/2012, 1:57 PM

## 2012-06-22 NOTE — Progress Notes (Signed)
[facility] Adult Inpatient Family/Significant Other Collateral Contact  Collateral Contact:  Husband, Laurna Shetley at [phone] has been identified by the patient as the family member/significant other who can provide for collateral information. With written consent from the patient, the family member/significant other has been contacted and reports:   That he has no new collateral information to offer, no additional concerns other than current alcohol use.   Mr Enzor is extremely supportive of patient discharging Sunday as he needs to return to work thus patient will need to care for grandchildren  Clinical research associate provided phone number and   [facility] Crisis Unit telephone number  Clide Dales 06/22/2012 4:45 PM

## 2012-06-22 NOTE — Progress Notes (Signed)
06/22/2012         Time: 1500      Group Topic/Focus: The focus of this group is on enhancing patients' problem solving skills, which involves identifying the problem, brainstorming solutions and choosing and trying a solution.    Participation Level: Active  Participation Quality: Appropriate and Redirectable  Affect: Labile  Cognitive: Oriented  Additional Comments: Patient appeared irritable upon arriving late to group, reports nothing good has happened to her. With time patient settled into the activity and joined in with her peers, reporting feeling "hyper" by the end of the activity.  Danen Lapaglia 06/22/2012 3:52 PM

## 2012-06-22 NOTE — Discharge Planning (Signed)
Returning patient states she is here for detox.  Says when she left her just recently she had oral surgery and instead of taking vicoden she drank.  Plans to get into an [facility] from here.  States she has a list, and she is looking in [place], [place] and [place].  Seems intent on getting into supportive, sober living situation.

## 2012-06-22 NOTE — Progress Notes (Signed)
Psychoeducational Group Note  Date:  06/22/2012 Time: 0900  Group Topic/Focus:  Karaoke Group  Participation Level: Did Not Attend  Participation Quality:  Not Applicable  Affect:  Not Applicable  Cognitive:  Not Applicable  Insight:  Not Applicable  Engagement in Group: Not Applicable  Additional Comments:  Pt said couldn't attend because she has been up and down to the bathroom with diarehea all day.Marland Kitchen  Shelah Lewandowsky 06/22/2012, 10:46 PM

## 2012-06-22 NOTE — Progress Notes (Signed)
D: Pt in bed resting with eyes closed. Respirations even and unlabored. Pt appears to be in no signs of distress at this time. A: Q15min checks remains for this pt. R: Pt remains safe at this time.   

## 2012-06-23 DIAGNOSIS — F1020 Alcohol dependence, uncomplicated: Secondary | ICD-10-CM

## 2012-06-23 LAB — GLUCOSE, CAPILLARY
Glucose-Capillary: 117 mg/dL — ABNORMAL HIGH (ref 70–99)
Glucose-Capillary: 103 mg/dL — ABNORMAL HIGH (ref 70–99)
Glucose-Capillary: 110 mg/dL — ABNORMAL HIGH (ref 70–99)
Glucose-Capillary: 129 mg/dL — ABNORMAL HIGH (ref 70–99)

## 2012-06-23 NOTE — Progress Notes (Signed)
D) Pt has attended the groups, participates and interacts with her peers. Pt rates her depression at a 0 and her hopelessness at a 2. Rates her energy level at a low and states her appetite is improving. Pt continues to have physical problems r/t her withdrawal of diarrhea and stomach pain. Pt. States that she has plans to leave her and go to the [facility] after finished with her detox. Pt refused her librium today because she states it makes her very sleepy and wants to be alert to attend the groups A) Given support, reassurance and praise. Encouraged to come to the staff with her medical issues r/t the withdrawal R) Remains safe.

## 2012-06-23 NOTE — [facility] Suicide Risk Assessment (Signed)
Suicide Risk Assessment  Admission Assessment      Demographic factors:  See chart.  Current Mental Status:  Patient seen and evaluated. Chart reviewed. Patient stated that her mood was "ok". Readmitted s/p relapse after the 4th of July.  Trying to now get into an [facility], but had to "detox".  VSS, pt sedated from librium.  Discontinued at this time.  Her affect was mood congruent and euthymic. She denied any current thoughts of self injurious behavior, suicidal ideation or homicidal ideation. There were no auditory or visual hallucinations, paranoia, delusional thought processes, or mania noted.  Thought process was linear and goal directed.  No psychomotor agitation or retardation was noted. Speech was normal rate, tone and volume. Eye contact was good. Judgment and insight are limited.  Patient has been up and engaged on the unit.  No acute safety concerns reported from team.  No sig withdrawal s/s noted at this time.   Loss Factors: Son committed suicide; trauma  Historical Factors:  Polysubstance Dep, in remission (cocaine, cannabis, benzos); DUIs; Hx SI with attempts while intoxicated  Risk Reduction Factors: motivated by grandchildren; married   CLINICAL FACTORS: Alcohol Dependence & W/D; Polysubstance Dependence, in remission (Cocaine, Cannabis & Benzodiazepines); SIMD  COGNITIVE FEATURES THAT CONTRIBUTE TO RISK: limited insight.  SUICIDE RISK: Pt viewed as a chronic increased risk of harm to self in light of her past hx and risk factors.  No acute safety concerns on the unit.  Pt contracting for safety and in need of crisis stabilization & Tx.  PLAN OF CARE: Pt admitted for crisis stabilization, detox off alcohol and treatment.  Please see orders.  Medications reviewed with pt and medication education provided.  Will continue q15 minute checks per unit protocol.  No clinical indication for one on one level of observation at this time.  Pt contracting for safety.  Mental health  treatment, medication management and continued sobriety will mitigate against the increased risk of harm to self and/or others.  Discussed the importance of recovery with pt, as well as, tools to move forward in a healthy & safe manner.  Pt agreeable with the plan.  Discussed with the team.  D/C librium s/p c/o sedation and VSS.  Pt requesting discharge in am.   (SRA not completed by team upon admission; thus, done today instead of progress note.)  Lupe Carney 06/23/2012, 3:55 PM

## 2012-06-23 NOTE — Progress Notes (Signed)
[facility] Group Notes:  (Counselor/Nursing/MHT/Case Management/Adjunct)  06/23/2012 2:49 PM  Type of Therapy:  Group Therapy from 1:15 to 2:30  Participation Level:  Minimal as pt was only present for 15 minutes  Participation Quality:  Appropriate and Sharing  Affect:  Appropriate  Cognitive:  Alert and Oriented  Insight:  Limited  Engagement in Group:  Minimal as only present for 15 minutes  Engagement in Therapy:  Limited  Modes of Intervention:  Clarification, Orientation and Support  Summary of Progress/Problems: Howard came into processing group with only 15 minutes left during the time yet was oriented by facilitator and shared some changes she is willing to make to avoid relapse. Chimamanda shared she can now use the monies used to pay cab to get to the liquor store to get her to a meeting. Analeia was more focused on the logistics than willing to address her feelings about changing.    Clide Dales 06/23/2012, 2:49 PM

## 2012-06-23 NOTE — Progress Notes (Signed)
D.  Pt pleasant on approach, positive for evening AA group.  Pt reports that she will be discharged in the AM tomorrow and feels that she is ready for this.  Denies SI/HI/hallucinations.  Slightly concerned that she may not be able to sleep tonight due to Librium being discontinued.  Interacting appropriately within milieu.  A.  Support and encouragement offered, will continue to monitor.  R.  Pt in dayroom watching TV with peers and eating snack.  No distress noted.

## 2012-06-23 NOTE — H&P (Signed)
Reviewed today and signed for Dr. Dan Humphreys.

## 2012-06-23 NOTE — Progress Notes (Signed)
D: Pt in bed resting with eyes closed. Respirations even and unlabored. Pt appears to be in no signs of distress at this time. A: Q15min checks remains for this pt. R: Pt remains safe at this time.   

## 2012-06-23 NOTE — Progress Notes (Signed)
Psychoeducational Group Note  Date:  06/23/2012 Time:  0865-7846  Group Topic/Focus:  Early Warning Signs:   The focus of this group is to help patients identify signs or symptoms they exhibit before slipping into an unhealthy state or crisis.  Participation Level:  Active  Participation Quality:  Appropriate  Affect:  Appropriate  Cognitive:  Appropriate  Insight:  Good  Engagement in Group:  Good  Additional Comments:  none  Demani Weyrauch M 06/23/2012, 1:48 PM

## 2012-06-24 LAB — GLUCOSE, CAPILLARY
Glucose-Capillary: 119 mg/dL — ABNORMAL HIGH (ref 70–99)
Glucose-Capillary: 110 mg/dL — ABNORMAL HIGH (ref 70–99)

## 2012-06-24 MED ORDER — CITALOPRAM HYDROBROMIDE 10 MG PO TABS: 10.0000 mg | ORAL_TABLET | Freq: Every day | ORAL | Status: DC

## 2012-06-24 MED ORDER — HYDROXYZINE HCL 50 MG PO TABS: 50.0000 mg | ORAL_TABLET | Freq: Every evening | ORAL | Status: DC | PRN

## 2012-06-24 NOTE — [facility] Suicide Risk Assessment (Signed)
Suicide Risk Assessment  Discharge Assessment     Demographic factors:  Caucasian  (none of the above)  Current Mental Status Per Nursing Assessment::   On Admission:    At Discharge:   (denies SI and HI, delusion and wanting to drink.)  Current Mental Status Per Physician: Patient was calm and cooperative. She has good mood and bright affect. She has normal speech and thought process. She has no suicidal or homicidal ideations. Intension or plan. She has plan of participating in AA and rehab services as scheduled. Her husband has been supportive to her.  Loss Factors:    Historical Factors:    Risk Reduction Factors:   Living with another person, especially a relative;Positive coping skills or problem solving skills  Continued Clinical Symptoms:  Severe Anxiety and/or Agitation Depression:   Anhedonia Alcohol/Substance Abuse/Dependencies  Discharge Diagnoses:   AXIS I:  Alcohol Abuse and Substance Induced Mood Disorder AXIS II:  Deferred AXIS III:   Past Medical History  Diagnosis Date  . Alcohol abuse   . Diabetes mellitus   . Hypertension   . Fibromyalgia    AXIS IV:  other psychosocial or environmental problems, problems related to social environment and problems with access to health care services AXIS V:  61-70 mild symptoms  Cognitive Features That Contribute To Risk:  Loss of executive function Polarized thinking    Suicide Risk:  Minimal: No identifiable suicidal ideation.  Patients presenting with no risk factors but with morbid ruminations; may be classified as minimal risk based on the severity of the depressive symptoms  Plan Of Care/Follow-up recommendations:  Activity:  Normal  Diet:  regular Follow up as per case manager arrangements. She has no psychotropics and does not required scripts at discharge. She has home medication for medical conditions.  Graham Hyun,JANARDHAHA R. 06/24/2012, 1:49 PM

## 2012-06-24 NOTE — Progress Notes (Signed)
Patient ID: [name], female   DOB: 1946/11/14, 66 y.o.   MRN: [id number]  Pt. attended and participated in aftercare planning group. Pt. verbally accepted information on suicide prevention, warning signs to look for with suicide and crisis line numbers to use.  Pt. listed their current anxiety level as 0 and their current depression level as 0. Pt. is set to discharge today and has been contacting [facility] but does not have an interview as of yet. Pt. stated that she will be going home until she can find an [facility] and that her husband will be picking her up. Pt. stated that she has an appointment with Dr. Serita Grit at the beginning of August.

## 2012-06-24 NOTE — Progress Notes (Signed)
Psychoeducational Group Note  Date:  06/24/2012 Time:  0315  Group Topic/Focus:  Healthy Communication:   The focus of this group is to discuss communication, barriers to communication, as well as healthy ways to communicate with others.  Participation Level:  Did Not Attend  Additional Comments:  Pt did not attend.  Dalia Heading 06/24/2012, 7:25 PM

## 2012-06-24 NOTE — Progress Notes (Signed)
Patient ID: [name], female   DOB: 1946/08/27, 66 y.o.   MRN: [id number] 06-24-12 pt stated she was ready for discharge. She got her scripts, her f/u instructions her belongings and her labs. She stated she understood her d/c instructions. Denies any si/hi/av. Pt was walked to the lobby and her husband was waiting to transport her.

## 2012-06-24 NOTE — Progress Notes (Signed)
Psychoeducational Group Note  Date:  06/23/2012 Time:  2000  Group Topic/Focus:  AA  Participation Level:  Active  Participation Quality:  Appropriate  Affect:  Appropriate  Cognitive:  Alert  Insight:  Good  Engagement in Group:  Good  Additional Comments:  Pt attended and participated in AA group this evening.  Kaleen Odea R 06/24/2012, 1:41 AM

## 2012-06-24 NOTE — Progress Notes (Addendum)
[facility] Case Management Discharge Plan:  Will you be returning to the same living situation after discharge: Yes,  home with husband At discharge, do you have transportation home?:Yes,  husband Do you have the ability to pay for your medications:Yes,    Interagency Information:     Release of information consent forms completed and in the chart;  Patient's signature needed at discharge.  Patient to Follow up at: Dr Evelene Croon pt has a apt in Aug and will call if needing a earlier apt.    Patient denies SI/HI:   Yes,      Safety Planning and Suicide Prevention discussed:  Yes,  with pt and w husband  Barrier to discharge identified:No.  Summary and Recommendations:Pt was cleared for D/C and denies any and all S/I and H/I.     Gevena Mart 06/24/2012, 12:14 PM

## 2012-06-24 NOTE — Progress Notes (Signed)
Patient ID: Wilhemina Bonito, female   DOB: [birth year], 66 y.o.   MRN: [id number]   [facility] Group Notes:  (Counselor/Nursing/MHT/Case Management/Adjunct)  06/24/2012 1:15 PM  Type of Therapy:  Group Therapy, Dance/Movement Therapy   Participation Level:  Active  Participation Quality:  Appropriate  Affect:  Appropriate  Cognitive:  Appropriate  Insight:  Good  Engagement in Group:  Good  Engagement in Therapy:  Good  Modes of Intervention:  Clarification, Problem-solving, Role-play, Socialization and Support  Summary of Progress/Problems: Therapist and group members discussed self-sabotaging behaviors and things in our lives that need to change in order to achieve recovery. Group discussed how change can be scary and that we need to focus on recovery instead of focusing on our "war stories." Pt. shared that she was feeling good today. Pt. stated that we can sabotage ourselves with the people, places, and things that surround [place]. Pt. stated that dwelling on the bad is one of her self-sabotaging behaviors.     Cassidi Long 06/24/2012. 3:13 PM

## 2012-06-24 NOTE — Progress Notes (Signed)
[facility] Adult Inpatient Family/Significant Other Suicide Prevention Education  Suicide Prevention Education:  Education Completed; [name] (Husband) has been identified by the patient as the family member/significant other with whom the patient will be residing, and identified as the person(s) who will aid the patient in the event of a mental health crisis (suicidal ideations/suicide attempt).  With written consent from the patient, the family member/significant other has been provided the following suicide prevention education, prior to the and/or following the discharge of the patient.  The suicide prevention education provided includes the following:  Suicide risk factors  Suicide prevention and interventions  National Suicide Hotline telephone number  [facility] assessment telephone number  [facility] Emergency Assistance 911  [facility] and/or Residential Mobile Crisis Unit telephone number  Request made of family/significant other to:  Remove weapons (e.g., guns, rifles, knives), all items previously/currently identified as safety concern.    Remove drugs/medications (over-the-counter, prescriptions, illicit drugs), all items previously/currently identified as a safety concern.  The family member/significant other verbalizes understanding of the suicide prevention education information provided.  The family member/significant other agrees to remove the items of safety concern listed above.  Husband will pick up the pt and has no concerns. He confirmed the plan that the pt has about going to an [place] [facility] and keeping the grand kids until then he stated "the kids will keep her busy". Husband confirmed that the pt will go to meetings at the [employer]  [facility] 06/24/2012, 12:06 PM

## 2012-06-27 NOTE — Progress Notes (Signed)
Patient Discharge Instructions:  After Visit Summary (AVS):   Faxed to:  06/27/2012 Psychiatric Admission Assessment Note:   Faxed to:  06/27/2012 Suicide Risk Assessment - Discharge Assessment:   Faxed to:  06/27/2012 Faxed/Sent to the Next Level Care provider:  06/27/2012'  Faxed to [facility] - Dr. Evelene Croon @ [phone]  Wandra Scot, 06/27/2012, 6:49 PM

## 2012-07-05 ENCOUNTER — Other Ambulatory Visit (HOSPITAL_COMMUNITY): Payer: Self-pay | Admitting: Interventional Radiology

## 2012-07-05 ENCOUNTER — Telehealth (HOSPITAL_COMMUNITY): Payer: Self-pay

## 2012-07-05 DIAGNOSIS — I771 Stricture of artery: Secondary | ICD-10-CM

## 2012-07-05 NOTE — Telephone Encounter (Signed)
I spoke with [name] to schedule her f/u MRI/MRA.  She advised that it would have to be in Sept  Due to her having her grandchild.  I have the apt scheduled for 08-22-12

## 2012-07-24 NOTE — [facility] Suicide Risk Assessment (Cosign Needed)
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  Alcohol abuse    .  Diabetes mellitus    .  Hypertension    .  Fibromyalgia    AXIS IV: other psychosocial or environmental problems, problems related to social environment and problems with access to health care services  AXIS V: 61-70 mild symptoms   Level of Care:  OP  Hospital Course:  Shandy was admitted after relapsing on alcohol and benzodiazepines.  She reported that she had had root canal, tooth extracted and bridge all in one setting.  The Hydrocodone she was given caused nausea and vomiting so she started drinking to relive the pain.     She was treated with the librium protocol. Medical problems were identified and treated.  Home medication was restarted as appropriate.     Improvement was monitored by CIWA/COWS scores and patient's daily report of withdrawal symptom reduction. Emotional and mental status was monitored by daily self inventory reports completed by the patient and clinical staff.      The patient was evaluated by the treatment team for stability and plans for continued recovery upon discharge. She was offered further treatment options upon discharge including Residential, IOP, and Outpatient treatment.  The patient's motivation was an integral factor for scheduling further treatment.  Employment, transportation, bed availability, health status, family support, and any pending legal issues were also considered.    Upon completion of detox the patient  was both mentally and medically stable for discharge.   Follow-up Information    Follow up with Glori Bickers, MD. (pt has apt in Aug)    Contact information:   [address] [address] [facility] [place] [address] [phone]          Consults:  None  Significant Diagnostic Studies:  None  Discharge Vitals:   Blood pressure 121/79, pulse 106, temperature 97.7 F (36.5 C), temperature source Oral, resp. rate 18, height 5\' 2"  (1.575 m), weight 67.586 kg (149 lb)..  Mental Status Exam: See Mental Status Examination and Suicide Risk Assessment completed by Attending Physician prior to discharge.  Discharge destination:  Home  Is patient on multiple antipsychotic therapies at discharge:  No  Has Patient had three or more failed trials of antipsychotic monotherapy by history: N/A Recommended Plan for Multiple Antipsychotic Therapies: N/A Discharge Orders    Future Appointments: Provider: Department: Dept Phone: Center:   08/22/2012 12:00 PM [facility] [phone] [facility]   08/22/2012 1:00 PM [facility] [phone] [facility]     Future Orders Please Complete By Expires   Diet - low sodium heart healthy      Increase activity slowly      Discharge instructions      Comments:   Take all of your medications as prescribed.  Be sure to keep ALL follow up appointments as scheduled. This is to ensure getting your refills on time to avoid any interruption in your medication.  If you find that you can  not keep your appointment, call the clinic and reschedule. Be sure to tell the nurse if you will need a refill before your appointment.     Medication List  As of 07/24/2012  5:02 PM   TAKE these medications      Indication    aspirin 81 MG chewable tablet   Chew 1 tablet (81 mg total) by mouth daily. For prevention of stroke and heart attack       citalopram 10 MG tablet   Commonly known as: CELEXA   Take 1 tablet (10 mg total) by mouth daily. For  depression.    Indication: Depression      diphenoxylate-atropine 2.5-0.025 MG per tablet   Commonly known as: LOMOTIL   Take 2 tablets by mouth 2 (two) times daily as needed. diarrhea       ezetimibe 10 MG tablet   Commonly known as: ZETIA   Take 1 tablet (10 mg total) by mouth daily. To help lower cholesterol.       Fish Oil 1000 MG Cpdr   Take 4 capsules by mouth at bedtime. For a total of 4000mg  daily for brain rehab, cholesterol control, dry eyes and several other benefits.       lisinopril 5 MG tablet   Commonly known as: PRINIVIL,ZESTRIL   Take 1 tablet (5 mg total) by mouth daily. For control of high blood pressure       metFORMIN 750 MG 24 hr tablet   Commonly known as: GLUCOPHAGE-XR   Take 1 tablet (750 mg total) by mouth 2 (two) times daily. For control of blood sugar       nebivolol 5 MG tablet   Commonly known as: BYSTOLIC   Take 1 tablet (5 mg total) by mouth daily. For control of high blood pressure       thiamine 100 MG tablet   Take 1 tablet (100 mg total) by mouth daily. For nutritional supplementation.            Follow-up Information    Follow up with Glori Bickers, MD. (pt has apt in Aug)    Contact information:   [address] [address] [place] [address] [phone]    Follow-up recommendations:   Activities: Resume typical activities Diet: Resume typical diet  Other: Follow up with outpatient provider and report any side effects to out patient prescriber.  Comments:  Take all your medications as prescribed by your mental healthcare provider. Report any adverse effects and or reactions from your medicines to your outpatient provider promptly. Patient is instructed and cautioned to not engage in alcohol and or illegal drug use while on prescription medicines. In the event of worsening symptoms, patient is instructed to call the crisis hotline, 911 and or go to the nearest ED for appropriate evaluation  and treatment of symptoms.  Signed: Rona Ravens. Dalisa Forrer PAC For Dr. Lupe Carney 07/24/2012 5:02 PM

## 2012-07-27 IMAGING — CT CT ABD-PELV W/ CM
1 of 2 series · 15 of 32 positions shown, 19 images · IV contrast (omnipaque)
Comparison: 10/21/2010

CLINICAL DATA: Abdominal pain, nausea, vomiting.

CT ABDOMEN AND PELVIS WITH CONTRAST
TECHNIQUE: Multidetector CT imaging of the abdomen and pelvis was
performed following the standard protocol during bolus
administration of intravenous contrast.
Contrast: 100 ml Omnipaque 300 IV.

[Series 2: rtn ap with st · axial · 0.74mm/px · z∈[+716,+1136]mm · 15 of 94 slices shown, 19 images]
[im 5/94  soft-tissue]
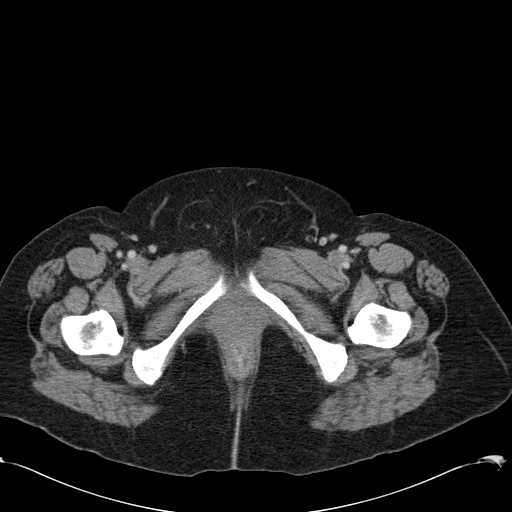
[im 5/94  bone]
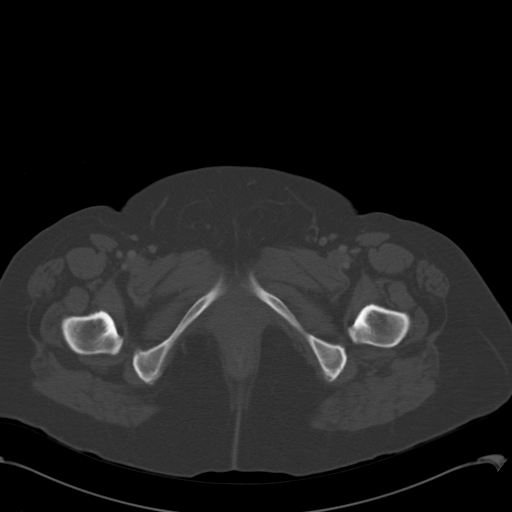
[im 13/94  soft-tissue]
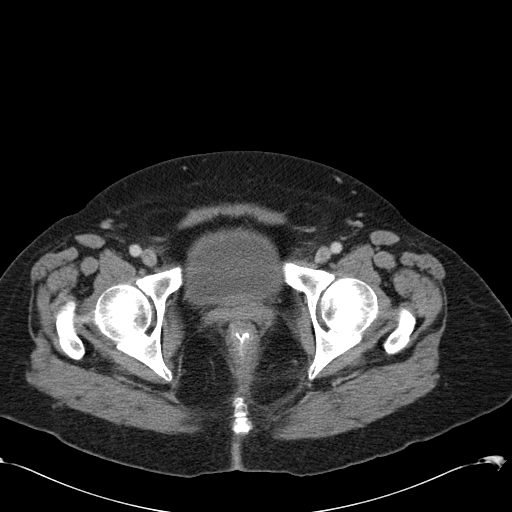
[im 22/94  soft-tissue]
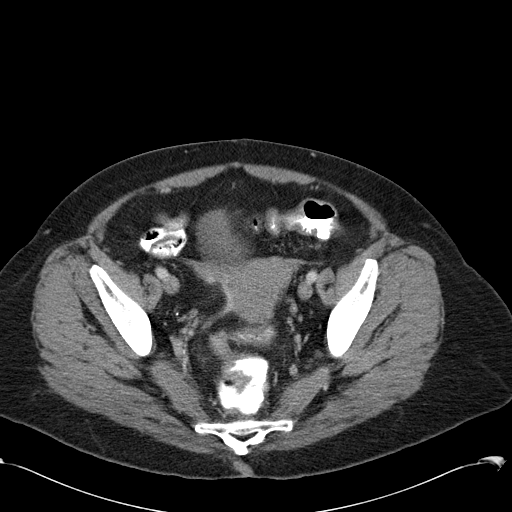
[im 26/94  soft-tissue]
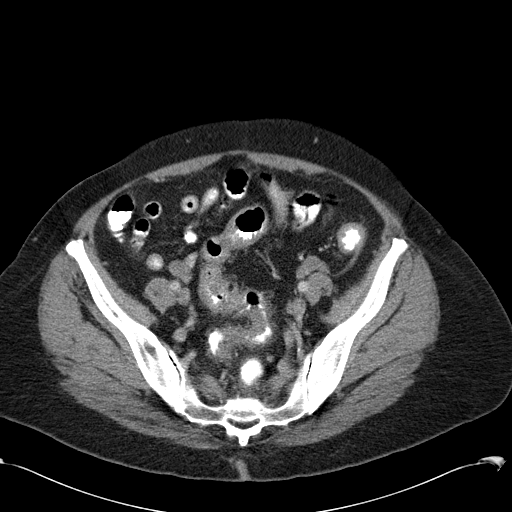
[im 34/94  soft-tissue]
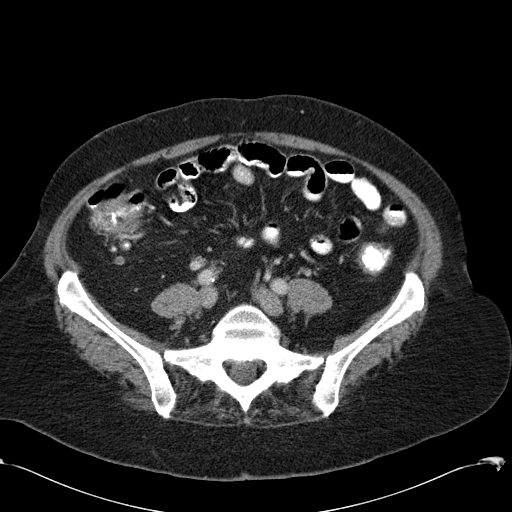
[im 39/94  soft-tissue]
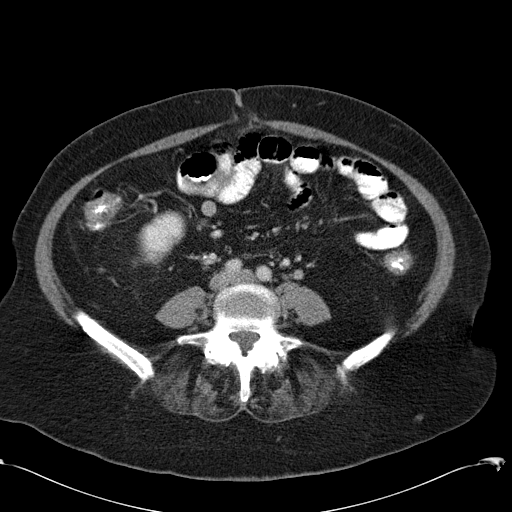
[im 47/94  soft-tissue]
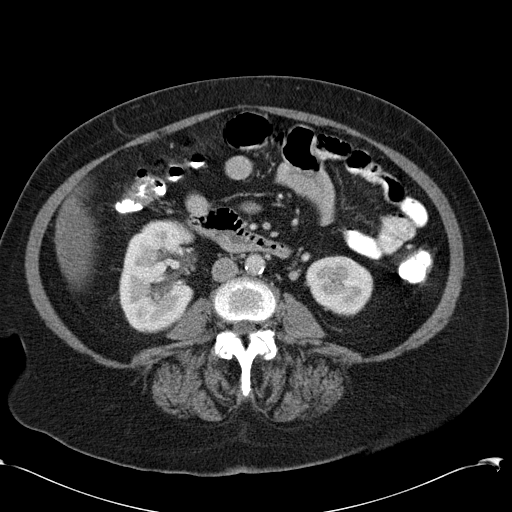
[im 55/94  soft-tissue]
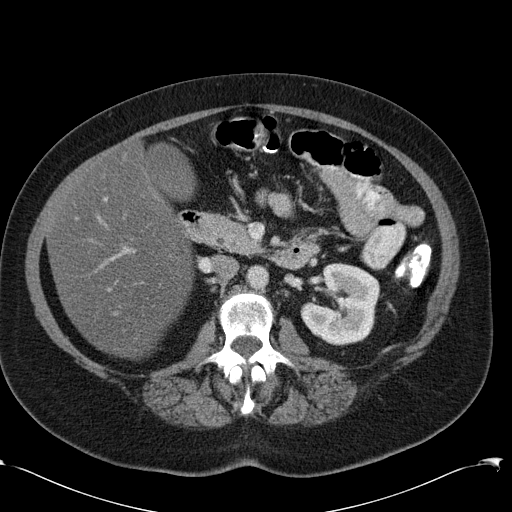
[im 60/94  soft-tissue]
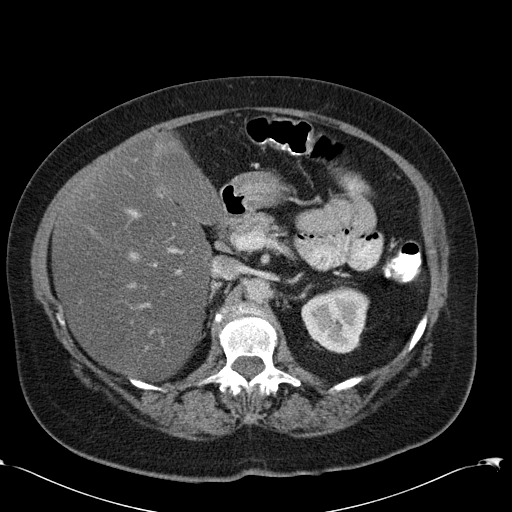
[im 60/94  bone]
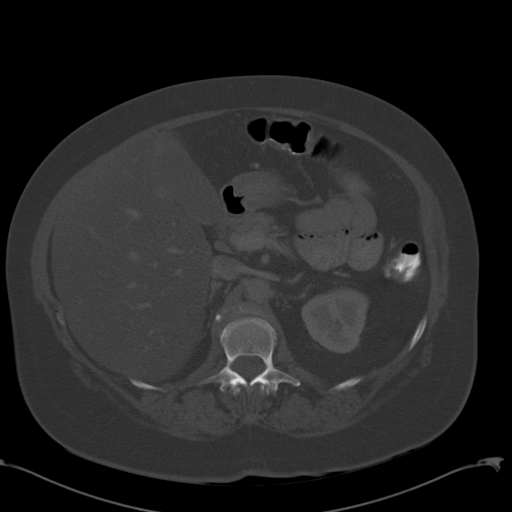
[im 68/94  soft-tissue]
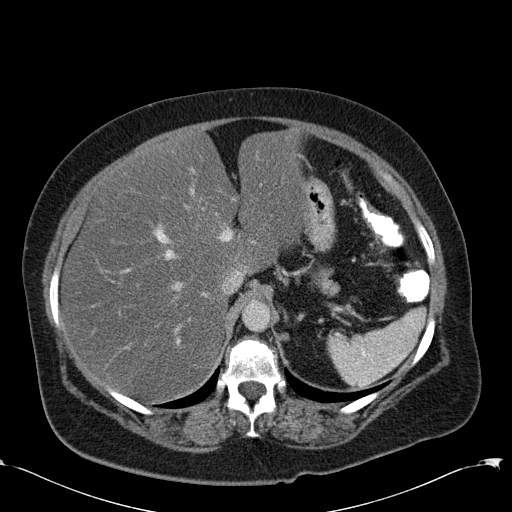
[im 72/94  soft-tissue]
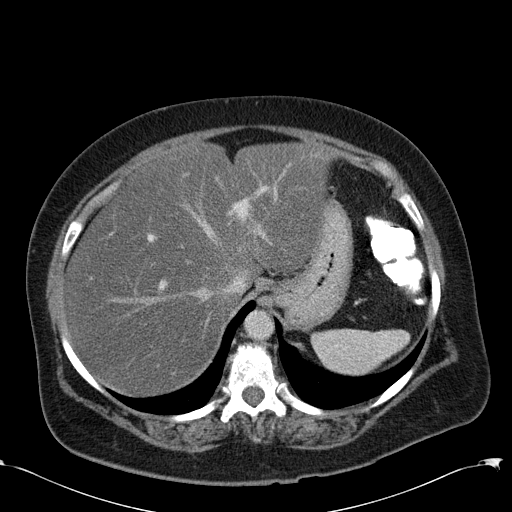
[im 77/94  lung]
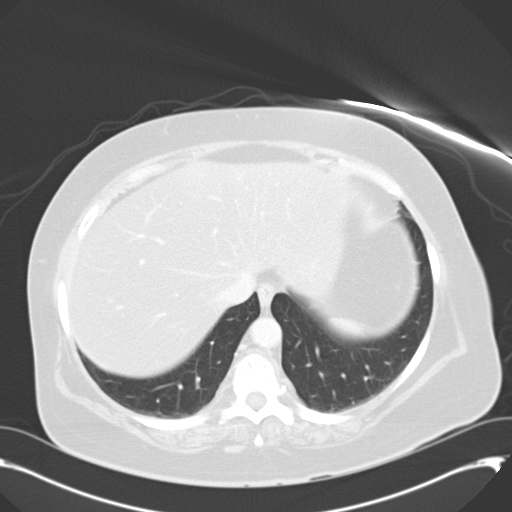
[im 81/94  soft-tissue]
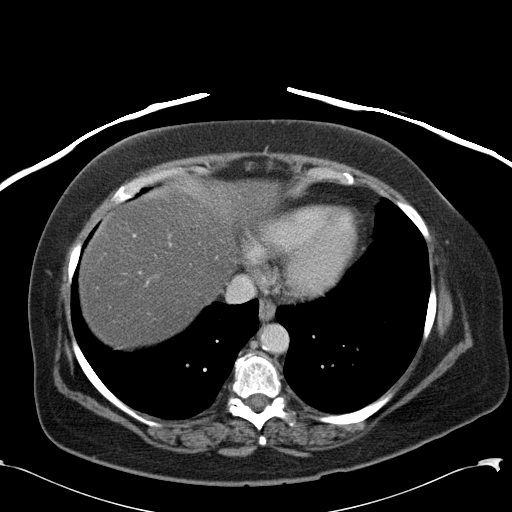
[im 81/94  lung]
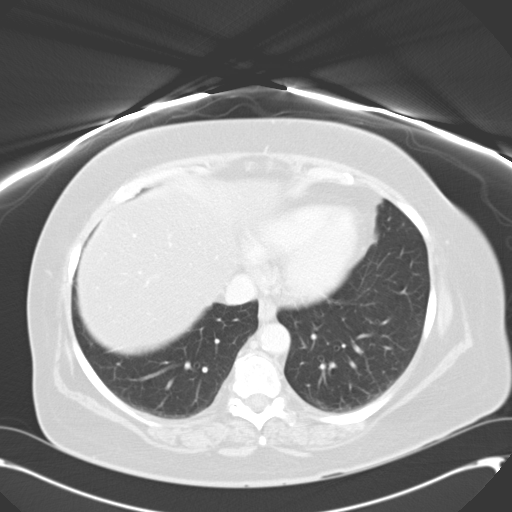
[im 85/94  lung]
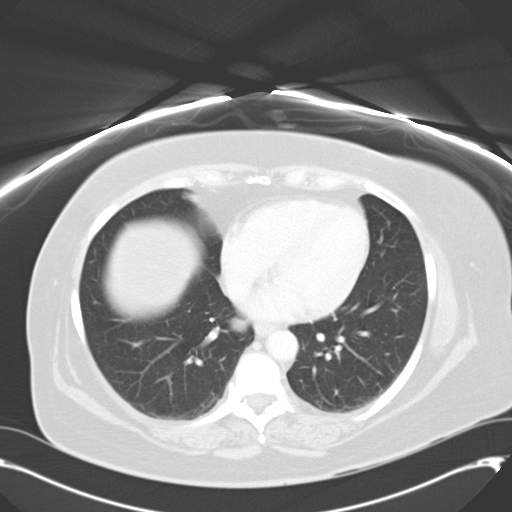
[im 89/94  soft-tissue]
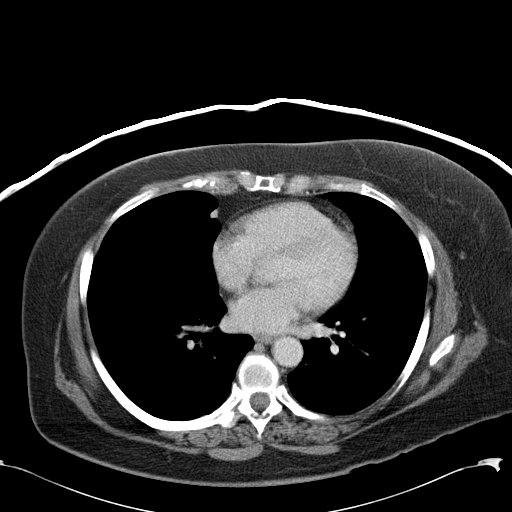
[im 89/94  lung]
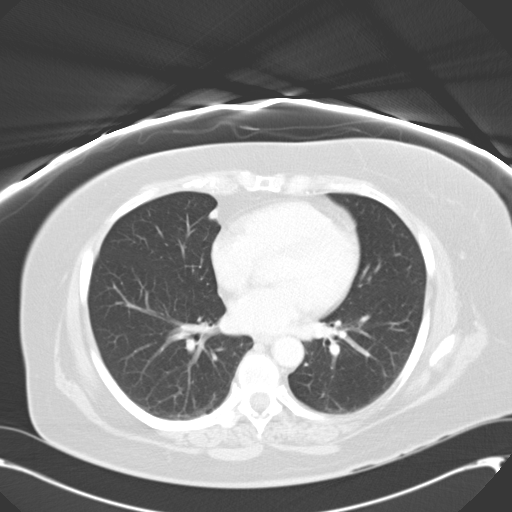

[15 of 32 positions shown; findings below may reference images not displayed]

FINDINGS: Stable chronic densities in the lung bases, likely
scarring.  Heart is normal size.  No effusions.

Stable fatty infiltration of the liver.  No focal lesions
visualized.  Gallbladder, spleen, pancreas, adrenals and kidneys
are unremarkable.

Appendix is visualized and is.  Small bowel is decompressed,
grossly unremarkable.  There are scattered sigmoid diverticula.  No
active diverticulitis.  No free fluid, free air or adenopathy.
Uterus and adnexa are unremarkable at bladder grossly unremarkable

No acute bony abnormality.
IMPRESSION: Stable fatty infiltration of the liver and sigmoid diverticulosis.

No acute findings.

## 2012-08-14 NOTE — Discharge Summary (Signed)
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  Alcohol abuse     .  Diabetes mellitus     .  Hypertension     .  Fibromyalgia     AXIS IV: other psychosocial or environmental problems, problems related to social environment and problems with access to health care services  AXIS V: 61-70 mild symptoms  Level of Care: OP  Hospital Course: [name] was admitted after relapsing on alcohol and benzodiazepines. She reported that she had had root canal, tooth extracted and bridge all in one setting. The Hydrocodone she was given caused nausea and vomiting so she started drinking to relive the pain.  She was treated with the librium protocol. Medical problems were identified and treated. Home medication was restarted as appropriate.  Improvement was monitored by CIWA/COWS scores and patient's daily report of withdrawal symptom reduction. Emotional and mental status was monitored by daily self inventory reports completed by the patient and clinical staff.  The patient was evaluated by the treatment team for stability and plans for continued recovery upon discharge. She was offered further treatment options upon discharge including Residential, IOP, and Outpatient treatment. The patient's motivation was an integral factor for scheduling further treatment. Employment, transportation, bed availability, health status,  family support, and any pending legal issues were also considered.  Upon completion of detox the patient was both mentally and medically stable for discharge.    Follow-up Information     Follow up with Glori Bickers, MD. (pt has apt in Aug)     Contact information:     [address]  [address]  [address] [address]  [facility] [place] [address]  [phone]           Consults: None  Significant Diagnostic Studies: None  Discharge Vitals:  Blood pressure 121/79, pulse 106, temperature 97.7 F (36.5 C), temperature source Oral, resp. rate 18, height 5\' 2"  (1.575 m), weight 67.586 kg (149 lb)..  Mental Status Exam:  See Mental Status Examination and Suicide Risk Assessment completed by Attending Physician prior to discharge.  Discharge destination: Home  Is patient on multiple antipsychotic therapies at discharge: No  Has Patient had three or more failed trials of antipsychotic monotherapy by history: N/A  Recommended Plan for Multiple Antipsychotic Therapies: N/A    Discharge Orders     Future Appointments:  Provider:  Department:  Dept Phone:  Center:     08/22/2012 12:00 PM  [facility]  [facility]  [phone]  [facility]     08/22/2012 1:00 PM  [facility]  [facility]  [phone]  [facility]        Future Orders  Please Complete By  Expires     Diet - low sodium heart healthy       Increase activity slowly       Discharge instructions       Comments:  Take all of your medications as prescribed. Be sure to keep ALL follow up appointments as scheduled. This is to ensure getting your refills on time to avoid any interruption in your medication. If you find that you can not keep your appointment, call the clinic and reschedule. Be sure to tell the nurse if you will need a refill before your appointment.       Medication List  As of 07/24/2012 5:02 PM     TAKE these medications       Indication       aspirin 81 MG chewable tablet        Chew 1 tablet (81 mg total) by mouth daily. For prevention  of stroke and heart attack        citalopram 10 MG tablet   Indication: Depression       Commonly known as: CELEXA        Take 1 tablet (10 mg total) by mouth daily. For depression.        diphenoxylate-atropine 2.5-0.025 MG per tablet        Commonly known as: LOMOTIL        Take 2 tablets by mouth 2 (two) times daily as needed. diarrhea        ezetimibe 10 MG tablet        Commonly known as: ZETIA        Take 1 tablet (10 mg total) by mouth daily. To help lower cholesterol.        Fish Oil 1000 MG Cpdr        Take 4 capsules by mouth at bedtime. For a total of 4000mg  daily for brain rehab, cholesterol control, dry eyes and several other benefits.        lisinopril 5 MG tablet        Commonly known as: PRINIVIL,ZESTRIL        Take 1 tablet (5 mg total) by mouth daily. For control of high blood pressure        metFORMIN 750 MG 24 hr tablet        Commonly known as: GLUCOPHAGE-XR        Take 1 tablet (750 mg total) by mouth 2 (two) times daily. For control of blood sugar        nebivolol 5 MG tablet        Commonly known as: BYSTOLIC        Take 1 tablet (5 mg total) by mouth daily. For control of high blood pressure        thiamine 100 MG tablet        Take 1 tablet (100 mg total) by mouth daily. For nutritional supplementation.            Follow-up Information     Follow up with Glori Bickers, MD. (pt has apt in Aug)     Contact information:     [address]  [address]  [address] [address]  [place] [address]  [phone]    Follow-up recommendations:  Activities: Resume typical activities  Diet: Resume typical diet  Other: Follow up with outpatient provider and report any side effects to out patient prescriber.  Comments: Take all your medications as prescribed by your mental healthcare provider.  Report any adverse effects and or reactions from your medicines to your outpatient provider promptly.  Patient is instructed and cautioned to not engage in  alcohol and or illegal drug use while on prescription medicines.  In the event of worsening symptoms, patient is instructed to call the crisis hotline, 911 and or go to the nearest ED for appropriate evaluation and treatment of symptoms.  Signed:  Rona Ravens. Gloriann Riede PAC  For Dr. Lupe Carney  07/24/2012 5:02 PM

## 2012-08-16 NOTE — Discharge Summary (Signed)
Read and reviewed. 

## 2012-08-22 ENCOUNTER — Other Ambulatory Visit (HOSPITAL_COMMUNITY): Payer: Self-pay

## 2012-08-22 ENCOUNTER — Ambulatory Visit (HOSPITAL_COMMUNITY): Payer: 59 | Attending: Interventional Radiology

## 2012-12-03 ENCOUNTER — Emergency Department (HOSPITAL_COMMUNITY)
Admission: EM | Admit: 2012-12-03 | Discharge: 2012-12-04 | Disposition: A | Discharge status: Home / Self Care | Payer: 59 | Attending: Emergency Medicine | Admitting: Emergency Medicine

## 2012-12-03 ENCOUNTER — Encounter (HOSPITAL_COMMUNITY): Payer: Self-pay | Admitting: Emergency Medicine

## 2012-12-03 DIAGNOSIS — F1010 Alcohol abuse, uncomplicated: Secondary | ICD-10-CM

## 2012-12-03 DIAGNOSIS — Z79899 Other long term (current) drug therapy: Secondary | ICD-10-CM | POA: Insufficient documentation

## 2012-12-03 DIAGNOSIS — F3289 Other specified depressive episodes: Secondary | ICD-10-CM

## 2012-12-03 DIAGNOSIS — IMO0001 Reserved for inherently not codable concepts without codable children: Secondary | ICD-10-CM | POA: Insufficient documentation

## 2012-12-03 DIAGNOSIS — E119 Type 2 diabetes mellitus without complications: Secondary | ICD-10-CM | POA: Insufficient documentation

## 2012-12-03 DIAGNOSIS — F1020 Alcohol dependence, uncomplicated: Secondary | ICD-10-CM

## 2012-12-03 DIAGNOSIS — Z7982 Long term (current) use of aspirin: Secondary | ICD-10-CM | POA: Insufficient documentation

## 2012-12-03 DIAGNOSIS — I10 Essential (primary) hypertension: Secondary | ICD-10-CM | POA: Insufficient documentation

## 2012-12-03 DIAGNOSIS — F17200 Nicotine dependence, unspecified, uncomplicated: Secondary | ICD-10-CM | POA: Insufficient documentation

## 2012-12-03 DIAGNOSIS — F329 Major depressive disorder, single episode, unspecified: Secondary | ICD-10-CM

## 2012-12-03 LAB — COMPREHENSIVE METABOLIC PANEL
ALT: 24 U/L (ref 0–35)
AST: 27 U/L (ref 0–37)
Albumin: 3.6 g/dL (ref 3.5–5.2)
Alkaline Phosphatase: 82 U/L (ref 39–117)
BUN: 16 mg/dL (ref 6–23)
CO2: 29 mEq/L (ref 19–32)
Calcium: 8.4 mg/dL (ref 8.4–10.5)
Chloride: 100 mEq/L (ref 96–112)
Creatinine, Ser: 0.65 mg/dL (ref 0.50–1.10)
GFR calc Af Amer: >90 mL/min (ref >90)
GFR calc non Af Amer: >90 mL/min (ref >90)
Glucose, Bld: 186 mg/dL — ABNORMAL HIGH (ref 70–99)
Potassium: 4.2 mEq/L (ref 3.5–5.1)
Sodium: 139 mEq/L (ref 135–145)
Total Bilirubin: 0.2 mg/dL — ABNORMAL LOW (ref 0.3–1.2)
Total Protein: 7.3 g/dL (ref 6.0–8.3)

## 2012-12-03 LAB — CBC
HCT: 36.5 % (ref 36.0–46.0)
Hemoglobin: 12 g/dL (ref 12.0–15.0)
MCH: 30.5 pg (ref 26.0–34.0)
MCHC: 32.9 g/dL (ref 30.0–36.0)
MCV: 92.6 fL (ref 78.0–100.0)
Platelets: 244 10*3/uL (ref 150–400)
RBC: 3.94 MIL/uL (ref 3.87–5.11)
RDW: 14.2 % (ref 11.5–15.5)
WBC: 4.3 10*3/uL (ref 4.0–10.5)

## 2012-12-03 LAB — ETHANOL: Alcohol, Ethyl (B): 245 mg/dL — ABNORMAL HIGH (ref 0–11)

## 2012-12-03 MED ORDER — LISINOPRIL 10 MG PO TABS
10.0000 mg | ORAL_TABLET | Freq: Every day | ORAL | Status: DC
  Filled 2012-12-03: qty 1

## 2012-12-03 MED ORDER — CITALOPRAM HYDROBROMIDE 20 MG PO TABS
20.0000 mg | ORAL_TABLET | Freq: Every day | ORAL | Status: DC
  Filled 2012-12-03: qty 1

## 2012-12-03 MED ORDER — IBUPROFEN 600 MG PO TABS: 600.0000 mg | ORAL_TABLET | Freq: Three times a day (TID) | ORAL | Status: DC | PRN

## 2012-12-03 MED ORDER — FOLIC ACID 1 MG PO TABS: 1.0000 mg | ORAL_TABLET | Freq: Every day | ORAL | Status: DC

## 2012-12-03 MED ORDER — ASPIRIN 81 MG PO CHEW: 81.0000 mg | CHEWABLE_TABLET | Freq: Every day | ORAL | Status: DC

## 2012-12-03 MED ORDER — LORAZEPAM 1 MG PO TABS: 1.0000 mg | ORAL_TABLET | Freq: Four times a day (QID) | ORAL | Status: DC | PRN

## 2012-12-03 MED ORDER — ZOLPIDEM TARTRATE 5 MG PO TABS: 5.0000 mg | ORAL_TABLET | Freq: Every evening | ORAL | Status: DC | PRN

## 2012-12-03 MED ORDER — NEBIVOLOL HCL 5 MG PO TABS
5.0000 mg | ORAL_TABLET | Freq: Every day | ORAL | Status: DC
  Filled 2012-12-03: qty 1

## 2012-12-03 MED ORDER — VITAMIN B-1 100 MG PO TABS: 100.0000 mg | ORAL_TABLET | Freq: Every day | ORAL | Status: DC

## 2012-12-03 MED ORDER — LORAZEPAM 1 MG PO TABS: 0.0000 mg | ORAL_TABLET | Freq: Two times a day (BID) | ORAL | Status: DC

## 2012-12-03 MED ORDER — ACETAMINOPHEN 325 MG PO TABS: 650.0000 mg | ORAL_TABLET | ORAL | Status: DC | PRN

## 2012-12-03 MED ORDER — ADULT MULTIVITAMIN W/MINERALS CH: 1.0000 | ORAL_TABLET | Freq: Every day | ORAL | Status: DC

## 2012-12-03 MED ORDER — THIAMINE HCL 100 MG/ML IJ SOLN: 100.0000 mg | Freq: Every day | INTRAMUSCULAR | Status: DC

## 2012-12-03 MED ORDER — LORAZEPAM 2 MG/ML IJ SOLN: 1.0000 mg | Freq: Four times a day (QID) | INTRAMUSCULAR | Status: DC | PRN

## 2012-12-03 MED ORDER — EZETIMIBE 10 MG PO TABS
10.0000 mg | ORAL_TABLET | Freq: Every day | ORAL | Status: DC
  Filled 2012-12-03: qty 1

## 2012-12-03 MED ORDER — DIPHENOXYLATE-ATROPINE 2.5-0.025 MG PO TABS: 2.0000 | ORAL_TABLET | Freq: Two times a day (BID) | ORAL | Status: DC | PRN

## 2012-12-03 MED ORDER — ONDANSETRON HCL 4 MG PO TABS
4.0000 mg | ORAL_TABLET | Freq: Three times a day (TID) | ORAL | Status: DC | PRN
  Administered 2012-12-04: 4 mg via ORAL
  Filled 2012-12-03: qty 1

## 2012-12-03 MED ORDER — GABAPENTIN 300 MG PO CAPS
300.0000 mg | ORAL_CAPSULE | Freq: Every day | ORAL | Status: DC
  Administered 2012-12-04: 300 mg via ORAL
  Filled 2012-12-03 (×3): qty 1

## 2012-12-03 MED ORDER — LORAZEPAM 1 MG PO TABS
0.0000 mg | ORAL_TABLET | Freq: Four times a day (QID) | ORAL | Status: DC
  Administered 2012-12-04: 1 mg via ORAL
  Filled 2012-12-03: qty 1

## 2012-12-03 MED ORDER — ALUM & MAG HYDROXIDE-SIMETH 200-200-20 MG/5ML PO SUSP: 30.0000 mL | ORAL | Status: DC | PRN

## 2012-12-03 NOTE — ED Notes (Signed)
Pt brought in by sheriff under IVC papers  Paperwork states that she wants to kill herself just like her son did so she can be with him  Officer states her son killed himself while on the phone with her  Pt is intoxicated upon arrival to the ED  Pt is tearful in triage

## 2012-12-03 NOTE — ED Notes (Signed)
MD at bedside. 

## 2012-12-04 ENCOUNTER — Encounter (HOSPITAL_COMMUNITY): Payer: Self-pay

## 2012-12-04 LAB — RAPID URINE DRUG SCREEN, HOSP PERFORMED
Amphetamines: NOT DETECTED
Barbiturates: NOT DETECTED
Benzodiazepines: POSITIVE — AB
Cocaine: NOT DETECTED
Opiates: NOT DETECTED
Tetrahydrocannabinol: NOT DETECTED

## 2012-12-04 NOTE — ED Provider Notes (Signed)
History     CSN: [id number]  Arrival date & time 12/03/12  2233   First MD Initiated Contact with Patient 12/03/12 2302      Chief Complaint  Patient presents with  . Medical Clearance    (Consider location/radiation/quality/duration/timing/severity/associated sxs/prior treatment) HPI 66 year old female presents to the emergency department under IVC paperwork with reported suicidal threat. For IVC paperwork, patient made comment that she was going to kill herself just like her son did. Patient reports her son shot himself in the head 10 years ago while talking to her on the phone. She reports she has recently had a relapse in her alcoholism, and has been on the binge for last 2 weeks. She had 4 days today at  [facility] last week, and was discharged on Friday. She saw her psychiatrist on Saturday, and has followup with her therapist this week. Patient reports she is still drinking, but not to the extent that she was prior to her old [facility] stay. She denies any SI HI. She has not had any withdrawal symptoms. She reports she's under a lot of social stress right now as her husband of 24 years left her 2 weeks ago. Her daughter who has substance-abuse issues is living with her with 2 small grandchildren. For the holidays coming up, she reports she's been more down, and upset about her husband leaving. He denies previous suicidal attempt. 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 Alcohol abuse   . Diabetes mellitus   . Hypertension   . Fibromyalgia     Past Surgical History  Procedure Date  . Cesarean section   . Tonsillectomy   . Bunionectomy     History reviewed. No pertinent family history.  History  Substance Use Topics  . Smoking status: Current Some Day Smoker    Last Attempt to Quit: 03/11/2010  . Smokeless tobacco: Never Used  . Alcohol Use: Yes     Comment: 4-5 bottles of wine per day    OB History    Grav Para Term  Preterm Abortions TAB SAB Ect Mult Living                  Review of Systems  All other systems reviewed and are negative.    Allergies  Other; Latex; Lipitor; Pentazocine lactate; Codeine; and Sulfonamide derivatives  Home Medications   Current Outpatient Rx  Name  Route  Sig  Dispense  Refill  . ASPIRIN 81 MG PO CHEW   Oral   Chew 1 tablet (81 mg total) by mouth daily. For prevention of stroke and heart attack   30 tablet   0   . CITALOPRAM HYDROBROMIDE 10 MG PO TABS   Oral   Take 20 mg by mouth daily. For depression.         Marland Kitchen EZETIMIBE 10 MG PO TABS   Oral   Take 1 tablet (10 mg total) by mouth daily. To help lower cholesterol.   30 tablet   0   . GABAPENTIN 300 MG PO CAPS   Oral   Take 300 mg by mouth at bedtime.         Marland Kitchen HYDROCODONE-ACETAMINOPHEN 5-325 MG PO TABS   Oral   Take 1 tablet by mouth every 6 (six) hours as needed. pain         . LISINOPRIL 5 MG PO TABS   Oral   Take 10 mg by mouth daily. For  control of high blood pressure         . NEBIVOLOL HCL 5 MG PO TABS   Oral   Take 1 tablet (5 mg total) by mouth daily. For control of high blood pressure   30 tablet   0   . FISH OIL 1000 MG PO CPDR   Oral   Take 4 capsules by mouth at bedtime. For a total of 4000mg  daily for brain rehab, cholesterol control, dry eyes and several other benefits.   120 capsule   0   . DIPHENOXYLATE-ATROPINE 2.5-0.025 MG PO TABS   Oral   Take 2 tablets by mouth 2 (two) times daily as needed. diarrhea            BP 147/72  Pulse 93  Temp 98.2 F (36.8 C) (Oral)  Resp 20  SpO2 93%  Physical Exam  Nursing note and vitals reviewed. Constitutional: She is oriented to person, place, and time. She appears well-developed and well-nourished. She appears distressed (tearful).  HENT:  Head: Normocephalic and atraumatic.  Right Ear: External ear normal.  Left Ear: External ear normal.  Nose: Nose normal.  Mouth/Throat: Oropharynx is clear and moist.   Eyes: Conjunctivae normal and EOM are normal. Pupils are equal, round, and reactive to light.  Neck: Normal range of motion. Neck supple. No JVD present. No tracheal deviation present. No thyromegaly present.  Cardiovascular: Normal rate, regular rhythm, normal heart sounds and intact distal pulses.  Exam reveals no gallop and no friction rub.   No murmur heard. Pulmonary/Chest: Effort normal and breath sounds normal. No stridor. No respiratory distress. She has no wheezes. She has no rales. She exhibits no tenderness.  Abdominal: Soft. Bowel sounds are normal. She exhibits no distension and no mass. There is no tenderness. There is no rebound and no guarding.  Musculoskeletal: Normal range of motion. She exhibits no edema and no tenderness.  Lymphadenopathy:    She has no cervical adenopathy.  Neurological: She is alert and oriented to person, place, and time. She exhibits normal muscle tone. Coordination normal.  Skin: Skin is warm and dry. No rash noted. No erythema. No pallor.  Psychiatric: Her behavior is normal. Judgment and thought content normal.       Sad mood but denies si    ED Course  Procedures (including critical care time)  Labs Reviewed  COMPREHENSIVE METABOLIC PANEL - Abnormal; Notable for the following:    Glucose, Bld 186 (*)     Total Bilirubin 0.2 (*)     All other components within normal limits  ETHANOL - Abnormal; Notable for the following:    Alcohol, Ethyl (B) 245 (*)     All other components within normal limits  CBC  URINE RAPID DRUG SCREEN (HOSP PERFORMED)   No results found.   1. Alcohol abuse   2. DIABETES MELLITUS, TYPE II   3. ALCOHOLISM   4. DEPRESSION       MDM  66 year old female with IVC paperwork to 2 suicidal threats. Patient denies SI. Patient noted to have blood alcohol 245,, however she does not appear intoxicated. Will have telepsych evaluated.   3:11 AM Telepsych has been completed, reversal commitment suggested. Dr. Jacky Kindle  reports he sees no evidence of delusional thought process or cognitive impairment, as she does not have the vegetative signs and symptoms associated with depression, and does not meet IVC criteria at this time. Once patient is able to get a ride home, we will discharge her.  Olivia Mackie, MD 12/04/12 [phone]

## 2012-12-04 NOTE — [facility] Counselor (Signed)
Telepsych consult recommends discharge and Dr. Jacky Kindle from specialists on call reversed IVC paperwork. RN and EDP aware. Pt denies SI and HI. No psychosis present. Pt able to contract for safety. She will be driven to her house by sheriff's deputy.

## 2012-12-04 NOTE — ED Notes (Signed)
Called and faxed telepsych paperwork.

## 2013-02-08 ENCOUNTER — Encounter (HOSPITAL_COMMUNITY): Payer: Self-pay | Admitting: *Deleted

## 2013-02-08 ENCOUNTER — Emergency Department (HOSPITAL_COMMUNITY)
Admission: EM | Admit: 2013-02-08 | Discharge: 2013-02-09 | Disposition: A | Discharge status: Home / Self Care | Payer: 59 | Attending: Emergency Medicine | Admitting: Emergency Medicine

## 2013-02-08 DIAGNOSIS — Z7982 Long term (current) use of aspirin: Secondary | ICD-10-CM | POA: Insufficient documentation

## 2013-02-08 DIAGNOSIS — F10239 Alcohol dependence with withdrawal, unspecified: Secondary | ICD-10-CM | POA: Insufficient documentation

## 2013-02-08 DIAGNOSIS — R1110 Vomiting, unspecified: Secondary | ICD-10-CM

## 2013-02-08 DIAGNOSIS — R197 Diarrhea, unspecified: Secondary | ICD-10-CM | POA: Insufficient documentation

## 2013-02-08 DIAGNOSIS — F1010 Alcohol abuse, uncomplicated: Secondary | ICD-10-CM | POA: Insufficient documentation

## 2013-02-08 DIAGNOSIS — E119 Type 2 diabetes mellitus without complications: Secondary | ICD-10-CM | POA: Insufficient documentation

## 2013-02-08 DIAGNOSIS — Z79899 Other long term (current) drug therapy: Secondary | ICD-10-CM | POA: Insufficient documentation

## 2013-02-08 DIAGNOSIS — IMO0001 Reserved for inherently not codable concepts without codable children: Secondary | ICD-10-CM | POA: Insufficient documentation

## 2013-02-08 DIAGNOSIS — F10939 Alcohol use, unspecified with withdrawal, unspecified: Secondary | ICD-10-CM

## 2013-02-08 DIAGNOSIS — I10 Essential (primary) hypertension: Secondary | ICD-10-CM | POA: Insufficient documentation

## 2013-02-08 DIAGNOSIS — F17200 Nicotine dependence, unspecified, uncomplicated: Secondary | ICD-10-CM | POA: Insufficient documentation

## 2013-02-08 LAB — COMPREHENSIVE METABOLIC PANEL
ALT: 24 U/L (ref 0–35)
AST: 33 U/L (ref 0–37)
Albumin: 4.1 g/dL (ref 3.5–5.2)
Alkaline Phosphatase: 102 U/L (ref 39–117)
BUN: 25 mg/dL — ABNORMAL HIGH (ref 6–23)
CO2: 23 mEq/L (ref 19–32)
Calcium: 8.7 mg/dL (ref 8.4–10.5)
Chloride: 93 mEq/L — ABNORMAL LOW (ref 96–112)
Creatinine, Ser: 0.71 mg/dL (ref 0.50–1.10)
GFR calc Af Amer: >90 mL/min (ref >90)
GFR calc non Af Amer: 89 mL/min — ABNORMAL LOW (ref >90)
Glucose, Bld: 121 mg/dL — ABNORMAL HIGH (ref 70–99)
Potassium: 4.2 mEq/L (ref 3.5–5.1)
Sodium: 132 mEq/L — ABNORMAL LOW (ref 135–145)
Total Bilirubin: 0.6 mg/dL (ref 0.3–1.2)
Total Protein: 8 g/dL (ref 6.0–8.3)

## 2013-02-08 LAB — CBC WITH DIFFERENTIAL/PLATELET
Basophils Absolute: 0 10*3/uL (ref 0.0–0.1)
Basophils Relative: 0 % (ref 0–1)
Eosinophils Absolute: 0 10*3/uL (ref 0.0–0.7)
Eosinophils Relative: 0 % (ref 0–5)
HCT: 38.8 % (ref 36.0–46.0)
Hemoglobin: 13.4 g/dL (ref 12.0–15.0)
Lymphocytes Relative: 19 % (ref 12–46)
Lymphs Abs: 1.7 10*3/uL (ref 0.7–4.0)
MCH: 31.8 pg (ref 26.0–34.0)
MCHC: 34.5 g/dL (ref 30.0–36.0)
MCV: 92.2 fL (ref 78.0–100.0)
Monocytes Absolute: 0.7 10*3/uL (ref 0.1–1.0)
Monocytes Relative: 7 % (ref 3–12)
Neutro Abs: 6.7 10*3/uL (ref 1.7–7.7)
Neutrophils Relative %: 73 % (ref 43–77)
Platelets: 253 10*3/uL (ref 150–400)
RBC: 4.21 MIL/uL (ref 3.87–5.11)
RDW: 14.6 % (ref 11.5–15.5)
WBC: 9.2 10*3/uL (ref 4.0–10.5)

## 2013-02-08 LAB — ETHANOL: Alcohol, Ethyl (B): <11 mg/dL (ref 0–11)

## 2013-02-08 MED ORDER — PROMETHAZINE HCL 25 MG PO TABS: 25.0000 mg | ORAL_TABLET | Freq: Four times a day (QID) | ORAL | Status: DC | PRN

## 2013-02-08 MED ORDER — THIAMINE HCL 100 MG/ML IJ SOLN: 100.0000 mg | Freq: Every day | INTRAMUSCULAR | Status: DC

## 2013-02-08 MED ORDER — LORAZEPAM 1 MG PO TABS
1.0000 mg | ORAL_TABLET | Freq: Four times a day (QID) | ORAL | Status: DC | PRN
  Administered 2013-02-08: 1 mg via ORAL
  Filled 2013-02-08: qty 1

## 2013-02-08 MED ORDER — LOPERAMIDE HCL 2 MG PO CAPS: 4.0000 mg | ORAL_CAPSULE | Freq: Once | ORAL | Status: DC

## 2013-02-08 MED ORDER — ONDANSETRON 8 MG PO TBDP: 8.0000 mg | ORAL_TABLET | Freq: Four times a day (QID) | ORAL | Status: DC | PRN

## 2013-02-08 MED ORDER — LOPERAMIDE HCL 2 MG PO CAPS
4.0000 mg | ORAL_CAPSULE | Freq: Once | ORAL | Status: AC
  Administered 2013-02-08: 4 mg via ORAL
  Filled 2013-02-08: qty 1

## 2013-02-08 MED ORDER — SODIUM CHLORIDE 0.9 % IV BOLUS (SEPSIS): 1000.0000 mL | Freq: Once | INTRAVENOUS | Status: DC

## 2013-02-08 MED ORDER — ONDANSETRON HCL 4 MG/2ML IJ SOLN
4.0000 mg | INTRAMUSCULAR | Status: DC | PRN
  Administered 2013-02-08: 4 mg via INTRAVENOUS
  Filled 2013-02-08: qty 2

## 2013-02-08 MED ORDER — LORAZEPAM 2 MG/ML IJ SOLN: 1.0000 mg | Freq: Four times a day (QID) | INTRAMUSCULAR | Status: DC | PRN

## 2013-02-08 MED ORDER — VITAMIN B-1 100 MG PO TABS
100.0000 mg | ORAL_TABLET | Freq: Every day | ORAL | Status: DC
  Administered 2013-02-08: 100 mg via ORAL
  Filled 2013-02-08: qty 1

## 2013-02-08 MED ORDER — SODIUM CHLORIDE 0.9 % IV SOLN
1000.0000 mL | Freq: Once | INTRAVENOUS | Status: AC
  Administered 2013-02-08: 1000 mL via INTRAVENOUS

## 2013-02-08 MED ORDER — FOLIC ACID 1 MG PO TABS
1.0000 mg | ORAL_TABLET | Freq: Every day | ORAL | Status: DC
  Administered 2013-02-08: 1 mg via ORAL
  Filled 2013-02-08: qty 1

## 2013-02-08 MED ORDER — ADULT MULTIVITAMIN W/MINERALS CH
1.0000 | ORAL_TABLET | Freq: Every day | ORAL | Status: DC
  Administered 2013-02-08: 1 via ORAL
  Filled 2013-02-08: qty 1

## 2013-02-08 MED ORDER — SODIUM CHLORIDE 0.9 % IV SOLN: 1000.0000 mL | INTRAVENOUS | Status: DC

## 2013-02-08 NOTE — ED Notes (Signed)
Patient states her last alcohol was this morning.

## 2013-02-08 NOTE — ED Provider Notes (Signed)
History     CSN: [id number]  Arrival date & time 02/08/13  2038   First MD Initiated Contact with Patient 02/08/13 2044      Chief Complaint  Patient presents with  . Emesis  . Alcohol Intoxication    (Consider location/radiation/quality/duration/timing/severity/associated sxs/prior treatment) HPI  Patient reports she was admitted to [facility] today for chronic alcoholism. She reports she was at [facility] recently and when discharged tried to "taper myself off alcohol" at home without success. She states she was having problems with elevated blood pressure and pulse and after eating dinner tonight she started having nausea, vomiting dry heaves and has had about 6-8 episodes of diarrhea so they sent her to the emergency department. She states she's been drinking 12 small wine cooler bottles a day for at least the past 2 weeks. She had been sober for 9 months until she started drinking again last summer. She states she has been sober as long as 6 and 4 years in the past.  PCP at [facility]   Past Medical History  Diagnosis Date  . Alcohol abuse   . Diabetes mellitus   . Hypertension   . Fibromyalgia     Past Surgical History  Procedure Laterality Date  . Cesarean section    . Tonsillectomy    . Bunionectomy      History reviewed. No pertinent family history.  History  Substance Use Topics  . Smoking status: Current Some Day Smoker    Last Attempt to Quit: 03/11/2010  . Smokeless tobacco: Never Used  . Alcohol Use: Yes     Comment: 4-5 bottles of wine per day   retired nurse  OB History   Grav Para Term Preterm Abortions TAB SAB Ect Mult Living                  Review of Systems  All other systems reviewed and are negative.    Allergies  Other; Latex; Lipitor; Pentazocine lactate; Codeine; and Sulfonamide derivatives  Home Medications   Current Outpatient Rx  Name  Route  Sig  Dispense  Refill  . aspirin 81 MG chewable tablet   Oral   Chew 1 tablet  (81 mg total) by mouth daily. For prevention of stroke and heart attack   30 tablet   0   . diphenoxylate-atropine (LOMOTIL) 2.5-0.025 MG per tablet   Oral   Take 2 tablets by mouth 2 (two) times daily as needed. diarrhea          . ezetimibe (ZETIA) 10 MG tablet   Oral   Take 1 tablet (10 mg total) by mouth daily. To help lower cholesterol.   30 tablet   0   . gabapentin (NEURONTIN) 800 MG tablet   Oral   Take 800 mg by mouth 4 (four) times daily.         Marland Kitchen HYDROcodone-acetaminophen (NORCO/VICODIN) 5-325 MG per tablet   Oral   Take 1 tablet by mouth every 6 (six) hours as needed. pain         . hydrOXYzine (VISTARIL) 25 MG capsule   Oral   Take 25 mg by mouth 3 (three) times daily as needed (anxiety).         Marland Kitchen lisinopril (PRINIVIL,ZESTRIL) 5 MG tablet   Oral   Take 10 mg by mouth daily. For control of high blood pressure         . Omega-3 Fatty Acids (FISH OIL) 1000 MG CPDR   Oral  Take 4 capsules by mouth at bedtime. For a total of 4000mg  daily for brain rehab, cholesterol control, dry eyes and several other benefits.   120 capsule   0   . traZODone (DESYREL) 50 MG tablet   Oral   Take 50-200 mg by mouth at bedtime.           BP 159/91  Pulse 117  Temp(Src) 98.7 F (37.1 C) (Oral)  Resp 20  SpO2 96%  Vital signs normal except for hypertension and tachycardia   Physical Exam  Nursing note and vitals reviewed. Constitutional: She is oriented to person, place, and time. She appears well-developed and well-nourished.  Non-toxic appearance. She does not appear ill. No distress.  HENT:  Head: Normocephalic and atraumatic.  Right Ear: External ear normal.  Left Ear: External ear normal.  Nose: Nose normal. No mucosal edema or rhinorrhea.  Mouth/Throat: Mucous membranes are normal. No dental abscesses or edematous.  Dry mucous membranes  Eyes: Conjunctivae and EOM are normal. Pupils are equal, round, and reactive to light.  Neck: Normal range of  motion and full passive range of motion without pain. Neck supple.  Cardiovascular: Normal rate, regular rhythm and normal heart sounds.  Exam reveals no gallop and no friction rub.   No murmur heard. Pulmonary/Chest: Effort normal and breath sounds normal. No respiratory distress. She has no wheezes. She has no rhonchi. She has no rales. She exhibits no tenderness and no crepitus.  Abdominal: Soft. Normal appearance and bowel sounds are normal. She exhibits no distension. There is tenderness. There is no rebound and no guarding.  Mild diffuse tenderness  Musculoskeletal: Normal range of motion. She exhibits no edema and no tenderness.  Moves all extremities well.   Neurological: She is alert and oriented to person, place, and time. She has normal strength. No cranial nerve deficit.  Patient having tremors of her extremities and her tongue  Skin: Skin is warm, dry and intact. No rash noted. No erythema. No pallor.  Psychiatric: She has a normal mood and affect. Her speech is normal and behavior is normal. Her mood appears not anxious.    ED Course  Procedures (including critical care time)  Medications  LORazepam (ATIVAN) tablet 1 mg (1 mg Oral Given 02/08/13 2218)    Or  LORazepam (ATIVAN) injection 1 mg ( Intravenous See Alternative 02/08/13 2218)  thiamine (VITAMIN B-1) tablet 100 mg (100 mg Oral Given 02/08/13 2217)    Or  thiamine (B-1) injection 100 mg ( Intravenous See Alternative 02/08/13 2217)  folic acid (FOLVITE) tablet 1 mg (1 mg Oral Given 02/08/13 2217)  multivitamin with minerals tablet 1 tablet (1 tablet Oral Given 02/08/13 2217)  0.9 %  sodium chloride infusion (1,000 mLs Intravenous New Bag/Given 02/08/13 2213)    Followed by  0.9 %  sodium chloride infusion (not administered)  ondansetron (ZOFRAN) injection 4 mg (4 mg Intravenous Given 02/08/13 2212)  sodium chloride 0.9 % bolus 1,000 mL (not administered)  loperamide (IMODIUM) capsule 4 mg (not administered)  loperamide  (IMODIUM) capsule 4 mg (4 mg Oral Given 02/08/13 2218)   Recheck 11:30 tremor is much improved, nausea is gone. Still having some diarrhea, will try imodium. States she thinks she can be discharged back to [facility].   Results for orders placed during the hospital encounter of 02/08/13  CBC WITH DIFFERENTIAL      Result Value Range   WBC 9.2  4.0 - 10.5 K/uL   RBC 4.21  3.87 -  5.11 MIL/uL   Hemoglobin 13.4  12.0 - 15.0 g/dL   HCT 16.1  09.6 - 04.5 %   MCV 92.2  78.0 - 100.0 fL   MCH 31.8  26.0 - 34.0 pg   MCHC 34.5  30.0 - 36.0 g/dL   RDW 40.9  81.1 - 91.4 %   Platelets 253  150 - 400 K/uL   Neutrophils Relative 73  43 - 77 %   Neutro Abs 6.7  1.7 - 7.7 K/uL   Lymphocytes Relative 19  12 - 46 %   Lymphs Abs 1.7  0.7 - 4.0 K/uL   Monocytes Relative 7  3 - 12 %   Monocytes Absolute 0.7  0.1 - 1.0 K/uL   Eosinophils Relative 0  0 - 5 %   Eosinophils Absolute 0.0  0.0 - 0.7 K/uL   Basophils Relative 0  0 - 1 %   Basophils Absolute 0.0  0.0 - 0.1 K/uL  COMPREHENSIVE METABOLIC PANEL      Result Value Range   Sodium 132 (*) 135 - 145 mEq/L   Potassium 4.2  3.5 - 5.1 mEq/L   Chloride 93 (*) 96 - 112 mEq/L   CO2 23  19 - 32 mEq/L   Glucose, Bld 121 (*) 70 - 99 mg/dL   BUN 25 (*) 6 - 23 mg/dL   Creatinine, Ser 7.82  0.50 - 1.10 mg/dL   Calcium 8.7  8.4 - 95.6 mg/dL   Total Protein 8.0  6.0 - 8.3 g/dL   Albumin 4.1  3.5 - 5.2 g/dL   AST 33  0 - 37 U/L   ALT 24  0 - 35 U/L   Alkaline Phosphatase 102  39 - 117 U/L   Total Bilirubin 0.6  0.3 - 1.2 mg/dL   GFR calc non Af Amer 89 (*) >90 mL/min   GFR calc Af Amer >90  >90 mL/min  ETHANOL      Result Value Range   Alcohol, Ethyl (B) <11  0 - 11 mg/dL   Laboratory interpretation all normal except mild hyponatremia, low chloride, elevated BUN, mild hyperglycemia    1. Alcohol abuse   2. Alcohol withdrawal   3. Vomiting and diarrhea     New Prescriptions   ONDANSETRON (ZOFRAN ODT) 8 MG DISINTEGRATING TABLET    Take  1 tablet (8 mg total) by mouth every 6 (six) hours as needed for nausea.   PROMETHAZINE (PHENERGAN) 25 MG TABLET    Take 1 tablet (25 mg total) by mouth every 6 (six) hours as needed for nausea.     Plan discharge  Devoria Albe, MD, FACEP   MDM          Ward Givens, MD 02/08/13 2350

## 2013-02-08 NOTE — ED Notes (Signed)
JWJ:[id number]<NW> Expected date:02/08/13<BR> Expected time: 8:25 PM<BR> Means of arrival:Ambulance<BR> Comments:<BR> N/V detox

## 2013-02-08 NOTE — ED Notes (Signed)
Patient is alert and oriented x3.  She started having vomiting this afternoon.   Patient states that she does have an alcohol problem.

## 2013-02-09 LAB — URINALYSIS, ROUTINE W REFLEX MICROSCOPIC
Bilirubin Urine: NEGATIVE
Glucose, UA: NEGATIVE mg/dL
Ketones, ur: NEGATIVE mg/dL
Nitrite: POSITIVE — AB
Protein, ur: 30 mg/dL — AB
Specific Gravity, Urine: 1.026 (ref 1.005–1.030)
Urobilinogen, UA: 0.2 mg/dL (ref 0.0–1.0)
pH: 5 (ref 5.0–8.0)

## 2013-02-09 LAB — URINE MICROSCOPIC-ADD ON

## 2013-02-09 NOTE — ED Notes (Signed)
Patient is alert and oriented x3.  She was given DC instructions and follow up visit instructions.  Patient gave verbal understanding. She was DC ambulatory under her own power to home.  V/S stable.  SHe was not showing any signs of distress on DC 

## 2013-02-11 LAB — URINE CULTURE: Colony Count: >=100000

## 2013-02-15 NOTE — ED Notes (Signed)
Chart returned from EDP office .Start Keflex 500 mg QID x 7 days written by Roxy Horseman

## 2013-02-17 ENCOUNTER — Telehealth (HOSPITAL_COMMUNITY): Payer: Self-pay | Admitting: Emergency Medicine

## 2013-02-18 ENCOUNTER — Telehealth (HOSPITAL_COMMUNITY): Payer: Self-pay | Admitting: Emergency Medicine

## 2013-02-19 NOTE — ED Notes (Signed)
Unable to contact via phone,letter sent to EDP office for review.

## 2013-03-19 ENCOUNTER — Telehealth (HOSPITAL_COMMUNITY): Payer: Self-pay | Admitting: Emergency Medicine

## 2013-03-19 NOTE — ED Notes (Signed)
No response to letter sent after 30 days. Chart sent to Medical Records. °

## 2013-04-20 ENCOUNTER — Emergency Department (HOSPITAL_COMMUNITY)
Admission: EM | Admit: 2013-04-20 | Discharge: 2013-04-21 | Disposition: A | Discharge status: Home / Self Care | Payer: 59 | Attending: Emergency Medicine | Admitting: Emergency Medicine

## 2013-04-20 ENCOUNTER — Other Ambulatory Visit: Payer: Self-pay

## 2013-04-20 ENCOUNTER — Encounter (HOSPITAL_COMMUNITY): Payer: Self-pay | Admitting: Adult Health

## 2013-04-20 DIAGNOSIS — I10 Essential (primary) hypertension: Secondary | ICD-10-CM | POA: Insufficient documentation

## 2013-04-20 DIAGNOSIS — E119 Type 2 diabetes mellitus without complications: Secondary | ICD-10-CM | POA: Insufficient documentation

## 2013-04-20 DIAGNOSIS — F1010 Alcohol abuse, uncomplicated: Secondary | ICD-10-CM | POA: Insufficient documentation

## 2013-04-20 DIAGNOSIS — F411 Generalized anxiety disorder: Secondary | ICD-10-CM | POA: Insufficient documentation

## 2013-04-20 DIAGNOSIS — Z8739 Personal history of other diseases of the musculoskeletal system and connective tissue: Secondary | ICD-10-CM | POA: Insufficient documentation

## 2013-04-20 DIAGNOSIS — F17200 Nicotine dependence, unspecified, uncomplicated: Secondary | ICD-10-CM | POA: Insufficient documentation

## 2013-04-20 DIAGNOSIS — F10929 Alcohol use, unspecified with intoxication, unspecified: Secondary | ICD-10-CM

## 2013-04-20 DIAGNOSIS — Z79899 Other long term (current) drug therapy: Secondary | ICD-10-CM | POA: Insufficient documentation

## 2013-04-20 HISTORY — DX: Depression, unspecified: F32.A

## 2013-04-20 HISTORY — DX: Major depressive disorder, single episode, unspecified: F32.9

## 2013-04-20 LAB — COMPREHENSIVE METABOLIC PANEL
ALT: 22 U/L (ref 0–35)
AST: 23 U/L (ref 0–37)
Albumin: 4.1 g/dL (ref 3.5–5.2)
Alkaline Phosphatase: 116 U/L (ref 39–117)
BUN: 16 mg/dL (ref 6–23)
CO2: 19 mEq/L (ref 19–32)
Calcium: 8.6 mg/dL (ref 8.4–10.5)
Chloride: 94 mEq/L — ABNORMAL LOW (ref 96–112)
Creatinine, Ser: 0.74 mg/dL (ref 0.50–1.10)
GFR calc Af Amer: >90 mL/min (ref >90)
GFR calc non Af Amer: 88 mL/min — ABNORMAL LOW (ref >90)
Glucose, Bld: 200 mg/dL — ABNORMAL HIGH (ref 70–99)
Potassium: 3.9 mEq/L (ref 3.5–5.1)
Sodium: 136 mEq/L (ref 135–145)
Total Bilirubin: 0.4 mg/dL (ref 0.3–1.2)
Total Protein: 7.9 g/dL (ref 6.0–8.3)

## 2013-04-20 LAB — CBC
HCT: 42.9 % (ref 36.0–46.0)
Hemoglobin: 15.1 g/dL — ABNORMAL HIGH (ref 12.0–15.0)
MCH: 31.7 pg (ref 26.0–34.0)
MCHC: 35.2 g/dL (ref 30.0–36.0)
MCV: 90.1 fL (ref 78.0–100.0)
Platelets: 248 10*3/uL (ref 150–400)
RBC: 4.76 MIL/uL (ref 3.87–5.11)
RDW: 14 % (ref 11.5–15.5)
WBC: 5.9 10*3/uL (ref 4.0–10.5)

## 2013-04-20 LAB — ACETAMINOPHEN LEVEL: Acetaminophen (Tylenol), Serum: <15 ug/mL (ref 10–30)

## 2013-04-20 LAB — ETHANOL: Alcohol, Ethyl (B): 302 mg/dL — ABNORMAL HIGH (ref 0–11)

## 2013-04-20 LAB — SALICYLATE LEVEL: Salicylate Lvl: 2.9 mg/dL (ref 2.8–20.0)

## 2013-04-20 MED ORDER — SODIUM CHLORIDE 0.9 % IV BOLUS (SEPSIS): 1000.0000 mL | Freq: Once | INTRAVENOUS | Status: DC

## 2013-04-20 MED ORDER — NEBIVOLOL HCL 5 MG PO TABS
5.0000 mg | ORAL_TABLET | Freq: Every day | ORAL | Status: DC
  Administered 2013-04-21: 5 mg via ORAL
  Filled 2013-04-20 (×2): qty 1

## 2013-04-20 MED ORDER — VITAMIN B-1 100 MG PO TABS
100.0000 mg | ORAL_TABLET | Freq: Every day | ORAL | Status: DC
  Administered 2013-04-21: 100 mg via ORAL
  Filled 2013-04-20: qty 1

## 2013-04-20 MED ORDER — FOLIC ACID 1 MG PO TABS
1.0000 mg | ORAL_TABLET | Freq: Every day | ORAL | Status: DC
  Administered 2013-04-21: 1 mg via ORAL
  Filled 2013-04-20: qty 1

## 2013-04-20 MED ORDER — EZETIMIBE 10 MG PO TABS
10.0000 mg | ORAL_TABLET | Freq: Every day | ORAL | Status: DC
  Administered 2013-04-21: 10 mg via ORAL
  Filled 2013-04-20 (×2): qty 1

## 2013-04-20 MED ORDER — LORAZEPAM 2 MG/ML IJ SOLN: 1.0000 mg | Freq: Four times a day (QID) | INTRAMUSCULAR | Status: DC | PRN

## 2013-04-20 MED ORDER — LISINOPRIL 10 MG PO TABS
5.0000 mg | ORAL_TABLET | Freq: Every day | ORAL | Status: DC
  Administered 2013-04-21: 5 mg via ORAL
  Filled 2013-04-20: qty 1

## 2013-04-20 MED ORDER — DIPHENOXYLATE-ATROPINE 2.5-0.025 MG PO TABS: 2.0000 | ORAL_TABLET | Freq: Two times a day (BID) | ORAL | Status: DC | PRN

## 2013-04-20 MED ORDER — LORAZEPAM 1 MG PO TABS
1.0000 mg | ORAL_TABLET | Freq: Four times a day (QID) | ORAL | Status: DC | PRN
  Administered 2013-04-20 – 2013-04-21 (×3): 1 mg via ORAL
  Filled 2013-04-20 (×4): qty 1

## 2013-04-20 MED ORDER — ONDANSETRON HCL 4 MG PO TABS
4.0000 mg | ORAL_TABLET | Freq: Three times a day (TID) | ORAL | Status: DC | PRN
  Administered 2013-04-21: 4 mg via ORAL
  Filled 2013-04-20 (×2): qty 1

## 2013-04-20 MED ORDER — ACETAMINOPHEN 325 MG PO TABS: 650.0000 mg | ORAL_TABLET | ORAL | Status: DC | PRN

## 2013-04-20 MED ORDER — THIAMINE HCL 100 MG/ML IJ SOLN: 100.0000 mg | Freq: Every day | INTRAMUSCULAR | Status: DC

## 2013-04-20 MED ORDER — ADULT MULTIVITAMIN W/MINERALS CH
1.0000 | ORAL_TABLET | Freq: Every day | ORAL | Status: DC
  Administered 2013-04-21: 1 via ORAL
  Filled 2013-04-20: qty 1

## 2013-04-20 NOTE — ED Notes (Signed)
Pt requesting detox from Alcohol. Last drink at 7 pm. Pt smells of ETOH. Reports going through detox 2 months ago and relapsed 2 weeks ago. HR 130s. Pt is ataxic and agitated.

## 2013-04-20 NOTE — ED Provider Notes (Signed)
History     CSN: [id number]  Arrival date & time 04/20/13  2132   First MD Initiated Contact with Patient 04/20/13 2157      Chief Complaint  Patient presents with  . Medical Clearance     HPI 67 yo F with h/o DM 2, HTN, fibromyalgia, and long history of alcohol abuse presents requesting alcohol detox. - She has abused alcohol for many years.  She was in rehab about 2 months ago and was abstinent from alcohol for a period of a few weeks, but relapsed about 2 weeks ago.  She has been drinking heavily in the interim.  She estimates that she has been drinking at least 1 large bottle of wine daily for two weeks, but usually more.  All the while, she has gradually been attempting, unsuccessfully, to cut down on her alcohol consumption.  She has experienced alcohol withdrawal and DTs before, and feels that she is beginning to have some alcohol withdrawal symptoms, namely anxiety and "feeling jittery."  Her last drink was about 4 hours ago.   - She denies chest pain, shortness of breath, nausea, vomiting, headache, abdominal pain, suicidal or homicidal ideation, or other significant symptoms.   Past Medical History  Diagnosis Date  . Alcohol abuse   . Diabetes mellitus   . Hypertension   . Fibromyalgia     Past Surgical History  Procedure Laterality Date  . Cesarean section    . Tonsillectomy    . Bunionectomy      History reviewed. No pertinent family history.  History  Substance Use Topics  . Smoking status: Current Some Day Smoker    Last Attempt to Quit: 03/11/2010  . Smokeless tobacco: Never Used  . Alcohol Use: Yes     Comment: 4-5 bottles of wine per day    OB History   Grav Para Term Preterm Abortions TAB SAB Ect Mult Living                  Review of Systems  Constitutional: Negative for fever, chills and diaphoresis.  HENT: Negative for congestion, rhinorrhea, neck pain and neck stiffness.   Respiratory: Negative for cough, shortness of breath and wheezing.    Cardiovascular: Negative for chest pain and leg swelling.  Gastrointestinal: Negative for nausea, vomiting, abdominal pain and diarrhea.  Genitourinary: Negative for dysuria, urgency, frequency, flank pain, vaginal bleeding, vaginal discharge and difficulty urinating.  Skin: Negative for rash.  Neurological: Negative for weakness, numbness and headaches.  Psychiatric/Behavioral: Negative for suicidal ideas, hallucinations and self-injury. The patient is nervous/anxious.   All other systems reviewed and are negative.    Allergies  Other; Latex; Lipitor; Pentazocine lactate; Shellfish allergy; Codeine; and Sulfonamide derivatives  Home Medications   Current Outpatient Rx  Name  Route  Sig  Dispense  Refill  . amphetamine-dextroamphetamine (ADDERALL) 30 MG tablet   Oral   Take 60 mg by mouth every morning.         . diphenoxylate-atropine (LOMOTIL) 2.5-0.025 MG per tablet   Oral   Take 2 tablets by mouth 2 (two) times daily as needed for diarrhea or loose stools.          . ezetimibe (ZETIA) 10 MG tablet   Oral   Take 10 mg by mouth daily.         Marland Kitchen lisinopril (PRINIVIL,ZESTRIL) 5 MG tablet   Oral   Take 5 mg by mouth daily.          . nebivolol (  BYSTOLIC) 5 MG tablet   Oral   Take 5 mg by mouth daily.         . Omega-3 Fatty Acids (FISH OIL) 1000 MG CPDR   Oral   Take 4 capsules by mouth at bedtime.           BP 163/90  Pulse 112  Temp(Src) 98.9 F (37.2 C) (Oral)  Resp 14  SpO2 94%  Physical Exam  Nursing note and vitals reviewed. Constitutional: She is oriented to person, place, and time. She appears well-developed and well-nourished. No distress.  Appears anxious  HENT:  Head: Normocephalic and atraumatic.  Mouth/Throat: Oropharynx is clear and moist.  Eyes: Conjunctivae and EOM are normal. Pupils are equal, round, and reactive to light. No scleral icterus.  Neck: Normal range of motion. Neck supple. No JVD present.  Cardiovascular: Normal  rate, regular rhythm, normal heart sounds and intact distal pulses.  Exam reveals no gallop and no friction rub.   No murmur heard. Pulmonary/Chest: Effort normal and breath sounds normal. No respiratory distress. She has no wheezes. She has no rales.  Abdominal: Soft. Bowel sounds are normal. She exhibits no distension. There is no tenderness. There is no rebound and no guarding.  Musculoskeletal: She exhibits no edema.  Neurological: She is alert and oriented to person, place, and time. No cranial nerve deficit. She exhibits normal muscle tone. Coordination normal.  Skin: Skin is warm and dry. She is not diaphoretic.  Psychiatric: Her mood appears anxious. Her speech is rapid and/or pressured. She is not agitated, not aggressive and not actively hallucinating. She expresses no homicidal and no suicidal ideation. She exhibits normal recent memory and normal remote memory.    ED Course  Procedures (including critical care time)  Labs Reviewed  CBC - Abnormal; Notable for the following:    Hemoglobin 15.1 (*)    All other components within normal limits  COMPREHENSIVE METABOLIC PANEL - Abnormal; Notable for the following:    Chloride 94 (*)    Glucose, Bld 200 (*)    GFR calc non Af Amer 88 (*)    All other components within normal limits  ETHANOL - Abnormal; Notable for the following:    Alcohol, Ethyl (B) 302 (*)    All other components within normal limits  ACETAMINOPHEN LEVEL  SALICYLATE LEVEL  URINE RAPID DRUG SCREEN (HOSP PERFORMED)   EKG  Date: 04/20/2013  Rate: 119  Rhythm: sinus tachycardia  QRS Axis: normal  Intervals: normal  ST/T Wave abnormalities: normal  Conduction Disutrbances:none  Narrative Interpretation:    Impression: 1.  Alcohol intoxication  MDM  67 yo F with h/o HTN and DM2, long h/o alcohol abuse presents requesting detox.  Heavy alcohol drinker.  Recently in rehab.  Relapsed 2 weeks ago.  Drinking bottles of wine every day.  Last drink 4 hours ago.   Well appearing.  NAD.  Smells of alcohol.  Cardiopulmonary exam remarkable only for tachycardia, sinus by EKG.  Non-ischemic EKG.  Electrolytes remarkable only for chloride of 94; hyperglycemia to 200; ETOH 302.    Will give IVF bolus, start alcohol withdrawal protocol.  Give vitamins and thiamine.  Move to pod C to await placement.  Care signed out while awaiting placement.        Toney Sang, MD 04/20/13 2322  Toney Sang, MD 04/20/13 2323  I performed a history and physical examination of  [name] and discussed her management with Dr. Peyton Najjar. I agree with the history,  physical, assessment, and plan of care, with the following exceptions: None I was present for the following procedures: None  Time Spent in Critical Care of the patient: None  Time spent in discussions with the patient and family: 15 minutes  Derwood Kaplan  Derwood Kaplan, MD 04/24/13 [phone]

## 2013-04-21 ENCOUNTER — Encounter (HOSPITAL_COMMUNITY): Payer: Self-pay | Admitting: Behavioral Health

## 2013-04-21 LAB — RAPID URINE DRUG SCREEN, HOSP PERFORMED
Amphetamines: NOT DETECTED
Barbiturates: NOT DETECTED
Benzodiazepines: NOT DETECTED
Cocaine: NOT DETECTED
Opiates: NOT DETECTED
Tetrahydrocannabinol: NOT DETECTED

## 2013-04-21 MED ORDER — LORAZEPAM 1 MG PO TABS
1.0000 mg | ORAL_TABLET | Freq: Once | ORAL | Status: AC
  Administered 2013-04-21: 1 mg via ORAL

## 2013-04-21 MED ORDER — ONDANSETRON HCL 4 MG PO TABS
4.0000 mg | ORAL_TABLET | Freq: Once | ORAL | Status: AC
  Administered 2013-04-21: 4 mg via ORAL

## 2013-04-21 MED ORDER — PANTOPRAZOLE SODIUM 40 MG PO TBEC: 40.0000 mg | DELAYED_RELEASE_TABLET | Freq: Every day | ORAL | Status: DC

## 2013-04-21 MED ORDER — ONDANSETRON 8 MG PO TBDP: 8.0000 mg | ORAL_TABLET | Freq: Two times a day (BID) | ORAL | Status: DC | PRN
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
## 2013-04-21 NOTE — ED Notes (Signed)
Pt continues to refused IV and IV fluids

## 2013-04-21 NOTE — ED Notes (Signed)
States that she is still shaky and if she gets Ativan 1mg  again that usually works for her.  Restless while laying in bed.  Patient put on blue scrub pants so she did not have to sleep in her shorts.

## 2013-04-21 NOTE — ED Notes (Signed)
Patient discharged with her husband to home. She was given prescriptions and she is able to verbalize instructions using the teach back method. NAD noted at the time of discharge. CIWA 4 at the time of discharge.

## 2013-04-21 NOTE — ED Notes (Signed)
Patient resting with her eyes closed.  1 side rail up.

## 2013-04-21 NOTE — ED Provider Notes (Signed)
Pt seen by [facility].  Pt has no SI, HI.  Pt is preferring to have outpt rsources and follow up for alcohol problems as outpt.  Rx for nausea and GERD provided, [facility] has provided referrals, family to take pt home.  Impression: Alcohol intoxication Alcoholism   [name]. Breken Nazari, MD 04/21/13 1450

## 2013-04-21 NOTE — Discharge Instructions (Signed)
 Alcohol  Problems Most adults who drink alcohol  drink in moderation (not a lot) are at low risk for developing problems related to their drinking. However, all drinkers, including low-risk drinkers, should know about the health risks connected with drinking alcohol . RECOMMENDATIONS FOR LOW-RISK DRINKING  Drink in moderation. Moderate drinking is defined as follows:   Men - no more than 2 drinks per day.  Nonpregnant women - no more than 1 drink per day.  Over age 61 - no more than 1 drink per day. A standard drink is 12 grams of pure alcohol , which is equal to a 12 ounce bottle of beer or wine cooler, a 5 ounce glass of wine, or 1.5 ounces of distilled spirits (such as whiskey, brandy, vodka, or rum).  ABSTAIN FROM (DO NOT DRINK) ALCOHOL :  When pregnant or considering pregnancy.  When taking a medication that interacts with alcohol .  If you are alcohol  dependent.  A medical condition that prohibits drinking alcohol  (such as ulcer, liver disease, or heart disease). DISCUSS WITH YOUR CAREGIVER:  If you are at risk for coronary heart disease, discuss the potential benefits and risks of alcohol  use: Light to moderate drinking is associated with lower rates of coronary heart disease in certain populations (for example, men over age 78 and postmenopausal women). Infrequent or nondrinkers are advised not to begin light to moderate drinking to reduce the risk of coronary heart disease so as to avoid creating an alcohol -related problem. Similar protective effects can likely be gained through proper diet and exercise.  Women and the elderly have smaller amounts of body water than men. As a result women and the elderly achieve a higher blood alcohol  concentration after drinking the same amount of alcohol .  Exposing a fetus to alcohol  can cause a broad range of birth defects referred to as Fetal Alcohol  Syndrome (FAS) or Alcohol -Related Birth Defects (ARBD). Although FAS/ARBD is connected with excessive  alcohol  consumption during pregnancy, studies also have reported neurobehavioral problems in infants born to mothers reporting drinking an average of 1 drink per day during pregnancy.  Heavier drinking (the consumption of more than 4 drinks per occasion by men and more than 3 drinks per occasion by women) impairs learning (cognitive) and psychomotor functions and increases the risk of alcohol -related problems, including accidents and injuries. CAGE QUESTIONS:   Have you ever felt that you should Cut down on your drinking?  Have people Annoyed you by criticizing your drinking?  Have you ever felt bad or Guilty about your drinking?  Have you ever had a drink first thing in the morning to steady your nerves or get rid of a hangover (Eye opener)? If you answered positively to any of these questions: You may be at risk for alcohol -related problems if alcohol  consumption is:   Men: Greater than 14 drinks per week or more than 4 drinks per occasion.  Women: Greater than 7 drinks per week or more than 3 drinks per occasion. Do you or your family have a medical history of alcohol -related problems, such as:  Blackouts.  Sexual dysfunction.  Depression.  Trauma.  Liver dysfunction.  Sleep disorders.  Hypertension.  Chronic abdominal pain.  Has your drinking ever caused you problems, such as problems with your family, problems with your work (or school) performance, or accidents/injuries?  Do you have a compulsion to drink or a preoccupation with drinking?  Do you have poor control or are you unable to stop drinking once you have started?  Do you have to drink to  avoid withdrawal symptoms?  Do you have problems with withdrawal such as tremors, nausea, sweats, or mood disturbances?  Does it take more alcohol  than in the past to get you high?  Do you feel a strong urge to drink?  Do you change your plans so that you can have a drink?  Do you ever drink in the morning to relieve  the shakes or a hangover? If you have answered a number of the previous questions positively, it may be time for you to talk to your caregivers, family, and friends and see if they think you have a problem. Alcoholism is a chemical dependency that keeps getting worse and will eventually destroy your health and relationships. Many alcoholics end up dead, impoverished, or in prison. This is often the end result of all chemical dependency.  Do not be discouraged if you are not ready to take action immediately.  Decisions to change behavior often involve up and down desires to change and feeling like you cannot decide.  Try to think more seriously about your drinking behavior.  Think of the reasons to quit. WHERE TO GO FOR ADDITIONAL INFORMATION   The National Institute on Alcohol  Abuse and Alcoholism (NIAAA)www.niaaa.nih.gov  [employer] on Alcoholism and Drug Dependence (NCADD)www.ncadd.org  American Society of Addiction Medicine (ASAM)www.https://[email or web address]/ Document Released: 11/29/2005 Document Revised: 02/21/2012 Document Reviewed: 07/17/2008 [facility] Patient Information 2013 [place], [place].     RESOURCE GUIDE  Chronic Pain Problems: Contact [facility]  [phone] Patients need to be referred by their primary care doctor.  Insufficient Money for Medicine: Contact United Way:  call 211.   No Primary Care Doctor: - Call Health Connect  [phone] - can help you locate a primary care doctor that  accepts your insurance, provides certain services, etc. - Physician Referral Service- [phone]  Agencies that provide inexpensive medical care: - [facility]  [phone] - [facility]  [phone] - [facility]  [phone] - Women's Clinic  [phone] - Planned Parenthood  [phone] [facility]  [phone]  Medicaid-accepting [facility] Providers: - [facility]- [address], [address]  [phone], Mon-Fri 9am-7pm, Sat 9am-1pm - [facility]- [address] [place], [address] [place]  [phone] - [facility]- [address] [place]  [phone] [facility]- [address]  [phone] - Kennieth Leech- [address] [place], [address], [phone]  Only accepts [place] patients after they have their name  applied to their card  Self Pay (no insurance) in [place]: - Sickle Cell Patients: Dr Camellia Hutchinson, [facility] Internal Medicine  [address] [place], [phone] - [facility] Urgent Care- [address] [place]  [phone]       [facility] [place]- [address]       -     [facility]- see information above (Speak to [employer] if you do not have insurance)       -  [facility]- [address],  [phone]       -  [facility]- [address] [phone]       -  Dr Catalina-  [address], [place], [phone]       -  Urgent Medical and [facility] - [address], [phone]       -  [facility]- [address] [phone], also [address], [phone]       -    [facility]- [address] [place], [phone], 1st & 3rd Saturday        every month, 10am-1pm  [facility] [address] [place], [place] [address] [phone]  [facility] [address] [place], [place] [address] [phone]  1) Find a Doctor and Pay Out of Pocket Although you won't have to find out who is covered by your insurance plan, it is a good idea to ask around and get recommendations. You will then need to call the office and see if the doctor you have chosen will accept you as a new patient and what types of options they offer for patients who are self-pay. Some doctors offer discounts or will set up payment plans for their patients who do not have insurance, but you will  need to ask so you aren't surprised when you get to your appointment.  2) Contact Your Local Health Department Not all health departments have doctors that can see patients for sick visits, but many do, so it is worth a call to see if yours does. If you don't know where your local health department is, you can check in your phone book. The CDC also has a tool to help you locate your state's health department, and many state websites also have listings of all of their local health departments.  3) Find a Walk-in Clinic If your illness is not likely to be very severe or complicated, you may want to try a walk in clinic. These are popping up all over the country in pharmacies, drugstores, and shopping centers. They're usually staffed by nurse practitioners or physician assistants that have been trained to treat common illnesses and complaints. They're usually fairly quick and inexpensive. However, if you have serious medical issues or chronic medical problems, these are probably not your best option  STD Testing - [facility] of [facility], STD Clinic, [address] [place], phone [phone] or [phone].  Monday - Friday, call for an appointment. [facility] of [employer], STD Clinic, [place] [address], [place], phone [phone] or [phone].  Monday - Friday, call for an appointment.  Abuse/Neglect: [facility] Child Abuse Hotline (604)609-2951 [facility] Child Abuse Hotline 843-541-4892 (After Hours)  Emergency Shelter:  [facility] [phone]  Maternity Homes: - [facility] [phone] - [facility] [phone]  MRSA Hotline #:   731-650-7583  Dental Assistance If unable to pay or uninsured, contact:  [facility]. to become qualified for the adult dental clinic.  Patients with Medicaid: [facility] [phone] [address], [phone] [address] [phone]  If unable to pay, or uninsured, contact [facility] [phone] in [place], [phone] in [facility]) to become qualified for the adult dental clinic  [facility] [address] [place], [place] [address] [phone] [email or web address]  Other Proofreader Services: - [facility]- [address] [place], [place], [place], [address], [phone], Ext. [phone], 2nd and 4th Thursday of the month at 6:30am.  10 clients each day by appointment, can sometimes see walk-in patients if someone does not show for an appointment. [facility]- [address] [place], [place], [address],  [phone] [facility] [address] [place], [place], [address], [phone] - [facility]- [phone] [facility] Health Department- [phone] [facility] Health [phone]       Behavioral Health Resources in the [facility]  Intensive Outpatient Programs: [facility]      [address] [place], [place] [phone] Both a day and evening program       [facility] Outpatient     [address]        [place], [place] [address] [phone]         ADS: Alcohol  & Drug S[facility]cs [address] [place] [phone]  [facility] Mental Health ACCESS LINE: [phone] or [phone] [address] [place], [place] [address] [email or web address]   Substance Abuse Resources: - Alcohol  and Drug Services  [phone] - [facility] [phone] - [facility] [place] [phone] [facility] [phone] - Residential & Outpatient Substance Abuse Program  [phone]  Psychological Services: [facility]  [phone] Services  [phone] - [facility], [phone] [place]. [address] [place], ACCESS LINE:  ([phone] or [phone], [email or web address]  Mobile Crisis Teams:                                        Therapeutic Alternatives         Mobile Crisis Care Unit [phone]             [facility] [address] [place] [phone]                                         Interventionist [address] [place] [address] [place] [phone]  Self-Help/Support Groups: Mental Health Assoc. of [employer] of support groups [phone] (call for more info)   Narcotics Anonymous (NA) Caring Services [address] [place] - 2 meetings at this location  Residential Treatment Programs:  [facility]      [address]        [place]       [phone]         [facility] [address] [place] 892881 [place], [place]  [address] [phone]  [facility]  [address] [place], [place] [address] [phone] Admissions: 8am-3pm M-F  [facility]     [address]        [place], [place] [address]       [phone]         [facility] [address] Christianna COYER [place], [place] [phone]  [facility] [address] [place], [place] [phone]  [facility] - Intensive Outpatient      [address]     [place], [place]       [phone]         [facility] (Addiction Recovery Care [facility]oc.)     [address] [place], [place] [phone] or [phone]  Residential Treatment Services (RTS), Medicaid [address] [place], [place] [phone]  [facility]  [address] [place] [phone]  [facility] Resources: [phone]               General Therapy                                                Mliss Rival, PhD        [address]  [place], [place] [address]         [phone]   Insurance  [facility]   [address] [place], [place] [address] [phone]  [facility] [address] [place], [place] [address] [phone] Insurance/Medicaid/sponsorship through [facility]                                              [address]                                        [place], [place] [address]    Therapy/tele-psych/case         [phone]          [facility] [address] [place], [place]  [address]  Adolescent/group home/case management [phone]                                           Recardo Rival PhD       General therapy       Insurance   [phone]         Dr. Curry, Insurance, M-F [phone]  [facility]  United [facility] Dept. [address]                 [address]         [address] [place] [address]  Tinnie Keenan Keenan Phone:  [phone]                                  Phone:  [phone]                   Phone:  [phone]  [facility], [phone] - [facility] - [phone]       -     [facility]ene  [facility] in [place], [address]             [phone], Insurance  [place] Child Abuse Hotline 806-301-9505 or [phone] (After Hours)

## 2013-04-21 NOTE — ED Notes (Signed)
Patient refuses IV access

## 2013-04-21 NOTE — ED Notes (Signed)
Report to Eric

## 2013-05-25 ENCOUNTER — Encounter (HOSPITAL_COMMUNITY): Payer: Self-pay | Admitting: Emergency Medicine

## 2013-05-25 ENCOUNTER — Emergency Department (HOSPITAL_COMMUNITY)
Admission: EM | Admit: 2013-05-25 | Discharge: 2013-05-26 | Disposition: A | Discharge status: Other Acute Inpatient Hospital | Payer: 59 | Attending: Emergency Medicine | Admitting: Emergency Medicine

## 2013-05-25 DIAGNOSIS — F17200 Nicotine dependence, unspecified, uncomplicated: Secondary | ICD-10-CM | POA: Insufficient documentation

## 2013-05-25 DIAGNOSIS — F3289 Other specified depressive episodes: Secondary | ICD-10-CM | POA: Insufficient documentation

## 2013-05-25 DIAGNOSIS — R110 Nausea: Secondary | ICD-10-CM | POA: Insufficient documentation

## 2013-05-25 DIAGNOSIS — F329 Major depressive disorder, single episode, unspecified: Secondary | ICD-10-CM | POA: Insufficient documentation

## 2013-05-25 DIAGNOSIS — F1010 Alcohol abuse, uncomplicated: Secondary | ICD-10-CM | POA: Insufficient documentation

## 2013-05-25 DIAGNOSIS — E119 Type 2 diabetes mellitus without complications: Secondary | ICD-10-CM | POA: Insufficient documentation

## 2013-05-25 DIAGNOSIS — Z79899 Other long term (current) drug therapy: Secondary | ICD-10-CM | POA: Insufficient documentation

## 2013-05-25 DIAGNOSIS — I6529 Occlusion and stenosis of unspecified carotid artery: Secondary | ICD-10-CM | POA: Insufficient documentation

## 2013-05-25 DIAGNOSIS — I10 Essential (primary) hypertension: Secondary | ICD-10-CM | POA: Insufficient documentation

## 2013-05-25 HISTORY — DX: Occlusion and stenosis of unspecified carotid artery: I65.29

## 2013-05-25 LAB — CBC WITH DIFFERENTIAL/PLATELET
Basophils Absolute: 0 10*3/uL (ref 0.0–0.1)
Basophils Relative: 1 % (ref 0–1)
Eosinophils Absolute: 0 10*3/uL (ref 0.0–0.7)
Eosinophils Relative: 1 % (ref 0–5)
HCT: 37.8 % (ref 36.0–46.0)
Hemoglobin: 13.1 g/dL (ref 12.0–15.0)
Lymphocytes Relative: 43 % (ref 12–46)
Lymphs Abs: 2.7 10*3/uL (ref 0.7–4.0)
MCH: 32 pg (ref 26.0–34.0)
MCHC: 34.7 g/dL (ref 30.0–36.0)
MCV: 92.2 fL (ref 78.0–100.0)
Monocytes Absolute: 0.4 10*3/uL (ref 0.1–1.0)
Monocytes Relative: 6 % (ref 3–12)
Neutro Abs: 3.1 10*3/uL (ref 1.7–7.7)
Neutrophils Relative %: 50 % (ref 43–77)
Platelets: 215 10*3/uL (ref 150–400)
RBC: 4.1 MIL/uL (ref 3.87–5.11)
RDW: 14.1 % (ref 11.5–15.5)
WBC: 6.3 10*3/uL (ref 4.0–10.5)

## 2013-05-25 LAB — RAPID URINE DRUG SCREEN, HOSP PERFORMED
Amphetamines: NOT DETECTED
Barbiturates: NOT DETECTED
Benzodiazepines: NOT DETECTED
Cocaine: NOT DETECTED
Opiates: NOT DETECTED
Tetrahydrocannabinol: NOT DETECTED

## 2013-05-25 MED ORDER — VITAMIN B-1 100 MG PO TABS
100.0000 mg | ORAL_TABLET | Freq: Every day | ORAL | Status: DC
  Administered 2013-05-25 – 2013-05-26 (×2): 100 mg via ORAL
  Filled 2013-05-25 (×2): qty 1

## 2013-05-25 MED ORDER — ADULT MULTIVITAMIN W/MINERALS CH
1.0000 | ORAL_TABLET | Freq: Every day | ORAL | Status: DC
  Administered 2013-05-25 – 2013-05-26 (×2): 1 via ORAL
  Filled 2013-05-25 (×2): qty 1

## 2013-05-25 MED ORDER — THIAMINE HCL 100 MG/ML IJ SOLN: 100.0000 mg | Freq: Every day | INTRAMUSCULAR | Status: DC

## 2013-05-25 MED ORDER — LORAZEPAM 1 MG PO TABS
0.0000 mg | ORAL_TABLET | Freq: Four times a day (QID) | ORAL | Status: DC
Start: 2013-05-25 — End: 2013-05-26
  Administered 2013-05-25 – 2013-05-26 (×3): 1 mg via ORAL
  Filled 2013-05-25 (×2): qty 1

## 2013-05-25 MED ORDER — FOLIC ACID 1 MG PO TABS
1.0000 mg | ORAL_TABLET | Freq: Every day | ORAL | Status: DC
  Administered 2013-05-25 – 2013-05-26 (×2): 1 mg via ORAL
  Filled 2013-05-25 (×2): qty 1

## 2013-05-25 MED ORDER — ACETAMINOPHEN 325 MG PO TABS: 650.0000 mg | ORAL_TABLET | ORAL | Status: DC | PRN

## 2013-05-25 MED ORDER — LISINOPRIL 10 MG PO TABS
5.0000 mg | ORAL_TABLET | Freq: Every day | ORAL | Status: DC
  Administered 2013-05-26: 5 mg via ORAL
  Filled 2013-05-25: qty 1

## 2013-05-25 MED ORDER — LORAZEPAM 1 MG PO TABS: 0.0000 mg | ORAL_TABLET | Freq: Two times a day (BID) | ORAL | Status: DC

## 2013-05-25 MED ORDER — LORAZEPAM 2 MG/ML IJ SOLN: 1.0000 mg | Freq: Four times a day (QID) | INTRAMUSCULAR | Status: DC | PRN

## 2013-05-25 MED ORDER — EZETIMIBE 10 MG PO TABS
10.0000 mg | ORAL_TABLET | Freq: Every day | ORAL | Status: DC
  Administered 2013-05-26: 10 mg via ORAL
  Filled 2013-05-25: qty 1

## 2013-05-25 MED ORDER — ONDANSETRON HCL 4 MG PO TABS
4.0000 mg | ORAL_TABLET | Freq: Three times a day (TID) | ORAL | Status: DC | PRN
  Administered 2013-05-26: 4 mg via ORAL
  Filled 2013-05-25: qty 1

## 2013-05-25 MED ORDER — ESCITALOPRAM OXALATE 10 MG PO TABS
20.0000 mg | ORAL_TABLET | Freq: Every day | ORAL | Status: DC
  Administered 2013-05-26: 20 mg via ORAL
  Filled 2013-05-25: qty 2

## 2013-05-25 MED ORDER — LORAZEPAM 1 MG PO TABS
1.0000 mg | ORAL_TABLET | Freq: Four times a day (QID) | ORAL | Status: DC | PRN
  Administered 2013-05-26: 1 mg via ORAL
  Filled 2013-05-25 (×2): qty 1

## 2013-05-25 MED ORDER — IBUPROFEN 400 MG PO TABS: 600.0000 mg | ORAL_TABLET | Freq: Three times a day (TID) | ORAL | Status: DC | PRN

## 2013-05-25 MED ORDER — ALUM & MAG HYDROXIDE-SIMETH 200-200-20 MG/5ML PO SUSP: 30.0000 mL | ORAL | Status: DC | PRN

## 2013-05-25 NOTE — ED Notes (Signed)
Pt knows she needs a urine sample.

## 2013-05-25 NOTE — ED Notes (Signed)
PT. REQUESTING DETOX FOR ALCOHOL ABUSE , LAST DRINK THIS EVENING , DENIES SUICIDAL IDEATION .

## 2013-05-25 NOTE — ED Notes (Signed)
Pt given blue paper scrubs and sox to put on.

## 2013-05-25 NOTE — ED Provider Notes (Signed)
History     CSN: [id number]  Arrival date & time 05/25/13  1918   First MD Initiated Contact with Patient 05/25/13 2004      Chief Complaint  Patient presents with  . Alcohol Problem    (Consider location/radiation/quality/duration/timing/severity/associated sxs/prior treatment) HPI Comments: Patient presents today requesting detox from alcohol.  She reports that she drinks eight small bottles of wine daily.  Last drink was approximately one hour prior to arrival in the ED.  She reports that she has gone through detox in the past.  No prior history of DT's.  She reports that she is feeling "jittery" and nauseous at this time.  Denies hallucinations.  Denies SI or HI.  Patient is a 67 y.o. female presenting with alcohol problem. The history is provided by the patient.  Alcohol Problem Associated symptoms include nausea. Pertinent negatives include no chest pain or vomiting.    Past Medical History  Diagnosis Date  . Alcohol abuse   . Diabetes mellitus   . Hypertension   . Fibromyalgia   . Depression   . Carotid artery stenosis     Past Surgical History  Procedure Laterality Date  . Cesarean section    . Tonsillectomy    . Bunionectomy      No family history on file.  History  Substance Use Topics  . Smoking status: Current Some Day Smoker    Last Attempt to Quit: 03/11/2010  . Smokeless tobacco: Never Used  . Alcohol Use: Yes     Comment: 4-5 bottles of wine per day    OB History   Grav Para Term Preterm Abortions TAB SAB Ect Mult Living                  Review of Systems  Cardiovascular: Negative for chest pain.  Gastrointestinal: Positive for nausea. Negative for vomiting.  Psychiatric/Behavioral: Negative for suicidal ideas, hallucinations, confusion, sleep disturbance and self-injury.  All other systems reviewed and are negative.    Allergies  Other; Latex; Lipitor; Shellfish allergy; Codeine; Pentazocine lactate; and Sulfonamide derivatives  Home  Medications   Current Outpatient Rx  Name  Route  Sig  Dispense  Refill  . amphetamine-dextroamphetamine (ADDERALL) 30 MG tablet   Oral   Take 60 mg by mouth every morning.         . diphenoxylate-atropine (LOMOTIL) 2.5-0.025 MG per tablet   Oral   Take 2 tablets by mouth 2 (two) times daily as needed for diarrhea or loose stools.          Marland Kitchen escitalopram (LEXAPRO) 20 MG tablet   Oral   Take 20 mg by mouth daily.         Marland Kitchen ezetimibe (ZETIA) 10 MG tablet   Oral   Take 10 mg by mouth daily.         Marland Kitchen lisinopril (PRINIVIL,ZESTRIL) 5 MG tablet   Oral   Take 5 mg by mouth daily.          . nebivolol (BYSTOLIC) 5 MG tablet   Oral   Take 5 mg by mouth daily.         . Omega-3 Fatty Acids (FISH OIL) 1000 MG CPDR   Oral   Take 4 capsules by mouth at bedtime.         . ondansetron (ZOFRAN-ODT) 8 MG disintegrating tablet   Oral   Take 1 tablet (8 mg total) by mouth every 12 (twelve) hours as needed for nausea.   20 tablet  0   . pantoprazole (PROTONIX) 40 MG tablet   Oral   Take 1 tablet (40 mg total) by mouth daily.   14 tablet   0     BP 110/60  Pulse 79  Temp(Src) 99 F (37.2 C) (Oral)  Resp 14  SpO2 96%  Physical Exam  Nursing note and vitals reviewed. Constitutional: She appears well-developed and well-nourished. No distress.  HENT:  Head: Normocephalic and atraumatic.  Eyes: EOM are normal. Pupils are equal, round, and reactive to light.  Neck: Normal range of motion. Neck supple.  Cardiovascular: Normal rate, regular rhythm and normal heart sounds.   Pulmonary/Chest: Effort normal and breath sounds normal.  Abdominal: Soft. There is no tenderness.  Neurological: She is alert.  Skin: Skin is warm and dry. She is not diaphoretic.  Psychiatric: She has a normal mood and affect. Her behavior is normal. She expresses no homicidal and no suicidal ideation. She expresses no suicidal plans and no homicidal plans.    ED Course  Procedures  (including critical care time)  Labs Reviewed  URINE RAPID DRUG SCREEN (HOSP PERFORMED)  ETHANOL  CBC WITH DIFFERENTIAL  COMPREHENSIVE METABOLIC PANEL   No results found.   No diagnosis found.  Patient discussed with Dr. Bernette Mayers.  MDM  Patient presents requesting detox from alcohol.  ACT team has been notified.  CIWA orders and psych holding orders have been placed.        Pascal Lux Ovando, PA-C 05/25/13 2143

## 2013-05-25 NOTE — ED Notes (Signed)
Pt reports history of alcoholism. States she has been sober for 5-6 years but went on a 4 day binge due to husband leaving her. States that she has been drinking 3-4 packages of small bottles of wine a day. Pt reports last drink was just prior to coming to the ER. Pt here voluntarily for detox.

## 2013-05-25 NOTE — BH Assessment (Addendum)
Assessment Note     Patient is a 67 year old white female. Patient requests detox from alcohol.  . Patient reports that she relapsed 2 weeks ago due and went on a 4 day binge drinking due to her husband leaving her.  Patient reports that she drinks eight small bottles of wine daily.  Patient has a CIWA score of 11.  Patient reports that her last drink was approximately one hour prior to arrival in the ED.  Patient reports the following withdrawal symptoms chills, sweats, slight nausea, feeling jittery and agitation. Patient reports a past history of detox and substance abuse treatment over the past 10years. Patient was unsure of the dates of detox and treatment.  Patient reports that her longest period of sobriety was 4 1/2 year ago.  Patient reports that she has been drinking for over 30 years.  Patient reports a prior history of outpatient therapy and medication management.  Patient reports that she is compliant with taking her medication. Patient reports that she has a previous diagnosis of Major Depressive Disorder and PTSD.  Patient denies psychosis.  Patient denies SI or HI.   Axis I: Alcohol Dependence, PTSD,  Major Depressive Disorder Axis II: Deferred Axis III:  Past Medical History  Diagnosis Date  . Alcohol abuse   . Diabetes mellitus   . Hypertension   . Fibromyalgia   . Depression   . Carotid artery stenosis    Axis IV: economic problems, other psychosocial or environmental problems, problems related to social environment, problems with access to health care services and problems with primary support group Axis V: 31-40 impairment in reality testing  Past Medical History:  Past Medical History  Diagnosis Date  . Alcohol abuse   . Diabetes mellitus   . Hypertension   . Fibromyalgia   . Depression   . Carotid artery stenosis     Past Surgical History  Procedure Laterality Date  . Cesarean section    . Tonsillectomy    . Bunionectomy      Family History: No family  history on file.  Social History:  reports that she has been smoking.  She has never used smokeless tobacco. She reports that  drinks alcohol. She reports that she does not use illicit drugs.  Additional Social History:     CIWA: CIWA-Ar BP: 110/60 mmHg Pulse Rate: 79 Nausea and Vomiting: no nausea and no vomiting Tactile Disturbances: none Tremor: no tremor Auditory Disturbances: not present Paroxysmal Sweats: no sweat visible Visual Disturbances: not present Anxiety: moderately anxious, or guarded, so anxiety is inferred Headache, Fullness in Head: none present Agitation: normal activity Orientation and Clouding of Sensorium: cannot do serial additions or is uncertain about date CIWA-Ar Total: 5 COWS:    Allergies:  Allergies  Allergen Reactions  . Other Anaphylaxis    shellfish  . Latex Other (See Comments)    Break outs  . Lipitor (Atorvastatin Calcium) Other (See Comments)    Muscle aches  . Shellfish Allergy   . Codeine Rash    And nausea   . Pentazocine Lactate Rash  . Sulfonamide Derivatives Rash    Home Medications:  (Not in a hospital admission)  OB/GYN Status:  No LMP recorded. Patient is postmenopausal.  General Assessment Data Location of Assessment: [facility] ED ACT Assessment: Yes Living Arrangements: Spouse/significant other Can pt return to current living arrangement?: Yes Admission Status: Voluntary Is patient capable of signing voluntary admission?: Yes Transfer from: Acute Hospital Referral Source: Self/Family/Friend  Risk to self Suicidal Ideation: No Suicidal Intent: No Is patient at risk for suicide?: No Suicidal Plan?: No Access to Means: No What has been your use of drugs/alcohol within the last 12 months?: alcohol  Previous Attempts/Gestures: No How many times?: 0 Other Self Harm Risks: None  Triggers for Past Attempts: None known Intentional Self Injurious Behavior: None Family Suicide History: Yes Recent stressful life  event(s): Divorce;Loss (Comment);Financial Problems;Trauma (Comment) Persecutory voices/beliefs?: No Depression: Yes Depression Symptoms: Despondent;Isolating;Guilt;Feeling worthless/self pity Substance abuse history and/or treatment for substance abuse?: Yes Suicide prevention information given to non-admitted patients: Not applicable  Risk to Others Homicidal Ideation: No Thoughts of Harm to Others: No Current Homicidal Intent: No Current Homicidal Plan: No Access to Homicidal Means: No Identified Victim: N/A History of harm to others?: No Assessment of Violence: None Noted Violent Behavior Description: calm Does patient have access to weapons?: No Criminal Charges Pending?: No Does patient have a court date: No  Psychosis Hallucinations: None noted Delusions: None noted  Mental Status Report Appear/Hygiene: Disheveled Eye Contact: Fair Motor Activity: Freedom of movement Speech: Logical/coherent Level of Consciousness: Alert Mood: Depressed Affect: Depressed Anxiety Level: Minimal Thought Processes: Coherent Judgement: Unimpaired Orientation: Person;Place;Time;Situation  Cognitive Functioning Concentration: Decreased Memory: Recent Intact;Remote Intact IQ: Average Insight: Poor Impulse Control: Fair Appetite: Fair Weight Loss: 0 Weight Gain: 0 Sleep: No Change Total Hours of Sleep: 8 Vegetative Symptoms: None  ADLScreening [facility]) Patient's cognitive ability adequate to safely complete daily activities?: Yes Patient able to express need for assistance with ADLs?: Yes Independently performs ADLs?: Yes (appropriate for developmental age)  Abuse/Neglect [facility]) Physical Abuse: Denies Verbal Abuse: Denies Sexual Abuse: Denies  Prior Inpatient Therapy Prior Inpatient Therapy: Yes Prior Therapy Dates: unable to remember the dates Prior Therapy Facilty/Provider(s): [facility] and [facility]  Reason for Treatment: Detox  Prior Outpatient  Therapy Prior Outpatient Therapy: Yes Prior Therapy Dates: ongoing current  Prior Therapy Facilty/Provider(s): private practice  Reason for Treatment: substance abuse and depression   ADL Screening (condition at time of admission) Patient's cognitive ability adequate to safely complete daily activities?: Yes Patient able to express need for assistance with ADLs?: Yes Independently performs ADLs?: Yes (appropriate for developmental age)       Abuse/Neglect Assessment (Assessment to be complete while patient is alone) Physical Abuse: Denies Verbal Abuse: Denies Sexual Abuse: Denies          Additional Information 1:1 In Past 12 Months?: No CIRT Risk: No Elopement Risk: No Does patient have medical clearance?: Yes     Disposition: Pending [facility].  Disposition Initial Assessment Completed for this Encounter: Yes Disposition of Patient: Other dispositions Type of outpatient treatment: Adult  On Site Evaluation by:   Reviewed with Physician:     Phillip Heal LaVerne 05/25/2013 11:58 PM

## 2013-05-26 ENCOUNTER — Inpatient Hospital Stay (HOSPITAL_COMMUNITY)
Admission: AD | Admit: 2013-05-26 | Discharge: 2013-05-29 | DRG: 897 | Disposition: A | Discharge status: Home / Self Care | Payer: 59 | Source: Intra-hospital | Attending: Psychiatry | Admitting: Psychiatry

## 2013-05-26 ENCOUNTER — Encounter (HOSPITAL_COMMUNITY): Payer: Self-pay | Admitting: *Deleted

## 2013-05-26 DIAGNOSIS — IMO0001 Reserved for inherently not codable concepts without codable children: Secondary | ICD-10-CM | POA: Diagnosis present

## 2013-05-26 DIAGNOSIS — F329 Major depressive disorder, single episode, unspecified: Secondary | ICD-10-CM

## 2013-05-26 DIAGNOSIS — E119 Type 2 diabetes mellitus without complications: Secondary | ICD-10-CM | POA: Diagnosis present

## 2013-05-26 DIAGNOSIS — F1020 Alcohol dependence, uncomplicated: Principal | ICD-10-CM

## 2013-05-26 DIAGNOSIS — F332 Major depressive disorder, recurrent severe without psychotic features: Secondary | ICD-10-CM

## 2013-05-26 DIAGNOSIS — I10 Essential (primary) hypertension: Secondary | ICD-10-CM | POA: Diagnosis present

## 2013-05-26 DIAGNOSIS — Z79899 Other long term (current) drug therapy: Secondary | ICD-10-CM

## 2013-05-26 DIAGNOSIS — F3289 Other specified depressive episodes: Secondary | ICD-10-CM

## 2013-05-26 DIAGNOSIS — F1010 Alcohol abuse, uncomplicated: Secondary | ICD-10-CM

## 2013-05-26 LAB — COMPREHENSIVE METABOLIC PANEL
ALT: 14 U/L (ref 0–35)
AST: 16 U/L (ref 0–37)
Albumin: 3.6 g/dL (ref 3.5–5.2)
Alkaline Phosphatase: 94 U/L (ref 39–117)
BUN: 14 mg/dL (ref 6–23)
CO2: 25 mEq/L (ref 19–32)
Calcium: 8.8 mg/dL (ref 8.4–10.5)
Chloride: 96 mEq/L (ref 96–112)
Creatinine, Ser: 0.74 mg/dL (ref 0.50–1.10)
GFR calc Af Amer: >90 mL/min (ref >90)
GFR calc non Af Amer: 88 mL/min — ABNORMAL LOW (ref >90)
Glucose, Bld: 190 mg/dL — ABNORMAL HIGH (ref 70–99)
Potassium: 4.7 mEq/L (ref 3.5–5.1)
Sodium: 136 mEq/L (ref 135–145)
Total Bilirubin: 0.2 mg/dL — ABNORMAL LOW (ref 0.3–1.2)
Total Protein: 6.9 g/dL (ref 6.0–8.3)

## 2013-05-26 LAB — ETHANOL: Alcohol, Ethyl (B): 181 mg/dL — ABNORMAL HIGH (ref 0–11)

## 2013-05-26 MED ORDER — EZETIMIBE 10 MG PO TABS
10.0000 mg | ORAL_TABLET | Freq: Every day | ORAL | Status: DC
  Administered 2013-05-27 – 2013-05-29 (×3): 10 mg via ORAL
  Filled 2013-05-26 (×5): qty 1

## 2013-05-26 MED ORDER — CHLORDIAZEPOXIDE HCL 25 MG PO CAPS
25.0000 mg | ORAL_CAPSULE | Freq: Once | ORAL | Status: AC
  Administered 2013-05-26: 25 mg via ORAL
  Filled 2013-05-26 (×2): qty 1

## 2013-05-26 MED ORDER — ESCITALOPRAM OXALATE 20 MG PO TABS
20.0000 mg | ORAL_TABLET | Freq: Every day | ORAL | Status: DC
  Administered 2013-05-27 – 2013-05-29 (×3): 20 mg via ORAL
  Filled 2013-05-26: qty 1
  Filled 2013-05-26 (×2): qty 4
  Filled 2013-05-26 (×3): qty 1

## 2013-05-26 MED ORDER — ONDANSETRON 4 MG PO TBDP
8.0000 mg | ORAL_TABLET | Freq: Three times a day (TID) | ORAL | Status: DC | PRN
Start: 2013-05-26 — End: 2013-05-29

## 2013-05-26 MED ORDER — CHLORDIAZEPOXIDE HCL 25 MG PO CAPS
25.0000 mg | ORAL_CAPSULE | Freq: Four times a day (QID) | ORAL | Status: AC
  Administered 2013-05-27 (×4): 25 mg via ORAL
  Filled 2013-05-26 (×6): qty 1

## 2013-05-26 MED ORDER — HYDROXYZINE HCL 25 MG PO TABS: 25.0000 mg | ORAL_TABLET | Freq: Four times a day (QID) | ORAL | Status: DC | PRN

## 2013-05-26 MED ORDER — ADULT MULTIVITAMIN W/MINERALS CH
1.0000 | ORAL_TABLET | Freq: Every day | ORAL | Status: DC
  Administered 2013-05-27 – 2013-05-29 (×3): 1 via ORAL
  Filled 2013-05-26 (×5): qty 1

## 2013-05-26 MED ORDER — VITAMIN B-1 100 MG PO TABS
100.0000 mg | ORAL_TABLET | Freq: Every day | ORAL | Status: DC
  Administered 2013-05-27 – 2013-05-29 (×3): 100 mg via ORAL
  Filled 2013-05-26 (×5): qty 1

## 2013-05-26 MED ORDER — MAGNESIUM HYDROXIDE 400 MG/5ML PO SUSP: 30.0000 mL | Freq: Every day | ORAL | Status: DC | PRN

## 2013-05-26 MED ORDER — CHLORDIAZEPOXIDE HCL 25 MG PO CAPS
25.0000 mg | ORAL_CAPSULE | Freq: Four times a day (QID) | ORAL | Status: DC | PRN
  Administered 2013-05-28 (×2): 25 mg via ORAL
  Filled 2013-05-26: qty 1

## 2013-05-26 MED ORDER — LISINOPRIL 5 MG PO TABS
5.0000 mg | ORAL_TABLET | Freq: Every day | ORAL | Status: DC
  Administered 2013-05-27 – 2013-05-29 (×3): 5 mg via ORAL
  Filled 2013-05-26 (×5): qty 1

## 2013-05-26 MED ORDER — HYDROXYZINE HCL 25 MG PO TABS
25.0000 mg | ORAL_TABLET | Freq: Every evening | ORAL | Status: DC | PRN
  Filled 2013-05-26: qty 4

## 2013-05-26 MED ORDER — DIPHENOXYLATE-ATROPINE 2.5-0.025 MG PO TABS
2.0000 | ORAL_TABLET | Freq: Two times a day (BID) | ORAL | Status: DC
  Administered 2013-05-26 – 2013-05-27 (×3): 2 via ORAL
  Filled 2013-05-26 (×4): qty 2
  Filled 2013-05-26: qty 1

## 2013-05-26 MED ORDER — ALUM & MAG HYDROXIDE-SIMETH 200-200-20 MG/5ML PO SUSP: 30.0000 mL | ORAL | Status: DC | PRN

## 2013-05-26 MED ORDER — ACETAMINOPHEN 325 MG PO TABS
650.0000 mg | ORAL_TABLET | Freq: Four times a day (QID) | ORAL | Status: DC | PRN
Start: 2013-05-26 — End: 2013-05-29

## 2013-05-26 MED ORDER — NEBIVOLOL HCL 5 MG PO TABS
5.0000 mg | ORAL_TABLET | Freq: Every day | ORAL | Status: DC
  Administered 2013-05-27 – 2013-05-29 (×3): 5 mg via ORAL
  Filled 2013-05-26 (×5): qty 1

## 2013-05-26 MED ORDER — CHLORDIAZEPOXIDE HCL 25 MG PO CAPS: 25.0000 mg | ORAL_CAPSULE | Freq: Every day | ORAL | Status: DC

## 2013-05-26 MED ORDER — CHLORDIAZEPOXIDE HCL 25 MG PO CAPS: 25.0000 mg | ORAL_CAPSULE | ORAL | Status: DC

## 2013-05-26 MED ORDER — ONDANSETRON 4 MG PO TBDP
4.0000 mg | ORAL_TABLET | Freq: Four times a day (QID) | ORAL | Status: DC | PRN
  Administered 2013-05-26: 4 mg via ORAL

## 2013-05-26 MED ORDER — PANTOPRAZOLE SODIUM 40 MG PO TBEC
40.0000 mg | DELAYED_RELEASE_TABLET | Freq: Every day | ORAL | Status: DC
  Administered 2013-05-27 – 2013-05-29 (×3): 40 mg via ORAL
  Filled 2013-05-26 (×5): qty 1

## 2013-05-26 MED ORDER — CHLORDIAZEPOXIDE HCL 25 MG PO CAPS
25.0000 mg | ORAL_CAPSULE | Freq: Three times a day (TID) | ORAL | Status: AC
  Administered 2013-05-28 – 2013-05-29 (×3): 25 mg via ORAL
  Filled 2013-05-26 (×3): qty 1

## 2013-05-26 MED ORDER — LOPERAMIDE HCL 2 MG PO CAPS: 2.0000 mg | ORAL_CAPSULE | ORAL | Status: DC | PRN

## 2013-05-26 MED ORDER — THIAMINE HCL 100 MG/ML IJ SOLN: 100.0000 mg | Freq: Once | INTRAMUSCULAR | Status: DC

## 2013-05-26 NOTE — [facility] Counselor (Signed)
Writer is still waiting to obtain BAL lab results to include in the assessment.

## 2013-05-26 NOTE — ED Notes (Signed)
When RN opened container of pt's belongings to obtain pt's cell phone as she requested so may get phone number of her sponsor, belongings that are to be locked up w/security noted and removed. Documented items and given to security.

## 2013-05-26 NOTE — Progress Notes (Signed)
Spoke with patient Re PCP- as not listed.Patient reports she is in process of getting a new PCP- and that she can see her old Dr at [address] [facility] in the meantime.Education provided Re- PCP resources. Patient verbalized her understanding and had no questions.

## 2013-05-26 NOTE — [facility] Counselor (Signed)
This pt has been accepted to [facility] for treatment, attending is Dr. Elsie Saas, [phone].  This Clinical research associate called pt.'s nurse--Kim to inform of acceptance and support paperwork faxed to [facility] ED, to be completed by nurse.

## 2013-05-26 NOTE — ED Provider Notes (Signed)
Medical screening examination/treatment/procedure(s) were performed by non-physician practitioner and as supervising physician I was immediately available for consultation/collaboration.   Acey Woodfield B. Prescott Truex, MD 05/26/13 1539 

## 2013-05-26 NOTE — ED Provider Notes (Signed)
Pt accepted by [facility] and Pt agrees, calm, cooperative. 2120  Hurman Horn, MD 05/27/13 [phone]

## 2013-05-26 NOTE — ED Provider Notes (Signed)
Requesting detox form alcohol . Has tried detoxing at home.withiout susccess  Doug Sou, MD 05/26/13 [phone]

## 2013-05-27 DIAGNOSIS — F1020 Alcohol dependence, uncomplicated: Secondary | ICD-10-CM

## 2013-05-27 LAB — GLUCOSE, CAPILLARY: Glucose-Capillary: 177 mg/dL — ABNORMAL HIGH (ref 70–99)

## 2013-05-27 NOTE — Progress Notes (Signed)
Patient did attend the evening speaker AA meeting.  

## 2013-05-27 NOTE — [facility] Suicide Risk Assessment (Addendum)
Suicide Risk Assessment  Admission Assessment     Nursing information obtained from:  Patient Demographic factors:  Caucasian;Unemployed Current Mental Status:  NA Loss Factors:  Loss of significant relationship Historical Factors:  Family history of mental illness or substance abuse;Anniversary of important loss Risk Reduction Factors:  Sense of responsibility to family;Living with another person, especially a relative  CLINICAL FACTORS:   Depression:   Anhedonia Comorbid alcohol abuse/dependence Hopelessness Severe Alcohol/Substance Abuse/Dependencies  COGNITIVE FEATURES THAT CONTRIBUTE TO RISK:  Closed-mindedness Polarized thinking Thought constriction (tunnel vision)    SUICIDE RISK:   Minimal: No identifiable suicidal ideation.  Patients presenting with no risk factors but with morbid ruminations; may be classified as minimal risk based on the severity of the depressive symptoms  PLAN OF CARE: Assessment:    AXIS I:  Alcohol Dependence AXIS II:  No diagnosis AXIS III:   Past Medical History  Diagnosis Date  . Alcohol abuse   . Diabetes mellitus   . Hypertension   . Fibromyalgia   . Depression   . Carotid artery stenosis    AXIS IV:  other psychosocial or environmental problems and problems with primary support group AXIS V:  21-30 behavior considerably influenced by delusions or hallucinations OR serious impairment in judgment, communication OR inability to function in almost all areas  Treatment Plan/Recommendations:  As noted below Treatment Plan Summary: Daily contact with patient to assess and evaluate symptoms and progress in treatment Medication management Current Medications:  Current Facility-Administered Medications  Medication Dose Route Frequency Provider Last Rate Last Dose  . acetaminophen (TYLENOL) tablet 650 mg  650 mg Oral Q6H PRN Larena Sox, MD      . alum & mag hydroxide-simeth (MAALOX/MYLANTA) 200-200-20 MG/5ML suspension 30 mL  30 mL  Oral Q4H PRN Larena Sox, MD      . chlordiazePOXIDE (LIBRIUM) capsule 25 mg  25 mg Oral Q6H PRN Larena Sox, MD      . chlordiazePOXIDE (LIBRIUM) capsule 25 mg  25 mg Oral QID Larena Sox, MD       Followed by  . [START ON 05/28/2013] chlordiazePOXIDE (LIBRIUM) capsule 25 mg  25 mg Oral TID Larena Sox, MD       Followed by  . [START ON 05/29/2013] chlordiazePOXIDE (LIBRIUM) capsule 25 mg  25 mg Oral BH-qamhs Larena Sox, MD       Followed by  . [START ON 05/31/2013] chlordiazePOXIDE (LIBRIUM) capsule 25 mg  25 mg Oral Daily Larena Sox, MD      . diphenoxylate-atropine (LOMOTIL) 2.5-0.025 MG per tablet 2 tablet  2 tablet Oral BID Larena Sox, MD   2 tablet at 05/26/13 2337  . escitalopram (LEXAPRO) tablet 20 mg  20 mg Oral Daily Larena Sox, MD      . ezetimibe (ZETIA) tablet 10 mg  10 mg Oral Daily Larena Sox, MD      . hydrOXYzine (ATARAX/VISTARIL) tablet 25 mg  25 mg Oral Q6H PRN Larena Sox, MD      . hydrOXYzine (ATARAX/VISTARIL) tablet 25 mg  25 mg Oral QHS PRN Larena Sox, MD      . lisinopril (PRINIVIL,ZESTRIL) tablet 5 mg  5 mg Oral Daily Larena Sox, MD      . loperamide (IMODIUM) capsule 2-4 mg  2-4 mg Oral PRN Larena Sox, MD      . magnesium hydroxide (MILK OF MAGNESIA) suspension 30 mL  30 mL Oral Daily  PRN Larena Sox, MD      . multivitamin with minerals tablet 1 tablet  1 tablet Oral Daily Larena Sox, MD      . nebivolol (BYSTOLIC) tablet 5 mg  5 mg Oral Daily Larena Sox, MD      . ondansetron (ZOFRAN-ODT) disintegrating tablet 4 mg  4 mg Oral Q6H PRN Larena Sox, MD   4 mg at 05/26/13 2339  . ondansetron (ZOFRAN-ODT) disintegrating tablet 8 mg  8 mg Oral Q8H PRN Larena Sox, MD      . pantoprazole (PROTONIX) EC tablet 40 mg  40 mg Oral Daily Larena Sox, MD      . thiamine (B-1) injection 100 mg  100 mg Intramuscular Once Larena Sox, MD      . thiamine (VITAMIN B-1) tablet  100 mg  100 mg Oral Daily Larena Sox, MD        Observation Level/Precautions:  Detox Fall  Laboratory:  Reviewed  Psychotherapy:  Individual and group therapy  Medications:  Continue detox protocol, continue celexa  Consultations:  None  Discharge Concerns:  Relapse, poor social support  Estimated LOS: 5-7 days  Other:  Would recommend residential treatment after detox. Made patient aware that detox was about 5 days,   I certify that inpatient services furnished can reasonably be expected to improve the patient's condition.  Nyelli Samara 05/27/2013, 9:00 AM

## 2013-05-27 NOTE — H&P (Signed)
Psychiatric Admission Assessment Adult Patient Identification:  Wilhemina Bonito Date of Evaluation:  05/27/2013 Chief Complaint:  ETOH DEPENDENCE MDD History of Present Illness:   Ms. Southard is a  67 y/o female with a past psychiatric history significant for alcohol abuse.  The patient has had several relapses over the past year and reported doing well until about one month ago.  She reports she started drinking again up to 12 drinks per day, then tried to taper herself but became sick in the process which led her husband to bring her to the ED.  She reports that she was prescribed Adderall to help with ADD but she states it helps with not drinking, though she had relapses with Adderall. The patient's daughter is on methadone and living with the patient, and she her husband is helping with grandchildren.  Elements:  Location:  Inpatient unit. Quality:  Physical symptoms from detox. Severity:  Severe. Timing:  Constant. Duration:  One month with regards to recent symptoms. Context:  All aspect of life. Associated Signs/Symptoms: Depression Symptoms:  depressed mood, anhedonia, feelings of worthlessness/guilt, hypersomnia, loss of energy/fatigue, (Hypo) Manic Symptoms: NONE Anxiety Symptoms:  Excessive Worry, Psychotic Symptoms: Patient denies PTSD Symptoms: Had a traumatic exposure:  Yes. Re-experiencing:  Intrusive Thoughts Nightmares Hypervigilance:  No Hyperarousal:  None Avoidance:  None  Psychiatric Specialty Exam: Physical Exam  Nursing note and vitals reviewed. Constitutional: She appears well-developed and well-nourished. No distress.  Skin: She is not diaphoretic.  I have seen and examined the patient and reviewed the ED note and physical exam performed by Magnus Sinning, PA-C on 05/25/2013 and agree with the finding.   Review of Systems  Constitutional: Negative for fever, chills, weight loss and malaise/fatigue.  Respiratory: Negative for cough, hemoptysis, sputum  production, shortness of breath and wheezing.   Cardiovascular: Negative for chest pain, palpitations, leg swelling and PND.  Gastrointestinal: Negative for heartburn, nausea, vomiting, abdominal pain, diarrhea and constipation.  Neurological: Negative for dizziness, tingling, tremors, sensory change, speech change and focal weakness.    Blood pressure 91/61, pulse 108, temperature 98.4 F (36.9 C), temperature source Oral, resp. rate 16, height 5' 2.5" (1.588 m), weight 73.483 kg (162 lb).Body mass index is 29.14 kg/(m^2).  General Appearance: Casual and Fairly Groomed  Eye Contact::  Good  Speech:  Clear and Coherent and Normal Rate  Volume:  Increased  Mood:  Depressed and Dysphoric  Affect:  Appropriate, Congruent and Full Range  Thought Process:  Coherent, Linear and Logical  Orientation:  Full (Time, Place, and Person)  Thought Content:  WDL  Suicidal Thoughts:  Patient contracts for safety in the hospital  Homicidal Thoughts:  No  Memory:  Immediate;   Good Recent;   Fair Remote;   Fair  Judgement:  Impaired  Insight:  Lacking  Psychomotor Activity:  Normal  Concentration:  Fair  Recall:  Fair  Akathisia:  No  Handed:  Right  AIMS (if indicated):   Not indicate  Assets:  Communication Skills Desire for Improvement  Sleep:  Number of Hours: 5.5    Past Psychiatric History: Diagnosis:  Alcohol Dependence  Hospitalizations:  [facility] most recently for detox  Outpatient Care:  Yes. Dr. Evelene Croon  Substance Abuse Care:  [facility], [facility], [facility] for my alcohol treatments"    Self-Mutilation:  Patient denies  Suicidal Attempts:  Patient denies  Violent Behaviors:  Patient denies   Past Medical History:   Past Medical History  Diagnosis Date  .  Alcohol abuse   . Diabetes mellitus   . Hypertension   . Fibromyalgia   . Depression   . Carotid artery stenosis    Loss of Consciousness:  Yes Seizure History:  Patient denies Cardiac  History:  Hypertension, Hyperlipidemia, DM  Allergies:   Allergies  Allergen Reactions  . Other Anaphylaxis    shellfish  . Latex Other (See Comments)    Break outs  . Lipitor (Atorvastatin Calcium) Other (See Comments)    Muscle aches  . Shellfish Allergy   . Codeine Rash    And nausea   . Pentazocine Lactate Rash  . Sulfonamide Derivatives Rash   PTA Medications: Prescriptions prior to admission  Medication Sig Dispense Refill  . amphetamine-dextroamphetamine (ADDERALL) 30 MG tablet Take 60 mg by mouth every morning.      . diphenoxylate-atropine (LOMOTIL) 2.5-0.025 MG per tablet Take 2 tablets by mouth 2 (two) times daily as needed for diarrhea or loose stools.       Marland Kitchen escitalopram (LEXAPRO) 20 MG tablet Take 20 mg by mouth daily.      Marland Kitchen ezetimibe (ZETIA) 10 MG tablet Take 10 mg by mouth daily.      Marland Kitchen lisinopril (PRINIVIL,ZESTRIL) 5 MG tablet Take 5 mg by mouth daily.       . nebivolol (BYSTOLIC) 5 MG tablet Take 5 mg by mouth daily.      . Omega-3 Fatty Acids (FISH OIL) 1000 MG CPDR Take 4 capsules by mouth at bedtime.      . ondansetron (ZOFRAN-ODT) 8 MG disintegrating tablet Take 1 tablet (8 mg total) by mouth every 12 (twelve) hours as needed for nausea.  20 tablet  0  . pantoprazole (PROTONIX) 40 MG tablet Take 1 tablet (40 mg total) by mouth daily.  14 tablet  0    Previous Psychotropic Medications:  Medication/Dose  Citalopram   Substance Abuse History in the last 12 months:  yes  Consequences of Substance Abuse: Medical Consequences:  Yes Legal Consequences:  Yes Family Consequences:  Yes  Social History:  reports that she has been smoking.  She has never used smokeless tobacco. She reports that  drinks alcohol. She reports that she does not use illicit drugs. Additional Social History: Current Place of Residence:  [address] of Birth: [place] Family Members: In the process of separation and divorce Children: 1 deceased one living  Sons:1  (deceased-suicide)  Daughters:1 Relationships: Husband is her main source of support School History:   BA in Nursing Legal History: None reported Employment: Retired Engineer, civil (consulting). Hobbies/Interests: None  Family History:  History reviewed. No pertinent family history.  Results for orders placed during the hospital encounter of 05/25/13 (from the past 72 hour(s))  URINE RAPID DRUG SCREEN (HOSP PERFORMED)     Status: None   Collection Time    05/25/13  8:16 PM      Result Value Range   Opiates NONE DETECTED  NONE DETECTED   Cocaine NONE DETECTED  NONE DETECTED   Benzodiazepines NONE DETECTED  NONE DETECTED   Amphetamines NONE DETECTED  NONE DETECTED   Tetrahydrocannabinol NONE DETECTED  NONE DETECTED   Barbiturates NONE DETECTED  NONE DETECTED   Comment:            DRUG SCREEN FOR MEDICAL PURPOSES     ONLY.  IF CONFIRMATION IS NEEDED     FOR ANY PURPOSE, NOTIFY LAB     WITHIN 5 DAYS.  LOWEST DETECTABLE LIMITS     FOR URINE DRUG SCREEN     Drug Class       Cutoff (ng/mL)     Amphetamine      1000     Barbiturate      200     Benzodiazepine   200     Tricyclics       300     Opiates          300     Cocaine          300     THC              50  ETHANOL     Status: Abnormal   Collection Time    05/25/13 11:23 PM      Result Value Range   Alcohol, Ethyl (B) 181 (*) 0 - 11 mg/dL   Comment:            LOWEST DETECTABLE LIMIT FOR     SERUM ALCOHOL IS 11 mg/dL     FOR MEDICAL PURPOSES ONLY  CBC WITH DIFFERENTIAL     Status: None   Collection Time    05/25/13 11:23 PM      Result Value Range   WBC 6.3  4.0 - 10.5 K/uL   RBC 4.10  3.87 - 5.11 MIL/uL   Hemoglobin 13.1  12.0 - 15.0 g/dL   HCT 30.8  65.7 - 84.6 %   MCV 92.2  78.0 - 100.0 fL   MCH 32.0  26.0 - 34.0 pg   MCHC 34.7  30.0 - 36.0 g/dL   RDW 96.2  95.2 - 84.1 %   Platelets 215  150 - 400 K/uL   Neutrophils Relative % 50  43 - 77 %   Neutro Abs 3.1  1.7 - 7.7 K/uL   Lymphocytes Relative 43  12 - 46 %    Lymphs Abs 2.7  0.7 - 4.0 K/uL   Monocytes Relative 6  3 - 12 %   Monocytes Absolute 0.4  0.1 - 1.0 K/uL   Eosinophils Relative 1  0 - 5 %   Eosinophils Absolute 0.0  0.0 - 0.7 K/uL   Basophils Relative 1  0 - 1 %   Basophils Absolute 0.0  0.0 - 0.1 K/uL  COMPREHENSIVE METABOLIC PANEL     Status: Abnormal   Collection Time    05/25/13 11:23 PM      Result Value Range   Sodium 136  135 - 145 mEq/L   Potassium 4.7  3.5 - 5.1 mEq/L   Chloride 96  96 - 112 mEq/L   CO2 25  19 - 32 mEq/L   Glucose, Bld 190 (*) 70 - 99 mg/dL   BUN 14  6 - 23 mg/dL   Creatinine, Ser 3.24  0.50 - 1.10 mg/dL   Calcium 8.8  8.4 - 40.1 mg/dL   Total Protein 6.9  6.0 - 8.3 g/dL   Albumin 3.6  3.5 - 5.2 g/dL   AST 16  0 - 37 U/L   ALT 14  0 - 35 U/L   Alkaline Phosphatase 94  39 - 117 U/L   Total Bilirubin 0.2 (*) 0.3 - 1.2 mg/dL   GFR calc non Af Amer 88 (*) >90 mL/min   GFR calc Af Amer >90  >90 mL/min   Comment:            The eGFR has been calculated  using the CKD EPI equation.     This calculation has not been     validated in all clinical     situations.     eGFR's persistently     <90 mL/min signify     possible Chronic Kidney Disease.   Psychological Evaluations: NOne  Assessment:    AXIS I:  Alcohol Dependence AXIS II:  No diagnosis AXIS III:   Past Medical History  Diagnosis Date  . Alcohol abuse   . Diabetes mellitus   . Hypertension   . Fibromyalgia   . Depression   . Carotid artery stenosis    AXIS IV:  other psychosocial or environmental problems and problems with primary support group AXIS V:  21-30 behavior considerably influenced by delusions or hallucinations OR serious impairment in judgment, communication OR inability to function in almost all areas  Treatment Plan/Recommendations:  As noted below Treatment Plan Summary: Daily contact with patient to assess and evaluate symptoms and progress in treatment Medication management Current Medications:  Current  Facility-Administered Medications  Medication Dose Route Frequency Provider Last Rate Last Dose  . acetaminophen (TYLENOL) tablet 650 mg  650 mg Oral Q6H PRN Larena Sox, MD      . alum & mag hydroxide-simeth (MAALOX/MYLANTA) 200-200-20 MG/5ML suspension 30 mL  30 mL Oral Q4H PRN Larena Sox, MD      . chlordiazePOXIDE (LIBRIUM) capsule 25 mg  25 mg Oral Q6H PRN Larena Sox, MD      . chlordiazePOXIDE (LIBRIUM) capsule 25 mg  25 mg Oral QID Larena Sox, MD       Followed by  . [START ON 05/28/2013] chlordiazePOXIDE (LIBRIUM) capsule 25 mg  25 mg Oral TID Larena Sox, MD       Followed by  . [START ON 05/29/2013] chlordiazePOXIDE (LIBRIUM) capsule 25 mg  25 mg Oral BH-qamhs Larena Sox, MD       Followed by  . [START ON 05/31/2013] chlordiazePOXIDE (LIBRIUM) capsule 25 mg  25 mg Oral Daily Larena Sox, MD      . diphenoxylate-atropine (LOMOTIL) 2.5-0.025 MG per tablet 2 tablet  2 tablet Oral BID Larena Sox, MD   2 tablet at 05/26/13 2337  . escitalopram (LEXAPRO) tablet 20 mg  20 mg Oral Daily Larena Sox, MD      . ezetimibe (ZETIA) tablet 10 mg  10 mg Oral Daily Larena Sox, MD      . hydrOXYzine (ATARAX/VISTARIL) tablet 25 mg  25 mg Oral Q6H PRN Larena Sox, MD      . hydrOXYzine (ATARAX/VISTARIL) tablet 25 mg  25 mg Oral QHS PRN Larena Sox, MD      . lisinopril (PRINIVIL,ZESTRIL) tablet 5 mg  5 mg Oral Daily Larena Sox, MD      . loperamide (IMODIUM) capsule 2-4 mg  2-4 mg Oral PRN Larena Sox, MD      . magnesium hydroxide (MILK OF MAGNESIA) suspension 30 mL  30 mL Oral Daily PRN Larena Sox, MD      . multivitamin with minerals tablet 1 tablet  1 tablet Oral Daily Larena Sox, MD      . nebivolol (BYSTOLIC) tablet 5 mg  5 mg Oral Daily Larena Sox, MD      . ondansetron (ZOFRAN-ODT) disintegrating tablet 4 mg  4 mg Oral Q6H PRN Larena Sox, MD   4 mg at 05/26/13 2339  . ondansetron (ZOFRAN-ODT)  disintegrating tablet 8 mg  8 mg Oral Q8H PRN Larena Sox, MD      . pantoprazole (PROTONIX) EC tablet 40 mg  40 mg Oral Daily Larena Sox, MD      . thiamine (B-1) injection 100 mg  100 mg Intramuscular Once Larena Sox, MD      . thiamine (VITAMIN B-1) tablet 100 mg  100 mg Oral Daily Larena Sox, MD        Observation Level/Precautions:  Detox Fall  Laboratory:  Reviewed  Psychotherapy:  Individual and group therapy  Medications:  Continue detox protocol, Continue Escitalopram -20 mg-this was increased about one month ago but she had been drinking.  Consultations:  None  Discharge Concerns:  Relapse, poor social support  Estimated LOS: 5-7 days  Other:  Would recommend residential treatment after detox   I certify that inpatient services furnished can reasonably be expected to improve the patient's condition.  Khilynn Borntreger 6/15/20149:01 AM

## 2013-05-27 NOTE — [facility] Group Notes (Signed)
[facility] LCSW Group Therapy  05/27/2013  11:00  Type of Therapy:  Group Therapy  Participation Level: Did not attend    Summary of Progress/Problems:   The main focus of today's process group was to identify the patient's current support system and decide on other supports that can be put in place. Four definitions/levels of support were discussed and an exercise was utilized to show how much stronger we become with additional supports. An emphasis was placed on using counselor, doctor, therapy groups, 12-step groups, and problem-specific support groups to expand supports, as well as doing something different than has been done before.   Rod Swede Heaven, LCSW 05/27/2013 1:08 PM

## 2013-05-27 NOTE — Progress Notes (Signed)
Patient ID: [name], female   DOB: 29-Jun-1946, 67 y.o.   MRN: [id number] This 67 yo cauc.female states she was here several times last year for detox, and had been doing fairly well until approx. A month ago began drinking again which escalated to 2-3 4 pks/ day, had tried to taper herself off but states got sick and her husband brought her to the ED.  States they are separating, she was living alone but her daughter and her children have moved in with her.  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   States medical hx includes diabetes, which is diet controlled, HTN, hyperlipidemia, fibromyalgia, colitis, and chronic depression.  Surgical hx includes tonsillectomy, bunionectomy, left foot, c-section in '78, urethrotomy.  States needs bunionectomy on right foot as well, but currently denies pain. Was started on the librium protocol, given a meal, and went to bed shortly after.  Denies feeling si/hi/avh.  Was oriented to unit, rules, schedule.  Was tired and wanted to go to bed.  Able to contract for safety.  Will continue to monitor for safety, currently safe on unit.

## 2013-05-27 NOTE — Progress Notes (Signed)
D. Pt has been visible in milieu, attending and participating in some activities. Pt does not display any overt signs or symptoms of withdrawal, does complain of some mild anxiety. Pt did speak some about drinking and about her relaspses. Pt has not verbalized any complaints of pain today and has received medications without incident. A. Support and encouragement provided, medication education completed. R. Pt verbalized understanding, will continue to monitor.

## 2013-05-27 NOTE — Progress Notes (Signed)
Patient ID: [name], female   DOB: 1946-07-20, 67 y.o.   MRN: [id number] D)  States feeling a little better today, smiled, pleasant, and on her way to go to AA group.  Seemed happy to have been moved to [address] hall.  No c/o's voiced at this time. A)  Encouragement, support, will continue to monitor for safety, continuePOC R)  Seems vested, participating, safety maintained.
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
## 2013-05-28 DIAGNOSIS — F1994 Other psychoactive substance use, unspecified with psychoactive substance-induced mood disorder: Secondary | ICD-10-CM

## 2013-05-28 LAB — GLUCOSE, CAPILLARY
Glucose-Capillary: 166 mg/dL — ABNORMAL HIGH (ref 70–99)
Glucose-Capillary: 184 mg/dL — ABNORMAL HIGH (ref 70–99)

## 2013-05-28 MED ORDER — TRAZODONE HCL 50 MG PO TABS
50.0000 mg | ORAL_TABLET | Freq: Every evening | ORAL | Status: DC | PRN
  Administered 2013-05-28 – 2013-05-29 (×3): 50 mg via ORAL
  Filled 2013-05-28 (×4): qty 1
  Filled 2013-05-28: qty 8
  Filled 2013-05-28: qty 1

## 2013-05-28 NOTE — [facility] Group Notes (Signed)
[facility] LCSW Aftercare Discharge Planning Group Note   05/28/2013 11:59 AM  Participation Quality:  Appropriate   Mood/Affect:  Appropriate  Depression Rating:  3  Anxiety Rating:  1  Thoughts of Suicide:  No Will you contract for safety?   NA  Current AVH:  No  Plan for Discharge/Comments:  Pt stated that she is going to [employer] with her family on Thursday. She follows up with private psych and therapist.--will attempt to get this info from pt privately and schedule appts as she did not wish to share in front of the group. Pt states that she lives in [facility] and will return home after d/c attend AA--has sponsor.   Transportation Means: family  Supports: family  Smart, [place]

## 2013-05-28 NOTE — [facility] Suicide Risk Assessment (Signed)
  [facility] INPATIENT: Family/Significant Other Suicide Prevention Education  Suicide Prevention Education:  Education Completed; No one has been identified by the patient as the family member/significant other with whom the patient will be residing, and identified as the person(s) who will aid the patient in the event of a mental health crisis (suicidal ideations/suicide attempt). With written consent from the patient, the family member/significant other has been provided the following suicide prevention education, prior to the and/or following the discharge of the patient.  The suicide prevention education provided includes the following:  Suicide risk factors  Suicide prevention and interventions  National Suicide Hotline telephone number  [facility] assessment telephone number  [facility] Emergency Assistance 911  [facility] and/or Residential Mobile Crisis Unit telephone number Request made of family/significant other to:  Remove weapons (e.g., guns, rifles, knives), all items previously/currently identified as safety concern.  Remove drugs/medications (over-the-counter, prescriptions, illicit drugs), all items previously/currently identified as a safety concern. The family member/significant other verbalizes understanding of the suicide prevention education information provided. The family member/significant other agrees to remove the items of safety concern listed above.   Pt did not c/o SI at admission, nor have they endorsed SI during their stay here. SPE not required.  Marvell Stavola Smart LCSWA 05/28/2013 8:27 AM

## 2013-05-28 NOTE — Progress Notes (Signed)
Adult Psychoeducational Group Note  Date:  05/28/2013 Time:  11:00AM  Group Topic/Focus:  Self Care:   The focus of this group is to help patients understand the importance of self-care in order to improve or restore emotional, physical, spiritual, interpersonal, and financial health.  Participation Level:  Active  Participation Quality:  Appropriate  Affect:  Appropriate and Flat  Cognitive:  Appropriate  Insight: Appropriate  Engagement in Group:  Engaged  Modes of Intervention:  Discussion  Additional Comments:  Pt participated in group discussion and was active throughout group  Elai Vanwyk K 05/28/2013, 1:03 PM

## 2013-05-28 NOTE — Progress Notes (Signed)
D- Patient is out in milieu interacting with peers and attending groups with active participation.  Denies SI. Denies overt s/s of withdrawal.  Compliant with scheduled medications and no prn's requested.  A-  Support and encouragement given. Medication education reenforced.  Continue POC and evaluation of treatment goals.  Continue 15' checks for safety.  R- Safety maintained.

## 2013-05-28 NOTE — [facility] Group Notes (Signed)
[facility] LCSW Group Therapy  05/28/2013 4:35 PM  Type of Therapy:  Group Therapy  Participation Level:  Active  Participation Quality:  Appropriate  Affect:  Appropriate  Cognitive:  Appropriate  Insight:  Engaged  Engagement in Therapy:  Engaged  Modes of Intervention:  Discussion, Education, Exploration, Socialization and Support  Summary of Progress/Problems: Today's Topic: Overcoming Obstacles. Pt identified obstacles faced currently and processed barriers involved in overcoming these obstacles. Pt identified steps necessary for overcoming these obstacles and explored motivation (internal and external) for facing these difficulties head on. Pt further identified one area of concern in their lives and chose a skill of focus pulled from their "toolbox." [name] talked about financial issues and marital issues in terms of life obstacles that she anticipates facing upon discharge. [name] was encouraged to explore how should will handle these issues and processed ways to cope with life stressors in a positive way. She stated that making time for herself, prioritizing bills and managing her funds would help her overcome these obstacles.    Smart, Truman Aceituno 05/28/2013, 4:35 PM

## 2013-05-28 NOTE — Progress Notes (Signed)
[facility] MD Progress Note  05/28/2013 2:22 PM [name]  MRN:  [id number]  Subjective:  [name] reports, "I relapsed 2 weeks ago after months of sobriety from alcohol. Well I also got separated from my husband, part of the problem leading to my relapse. I attempted to detox myself at home, but it got to be feeling very badly. The shakes became too much to handle. That was how I came up here. I'm doing much better, but not quite out of woods yet. Still some tremors going on. Sleep remain a struggle still". Denies SIHI, AVH.  Diagnosis:   Axis I: Alcohol dependence with physiological dependence, Psychoactive substance organic mood disorder Axis II: Deferred Axis III:  Past Medical History  Diagnosis Date  . Alcohol abuse   . Diabetes mellitus   . Hypertension   . Fibromyalgia   . Depression   . Carotid artery stenosis    Axis IV: Alcoholism Axis V: 41-50 serious symptoms  ADL's:  Intact  Sleep: Fair  Appetite:  Fair  Suicidal Ideation:  Plan:  Denies Intent:  Denies Means:  Denies Homicidal Ideation:  Plan:  Denies Intent:  Denies Means:  Denies  AEB (as evidenced by):  Psychiatric Specialty Exam: Review of Systems  Constitutional: Negative.   HENT: Negative.   Eyes: Negative.   Respiratory: Negative.   Cardiovascular: Negative.   Gastrointestinal: Negative.   Genitourinary: Negative.   Musculoskeletal: Negative.   Skin: Negative.   Neurological: Negative.   Endo/Heme/Allergies: Negative.   Psychiatric/Behavioral: Positive for depression (Currently being stabilized with medication) and substance abuse (Alcohol dependence). Negative for suicidal ideas, hallucinations and memory loss. The patient is nervous/anxious (Currently being stabilized with medication) and has insomnia (Currently being stabilized with medication).     Blood pressure 121/76, pulse 74, temperature 97.6 F (36.4 C), temperature source Oral, resp. rate 16, height 5' 2.5" (1.588 m), weight 73.483  kg (162 lb).Body mass index is 29.14 kg/(m^2).  General Appearance: Fairly Groomed  Patent attorney::  Good  Speech:  Clear and Coherent  Volume:  Normal  Mood:  Anxious and Depressed  Affect:  Congruent  Thought Process:  Coherent and Intact  Orientation:  Full (Time, Place, and Person)  Thought Content:  Rumination  Suicidal Thoughts:  No  Homicidal Thoughts:  No  Memory:  Immediate;   Good Recent;   Good Remote;   Good  Judgement:  Fair  Insight:  Fair  Psychomotor Activity:  Restlessness  Concentration:  Fair  Recall:  Good  Akathisia:  No  Handed:  Right  AIMS (if indicated):     Assets:  Desire for Improvement  Sleep:  Number of Hours: 5.25   Current Medications: Current Facility-Administered Medications  Medication Dose Route Frequency Provider Last Rate Last Dose  . acetaminophen (TYLENOL) tablet 650 mg  650 mg Oral Q6H PRN Larena Sox, MD      . alum & mag hydroxide-simeth (MAALOX/MYLANTA) 200-200-20 MG/5ML suspension 30 mL  30 mL Oral Q4H PRN Larena Sox, MD      . chlordiazePOXIDE (LIBRIUM) capsule 25 mg  25 mg Oral Q6H PRN Larena Sox, MD   25 mg at 05/28/13 0201  . chlordiazePOXIDE (LIBRIUM) capsule 25 mg  25 mg Oral TID Larena Sox, MD   25 mg at 05/28/13 1204   Followed by  . [START ON 05/29/2013] chlordiazePOXIDE (LIBRIUM) capsule 25 mg  25 mg Oral BH-qamhs Larena Sox, MD       Followed by  . [  START ON 05/31/2013] chlordiazePOXIDE (LIBRIUM) capsule 25 mg  25 mg Oral Daily Larena Sox, MD      . diphenoxylate-atropine (LOMOTIL) 2.5-0.025 MG per tablet 2 tablet  2 tablet Oral BID Larena Sox, MD   2 tablet at 05/27/13 1645  . escitalopram (LEXAPRO) tablet 20 mg  20 mg Oral Daily Larena Sox, MD   20 mg at 05/28/13 0844  . ezetimibe (ZETIA) tablet 10 mg  10 mg Oral Daily Larena Sox, MD   10 mg at 05/28/13 0844  . hydrOXYzine (ATARAX/VISTARIL) tablet 25 mg  25 mg Oral Q6H PRN Larena Sox, MD      . hydrOXYzine  (ATARAX/VISTARIL) tablet 25 mg  25 mg Oral QHS PRN Larena Sox, MD      . lisinopril (PRINIVIL,ZESTRIL) tablet 5 mg  5 mg Oral Daily Larena Sox, MD   5 mg at 05/28/13 0844  . loperamide (IMODIUM) capsule 2-4 mg  2-4 mg Oral PRN Larena Sox, MD      . magnesium hydroxide (MILK OF MAGNESIA) suspension 30 mL  30 mL Oral Daily PRN Larena Sox, MD      . multivitamin with minerals tablet 1 tablet  1 tablet Oral Daily Larena Sox, MD   1 tablet at 05/28/13 0844  . nebivolol (BYSTOLIC) tablet 5 mg  5 mg Oral Daily Larena Sox, MD   5 mg at 05/28/13 0844  . ondansetron (ZOFRAN-ODT) disintegrating tablet 4 mg  4 mg Oral Q6H PRN Larena Sox, MD   4 mg at 05/26/13 2339  . ondansetron (ZOFRAN-ODT) disintegrating tablet 8 mg  8 mg Oral Q8H PRN Larena Sox, MD      . pantoprazole (PROTONIX) EC tablet 40 mg  40 mg Oral Daily Larena Sox, MD   40 mg at 05/28/13 0844  . thiamine (B-1) injection 100 mg  100 mg Intramuscular Once Larena Sox, MD      . thiamine (VITAMIN B-1) tablet 100 mg  100 mg Oral Daily Larena Sox, MD   100 mg at 05/28/13 0844  . traZODone (DESYREL) tablet 50 mg  50 mg Oral QHS PRN,MR X 1 Court Joy, PA-C   50 mg at 05/28/13 0201    Lab Results:  Results for orders placed during the hospital encounter of 05/26/13 (from the past 48 hour(s))  GLUCOSE, CAPILLARY     Status: Abnormal   Collection Time    05/27/13  5:04 PM      Result Value Range   Glucose-Capillary 177 (*) 70 - 99 mg/dL  GLUCOSE, CAPILLARY     Status: Abnormal   Collection Time    05/28/13  6:28 AM      Result Value Range   Glucose-Capillary 166 (*) 70 - 99 mg/dL   Comment 1 Notify RN     Comment 2 Documented in Chart    GLUCOSE, CAPILLARY     Status: Abnormal   Collection Time    05/28/13 12:15 PM      Result Value Range   Glucose-Capillary 184 (*) 70 - 99 mg/dL   Comment 1 Documented in Chart     Comment 2 Notify RN      Physical Findings: AIMS:  Facial and Oral Movements Muscles of Facial Expression: None, normal Lips and Perioral Area: None, normal Jaw: None, normal Tongue: None, normal,Extremity Movements Upper (arms, wrists, hands, fingers): None, normal Lower (legs, knees, ankles, toes): None, normal,  Trunk Movements Neck, shoulders, hips: None, normal, Overall Severity Severity of abnormal movements (highest score from questions above): None, normal Incapacitation due to abnormal movements: None, normal Patient's awareness of abnormal movements (rate only patient's report): No Awareness, Dental Status Current problems with teeth and/or dentures?: No Does patient usually wear dentures?: No  CIWA:  CIWA-Ar Total: 2 COWS:     Treatment Plan Summary: Daily contact with patient to assess and evaluate symptoms and progress in treatment Medication management  Plan: Supportive approach/coping skills/relapse prevention. Encouraged out of room, participation in group sessions and application of coping skills when distressed. Will continue to monitor response to/adverse effects of medications in use to assure effectiveness. Continue to monitor mood, behavior and interaction with staff and other patients. Continue current plan of care.  Medical Decision Making Problem Points:  Established problem, stable/improving (1), Review of last therapy session (1) and Review of psycho-social stressors (1) Data Points:  Review of medication regiment & side effects (2) Review of new medications or change in dosage (2)  I certify that inpatient services furnished can reasonably be expected to improve the patient's condition.   Armandina Stammer I, PMHNP-BC 05/28/2013, 2:22 PM

## 2013-05-28 NOTE — Tx Team (Signed)
Interdisciplinary Treatment Plan Update (Adult)  Date:  Time Reviewed: Progress in Treatment:  Attending groups: Yes Participating in groups:  Yes Taking medication as prescribed: Yes  Tolerating medication: Yes  Family/Significant othe contact made: No  Patient understands diagnosis: Yes, AEB seeking treatment for substance abuse, detox, and depressive symptoms.  Discussing patient identified problems/goals with staff: Yes  Medical problems stabilized or resolved: Yes  Denies suicidal/homicidal ideation: Yes  Patient has not harmed self or Others: Yes  New problem(s) identified: n/a  Discharge Plan or Barriers: Pt plans to continue seeing her private therapist and psychiatrist. She plans to get back in touch with her sponsor and continue to attend AA meetings.  Additional comments: Patient is a 67 year old white female. Patient requests detox from alcohol. . Patient reports that she relapsed 2 weeks ago due and went on a 4 day binge drinking due to her husband leaving her. Patient reports that she drinks eight small bottles of wine daily. Patient has a CIWA score of 11. Patient reports that her last drink was approximately one hour prior to arrival in the ED. Patient reports the following withdrawal symptoms chills, sweats, slight nausea, feeling jittery and agitation. Patient reports a past history of detox and substance abuse treatment over the past 10years. Patient was unsure of the dates of detox and treatment. Patient reports that her longest period of sobriety was 4 1/2 year ago. Patient reports that she has been drinking for over 30 years. Patient reports a prior history of outpatient therapy and medication management. Patient reports that she is compliant with taking her medication. Patient reports that she has a previous diagnosis of Major Depressive Disorder and PTSD. Patient denies psychosis. Patient denies SI or HI. Reason for Continuation of  Hospitalization: Detox Withdrawals Depressive symptoms Estimated length of stay: 1-2 days  For review of initial/current patient goals, please see plan of care.  Attendees:  Patient:    Family:    Physician: Aggie PA 05/28/2013 4:40 PM   Nursing:    Clinical Social Worker [employer], LCSWA  05/28/2013 4:40 PM   Other:    Other:    Other:    Other:    Scribe for Treatment Team:  Trula Slade LCSWA 05/28/2013 4:40 PM

## 2013-05-29 MED ORDER — HYDROXYZINE HCL 25 MG PO TABS: 25.0000 mg | ORAL_TABLET | Freq: Every evening | ORAL | Status: DC | PRN

## 2013-05-29 MED ORDER — TRAZODONE HCL 50 MG PO TABS: 50.0000 mg | ORAL_TABLET | Freq: Every evening | ORAL | Status: DC | PRN

## 2013-05-29 MED ORDER — ESCITALOPRAM OXALATE 20 MG PO TABS: 20.0000 mg | ORAL_TABLET | Freq: Every day | ORAL | Status: DC

## 2013-05-29 MED ORDER — LISINOPRIL 5 MG PO TABS: 5.0000 mg | ORAL_TABLET | Freq: Every day | ORAL | Status: DC

## 2013-05-29 MED ORDER — DIPHENOXYLATE-ATROPINE 2.5-0.025 MG PO TABS: 2.0000 | ORAL_TABLET | Freq: Two times a day (BID) | ORAL | Status: DC

## 2013-05-29 MED ORDER — NEBIVOLOL HCL 5 MG PO TABS: 5.0000 mg | ORAL_TABLET | Freq: Every day | ORAL | Status: DC

## 2013-05-29 MED ORDER — PANTOPRAZOLE SODIUM 40 MG PO TBEC: 40.0000 mg | DELAYED_RELEASE_TABLET | Freq: Every day | ORAL | Status: DC

## 2013-05-29 MED ORDER — EZETIMIBE 10 MG PO TABS: 10.0000 mg | ORAL_TABLET | Freq: Every day | ORAL | Status: DC

## 2013-05-29 NOTE — Discharge Summary (Signed)
Physician Discharge Summary Note  Patient:  [name] is an 67 y.o., female MRN:  [id number] DOB:  07/11/1946 Patient phone:  [phone] (home)  Patient address:   [address] [place] [address],   Date of Admission:  05/26/2013 Date of Discharge: 05/29/13  Reason for Admission:  Alcohol intoxication  Discharge Diagnoses: Principal Problem:   Alcohol dependence with physiological dependence Active Problems:   Psychoactive substance-induced organic mood disorder   Alcohol abuse, continuous  Review of Systems  Constitutional: Negative.   HENT: Negative.   Respiratory: Negative.   Gastrointestinal: Negative.   Genitourinary: Negative.   Musculoskeletal: Negative.   Skin: Negative.   Neurological: Negative.   Endo/Heme/Allergies: Negative.   Psychiatric/Behavioral: Negative for depression, suicidal ideas, hallucinations and memory loss. Substance abuse: Alcoholism. The patient is nervous/anxious (Stabilized with medication prior to discharge) and has insomnia (Stabilized with medication prior to discharge).    Axis Diagnosis:   AXIS I:  Alcohol dependence with physiological dependence, Psychoactive substance induced organic mood disorder AXIS II:  Deferred AXIS III:   Past Medical History  Diagnosis Date  . Alcohol abuse   . Diabetes mellitus   . Hypertension   . Fibromyalgia   . Depression   . Carotid artery stenosis    AXIS IV:  economic problems, occupational problems, other psychosocial or environmental problems and Substance dependence AXIS V:  64  Level of Care:  OP  Hospital Course:  . [name] is a 67 y/o female with a past psychiatric history significant for alcohol abuse. The patient has had several relapses over the past year and reported doing well until about one month ago. She reports she started drinking again up to 12 drinks per day, then tried to taper herself but became sick in the process which led her husband to bring her to the ED.  She reports that she was prescribed Adderall to help with ADD but she states it helps with not drinking, though she had relapses with Adderall. The patient's daughter is on methadone and living with the patient, and she her husband is helping with grandchildren.  Upon admission into this hospital, and after admission assessment/evaluation coupled with UDS/Toxicology reports, it was determined that [name] will need detoxification treatment protocol to stabilize her system of alcohiol intoxication and to combat the withdrawal symptoms of this substance as well. [name] was started on Librium treatment protocol. She was also enrolled in group counseling sessions and activities to learn coping skills that should help her after discharge to cope better and manage her substance abuse issues to sustain a much longer sobriety. She also attended AA/NA meetings being offered and held on this unit. She has some previously existing and or identifiable medical conditions that required treatment and or monitoring. She received medication management for all those health issues as well. She was monitored closely for any potential problems that may arise as a result of and or during detoxification treatment. Patient tolerated her treatment regimen and detoxification treatment protocol without any significant adverse effects and or reactions presented.  Patient attended treatment team meeting this am and met with the treatment team members. His reason for admission, present symptoms, substance abuse issues, response to treatment and discharge plans discussed. Patient endorsed that he is doing well and stable for discharge to pursue the next phase of her substance abuse treatment. It was then agreed upon that he will follow-[facility] for medication management with Dr. Evelene Croon on 06/23/13 at  04:30 pm and with Hurley Cisco on 06/14/13 at 1:00 pm for counseling services. The address, date, time and  contact information for this clinic provided for patient in writing.   Besides the treatments received here and scheduled outpatient psychiatric services , patient was encouraged to join/attend AA/NA meetings offered and held within her community. She also received Lexapro 20 mg daily for depression, Hydoxyzine 25 mg at bedtime for anxiety and Trazodone 50 mg Q bedtime for sleep. Upon discharge, patient adamantly denies suicidal, homicidal ideations, auditory, visual hallucinations, delusional thougts and or withdrawal symptoms. Patient left [facility] with all personal belongings in no apparent distress. She received 4 days worth supply samples of her discharge medications provided by [facility] pharmacy. Transportation per family.   Consults:  psychiatry  Significant Diagnostic Studies:  labs: CBC with diff, CMP, UDS, Toxicology tests, U/A  Discharge Vitals:   Blood pressure 130/79, pulse 81, temperature 97.9 F (36.6 C), temperature source Oral, resp. rate 16, height 5' 2.5" (1.588 m), weight 73.483 kg (162 lb). Body mass index is 29.14 kg/(m^2). Lab Results:   Results for orders placed during the hospital encounter of 05/26/13 (from the past 72 hour(s))  GLUCOSE, CAPILLARY     Status: Abnormal   Collection Time    05/27/13  5:04 PM      Result Value Range   Glucose-Capillary 177 (*) 70 - 99 mg/dL  GLUCOSE, CAPILLARY     Status: Abnormal   Collection Time    05/28/13  6:28 AM      Result Value Range   Glucose-Capillary 166 (*) 70 - 99 mg/dL   Comment 1 Notify RN     Comment 2 Documented in Chart    GLUCOSE, CAPILLARY     Status: Abnormal   Collection Time    05/28/13 12:15 PM      Result Value Range   Glucose-Capillary 184 (*) 70 - 99 mg/dL   Comment 1 Documented in Chart     Comment 2 Notify RN      Physical Findings: AIMS: Facial and Oral Movements Muscles of Facial Expression: None, normal Lips and Perioral Area: None, normal Jaw: None, normal Tongue: None, normal,Extremity  Movements Upper (arms, wrists, hands, fingers): None, normal Lower (legs, knees, ankles, toes): None, normal, Trunk Movements Neck, shoulders, hips: None, normal, Overall Severity Severity of abnormal movements (highest score from questions above): None, normal Incapacitation due to abnormal movements: None, normal Patient's awareness of abnormal movements (rate only patient's report): No Awareness, Dental Status Current problems with teeth and/or dentures?: No Does patient usually wear dentures?: No  CIWA:  CIWA-Ar Total: 0 COWS:     Psychiatric Specialty Exam: See Psychiatric Specialty Exam and Suicide Risk Assessment completed by Attending Physician prior to discharge.  Discharge destination:  Home  Is patient on multiple antipsychotic therapies at discharge:  No   Has Patient had three or more failed trials of antipsychotic monotherapy by history:  No  Recommended Plan for Multiple Antipsychotic Therapies: NA     Medication List    STOP taking these medications       amphetamine-dextroamphetamine 30 MG tablet  Commonly known as:  ADDERALL     Fish Oil 1000 MG Cpdr     ondansetron 8 MG disintegrating tablet  Commonly known as:  ZOFRAN-ODT      TAKE these medications     Indication   diphenoxylate-atropine 2.5-0.025 MG per tablet  Commonly known as:  LOMOTIL  Take 2 tablets by mouth 2 (two) times  daily. For loose stools   Indication:  Diarrhea     escitalopram 20 MG tablet  Commonly known as:  LEXAPRO  Take 1 tablet (20 mg total) by mouth daily. For depression   Indication:  Depression, Generalized Anxiety Disorder     ezetimibe 10 MG tablet  Commonly known as:  ZETIA  Take 1 tablet (10 mg total) by mouth daily. For cholesterol control   Indication:  Elevation of Both Cholesterol and Triglycerides in Blood     hydrOXYzine 25 MG tablet  Commonly known as:  ATARAX/VISTARIL  Take 1 tablet (25 mg total) by mouth at bedtime as needed (sleep). For anxiety/sleep    Indication:  Anxiety associated with Organic Disease     lisinopril 5 MG tablet  Commonly known as:  PRINIVIL,ZESTRIL  Take 1 tablet (5 mg total) by mouth daily. For high blood pressure control   Indication:  High Blood Pressure     nebivolol 5 MG tablet  Commonly known as:  BYSTOLIC  Take 1 tablet (5 mg total) by mouth daily. For high blood pressure control   Indication:  High Blood Pressure     pantoprazole 40 MG tablet  Commonly known as:  PROTONIX  Take 1 tablet (40 mg total) by mouth daily. For acid reflux   Indication:  Gastroesophageal Reflux Disease     traZODone 50 MG tablet  Commonly known as:  DESYREL  Take 1 tablet (50 mg total) by mouth at bedtime as needed and may repeat dose one time if needed for sleep.   Indication:  Trouble Sleeping       Follow-up Information   Follow up with [facility] On 06/23/2013. (Appt with Dr. Evelene Croon at 4:30PM for medication managment and hospital followup)    Contact information:   [address] [place], [place] [address] phone: [phone] fax: [phone]      Follow up with [facility] On 06/14/2013. (Appt with Hurley Cisco for therapy at 1:00PM)    Contact information:   [address] [place], [place] [address] phone: [phone] fax: [phone]    Follow-up recommendations: Activity:  As tolerated Diet: As recommended by your primary care doctor. Keep all scheduled follow-up appointments as recommended.    Comments:  Take all your medications as prescribed by your mental healthcare provider. Report any adverse effects and or reactions from your medicines to your outpatient provider promptly. Patient is instructed and cautioned to not engage in alcohol and or illegal drug use while on prescription medicines. In the event of worsening symptoms, patient is instructed to call the crisis hotline, 911 and or go to the nearest ED for appropriate evaluation and treatment of  symptoms. Follow-up with your primary care provider for your other medical issues, concerns and or health care needs.   Total Discharge Time:  Greater than 30 minutes.  SignedSanjuana Kava, PMHNP-BC 05/30/2013, 10:52 AM

## 2013-05-29 NOTE — Progress Notes (Signed)
Patient ID: [name], female   DOB: 04-Dec-1946, 67 y.o.   MRN: [id number] She was discharged home and was picked up by her husband  At 12:10pm. She voiced understanding of discharge teaching and of the follow up plan. Medication and fall risk teaching done. She senies thoughts of SI , plan to go on vacation Thursday.  All belongings taken home with her.

## 2013-05-29 NOTE — Progress Notes (Signed)
[facility] Adult Case Management Discharge Plan :  Will you be returning to the same living situation after discharge: Yes,  home with family At discharge, do you have transportation home?:Yes,  family Do you have the ability to pay for your medications:Yes,  private insurance  Release of information consent forms completed and in the chart;  Patient's signature needed at discharge.  Patient to Follow up at: Follow-up Information   Follow up with [facility] On 06/23/2013. (Appt with Dr. Evelene Croon at 4:30PM for medication managment and hospital followup)    Contact information:   [address] [place], [place] [address] phone: [phone] fax: [phone]      Follow up with [facility] On 06/14/2013. (Appt with Hurley Cisco for therapy at 1:00PM)    Contact information:   [address] [place], [place] [address] phone: [phone] fax: [phone]      Patient denies SI/HI:   Yes in group  Safety Planning and Suicide Prevention discussed:  Yes,  SI not present during admission or during stay at [facility]. SPE not needed for this pt.  Smart, Kha Hari 05/29/2013, 10:24 AM

## 2013-05-29 NOTE — [facility] Group Notes (Signed)
[facility] LCSW Aftercare Discharge Planning Group Note   05/29/2013 10:14 AM  Participation Quality:  DID NOT ATTEND  Smart, Herbert Seta

## 2013-05-29 NOTE — Progress Notes (Signed)
Adult Psychoeducational Group Note  Date:  05/29/2013 Time:  12:09 AM  Group Topic/Focus:  Wrap-Up Group:   The focus of this group is to help patients review their daily goal of treatment and discuss progress on daily workbooks.  Participation Level:  Minimal  Participation Quality:  Resistant  Affect:  Flat  Cognitive:  Alert and Oriented  Insight: Lacking  Engagement in Group:  Lacking  Modes of Intervention:  Exploration and Support  Additional Comments:  Pt stated that she had not met her original goal which was to "leave as soon as she got here". Pt stated that she didn't believe she should have been admitted because she already detoxed at the hospital. Pt also stated that she was going to go on vacation as soon as she left here tomorrow.   Humberto Seals Monique 05/29/2013, 12:09 AM

## 2013-05-29 NOTE — [facility] Suicide Risk Assessment (Signed)
Suicide Risk Assessment  Discharge Assessment     Demographic Factors:  female  Mental Status Per Nursing Assessment::   On Admission:  NA  Current Mental Status by Physician: Patient denies suicidal ideation, intent or plan  Loss Factors: NA  Historical Factors: Family history of mental illness or substance abuse and Impulsivity  Risk Reduction Factors:   Sense of responsibility to family, Living with another person, especially a relative, Positive social support and Positive therapeutic relationship  Continued Clinical Symptoms:  Alcohol/Substance Abuse/Dependencies  Cognitive Features That Contribute To Risk:  Closed-mindedness    Suicide Risk:  Minimal: No identifiable suicidal ideation.  Patients presenting with no risk factors but with morbid ruminations; may be classified as minimal risk based on the severity of the depressive symptoms  Discharge Diagnoses:   AXIS I:  Alcohol dependence. Depressive disorder, NOS AXIS II:  Deferred AXIS III:   Past Medical History  Diagnosis Date  . Alcohol abuse   . Diabetes mellitus   . Hypertension   . Fibromyalgia   . Depression   . Carotid artery stenosis    AXIS IV:  other psychosocial or environmental problems and problems related to social environment AXIS V:  61-70 mild symptoms  Plan Of Care/Follow-up recommendations:  Activity:  as tolerated Diet:  healthy Tests:  routine  Other:  patient to keep her after care appointment  Is patient on multiple antipsychotic therapies at discharge:  No   Has Patient had three or more failed trials of antipsychotic monotherapy by history:  No  Recommended Plan for Multiple Antipsychotic Therapies: N/A  Thedore Mins MD  05/29/2013, 11:16 AM

## 2013-05-30 LAB — GLUCOSE, CAPILLARY: Glucose-Capillary: 198 mg/dL — ABNORMAL HIGH (ref 70–99)

## 2013-05-30 NOTE — Progress Notes (Signed)
Patient Discharge Instructions:  After Visit Summary (AVS):   Faxed to:  05/30/13 Discharge Summary Note:   Faxed to:  05/30/13 Psychiatric Admission Assessment Note:   Faxed to:  05/30/13 Suicide Risk Assessment - Discharge Assessment:   Faxed to:  05/30/13 Faxed/Sent to the Next Level Care provider:  05/30/13 Faxed to [facility] @ [phone]  Jerelene Redden, 05/30/2013, 4:03 PM

## 2013-06-01 NOTE — Discharge Summary (Signed)
Seen and agreed. Jazzlene Huot, MD 

## 2013-06-21 ENCOUNTER — Encounter (HOSPITAL_COMMUNITY): Payer: Self-pay

## 2013-06-21 ENCOUNTER — Emergency Department (HOSPITAL_COMMUNITY)
Admission: EM | Admit: 2013-06-21 | Discharge: 2013-06-23 | Disposition: A | Discharge status: Home / Self Care | Payer: 59 | Attending: Emergency Medicine | Admitting: Emergency Medicine

## 2013-06-21 DIAGNOSIS — Z8739 Personal history of other diseases of the musculoskeletal system and connective tissue: Secondary | ICD-10-CM | POA: Insufficient documentation

## 2013-06-21 DIAGNOSIS — F17200 Nicotine dependence, unspecified, uncomplicated: Secondary | ICD-10-CM | POA: Insufficient documentation

## 2013-06-21 DIAGNOSIS — F329 Major depressive disorder, single episode, unspecified: Secondary | ICD-10-CM | POA: Insufficient documentation

## 2013-06-21 DIAGNOSIS — E119 Type 2 diabetes mellitus without complications: Secondary | ICD-10-CM | POA: Insufficient documentation

## 2013-06-21 DIAGNOSIS — I10 Essential (primary) hypertension: Secondary | ICD-10-CM | POA: Insufficient documentation

## 2013-06-21 DIAGNOSIS — Z79899 Other long term (current) drug therapy: Secondary | ICD-10-CM | POA: Insufficient documentation

## 2013-06-21 DIAGNOSIS — Z8679 Personal history of other diseases of the circulatory system: Secondary | ICD-10-CM | POA: Insufficient documentation

## 2013-06-21 DIAGNOSIS — F3289 Other specified depressive episodes: Secondary | ICD-10-CM | POA: Insufficient documentation

## 2013-06-21 DIAGNOSIS — R259 Unspecified abnormal involuntary movements: Secondary | ICD-10-CM | POA: Insufficient documentation

## 2013-06-21 DIAGNOSIS — F1010 Alcohol abuse, uncomplicated: Secondary | ICD-10-CM | POA: Insufficient documentation

## 2013-06-21 LAB — COMPREHENSIVE METABOLIC PANEL
ALT: 32 U/L (ref 0–35)
AST: 76 U/L — ABNORMAL HIGH (ref 0–37)
Albumin: 3.8 g/dL (ref 3.5–5.2)
Alkaline Phosphatase: 102 U/L (ref 39–117)
BUN: 12 mg/dL (ref 6–23)
CO2: 28 mEq/L (ref 19–32)
Calcium: 8.7 mg/dL (ref 8.4–10.5)
Chloride: 97 mEq/L (ref 96–112)
Creatinine, Ser: 0.62 mg/dL (ref 0.50–1.10)
GFR calc Af Amer: >90 mL/min (ref >90)
GFR calc non Af Amer: >90 mL/min (ref >90)
Glucose, Bld: 163 mg/dL — ABNORMAL HIGH (ref 70–99)
Potassium: 4.3 mEq/L (ref 3.5–5.1)
Sodium: 138 mEq/L (ref 135–145)
Total Bilirubin: 0.3 mg/dL (ref 0.3–1.2)
Total Protein: 7.4 g/dL (ref 6.0–8.3)

## 2013-06-21 LAB — SALICYLATE LEVEL: Salicylate Lvl: <2 mg/dL — ABNORMAL LOW (ref 2.8–20.0)

## 2013-06-21 LAB — CBC
HCT: 37.3 % (ref 36.0–46.0)
Hemoglobin: 13 g/dL (ref 12.0–15.0)
MCH: 32.8 pg (ref 26.0–34.0)
MCHC: 34.9 g/dL (ref 30.0–36.0)
MCV: 94.2 fL (ref 78.0–100.0)
Platelets: 149 10*3/uL — ABNORMAL LOW (ref 150–400)
RBC: 3.96 MIL/uL (ref 3.87–5.11)
RDW: 15.6 % — ABNORMAL HIGH (ref 11.5–15.5)
WBC: 5 10*3/uL (ref 4.0–10.5)

## 2013-06-21 LAB — ETHANOL: Alcohol, Ethyl (B): 400 mg/dL — ABNORMAL HIGH (ref 0–11)

## 2013-06-21 LAB — ACETAMINOPHEN LEVEL: Acetaminophen (Tylenol), Serum: <15 ug/mL (ref 10–30)

## 2013-06-21 MED ORDER — ONDANSETRON HCL 4 MG PO TABS
4.0000 mg | ORAL_TABLET | Freq: Three times a day (TID) | ORAL | Status: DC | PRN
  Administered 2013-06-22 (×2): 4 mg via ORAL
  Filled 2013-06-21 (×2): qty 1

## 2013-06-21 MED ORDER — VITAMIN B-1 100 MG PO TABS
100.0000 mg | ORAL_TABLET | Freq: Every day | ORAL | Status: DC
  Administered 2013-06-22 – 2013-06-23 (×2): 100 mg via ORAL
  Filled 2013-06-21 (×2): qty 1

## 2013-06-21 MED ORDER — LORAZEPAM 1 MG PO TABS
0.0000 mg | ORAL_TABLET | Freq: Four times a day (QID) | ORAL | Status: DC
  Administered 2013-06-22: 1 mg via ORAL
  Administered 2013-06-22 (×2): 2 mg via ORAL
  Administered 2013-06-22: 1 mg via ORAL
  Filled 2013-06-21: qty 2
  Filled 2013-06-21 (×2): qty 1
  Filled 2013-06-21 (×2): qty 2

## 2013-06-21 MED ORDER — LORAZEPAM 2 MG/ML IJ SOLN
1.0000 mg | Freq: Four times a day (QID) | INTRAMUSCULAR | Status: DC | PRN
  Filled 2013-06-21: qty 1

## 2013-06-21 MED ORDER — THIAMINE HCL 100 MG/ML IJ SOLN: 100.0000 mg | Freq: Every day | INTRAMUSCULAR | Status: DC

## 2013-06-21 MED ORDER — LORAZEPAM 1 MG PO TABS
0.0000 mg | ORAL_TABLET | Freq: Two times a day (BID) | ORAL | Status: DC
  Administered 2013-06-22: 2 mg via ORAL
  Filled 2013-06-21: qty 1

## 2013-06-21 MED ORDER — ADULT MULTIVITAMIN W/MINERALS CH
1.0000 | ORAL_TABLET | Freq: Every day | ORAL | Status: DC
  Administered 2013-06-22 – 2013-06-23 (×2): 1 via ORAL
  Filled 2013-06-21 (×2): qty 1

## 2013-06-21 MED ORDER — FOLIC ACID 1 MG PO TABS
1.0000 mg | ORAL_TABLET | Freq: Every day | ORAL | Status: DC
  Administered 2013-06-22 – 2013-06-23 (×2): 1 mg via ORAL
  Filled 2013-06-21 (×2): qty 1

## 2013-06-21 MED ORDER — LORAZEPAM 1 MG PO TABS
1.0000 mg | ORAL_TABLET | Freq: Four times a day (QID) | ORAL | Status: DC | PRN
  Administered 2013-06-23 (×2): 1 mg via ORAL
  Filled 2013-06-21 (×2): qty 1

## 2013-06-21 NOTE — ED Provider Notes (Signed)
History    CSN: [id number] Arrival date & time 06/21/13  2037  First MD Initiated Contact with Patient 06/21/13 2306     Chief Complaint  Patient presents with  . Medical Clearance   (Consider location/radiation/quality/duration/timing/severity/associated sxs/prior Treatment) HPI Pt with multiple presentations for the same. Here requesting detox from alcohol. States she drinks 3-4 small bottles of wine daily. No SI/HI. No N/V.  Past Medical History  Diagnosis Date  . Alcohol abuse   . Diabetes mellitus   . Hypertension   . Fibromyalgia   . Depression   . Carotid artery stenosis    Past Surgical History  Procedure Laterality Date  . Cesarean section    . Tonsillectomy    . Bunionectomy     History reviewed. No pertinent family history. History  Substance Use Topics  . Smoking status: Current Some Day Smoker    Last Attempt to Quit: 03/11/2010  . Smokeless tobacco: Never Used  . Alcohol Use: Yes     Comment: 4-5 bottles of wine per day   OB History   Grav Para Term Preterm Abortions TAB SAB Ect Mult Living                 Review of Systems  Constitutional: Negative for fever and chills.  Respiratory: Negative for shortness of breath.   Cardiovascular: Negative for chest pain.  Gastrointestinal: Negative for nausea, vomiting and abdominal pain.  Skin: Negative for rash.  Neurological: Positive for tremors. Negative for weakness and numbness.  Psychiatric/Behavioral: Negative for suicidal ideas and hallucinations.    Allergies  Other; Latex; Lipitor; Shellfish allergy; Codeine; Pentazocine lactate; and Sulfonamide derivatives  Home Medications   Current Outpatient Rx  Name  Route  Sig  Dispense  Refill  . acamprosate (CAMPRAL) 333 MG tablet   Oral   Take 666 mg by mouth 3 (three) times daily.         Marland Kitchen buPROPion (WELLBUTRIN XL) 150 MG 24 hr tablet   Oral   Take 150 mg by mouth daily.         . DiphenhydrAMINE HCl, Sleep, (ZZZQUIL PO)   Oral  Take by mouth at bedtime. Patient states she takes 3 capfuls at bedtime.         . diphenoxylate-atropine (LOMOTIL) 2.5-0.025 MG per tablet   Oral   Take 2 tablets by mouth 2 (two) times daily. For loose stools   4 tablet   0   . escitalopram (LEXAPRO) 20 MG tablet   Oral   Take 1 tablet (20 mg total) by mouth daily. For depression   30 tablet   0   . ezetimibe (ZETIA) 10 MG tablet   Oral   Take 1 tablet (10 mg total) by mouth daily. For cholesterol control         . lisinopril (PRINIVIL,ZESTRIL) 5 MG tablet   Oral   Take 1 tablet (5 mg total) by mouth daily. For high blood pressure control         . naproxen sodium (ANAPROX) 220 MG tablet   Oral   Take 880 mg by mouth daily as needed (pain).         . nebivolol (BYSTOLIC) 5 MG tablet   Oral   Take 1 tablet (5 mg total) by mouth daily. For high blood pressure control   30 tablet      . ondansetron (ZOFRAN-ODT) 8 MG disintegrating tablet   Oral   Take 8 mg by mouth 2 (two)  times daily.         . QUEtiapine (SEROQUEL) 25 MG tablet   Oral   Take 25 mg by mouth at bedtime.         . hydrOXYzine (ATARAX/VISTARIL) 25 MG tablet   Oral   Take 1 tablet (25 mg total) by mouth at bedtime as needed (sleep). For anxiety/sleep   30 tablet   0    BP 117/60  Pulse 72  Temp(Src) 98.2 F (36.8 C) (Oral)  Resp 16  SpO2 96% Physical Exam  Nursing note and vitals reviewed. Constitutional: She is oriented to person, place, and time. She appears well-developed and well-nourished. No distress.  HENT:  Head: Normocephalic and atraumatic.  Mouth/Throat: Oropharynx is clear and moist.  Eyes: EOM are normal. Pupils are equal, round, and reactive to light.  Neck: Normal range of motion. Neck supple.  Cardiovascular: Normal rate and regular rhythm.   Pulmonary/Chest: Effort normal and breath sounds normal. No respiratory distress. She has no wheezes. She has no rales.  Abdominal: Soft. Bowel sounds are normal. She  exhibits no distension and no mass. There is no tenderness. There is no rebound and no guarding.  Musculoskeletal: Normal range of motion. She exhibits no edema and no tenderness.  Neurological: She is alert and oriented to person, place, and time.  5/5 motor in all ext, sensation intact  Skin: Skin is warm and dry. No rash noted. No erythema.  Psychiatric: She has a normal mood and affect. Her behavior is normal.    ED Course  Procedures (including critical care time) Labs Reviewed  CBC - Abnormal; Notable for the following:    RDW 15.6 (*)    Platelets 149 (*)    All other components within normal limits  COMPREHENSIVE METABOLIC PANEL - Abnormal; Notable for the following:    Glucose, Bld 163 (*)    AST 76 (*)    All other components within normal limits  ETHANOL - Abnormal; Notable for the following:    Alcohol, Ethyl (B) 400 (*)    All other components within normal limits  SALICYLATE LEVEL - Abnormal; Notable for the following:    Salicylate Lvl <2.0 (*)    All other components within normal limits  ACETAMINOPHEN LEVEL  URINE RAPID DRUG SCREEN (HOSP PERFORMED)   No results found. 1. Alcohol abuse     MDM  Placed on CIWA protocol. Will consult ACT team.  Discussed with ACT. Asked to have EtOH repeated in AM.   Loren Racer, MD 06/21/13 [phone]

## 2013-06-21 NOTE — ED Notes (Signed)
Pt requesting detox from ETOH, reports she drinks daily for 40 plus years, pt reports she drink 3 4 packs of wine a day, pt last drank 3 hours ago. Pt does admit to SI with out a plan, pt denies HI. Pt is intoxicated

## 2013-06-21 NOTE — ED Notes (Signed)
Pt placed in [place] and security at bedside to wand PT . Family members in room wand.

## 2013-06-21 NOTE — ED Notes (Signed)
Sitter at bedside.

## 2013-06-22 LAB — ETHANOL
Alcohol, Ethyl (B): 135 mg/dL — ABNORMAL HIGH (ref 0–11)
Alcohol, Ethyl (B): 80 mg/dL — ABNORMAL HIGH (ref 0–11)

## 2013-06-22 MED ORDER — LOPERAMIDE HCL 2 MG PO CAPS
2.0000 mg | ORAL_CAPSULE | Freq: Once | ORAL | Status: AC
  Administered 2013-06-22: 2 mg via ORAL
  Filled 2013-06-22: qty 1

## 2013-06-22 MED ORDER — QUETIAPINE FUMARATE 25 MG PO TABS
25.0000 mg | ORAL_TABLET | Freq: Every day | ORAL | Status: DC
  Administered 2013-06-22: 25 mg via ORAL
  Filled 2013-06-22: qty 1

## 2013-06-22 MED ORDER — LISINOPRIL 10 MG PO TABS
5.0000 mg | ORAL_TABLET | Freq: Every day | ORAL | Status: DC
  Administered 2013-06-22: 19:00:00 via ORAL
  Administered 2013-06-23: 5 mg via ORAL
  Filled 2013-06-22 (×2): qty 1

## 2013-06-22 MED ORDER — EZETIMIBE 10 MG PO TABS
10.0000 mg | ORAL_TABLET | Freq: Every day | ORAL | Status: DC
  Administered 2013-06-22 – 2013-06-23 (×2): 10 mg via ORAL
  Filled 2013-06-22 (×2): qty 1

## 2013-06-22 MED ORDER — ACAMPROSATE CALCIUM 333 MG PO TBEC
666.0000 mg | DELAYED_RELEASE_TABLET | Freq: Three times a day (TID) | ORAL | Status: DC
Start: 2013-06-22 — End: 2013-06-23
  Administered 2013-06-22 – 2013-06-23 (×3): 666 mg via ORAL
  Filled 2013-06-22 (×5): qty 2

## 2013-06-22 MED ORDER — HYDROXYZINE HCL 25 MG PO TABS: 25.0000 mg | ORAL_TABLET | Freq: Every evening | ORAL | Status: DC | PRN

## 2013-06-22 MED ORDER — BUPROPION HCL ER (XL) 150 MG PO TB24
150.0000 mg | ORAL_TABLET | Freq: Every day | ORAL | Status: DC
  Administered 2013-06-22 – 2013-06-23 (×2): 150 mg via ORAL
  Filled 2013-06-22 (×2): qty 1

## 2013-06-22 MED ORDER — ESCITALOPRAM OXALATE 10 MG PO TABS
20.0000 mg | ORAL_TABLET | Freq: Every day | ORAL | Status: DC
  Administered 2013-06-22 – 2013-06-23 (×2): 20 mg via ORAL
  Filled 2013-06-22 (×2): qty 2

## 2013-06-22 MED ORDER — DIPHENOXYLATE-ATROPINE 2.5-0.025 MG PO TABS
2.0000 | ORAL_TABLET | Freq: Two times a day (BID) | ORAL | Status: DC
  Administered 2013-06-22 – 2013-06-23 (×2): 2 via ORAL
  Filled 2013-06-22 (×3): qty 2

## 2013-06-22 MED ORDER — ONDANSETRON 4 MG PO TBDP
8.0000 mg | ORAL_TABLET | Freq: Two times a day (BID) | ORAL | Status: DC
  Administered 2013-06-22 – 2013-06-23 (×2): 8 mg via ORAL
  Filled 2013-06-22 (×2): qty 2

## 2013-06-22 MED ORDER — LORAZEPAM 2 MG/ML IJ SOLN
2.0000 mg | Freq: Once | INTRAMUSCULAR | Status: AC
  Administered 2013-06-22: 2 mg via INTRAMUSCULAR

## 2013-06-22 MED ORDER — NEBIVOLOL HCL 5 MG PO TABS
5.0000 mg | ORAL_TABLET | Freq: Every day | ORAL | Status: DC
  Administered 2013-06-22 – 2013-06-23 (×2): 5 mg via ORAL
  Filled 2013-06-22 (×2): qty 1

## 2013-06-22 NOTE — ED Notes (Signed)
Pt trembling has improved but not resolved.  Pt asked for and was given imodium for new symptom of diarrhea.  Pt alert oriented X4

## 2013-06-22 NOTE — ED Notes (Signed)
ACT team in room with pt 

## 2013-06-22 NOTE — ED Notes (Signed)
Sitter discontinued due to flight risk and sitter order discontinued by Dr. Ranae Palms. Pt sleeping with door open. Pt stable. No signs of distress noted.

## 2013-06-22 NOTE — ED Notes (Signed)
Pt resting on bed stated that she was thinking about refusing detox, but wanted to talk with someone first.  ACT team made aware

## 2013-06-22 NOTE — ED Notes (Signed)
Dr Patria Mane in room counseling pt

## 2013-06-22 NOTE — ED Notes (Signed)
Dr. Patria Mane gave an order to give another 2mg  po Ativan.

## 2013-06-22 NOTE — ED Notes (Signed)
Breakfast tray delivered

## 2013-06-22 NOTE — ED Notes (Signed)
Pt mother called and wanted to verify that MD had her correct phone number.  Pt Mother [phone]

## 2013-06-22 NOTE — [facility] Counselor (Addendum)
Pt is very sleepy and upon waking she is observed having severe shaking. Pt denies SI and said "I only have those thoughts when I'm drinking". Writer asked if she was interested in detox, and pt advised Clinical research associate that "I don't want detox". Pt is receiving CIWA protocol and Clinical research associate discussed pt with EDP Dr. [name], whether the pt can be discharged with referrals once she is no longer exhibiting withdrawal symptoms. Dr. [name] is agreeable with this recommendation, and the pt is agreeable as well. Denice Bors, AADC 06/22/2013 1:15 PM

## 2013-06-22 NOTE — ED Notes (Signed)
Pt. Actively vomiting, medicated per orders

## 2013-06-22 NOTE — ED Notes (Addendum)
Pt walks to desk stating she is having withdrawal symptoms. Pt states she is going through everything. Pt requesting Ativan.

## 2013-06-22 NOTE — ED Notes (Signed)
Breakfast tray ordered 

## 2013-06-22 NOTE — ED Notes (Signed)
Pt. Continues to have bilateral hand  Tremors and diarrhea.    Will monitor.  Pt. Reports that nausea has resolved.

## 2013-06-22 NOTE — ED Notes (Signed)
Pt requested ativan for shaking and sweats. Pt has N/V and stomach pain

## 2013-06-22 NOTE — ED Notes (Signed)
Pt sat on side of bed eating breakfast.  Pt much calmer after 6am ativan dose and is resting.

## 2013-06-23 NOTE — BH Assessment (Signed)
Assessment Note   [name] is an 67 y.o. female who presented with BAL of 400 seeking detox from alcohol.  She reports drinking 12-16 small bottles of wine daily for the last 2 weeks after being discharged from [facility] for detox in May.  After sobering up, she requested to be discharged, but was having significant withdrawal symptoms so EDP Campos encouraged the patient to stay for detox.  The patient was given Ativan and agreed she needed help.  When this writer assessed the patient, she was feeling much better and again requested to leave.  She denies SI, but admits to some suicidal ideation while intoxicated, however never with a plan-she does have history of one attempt by overdose 40 years ago, and HI, now or in the last six months, as well as AVH.  She reports her primary stressors are her divorce and housing.  She is now living with her daughter, who is trying to detox from Methadone, and two granddaughters 8 and 10.  They have recently put a pool in their home and it is not secure and she is very worried that a neighbor will fall in and she will be responsible.  She perseverates on this idea and states she cannot stay for detox, despite visible withdrawal symptoms.  She does endorse real intentions of returning to AA and getting sober, but does not want to return to a detox facility.  Consulted with EDP Campos who again advised the patient that if she does not resume drinking that she could be putting her life in danger and the patient agreed that she understood this could be the case though stated she did not think it was likely.  She called her husband to pick her up, although they are divorcing, he is a support to her and drives her to AA, but he was not available.  She decided to stay until morning and understands that we will be happy to make the referral to detox if she changes her mind and that it is in her best interest to do so.    Axis I: Depressive Disorder NOS, Substance Induced Mood  Disorder and Alcohol Dependence Axis II: Deferred Axis III:  Past Medical History  Diagnosis Date  . Alcohol abuse   . Diabetes mellitus   . Hypertension   . Fibromyalgia   . Depression   . Carotid artery stenosis    Axis IV: housing problems and problems with primary support group Axis V: 41-50 serious symptoms  Past Medical History:  Past Medical History  Diagnosis Date  . Alcohol abuse   . Diabetes mellitus   . Hypertension   . Fibromyalgia   . Depression   . Carotid artery stenosis     Past Surgical History  Procedure Laterality Date  . Cesarean section    . Tonsillectomy    . Bunionectomy      Family History: History reviewed. No pertinent family history.  Social History:  reports that she has been smoking.  She has never used smokeless tobacco. She reports that  drinks alcohol. She reports that she does not use illicit drugs.  Additional Social History:     CIWA: CIWA-Ar BP: 105/70 mmHg Pulse Rate: 68 Nausea and Vomiting: no nausea and no vomiting Tactile Disturbances: none Tremor: five Auditory Disturbances: not present Paroxysmal Sweats: two Visual Disturbances: not present Anxiety: mildly anxious Headache, Fullness in Head: none present Agitation: somewhat more than normal activity Orientation and Clouding of Sensorium: oriented and can do  serial additions CIWA-Ar Total: 9 COWS:    Allergies:  Allergies  Allergen Reactions  . Other Anaphylaxis    shellfish  . Latex Other (See Comments)    Break outs  . Lipitor (Atorvastatin Calcium) Other (See Comments)    Muscle aches  . Shellfish Allergy   . Codeine Rash    And nausea   . Pentazocine Lactate Rash  . Sulfonamide Derivatives Rash    Home Medications:  (Not in a hospital admission)  OB/GYN Status:  No LMP recorded. Patient is postmenopausal.  General Assessment Data Location of Assessment: [facility] ED Living Arrangements:  (Daughter and granddaughters) Can pt return to current living  arrangement?: Yes Admission Status: Voluntary Is patient capable of signing voluntary admission?: Yes Transfer from: Acute Hospital Referral Source: Self/Family/Friend  Education Status Is patient currently in school?: No  Risk to self Suicidal Ideation: No-Not Currently/Within Last 6 Months (when intoxicated) Suicidal Intent: No Is patient at risk for suicide?: No Suicidal Plan?: No Access to Means: No What has been your use of drugs/alcohol within the last 12 months?: ongoing alcohol use Previous Attempts/Gestures: Yes How many times?: 1 (overdose 40 years ago) Other Self Harm Risks: none Triggers for Past Attempts: None known Intentional Self Injurious Behavior: None Family Suicide History: Yes Recent stressful life event(s): Divorce;Other (Comment) (unsecured pool at home) Persecutory voices/beliefs?: No Depression: Yes Depression Symptoms: Despondent;Insomnia;Loss of interest in usual pleasures;Feeling worthless/self pity;Feeling angry/irritable;Fatigue;Isolating;Guilt Substance abuse history and/or treatment for substance abuse?: Yes Suicide prevention information given to non-admitted patients: Yes  Risk to Others Homicidal Ideation: No Thoughts of Harm to Others: No Current Homicidal Intent: No Current Homicidal Plan: No Access to Homicidal Means: No History of harm to others?: No Assessment of Violence: None Noted Does patient have access to weapons?: No Criminal Charges Pending?: No Does patient have a court date: No  Psychosis Hallucinations: None noted Delusions: None noted  Mental Status Report Appear/Hygiene: Disheveled Eye Contact: Good Motor Activity: Freedom of movement Speech: Logical/coherent Level of Consciousness: Alert Mood: Anxious Affect: Depressed Anxiety Level: Moderate Thought Processes: Coherent;Relevant Judgement: Unimpaired Orientation: Person;Place;Time;Situation Obsessive Compulsive Thoughts/Behaviors: None  Cognitive  Functioning Concentration: Decreased Memory: Recent Intact;Remote Intact IQ: Average Insight: Poor Impulse Control: Poor Appetite: Fair Weight Loss: 0 Weight Gain: 0 Sleep: Decreased Total Hours of Sleep: 6 Vegetative Symptoms: Staying in bed  ADLScreening [facility] Assessment Services) Patient's cognitive ability adequate to safely complete daily activities?: Yes Patient able to express need for assistance with ADLs?: Yes Independently performs ADLs?: Yes (appropriate for developmental age)  Abuse/Neglect [facility]) Physical Abuse: Denies Verbal Abuse: Denies Sexual Abuse: Denies  Prior Inpatient Therapy Prior Inpatient Therapy: Yes Prior Therapy Dates:  (May) Prior Therapy Facilty/Provider(s): [facility] Reason for Treatment: detox  Prior Outpatient Therapy Prior Outpatient Therapy: Yes Prior Therapy Dates: ongoing Prior Therapy Facilty/Provider(s): AA, Dr Evelene Croon, [facility] Reason for Treatment: substance abuse and depression   ADL Screening (condition at time of admission) Patient's cognitive ability adequate to safely complete daily activities?: Yes Patient able to express need for assistance with ADLs?: Yes Independently performs ADLs?: Yes (appropriate for developmental age)       Abuse/Neglect Assessment (Assessment to be complete while patient is alone) Physical Abuse: Denies Verbal Abuse: Denies Sexual Abuse: Denies Values / Beliefs Cultural Requests During Hospitalization: None Spiritual Requests During Hospitalization: None        Additional Information 1:1 In Past 12 Months?: No CIRT Risk: No Elopement Risk: No Does patient have medical clearance?: Yes  Disposition:  Disposition Initial Assessment Completed for this Encounter: Yes Disposition of Patient: Treatment offered and refused Type of outpatient treatment: Adult Type of treatment offered and refused: In-patient  On Site Evaluation by:  Patria Mane Reviewed with Physician:  Nelda Marseille  Marlana Latus 06/23/2013 1:59 AM

## 2013-06-23 NOTE — ED Provider Notes (Signed)
Patient is refusing inpatient detox. She is requesting to go home. She denies suicidal or homicidal ideations. She was discharged home with outpatient referral guide.  Rolan Bucco, MD 06/23/13 [phone]

## 2013-09-10 ENCOUNTER — Encounter (HOSPITAL_COMMUNITY): Payer: Self-pay | Admitting: Family Medicine

## 2013-09-10 ENCOUNTER — Emergency Department (HOSPITAL_COMMUNITY)
Admission: EM | Admit: 2013-09-10 | Discharge: 2013-09-11 | Disposition: A | Discharge status: Other Acute Inpatient Hospital | Payer: 59 | Attending: Emergency Medicine | Admitting: Emergency Medicine

## 2013-09-10 DIAGNOSIS — Z8679 Personal history of other diseases of the circulatory system: Secondary | ICD-10-CM | POA: Insufficient documentation

## 2013-09-10 DIAGNOSIS — F10229 Alcohol dependence with intoxication, unspecified: Secondary | ICD-10-CM | POA: Insufficient documentation

## 2013-09-10 DIAGNOSIS — Z91040 Latex allergy status: Secondary | ICD-10-CM | POA: Insufficient documentation

## 2013-09-10 DIAGNOSIS — F3289 Other specified depressive episodes: Secondary | ICD-10-CM | POA: Insufficient documentation

## 2013-09-10 DIAGNOSIS — F1020 Alcohol dependence, uncomplicated: Secondary | ICD-10-CM

## 2013-09-10 DIAGNOSIS — Z87891 Personal history of nicotine dependence: Secondary | ICD-10-CM | POA: Insufficient documentation

## 2013-09-10 DIAGNOSIS — Z8739 Personal history of other diseases of the musculoskeletal system and connective tissue: Secondary | ICD-10-CM | POA: Insufficient documentation

## 2013-09-10 DIAGNOSIS — I10 Essential (primary) hypertension: Secondary | ICD-10-CM | POA: Insufficient documentation

## 2013-09-10 DIAGNOSIS — F10929 Alcohol use, unspecified with intoxication, unspecified: Secondary | ICD-10-CM

## 2013-09-10 DIAGNOSIS — F329 Major depressive disorder, single episode, unspecified: Secondary | ICD-10-CM | POA: Insufficient documentation

## 2013-09-10 DIAGNOSIS — E119 Type 2 diabetes mellitus without complications: Secondary | ICD-10-CM | POA: Insufficient documentation

## 2013-09-10 DIAGNOSIS — R259 Unspecified abnormal involuntary movements: Secondary | ICD-10-CM | POA: Insufficient documentation

## 2013-09-10 NOTE — ED Notes (Addendum)
Patient states that her husband brought her to the ER. States that she needs detox from alcohol. States she has had 4 small bottles of Chardonnay tonight. States she has been "drinking heavily this week, I relapsed." States she is not maintaining good sobriety, averaging  3-6 months.

## 2013-09-11 ENCOUNTER — Encounter (HOSPITAL_COMMUNITY): Payer: Self-pay

## 2013-09-11 ENCOUNTER — Encounter (HOSPITAL_COMMUNITY): Payer: Self-pay | Admitting: Registered Nurse

## 2013-09-11 ENCOUNTER — Inpatient Hospital Stay (HOSPITAL_COMMUNITY)
Admission: AD | Admit: 2013-09-11 | Discharge: 2013-09-15 | DRG: 897 | Disposition: A | Discharge status: Home / Self Care | Payer: 59 | Source: Intra-hospital | Attending: Psychiatry | Admitting: Psychiatry

## 2013-09-11 DIAGNOSIS — F329 Major depressive disorder, single episode, unspecified: Secondary | ICD-10-CM | POA: Diagnosis present

## 2013-09-11 DIAGNOSIS — E119 Type 2 diabetes mellitus without complications: Secondary | ICD-10-CM | POA: Diagnosis present

## 2013-09-11 DIAGNOSIS — F3289 Other specified depressive episodes: Secondary | ICD-10-CM

## 2013-09-11 DIAGNOSIS — F1020 Alcohol dependence, uncomplicated: Secondary | ICD-10-CM

## 2013-09-11 DIAGNOSIS — F1994 Other psychoactive substance use, unspecified with psychoactive substance-induced mood disorder: Secondary | ICD-10-CM

## 2013-09-11 DIAGNOSIS — IMO0001 Reserved for inherently not codable concepts without codable children: Secondary | ICD-10-CM | POA: Diagnosis present

## 2013-09-11 DIAGNOSIS — Z79899 Other long term (current) drug therapy: Secondary | ICD-10-CM

## 2013-09-11 DIAGNOSIS — F1010 Alcohol abuse, uncomplicated: Secondary | ICD-10-CM | POA: Diagnosis present

## 2013-09-11 DIAGNOSIS — I10 Essential (primary) hypertension: Secondary | ICD-10-CM | POA: Diagnosis present

## 2013-09-11 LAB — COMPREHENSIVE METABOLIC PANEL
ALT: 16 U/L (ref 0–35)
AST: 22 U/L (ref 0–37)
Albumin: 3.8 g/dL (ref 3.5–5.2)
Alkaline Phosphatase: 110 U/L (ref 39–117)
BUN: 13 mg/dL (ref 6–23)
CO2: 23 mEq/L (ref 19–32)
Calcium: 8.5 mg/dL (ref 8.4–10.5)
Chloride: 92 mEq/L — ABNORMAL LOW (ref 96–112)
Creatinine, Ser: 0.59 mg/dL (ref 0.50–1.10)
GFR calc Af Amer: >90 mL/min (ref >90)
GFR calc non Af Amer: >90 mL/min (ref >90)
Glucose, Bld: 186 mg/dL — ABNORMAL HIGH (ref 70–99)
Potassium: 4.3 mEq/L (ref 3.5–5.1)
Sodium: 131 mEq/L — ABNORMAL LOW (ref 135–145)
Total Bilirubin: 0.3 mg/dL (ref 0.3–1.2)
Total Protein: 7.5 g/dL (ref 6.0–8.3)

## 2013-09-11 LAB — CBC
HCT: 39.6 % (ref 36.0–46.0)
Hemoglobin: 14 g/dL (ref 12.0–15.0)
MCH: 32.7 pg (ref 26.0–34.0)
MCHC: 35.4 g/dL (ref 30.0–36.0)
MCV: 92.5 fL (ref 78.0–100.0)
Platelets: 223 10*3/uL (ref 150–400)
RBC: 4.28 MIL/uL (ref 3.87–5.11)
RDW: 14.5 % (ref 11.5–15.5)
WBC: 7.4 10*3/uL (ref 4.0–10.5)

## 2013-09-11 LAB — RAPID URINE DRUG SCREEN, HOSP PERFORMED
Amphetamines: NOT DETECTED
Barbiturates: NOT DETECTED
Benzodiazepines: NOT DETECTED
Cocaine: NOT DETECTED
Opiates: NOT DETECTED
Tetrahydrocannabinol: NOT DETECTED

## 2013-09-11 LAB — ACETAMINOPHEN LEVEL: Acetaminophen (Tylenol), Serum: <15 ug/mL (ref 10–30)

## 2013-09-11 LAB — SALICYLATE LEVEL: Salicylate Lvl: <2 mg/dL — ABNORMAL LOW (ref 2.8–20.0)

## 2013-09-11 LAB — ETHANOL: Alcohol, Ethyl (B): 288 mg/dL — ABNORMAL HIGH (ref 0–11)

## 2013-09-11 MED ORDER — EZETIMIBE 10 MG PO TABS
10.0000 mg | ORAL_TABLET | Freq: Every day | ORAL | Status: DC
  Administered 2013-09-12 – 2013-09-15 (×4): 10 mg via ORAL
  Filled 2013-09-11 (×6): qty 1

## 2013-09-11 MED ORDER — ADULT MULTIVITAMIN W/MINERALS CH
1.0000 | ORAL_TABLET | Freq: Every day | ORAL | Status: DC
  Filled 2013-09-11: qty 1

## 2013-09-11 MED ORDER — ONDANSETRON HCL 4 MG PO TABS
4.0000 mg | ORAL_TABLET | Freq: Three times a day (TID) | ORAL | Status: DC | PRN
  Administered 2013-09-11: 4 mg via ORAL
  Filled 2013-09-11: qty 1

## 2013-09-11 MED ORDER — FOLIC ACID 1 MG PO TABS
1.0000 mg | ORAL_TABLET | Freq: Every day | ORAL | Status: DC
  Administered 2013-09-11: 1 mg via ORAL
  Filled 2013-09-11: qty 1

## 2013-09-11 MED ORDER — MAGNESIUM HYDROXIDE 400 MG/5ML PO SUSP: 30.0000 mL | Freq: Every day | ORAL | Status: DC | PRN

## 2013-09-11 MED ORDER — ZOLPIDEM TARTRATE 5 MG PO TABS: 5.0000 mg | ORAL_TABLET | Freq: Every evening | ORAL | Status: DC | PRN

## 2013-09-11 MED ORDER — OMEGA-3 FATTY ACIDS 1000 MG PO CAPS: 4.0000 g | ORAL_CAPSULE | Freq: Every day | ORAL | Status: DC

## 2013-09-11 MED ORDER — OMEGA-3-ACID ETHYL ESTERS 1 G PO CAPS
2.0000 g | ORAL_CAPSULE | Freq: Two times a day (BID) | ORAL | Status: DC
  Administered 2013-09-12 – 2013-09-15 (×7): 2 g via ORAL
  Filled 2013-09-11 (×11): qty 2

## 2013-09-11 MED ORDER — ALUM & MAG HYDROXIDE-SIMETH 200-200-20 MG/5ML PO SUSP: 30.0000 mL | ORAL | Status: DC | PRN

## 2013-09-11 MED ORDER — LORAZEPAM 2 MG/ML IJ SOLN: 1.0000 mg | Freq: Four times a day (QID) | INTRAMUSCULAR | Status: DC | PRN

## 2013-09-11 MED ORDER — NICOTINE 21 MG/24HR TD PT24
21.0000 mg | MEDICATED_PATCH | Freq: Every day | TRANSDERMAL | Status: DC
  Filled 2013-09-11 (×6): qty 1

## 2013-09-11 MED ORDER — ACETAMINOPHEN 325 MG PO TABS: 650.0000 mg | ORAL_TABLET | Freq: Four times a day (QID) | ORAL | Status: DC | PRN

## 2013-09-11 MED ORDER — LORAZEPAM 1 MG PO TABS
1.0000 mg | ORAL_TABLET | Freq: Four times a day (QID) | ORAL | Status: DC | PRN
  Administered 2013-09-11 (×3): 1 mg via ORAL
  Filled 2013-09-11 (×3): qty 1

## 2013-09-11 MED ORDER — VITAMIN B-1 100 MG PO TABS
100.0000 mg | ORAL_TABLET | Freq: Every day | ORAL | Status: DC
  Administered 2013-09-12 – 2013-09-15 (×4): 100 mg via ORAL
  Filled 2013-09-11 (×6): qty 1

## 2013-09-11 MED ORDER — NICOTINE 21 MG/24HR TD PT24: 21.0000 mg | MEDICATED_PATCH | Freq: Every day | TRANSDERMAL | Status: DC | PRN

## 2013-09-11 MED ORDER — INFLUENZA VAC SPLIT QUAD 0.5 ML IM SUSP
0.5000 mL | INTRAMUSCULAR | Status: AC
  Administered 2013-09-12: 0.5 mL via INTRAMUSCULAR
  Filled 2013-09-11: qty 0.5

## 2013-09-11 MED ORDER — FOLIC ACID 1 MG PO TABS
1.0000 mg | ORAL_TABLET | Freq: Every day | ORAL | Status: DC
  Filled 2013-09-11: qty 1

## 2013-09-11 MED ORDER — ACETAMINOPHEN 325 MG PO TABS: 650.0000 mg | ORAL_TABLET | ORAL | Status: DC | PRN

## 2013-09-11 MED ORDER — LISINOPRIL 5 MG PO TABS
5.0000 mg | ORAL_TABLET | Freq: Every day | ORAL | Status: DC
  Administered 2013-09-11 – 2013-09-15 (×4): 5 mg via ORAL
  Filled 2013-09-11 (×8): qty 1

## 2013-09-11 MED ORDER — ADULT MULTIVITAMIN W/MINERALS CH
1.0000 | ORAL_TABLET | Freq: Every day | ORAL | Status: DC
  Administered 2013-09-12 – 2013-09-15 (×4): 1 via ORAL
  Filled 2013-09-11 (×6): qty 1

## 2013-09-11 MED ORDER — IBUPROFEN 200 MG PO TABS: 600.0000 mg | ORAL_TABLET | Freq: Three times a day (TID) | ORAL | Status: DC | PRN

## 2013-09-11 MED ORDER — THIAMINE HCL 100 MG/ML IJ SOLN: 100.0000 mg | Freq: Every day | INTRAMUSCULAR | Status: DC

## 2013-09-11 MED ORDER — TRAZODONE HCL 50 MG PO TABS
50.0000 mg | ORAL_TABLET | Freq: Every evening | ORAL | Status: DC | PRN
  Administered 2013-09-11 – 2013-09-14 (×4): 50 mg via ORAL
  Filled 2013-09-11: qty 1
  Filled 2013-09-11: qty 8
  Filled 2013-09-11 (×3): qty 1

## 2013-09-11 MED ORDER — PNEUMOCOCCAL VAC POLYVALENT 25 MCG/0.5ML IJ INJ: 0.5000 mL | INJECTION | INTRAMUSCULAR | Status: AC

## 2013-09-11 MED ORDER — LORAZEPAM 2 MG/ML IJ SOLN: 1.0000 mg | Freq: Four times a day (QID) | INTRAMUSCULAR | Status: AC | PRN

## 2013-09-11 MED ORDER — LISINOPRIL 5 MG PO TABS: 5.0000 mg | ORAL_TABLET | Freq: Every day | ORAL | Status: DC

## 2013-09-11 MED ORDER — LORAZEPAM 1 MG PO TABS
1.0000 mg | ORAL_TABLET | Freq: Four times a day (QID) | ORAL | Status: DC | PRN
  Administered 2013-09-12: 1 mg via ORAL

## 2013-09-11 MED ORDER — ONDANSETRON 4 MG PO TBDP
4.0000 mg | ORAL_TABLET | Freq: Once | ORAL | Status: AC
  Administered 2013-09-11: 4 mg via ORAL
  Filled 2013-09-11: qty 1

## 2013-09-11 MED ORDER — IBUPROFEN 600 MG PO TABS: 600.0000 mg | ORAL_TABLET | Freq: Three times a day (TID) | ORAL | Status: DC | PRN

## 2013-09-11 MED ORDER — IBUPROFEN 600 MG PO TABS
600.0000 mg | ORAL_TABLET | Freq: Three times a day (TID) | ORAL | Status: DC | PRN
  Administered 2013-09-13: 600 mg via ORAL
  Filled 2013-09-11: qty 1

## 2013-09-11 MED ORDER — NEBIVOLOL HCL 5 MG PO TABS
5.0000 mg | ORAL_TABLET | Freq: Every day | ORAL | Status: DC
Start: 2013-09-12 — End: 2013-09-11
  Filled 2013-09-11: qty 1

## 2013-09-11 MED ORDER — LORAZEPAM 1 MG PO TABS
1.0000 mg | ORAL_TABLET | Freq: Four times a day (QID) | ORAL | Status: AC | PRN
  Administered 2013-09-11 – 2013-09-13 (×2): 1 mg via ORAL
  Filled 2013-09-11 (×3): qty 1

## 2013-09-11 MED ORDER — FOLIC ACID 1 MG PO TABS
1.0000 mg | ORAL_TABLET | Freq: Every day | ORAL | Status: DC
  Administered 2013-09-12 – 2013-09-15 (×4): 1 mg via ORAL
  Filled 2013-09-11 (×6): qty 1

## 2013-09-11 MED ORDER — ADULT MULTIVITAMIN W/MINERALS CH
1.0000 | ORAL_TABLET | Freq: Every day | ORAL | Status: DC
  Administered 2013-09-11: 1 via ORAL
  Filled 2013-09-11: qty 1

## 2013-09-11 MED ORDER — VITAMIN B-1 100 MG PO TABS
100.0000 mg | ORAL_TABLET | Freq: Every day | ORAL | Status: DC
  Filled 2013-09-11: qty 1

## 2013-09-11 MED ORDER — NEBIVOLOL HCL 5 MG PO TABS
5.0000 mg | ORAL_TABLET | Freq: Every day | ORAL | Status: DC
  Administered 2013-09-11 – 2013-09-15 (×3): 5 mg via ORAL
  Filled 2013-09-11 (×8): qty 1

## 2013-09-11 MED ORDER — ONDANSETRON HCL 4 MG PO TABS
4.0000 mg | ORAL_TABLET | Freq: Three times a day (TID) | ORAL | Status: DC | PRN
  Administered 2013-09-12: 4 mg via ORAL
  Filled 2013-09-11: qty 1

## 2013-09-11 MED ORDER — VITAMIN B-1 100 MG PO TABS
100.0000 mg | ORAL_TABLET | Freq: Every day | ORAL | Status: DC
  Administered 2013-09-11: 100 mg via ORAL
  Filled 2013-09-11: qty 1

## 2013-09-11 NOTE — ED Notes (Signed)
Report given to [place] RN at [employer] by security.

## 2013-09-11 NOTE — Consult Note (Addendum)
[facility] Face-to-Face Psychiatry Consult   Reason for Consult:  Evaluation for inpatient treatment alcohol detox Referring Physician:  EDP  MELYNDA KRZYWICKI is an 67 y.o. female.  Assessment: AXIS I:  Alcohol Abuse, Anxiety Disorder NOS and Depressive Disorder NOS AXIS II:  Deferred AXIS III:   Past Medical History  Diagnosis Date  . Alcohol abuse   . Diabetes mellitus   . Hypertension   . Fibromyalgia   . Depression   . Carotid artery stenosis    AXIS IV:  other psychosocial or environmental problems and problems related to social environment AXIS V:  51-60 moderate symptoms  Plan:  No evidence of imminent risk to self or others at present.   Alcohol detox  Subjective:   KIP KAUTZMAN is a 67 y.o. female.  HPI:  Patient presents [facility] requesting assistance with alcohol detox.  Patient states that she has had multiple detox.  Patient states that she was recently discharged from [facility] in March 2014 detox rehab program.  Patient states that it doesn't last long that she is usually drinking again the next day or few days after.  Patient denies suicidal ideation, homicidal ideation, psychosis, or paranoia.   HPI Elements:   Location:  [facility] ED. Quality:  Affecting patient mentally and physically. Severity:  Alcohol abuse.  Past Psychiatric History: Past Medical History  Diagnosis Date  . Alcohol abuse   . Diabetes mellitus   . Hypertension   . Fibromyalgia   . Depression   . Carotid artery stenosis     reports that she quit smoking about 3 years ago. She has never used smokeless tobacco. She reports that  drinks alcohol. She reports that she does not use illicit drugs. No family history on file.         Allergies:   Allergies  Allergen Reactions  . Other Anaphylaxis    shellfish  . Latex Other (See Comments)    Break outs  . Lipitor [Atorvastatin Calcium] Other (See Comments)    Muscle aches  . Shellfish Allergy   . Codeine Rash    And  nausea   . Pentazocine Lactate Rash  . Sulfonamide Derivatives Rash    ACT Assessment Complete:  No:   Past Psychiatric History: Diagnosis:  Alcohol abuse  Hospitalizations:  Multiple detox and rehab history  Outpatient Care:    Substance Abuse Care:    Self-Mutilation:    Suicidal Attempts:    Homicidal Behaviors:     Violent Behaviors:     Place of Residence:   Marital Status:   Employed/Unemployed:   Education:   Family Supports:   Objective: Blood pressure 177/92, pulse 99, temperature 98.6 F (37 C), temperature source Oral, resp. rate 16, SpO2 95.00%.There is no weight on file to calculate BMI. Results for orders placed during the hospital encounter of 09/10/13 (from the past 72 hour(s))  ACETAMINOPHEN LEVEL     Status: None   Collection Time    09/11/13  1:50 AM      Result Value Range   Acetaminophen (Tylenol), Serum <15.0  10 - 30 ug/mL  CBC     Status: None   Collection Time    09/11/13  1:50 AM      Result Value Range   WBC 7.4  4.0 - 10.5 K/uL   RBC 4.28  3.87 - 5.11 MIL/uL   Hemoglobin 14.0  12.0 - 15.0 g/dL   HCT 16.1  09.6 - 04.5 %  MCV 92.5  78.0 - 100.0 fL   MCH 32.7  26.0 - 34.0 pg   MCHC 35.4  30.0 - 36.0 g/dL   RDW 47.8  29.5 - 62.1 %   Platelets 223  150 - 400 K/uL  COMPREHENSIVE METABOLIC PANEL     Status: Abnormal   Collection Time    09/11/13  1:50 AM      Result Value Range   Sodium 131 (*) 135 - 145 mEq/L   Potassium 4.3  3.5 - 5.1 mEq/L   Chloride 92 (*) 96 - 112 mEq/L   CO2 23  19 - 32 mEq/L   Glucose, Bld 186 (*) 70 - 99 mg/dL   BUN 13  6 - 23 mg/dL   Creatinine, Ser 3.08  0.50 - 1.10 mg/dL   Calcium 8.5  8.4 - 65.7 mg/dL   Total Protein 7.5  6.0 - 8.3 g/dL   Albumin 3.8  3.5 - 5.2 g/dL   AST 22  0 - 37 U/L   ALT 16  0 - 35 U/L   Alkaline Phosphatase 110  39 - 117 U/L   Total Bilirubin 0.3  0.3 - 1.2 mg/dL   GFR calc non Af Amer >90  >90 mL/min   GFR calc Af Amer >90  >90 mL/min   Comment: (NOTE)     The eGFR has been  calculated using the CKD EPI equation.     This calculation has not been validated in all clinical situations.     eGFR's persistently <90 mL/min signify possible Chronic Kidney     Disease.  ETHANOL     Status: Abnormal   Collection Time    09/11/13  1:50 AM      Result Value Range   Alcohol, Ethyl (B) 288 (*) 0 - 11 mg/dL  SALICYLATE LEVEL     Status: Abnormal   Collection Time    09/11/13  1:50 AM      Result Value Range   Salicylate Lvl <2.0 (*) 2.8 - 20.0 mg/dL  URINE RAPID DRUG SCREEN (HOSP PERFORMED)     Status: None   Collection Time    09/11/13  3:40 AM      Result Value Range   Opiates NONE DETECTED  NONE DETECTED   Cocaine NONE DETECTED  NONE DETECTED   Benzodiazepines NONE DETECTED  NONE DETECTED   Amphetamines NONE DETECTED  NONE DETECTED   Tetrahydrocannabinol NONE DETECTED  NONE DETECTED   Barbiturates NONE DETECTED  NONE DETECTED   Comment:            DRUG SCREEN FOR MEDICAL PURPOSES     ONLY.  IF CONFIRMATION IS NEEDED     FOR ANY PURPOSE, NOTIFY LAB     WITHIN 5 DAYS.                LOWEST DETECTABLE LIMITS     FOR URINE DRUG SCREEN     Drug Class       Cutoff (ng/mL)     Amphetamine      1000     Barbiturate      200     Benzodiazepine   200     Tricyclics       300     Opiates          300     Cocaine          300     THC  50     Current Facility-Administered Medications  Medication Dose Route Frequency Provider Last Rate Last Dose  . acetaminophen (TYLENOL) tablet 650 mg  650 mg Oral Q4H PRN Flint Melter, MD      . alum & mag hydroxide-simeth (MAALOX/MYLANTA) 200-200-20 MG/5ML suspension 30 mL  30 mL Oral PRN Flint Melter, MD      . folic acid (FOLVITE) tablet 1 mg  1 mg Oral Daily Flint Melter, MD   1 mg at 09/11/13 1017  . ibuprofen (ADVIL,MOTRIN) tablet 600 mg  600 mg Oral Q8H PRN Flint Melter, MD      . LORazepam (ATIVAN) tablet 1 mg  1 mg Oral Q6H PRN Flint Melter, MD   1 mg at 09/11/13 1610   Or  . LORazepam  (ATIVAN) injection 1 mg  1 mg Intravenous Q6H PRN Flint Melter, MD      . multivitamin with minerals tablet 1 tablet  1 tablet Oral Daily Flint Melter, MD   1 tablet at 09/11/13 1017  . nicotine (NICODERM CQ - dosed in mg/24 hours) patch 21 mg  21 mg Transdermal Daily PRN Flint Melter, MD      . ondansetron [facility]) tablet 4 mg  4 mg Oral Q8H PRN Flint Melter, MD   4 mg at 09/11/13 1020  . thiamine (VITAMIN B-1) tablet 100 mg  100 mg Oral Daily Flint Melter, MD   100 mg at 09/11/13 1017   Or  . thiamine (B-1) injection 100 mg  100 mg Intravenous Daily Flint Melter, MD      . zolpidem (AMBIEN) tablet 5 mg  5 mg Oral QHS PRN Flint Melter, MD       Current Outpatient Prescriptions  Medication Sig Dispense Refill  . acamprosate (CAMPRAL) 333 MG tablet Take 666 mg by mouth 3 (three) times daily.      Marland Kitchen buPROPion (WELLBUTRIN XL) 150 MG 24 hr tablet Take 150 mg by mouth daily.      . DiphenhydrAMINE HCl, Sleep, (ZZZQUIL PO) Take by mouth at bedtime. Patient states she takes 3 capfuls at bedtime.      . diphenoxylate-atropine (LOMOTIL) 2.5-0.025 MG per tablet Take 2 tablets by mouth 2 (two) times daily. For loose stools  4 tablet  0  . escitalopram (LEXAPRO) 20 MG tablet Take 1 tablet (20 mg total) by mouth daily. For depression  30 tablet  0  . ezetimibe (ZETIA) 10 MG tablet Take 1 tablet (10 mg total) by mouth daily. For cholesterol control      . hydrOXYzine (ATARAX/VISTARIL) 25 MG tablet Take 1 tablet (25 mg total) by mouth at bedtime as needed (sleep). For anxiety/sleep  30 tablet  0  . lisinopril (PRINIVIL,ZESTRIL) 5 MG tablet Take 1 tablet (5 mg total) by mouth daily. For high blood pressure control      . naproxen sodium (ANAPROX) 220 MG tablet Take 880 mg by mouth daily as needed (pain).      . nebivolol (BYSTOLIC) 5 MG tablet Take 1 tablet (5 mg total) by mouth daily. For high blood pressure control  30 tablet    . ondansetron (ZOFRAN-ODT) 8 MG disintegrating tablet Take 8  mg by mouth 2 (two) times daily.      . QUEtiapine (SEROQUEL) 25 MG tablet Take 25 mg by mouth at bedtime.        Psychiatric Specialty Exam:     Blood pressure 177/92, pulse 99,  temperature 98.6 F (37 C), temperature source Oral, resp. rate 16, SpO2 95.00%.There is no weight on file to calculate BMI.  General Appearance: Casual and Disheveled  Eye Contact::  Good  Speech:  Clear and Coherent and Normal Rate  Volume:  Normal  Mood:  Depressed  Affect:  Depressed  Thought Process:  Circumstantial and Goal Directed  Orientation:  Full (Time, Place, and Person)  Thought Content:  Rumination  Suicidal Thoughts:  No  Homicidal Thoughts:  No  Memory:  Immediate;   Good Recent;   Good Remote;   Good  Judgement:  Fair  Insight:  Lacking and Present  Psychomotor Activity:  Tremor  Concentration:  Good  Recall:  Good  Akathisia:  No  Handed:  Right  AIMS (if indicated):     Assets:  Communication Skills  Sleep:      Treatment Plan Summary: Daily contact with patient to assess and evaluate symptoms and progress in treatment Medication management  Disposition:  Detox protocol. Patient accepted to [facility] 300 hall.  Start Librium protocol for alcohol detox.  Patient may complete detox prior to 300 hall bed availability.  Will continue with Ativan detox related to elevated AST  Rankin, Shuvon, FNP-BC 09/11/2013 12:39 PM

## 2013-09-11 NOTE — Progress Notes (Signed)
Adult Psychoeducational Group Note  Date:  09/11/2013 Time:  9:58 PM  Group Topic/Focus:  Wrap-Up Group:   The focus of this group is to help patients review their daily goal of treatment and discuss progress on daily workbooks.  Participation Level:  Did Not Attend   Modena Nunnery 09/11/2013, 9:58 PM

## 2013-09-11 NOTE — ED Notes (Signed)
Pt transferred from TCU, presents requesting Alcohol detox. Reports she  has had multiple relapses recently.  Pt reports she drinks 2-3/4 packs of wine per day.  Denies street drugs.  No SI, or HI, no AV hallucinations.  Pt reports she has a hx of PTSD and Depression. Pt calm & cooperative.

## 2013-09-11 NOTE — BH Assessment (Signed)
Pt accepted to [facility] assigned to bed 306-2.   Glorious Peach, MS, LCASA Assessment Counselor

## 2013-09-11 NOTE — Consult Note (Signed)
Note reviewed and agreed with  

## 2013-09-11 NOTE — BHH Counselor (Addendum)
Patient has been accepted to a 300 Hall Bed to [facility] by Dr. Ladona Ridgel.   Writer informed Idalia Needle) at the [facility] that the patient has been accepted.

## 2013-09-11 NOTE — ED Notes (Signed)
Wentz, MD at bedside.  

## 2013-09-11 NOTE — ED Provider Notes (Signed)
CSN: [id number]     Arrival date & time 09/10/13  2137 History   First MD Initiated Contact with Patient 09/11/13 0055     Chief Complaint  Patient presents with  . Medical Clearance   (Consider location/radiation/quality/duration/timing/severity/associated sxs/prior Treatment) HPI Comments: [name] is a 67 y.o. female who states that she is here for detox from alcohol. She has tried cessation on her own, but becomes shaky. She states that 2 weeks ago she stopped on her own, for a short period, but has now resumed drinking. She relates stress to to a divorce that is in process. She does not live with her husband, but he brought her here tonight, because, "he supports my sobriety." She denies use of illegal drugs. She denies recent illnesses. She has never been in clinical DTs. She reports depression related to the divorce, but no suicidal or homicidal ideation. There are no other known modifying factors.   The history is provided by the patient.    Past Medical History  Diagnosis Date  . Alcohol abuse   . Diabetes mellitus   . Hypertension   . Fibromyalgia   . Depression   . Carotid artery stenosis    Past Surgical History  Procedure Laterality Date  . Cesarean section    . Tonsillectomy    . Bunionectomy     No family history on file. History  Substance Use Topics  . Smoking status: Former Smoker    Quit date: 03/11/2010  . Smokeless tobacco: Never Used  . Alcohol Use: Yes     Comment: 4-5 bottles of wine per day   OB History   Grav Para Term Preterm Abortions TAB SAB Ect Mult Living                 Review of Systems  All other systems reviewed and are negative.    Allergies  Other; Latex; Lipitor; Shellfish allergy; Codeine; Pentazocine lactate; and Sulfonamide derivatives  Home Medications   No current outpatient prescriptions on file. BP 180/82  Pulse 94  Temp(Src) 98.2 F (36.8 C) (Oral)  Resp 18  SpO2 95% Physical Exam  Nursing note and  vitals reviewed. Constitutional: She is oriented to person, place, and time. She appears well-developed and well-nourished. No distress.  Lucid and cooperative  HENT:  Head: Normocephalic and atraumatic.  Eyes: Conjunctivae and EOM are normal. Pupils are equal, round, and reactive to light.  Neck: Normal range of motion and phonation normal. Neck supple.  Cardiovascular: Normal rate, regular rhythm and intact distal pulses.   Pulmonary/Chest: Effort normal and breath sounds normal. She exhibits no tenderness.  Abdominal: Soft. She exhibits no distension. There is no tenderness. There is no guarding.  Musculoskeletal: Normal range of motion.  Neurological: She is alert and oriented to person, place, and time. No cranial nerve deficit. She exhibits normal muscle tone. Coordination normal.  No tremor  Skin: Skin is warm and dry.  Psychiatric: She has a normal mood and affect. Her behavior is normal. Judgment and thought content normal.    ED Course  Procedures (including critical care time)  Medications  ondansetron (ZOFRAN-ODT) disintegrating tablet 4 mg (4 mg Oral Given 09/11/13 0038)   0105- consult psychiatry  0400- repeat vital signs are normal. She is medically cleared for psychiatric admission, versus discharged if patient is agreeable; with expected legal sobriety at 1200 hours, today.  Labs Review Labs Reviewed  COMPREHENSIVE METABOLIC PANEL - Abnormal; Notable for the following:    Sodium  131 (*)    Chloride 92 (*)    Glucose, Bld 186 (*)    All other components within normal limits  ETHANOL - Abnormal; Notable for the following:    Alcohol, Ethyl (B) 288 (*)    All other components within normal limits  SALICYLATE LEVEL - Abnormal; Notable for the following:    Salicylate Lvl <2.0 (*)    All other components within normal limits  ACETAMINOPHEN LEVEL  CBC  URINE RAPID DRUG SCREEN (HOSP PERFORMED)   Imaging Review No results found.  MDM   1. Alcoholism   2.  Alcohol intoxication      Alcoholism with heavy alcohol use, recurrent. No history of DTs. Patient observed in the ED for early signs of alcohol withdrawal.    Flint Melter, MD 09/12/13 1053

## 2013-09-11 NOTE — Progress Notes (Signed)
   CARE MANAGEMENT ED NOTE 09/11/2013  Patient:  [name], [name]   Account Number:  [id number]  Date Initiated:  09/11/2013  Documentation initiated by:  Edd Arbour  Subjective/Objective Assessment:   67 yr old female requesting detox for admisison to [facility]     Subjective/Objective Assessment Detail:     Action/Plan:   Action/Plan Detail:   Spoke with pt who states pcp is [facility] - no particular MD name   Anticipated DC Date:  09/11/2013     Status Recommendation to Physician:   Result of Recommendation:    Other ED Services  Consult Working Plan    DC Planning Services  Other  PCP issues  Outpatient Services - Pt will follow up    Choice offered to / List presented to:            Status of service:  Completed, signed off  ED Comments:   ED Comments Detail:

## 2013-09-12 DIAGNOSIS — F1994 Other psychoactive substance use, unspecified with psychoactive substance-induced mood disorder: Secondary | ICD-10-CM

## 2013-09-12 DIAGNOSIS — F329 Major depressive disorder, single episode, unspecified: Secondary | ICD-10-CM

## 2013-09-12 DIAGNOSIS — F3289 Other specified depressive episodes: Secondary | ICD-10-CM

## 2013-09-12 MED ORDER — ESCITALOPRAM OXALATE 20 MG PO TABS
20.0000 mg | ORAL_TABLET | Freq: Every day | ORAL | Status: DC
  Administered 2013-09-12 – 2013-09-15 (×4): 20 mg via ORAL
  Filled 2013-09-12 (×3): qty 1
  Filled 2013-09-12: qty 4
  Filled 2013-09-12 (×2): qty 1

## 2013-09-12 MED ORDER — CHLORDIAZEPOXIDE HCL 25 MG PO CAPS
25.0000 mg | ORAL_CAPSULE | Freq: Three times a day (TID) | ORAL | Status: AC
  Administered 2013-09-13: 25 mg via ORAL
  Filled 2013-09-12 (×2): qty 1

## 2013-09-12 MED ORDER — CHLORDIAZEPOXIDE HCL 25 MG PO CAPS
25.0000 mg | ORAL_CAPSULE | Freq: Four times a day (QID) | ORAL | Status: AC
  Administered 2013-09-12 (×3): 25 mg via ORAL
  Filled 2013-09-12 (×3): qty 1

## 2013-09-12 MED ORDER — CHLORDIAZEPOXIDE HCL 25 MG PO CAPS: 25.0000 mg | ORAL_CAPSULE | Freq: Every day | ORAL | Status: DC

## 2013-09-12 MED ORDER — ONDANSETRON 4 MG PO TBDP: 4.0000 mg | ORAL_TABLET | Freq: Four times a day (QID) | ORAL | Status: AC | PRN

## 2013-09-12 MED ORDER — LOPERAMIDE HCL 2 MG PO CAPS
2.0000 mg | ORAL_CAPSULE | ORAL | Status: DC | PRN
  Administered 2013-09-12: 4 mg via ORAL
  Administered 2013-09-13: 2 mg via ORAL
  Administered 2013-09-13: 4 mg via ORAL
  Filled 2013-09-12: qty 2
  Filled 2013-09-12: qty 1
  Filled 2013-09-12: qty 2

## 2013-09-12 MED ORDER — ACAMPROSATE CALCIUM 333 MG PO TBEC
666.0000 mg | DELAYED_RELEASE_TABLET | Freq: Three times a day (TID) | ORAL | Status: DC
  Administered 2013-09-12 – 2013-09-15 (×10): 666 mg via ORAL
  Filled 2013-09-12 (×2): qty 2
  Filled 2013-09-12: qty 24
  Filled 2013-09-12 (×4): qty 2
  Filled 2013-09-12 (×2): qty 24
  Filled 2013-09-12 (×6): qty 2

## 2013-09-12 MED ORDER — CHLORDIAZEPOXIDE HCL 25 MG PO CAPS
25.0000 mg | ORAL_CAPSULE | ORAL | Status: AC
  Administered 2013-09-14 – 2013-09-15 (×2): 25 mg via ORAL
  Filled 2013-09-12 (×2): qty 1

## 2013-09-12 NOTE — Progress Notes (Signed)
Report received from [place] RN, admission not completed and will be completed by Clinical research associate. Patient admitted voluntarily requesting etoh detox. Patient reports that she drinks 4/5 small 4 packs of wine daily. Patient reports she has 2 dwi charges in [place] and is on probation. Patient reports that her daughter and grandchildren live with her. 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 Patients blood pressure was elevated upon admission and order received from NP for bp meds to be started. Medical hx include HTN, fibromyalgia, carotid artery stenosis, diabetes with diet controlled. Patient informed of meds available if needed. Safety maintained with 15 min checks, will continue to monitor.

## 2013-09-12 NOTE — H&P (Signed)
Psychiatric Admission Assessment Adult  Patient Identification:  [name] Date of Evaluation:  09/12/2013 Chief Complaint:  ALCOHOL DEPENDNECE DEPRESSIVE DISORDER NOS ANXIETY DISORDER NOS History of Present Illness:: Another spiral of relapsing since December. She drinks for few weeks, then stops. She was drinking 8 to 12 mall bottles of wine. Last December husband of 26 years  walked out. Holidays happened then found out significant amount of money was going to another woman. States she has been dealing with anger, loss Elements:  Location:  in patient. Quality:  unbale to funtion. Severity:  severe. Timing:  every day. Duration:  lst several months. Context:  alcohol dependent, unable to accomplish sobriety, underlying mood/anxiety disorder. Associated Signs/Synptoms: Depression Symptoms:  depressed mood, anhedonia, anxiety, disturbed sleep, (Hypo) Manic Symptoms:  Denies Anxiety Symptoms:  Excessive Worry, Psychotic Symptoms:  Denies PTSD Symptoms: Had a traumatic exposure:  son' s suicide ( called her before she killed himself) Re-experiencing:  Intrusive Thoughts Nightmares  Psychiatric Specialty Exam: Physical Exam  Review of Systems  Constitutional: Positive for malaise/fatigue.  HENT: Negative.   Eyes: Negative.   Respiratory: Negative.   Cardiovascular: Negative.   Gastrointestinal: Positive for nausea.  Genitourinary: Negative.   Musculoskeletal: Negative.   Skin: Negative.   Neurological: Positive for weakness.  Endo/Heme/Allergies: Negative.   Psychiatric/Behavioral: Positive for depression and substance abuse. The patient is nervous/anxious and has insomnia.     Blood pressure 97/65, pulse 92, temperature 98.2 F (36.8 C), temperature source Oral, resp. rate 16, height 4' 11.84" (1.52 m), weight 71.215 kg (157 lb), SpO2 96.00%.Body mass index is 30.82 kg/(m^2).  General Appearance: Disheveled  Eye Solicitor::  Fair  Speech:  Clear and Coherent, Slow  and not spontaneous  Volume:  Decreased  Mood:  Anxious and Depressed  Affect:  Restricted  Thought Process:  Coherent and Goal Directed  Orientation:  Full (Time, Place, and Person)  Thought Content:  symptoms, events, worries, concerns upon qustioning, no spontanous content  Suicidal Thoughts:  No  Homicidal Thoughts:  No  Memory:  Immediate;   Fair Recent;   Fair Remote;   Fair  Judgement:  Fair  Insight:  Present  Psychomotor Activity:  Psychomotor Retardation and Restlessness  Concentration:  Fair  Recall:  Fair  Akathisia:  No  Handed:    AIMS (if indicated):     Assets:  Desire for Improvement Housing  Sleep:  Number of Hours: 6.25    Past Psychiatric History: Diagnosis:  Hospitalizations: [facility], [facility]  Outpatient Care: [facility] , Dr. Evelene Croon  Substance Abuse Care: [facility] in March  Self-Mutilation: Denies  Suicidal Attempts: Denies  Violent Behaviors: While intoxicated   Past Medical History:   Past Medical History  Diagnosis Date  . Alcohol abuse   . Diabetes mellitus   . Hypertension   . Fibromyalgia   . Depression   . Carotid artery stenosis     Allergies:   Allergies  Allergen Reactions  . Other Anaphylaxis    shellfish  . Latex Other (See Comments)    Break outs  . Lipitor [Atorvastatin Calcium] Other (See Comments)    Muscle aches  . Shellfish Allergy   . Codeine Rash    And nausea   . Pentazocine Lactate Rash  . Sulfonamide Derivatives Rash   PTA Medications: Prescriptions prior to admission  Medication Sig Dispense Refill  . acamprosate (CAMPRAL) 333 MG tablet Take 666 mg by mouth 3 (three) times daily.      Marland Kitchen amphetamine-dextroamphetamine (  ADDERALL) 30 MG tablet Take 60 mg by mouth daily.      Marland Kitchen escitalopram (LEXAPRO) 20 MG tablet Take 20 mg by mouth daily.      Marland Kitchen ezetimibe (ZETIA) 10 MG tablet Take 10 mg by mouth daily.      . fish oil-omega-3 fatty acids 1000 MG capsule Take 4 g by mouth daily.      Marland Kitchen lisinopril  (PRINIVIL,ZESTRIL) 5 MG tablet Take 5 mg by mouth daily.      . nebivolol (BYSTOLIC) 5 MG tablet Take 5 mg by mouth daily.        Previous Psychotropic Medications:  Medication/Dose  Lexapro 20, Adderall ER 30 mg BID, Lunesta               Substance Abuse History in the last 12 months:  yes  Consequences of Substance Abuse: Legal Consequences:  DWI no license Blackouts:   Withdrawal Symptoms:   Diaphoresis Diarrhea Nausea Tremors Vomiting perceptual changes  Social History:  reports that she quit smoking about 3 years ago. She has never used smokeless tobacco. She reports that  drinks alcohol. She reports that she does not use illicit drugs. Additional Social History:                      Current Place of Residence:   Place of Birth:   Family Members: Marital Status:  Separated Children:  Sons: (22 Y/O killed self)  Daughters: 38 Relationships: Education:  Corporate treasurer Problems/Performance: Religious Beliefs/Practices: History of Abuse (Emotional/Phsycial/Sexual) Occupational Experiences; Tour manager (Retired) Hotel manager History:  None. Legal History: Hobbies/Interests:  Family History:  History reviewed. No pertinent family history.  Results for orders placed during the hospital encounter of 09/10/13 (from the past 72 hour(s))  ACETAMINOPHEN LEVEL     Status: None   Collection Time    09/11/13  1:50 AM      Result Value Range   Acetaminophen (Tylenol), Serum <15.0  10 - 30 ug/mL  CBC     Status: None   Collection Time    09/11/13  1:50 AM      Result Value Range   WBC 7.4  4.0 - 10.5 K/uL   RBC 4.28  3.87 - 5.11 MIL/uL   Hemoglobin 14.0  12.0 - 15.0 g/dL   HCT 81.1  91.4 - 78.2 %   MCV 92.5  78.0 - 100.0 fL   MCH 32.7  26.0 - 34.0 pg   MCHC 35.4  30.0 - 36.0 g/dL   RDW 95.6  21.3 - 08.6 %   Platelets 223  150 - 400 K/uL  COMPREHENSIVE METABOLIC PANEL     Status: Abnormal   Collection Time    09/11/13  1:50 AM      Result  Value Range   Sodium 131 (*) 135 - 145 mEq/L   Potassium 4.3  3.5 - 5.1 mEq/L   Chloride 92 (*) 96 - 112 mEq/L   CO2 23  19 - 32 mEq/L   Glucose, Bld 186 (*) 70 - 99 mg/dL   BUN 13  6 - 23 mg/dL   Creatinine, Ser 5.78  0.50 - 1.10 mg/dL   Calcium 8.5  8.4 - 46.9 mg/dL   Total Protein 7.5  6.0 - 8.3 g/dL   Albumin 3.8  3.5 - 5.2 g/dL   AST 22  0 - 37 U/L   ALT 16  0 - 35 U/L   Alkaline Phosphatase 110  39 - 117  U/L   Total Bilirubin 0.3  0.3 - 1.2 mg/dL   GFR calc non Af Amer >90  >90 mL/min   GFR calc Af Amer >90  >90 mL/min   Comment: (NOTE)     The eGFR has been calculated using the CKD EPI equation.     This calculation has not been validated in all clinical situations.     eGFR's persistently <90 mL/min signify possible Chronic Kidney     Disease.  ETHANOL     Status: Abnormal   Collection Time    09/11/13  1:50 AM      Result Value Range   Alcohol, Ethyl (B) 288 (*) 0 - 11 mg/dL  SALICYLATE LEVEL     Status: Abnormal   Collection Time    09/11/13  1:50 AM      Result Value Range   Salicylate Lvl <2.0 (*) 2.8 - 20.0 mg/dL  URINE RAPID DRUG SCREEN (HOSP PERFORMED)     Status: None   Collection Time    09/11/13  3:40 AM      Result Value Range   Opiates NONE DETECTED  NONE DETECTED   Cocaine NONE DETECTED  NONE DETECTED   Benzodiazepines NONE DETECTED  NONE DETECTED   Amphetamines NONE DETECTED  NONE DETECTED   Tetrahydrocannabinol NONE DETECTED  NONE DETECTED   Barbiturates NONE DETECTED  NONE DETECTED   Comment:            DRUG SCREEN FOR MEDICAL PURPOSES     ONLY.  IF CONFIRMATION IS NEEDED     FOR ANY PURPOSE, NOTIFY LAB     WITHIN 5 DAYS.                LOWEST DETECTABLE LIMITS     FOR URINE DRUG SCREEN     Drug Class       Cutoff (ng/mL)     Amphetamine      1000     Barbiturate      200     Benzodiazepine   200     Tricyclics       300     Opiates          300     Cocaine          300     THC              50   Psychological  Evaluations:  Assessment:   DSM5:  Schizophrenia Disorders:   Obsessive-Compulsive Disorders:   Trauma-Stressor Disorders:   Substance/Addictive Disorders:  Alcohol Related Disorder - Severe (303.90) Depressive Disorders:  Major Depressive Disorder - Moderate (296.22)  AXIS I:  Depressive Disorder NOS and Substance Induced Mood Disorder AXIS II:  Deferred AXIS III:   Past Medical History  Diagnosis Date  . Alcohol abuse   . Diabetes mellitus   . Hypertension   . Fibromyalgia   . Depression   . Carotid artery stenosis    AXIS IV:  problems with primary support group AXIS V:  41-50 serious symptoms  Treatment Plan/Recommendations:  Supportive approach/coping skills/relapse prevention                                                                 Librium Detox protocol  Reassess and optimize treatment for the co morbidities  Treatment Plan Summary: Daily contact with patient to assess and evaluate symptoms and progress in treatment Medication management Current Medications:  Current Facility-Administered Medications  Medication Dose Route Frequency Provider Last Rate Last Dose  . acetaminophen (TYLENOL) tablet 650 mg  650 mg Oral Q4H PRN Shuvon Rankin, NP      . alum & mag hydroxide-simeth (MAALOX/MYLANTA) 200-200-20 MG/5ML suspension 30 mL  30 mL Oral Q4H PRN Evanna Janann August, NP      . ezetimibe (ZETIA) tablet 10 mg  10 mg Oral Daily Evanna Janann August, NP   10 mg at 09/12/13 0836  . folic acid (FOLVITE) tablet 1 mg  1 mg Oral Daily Evanna Janann August, NP   1 mg at 09/12/13 0836  . ibuprofen (ADVIL,MOTRIN) tablet 600 mg  600 mg Oral TID PRN Audrea Muscat, NP      . lisinopril (PRINIVIL,ZESTRIL) tablet 5 mg  5 mg Oral Daily Evanna Janann August, NP   5 mg at 09/12/13 0836  . loperamide (IMODIUM) capsule 2-4 mg  2-4 mg Oral PRN Sanjuana Kava, NP      . LORazepam (ATIVAN) tablet 1 mg  1 mg Oral Q6H PRN  Shuvon Rankin, NP   1 mg at 09/11/13 2026   Or  . LORazepam (ATIVAN) injection 1 mg  1 mg Intravenous Q6H PRN Shuvon Rankin, NP      . LORazepam (ATIVAN) tablet 1 mg  1 mg Oral Q6H PRN Audrea Muscat, NP   1 mg at 09/12/13 0840   Or  . LORazepam (ATIVAN) injection 1 mg  1 mg Intravenous Q6H PRN Evanna Janann August, NP      . magnesium hydroxide (MILK OF MAGNESIA) suspension 30 mL  30 mL Oral Daily PRN Evanna Janann August, NP      . multivitamin with minerals tablet 1 tablet  1 tablet Oral Daily Evanna Janann August, NP   1 tablet at 09/12/13 0836  . nebivolol (BYSTOLIC) tablet 5 mg  5 mg Oral Daily Evanna Janann August, NP   5 mg at 09/11/13 2241  . nicotine (NICODERM CQ - dosed in mg/24 hours) patch 21 mg  21 mg Transdermal Daily PRN Shuvon Rankin, NP      . nicotine (NICODERM CQ - dosed in mg/24 hours) patch 21 mg  21 mg Transdermal Daily Evanna Cori Burkett, NP      . omega-3 acid ethyl esters (LOVAZA) capsule 2 g  2 g Oral BID Rachael Fee, MD   2 g at 09/12/13 [phone]  . ondansetron (ZOFRAN) tablet 4 mg  4 mg Oral Q8H PRN Audrea Muscat, NP   4 mg at 09/12/13 1034  . pneumococcal 23 valent vaccine (PNU-IMMUNE) injection 0.5 mL  0.5 mL Intramuscular Tomorrow-1000 Rachael Fee, MD      . thiamine (VITAMIN B-1) tablet 100 mg  100 mg Oral Daily Evanna Cori Merry Proud, NP   100 mg at 09/12/13 9604   Or  . thiamine (B-1) injection 100 mg  100 mg Intravenous Daily Evanna Janann August, NP      . traZODone (DESYREL) tablet 50 mg  50 mg Oral QHS PRN,MR X 1 Evanna Cori Merry Proud, NP   50 mg at 09/11/13 2318    Observation Level/Precautions:  15 minute checks  Laboratory:  As per the ED  Psychotherapy:  Individual, group  Medications:  [facility]rium Detox/optimize treatment with psychotropics  Consultations:    Discharge Concerns:  Estimated LOS: 3-5 days  Other:     I certify that inpatient services furnished can reasonably be expected to improve the patient's condition.   Sharnita Bogucki  A 10/1/201411:02 AM

## 2013-09-12 NOTE — [facility] Group Notes (Signed)
[facility] LCSW Aftercare Discharge Planning Group Note   09/12/2013  8:45 AM  Participation Quality:  Did Not Attend  Reyes Ivan, LCSWA 09/12/2013 9:35 AM

## 2013-09-12 NOTE — [facility] Group Notes (Signed)
[facility] LCSW Group Therapy  09/12/2013  1:15 PM   Type of Therapy:  Group Therapy  Participation Level:  Did Not Attend  Reyes Ivan, LCSWA 09/12/2013 2:52 PM

## 2013-09-12 NOTE — [facility] Suicide Risk Assessment (Signed)
[facility] INPATIENT: Family/Significant Other Suicide Prevention Education  Suicide Prevention Education:  Education Completed; No one has been identified by the patient as the family member/significant other with whom the patient will be residing, and identified as the person(s) who will aid the patient in the event of a mental health crisis (suicidal ideations/suicide attempt). With written consent from the patient, the family member/significant other has been provided the following suicide prevention education, prior to the and/or following the discharge of the patient.  The suicide prevention education provided includes the following:  Suicide risk factors  Suicide prevention and interventions  National Suicide Hotline telephone number  [facility] assessment telephone number  [facility] Emergency Assistance 911  [facility] and/or Residential Mobile Crisis Unit telephone number Request made of family/significant other to:  Remove weapons (e.g., guns, rifles, knives), all items previously/currently identified as safety concern.  Remove drugs/medications (over-the-counter, prescriptions, illicit drugs), all items previously/currently identified as a safety concern. The family member/significant other verbalizes understanding of the suicide prevention education information provided. The family member/significant other agrees to remove the items of safety concern listed above. Pt did not c/o SI at admission, nor have they endorsed SI during their stay here. SPE not required.  [name], [place] 09/12/2013  2:34 PM

## 2013-09-12 NOTE — Progress Notes (Signed)
D: Patient denies SI/HI and A/V hallucinations; patient reports sleep was well and that she did take some medication for sleep; reports appetite is good but reports some nausea ; reports energy level is low  A: Monitored q 15 minutes; patient encouraged to attend groups; patient educated about medications; patient given medications per physician orders; patient encouraged to express feelings and/or concerns  R: Patient is fatigued; patient reports relief of diarrhea and nausea; patient does still have tremors ; patient's interaction with staff and peers is appropriate and minimal; patient is taking medications as prescribed and tolerating medications; patient is not attending groups

## 2013-09-12 NOTE — [facility] Suicide Risk Assessment (Signed)
Suicide Risk Assessment  Admission Assessment     Nursing information obtained from:  Patient Demographic factors:  Age 65 or older;Divorced or widowed;Caucasian Current Mental Status:  NA Loss Factors:  Decline in physical health;Legal issues Historical Factors:  Prior suicide attempts;Family history of suicide;Family history of mental illness or substance abuse Risk Reduction Factors:  Sense of responsibility to family;Living with another person, especially a relative;Positive social support;Positive coping skills or problem solving skills  CLINICAL FACTORS:   Depression:   Comorbid alcohol abuse/dependence Alcohol/Substance Abuse/Dependencies  COGNITIVE FEATURES THAT CONTRIBUTE TO RISK:  Closed-mindedness Polarized thinking Thought constriction (tunnel vision)    SUICIDE RISK:   Moderate:  Frequent suicidal ideation with limited intensity, and duration, some specificity in terms of plans, no associated intent, good self-control, limited dysphoria/symptomatology, some risk factors present, and identifiable protective factors, including available and accessible social support.  PLAN OF CARE: Supportive approach/coping skills/relapse prevention                               Reassess and address the co morbidities  I certify that inpatient services furnished can reasonably be expected to improve the patient's condition.  Duanne Duchesne A 09/12/2013, 4:31 PM

## 2013-09-12 NOTE — Care Management Utilization Note (Signed)
Per State Regulation 482.30  The chart was reviewed for necessity with respect to the patient's Admission/ Duration of stay. Admission 09/11/13  Next Review Date:09/14/2013  Lacinda Axon, RN, BSN

## 2013-09-12 NOTE — Tx Team (Signed)
Initial Interdisciplinary Treatment Plan  PATIENT STRENGTHS: (choose at least two) Ability for insight Supportive family/friends  PATIENT STRESSORS: Health problems Legal issue   PROBLEM LIST: Problem List/Patient Goals Date to be addressed Date deferred Reason deferred Estimated date of resolution  Etoh abuse 09/11/2013     Depression 09/11/2013                                                DISCHARGE CRITERIA:  Ability to meet basic life and health needs Adequate post-discharge living arrangements Withdrawal symptoms are absent or subacute and managed without 24-hour nursing intervention  PRELIMINARY DISCHARGE PLAN: Return to previous living arrangement  PATIENT/FAMIILY INVOLVEMENT: This treatment plan has been presented to and reviewed with the patient, [name], and/or family member.  The patient and family have been given the opportunity to ask questions and make suggestions.  [name] 09/12/2013, 4:41 AM

## 2013-09-12 NOTE — Progress Notes (Signed)
Pt attended Group 

## 2013-09-12 NOTE — [facility] Counselor (Signed)
Adult Psychosocial Assessment Update Interdisciplinary Team  Previous [facility] admissions/discharges:  Admissions Discharges  Date: 05/26/13 Date: 05/29/13  Date: 06/21/12 Date: 06/24/12  Date: 05/28/12 Date: 05/31/12  Date:  03/12/12 Date: 03/14/12  Date: Date:   Changes since the last Psychosocial Assessment (including adherence to outpatient mental health and/or substance abuse treatment, situational issues contributing to decompensation and/or relapse). Pt states that she was doing well for awhile and was able to maintain sobriety by going to meetings.  Pt states that she noticed she was going in a downward spiral again with her drinking habit and decided to come here for detox.  Pt reports physical health problems which she reports her drinking makes worse.  Pt denies following up with recommended outpatient treatment on last admission and only attending meetings.               Discharge Plan 1. Will you be returning to the same living situation after discharge?   Yes: X Pt will return to own home No:      If no, what is your plan?           2. Would you like a referral for services when you are discharged? Yes: X    If yes, for what services? Pt states at this time she wants to return home, follow up outpatient and go to meetings.   No:              Summary and Recommendations (to be completed by the evaluator) Patient is a 67 year old [name] with a diagnosis of Alcohol Abuse, Anxiety Disorder NOS and Depressive Disorder NOS.  Patient lives in [place].  Patient will benefit from crisis stabilization, medication evaluation, group therapy and psycho education in addition to case management for discharge planning.                         Signature:  Carmina Miller, 09/12/2013 8:27 AM

## 2013-09-12 NOTE — Tx Team (Signed)
Interdisciplinary Treatment Plan Update (Adult)  Date: 09/12/2013  Time Reviewed:  9:45 AM  Progress in Treatment: Attending groups: Yes Participating in groups:  Yes Taking medication as prescribed:  Yes Tolerating medication:  Yes Family/Significant othe contact made: CSW assessing Patient understands diagnosis:  Yes Discussing patient identified problems/goals with staff:  Yes Medical problems stabilized or resolved:  Yes Denies suicidal/homicidal ideation: Yes Issues/concerns per patient self-inventory:  Yes Other:  New problem(s) identified: N/A  Discharge Plan or Barriers: CSW assessing for appropriate referrals.    Reason for Continuation of Hospitalization: Anxiety Depression Medication Stabilization  Comments: N/A  Estimated length of stay: 3-5 days  For review of initial/current patient goals, please see plan of care.  Attendees: Patient:     Family:     Physician:  Dr. Dub Mikes 09/12/2013 10:26 AM   Nursing:   Burnetta Sabin, RN 09/12/2013 10:26 AM   Clinical Social Worker:  Reyes Ivan, LCSWA 09/12/2013 10:26 AM   Other: Onnie Boer, RN case manager 09/12/2013 10:26 AM   Other:  Trula Slade, LCSWA 09/12/2013 10:26 AM   Other:  Serena Colonel, Np 09/12/2013 10:26 AM   Other:  Roswell Miners, RN 09/12/2013 10:26 AM   Other:    Other:    Other:    Other:    Other:    Other:     Scribe for Treatment Team:   Carmina Miller, 09/12/2013 , 10:26 AM

## 2013-09-13 DIAGNOSIS — F411 Generalized anxiety disorder: Secondary | ICD-10-CM

## 2013-09-13 MED ORDER — ESZOPICLONE 2 MG PO TABS
3.0000 mg | ORAL_TABLET | Freq: Every evening | ORAL | Status: DC | PRN
  Administered 2013-09-13 – 2013-09-14 (×2): 3 mg via ORAL
  Filled 2013-09-13 (×2): qty 2

## 2013-09-13 MED ORDER — NON FORMULARY: 3.0000 mg | Freq: Every evening | Status: DC | PRN

## 2013-09-13 NOTE — [facility] Group Notes (Signed)
[facility] LCSW Group Therapy  09/13/2013  1:15 PM   Type of Therapy:  Group Therapy  Participation Level:  Did Not Attend  Reyes Ivan, LCSWA 09/13/2013 2:56 PM

## 2013-09-13 NOTE — Progress Notes (Signed)
[facility] MD Progress Note  09/13/2013 4:38 PM [name]  MRN:  [id number] Subjective:  Continues to detox. She is still having diarrhea, malaise. She is trying to get her life back together. She does not think that she can be there for her daughter as she is not committed to her own recovery. She feels she will eventually have to put some distance between them. She is waiting until the divorce is final before making a move. Admits that with each relapse she feels worst and worst.   Diagnosis:   DSM5: Schizophrenia Disorders:   Obsessive-Compulsive Disorders:   Trauma-Stressor Disorders:   Substance/Addictive Disorders:  Alcohol Related Disorder - Severe (303.90) Depressive Disorders:  Major Depressive Disorder - Moderate (296.22)  Axis I: Anxiety Disorder NOS  ADL's:  Intact  Sleep: Fair  Appetite:  Poor  Suicidal Ideation:  Plan:  denies Intent:  denies Means:  denies Homicidal Ideation:  Plan:  denies Intent:  denies Means:  denies AEB (as evidenced by):  Psychiatric Specialty Exam: Review of Systems  Constitutional: Positive for malaise/fatigue.  HENT: Negative.   Eyes: Negative.   Respiratory: Negative.   Cardiovascular: Negative.   Gastrointestinal: Positive for diarrhea.  Genitourinary: Negative.   Musculoskeletal: Negative.   Skin: Negative.   Neurological: Positive for weakness.  Endo/Heme/Allergies: Negative.   Psychiatric/Behavioral: Positive for depression and substance abuse. The patient is nervous/anxious.     Blood pressure 90/55, pulse 106, temperature 98.2 F (36.8 C), temperature source Oral, resp. rate 18, height 4' 11.84" (1.52 m), weight 71.215 kg (157 lb), SpO2 96.00%.Body mass index is 30.82 kg/(m^2).  General Appearance: Fairly Groomed  Patent attorney::  Fair  Speech:  Clear and Coherent, Slow and not spontaneous  Volume:  Decreased  Mood:  Anxious and Depressed  Affect:  Restricted  Thought Process:  Coherent and Goal Directed  Orientation:   Full (Time, Place, and Person)  Thought Content:  worries, concerns  Suicidal Thoughts:  No  Homicidal Thoughts:  No  Memory:  Immediate;   Fair Recent;   Fair Remote;   Fair  Judgement:  Fair  Insight:  Present  Psychomotor Activity:  Restlessness  Concentration:  Fair  Recall:  Fair  Akathisia:  No  Handed:    AIMS (if indicated):     Assets:  Desire for Improvement  Sleep:  Number of Hours: 6   Current Medications: Current Facility-Administered Medications  Medication Dose Route Frequency Provider Last Rate Last Dose  . acamprosate (CAMPRAL) tablet 666 mg  666 mg Oral TID Rachael Fee, MD   666 mg at 09/13/13 1210  . acetaminophen (TYLENOL) tablet 650 mg  650 mg Oral Q4H PRN Shuvon Rankin, NP      . alum & mag hydroxide-simeth (MAALOX/MYLANTA) 200-200-20 MG/5ML suspension 30 mL  30 mL Oral Q4H PRN Evanna Janann August, NP      . chlordiazePOXIDE (LIBRIUM) capsule 25 mg  25 mg Oral TID Rachael Fee, MD   25 mg at 09/13/13 1914   Followed by  . [START ON 09/14/2013] chlordiazePOXIDE (LIBRIUM) capsule 25 mg  25 mg Oral BH-qamhs Rachael Fee, MD       Followed by  . [START ON 09/15/2013] chlordiazePOXIDE (LIBRIUM) capsule 25 mg  25 mg Oral Daily Rachael Fee, MD      . escitalopram (LEXAPRO) tablet 20 mg  20 mg Oral Daily Rachael Fee, MD   20 mg at 09/13/13 0814  . eszopiclone (LUNESTA) tablet 3 mg  3 mg Oral QHS PRN Rachael Fee, MD      . ezetimibe (ZETIA) tablet 10 mg  10 mg Oral Daily Evanna Janann August, NP   10 mg at 09/13/13 0815  . folic acid (FOLVITE) tablet 1 mg  1 mg Oral Daily Evanna Janann August, NP   1 mg at 09/13/13 0814  . ibuprofen (ADVIL,MOTRIN) tablet 600 mg  600 mg Oral TID PRN Audrea Muscat, NP   600 mg at 09/13/13 0121  . lisinopril (PRINIVIL,ZESTRIL) tablet 5 mg  5 mg Oral Daily Evanna Janann August, NP   5 mg at 09/12/13 0836  . loperamide (IMODIUM) capsule 2-4 mg  2-4 mg Oral PRN Sanjuana Kava, NP   2 mg at 09/13/13 1211  . LORazepam (ATIVAN)  tablet 1 mg  1 mg Oral Q6H PRN Shuvon Rankin, NP   1 mg at 09/11/13 2026   Or  . LORazepam (ATIVAN) injection 1 mg  1 mg Intravenous Q6H PRN Shuvon Rankin, NP      . magnesium hydroxide (MILK OF MAGNESIA) suspension 30 mL  30 mL Oral Daily PRN Evanna Janann August, NP      . multivitamin with minerals tablet 1 tablet  1 tablet Oral Daily Evanna Janann August, NP   1 tablet at 09/13/13 0815  . nebivolol (BYSTOLIC) tablet 5 mg  5 mg Oral Daily Evanna Janann August, NP   5 mg at 09/11/13 2241  . nicotine (NICODERM CQ - dosed in mg/24 hours) patch 21 mg  21 mg Transdermal Daily PRN Shuvon Rankin, NP      . nicotine (NICODERM CQ - dosed in mg/24 hours) patch 21 mg  21 mg Transdermal Daily Evanna Cori Burkett, NP      . omega-3 acid ethyl esters (LOVAZA) capsule 2 g  2 g Oral BID Rachael Fee, MD   2 g at 09/13/13 0813  . ondansetron (ZOFRAN) tablet 4 mg  4 mg Oral Q8H PRN Audrea Muscat, NP   4 mg at 09/12/13 1034  . ondansetron (ZOFRAN-ODT) disintegrating tablet 4 mg  4 mg Oral Q6H PRN Rachael Fee, MD      . thiamine (VITAMIN B-1) tablet 100 mg  100 mg Oral Daily Evanna Cori Merry Proud, NP   100 mg at 09/13/13 0815   Or  . thiamine (B-1) injection 100 mg  100 mg Intravenous Daily Evanna Janann August, NP      . traZODone (DESYREL) tablet 50 mg  50 mg Oral QHS PRN,MR X 1 Evanna Cori Merry Proud, NP   50 mg at 09/13/13 0121    Lab Results: No results found for this or any previous visit (from the past 48 hour(s)).  Physical Findings: AIMS: Facial and Oral Movements Muscles of Facial Expression: None, normal Lips and Perioral Area: None, normal Jaw: None, normal Tongue: None, normal,Extremity Movements Upper (arms, wrists, hands, fingers): None, normal Lower (legs, knees, ankles, toes): None, normal, Trunk Movements Neck, shoulders, hips: None, normal, Overall Severity Severity of abnormal movements (highest score from questions above): None, normal Incapacitation due to abnormal movements: None,  normal Patient's awareness of abnormal movements (rate only patient's report): No Awareness, Dental Status Current problems with teeth and/or dentures?: No Does patient usually wear dentures?: No  CIWA:  CIWA-Ar Total: 0 COWS:     Treatment Plan Summary: Daily contact with patient to assess and evaluate symptoms and progress in treatment Medication management  Plan: Supportive approach/coping skills/relapse prevention  Continue the detox           Monitor BP           Monitor diarrhea (consider Lomotil if Imodium not effective)  Medical Decision Making Problem Points:  Review of psycho-social stressors (1) Data Points:  Review of medication regiment & side effects (2)  I certify that inpatient services furnished can reasonably be expected to improve the patient's condition.   Jeanene Mena A 09/13/2013, 4:38 PM

## 2013-09-13 NOTE — Progress Notes (Signed)
D.  Pt. Cooperative. Refused 1700 dose of Librium this evening reporting that it is making her sleepy.  Reports diarrhea is better.  Requested Ginger Ale. A.  Ginger Ale given.  Support given. R.  Pt. Receptive.

## 2013-09-13 NOTE — Progress Notes (Signed)
D: Pt mood is depressed but she brightens upon approach. Pt attended group tonight and interacts well within the milieu.   A: Support given. Verbalization encouraged. Pt encouraged to come to staff with any concerns. Medications given as ordered.  R: Pt is receptive. No complaints of pain or discomfort at this time. Q15 min safety checks maintained. Pt remains safe on the unit.

## 2013-09-13 NOTE — Progress Notes (Signed)
D: Patient denies SI/HI and A/V hallucinations; patient reporting diarrhea and after prn medications given she reports some relief and reports that this she is dealing with her depression more so than the withdrawal    A: Monitored q 15 minutes; patient encouraged to attend groups; patient educated about medications; patient given medications per physician orders; patient encouraged to express feelings and/or concerns  R: Patient is cooperative and fatigue and refused her Librium because she thinks it is the cause; patient's interaction with staff and peers is appropriate; patient was able to set goal to talk with staff 1:1 when having feelings of SI; patient is taking medications as prescribed and tolerating medications; patient is attending some groups and engaging

## 2013-09-13 NOTE — Progress Notes (Signed)
Physician made aware of blood pressure this morning. Patient blood pressure warrants blood pressure medications to be held. Nurse Practitioner Elsie Saas was made that medications were held and not given and she said that it was okay. She also wanted the blood pressure reassessed.

## 2013-09-13 NOTE — Progress Notes (Signed)
Recreation Therapy Notes  Date: 10.02.2014 Time: 2:30.2014 Location: [address] Dayroom  Group Topic: Software engineer Activities (AAA)  Behavioral Response: Did not attend   [employer], LRT/CTRS  Laura Radilla L 09/13/2013 5:20 PM

## 2013-09-13 NOTE — Progress Notes (Signed)
Patient ID: [name], female   DOB: 19-Sep-1946, 67 y.o.   MRN: [id number] Pt was asleep in bed and did not attend 11am group.

## 2013-09-13 NOTE — Consult Note (Signed)
Note reviewed and agreed with  

## 2013-09-14 NOTE — Progress Notes (Signed)
Psychoeducational Group Note  Date:  09/14/2013 Time:  2000  Group Topic/Focus:  AA group  Participation Level: Did Not Attend  Participation Quality:  Not Applicable  Affect:  Not Applicable  Cognitive:  Not Applicable  Insight:  Not Applicable  Engagement in Group: Not Applicable  Additional Comments:    Flonnie Hailstone 09/14/2013, 9:40 PM

## 2013-09-14 NOTE — Progress Notes (Signed)
Patient did not attend the evening karaoke group. Pt remained in bed during group.  

## 2013-09-14 NOTE — [facility] Group Notes (Signed)
[facility] LCSW Aftercare Discharge Planning Group Note   09/14/2013 4:18 PM  Participation Quality:  Appropriate   Mood/Affect:  Irritable  Depression Rating:  5  Anxiety Rating:  7  Thoughts of Suicide:  No Will you contract for safety?   NA  Current AVH:  No  Plan for Discharge/Comments:  Pt reported that she sees Dr. Evelene Croon for med management and Haydee Monica. For therapy (Dr. Carie Caddy office). She reports that she will be responsible for making her own appts. Pt stated that she lives with her daughter and 2 grandchildren. She was recently divorced and reports increased depression due to the loss of this relationship. Pt plans to return home and follow up o/p. Pt lives in [place] [facility] [place]   Transportation Means: daughter  Supports: daughter/some family supports identified.   Smart, [employer]

## 2013-09-14 NOTE — Progress Notes (Signed)
Adult Psychoeducational Group Note  Date:  09/14/2013 Time:  2:45 PM  Group Topic/Focus:  The activity encouraged patients to choose a letter on the whiteboard and describe a positive quality that begins with that letter.  Participation Level:  Minimal  Participation Quality:  Attentive  Affect:  Appropriate and flat  Cognitive:  Alert and Appropriate  Insight: Improving  Engagement in Group:  Lacking  Modes of Intervention:  Activity, Discussion, Education and Support  Additional Comments:  Pt chose the letter M for multi-tasking. 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

## 2013-09-14 NOTE — Progress Notes (Signed)
D: Patient denies SI/HI and A/V hallucinations; patient reports sleep is fair and she requested medication; reports appetite is improving ; reports energy level is low; reports ability to pay attention is improving; rates depression as 5/10; rates hopelessness 3/10; patient reports some diarrhea but does not desire anything at this time and patient reports some sedation per self inventory and has refused the her librium  A: Monitored q 15 minutes; patient encouraged to attend groups; patient educated about medications; patient given medications per physician orders; patient encouraged to express feelings and/or concerns  R: Patient is brighter in affect; patient is cooperative and appropriate to circumstances; patient is  patient's interaction with staff and peers is appropriate; patient was able to set goal to talk with staff 1:1 when having feelings of SI; patient is taking medications as prescribed and tolerating medications; patient is attending all groups and engaging

## 2013-09-14 NOTE — Progress Notes (Signed)
Recreation Therapy Notes  Date: 10.02.2014 Time: 3:00pm Location: [address] Dayroom   Group Topic: Communication, Team Building, Problem Solving  Goal Area(s) Addresses:  Patient will effectively work with peers towards shared goal.  Patient will identify skill used to make activity successful.  Patient will identify how skills used during activity can be used to reach post d/c goals.   Behavioral Response: Did not attend.   Marykay Lex Izzy [place], LRT/CTRS  Aleen Marston L 09/14/2013 8:44 AM

## 2013-09-14 NOTE — [facility] Group Notes (Signed)
[facility] LCSW Group Therapy  09/14/2013  1:15 PM   Type of Therapy:  Group Therapy  Participation Level: Minimal  Participation Quality:  Minimal  Affect:  Appropriate and Irritable  Cognitive:  Alert and Appropriate  Insight:  Limited, lacking  Engagement in Therapy: Limited  Modes of Intervention:  Clarification, Confrontation, Discussion, Education, Exploration, Limit-setting, Orientation, Problem-solving, Rapport Building, Dance movement psychotherapist, Socialization and Support  Summary of Progress/Problems: The topic for today was feelings about relapse.  Pt discussed what relapse prevention is to them and identified triggers that they are on the path to relapse.  Pt processed their feeling towards relapse and was able to relate to peers.  Pt discussed coping skills that can be used for relapse prevention.   Pt came towards the end of group and did not share personally but appeared to actively listen to group discussion.    [name], [place] 09/14/2013 2:38 PM

## 2013-09-14 NOTE — [facility] Group Notes (Deleted)
[facility] LCSW Aftercare Discharge Planning Group Note   09/14/2013  8:45 AM  Participation Quality:  Did Not Attend  Reyes Ivan, LCSWA 09/14/2013 9:15 AM

## 2013-09-14 NOTE — Tx Team (Signed)
Interdisciplinary Treatment Plan Update (Adult)  Date: 09/14/2013  Time Reviewed:  9:45 AM  Progress in Treatment: Attending groups: Yes Participating in groups:  Yes Taking medication as prescribed:  Yes Tolerating medication:  Yes Family/Significant othe contact made: No, n/a Patient understands diagnosis:  Yes Discussing patient identified problems/goals with staff:  Yes Medical problems stabilized or resolved:  Yes Denies suicidal/homicidal ideation: Yes Issues/concerns per patient self-inventory:  Yes Other:  New problem(s) identified: Pt continues to have withdrawal symptoms for detox.   Discharge Plan or Barriers: CSW assessing for appropriate referrals.    Reason for Continuation of Hospitalization: Anxiety Depression Detox Medication Stabilization  Comments: N/A  Estimated length of stay: 3-4 days  For review of initial/current patient goals, please see plan of care.  Attendees: Patient:     Family:     Physician:  Dr. Daleen Bo 09/14/2013 10:36 AM   Nursing:   Burnetta Sabin, RN 09/14/2013 10:36 AM   Clinical Social Worker:  Reyes Ivan, LCSWA 09/14/2013 10:36 AM   Other: Onnie Boer, RN case manager 09/14/2013 10:36 AM   Other:  Trula Slade, LCSWA 09/14/2013 10:36 AM   Other:  Serena Colonel, NP 09/14/2013 10:36 AM   Other:  Tomasita Morrow, care manager 09/14/2013 10:36 AM   Other: Onnie Boer, RN case manager 09/14/2013 10:37 AM   Other: Mardella Layman, RN 09/14/2013 10:37 AM   Other:    Other:    Other:    Other:     Scribe for Treatment Team:   Carmina Miller, 09/14/2013 , 10:36 AM

## 2013-09-14 NOTE — Progress Notes (Signed)
Patient ID: [name], female   DOB: 1946/05/18, 67 y.o.   MRN: [id number] [facility] MD Progress Note  09/14/2013 3:52 PM IVORI STORR  MRN:  [id number]  Subjective:  [name] reports that she is feeling anxious today, but made it clear that it was not a withdrawal anxiety. She says her anxiousness is related to her life situations, her pending divorce, contemplating moving back to [place] where she has family. She adds that she is the one to make the decision to stop drinking alcohol. She says she feels that she is getting ready to get discharged to see what else she could do to help her situation.   Diagnosis:   DSM5: Schizophrenia Disorders:   Obsessive-Compulsive Disorders:   Trauma-Stressor Disorders:   Substance/Addictive Disorders:  Alcohol Related Disorder - Severe (303.90) Depressive Disorders:  Major Depressive Disorder - Moderate (296.22)  Axis I: Anxiety Disorder NOS  ADL's:  Intact  Sleep: Fair  Appetite:  Poor  Suicidal Ideation:  Plan:  denies Intent:  denies Means:  denies  Homicidal Ideation:  Plan:  denies Intent:  denies Means:  denies  AEB (as evidenced by):  Psychiatric Specialty Exam: Review of Systems  Constitutional: Positive for malaise/fatigue.  HENT: Negative.   Eyes: Negative.   Respiratory: Negative.   Cardiovascular: Negative.   Gastrointestinal: Positive for diarrhea.  Genitourinary: Negative.   Musculoskeletal: Negative.   Skin: Negative.   Neurological: Positive for weakness.  Endo/Heme/Allergies: Negative.   Psychiatric/Behavioral: Positive for depression and substance abuse. The patient is nervous/anxious.     Blood pressure 108/68, pulse 76, temperature 98 F (36.7 C), temperature source Oral, resp. rate 20, height 4' 11.84" (1.52 m), weight 71.215 kg (157 lb), SpO2 96.00%.Body mass index is 30.82 kg/(m^2).  General Appearance: Fairly Groomed  Patent attorney::  Fair  Speech:  Clear and Coherent, Slow and not spontaneous  Volume:   Decreased  Mood:  Anxious and Depressed  Affect:  Restricted  Thought Process:  Coherent and Goal Directed  Orientation:  Full (Time, Place, and Person)  Thought Content:  worries, concerns  Suicidal Thoughts:  No  Homicidal Thoughts:  No  Memory:  Immediate;   Fair Recent;   Fair Remote;   Fair  Judgement:  Fair  Insight:  Present  Psychomotor Activity:  Restlessness  Concentration:  Fair  Recall:  Fair  Akathisia:  No  Handed:    AIMS (if indicated):     Assets:  Desire for Improvement  Sleep:  Number of Hours: 6.25   Current Medications: Current Facility-Administered Medications  Medication Dose Route Frequency Provider Last Rate Last Dose  . acamprosate (CAMPRAL) tablet 666 mg  666 mg Oral TID Rachael Fee, MD   666 mg at 09/14/13 1211  . acetaminophen (TYLENOL) tablet 650 mg  650 mg Oral Q4H PRN Shuvon Rankin, NP      . alum & mag hydroxide-simeth (MAALOX/MYLANTA) 200-200-20 MG/5ML suspension 30 mL  30 mL Oral Q4H PRN Evanna Janann August, NP      . chlordiazePOXIDE (LIBRIUM) capsule 25 mg  25 mg Oral BH-qamhs Rachael Fee, MD       Followed by  . [START ON 09/15/2013] chlordiazePOXIDE (LIBRIUM) capsule 25 mg  25 mg Oral Daily Rachael Fee, MD      . escitalopram (LEXAPRO) tablet 20 mg  20 mg Oral Daily Rachael Fee, MD   20 mg at 09/14/13 0800  . eszopiclone (LUNESTA) tablet 3 mg  3 mg Oral QHS  PRN Rachael Fee, MD   3 mg at 09/13/13 2234  . ezetimibe (ZETIA) tablet 10 mg  10 mg Oral Daily Evanna Cori Merry Proud, NP   10 mg at 09/14/13 0800  . folic acid (FOLVITE) tablet 1 mg  1 mg Oral Daily Evanna Cori Merry Proud, NP   1 mg at 09/14/13 0800  . ibuprofen (ADVIL,MOTRIN) tablet 600 mg  600 mg Oral TID PRN Audrea Muscat, NP   600 mg at 09/13/13 0121  . lisinopril (PRINIVIL,ZESTRIL) tablet 5 mg  5 mg Oral Daily Evanna Janann August, NP   5 mg at 09/14/13 0800  . loperamide (IMODIUM) capsule 2-4 mg  2-4 mg Oral PRN Sanjuana Kava, NP   2 mg at 09/13/13 1211  . LORazepam  (ATIVAN) tablet 1 mg  1 mg Oral Q6H PRN Shuvon Rankin, NP   1 mg at 09/13/13 2135   Or  . LORazepam (ATIVAN) injection 1 mg  1 mg Intravenous Q6H PRN Shuvon Rankin, NP      . magnesium hydroxide (MILK OF MAGNESIA) suspension 30 mL  30 mL Oral Daily PRN Evanna Janann August, NP      . multivitamin with minerals tablet 1 tablet  1 tablet Oral Daily Evanna Janann August, NP   1 tablet at 09/14/13 0800  . nebivolol (BYSTOLIC) tablet 5 mg  5 mg Oral Daily Evanna Janann August, NP   5 mg at 09/14/13 0800  . nicotine (NICODERM CQ - dosed in mg/24 hours) patch 21 mg  21 mg Transdermal Daily PRN Shuvon Rankin, NP      . nicotine (NICODERM CQ - dosed in mg/24 hours) patch 21 mg  21 mg Transdermal Daily Evanna Cori Burkett, NP      . omega-3 acid ethyl esters (LOVAZA) capsule 2 g  2 g Oral BID Rachael Fee, MD   2 g at 09/14/13 0800  . ondansetron (ZOFRAN) tablet 4 mg  4 mg Oral Q8H PRN Audrea Muscat, NP   4 mg at 09/12/13 1034  . ondansetron (ZOFRAN-ODT) disintegrating tablet 4 mg  4 mg Oral Q6H PRN Rachael Fee, MD      . thiamine (VITAMIN B-1) tablet 100 mg  100 mg Oral Daily Evanna Cori Merry Proud, NP   100 mg at 09/14/13 0800   Or  . thiamine (B-1) injection 100 mg  100 mg Intravenous Daily Evanna Janann August, NP      . traZODone (DESYREL) tablet 50 mg  50 mg Oral QHS PRN,MR X 1 Evanna Cori Merry Proud, NP   50 mg at 09/13/13 0121    Lab Results: No results found for this or any previous visit (from the past 48 hour(s)).  Physical Findings: AIMS: Facial and Oral Movements Muscles of Facial Expression: None, normal Lips and Perioral Area: None, normal Jaw: None, normal Tongue: None, normal,Extremity Movements Upper (arms, wrists, hands, fingers): None, normal Lower (legs, knees, ankles, toes): None, normal, Trunk Movements Neck, shoulders, hips: None, normal, Overall Severity Severity of abnormal movements (highest score from questions above): None, normal Incapacitation due to abnormal  movements: None, normal Patient's awareness of abnormal movements (rate only patient's report): No Awareness, Dental Status Current problems with teeth and/or dentures?: No Does patient usually wear dentures?: No  CIWA:  CIWA-Ar Total: 0 COWS:     Treatment Plan Summary: Daily contact with patient to assess and evaluate symptoms and progress in treatment Medication management  Plan: Supportive approach/coping skills/relapse prevention Complete detox Continue to monitor BP Possible  discharge in am.  Medical Decision Making Problem Points:  Review of psycho-social stressors (1) Data Points:  Review of medication regiment & side effects (2)  I certify that inpatient services furnished can reasonably be expected to improve the patient's condition.   Armandina Stammer I, PMHNP 09/14/2013, 3:52 PM

## 2013-09-15 DIAGNOSIS — F1020 Alcohol dependence, uncomplicated: Principal | ICD-10-CM

## 2013-09-15 DIAGNOSIS — F329 Major depressive disorder, single episode, unspecified: Secondary | ICD-10-CM

## 2013-09-15 MED ORDER — CHLORDIAZEPOXIDE HCL 25 MG PO CAPS
ORAL_CAPSULE | ORAL | Status: AC
  Administered 2013-09-15: 25 mg via ORAL
  Filled 2013-09-15: qty 1

## 2013-09-15 MED ORDER — NEBIVOLOL HCL 5 MG PO TABS: 5.0000 mg | ORAL_TABLET | Freq: Every day | ORAL | Status: DC

## 2013-09-15 MED ORDER — ESCITALOPRAM OXALATE 20 MG PO TABS: 20.0000 mg | ORAL_TABLET | Freq: Every day | ORAL | Status: DC

## 2013-09-15 MED ORDER — OMEGA-3 FATTY ACIDS 1000 MG PO CAPS: 4.0000 g | ORAL_CAPSULE | Freq: Every day | ORAL | Status: DC

## 2013-09-15 MED ORDER — TRAZODONE HCL 50 MG PO TABS: 50.0000 mg | ORAL_TABLET | Freq: Every evening | ORAL | Status: DC | PRN

## 2013-09-15 MED ORDER — ACAMPROSATE CALCIUM 333 MG PO TBEC: 666.0000 mg | DELAYED_RELEASE_TABLET | Freq: Three times a day (TID) | ORAL | Status: DC

## 2013-09-15 MED ORDER — EZETIMIBE 10 MG PO TABS: 10.0000 mg | ORAL_TABLET | Freq: Every day | ORAL | Status: DC

## 2013-09-15 MED ORDER — LISINOPRIL 5 MG PO TABS: 5.0000 mg | ORAL_TABLET | Freq: Every day | ORAL | Status: DC

## 2013-09-15 NOTE — [facility] Group Notes (Signed)
[facility] Group Notes:  (Nursing/MHT/Case Management/Adjunct)  Date:  09/15/2013  Time:  10:25 AM  Type of Therapy:  Psychoeducational Skills  Participation Level:  Active  Participation Quality:  Appropriate  Affect:  Anxious  Cognitive:  Alert  Insight:  Appropriate  Engagement in Group:  Engaged  Modes of Intervention:  Discussion, Education and Exploration  Summary of Progress/Problems: Self inventory review with RN and review of healthy coping skills packet. Pt attended and did engage in group discussion. Malva Limes 09/15/2013, 10:25 AM

## 2013-09-15 NOTE — Progress Notes (Signed)
D   Pt is pleasant and cooperative  She is compliant with treatment and interacts well with others   She reports sleeping well and having a good appetite she reports having normal energy and her ability to pay attention is improving   Her depression level is at a 3 and hopelessness at a 3   She is scheduled for discharge today around noon and said she is ready to go   She plans to make lifestyle changes to better take care of herself A   Verbal support given   Medications administered and effectiveness monitored  Q 15 min checks  Will provide follow up information upon discharge R   Pt safe at present

## 2013-09-15 NOTE — Progress Notes (Signed)
Pt was discharged left with all belongings and follow up information   She denies suicidal ideation and homicidal ideation  Her mood afftect and behavior are within normal limits for this pt   She verbalized understanding of discharge instructions

## 2013-09-15 NOTE — Progress Notes (Signed)
D: Pt denies SI/HI/AVH. Pt is pleasant and cooperative. Pt states she is just ready to go home.   A: Pt was offered support and encouragement. Pt was given scheduled medications. Pt was encourage to attend groups. Q 15 minute checks were done for safety.   R:Pt attends groups and interacts well with peers and staff. Pt is taking medication.Pt receptive to treatment and safety maintained on unit.

## 2013-09-15 NOTE — Discharge Summary (Signed)
Physician Discharge Summary Note  Patient:  [name] is an 67 y.o., female MRN:  [id number] DOB:  1946/04/13 Patient phone:  [phone] (home)  Patient address:   [address] [facility] [place] [address],   Date of Admission:  09/11/2013 Date of Discharge: 09/15/13  Reason for Admission:  Alcohol detox  Discharge Diagnoses: Active Problems:   * No active hospital problems. *  Review of Systems  Constitutional: Negative.   HENT: Negative.   Eyes: Negative.   Respiratory: Negative.   Cardiovascular: Negative.   Gastrointestinal: Negative.   Genitourinary: Negative.   Musculoskeletal: Negative.   Skin: Negative.   Neurological: Negative.   Endo/Heme/Allergies: Negative.   Psychiatric/Behavioral: Positive for depression (Stabilized with medication prior to discharge) and substance abuse (Alcoholism). Negative for suicidal ideas and hallucinations. The patient is nervous/anxious (Stabilized with medication prior to discharge) and has insomnia (Stabilized with medication prior to discharge).    DSM5: Schizophrenia Disorders:  NA Obsessive-Compulsive Disorders:  NA Trauma-Stressor Disorders:  Anxiety disorder Substance/Addictive Disorders:  Alcohol dependence Depressive Disorders:  Major depressive disorder,  Axis Diagnosis:   AXIS I:  Alcohol dependence, Major depression AXIS II:  Deferred AXIS III:   Past Medical History  Diagnosis Date  . Alcohol abuse   . Diabetes mellitus   . Hypertension   . Fibromyalgia   . Depression   . Carotid artery stenosis    AXIS IV:  other psychosocial or environmental problems and Chronic alcoholism AXIS V:  64  Level of Care:  OP  Hospital Course:  Another spiral of relapsing since December. She drinks for few weeks, then stops. She was drinking 8 to 12 mall bottles of wine. Last December husband of 26 years walked out. Holidays happened then found out significant amount of money was going to another woman. States she has  been dealing with anger, loss.  Upon admission into this hospital, and after admission assessment/evaluation coupled with toxicology/UDS reports indicating blood alcohol levels of 280, and patient actively presenting withdrawal symptoms, it was determined that patient will need detoxification treatment to stabilize her systems of alcohol intoxication and to combat the withdrawal symptoms as well. And her discharge plans included an appointment to an outpatient psychiatric clinic for continuation of her psychiatric/substance abuse treatment. [name] was then started on Librium detoxification treatrment protocols. She was also enrolled in group counseling sessions and activities where she was counseled, taught and learned coping skills that should help her after discharge to cope better, manage her substance abuse problems to maintain a much longer sobriety. She also was enrolled and attended AA/NA meetings being offered and held on this unit. [name] has some other previously existing and or identifiable medical conditions that required treatment and or monitoring. She received medication management for all those health issues as well. She was monitored closely for any potential problems that may arise as a result of and or during detoxification treatment. Patient tolerated her treatment regimen and detoxification treatments without any significant adverse effects and or reactions reported.  Besides the detoxification treatment protocols, [name] also received medication management for her chronic depression depressive mood/alcohol dependence. She was ordered and received Lexapro 20 mg daily for depression, Acamprosate 666 mg tid for alcohol dependence and Trazodone 50 mg Q bedtime for sleep. Patient attended treatment team meeting this am and met with the treatment team staff. Her reason for admission, present symptoms, substance abuse issues, response to treatment and discharge plans discussed. Patient endorsed  that she is doing well and  stable for discharge to pursue the next phase of her substance abuse treatment.  It was agreed upon between patient and the team that he will be discharged to follow-up care with Dr. Evelene Croon here in [place], [place]  for further substance abuse treatment/routine psychiatric care. [name] did state that she will call Dr. Carie Caddy clinic and make her own follow-up appointment. Upon discharge, patient adamantly denies suicidal, homicidal ideations, auditory, visual hallucinations, delusional thoughts and or withdrawal symptoms. Patient left [facility] with all personal belongings in no apparent distress. She received 4 days worth supply samples of her [facility] discharge medications. Transportation per family.  Consults:  psychiatry  Significant Diagnostic Studies:  labs: CBC with diff, CMP, UDS, Toxicology tests, N/A  Discharge Vitals:   Blood pressure 142/82, pulse 75, temperature 97.6 F (36.4 C), temperature source Oral, resp. rate 16, height 4' 11.84" (1.52 m), weight 71.215 kg (157 lb), SpO2 96.00%. Body mass index is 30.82 kg/(m^2). Lab Results:   No results found for this or any previous visit (from the past 72 hour(s)).  Physical Findings: AIMS: Facial and Oral Movements Muscles of Facial Expression: None, normal Lips and Perioral Area: None, normal Jaw: None, normal Tongue: None, normal,Extremity Movements Upper (arms, wrists, hands, fingers): None, normal Lower (legs, knees, ankles, toes): None, normal, Trunk Movements Neck, shoulders, hips: None, normal, Overall Severity Severity of abnormal movements (highest score from questions above): None, normal Incapacitation due to abnormal movements: None, normal Patient's awareness of abnormal movements (rate only patient's report): No Awareness, Dental Status Current problems with teeth and/or dentures?: No Does patient usually wear dentures?: No  CIWA:  CIWA-Ar Total: 0 COWS:     Psychiatric Specialty Exam: See Psychiatric  Specialty Exam and Suicide Risk Assessment completed by Attending Physician prior to discharge.  Discharge destination:  Home  Is patient on multiple antipsychotic therapies at discharge:  No   Has Patient had three or more failed trials of antipsychotic monotherapy by history:  No  Recommended Plan for Multiple Antipsychotic Therapies: NA     Medication List    STOP taking these medications       amphetamine-dextroamphetamine 30 MG tablet  Commonly known as:  ADDERALL      TAKE these medications     Indication   acamprosate 333 MG tablet  Commonly known as:  CAMPRAL  Take 2 tablets (666 mg total) by mouth 3 (three) times daily. For alcoholism   Indication:  Excessive Use of Alcohol     escitalopram 20 MG tablet  Commonly known as:  LEXAPRO  Take 1 tablet (20 mg total) by mouth daily. For depression   Indication:  Depression, Generalized Anxiety Disorder     ezetimibe 10 MG tablet  Commonly known as:  ZETIA  Take 1 tablet (10 mg total) by mouth daily. For cholesterol control   Indication:  Inherited Heterozygous Hypercholesterolemia, Inherited Homozygous Hypercholesterolemia, Elevation of Both Cholesterol and Triglycerides in Blood     fish oil-omega-3 fatty acids 1000 MG capsule  Take 4 capsules (4 g total) by mouth daily. For heart problem   Indication:  Disease involving Cholesterol Deposits in the Arteries     lisinopril 5 MG tablet  Commonly known as:  PRINIVIL,ZESTRIL  Take 1 tablet (5 mg total) by mouth daily. For hypertension   Indication:  High Blood Pressure     nebivolol 5 MG tablet  Commonly known as:  BYSTOLIC  Take 1 tablet (5 mg total) by mouth daily. For hypertension   Indication:  High  Blood Pressure     traZODone 50 MG tablet  Commonly known as:  DESYREL  Take 1 tablet (50 mg total) by mouth at bedtime as needed and may repeat dose one time if needed for sleep. For sleep   Indication:  Trouble Sleeping       Follow-up Information   Call  [facility]. (Patient verbalizes plan to make own follow up arrangements with Dr. Evelene Croon.  Patient did not want to sign consent to send records. )    Contact information:   Call [phone] to schedule appointment with Dr. Evelene Croon.      Follow up with 90 Alcoholics Anonymous Meetings in the next 90 days.   Contact information:   Return to women's group at [facility].  Ask for help with a ride to more meetings.  Get a new sponsor.     Follow-up recommendations: Activity:  As tolerated Diet: As recommended by your primary care doctor. Keep all scheduled follow-up appointments as recommended.   Comments:  Take all your medications as prescribed by your mental healthcare provider. Report any adverse effects and or reactions from your medicines to your outpatient provider promptly. Patient is instructed and cautioned to not engage in alcohol and or illegal drug use while on prescription medicines. In the event of worsening symptoms, patient is instructed to call the crisis hotline, 911 and or go to the nearest ED for appropriate evaluation and treatment of symptoms. Follow-up with your primary care provider for your other medical issues, concerns and or health care needs.   Total Discharge Time:  Greater than 30 minutes.  Signed: Armandina Stammer I, PMHNP-BC 09/16/2013, 10:07 AM

## 2013-09-15 NOTE — [facility] Group Notes (Signed)
[facility] Group Notes:  (Clinical Social Work)  09/15/2013     10-11AM  Summary of Progress/Problems:   The main focus of today's process group was for the patient to identify ways in which they have in the past sabotaged their own recovery. Motivational Interviewing was utilized to ask the group members what they get out of their substance use, and what reasons they may have for wanting to change.  The Stages of Change were explained using a handout, and patients identified where they currently are with regard to stages of change.  The patient expressed that she self sabotages by stopping going to meetings, isolating herself, not calling her supports, getting herself fired by her sponsor, and feeling sorry for herself.  She has had several years of sobriety prior to current episode.  She uses as an excuse for not attending meetings that she has lost her driver's license, but state soon-to-be-ex-husband is happy to take her.  She is in Preparation Stage of Change.  Type of Therapy:  Group Therapy - Process   Participation Level:  Active  Participation Quality:  Attentive and Sharing  Affect:  Blunted and Depressed  Cognitive:  Oriented  Insight:  Developing/Improving  Engagement in Therapy:  Engaged  Modes of Intervention:  Education, Support and Processing, Motivational Interviewing  Ambrose Mantle, LCSW 09/15/2013, 1:04 PM

## 2013-09-15 NOTE — [facility] Suicide Risk Assessment (Signed)
Suicide Risk Assessment  Discharge Assessment     Demographic Factors:  Age 65 or older and Caucasian  Mental Status Per Nursing Assessment::   On Admission:  NA  Current Mental Status by Physician: GA: AAOx3, no active w/d. mood: "a little anxious, since I am going through a divorce", affect: euthymic, full range, TC: Pt denies SI/HI/AVH. I/J: fair.  Loss Factors: Loss of significant relationship  Historical Factors: pt attempted suicide 30 years ago. pt reports she was intoxicated at the time, and was seeking attention.  Risk Reduction Factors:   Sense of responsibility to family, Living with another person, especially a relative and Positive social support  Continued Clinical Symptoms:  Alcohol/Substance Abuse/Dependencies  Cognitive Features That Contribute To Risk:  Thought constriction (tunnel vision)    Suicide Risk:  Minimal: No identifiable suicidal ideation.  Patients presenting with no risk factors but with morbid ruminations; may be classified as minimal risk based on the severity of the depressive symptoms  Discharge Diagnoses:   AXIS I:  alcohol dependence AXIS II:  No diagnosis AXIS III:   Past Medical History  Diagnosis Date  . Alcohol abuse   . Diabetes mellitus   . Hypertension   . Fibromyalgia   . Depression   . Carotid artery stenosis    AXIS IV:  problems related to social environment AXIS V:  61-70 mild symptoms  Plan Of Care/Follow-up recommendations:  Other:  follow up with AA. needs to obtain sponsor.  Is patient on multiple antipsychotic therapies at discharge:  No   Has Patient had three or more failed trials of antipsychotic monotherapy by history:  No  Recommended Plan for Multiple Antipsychotic Therapies: NA  [name] 09/15/2013, 10:01 AM

## 2013-09-15 NOTE — Progress Notes (Signed)
[facility] Adult Case Management Discharge Plan :  Will you be returning to the same living situation after discharge: Yes,  with daughter At discharge, do you have transportation home?:Yes,  family to pick up Do you have the ability to pay for your medications:Yes,  no issues  Release of information consent forms completed and in the chart;  Patient's signature needed at discharge.  Patient to Follow up at: Follow-up Information   Call [facility]. (Patient verbalizes plan to make own follow up arrangements with Dr. Evelene Croon.  Patient did not want to sign consent to send records. )    Contact information:   Call [phone] to schedule appointment with Dr. Evelene Croon.      Follow up with 90 Alcoholics Anonymous Meetings in the next 90 days.   Contact information:   Return to women's group at [facility].  Ask for help with a ride to more meetings.  Get a new sponsor.      Patient denies SI/HI:   Yes,  denies    Aeronautical engineer and Suicide Prevention discussed:  Yes,  with weekday CSW  Sarina Ser 09/15/2013, 11:14 AM

## 2013-09-16 NOTE — Discharge Summary (Signed)
Discharge summary reviewed, and I concur with the discharge plan.

## 2013-09-19 NOTE — Progress Notes (Addendum)
Patient Discharge Instructions:  No documentation was sent to [facility]. The patient refused to sign the ROI.  Jerelene Redden, 09/19/2013, 3:16 PM

## 2013-11-28 ENCOUNTER — Ambulatory Visit (INDEPENDENT_AMBULATORY_CARE_PROVIDER_SITE_OTHER): Payer: 59 | Admitting: Family Medicine

## 2013-11-28 VITALS — BP 122/78 | HR 89 | Temp 98.3°F | Resp 16 | Ht 59.0 in | Wt 160.2 lb

## 2013-11-28 DIAGNOSIS — K529 Noninfective gastroenteritis and colitis, unspecified: Secondary | ICD-10-CM

## 2013-11-28 DIAGNOSIS — Z23 Encounter for immunization: Secondary | ICD-10-CM

## 2013-11-28 DIAGNOSIS — Z789 Other specified health status: Secondary | ICD-10-CM

## 2013-11-28 DIAGNOSIS — K5289 Other specified noninfective gastroenteritis and colitis: Secondary | ICD-10-CM

## 2013-11-28 DIAGNOSIS — F40243 Fear of flying: Secondary | ICD-10-CM

## 2013-11-28 DIAGNOSIS — Z87892 Personal history of anaphylaxis: Secondary | ICD-10-CM

## 2013-11-28 DIAGNOSIS — F40298 Other specified phobia: Secondary | ICD-10-CM

## 2013-11-28 MED ORDER — LORAZEPAM 2 MG PO TABS: ORAL_TABLET | ORAL | Status: DC

## 2013-11-28 MED ORDER — TYPHOID VACCINE PO CPDR: 1.0000 | DELAYED_RELEASE_CAPSULE | ORAL | Status: DC

## 2013-11-28 MED ORDER — DIPHENOXYLATE-ATROPINE 2.5-0.025 MG PO TABS: 1.0000 | ORAL_TABLET | Freq: Four times a day (QID) | ORAL | Status: DC | PRN

## 2013-11-28 MED ORDER — EPINEPHRINE 0.3 MG/0.3ML IJ SOAJ: 0.3000 mg | Freq: Once | INTRAMUSCULAR | Status: DC

## 2013-11-28 NOTE — Progress Notes (Signed)
[facility] [address] [place] [address] [phone]  Date:  11/28/2013   Name:  [name]   DOB:  04-17-1946   MRN:  [id number]  PCP:  Pcp Not In System    Chief Complaint: Immunizations   History of Present Illness:  [name] is a 67 y.o. very pleasant female patient who presents with the following:  She is traveling to [place] in a week or so.  She is overdue for her tetanus booster so we will give a tdap today.  She has looked at the [facility] website.  Thinks that she needs typhoid vaccine but not malaira prophylaxis.   She is UTD on the hep B series.   She has a history of colitis and likes to have some lomotil on hand for her trip.  Also request ativan for fear of flying She is followed by [facility] for her DM.  She has recently stopped drinking alcohol and is taking campral to help her stay off alcohol.   Flu vaccine is done for this year  Patient Active Problem List   Diagnosis Date Noted  . Psychoactive substance-induced organic mood disorder 05/29/2012    Class: Acute  . Alcohol abuse, continuous 05/29/2012    Class: Acute  . Alcohol dependence with physiological dependence 05/29/2012    Class: Acute  . Alcohol abuse 08/21/2011  . DIABETES MELLITUS, TYPE II 08/20/2007  . HYPERLIPIDEMIA 08/20/2007  . ADIPOSITY, LOCALIZED 08/20/2007  . ANXIETY 08/20/2007  . ALCOHOLISM 08/20/2007  . DEPRESSION 08/20/2007  . HYPERTENSION 08/20/2007  . ALLERGIC RHINITIS 08/20/2007    Past Medical History  Diagnosis Date  . Alcohol abuse   . Diabetes mellitus   . Hypertension   . Fibromyalgia   . Depression   . Carotid artery stenosis     Past Surgical History  Procedure Laterality Date  . Cesarean section    . Tonsillectomy    . Bunionectomy      History  Substance Use Topics  . Smoking status: Former Smoker    Quit date: 03/11/2010  . Smokeless tobacco: Never Used  . Alcohol Use: Yes     Comment: 4-5 bottles of wine per day     Family History  Problem Relation Age of Onset  . Cancer Mother   . Heart disease Father   . Heart disease Sister     Allergies  Allergen Reactions  . Other Anaphylaxis    shellfish  . Latex Other (See Comments)    Break outs  . Lipitor [Atorvastatin Calcium] Other (See Comments)    Muscle aches  . Shellfish Allergy   . Codeine Rash    And nausea   . Pentazocine Lactate Rash  . Sulfonamide Derivatives Rash    Medication list has been reviewed and updated.  Current Outpatient Prescriptions on File Prior to Visit  Medication Sig Dispense Refill  . acamprosate (CAMPRAL) 333 MG tablet Take 2 tablets (666 mg total) by mouth 3 (three) times daily. For alcoholism  180 tablet  0  . escitalopram (LEXAPRO) 20 MG tablet Take 1 tablet (20 mg total) by mouth daily. For depression  30 tablet  0  . ezetimibe (ZETIA) 10 MG tablet Take 1 tablet (10 mg total) by mouth daily. For cholesterol control      . fish oil-omega-3 fatty acids 1000 MG capsule Take 4 capsules (4 g total) by mouth daily. For heart problem      . lisinopril (PRINIVIL,ZESTRIL) 5  MG tablet Take 1 tablet (5 mg total) by mouth daily. For hypertension      . nebivolol (BYSTOLIC) 5 MG tablet Take 1 tablet (5 mg total) by mouth daily. For hypertension  30 tablet    . traZODone (DESYREL) 50 MG tablet Take 1 tablet (50 mg total) by mouth at bedtime as needed and may repeat dose one time if needed for sleep. For sleep  60 tablet  0   No current facility-administered medications on file prior to visit.    Review of Systems:  As per HPI- otherwise negative.   Physical Examination: Filed Vitals:   11/28/13 1417  BP: 122/78  Pulse: 89  Temp: 98.3 F (36.8 C)  Resp: 16   Filed Vitals:   11/28/13 1417  Height: 4\' 11"  (1.499 m)  Weight: 160 lb 3.2 oz (72.666 kg)   Body mass index is 32.34 kg/(m^2). Ideal Body Weight: Weight in (lb) to have BMI = 25: 123.5  GEN: WDWN, NAD, Non-toxic, A & O x 3 HEENT: Atraumatic,  Normocephalic. Neck supple. No masses, No LAD. Ears and Nose: No external deformity. CV: RRR, No M/G/R. No JVD. No thrill. No extra heart sounds. PULM: CTA B, no wheezes, crackles, rhonchi. No retractions. No resp. distress. No accessory muscle use. ABD: S, NT, ND, +BS. No rebound. No HSM. EXTR: No c/c/e NEURO Normal gait.  PSYCH: Normally interactive. Conversant. Not depressed or anxious appearing.  Calm demeanor.    Assessment and Plan: History of anaphylaxis - Plan: EPINEPHrine (EPIPEN) 0.3 mg/0.3 mL SOAJ injection  Colitis - Plan: diphenoxylate-atropine (LOMOTIL) 2.5-0.025 MG per tablet  Immunization due  Foreign travel - Plan: Tdap vaccine greater than or equal to 7yo IM, typhoid (VIVOTIF BERNA VACCINE) DR capsule  Fear of flying - Plan: LORazepam (ATIVAN) 2 MG tablet  Medications as above for her trip.  She declines Hep A vaccine.  Tdap, typhoid oral vaccine.   Cautioned her regarding ativan use as she is a recovering alcoholic.   Signed [name], MD

## 2013-11-28 NOTE — Patient Instructions (Signed)
You received your Tdap today- you will be due for a Td in 10 years.   Take the typhoid oral vaccine- one pill every other day for 4 doses   Ask the PharmD about the new epinephrine kit you saw on TV; I am not sure if this is widely available as of yet.   Use the lomotil if needed.  Use the ativan as needed for anxiety on the plane, but use with caution

## 2014-03-04 ENCOUNTER — Other Ambulatory Visit: Payer: Self-pay | Admitting: Family Medicine

## 2014-03-04 DIAGNOSIS — F40243 Fear of flying: Secondary | ICD-10-CM

## 2014-03-26 ENCOUNTER — Other Ambulatory Visit: Payer: Self-pay | Admitting: Family Medicine

## 2014-03-26 DIAGNOSIS — K529 Noninfective gastroenteritis and colitis, unspecified: Secondary | ICD-10-CM

## 2014-04-02 ENCOUNTER — Encounter (HOSPITAL_COMMUNITY): Payer: Self-pay | Admitting: *Deleted

## 2014-04-02 ENCOUNTER — Encounter (HOSPITAL_COMMUNITY): Payer: Self-pay | Admitting: Emergency Medicine

## 2014-04-02 ENCOUNTER — Emergency Department (HOSPITAL_COMMUNITY)
Admission: EM | Admit: 2014-04-02 | Discharge: 2014-04-02 | Disposition: A | Discharge status: Other Acute Inpatient Hospital | Payer: 59 | Attending: Emergency Medicine | Admitting: Emergency Medicine

## 2014-04-02 ENCOUNTER — Inpatient Hospital Stay (HOSPITAL_COMMUNITY)
Admission: EM | Admit: 2014-04-02 | Discharge: 2014-04-06 | DRG: 897 | Disposition: A | Discharge status: Home / Self Care | Payer: 59 | Source: Intra-hospital | Attending: Psychiatry | Admitting: Psychiatry

## 2014-04-02 DIAGNOSIS — F1020 Alcohol dependence, uncomplicated: Secondary | ICD-10-CM

## 2014-04-02 DIAGNOSIS — IMO0002 Reserved for concepts with insufficient information to code with codable children: Secondary | ICD-10-CM | POA: Insufficient documentation

## 2014-04-02 DIAGNOSIS — F411 Generalized anxiety disorder: Secondary | ICD-10-CM | POA: Diagnosis present

## 2014-04-02 DIAGNOSIS — IMO0001 Reserved for inherently not codable concepts without codable children: Secondary | ICD-10-CM | POA: Diagnosis present

## 2014-04-02 DIAGNOSIS — F32A Depression, unspecified: Secondary | ICD-10-CM

## 2014-04-02 DIAGNOSIS — Z598 Other problems related to housing and economic circumstances: Secondary | ICD-10-CM

## 2014-04-02 DIAGNOSIS — F1010 Alcohol abuse, uncomplicated: Secondary | ICD-10-CM

## 2014-04-02 DIAGNOSIS — Z23 Encounter for immunization: Secondary | ICD-10-CM

## 2014-04-02 DIAGNOSIS — F1994 Other psychoactive substance use, unspecified with psychoactive substance-induced mood disorder: Secondary | ICD-10-CM | POA: Diagnosis present

## 2014-04-02 DIAGNOSIS — F1094 Alcohol use, unspecified with alcohol-induced mood disorder: Secondary | ICD-10-CM

## 2014-04-02 DIAGNOSIS — F329 Major depressive disorder, single episode, unspecified: Secondary | ICD-10-CM | POA: Insufficient documentation

## 2014-04-02 DIAGNOSIS — F3289 Other specified depressive episodes: Secondary | ICD-10-CM | POA: Insufficient documentation

## 2014-04-02 DIAGNOSIS — F332 Major depressive disorder, recurrent severe without psychotic features: Secondary | ICD-10-CM | POA: Diagnosis present

## 2014-04-02 DIAGNOSIS — Z5989 Other problems related to housing and economic circumstances: Secondary | ICD-10-CM

## 2014-04-02 DIAGNOSIS — Z91040 Latex allergy status: Secondary | ICD-10-CM | POA: Insufficient documentation

## 2014-04-02 DIAGNOSIS — E119 Type 2 diabetes mellitus without complications: Secondary | ICD-10-CM | POA: Diagnosis present

## 2014-04-02 DIAGNOSIS — K529 Noninfective gastroenteritis and colitis, unspecified: Secondary | ICD-10-CM

## 2014-04-02 DIAGNOSIS — I10 Essential (primary) hypertension: Secondary | ICD-10-CM | POA: Diagnosis present

## 2014-04-02 DIAGNOSIS — Z79899 Other long term (current) drug therapy: Secondary | ICD-10-CM | POA: Insufficient documentation

## 2014-04-02 DIAGNOSIS — Z87891 Personal history of nicotine dependence: Secondary | ICD-10-CM

## 2014-04-02 DIAGNOSIS — G4700 Insomnia, unspecified: Secondary | ICD-10-CM | POA: Diagnosis present

## 2014-04-02 DIAGNOSIS — Z5987 Material hardship due to limited financial resources, not elsewhere classified: Secondary | ICD-10-CM

## 2014-04-02 DIAGNOSIS — Z8249 Family history of ischemic heart disease and other diseases of the circulatory system: Secondary | ICD-10-CM

## 2014-04-02 LAB — CBC
HCT: 40.2 % (ref 36.0–46.0)
Hemoglobin: 14 g/dL (ref 12.0–15.0)
MCH: 32.5 pg (ref 26.0–34.0)
MCHC: 34.8 g/dL (ref 30.0–36.0)
MCV: 93.3 fL (ref 78.0–100.0)
Platelets: 210 10*3/uL (ref 150–400)
RBC: 4.31 MIL/uL (ref 3.87–5.11)
RDW: 14.5 % (ref 11.5–15.5)
WBC: 3.7 10*3/uL — ABNORMAL LOW (ref 4.0–10.5)

## 2014-04-02 LAB — COMPREHENSIVE METABOLIC PANEL
ALT: 20 U/L (ref 0–35)
AST: 39 U/L — ABNORMAL HIGH (ref 0–37)
Albumin: 3.8 g/dL (ref 3.5–5.2)
Alkaline Phosphatase: 112 U/L (ref 39–117)
BUN: 12 mg/dL (ref 6–23)
CO2: 21 mEq/L (ref 19–32)
Calcium: 8.5 mg/dL (ref 8.4–10.5)
Chloride: 89 mEq/L — ABNORMAL LOW (ref 96–112)
Creatinine, Ser: 0.58 mg/dL (ref 0.50–1.10)
GFR calc Af Amer: >90 mL/min (ref >90)
GFR calc non Af Amer: >90 mL/min (ref >90)
Glucose, Bld: 312 mg/dL — ABNORMAL HIGH (ref 70–99)
Potassium: 3.9 mEq/L (ref 3.7–5.3)
Sodium: 136 mEq/L — ABNORMAL LOW (ref 137–147)
Total Bilirubin: 0.4 mg/dL (ref 0.3–1.2)
Total Protein: 7.1 g/dL (ref 6.0–8.3)

## 2014-04-02 LAB — RAPID URINE DRUG SCREEN, HOSP PERFORMED
Amphetamines: NOT DETECTED
Barbiturates: NOT DETECTED
Benzodiazepines: NOT DETECTED
Cocaine: NOT DETECTED
Opiates: NOT DETECTED
Tetrahydrocannabinol: NOT DETECTED

## 2014-04-02 LAB — CBG MONITORING, ED
Glucose-Capillary: 326 mg/dL — ABNORMAL HIGH (ref 70–99)
Glucose-Capillary: 223 mg/dL — ABNORMAL HIGH (ref 70–99)
Glucose-Capillary: 207 mg/dL — ABNORMAL HIGH (ref 70–99)

## 2014-04-02 LAB — ETHANOL: Alcohol, Ethyl (B): 356 mg/dL — ABNORMAL HIGH (ref 0–11)

## 2014-04-02 MED ORDER — ACETAMINOPHEN 325 MG PO TABS: 650.0000 mg | ORAL_TABLET | ORAL | Status: DC | PRN

## 2014-04-02 MED ORDER — ONDANSETRON HCL 4 MG PO TABS
4.0000 mg | ORAL_TABLET | Freq: Three times a day (TID) | ORAL | Status: DC | PRN
  Administered 2014-04-02: 4 mg via ORAL
  Filled 2014-04-02: qty 1

## 2014-04-02 MED ORDER — SODIUM CHLORIDE 0.9 % IV BOLUS (SEPSIS)
1000.0000 mL | Freq: Once | INTRAVENOUS | Status: AC
  Administered 2014-04-02: 1000 mL via INTRAVENOUS

## 2014-04-02 MED ORDER — LORAZEPAM 2 MG/ML IJ SOLN
1.0000 mg | Freq: Once | INTRAMUSCULAR | Status: AC
  Administered 2014-04-02: 1 mg via INTRAVENOUS
  Filled 2014-04-02: qty 1

## 2014-04-02 MED ORDER — LORAZEPAM 1 MG PO TABS
1.0000 mg | ORAL_TABLET | Freq: Once | ORAL | Status: AC
  Administered 2014-04-02: 1 mg via ORAL
  Filled 2014-04-02: qty 1

## 2014-04-02 MED ORDER — ESCITALOPRAM OXALATE 10 MG PO TABS
20.0000 mg | ORAL_TABLET | Freq: Every day | ORAL | Status: DC
  Administered 2014-04-02: 20 mg via ORAL
  Filled 2014-04-02: qty 2

## 2014-04-02 MED ORDER — ALUM & MAG HYDROXIDE-SIMETH 200-200-20 MG/5ML PO SUSP: 30.0000 mL | ORAL | Status: DC | PRN

## 2014-04-02 MED ORDER — LISINOPRIL 5 MG PO TABS
5.0000 mg | ORAL_TABLET | Freq: Every day | ORAL | Status: DC
  Administered 2014-04-03 – 2014-04-06 (×4): 5 mg via ORAL
  Filled 2014-04-02 (×7): qty 1

## 2014-04-02 MED ORDER — MAGNESIUM HYDROXIDE 400 MG/5ML PO SUSP: 30.0000 mL | Freq: Every day | ORAL | Status: DC | PRN

## 2014-04-02 MED ORDER — CHLORDIAZEPOXIDE HCL 25 MG PO CAPS
25.0000 mg | ORAL_CAPSULE | Freq: Three times a day (TID) | ORAL | Status: AC
  Administered 2014-04-04 – 2014-04-05 (×3): 25 mg via ORAL
  Filled 2014-04-02 (×4): qty 1

## 2014-04-02 MED ORDER — THIAMINE HCL 100 MG/ML IJ SOLN
100.0000 mg | Freq: Once | INTRAMUSCULAR | Status: DC
  Filled 2014-04-02: qty 2

## 2014-04-02 MED ORDER — HALOPERIDOL 1 MG PO TABS: 2.0000 mg | ORAL_TABLET | Freq: Four times a day (QID) | ORAL | Status: DC | PRN

## 2014-04-02 MED ORDER — CHLORDIAZEPOXIDE HCL 25 MG PO CAPS
25.0000 mg | ORAL_CAPSULE | Freq: Four times a day (QID) | ORAL | Status: DC | PRN
  Administered 2014-04-03 – 2014-04-04 (×2): 25 mg via ORAL
  Filled 2014-04-02 (×2): qty 1

## 2014-04-02 MED ORDER — CHLORDIAZEPOXIDE HCL 25 MG PO CAPS
25.0000 mg | ORAL_CAPSULE | ORAL | Status: AC
  Administered 2014-04-05 – 2014-04-06 (×2): 25 mg via ORAL
  Filled 2014-04-02 (×2): qty 1

## 2014-04-02 MED ORDER — ACETAMINOPHEN 325 MG PO TABS: 650.0000 mg | ORAL_TABLET | Freq: Four times a day (QID) | ORAL | Status: DC | PRN

## 2014-04-02 MED ORDER — VITAMIN B-1 100 MG PO TABS
100.0000 mg | ORAL_TABLET | Freq: Every day | ORAL | Status: DC
  Administered 2014-04-02: 100 mg via ORAL
  Filled 2014-04-02: qty 1

## 2014-04-02 MED ORDER — ADULT MULTIVITAMIN W/MINERALS CH
1.0000 | ORAL_TABLET | Freq: Every day | ORAL | Status: DC
  Administered 2014-04-03 – 2014-04-06 (×4): 1 via ORAL
  Filled 2014-04-02 (×7): qty 1

## 2014-04-02 MED ORDER — VITAMIN B-1 100 MG PO TABS
100.0000 mg | ORAL_TABLET | Freq: Every day | ORAL | Status: DC
  Administered 2014-04-03 – 2014-04-06 (×4): 100 mg via ORAL
  Filled 2014-04-02 (×7): qty 1

## 2014-04-02 MED ORDER — PANTOPRAZOLE SODIUM 40 MG IV SOLR
40.0000 mg | Freq: Once | INTRAVENOUS | Status: AC
  Administered 2014-04-02: 40 mg via INTRAVENOUS
  Filled 2014-04-02: qty 40

## 2014-04-02 MED ORDER — LORAZEPAM 1 MG PO TABS: 0.0000 mg | ORAL_TABLET | Freq: Two times a day (BID) | ORAL | Status: DC

## 2014-04-02 MED ORDER — LORAZEPAM 1 MG PO TABS
0.0000 mg | ORAL_TABLET | Freq: Four times a day (QID) | ORAL | Status: DC
  Administered 2014-04-02 (×2): 1 mg via ORAL
  Filled 2014-04-02 (×2): qty 1

## 2014-04-02 MED ORDER — CHLORDIAZEPOXIDE HCL 25 MG PO CAPS
50.0000 mg | ORAL_CAPSULE | Freq: Once | ORAL | Status: AC
  Administered 2014-04-03: 50 mg via ORAL
  Filled 2014-04-02: qty 2

## 2014-04-02 MED ORDER — CHLORDIAZEPOXIDE HCL 25 MG PO CAPS
25.0000 mg | ORAL_CAPSULE | Freq: Four times a day (QID) | ORAL | Status: AC
  Administered 2014-04-03 – 2014-04-04 (×5): 25 mg via ORAL
  Filled 2014-04-02 (×5): qty 1

## 2014-04-02 MED ORDER — INSULIN ASPART 100 UNIT/ML ~~LOC~~ SOLN
0.0000 [IU] | Freq: Every day | SUBCUTANEOUS | Status: DC
  Administered 2014-04-04 – 2014-04-05 (×2): 2 [IU] via SUBCUTANEOUS

## 2014-04-02 MED ORDER — INSULIN ASPART 100 UNIT/ML ~~LOC~~ SOLN
0.0000 [IU] | Freq: Three times a day (TID) | SUBCUTANEOUS | Status: DC
  Administered 2014-04-03 – 2014-04-04 (×3): 3 [IU] via SUBCUTANEOUS
  Administered 2014-04-04 (×2): 5 [IU] via SUBCUTANEOUS
  Administered 2014-04-05: 3 [IU] via SUBCUTANEOUS
  Administered 2014-04-05: 5 [IU] via SUBCUTANEOUS
  Administered 2014-04-06: 3 [IU] via SUBCUTANEOUS

## 2014-04-02 MED ORDER — HYDROXYZINE HCL 25 MG PO TABS
25.0000 mg | ORAL_TABLET | Freq: Four times a day (QID) | ORAL | Status: AC | PRN
  Administered 2014-04-03 – 2014-04-05 (×3): 25 mg via ORAL
  Filled 2014-04-02 (×3): qty 1

## 2014-04-02 MED ORDER — NEBIVOLOL HCL 5 MG PO TABS
5.0000 mg | ORAL_TABLET | Freq: Every day | ORAL | Status: DC
  Administered 2014-04-03 – 2014-04-06 (×4): 5 mg via ORAL
  Filled 2014-04-02 (×6): qty 1

## 2014-04-02 MED ORDER — ONDANSETRON 4 MG PO TBDP
4.0000 mg | ORAL_TABLET | Freq: Four times a day (QID) | ORAL | Status: AC | PRN
  Administered 2014-04-03 – 2014-04-04 (×2): 4 mg via ORAL
  Filled 2014-04-02 (×2): qty 1

## 2014-04-02 MED ORDER — THIAMINE HCL 100 MG/ML IJ SOLN: 100.0000 mg | Freq: Every day | INTRAMUSCULAR | Status: DC

## 2014-04-02 MED ORDER — CHLORDIAZEPOXIDE HCL 25 MG PO CAPS: 25.0000 mg | ORAL_CAPSULE | Freq: Every day | ORAL | Status: DC

## 2014-04-02 MED ORDER — DIPHENOXYLATE-ATROPINE 2.5-0.025 MG PO TABS
2.0000 | ORAL_TABLET | Freq: Four times a day (QID) | ORAL | Status: DC | PRN
  Administered 2014-04-03 (×3): 2 via ORAL
  Administered 2014-04-04: 1 via ORAL
  Administered 2014-04-05 (×2): 2 via ORAL
  Filled 2014-04-02 (×2): qty 2
  Filled 2014-04-02: qty 1
  Filled 2014-04-02 (×3): qty 2

## 2014-04-02 MED ORDER — EZETIMIBE 10 MG PO TABS: 10.0000 mg | ORAL_TABLET | Freq: Every day | ORAL | Status: DC

## 2014-04-02 MED ORDER — ESCITALOPRAM OXALATE 20 MG PO TABS
20.0000 mg | ORAL_TABLET | Freq: Every day | ORAL | Status: DC
  Administered 2014-04-03 – 2014-04-06 (×4): 20 mg via ORAL
  Filled 2014-04-02: qty 1
  Filled 2014-04-02: qty 2
  Filled 2014-04-02 (×5): qty 1

## 2014-04-02 MED ORDER — EZETIMIBE 10 MG PO TABS
10.0000 mg | ORAL_TABLET | Freq: Every day | ORAL | Status: DC
  Administered 2014-04-03 – 2014-04-06 (×4): 10 mg via ORAL
  Filled 2014-04-02 (×6): qty 1

## 2014-04-02 MED ORDER — TRAZODONE HCL 50 MG PO TABS
50.0000 mg | ORAL_TABLET | Freq: Every evening | ORAL | Status: DC | PRN
  Filled 2014-04-02 (×10): qty 1

## 2014-04-02 MED ORDER — OLANZAPINE 5 MG PO TABS
5.0000 mg | ORAL_TABLET | Freq: Every day | ORAL | Status: DC
  Administered 2014-04-02: 5 mg via ORAL
  Filled 2014-04-02: qty 1

## 2014-04-02 MED ORDER — LISINOPRIL 5 MG PO TABS
5.0000 mg | ORAL_TABLET | Freq: Every day | ORAL | Status: DC
  Administered 2014-04-02: 5 mg via ORAL
  Filled 2014-04-02 (×2): qty 1

## 2014-04-02 MED ORDER — NEBIVOLOL HCL 5 MG PO TABS
5.0000 mg | ORAL_TABLET | Freq: Every day | ORAL | Status: DC
  Administered 2014-04-02: 5 mg via ORAL
  Filled 2014-04-02 (×2): qty 1

## 2014-04-02 MED ORDER — METFORMIN HCL 500 MG PO TABS
500.0000 mg | ORAL_TABLET | Freq: Two times a day (BID) | ORAL | Status: DC
  Administered 2014-04-02: 500 mg via ORAL
  Filled 2014-04-02 (×2): qty 1

## 2014-04-02 MED ORDER — ONDANSETRON 8 MG PO TBDP
8.0000 mg | ORAL_TABLET | Freq: Once | ORAL | Status: AC
  Administered 2014-04-02: 8 mg via ORAL
  Filled 2014-04-02: qty 1

## 2014-04-02 NOTE — ED Notes (Signed)
Patient urinated, small amount of cloudy urine went into hat in commode. Urine sent down to lab.

## 2014-04-02 NOTE — ED Notes (Signed)
A&Ox4. Patient resting. Patient states "I am seeing things I shouldn't see" and that "I am starting to shake." Will make MD aware.

## 2014-04-02 NOTE — BH Assessment (Signed)
Patient accepted to [facility] by Heloise Purpura, NP. The attending physician is Dr. Carlton Adam. The room assignment is 304-1.

## 2014-04-02 NOTE — ED Notes (Signed)
A&O x4. Able to walk down hallway with steady gait. Receiving RN here to evaluate patient. PIV removed. Psych scrubs applied. Patient aware of transfer.

## 2014-04-02 NOTE — Progress Notes (Signed)
Inpatient Diabetes Program Recommendations  AACE/ADA: New Consensus Statement on Inpatient Glycemic Control (2013)  Target Ranges:  Prepandial:   less than 140 mg/dL      Peak postprandial:   less than 180 mg/dL (1-2 hours)      Critically ill patients:  140 - 180 mg/dL   Reason for Assessment: Hyperglycemia  Diabetes history: No hx Outpatient Diabetes medications: None Current orders for Inpatient glycemic control: metformin 500 mg bid  Results for VENESHA, PETRAITIS (MRN [id number]) as of 04/02/2014 16:13  Ref. Range 04/02/2014 15:35  Glucose-Capillary Latest Range: 70-99 mg/dL 326 (H)    Inpatient Diabetes Program Recommendations Correction (SSI): Consider addition of Novolog sensitive tidwc and hs HgbA1C: Check HgbA1C to assess glycemic control prior to hospitalization  Note: Will follow in am.  Thank you. Lorenda Peck, RD, LDN, CDE Inpatient Diabetes Coordinator [phone]

## 2014-04-02 NOTE — BH Assessment (Signed)
Assessment Note  [name] is an 68 y.o. female presenting to [facility] seeking detox from alcohol. Sts she has been on a 2 week alcohol binge. Reports drinking 3 small bottles of wine each day. Last drink was last night/this morning drinking drinking 3 large glasses of wine. She initially started drinking at age 58 and has struggled with alcoholism every since. Patient currently having tremors,vomiting, hot flashes, and sts that she started having DT's last night. She reports auditory hallucinations but unable to describe the content of the voices. No visual hallucinations. Patient has received detox and treatment at several facilities including [facility] (9/14,6/14,7/13,6/13,3/13, 9/12). Her last treatment hospitalizaiton was at [facility] last yr. Patient unable to recall the other facilities in which she has received treatment. No SI or HI. Patient however reports ongoing depression and worsening anxiety.   Axis I: Alcohol Dependence and Depressive Disorder Nos Axis II: Deferred Axis III:  Past Medical History  Diagnosis Date  . Alcohol abuse   . Diabetes mellitus   . Hypertension   . Fibromyalgia   . Depression   . Carotid artery stenosis    Axis IV: other psychosocial or environmental problems, problems related to social environment, problems with access to health care services and problems with primary support group Axis V: 31-40 impairment in reality testing  Past Medical History:  Past Medical History  Diagnosis Date  . Alcohol abuse   . Diabetes mellitus   . Hypertension   . Fibromyalgia   . Depression   . Carotid artery stenosis     Past Surgical History  Procedure Laterality Date  . Cesarean section    . Tonsillectomy    . Bunionectomy      Family History:  Family History  Problem Relation Age of Onset  . Cancer Mother   . Heart disease Father   . Heart disease Sister     Social History:  reports that she quit smoking about 4 years ago. She has never used  smokeless tobacco. She reports that she drinks alcohol. She reports that she does not use illicit drugs.  Additional Social History:  Alcohol / Drug Use Pain Medications: SEE MAR Prescriptions: SEE MAR Over the Counter: SEE MAR History of alcohol / drug use?: Yes Substance #1 Name of Substance 1: Alcohol  1 - Age of First Use: 68yrs old  1 - Amount (size/oz): "Up to 3 small bottles of wine per day" 1 - Frequency: daily  1 - Duration: on-going  1 - Last Use / Amount: last night; 3 glasses of wine  CIWA: CIWA-Ar BP: 136/66 mmHg Pulse Rate: 86 Nausea and Vomiting: no nausea and no vomiting Tactile Disturbances: none Tremor: moderate, with patient's arms extended Auditory Disturbances: very mild harshness or ability to frighten Paroxysmal Sweats: no sweat visible Visual Disturbances: mild sensitivity Anxiety: mildly anxious Headache, Fullness in Head: none present Agitation: normal activity Orientation and Clouding of Sensorium: oriented and can do serial additions CIWA-Ar Total: 8 COWS:    Allergies:  Allergies  Allergen Reactions  . Other Anaphylaxis    shellfish  . Latex Other (See Comments)    Break outs  . Lipitor [Atorvastatin Calcium] Other (See Comments)    Muscle aches  . Shellfish Allergy   . Codeine Rash    And nausea   . Pentazocine Lactate Rash  . Sulfonamide Derivatives Rash    Home Medications:  (Not in a hospital admission)  OB/GYN Status:  No LMP recorded. Patient is postmenopausal.  General  Assessment Data Location of Assessment: [facility] ED Is this a Tele or Face-to-Face Assessment?: Face-to-Face Is this an Initial Assessment or a Re-assessment for this encounter?: Initial Assessment Living Arrangements: Alone Can pt return to current living arrangement?: No Admission Status: Voluntary Is patient capable of signing voluntary admission?: Yes Transfer from: [facility] Referral Source: Self/Family/Friend     [place] [facility]  Arrangements: Alone Name of Psychiatrist:  (No psychiatrist ) Name of Therapist:  (No therapist )  Education Status Is patient currently in school?: No  Risk to self Suicidal Ideation: No Suicidal Intent: No Is patient at risk for suicide?: No Suicidal Plan?: No Access to Means: No What has been your use of drugs/alcohol within the last 12 months?:  (patient reports heavy alcohol use for the past 2 weeks ) Previous Attempts/Gestures: No How many times?:  (0) Other Self Harm Risks:  (n/a) Triggers for Past Attempts: Other (Comment) (no previous attempts or gestures ) Intentional Self Injurious Behavior: None Family Suicide History: No Recent stressful life event(s): Other (Comment) (no stressors identified ) Persecutory voices/beliefs?: No Depression: Yes Depression Symptoms: Loss of interest in usual pleasures;Fatigue;Isolating Substance abuse history and/or treatment for substance abuse?: No Suicide prevention information given to non-admitted patients: Not applicable  Risk to Others Homicidal Ideation: No Thoughts of Harm to Others: No Current Homicidal Intent: No Current Homicidal Plan: No Access to Homicidal Means: No Identified Victim:  (n/a) History of harm to others?: No Assessment of Violence: None Noted Violent Behavior Description:  (patient is calm and cooperative ) Does patient have access to weapons?: No Criminal Charges Pending?: No Does patient have a court date: No  Psychosis Hallucinations: Auditory (pt started hearing voices last night-unable to describe) Delusions: None noted  Mental Status Report Appear/Hygiene: Disheveled Eye Contact: Fair Motor Activity: Freedom of movement Speech: Logical/coherent Level of Consciousness: Alert Mood: Depressed Affect: Appropriate to circumstance Anxiety Level: None Thought Processes: Relevant;Coherent Judgement: Unimpaired Orientation: Person;Place;Situation;Time Obsessive Compulsive Thoughts/Behaviors:  None  Cognitive Functioning Concentration: Decreased Memory: Remote Intact;Recent Intact IQ: Average Insight: Fair Impulse Control: Poor Appetite: Poor Weight Loss:  (none reported ) Weight Gain:  (none reported ) Sleep: Decreased Total Hours of Sleep:  (varies-2 or 3 hrs per night ) Vegetative Symptoms: None  ADLScreening [facility] Assessment Services) Patient's cognitive ability adequate to safely complete daily activities?: Yes Patient able to express need for assistance with ADLs?: Yes Independently performs ADLs?: Yes (appropriate for developmental age)  Prior Inpatient Therapy Prior Inpatient Therapy: Yes Prior Therapy Dates:  ( [facility]-9/14, 6/14, 7/13,6/13, 3/13, 9/12; other facilities-unk) Prior Therapy Facilty/Provider(s):  [facility] and sts other facilities (does not recall names of other) Reason for Treatment:  (depression )  Prior Outpatient Therapy Prior Outpatient Therapy: Yes Prior Therapy Dates:  (current) Prior Therapy Facilty/Provider(s):  (Rupindaur Toy Care, MD) Reason for Treatment:  (depression )  ADL Screening (condition at time of admission) Patient's cognitive ability adequate to safely complete daily activities?: Yes Is the patient deaf or have difficulty hearing?: No Does the patient have difficulty seeing, even when wearing glasses/contacts?: No Does the patient have difficulty concentrating, remembering, or making decisions?: No Patient able to express need for assistance with ADLs?: Yes Does the patient have difficulty dressing or bathing?: No Independently performs ADLs?: Yes (appropriate for developmental age) Does the patient have difficulty walking or climbing stairs?: No Weakness of Legs: None Weakness of Arms/Hands: None  Home Assistive Devices/Equipment Home Assistive Devices/Equipment: None    Abuse/Neglect Assessment (Assessment to be complete while  patient is alone) Physical Abuse: Denies Verbal Abuse: Denies Sexual Abuse: Denies Exploitation  of patient/patient's resources: Denies Self-Neglect: Denies Values / Beliefs Cultural Requests During Hospitalization: None Spiritual Requests During Hospitalization: None   Advance Directives (For Healthcare) Advance Directive: Patient does not have advance directive Nutrition Screen- [place] [facility] Patient's home diet: Regular  Additional Information 1:1 In Past 12 Months?: No CIRT Risk: No Elopement Risk: No Does patient have medical clearance?: Yes     Disposition:  Disposition Initial Assessment Completed for this Encounter: Yes Disposition of Patient:  (Disposition pending (Will need to be ran by Heloise Purpura, NP))  On Site Evaluation by:   Reviewed with Physician:    Evangeline Gula 04/02/2014 6:50 PM

## 2014-04-02 NOTE — ED Provider Notes (Addendum)
CSN: [id number]     Arrival date & time 04/02/14  1049 History   First MD Initiated Contact with Patient 04/02/14 1057     Chief Complaint  Patient presents with  . Medical Clearance     (Consider location/radiation/quality/duration/timing/severity/associated sxs/prior Treatment) The history is provided by the patient.  pt c/o hx etoh abuse, and states has been drinking heavily in the past 2 weeks. Pt states last drank this morning after which she had episode nv. Emesis color of recently ingested liquid, not bloody or bilious. No abd distension. Having normal bms. No abd pain. States has been through detox programs in past and feels she needs detox/rehab. States when stops drinking will feel shaky at times, denies hx dts or seizures. Also notes feelings of anxiety and depression, states sees Dr Toy Care, and was prescribed ativan for those symptoms. States compliant w meds.     Past Medical History  Diagnosis Date  . Alcohol abuse   . Diabetes mellitus   . Hypertension   . Fibromyalgia   . Depression   . Carotid artery stenosis    Past Surgical History  Procedure Laterality Date  . Cesarean section    . Tonsillectomy    . Bunionectomy     Family History  Problem Relation Age of Onset  . Cancer Mother   . Heart disease Father   . Heart disease Sister    History  Substance Use Topics  . Smoking status: Former Smoker    Quit date: 03/11/2010  . Smokeless tobacco: Never Used  . Alcohol Use: Yes     Comment: 4-5 bottles of wine per day   OB History   Grav Para Term Preterm Abortions TAB SAB Ect Mult Living                 Review of Systems  Constitutional: Negative for fever and chills.  HENT: Negative for sore throat.   Eyes: Negative for redness.  Respiratory: Negative for shortness of breath.   Cardiovascular: Negative for chest pain.  Gastrointestinal: Positive for nausea and vomiting. Negative for abdominal pain.  Genitourinary: Negative for flank pain.   Musculoskeletal: Negative for back pain and neck pain.  Skin: Negative for rash.  Neurological: Negative for headaches.  Hematological: Does not bruise/bleed easily.  Psychiatric/Behavioral: Negative for confusion.      Allergies  Other; Latex; Lipitor; Shellfish allergy; Codeine; Pentazocine lactate; and Sulfonamide derivatives  Home Medications   Prior to Admission medications   Medication Sig Start Date End Date Taking? Authorizing Provider  acamprosate (CAMPRAL) 333 MG tablet Take 2 tablets (666 mg total) by mouth 3 (three) times daily. For alcoholism 09/15/13   Encarnacion Slates, NP  diphenoxylate-atropine (LOMOTIL) 2.5-0.025 MG per tablet TAKE 1 TABLET BY MOUTH 4 TIMES DAILY AS NEEDED FOR DIARRHEA OR LOOSE STOOLS. 03/26/14   Gay Filler Copland, MD  EPINEPHrine (EPIPEN) 0.3 mg/0.3 mL SOAJ injection Inject 0.3 mLs (0.3 mg total) into the muscle once. 11/28/13   Gay Filler Copland, MD  escitalopram (LEXAPRO) 20 MG tablet Take 1 tablet (20 mg total) by mouth daily. For depression 09/15/13   Encarnacion Slates, NP  ezetimibe (ZETIA) 10 MG tablet Take 1 tablet (10 mg total) by mouth daily. For cholesterol control 09/15/13   Encarnacion Slates, NP  fish oil-omega-3 fatty acids 1000 MG capsule Take 4 capsules (4 g total) by mouth daily. For heart problem 09/15/13   Encarnacion Slates, NP  lisinopril (PRINIVIL,ZESTRIL) 5 MG tablet Take 1  tablet (5 mg total) by mouth daily. For hypertension 09/15/13   Encarnacion Slates, NP  LORazepam (ATIVAN) 2 MG tablet TAKE 1/2 OR A WHOLE TABLET BY MOUTH EVERY 8 HOURS AS NEEDED FOR AIRPLANE FLIGHT    Jessica C Copland, MD  nebivolol (BYSTOLIC) 5 MG tablet Take 1 tablet (5 mg total) by mouth daily. For hypertension 09/15/13   Encarnacion Slates, NP  traZODone (DESYREL) 50 MG tablet Take 1 tablet (50 mg total) by mouth at bedtime as needed and may repeat dose one time if needed for sleep. For sleep 09/15/13   Encarnacion Slates, NP  typhoid (VIVOTIF BERNA VACCINE) DR capsule Take 1 capsule by mouth  every other day. 11/28/13   Gay Filler Copland, MD   BP 169/85  Pulse 80  Temp(Src) 97.9 F (36.6 C) (Oral)  Resp 20  SpO2 100% Physical Exam  Nursing note and vitals reviewed. Constitutional: She is oriented to person, place, and time. She appears well-developed and well-nourished. No distress.  HENT:  Mouth/Throat: Oropharynx is clear and moist.  Eyes: Conjunctivae are normal. Pupils are equal, round, and reactive to light. No scleral icterus.  Neck: Neck supple. No tracheal deviation present.  Cardiovascular: Normal rate, regular rhythm, normal heart sounds and intact distal pulses.   Pulmonary/Chest: Effort normal and breath sounds normal. No respiratory distress.  Abdominal: Soft. Normal appearance and bowel sounds are normal. She exhibits no distension and no mass. There is no tenderness. There is no rebound and no guarding.  Musculoskeletal: She exhibits no edema and no tenderness.  Neurological: She is alert and oriented to person, place, and time.  Skin: Skin is warm and dry. No rash noted. She is not diaphoretic.  Psychiatric: She has a normal mood and affect.  Denies SI.     ED Course  Procedures (including critical care time)  Results for orders placed during the hospital encounter of 04/02/14  CBC      Result Value Ref Range   WBC 3.7 (*) 4.0 - 10.5 K/uL   RBC 4.31  3.87 - 5.11 MIL/uL   Hemoglobin 14.0  12.0 - 15.0 g/dL   HCT 40.2  36.0 - 46.0 %   MCV 93.3  78.0 - 100.0 fL   MCH 32.5  26.0 - 34.0 pg   MCHC 34.8  30.0 - 36.0 g/dL   RDW 14.5  11.5 - 15.5 %   Platelets 210  150 - 400 K/uL  COMPREHENSIVE METABOLIC PANEL      Result Value Ref Range   Sodium 136 (*) 137 - 147 mEq/L   Potassium 3.9  3.7 - 5.3 mEq/L   Chloride 89 (*) 96 - 112 mEq/L   CO2 21  19 - 32 mEq/L   Glucose, Bld 312 (*) 70 - 99 mg/dL   BUN 12  6 - 23 mg/dL   Creatinine, Ser 0.58  0.50 - 1.10 mg/dL   Calcium 8.5  8.4 - 10.5 mg/dL   Total Protein 7.1  6.0 - 8.3 g/dL   Albumin 3.8  3.5 -  5.2 g/dL   AST 39 (*) 0 - 37 U/L   ALT 20  0 - 35 U/L   Alkaline Phosphatase 112  39 - 117 U/L   Total Bilirubin 0.4  0.3 - 1.2 mg/dL   GFR calc non Af Amer >90  >90 mL/min   GFR calc Af Amer >90  >90 mL/min  URINE RAPID DRUG SCREEN (HOSP PERFORMED)      Result  Value Ref Range   Opiates NONE DETECTED  NONE DETECTED   Cocaine NONE DETECTED  NONE DETECTED   Benzodiazepines NONE DETECTED  NONE DETECTED   Amphetamines NONE DETECTED  NONE DETECTED   Tetrahydrocannabinol NONE DETECTED  NONE DETECTED   Barbiturates NONE DETECTED  NONE DETECTED  ETHANOL      Result Value Ref Range   Alcohol, Ethyl (B) 356 (*) 0 - 11 mg/dL      MDM  Labs. zofran for nausea. Protonix, iv ns bous.   Reviewed nursing notes and prior charts for additional history.   Pt w anxiety, periods agitation.  Ativan 1 mg iv.  Recheck calmer and alert. No recurrent nv.  etoh v high.    Psych team consulted re depression/anxiety, as well as etoh detox/rehab.  Recheck calm no tremor or shakes, awaiting psych eval and dispo.   Blood sugars in 177-200 range in past year, several prior hgb a1c elevated, pt not on current meds. Will place on diabetic diet, cbg qac and hs, metformin.   If blood sugars remain elevated, problematic, may consider medicine consult.        Mirna Mires, MD 04/02/14 [phone]

## 2014-04-02 NOTE — ED Notes (Signed)
Bed: WA09 Expected date:  Expected time:  Means of arrival:  Comments: ems- overdose, combative, dts

## 2014-04-02 NOTE — ED Notes (Signed)
MD aware [facility]G 312. Pysch ED requesting intervention before transfer. Medications ordered for BP. Patient stable, ambulatory.

## 2014-04-02 NOTE — ED Notes (Signed)
Patient ambulatory with steady gait. No acute distress noted.

## 2014-04-02 NOTE — ED Notes (Signed)
Per EMS: Pt has been sober for 1 year. Relapsed.  Has been on a bender for 2 wks. Pt having NV, hallucinations, easily agitated.  No SI/HI.  Requesting detox. Last drank a couple hours ago.

## 2014-04-03 DIAGNOSIS — F1020 Alcohol dependence, uncomplicated: Principal | ICD-10-CM

## 2014-04-03 DIAGNOSIS — F1994 Other psychoactive substance use, unspecified with psychoactive substance-induced mood disorder: Secondary | ICD-10-CM

## 2014-04-03 LAB — GLUCOSE, CAPILLARY
Glucose-Capillary: 134 mg/dL — ABNORMAL HIGH (ref 70–99)
Glucose-Capillary: 159 mg/dL — ABNORMAL HIGH (ref 70–99)
Glucose-Capillary: 159 mg/dL — ABNORMAL HIGH (ref 70–99)
Glucose-Capillary: 205 mg/dL — ABNORMAL HIGH (ref 70–99)
Glucose-Capillary: 235 mg/dL — ABNORMAL HIGH (ref 70–99)

## 2014-04-03 LAB — HEMOGLOBIN A1C
Hgb A1c MFr Bld: 11 % — ABNORMAL HIGH (ref <5.7)
Mean Plasma Glucose: 269 mg/dL — ABNORMAL HIGH (ref <117)

## 2014-04-03 NOTE — Progress Notes (Signed)
D: Patient in her room on approach.  Patient states she has been having alcohol withdrawal symptoms all day but states it has improved tonight.  Patient states she has not had any bouts of diarrhea and she states her tremors have decreased.  Patient states, "I was drinking non stop day and night."  Patient denies SI/HI and denies AVH.  A: Staff to monitor Q 15 mins for safety.  Encouragement and support offered.  Scheduled medications administered per orders.  Vistaril administered prn. R: Patient remains safe on the unit.  Patient did not attend group tonight.  Patient visible on the unit briefly tonight.  Patient taking administered medication but refused bedtime insulin.

## 2014-04-03 NOTE — H&P (Signed)
Psychiatric Admission Assessment Adult  Patient Identification:  [name]  Date of Evaluation:  04/03/2014  Chief Complaint:  Etoh Dependence  History of Present Illness: [name] is 68 years old, [name]. She reports, "I went to the hospital ED yesterday. I have been drinking too much, 3-4 bottles of wine x 2 weeks. Started hallucinating (seeing stuff), got afraid, went to the hospital. I have been sober since my last admission here. I relapsed 2 weeks ago because of my marriage separation. I feel really bad and uncomfortable at this moment. I'm tremulous"  Elements:  Location:  Alcoholism. Quality:  Tremors, Diaphoresis. Severity:  Severe, been drinking heavily x 14 days. Timing:  Relapsed 2 weeks ago. Duration:  Alcoholis, chronic. Context:  "Got separated from husband, got depressed, then relasped".  Associated Signs/Synptoms:  Depression Symptoms:  depressed mood, insomnia, feelings of worthlessness/guilt, anxiety, loss of energy/fatigue,  (Hypo) Manic Symptoms:  Impulsivity,  Anxiety Symptoms:  Excessive Worry,  Psychotic Symptoms:  Hallucinations: None  PTSD Symptoms: Denies  Total Time spent with patient: 1 hour  Psychiatric Specialty Exam: Physical Exam  Constitutional: She is oriented to person, place, and time. She appears well-developed.  HENT:  Head: Normocephalic.  Eyes: Pupils are equal, round, and reactive to light.  Neck: Normal range of motion.  Cardiovascular: Normal rate and regular rhythm.   Respiratory: Effort normal and breath sounds normal.  GI: Soft. Bowel sounds are normal.  Genitourinary:  Denies any issues in this area  Musculoskeletal: Normal range of motion.  Neurological: She is alert and oriented to person, place, and time.  Skin: Skin is warm and dry.  Psychiatric: Her speech is normal and behavior is normal. Thought content normal. Her mood appears anxious. Cognition and memory are normal. She expresses impulsivity.  She exhibits a depressed mood.    Review of Systems  Constitutional: Positive for chills, malaise/fatigue and diaphoresis.  HENT: Negative.   Eyes: Negative.   Cardiovascular: Negative.   Gastrointestinal: Negative.   Genitourinary: Negative.   Musculoskeletal: Positive for myalgias.  Skin: Negative.   Neurological: Positive for tremors and weakness.  Endo/Heme/Allergies: Negative.   Psychiatric/Behavioral: Positive for depression and substance abuse. Negative for suicidal ideas, hallucinations and memory loss. The patient is nervous/anxious and has insomnia.     Blood pressure 154/92, pulse 89, temperature 98.3 F (36.8 C), temperature source Oral, resp. rate 18, height 5\' 3"  (1.6 m), weight 73.483 kg (162 lb), SpO2 99.00%.Body mass index is 28.7 kg/(m^2).  General Appearance: Disheveled  Eye Contact::  Poor  Speech:  Clear and Coherent  Volume:  Decreased  Mood:  Anxious and Depressed  Affect:  Flat  Thought Process:  Coherent  Orientation:  Full (Time, Place, and Person)  Thought Content:  Denies any psychotic symptoms  Suicidal Thoughts:  No  Homicidal Thoughts:  No  Memory:  Immediate;   Good Recent;   Good Remote;   Fair  Judgement:  Impaired  Insight:  Lacking  Psychomotor Activity:  Restlessness and Tremor  Concentration:  Fair  Recall:  Good  Fund of Knowledge:Poor  Language: Good  Akathisia:  No  Handed:  Right  AIMS (if indicated):     Assets:  Desire for Improvement  Sleep:  Number of Hours: 5.5    Musculoskeletal: Strength & Muscle Tone: within normal limits Gait & Station: normal Patient leans: N/A  Past Psychiatric History: Diagnosis: Alcohol dependence. Substance induced mood disorder  Hospitalizations: [facility] adult unit x 5  Outpatient Care: [facility]  Substance Abuse Care: [facility], 2013  Self-Mutilation: Denies  Suicidal Attempts: Denies  Violent Behaviors: Denies   Past Medical History:   Past Medical History  Diagnosis Date  .  Alcohol abuse   . Diabetes mellitus   . Hypertension   . Fibromyalgia   . Depression   . Carotid artery stenosis    Cardiac History:  Carotid artery stenosis, HTN  Allergies:   Allergies  Allergen Reactions  . Other Anaphylaxis    shellfish  . Latex Other (See Comments)    Break outs  . Lipitor [Atorvastatin Calcium] Other (See Comments)    Muscle aches  . Shellfish Allergy   . Codeine Rash    And nausea   . Pentazocine Lactate Rash  . Sulfonamide Derivatives Rash   PTA Medications: Prescriptions prior to admission  Medication Sig Dispense Refill  . diphenoxylate-atropine (LOMOTIL) 2.5-0.025 MG per tablet TAKE 1 TABLET BY MOUTH 4 TIMES DAILY AS NEEDED FOR DIARRHEA OR LOOSE STOOLS.  30 tablet  0  . escitalopram (LEXAPRO) 20 MG tablet Take 1 tablet (20 mg total) by mouth daily. For depression  30 tablet  0  . ezetimibe (ZETIA) 10 MG tablet Take 1 tablet (10 mg total) by mouth daily. For cholesterol control      . lisinopril (PRINIVIL,ZESTRIL) 5 MG tablet Take 1 tablet (5 mg total) by mouth daily. For hypertension      . LORazepam (ATIVAN) 2 MG tablet TAKE 1/2 OR A WHOLE TABLET BY MOUTH EVERY 8 HOURS AS NEEDED FOR AIRPLANE FLIGHT  15 tablet  0  . nebivolol (BYSTOLIC) 5 MG tablet Take 1 tablet (5 mg total) by mouth daily. For hypertension  30 tablet    . vitamin E 1000 UNIT capsule Take 1,000 Units by mouth daily.      Marland Kitchen amphetamine-dextroamphetamine (ADDERALL XR) 30 MG 24 hr capsule Take 60 mg by mouth daily as needed (adhd).      . EPINEPHrine (EPIPEN) 0.3 mg/0.3 mL SOAJ injection Inject 0.3 mLs (0.3 mg total) into the muscle once.  1 Device  2  . fish oil-omega-3 fatty acids 1000 MG capsule Take 4 capsules (4 g total) by mouth daily. For heart problem      . OLANZapine (ZYPREXA) 5 MG tablet Take 5 mg by mouth daily.        Previous Psychotropic Medications:  Medication/Dose  See medication lists               Substance Abuse History in the last 12 months:   yes  Consequences of Substance Abuse: Medical Consequences:  Liver damage, Possible death by overdose Legal Consequences:  Arrests, jail time, Loss of driving privilege. Family Consequences:  Family discord, divorce and or separation.  Social History:  reports that she quit smoking about 4 years ago. She has never used smokeless tobacco. She reports that she drinks alcohol. She reports that she does not use illicit drugs. Additional Social History: Current Place of Residence: [place], [place]   Place of Birth:  [place]   Family Members: "My daughter"  Marital Status:  Separated  Children: 1  Sons: 0  Daughters: 1  Relationships: Separated  Education:  [employer] Charity fundraiser Problems/Performance: Completed high school  Religious Beliefs/Practices: NA  History of Abuse (Emotional/Phsycial/Sexual): Denies  Occupational Experiences: Retied  Nature conservation officer History:  None.  Legal History: Denies  Hobbies/Interests: None reported  Family History:   Family History  Problem Relation Age of Onset  . Cancer Mother   .  Heart disease Father   . Heart disease Sister     Results for orders placed during the hospital encounter of 04/02/14 (from the past 72 hour(s))  GLUCOSE, CAPILLARY     Status: Abnormal   Collection Time    04/03/14 12:03 AM      Result Value Ref Range   Glucose-Capillary 235 (*) 70 - 99 mg/dL   Comment 1 Notify RN     Comment 2 Documented in Chart    GLUCOSE, CAPILLARY     Status: Abnormal   Collection Time    04/03/14  6:03 AM      Result Value Ref Range   Glucose-Capillary 134 (*) 70 - 99 mg/dL   Psychological Evaluations:  Assessment:   DSM5: Schizophrenia Disorders:  NA Obsessive-Compulsive Disorders:  NA Trauma-Stressor Disorders:  NA Substance/Addictive Disorders:  Alcohol Related Disorder - Severe (303.90) Depressive Disorders:  Substance induced mood disorder  AXIS I:  Alcohol dependence. Substance induced mood disorder AXIS II:   Deferred AXIS III:   Past Medical History  Diagnosis Date  . Alcohol abuse   . Diabetes mellitus   . Hypertension   . Fibromyalgia   . Depression   . Carotid artery stenosis    AXIS IV:  other psychosocial or environmental problems and Alcoholism, chronic AXIS V:  1-10 persistent dangerousness to self and others present  Treatment Plan/Recommendations: 1. Admit for crisis management and stabilization, estimated length of stay 3-5 days.  2. Medication management to reduce current symptoms to base line and improve the patient's overall level of functioning  3. Treat health problems as indicated.  4. Develop treatment plan to decrease risk of relapse upon discharge and the need for readmission.  5. Psycho-social education regarding relapse prevention and self care.  6. Health care follow up as needed for medical problems.  7. Review, reconcile, and reinstate any pertinent home medications for other health issues where appropriate. 8. Call for consults with hospitalist for any additional specialty patient care services as needed.  Treatment Plan Summary: Daily contact with patient to assess and evaluate symptoms and progress in treatment Medication management  Current Medications:  Current Facility-Administered Medications  Medication Dose Route Frequency Provider Last Rate Last Dose  . acetaminophen (TYLENOL) tablet 650 mg  650 mg Oral Q6H PRN Laverle Hobby, PA-C      . alum & mag hydroxide-simeth (MAALOX/MYLANTA) 200-200-20 MG/5ML suspension 30 mL  30 mL Oral Q4H PRN Laverle Hobby, PA-C      . chlordiazePOXIDE (LIBRIUM) capsule 25 mg  25 mg Oral Q6H PRN Laverle Hobby, PA-C   25 mg at 04/03/14 0818  . chlordiazePOXIDE (LIBRIUM) capsule 25 mg  25 mg Oral QID Laverle Hobby, PA-C   25 mg at 04/03/14 9678   Followed by  . [START ON 04/04/2014] chlordiazePOXIDE (LIBRIUM) capsule 25 mg  25 mg Oral TID Laverle Hobby, PA-C       Followed by  . [START ON 04/05/2014] chlordiazePOXIDE  (LIBRIUM) capsule 25 mg  25 mg Oral BH-qamhs Spencer E Simon, PA-C       Followed by  . [START ON 04/07/2014] chlordiazePOXIDE (LIBRIUM) capsule 25 mg  25 mg Oral Daily Laverle Hobby, PA-C      . diphenoxylate-atropine (LOMOTIL) 2.5-0.025 MG per tablet 2 tablet  2 tablet Oral QID PRN Laverle Hobby, PA-C   2 tablet at 04/03/14 0005  . escitalopram (LEXAPRO) tablet 20 mg  20 mg Oral Daily Laverle Hobby, PA-C  20 mg at 04/03/14 2229  . ezetimibe (ZETIA) tablet 10 mg  10 mg Oral Daily Laverle Hobby, PA-C   10 mg at 04/03/14 7989  . haloperidol (HALDOL) tablet 2 mg  2 mg Oral Q6H PRN Laverle Hobby, PA-C      . hydrOXYzine (ATARAX/VISTARIL) tablet 25 mg  25 mg Oral Q6H PRN Laverle Hobby, PA-C      . insulin aspart (novoLOG) injection 0-15 Units  0-15 Units Subcutaneous TID WC Spencer E Simon, PA-C      . insulin aspart (novoLOG) injection 0-5 Units  0-5 Units Subcutaneous QHS Spencer E Simon, PA-C      . lisinopril (PRINIVIL,ZESTRIL) tablet 5 mg  5 mg Oral Daily Laverle Hobby, PA-C   5 mg at 04/03/14 2119  . magnesium hydroxide (MILK OF MAGNESIA) suspension 30 mL  30 mL Oral Daily PRN Laverle Hobby, PA-C      . multivitamin with minerals tablet 1 tablet  1 tablet Oral Daily Laverle Hobby, PA-C   1 tablet at 04/03/14 4174  . nebivolol (BYSTOLIC) tablet 5 mg  5 mg Oral Daily Laverle Hobby, PA-C   5 mg at 04/03/14 0816  . ondansetron (ZOFRAN-ODT) disintegrating tablet 4 mg  4 mg Oral Q6H PRN Laverle Hobby, PA-C   4 mg at 04/03/14 0818  . thiamine (B-1) injection 100 mg  100 mg Intramuscular Once [employer], PA-C      . thiamine (VITAMIN B-1) tablet 100 mg  100 mg Oral Daily Laverle Hobby, PA-C   100 mg at 04/03/14 0814  . traZODone (DESYREL) tablet 50 mg  50 mg Oral QHS,MR X 1 Laverle Hobby, PA-C        Observation Level/Precautions:  15 minute checks  Laboratory:  Per ED  Psychotherapy:  Group sessions  Medications: Librium detox  Consultations:  As needed  Discharge  Concerns: Sobriety    Estimated LOS: 2-4 days  Other:     I certify that inpatient services furnished can reasonably be expected to improve the patient's condition.   Encarnacion Slates, PMHNP-BC 4/22/20159:46 AM Personally evaluated the patient, reviewed the physical exam and agree with assessment and plan Geralyn Flash A. Sabra Heck, M.D.

## 2014-04-03 NOTE — BHH Group Notes (Signed)
[facility] LCSW Aftercare Discharge Planning Group Note   04/03/2014 10:05 AM  Participation Quality:  Did not attend.    [name]

## 2014-04-03 NOTE — Progress Notes (Addendum)
Patient ID: [name], female   DOB: 10-31-1946, 68 y.o.   MRN: [id number]  Pt is here for etoh detox. Report states pt has been binge drinking for apprx 2 wks with the last drink being 04/21.  Pt was pleasant and cooperative, but slightly anxious during the adm process. Writer was able to get some paperwork signed by the pt while mht was going thru her belongings.  After the belongings check writer instructed pt go behind the screen and explained the search process. While behind the curtain writer heard pt say, "I think I need to get to a bathroom". Pt came out with her gown around her. At the door to the hall, pt began with diarrhea. Pt tracked feces from the door of the search room into the ladies bathroom. Writer and mht cleaned the hall and took linen to the pt. Writer instructed and assisted pt in getting a bath. Pt asked the writer if she could be given medication for tremors. Informed pt that the adm would be finished tomorrow. Pt was given lomotil and 50 mg of librium.

## 2014-04-03 NOTE — Progress Notes (Signed)
Patient ID: [name], female   DOB: 31-Aug-1946, 68 y.o.   MRN: [id number] D: Patient has not been able to attend groups or go to the cafeteria today.  She reports poor sleep prior to admission.  She has been drowsy and lethargic.  Patient complains of severe withdrawal symptoms.  Patient has significant tremors, diarrhea, headache and cravings.  Patient's admission has been completed, as she was not able to cooperate last night.  She has received two doses of librium plus one prn dose.  Patient was given lamotol for diarrhea.  Patient has been living alone and is going through a divorce which has not been finalized.  She reports heavy drinking for the past two weeks consuming 3-4 bottles of wine daily.  She admits to blackouts and drinking in the morning.  Patient has been allowed to rest quietly in room today due to her withdrawal symptoms and stomach cramping.  She denies any SI/HI/AVH.  A: continue to monitor medication management and MD orders.  Safety checks completed every 15 minutes per protocol.  R: patient isolative to room today.

## 2014-04-03 NOTE — Tx Team (Signed)
Initial Interdisciplinary Treatment Plan  PATIENT STRENGTHS: (choose at least two) Ability for insight Average or above average intelligence Capable of independent living Communication skills General fund of knowledge Motivation for treatment/growth Physical Health Supportive family/friends Work skills  PATIENT STRESSORS: Medication change or noncompliance Substance abuse   PROBLEM LIST: Problem List/Patient Goals Date to be addressed Date deferred Reason deferred Estimated date of resolution  Substance abuse 04/02/14     Increased risk for suicide 04/02/14     Medical conditions 04/02/14                                          DISCHARGE CRITERIA:  Ability to meet basic life and health needs Adequate post-discharge living arrangements Improved stabilization in mood, thinking, and/or behavior Medical problems require only outpatient monitoring Motivation to continue treatment in a less acute level of care Need for constant or close observation no longer present Reduction of life-threatening or endangering symptoms to within safe limits Safe-care adequate arrangements made Verbal commitment to aftercare and medication compliance Withdrawal symptoms are absent or subacute and managed without 24-hour nursing intervention  PRELIMINARY DISCHARGE PLAN: Attend 12-step recovery group Outpatient therapy Participate in family therapy Return to previous living arrangement  PATIENT/FAMIILY INVOLVEMENT: This treatment plan has been presented to and reviewed with the patient, [name], and/or family member.  The patient and family have been given the opportunity to ask questions and make suggestions.  Doran Heater 04/03/2014, 4:52 AM

## 2014-04-03 NOTE — BHH Group Notes (Signed)
[facility] LCSW Group Therapy  04/03/2014 2:28 PM  Type of Therapy:  Group Therapy  Participation Level:  Did Not Attend    Summary of Progress/Problems:  The purpose of this group is to assist patients in learning to regulate negative emotions and experience positive emotions. Patients will be guided to discuss ways in which they have been vulnerable to their negative emotions. These vulnerabilities will be juxtaposed with experiences of positive emotions or situations, and patients challenged to use positive emotions to combat negative ones.   Lilly Cove 04/03/2014, 2:28 PM

## 2014-04-03 NOTE — BHH Suicide Risk Assessment (Signed)
Suicide Risk Assessment  Admission Assessment     Nursing information obtained from:    Demographic factors:    Current Mental Status:    Loss Factors:    Historical Factors:    Risk Reduction Factors:    Total Time spent with patient: 45 minutes  CLINICAL FACTORS:   Depression:   Comorbid alcohol abuse/dependence Alcohol/Substance Abuse/Dependencies  Psychiatric Specialty Exam:     Blood pressure 130/77, pulse 78, temperature 98.3 F (36.8 C), temperature source Oral, resp. rate 18, height 5\' 3"  (1.6 m), weight 73.483 kg (162 lb), SpO2 99.00%.Body mass index is 28.7 kg/(m^2).  General Appearance: Disheveled  Eye Contact::  Minimal  Speech:  Clear and Coherent  Volume:  Decreased  Mood:  Anxious and Depressed  Affect:  Restricted  Thought Process:  Coherent and Goal Directed  Orientation:  Full (Time, Place, and Person)  Thought Content:  events, symptoms, worries, concerns  Suicidal Thoughts:  No  Homicidal Thoughts:  No  Memory:  Immediate;   Fair Recent;   Fair Remote;   Fair  Judgement:  Fair  Insight:  Present and Shallow  Psychomotor Activity:  Restlessness  Concentration:  Fair  Recall:  [employer] of Knowledge:NA  Language: Fair  Akathisia:  No  Handed:    AIMS (if indicated):     Assets:  Desire for Improvement  Sleep:  Number of Hours: 5.5   Musculoskeletal: Strength & Muscle Tone: within normal limits Gait & Station: normal Patient leans: N/A  COGNITIVE FEATURES THAT CONTRIBUTE TO RISK:  Closed-mindedness Polarized thinking Thought constriction (tunnel vision)    SUICIDE RISK:   Moderate:  Frequent suicidal ideation with limited intensity, and duration, some specificity in terms of plans, no associated intent, good self-control, limited dysphoria/symptomatology, some risk factors present, and identifiable protective factors, including available and accessible social support.  PLAN OF CARE: Supportive approach/coping skills/relapse prevention                             Librium detox/reassess and address the co morbidities  I certify that inpatient services furnished can reasonably be expected to improve the patient's condition.  [name] 04/03/2014, 5:56 PM

## 2014-04-04 DIAGNOSIS — F321 Major depressive disorder, single episode, moderate: Secondary | ICD-10-CM

## 2014-04-04 LAB — GLUCOSE, CAPILLARY
Glucose-Capillary: 165 mg/dL — ABNORMAL HIGH (ref 70–99)
Glucose-Capillary: 248 mg/dL — ABNORMAL HIGH (ref 70–99)
Glucose-Capillary: 220 mg/dL — ABNORMAL HIGH (ref 70–99)
Glucose-Capillary: 216 mg/dL — ABNORMAL HIGH (ref 70–99)

## 2014-04-04 NOTE — Progress Notes (Signed)
Patient did attend the evening karaoke group. Pt was attentive and supportive but did not participate by singing a song.   

## 2014-04-04 NOTE — Progress Notes (Signed)
[facility] MD Progress [name]  04/04/2014 5:49 PM [name]  MRN:  [id number] Subjective:  [name] continues to detox. States that she is dealing with the last phase of her divorce. She should get the papers today or tomorrow. States she is getting half of everything he owns. States she plans to sell the house and move back to [place]. Sates that is where her family is and where she got longer term Recovery. States this environment down here has not been conductive for her to achieve it. Still anxious feeling shaky insight. States at home she is isolated as she lives in [place] country and has no license. It has been hard for her to get out go to meetings etc. Diagnosis:   DSM5: Schizophrenia Disorders:  none Obsessive-Compulsive Disorders:  none Trauma-Stressor Disorders:  none Substance/Addictive Disorders:  Alcohol Related Disorder - Severe (303.90) Depressive Disorders:  Major Depressive Disorder - Moderate (296.22) Total Time spent with patient: 30 minutes  Axis I: Substance Induced Mood Disorder  ADL's:  Intact  Sleep: Fair  Appetite:  Fair  Suicidal Ideation:  Plan:  denies Intent:  denies Means:  denies Homicidal Ideation:  Plan:  denies Intent:  denies Means:  denies AEB (as evidenced by):  Psychiatric Specialty Exam: Physical Exam  Review of Systems  Constitutional: Positive for malaise/fatigue.  HENT: Negative.   Eyes: Negative.   Respiratory: Negative.   Cardiovascular: Negative.   Gastrointestinal: Negative.   Genitourinary: Negative.   Musculoskeletal: Negative.   Skin: Negative.   Neurological: Negative.   Endo/Heme/Allergies: Negative.   Psychiatric/Behavioral: Positive for substance abuse. The patient is nervous/anxious.     Blood pressure 120/73, pulse 77, temperature 98.1 F (36.7 C), temperature source Oral, resp. rate 16, height 5\' 3"  (1.6 m), weight 73.483 kg (162 lb), SpO2 99.00%.Body mass index is 28.7 kg/(m^2).  General Appearance:  Disheveled  Eye Sport and exercise psychologist::  Fair  Speech:  Clear and Coherent and not spontaneous  Volume:  Decreased  Mood:  Anxious, Depressed and worried, tired  Affect:  Restricted  Thought Process:  Coherent and Goal Directed  Orientation:  Full (Time, Place, and Person)  Thought Content:  symptoms, events, worries, concerns  Suicidal Thoughts:  No  Homicidal Thoughts:  No  Memory:  Immediate;   Fair Recent;   Fair Remote;   Fair  Judgement:  Fair  Insight:  Present  Psychomotor Activity:  Restlessness  Concentration:  Fair  Recall:  [employer] of Knowledge:NA  Language: Fair  Akathisia:  No  Handed:    AIMS (if indicated):     Assets:  Desire for Improvement Housing  Sleep:  Number of Hours: 6.25   Musculoskeletal: Strength & Muscle Tone: within normal limits Gait & Station: normal Patient leans: N/A  Current Medications: Current Facility-Administered Medications  Medication Dose Route Frequency Provider Last Rate Last Dose  . acetaminophen (TYLENOL) tablet 650 mg  650 mg Oral Q6H PRN Laverle Hobby, PA-C      . alum & mag hydroxide-simeth (MAALOX/MYLANTA) 200-200-20 MG/5ML suspension 30 mL  30 mL Oral Q4H PRN Laverle Hobby, PA-C      . chlordiazePOXIDE (LIBRIUM) capsule 25 mg  25 mg Oral Q6H PRN Laverle Hobby, PA-C   25 mg at 04/03/14 0818  . chlordiazePOXIDE (LIBRIUM) capsule 25 mg  25 mg Oral TID Laverle Hobby, PA-C   25 mg at 04/04/14 1158   Followed by  . [START ON 04/05/2014] chlordiazePOXIDE (LIBRIUM) capsule 25 mg  25 mg Oral BH-qamhs Laverle Hobby, PA-C       Followed by  . [START ON 04/07/2014] chlordiazePOXIDE (LIBRIUM) capsule 25 mg  25 mg Oral Daily Laverle Hobby, PA-C      . diphenoxylate-atropine (LOMOTIL) 2.5-0.025 MG per tablet 2 tablet  2 tablet Oral QID PRN Laverle Hobby, PA-C   2 tablet at 04/03/14 1828  . escitalopram (LEXAPRO) tablet 20 mg  20 mg Oral Daily Laverle Hobby, PA-C   20 mg at 04/04/14 0840  . ezetimibe (ZETIA) tablet 10 mg  10 mg Oral  Daily Laverle Hobby, PA-C   10 mg at 04/04/14 0840  . haloperidol (HALDOL) tablet 2 mg  2 mg Oral Q6H PRN Laverle Hobby, PA-C      . hydrOXYzine (ATARAX/VISTARIL) tablet 25 mg  25 mg Oral Q6H PRN Laverle Hobby, PA-C   25 mg at 04/03/14 2215  . insulin aspart (novoLOG) injection 0-15 Units  0-15 Units Subcutaneous TID WC Laverle Hobby, PA-C   5 Units at 04/04/14 1719  . insulin aspart (novoLOG) injection 0-5 Units  0-5 Units Subcutaneous QHS Spencer E Simon, PA-C      . lisinopril (PRINIVIL,ZESTRIL) tablet 5 mg  5 mg Oral Daily Laverle Hobby, PA-C   5 mg at 04/04/14 0840  . magnesium hydroxide (MILK OF MAGNESIA) suspension 30 mL  30 mL Oral Daily PRN Laverle Hobby, PA-C      . multivitamin with minerals tablet 1 tablet  1 tablet Oral Daily Laverle Hobby, PA-C   1 tablet at 04/04/14 0841  . nebivolol (BYSTOLIC) tablet 5 mg  5 mg Oral Daily Laverle Hobby, PA-C   5 mg at 04/04/14 0841  . ondansetron (ZOFRAN-ODT) disintegrating tablet 4 mg  4 mg Oral Q6H PRN Laverle Hobby, PA-C   4 mg at 04/03/14 0818  . thiamine (B-1) injection 100 mg  100 mg Intramuscular Once [employer], PA-C      . thiamine (VITAMIN B-1) tablet 100 mg  100 mg Oral Daily Laverle Hobby, PA-C   100 mg at 04/04/14 0840  . traZODone (DESYREL) tablet 50 mg  50 mg Oral QHS,MR X 1 Laverle Hobby, PA-C        Lab Results:  Results for orders placed during the hospital encounter of 04/02/14 (from the past 48 hour(s))  GLUCOSE, CAPILLARY     Status: Abnormal   Collection Time    04/03/14 12:03 AM      Result Value Ref Range   Glucose-Capillary 235 (*) 70 - 99 mg/dL   Comment 1 Notify RN     Comment 2 Documented in Chart    GLUCOSE, CAPILLARY     Status: Abnormal   Collection Time    04/03/14  6:03 AM      Result Value Ref Range   Glucose-Capillary 134 (*) 70 - 99 mg/dL  HEMOGLOBIN A1C     Status: Abnormal   Collection Time    04/03/14  6:20 AM      Result Value Ref Range   Hemoglobin A1C 11.0 (*) <5.7 %    Comment: ([name])  According to the ADA Clinical Practice Recommendations for 2011, when     HbA1c is used as a screening test:      >=6.5%   Diagnostic of Diabetes Mellitus               (if abnormal result is confirmed)     5.7-6.4%   Increased risk of developing Diabetes Mellitus     References:Diagnosis and Classification of Diabetes Mellitus,Diabetes     VHQI,6962,95(MWUXL 1):S62-S69 and Standards of Medical Care in             Diabetes - 2011,Diabetes KGMW,1027,25 (Suppl 1):S11-S61.   Mean Plasma Glucose 269 (*) <117 mg/dL   Comment: Performed at [place], CAPILLARY     Status: Abnormal   Collection Time    04/03/14 11:50 AM      Result Value Ref Range   Glucose-Capillary 159 (*) 70 - 99 mg/dL  GLUCOSE, CAPILLARY     Status: Abnormal   Collection Time    04/03/14  4:56 PM      Result Value Ref Range   Glucose-Capillary 159 (*) 70 - 99 mg/dL  GLUCOSE, CAPILLARY     Status: Abnormal   Collection Time    04/03/14  9:03 PM      Result Value Ref Range   Glucose-Capillary 205 (*) 70 - 99 mg/dL  GLUCOSE, CAPILLARY     Status: Abnormal   Collection Time    04/04/14  6:08 AM      Result Value Ref Range   Glucose-Capillary 165 (*) 70 - 99 mg/dL  GLUCOSE, CAPILLARY     Status: Abnormal   Collection Time    04/04/14 11:52 AM      Result Value Ref Range   Glucose-Capillary 248 (*) 70 - 99 mg/dL  GLUCOSE, CAPILLARY     Status: Abnormal   Collection Time    04/04/14  4:39 PM      Result Value Ref Range   Glucose-Capillary 220 (*) 70 - 99 mg/dL    Physical Findings: AIMS: Facial and Oral Movements Muscles of Facial Expression: None, normal Lips and Perioral Area: None, normal Jaw: None, normal Tongue: None, normal,Extremity Movements Upper (arms, wrists, hands, fingers): None, normal Lower (legs, knees, ankles, toes): None, normal, Trunk Movements Neck, shoulders, hips: None,  normal, Overall Severity Severity of abnormal movements (highest score from questions above): None, normal Incapacitation due to abnormal movements: None, normal Patient's awareness of abnormal movements (rate only patient's report): No Awareness, Dental Status Current problems with teeth and/or dentures?: No Does patient usually wear dentures?: No  CIWA:  CIWA-Ar Total: 2 COWS:     Treatment Plan Summary: Daily contact with patient to assess and evaluate symptoms and progress in treatment Medication management  Plan: Supportive approach/coping skills/relapse prevention           Complete the detox/address the co morbidities  Medical Decision Making Problem Points:  Review of psycho-social stressors (1) Data Points:  Review of medication regiment & side effects (2)  I certify that inpatient services furnished can reasonably be expected to improve the patient's condition.   Nicholaus Bloom 04/04/2014, 5:49 PM

## 2014-04-04 NOTE — BHH Group Notes (Signed)
[place] LCSW Group Therapy  04/04/2014 2:49 PM  Type of Therapy:  Group Therapy  Participation Level:  Did Not Attend   Summary of Progress/Problems:  This group will allow patients to explore their thoughts and feelings about diagnoses they have received. Patients will be guided to explore their level of understanding and acceptance of these diagnoses. Facilitator will encourage patients to process their thoughts and feelings about the reactions of others to their diagnosis, and will guide patients in identifying ways to discuss their diagnosis with significant others in their lives.  [name] 04/04/2014, 2:49 PM

## 2014-04-04 NOTE — Progress Notes (Signed)
0900 nursing orientation group  The focus of this group is to educate the patient on the purpose and policies of crisis stabilization and provide a format to answer questions about their admission.  The group details unit policies and expectations of patients while admitted.  Pt was an active participant in group.  She stated, "my goal today stay in the present and my granddaughter makes me happy"

## 2014-04-04 NOTE — Tx Team (Signed)
Date:04/04/2014  Progress in Treatment:  Attending groups: Yes  Participating in groups: no due to withdraw from alcohol  Taking medication as prescribed: Yes  Tolerating medication: Yes  Family/Significant othe contact made: no patient refused Patient understands diagnosis: Yes reports depression from divorce led her to relapse  Discussing patient identified problems/goals with staff: Yes  Medical problems stabilized or resolved: Yes  Denies suicidal/homicidal ideation: Yes Patient has not harmed self or Others: Yes   New problem(s) identified:   Discharge Plan or Barriers:  DC home with current providers.  AA and urgent care  Additional comments: n/a   Reason for Continuation of Hospitalization:   Anxiety Depression Medical Issues Medication stabilization Suicidal ideation Withdrawal symptoms    Estimated length of stay: 3-5 days     For review of initial/current patient goals, please see plan of care.  Attendees:  Attendees:  Patient:    Family:    Physician: Dr. Carrington Clamp  04/04/2014 10:36 AM  Nursing: Lars Pinks, RN Case manager     Clinical Social Worker Vidal Schwalbe, LCSW, MSW    Other: Earl Many, [place]   Other: Chrys Racer RN   Other: Butch Penny, RN Charge Nurse   Other:      Vidal Schwalbe, LCSW, MSW   04/04/2014

## 2014-04-04 NOTE — BHH Counselor (Signed)
Adult Psychosocial Assessment Update Interdisciplinary Team  Previous [facility] admissions/discharges:  Admissions Discharges  Date:9/14 Date:  Date:6/14 Date:  Date:7/13 Date:  Date:6/13 Date:  Date:3/13 Date:   Changes since the last Psychosocial Assessment (including adherence to outpatient mental health and/or substance abuse treatment, situational issues contributing to decompensation and/or relapse). Patient has been living alone and is going through a divorce which has not been finalized. She reports heavy drinking for the past two weeks consuming 3-4 bottles of wine daily. She admits to blackouts and drinking in the morning.  Reports she has been living alone, tying up loose ends with her divorce and mediation and relapsed 2 weeks ago.  Reports she has only been following up at [facility], has doctors in [place], but not wanting to return and has a sponsor via phone.  Reports she is very tired of relapsing and repeating the cycle associated with alcohol and wants to go to [place] to be with supportive family and where she has had longevity of sobriety.            Discharge Plan 1. Will you be returning to the same living situation after discharge?   TKW:IOXB No:      If no, what is your plan?    Reports she will go back home and then in the next month she will go to [place] and stay with family.       2. Would you like a referral for services when you are discharged? Yes:     If yes, for what services?  No:   X    Reports she will go back to [place] has a doctor in [place] and follow up with AA and urgent care. Reports she does not need an appointment and has refills on all medications.       Summary and Recommendations (to be completed by the evaluator) [name] is an 68 y.o. female presenting to [facility] seeking detox from alcohol. [name] she has been on a 2 week alcohol binge. Reports drinking 3 small bottles of wine each day. Last drink was last night/this  morning drinking drinking 3 large glasses of wine. She initially started drinking at age 36 and has struggled with alcoholism every since Patient has received detox and treatment at several facilities including [facility] (9/14,6/14,7/13,6/13,3/13, 9/12). Her last treatment hospitalizaiton was at [facility] last yr.    Patient has been withdrawing from alcohol for last 48 hours. Today is her first day meeting with team and LCSW. Reports she is feeling better and not withdrawing currently. Patient currently on [facility] hall for detox. Will participate in group therapy, aftercare planning, and medication management.                      Signature:  Lilly Cove, 04/04/2014 8:35 AM

## 2014-04-04 NOTE — Progress Notes (Signed)
Patient ID: [name], female   DOB: 16-Mar-1946, 68 y.o.   MRN: [id number] She has been up and to part of the groups interacting with peers and staff. Self inventory: depression4, hopelessness 4, denies withdrawal symptoms and SI thoughts.

## 2014-04-04 NOTE — Progress Notes (Signed)
D. Pt visible in milieu this evening, somewhat upset that she received divorce papers this evening. Pt spoke about how she knew they were coming but it was still hard. Spoke about having 77 years of marriage. Pt does appear anxious, and does have some mild tremors. A. Support and encouragement provided, medication education given. R. Pt verbalized understanding, safety maintained.

## 2014-04-04 NOTE — BHH Suicide Risk Assessment (Signed)
[place] INPATIENT:  Family/Significant Other Suicide Prevention Education  Suicide Prevention Education:  Patient Refusal for Family/Significant Other Suicide Prevention Education: The patient [name] has refused to provide written consent for family/significant other to be provided Family/Significant Other Suicide Prevention Education during admission and/or prior to discharge.  Physician notified.  Lilly Cove 04/04/2014, 10:41 AM

## 2014-04-05 LAB — GLUCOSE, CAPILLARY
Glucose-Capillary: 147 mg/dL — ABNORMAL HIGH (ref 70–99)
Glucose-Capillary: 169 mg/dL — ABNORMAL HIGH (ref 70–99)
Glucose-Capillary: 241 mg/dL — ABNORMAL HIGH (ref 70–99)
Glucose-Capillary: 217 mg/dL — ABNORMAL HIGH (ref 70–99)

## 2014-04-05 MED ORDER — LORAZEPAM 1 MG PO TABS
1.0000 mg | ORAL_TABLET | Freq: Four times a day (QID) | ORAL | Status: DC | PRN
  Administered 2014-04-05 – 2014-04-06 (×2): 1 mg via ORAL
  Filled 2014-04-05 (×2): qty 1

## 2014-04-05 NOTE — Progress Notes (Signed)
[name] is seen OOB UAL on the 300 hal today...she tolerates this well. SHe is compliant with her poc in that she attends her groups as scheduled, she completed her  Morning self inventory and on it she wrotes she denies SI within the previous 24 hrs,, she rates her derpession and hopelessness  "2/2" and says her DC plans inclues moving back to her support areas, ie so she can be closes to the ones who su-pport her the most. Her CBG's are checked AC and hs and she was given 3 units novolog insulin SQ this afternoon at 1200, per CBG 169 ( before lunch). POC contd and maintained.

## 2014-04-05 NOTE — Progress Notes (Signed)
NUTRITION ASSESSMENT  Pt identified as at risk on the Malnutrition Screen Tool  INTERVENTION: Educated patient on the importance of nutrition and encouraged intake of food and beverages.   NUTRITION DIAGNOSIS: Inadequate oral intake related to heavy drinking as evidenced by pt report.   Goal: Pt to meet >/= 90% of their estimated nutrition needs.  Monitor:  PO intake  Assessment:  Pt is 68 years old, [name]. She reports, "I have been drinking too much, 3-4 bottles of wine x 2 weeks. Started hallucinating (seeing stuff), got afraid, went to the hospital. I have been sober since my last admission here. I relapsed 2 weeks ago because of my marriage separation".   Hx of DM, HTN, fibromyalgia, depression, and carotid artery stenosis.   Met with pt who reports her blood sugars were under control at home as she was eating a lot of whole grains and consuming beverages that were sugar free. States it's harder during admission to get her blood sugars normal due to having different food. Pt reports when she was sober she was eating a good breakfast and dinner with a half of a sandwich and some fruit for lunch. When she was drinking, she would only eat 1 meal/day. Reports some unintentional weight gain recently from fluid. 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 Encouraged pt to go to American Diabetes Association website if she has questions regarding diabetic diet, healthy ways to eat out, and diabetic friendly recipe ideas. Pt reports good appetite and declines needing any nutritional supplements at this time.    Lab Results  Component Value Date   HGBA1C 11.0* 04/03/2014     68 y.o. female  Height: Ht Readings from Last 1 Encounters:  04/02/14 _0  (1.6 m)    Weight: Wt Readings from Last 1 Encounters:  04/02/14 162 lb (73.483 kg)    Weight Hx: Wt Readings from Last 10 Encounters:  04/02/14 162 lb (73.483 kg)  11/28/13 160 lb 3.2 oz (72.666 kg)   09/11/13 157 lb (71.215 kg)  05/26/13 162 lb (73.483 kg)  06/21/12 149 lb (67.586 kg)  06/20/12 206 lb (93.441 kg)  03/12/12 149 lb (67.586 kg)  01/03/12 150 lb (68.04 kg)  11/11/11 150 lb (68.04 kg)  08/20/11 155 lb (70.308 kg)    BMI:  Body mass index is 28.7 kg/(m^2). Pt meets criteria for overweight based on current BMI.  Estimated Nutritional Needs: Kcal: 25-30 kcal/kg Protein: > 1 gram protein/kg Fluid: 1 ml/kcal  Diet Order: Carb Control Pt is also offered choice of unit snacks mid-morning and mid-afternoon.  Pt is eating as desired.   Lab results and medications reviewed. CBGs elevated. Getting thiamine and multivitamin.   Mikey College MS, [place], [place] Pager [phone] After Hours Pager

## 2014-04-05 NOTE — Progress Notes (Signed)
Patient ID: [name], female   DOB: 1946/06/01, 68 y.o.   MRN: [id number] [facility] MD Progress Note  04/05/2014 12:00 PM [name]  MRN:  [id number]  Subjective:  [name] continues to detox. She says she is feeling better however, adds that 'she is not dancing on a tulip' She states that she is ready to go home in am. When she gets home, plans finishing up with divorcing husband, put her house up for sell and move to [place] where she has family support. She is still experiencing mild tremors, mood is improving gradually.  Diagnosis:   DSM5: Schizophrenia Disorders:  none Obsessive-Compulsive Disorders:  none Trauma-Stressor Disorders:  none Substance/Addictive Disorders:  Alcohol Related Disorder - Severe (303.90) Depressive Disorders:  Major Depressive Disorder - Moderate (296.22) Total Time spent with patient: 30 minutes  Axis I: Substance Induced Mood Disorder  ADL's:  Intact  Sleep: Fair  Appetite:  Fair  Suicidal Ideation:  Plan:  denies Intent:  denies Means:  denies Homicidal Ideation:  Plan:  denies Intent:  denies Means:  denies AEB (as evidenced by):  Psychiatric Specialty Exam: Physical Exam  Review of Systems  Constitutional: Positive for malaise/fatigue.  HENT: Negative.   Eyes: Negative.   Respiratory: Negative.   Cardiovascular: Negative.   Gastrointestinal: Negative.   Genitourinary: Negative.   Musculoskeletal: Negative.   Skin: Negative.   Neurological: Negative.   Endo/Heme/Allergies: Negative.   Psychiatric/Behavioral: Positive for substance abuse. The patient is nervous/anxious.     Blood pressure 123/66, pulse 93, temperature 98.8 F (37.1 C), temperature source Oral, resp. rate 24, height 5\' 3"  (1.6 m), weight 73.483 kg (162 lb), SpO2 99.00%.Body mass index is 28.7 kg/(m^2).  General Appearance: Disheveled  Eye Sport and exercise psychologist::  Fair  Speech:  Clear and Coherent and not spontaneous  Volume:  Decreased  Mood:  Anxious, Depressed and worried,  tired  Affect:  Restricted  Thought Process:  Coherent and Goal Directed  Orientation:  Full (Time, Place, and Person)  Thought Content:  symptoms, events, worries, concerns  Suicidal Thoughts:  No  Homicidal Thoughts:  No  Memory:  Immediate;   Fair Recent;   Fair Remote;   Fair  Judgement:  Fair  Insight:  Present  Psychomotor Activity:  Restlessness  Concentration:  Fair  Recall:  [employer] of Knowledge:NA  Language: Fair  Akathisia:  No  Handed:    AIMS (if indicated):     Assets:  Desire for Improvement Housing  Sleep:  Number of Hours: 6   Musculoskeletal: Strength & Muscle Tone: within normal limits Gait & Station: normal Patient leans: N/A  Current Medications: Current Facility-Administered Medications  Medication Dose Route Frequency Provider Last Rate Last Dose  . acetaminophen (TYLENOL) tablet 650 mg  650 mg Oral Q6H PRN Laverle Hobby, PA-C      . alum & mag hydroxide-simeth (MAALOX/MYLANTA) 200-200-20 MG/5ML suspension 30 mL  30 mL Oral Q4H PRN Laverle Hobby, PA-C      . chlordiazePOXIDE (LIBRIUM) capsule 25 mg  25 mg Oral Q6H PRN Laverle Hobby, PA-C   25 mg at 04/04/14 1858  . chlordiazePOXIDE (LIBRIUM) capsule 25 mg  25 mg Oral BH-qamhs Laverle Hobby, PA-C       Followed by  . [START ON 04/07/2014] chlordiazePOXIDE (LIBRIUM) capsule 25 mg  25 mg Oral Daily Spencer E Simon, PA-C      . diphenoxylate-atropine (LOMOTIL) 2.5-0.025 MG per tablet 2 tablet  2 tablet Oral QID PRN Frederico Hamman  Elicia Lamp, PA-C   2 tablet at 04/05/14 [phone]  . escitalopram (LEXAPRO) tablet 20 mg  20 mg Oral Daily Laverle Hobby, PA-C   20 mg at 04/05/14 0845  . ezetimibe (ZETIA) tablet 10 mg  10 mg Oral Daily Laverle Hobby, PA-C   10 mg at 04/05/14 0845  . haloperidol (HALDOL) tablet 2 mg  2 mg Oral Q6H PRN Laverle Hobby, PA-C      . hydrOXYzine (ATARAX/VISTARIL) tablet 25 mg  25 mg Oral Q6H PRN Laverle Hobby, PA-C   25 mg at 04/04/14 2149  . insulin aspart (novoLOG) injection 0-15  Units  0-15 Units Subcutaneous TID WC Laverle Hobby, PA-C   5 Units at 04/04/14 1719  . insulin aspart (novoLOG) injection 0-5 Units  0-5 Units Subcutaneous QHS Laverle Hobby, PA-C   2 Units at 04/04/14 2149  . lisinopril (PRINIVIL,ZESTRIL) tablet 5 mg  5 mg Oral Daily Laverle Hobby, PA-C   5 mg at 04/05/14 0845  . magnesium hydroxide (MILK OF MAGNESIA) suspension 30 mL  30 mL Oral Daily PRN Laverle Hobby, PA-C      . multivitamin with minerals tablet 1 tablet  1 tablet Oral Daily Laverle Hobby, PA-C   1 tablet at 04/05/14 0845  . nebivolol (BYSTOLIC) tablet 5 mg  5 mg Oral Daily Laverle Hobby, PA-C   5 mg at 04/05/14 0845  . ondansetron (ZOFRAN-ODT) disintegrating tablet 4 mg  4 mg Oral Q6H PRN Laverle Hobby, PA-C   4 mg at 04/04/14 1858  . thiamine (B-1) injection 100 mg  100 mg Intramuscular Once [employer], PA-C      . thiamine (VITAMIN B-1) tablet 100 mg  100 mg Oral Daily Laverle Hobby, PA-C   100 mg at 04/05/14 0845  . traZODone (DESYREL) tablet 50 mg  50 mg Oral QHS,MR X 1 Laverle Hobby, PA-C        Lab Results:  Results for orders placed during the hospital encounter of 04/02/14 (from the past 48 hour(s))  GLUCOSE, CAPILLARY     Status: Abnormal   Collection Time    04/03/14  4:56 PM      Result Value Ref Range   Glucose-Capillary 159 (*) 70 - 99 mg/dL  GLUCOSE, CAPILLARY     Status: Abnormal   Collection Time    04/03/14  9:03 PM      Result Value Ref Range   Glucose-Capillary 205 (*) 70 - 99 mg/dL  GLUCOSE, CAPILLARY     Status: Abnormal   Collection Time    04/04/14  6:08 AM      Result Value Ref Range   Glucose-Capillary 165 (*) 70 - 99 mg/dL  GLUCOSE, CAPILLARY     Status: Abnormal   Collection Time    04/04/14 11:52 AM      Result Value Ref Range   Glucose-Capillary 248 (*) 70 - 99 mg/dL  GLUCOSE, CAPILLARY     Status: Abnormal   Collection Time    04/04/14  4:39 PM      Result Value Ref Range   Glucose-Capillary 220 (*) 70 - 99 mg/dL   GLUCOSE, CAPILLARY     Status: Abnormal   Collection Time    04/04/14  9:38 PM      Result Value Ref Range   Glucose-Capillary 216 (*) 70 - 99 mg/dL   Comment 1 Notify RN     Comment 2 Documented in Chart  GLUCOSE, CAPILLARY     Status: Abnormal   Collection Time    04/05/14  5:38 AM      Result Value Ref Range   Glucose-Capillary 147 (*) 70 - 99 mg/dL   Comment 1 Notify RN     Comment 2 Documented in Chart    GLUCOSE, CAPILLARY     Status: Abnormal   Collection Time    04/05/14 11:55 AM      Result Value Ref Range   Glucose-Capillary 169 (*) 70 - 99 mg/dL   Comment 1 Notify RN     Physical Findings: AIMS: Facial and Oral Movements Muscles of Facial Expression: None, normal Lips and Perioral Area: None, normal Jaw: None, normal Tongue: None, normal,Extremity Movements Upper (arms, wrists, hands, fingers): None, normal Lower (legs, knees, ankles, toes): None, normal, Trunk Movements Neck, shoulders, hips: None, normal, Overall Severity Severity of abnormal movements (highest score from questions above): None, normal Incapacitation due to abnormal movements: None, normal Patient's awareness of abnormal movements (rate only patient's report): No Awareness, Dental Status Current problems with teeth and/or dentures?: No Does patient usually wear dentures?: No  CIWA:  CIWA-Ar Total: 2 COWS:     Treatment Plan Summary: Daily contact with patient to assess and evaluate symptoms and progress in treatment Medication management  Plan: Supportive approach/coping skills/relapse prevention Complete the detox/address the co morbidities. Discharge plans in progress  Medical Decision Making Problem Points:  Review of psycho-social stressors (1) Data Points:  Review of medication regiment & side effects (2)  I certify that inpatient services furnished can reasonably be expected to improve the patient's condition.   Encarnacion Slates, PMHNP-BC 04/05/2014, 12:00 PM

## 2014-04-05 NOTE — BHH Group Notes (Signed)
[facility] LCSW Aftercare Discharge Planning Group Note   04/05/2014 11:57 AM  Participation Quality:  Active Engaged  Mood/Affect:  Appropriate  Depression Rating:  0  Anxiety Rating:  0  Thoughts of Suicide:  No Will you contract for safety?   Yes  Current AVH:  No  Plan for Discharge/Comments:  Arine reports she will be discharged on Saturday as her detox will be complete. Her plans remain to return to [facility].  She is open to getting a new PCP in which LCSW arranged.  No therapy wanted, she will follow up with previous therapist at [facility] when she wants too.   Transportation Means: family  Supports:  family  [place]

## 2014-04-05 NOTE — Progress Notes (Signed)
[name] is seen OOB UAL on the 300 hall today. [name] tolerates this well. [name] is quite friendly upon first approach to this Probation officer and says " I just want to get better so I can get out of here and go home"!!! [name] completes has morning self inventory and on it [name] writes [name] denies SI within the previous 24 hrs, [name] rates her depression and hopelessness "2/2" and says her DC plan is " moving back to support are:". R Safety is in place and poc moves forward.

## 2014-04-05 NOTE — BHH Group Notes (Signed)
[facility] LCSW Group Therapy  04/05/2014 2:44 PM  Type of Therapy:  Group Therapy  Participation Level:  Minimal  Participation Quality:  Limited  Affect:  Appropriate  Cognitive:  Alert and Oriented  Insight:  Limited  Engagement in Therapy:  Lacking  Modes of Intervention:  Discussion, Education, Exploration and Support  Summary of Progress/Problems:  Group today consisted of processing and education around the stages of change: precontemplation, contemplation, preparation, Action, Maintenance, and relapse.  Group first was educated about the different stages within the cycle and then processed barriers to change along with triggers associated with relapse.  [name] did not stay for entire group. She reports a feeling associated with barriers to change is fear. She did not elaborate and eventually left the group setting. Her mood was appropriate and affect was full, however patient disengaged in discussion.   [name] 04/05/2014, 2:44 PM

## 2014-04-05 NOTE — Progress Notes (Signed)
Pt is seen OOB UAL on the 300 hall today...she tolerates this well. [name] takes all of her medications as scheduled, she attends her groups as planned and she is engaged in her recovery as evidenced by the questions that she asks and the processing she does- here- tor yo to develop healtheir coping skills.  She completes her AM self inventory and on it she writes she denies SI within the previous 24 hrs, she rates her depression and hopelessness

## 2014-04-05 NOTE — Progress Notes (Signed)
D Jya hs had a difficult time today...she has been unable to reach a level of comfort, with current medication from her physician. She is vuisualy anxious, she is sad and depressed and upset about being served her divorce papers here in the hospital yesterday.   A She completes her self inventory and on it she writes she denies  SI within the  past 24 hrs, she rates her depression and hopelessness  "2/2" and says her DC plans are to "ove back to a supportive area ".   R Safety is in place and poc maintaiend.

## 2014-04-05 NOTE — Progress Notes (Addendum)
[facility] Adult Case Management Discharge Plan :  Will you be returning to the same living situation after discharge: Yes,  home At discharge, do you have transportation home?:Yes,  family Do you have the ability to pay for your medications:Yes,  no barriers per her report  Release of information consent forms completed and in the chart;  Patient's signature needed at discharge.  Patient to Follow up at: Follow-up Information   Follow up with [facility] On 04/18/2014. (Appointment is for new PCP for medicaiton managment.  Please arrive at 9:00am)    Contact information:   [place], [place] [phone]      Patient denies SI/HI:   Yes,  no reports    Safety Planning and Suicide Prevention discussed:  Yes,  no patient refused.  Patient to DC on Saturday per MD and patient's discussion within treatment team.  [name] 04/05/2014, 12:26 PM  Sheilah Pigeon, LCSW  04/06/2014 4:35 PM

## 2014-04-06 DIAGNOSIS — F1910 Other psychoactive substance abuse, uncomplicated: Secondary | ICD-10-CM

## 2014-04-06 DIAGNOSIS — F332 Major depressive disorder, recurrent severe without psychotic features: Secondary | ICD-10-CM

## 2014-04-06 LAB — GLUCOSE, CAPILLARY
Glucose-Capillary: 168 mg/dL — ABNORMAL HIGH (ref 70–99)
Glucose-Capillary: 265 mg/dL — ABNORMAL HIGH (ref 70–99)

## 2014-04-06 MED ORDER — ESCITALOPRAM OXALATE 20 MG PO TABS: 20.0000 mg | ORAL_TABLET | Freq: Every day | ORAL | Status: DC

## 2014-04-06 MED ORDER — EZETIMIBE 10 MG PO TABS: 10.0000 mg | ORAL_TABLET | Freq: Every day | ORAL | Status: DC

## 2014-04-06 MED ORDER — LORAZEPAM 1 MG PO TABS: 1.0000 mg | ORAL_TABLET | Freq: Four times a day (QID) | ORAL | Status: DC | PRN

## 2014-04-06 NOTE — BHH Suicide Risk Assessment (Signed)
Suicide Risk Assessment  Discharge Assessment     Demographic Factors:  Divorced or widowed and recent divorce  Total Time spent with patient: 45 minutes  Psychiatric Specialty Exam:     Blood pressure 150/71, pulse 89, temperature 98.7 F (37.1 C), temperature source Oral, resp. rate 20, height 5\' 3"  (1.6 m), weight 73.483 kg (162 lb), SpO2 99.00%.Body mass index is 28.7 kg/(m^2).  General Appearance: Casual  Eye Contact::  Fair  Speech:  Slow  Volume:  Decreased  Mood:  Euthymic  Affect:  Congruent  Thought Process:  Linear  Orientation:  Full (Time, Place, and Person)  Thought Content:  Rumination  Suicidal Thoughts:  No  Homicidal Thoughts:  No  Memory:  Recent;   Fair  Judgement:  Fair  Insight:  Fair  Psychomotor Activity:  Decreased  Concentration:  Fair  Recall:  [employer] of Knowledge:Fair  Language: Fair  Akathisia:  Negative  Handed:  Right  AIMS (if indicated):     Assets:  Resilience  Sleep:  Number of Hours: 5    Musculoskeletal: Strength & Muscle Tone: within normal limits Gait & Station: normal Patient leans: Front   Mental Status Per Nursing Assessment::   On Admission:     Current Mental Status by Physician: see MSE  Loss Factors: Loss of significant relationship  Historical Factors: Impulsivity  Risk Reduction Factors:   Positive coping skills or problem solving skills  Continued Clinical Symptoms:  Dysthymia Unstable or Poor Therapeutic Relationship  Cognitive Features That Contribute To Risk:  Closed-mindedness    Suicide Risk:  Minimal: No identifiable suicidal ideation.  Patients presenting with no risk factors but with morbid ruminations; may be classified as minimal risk based on the severity of the depressive symptoms  Discharge Diagnoses:   AXIS I:  Substance Induced Mood Disorder and Alcohol use disorder AXIS II:  Deferred AXIS III:   Past Medical History  Diagnosis Date  . Alcohol abuse   . Diabetes mellitus    . Hypertension   . Fibromyalgia   . Depression   . Carotid artery stenosis    AXIS IV:  other psychosocial or environmental problems AXIS V:  51-60 moderate symptoms  Plan Of Care/Follow-up recommendations:  Activity:  as tolerated Diet:  regular Follow up with outpatient provider and compliance with medications. See Discharge summary for details and medication.  Is patient on multiple antipsychotic therapies at discharge:  No   Has Patient had three or more failed trials of antipsychotic monotherapy by history:  No  Recommended Plan for Multiple Antipsychotic Therapies: NA    Merian Capron MD 04/06/2014, 9:43 AM

## 2014-04-06 NOTE — Discharge Summary (Signed)
Physician Discharge Summary Note  Patient:  [name] is an 68 y.o., female MRN:  [id number] DOB:  Mar 13, 1946 Patient phone:  [phone] (home)  Patient address:   [address] [place] [address],  Total Time spent with patient: 25 min Date of Admission:  04/02/2014 Date of Discharge: 04-06-14  Reason for Admission:  [name] is 68 years old, [name] admitted for alcohol dependence and detox.   Discharge Diagnoses: Active Problems:   Alcohol-induced mood disorder   Psychiatric Specialty Exam: Physical Exam  Constitutional: She is oriented to person, place, and time. She appears well-developed and well-nourished.  HENT:  Head: Normocephalic.  Neck: Normal range of motion.  Musculoskeletal: Normal range of motion.  Neurological: She is alert and oriented to person, place, and time.  Skin: Skin is warm and dry.    ROS  Blood pressure 150/71, pulse 89, temperature 98.7 F (37.1 C), temperature source Oral, resp. rate 20, height 5\' 3"  (1.6 m), weight 73.483 kg (162 lb), SpO2 99.00%.Body mass index is 28.7 kg/(m^2).  General Appearance: Fairly Groomed  Engineer, water::  Good  Speech:  Clear and Coherent  Volume:  Normal  Mood:  Depressed  Affect:  Congruent  Thought Process:  Linear  Orientation:  Full (Time, Place, and Person)  Thought Content:  NA  Suicidal Thoughts:  No  Homicidal Thoughts:  No  Memory:  Immediate;   Good Recent;   Good Remote;   Good  Judgement:  Fair  Insight:  Fair  Psychomotor Activity:  Normal  Concentration:  Fair  Recall:  Good  Fund of Knowledge:Good  Language: Good  Akathisia:  No  Handed:  Right  AIMS (if indicated):     Assets:  Communication Skills Desire for Improvement Housing Physical Health Social Support  Sleep:  Number of Hours: 5    Past Psychiatric History: Diagnosis:  Hospitalizations:  Outpatient Care:  Substance Abuse Care:  Self-Mutilation:  Suicidal Attempts:  Violent Behaviors:    Musculoskeletal: Strength & Muscle Tone: within normal limits Gait & Station: normal Patient leans: N/A  DSM5:  Substance/Addictive Disorders:  Alcohol Related Disorder - Severe (303.90) Depressive Disorders:  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 Alcohol abuse   . Diabetes mellitus   . Hypertension   . Fibromyalgia   . Depression   . Carotid artery stenosis    AXIS IV:  economic problems, other psychosocial or environmental problems, problems related to legal system/crime, problems with access to health care services and problems with primary support group AXIS V:  51-60 moderate symptoms  Level of Care:  OP  Hospital Course:   She has recently been going through a divorce after 30+ years of [facility] She has participated in group and individual therapies.  She is ready to dc today and "feelds better about the future".  [name] is 68 years old, [name] admitted for alcohol dependence and detox.  She will discharge today, her (husband) is picking her up and taking her to her home in [facility] is to attend as many AA meetings as she can get to, transportation is an issue.  She has a therapist Donnal Moat who she is considering seeing again.  She is spending time fixing up her home for sale.  Working on getting driver's license back, lost to  DUI 3 yrs ago. Denies SIHI, AVH  Consults:  None  Significant Diagnostic Studies:  labs: reviewed from ED  Discharge Vitals:   Blood pressure 150/71, pulse 89, temperature 98.7 F (37.1 C), temperature source Oral, resp. rate 20, height 5\' 3"  (1.6 m), weight 73.483 kg (162 lb), SpO2 99.00%. Body mass index is 28.7 kg/(m^2). Lab Results:   Results for orders placed during the hospital encounter of 04/02/14 (from the past 72 hour(s))  GLUCOSE, CAPILLARY      Status: Abnormal   Collection Time    04/03/14  4:56 PM      Result Value Ref Range   Glucose-Capillary 159 (*) 70 - 99 mg/dL  GLUCOSE, CAPILLARY     Status: Abnormal   Collection Time    04/03/14  9:03 PM      Result Value Ref Range   Glucose-Capillary 205 (*) 70 - 99 mg/dL  GLUCOSE, CAPILLARY     Status: Abnormal   Collection Time    04/04/14  6:08 AM      Result Value Ref Range   Glucose-Capillary 165 (*) 70 - 99 mg/dL  GLUCOSE, CAPILLARY     Status: Abnormal   Collection Time    04/04/14 11:52 AM      Result Value Ref Range   Glucose-Capillary 248 (*) 70 - 99 mg/dL  GLUCOSE, CAPILLARY     Status: Abnormal   Collection Time    04/04/14  4:39 PM      Result Value Ref Range   Glucose-Capillary 220 (*) 70 - 99 mg/dL  GLUCOSE, CAPILLARY     Status: Abnormal   Collection Time    04/04/14  9:38 PM      Result Value Ref Range   Glucose-Capillary 216 (*) 70 - 99 mg/dL   Comment 1 Notify RN     Comment 2 Documented in Chart    GLUCOSE, CAPILLARY     Status: Abnormal   Collection Time    04/05/14  5:38 AM      Result Value Ref Range   Glucose-Capillary 147 (*) 70 - 99 mg/dL   Comment 1 Notify RN     Comment 2 Documented in Chart    GLUCOSE, CAPILLARY     Status: Abnormal   Collection Time    04/05/14 11:55 AM      Result Value Ref Range   Glucose-Capillary 169 (*) 70 - 99 mg/dL   Comment 1 Notify RN    GLUCOSE, CAPILLARY     Status: Abnormal   Collection Time    04/05/14  5:31 PM      Result Value Ref Range   Glucose-Capillary 241 (*) 70 - 99 mg/dL  GLUCOSE, CAPILLARY     Status: Abnormal   Collection Time    04/05/14  9:15 PM      Result Value Ref Range   Glucose-Capillary 217 (*) 70 - 99 mg/dL  GLUCOSE, CAPILLARY     Status: Abnormal   Collection Time    04/06/14  6:03 AM      Result Value Ref Range   Glucose-Capillary 168 (*) 70 - 99 mg/dL  GLUCOSE, CAPILLARY     Status: Abnormal   Collection Time    04/06/14 11:24 AM      Result Value Ref Range    Glucose-Capillary 265 (*) 70 - 99 mg/dL   Comment 1 Notify RN      Physical Findings: AIMS: Facial and Oral Movements Muscles of Facial Expression: None, normal Lips  and Perioral Area: None, normal Jaw: None, normal Tongue: None, normal,Extremity Movements Upper (arms, wrists, hands, fingers): None, normal Lower (legs, knees, ankles, toes): None, normal, Trunk Movements Neck, shoulders, hips: None, normal, Overall Severity Severity of abnormal movements (highest score from questions above): None, normal Incapacitation due to abnormal movements: None, normal Patient's awareness of abnormal movements (rate only patient's report): No Awareness, Dental Status Current problems with teeth and/or dentures?: No Does patient usually wear dentures?: No  CIWA:  CIWA-Ar Total: 1 COWS:     Psychiatric Specialty Exam: See Psychiatric Specialty Exam and Suicide Risk Assessment completed by Attending Physician prior to discharge.  Discharge destination:  Home  Is patient on multiple antipsychotic therapies at discharge:  No   Has Patient had three or more failed trials of antipsychotic monotherapy by history:  No  Recommended Plan for Multiple Antipsychotic Therapies: NA   Future Appointments Provider Department Dept Phone   04/18/2014 9:00 AM [facility] Provider [facility] [phone]       Medication List    STOP taking these medications       amphetamine-dextroamphetamine 30 MG 24 hr capsule  Commonly known as:  ADDERALL XR     EPINEPHrine 0.3 mg/0.3 mL Soaj injection  Commonly known as:  EPIPEN     fish oil-omega-3 fatty acids 1000 MG capsule     OLANZapine 5 MG tablet  Commonly known as:  ZYPREXA     vitamin E 1000 UNIT capsule      TAKE these medications     Indication   diphenoxylate-atropine 2.5-0.025 MG per tablet  Commonly known as:  LOMOTIL  TAKE 1 TABLET BY MOUTH 4 TIMES DAILY AS NEEDED FOR DIARRHEA OR LOOSE STOOLS.       escitalopram 20 MG tablet  Commonly known as:  LEXAPRO  Take 1 tablet (20 mg total) by mouth daily.   Indication:  Depression, Generalized Anxiety Disorder     ezetimibe 10 MG tablet  Commonly known as:  ZETIA  Take 1 tablet (10 mg total) by mouth daily. For cholesterol control   Indication:  Inherited Heterozygous Hypercholesterolemia, Inherited Homozygous Hypercholesterolemia, Elevation of Both Cholesterol and Triglycerides in Blood     lisinopril 5 MG tablet  Commonly known as:  PRINIVIL,ZESTRIL  Take 1 tablet (5 mg total) by mouth daily. For hypertension   Indication:  High Blood Pressure     LORazepam 1 MG tablet  Commonly known as:  ATIVAN  Take 1 tablet (1 mg total) by mouth every 6 (six) hours as needed for anxiety.   Indication:  Feeling Anxious     nebivolol 5 MG tablet  Commonly known as:  BYSTOLIC  Take 1 tablet (5 mg total) by mouth daily. For hypertension   Indication:  High Blood Pressure           Follow-up Information   Follow up with [facility] On 04/18/2014. (Appointment is for new PCP for medicaiton managment.  Please arrive at 9:00am)    Contact information:   [place], [place] [phone]      Follow-up recommendations:  Activity:  as tolerated Diet:  heart healthy  Comments:  Discussed with patient importance of relationship, life purpose, possibility of AA and reaching out for help when needed.  Encouraged to reconnect with her therapist  Total Discharge Time:  25 min Signed:  Knox Royalty  PMHNP 04/06/2014, 1:51 PM I have examined the patient and agree  with the discharge plan and findings

## 2014-04-06 NOTE — Progress Notes (Signed)
Patient ID: [name], female   DOB: 1946/07/26, 68 y.o.   MRN: [id number] D)  Has been superficially bright and laughing, but admits to being anxious , stated her husband had had their divorce papers delivered to the unit yesterday and she requested ativan before she began to read them.  Attended AA group this evening, came to med window afterward for "whatever else she could have."  CBG was 217, received 2 units novalog per SSI schedule.   A)  Will continue to monitor for safety, continue POC R)  Safety maintained.

## 2014-04-06 NOTE — Progress Notes (Signed)
D) Pt being discharged to home accompanied by her husband. Mood and affect are appropriate. Pt. Denies SI and HI. A) all belongings returned to Pt. All discharge plans explained to Pt. Along with her medications. Pt given support, reassurance and praise. Pt given some samples to use until she can get to her doctor. R) Denies SI and HI.

## 2014-04-06 NOTE — [facility] Group Notes (Signed)
[facility] LCSW Group Therapy Note  04/06/2014/10 AM  Type of Therapy and Topic:  Group Therapy: Avoiding Self-Sabotaging and Enabling Behaviors  Participation Level:  Active   Mood: Appropriate  Description of Group:     Learn how to identify obstacles, self-sabotaging and enabling behaviors, what are they, why do we do them and what needs do these behaviors meet? Discuss unhealthy relationships and how to have positive healthy boundaries with those that sabotage and enable. Explore aspects of self-sabotage and enabling in yourself and how to limit these self-destructive behaviors in everyday life.  Therapeutic Goals: 1. Patient will identify one obstacle that relates to self-sabotage and enabling behaviors 2. Patient will identify one personal self-sabotaging or enabling behavior they did prior to admission 3. Patient able to establish a plan to change the above identified behavior they did prior to admission:  4. Patient will demonstrate ability to communicate their needs through discussion and/or role plays.   Summary of Patient Progress: The main focus of today's process group was to explain to the adolescent what "self-sabotage" means and use Motivational Interviewing to discuss what benefits, negative or positive, were involved in a self-identified self-sabotaging behavior. We then talked about stages of change flow chart and each patient identified where they were along with their self sabotaging behavior if any. [name] shared during warm up that she is looking forward to DC today and returning [place] once her divorce here is finalized. Patient sees herself in action stage and reports self sabotaging behavior of procrastination and shame and guilt often get in her way.    Therapeutic Modalities:   Cognitive Behavioral Therapy Person-Centered Therapy Motivational Interviewing   Sheilah Pigeon, LCSW

## 2014-04-10 NOTE — Progress Notes (Signed)
Patient Discharge Instructions:  Next Level Care Provider Has Access to the EMR, 04/10/14 Records provided to [facility] via [facility]/E[facility] access.  Patsey Berthold, 04/10/2014, 3:45 PM

## 2014-04-18 ENCOUNTER — Ambulatory Visit: Payer: 59 | Attending: Internal Medicine | Admitting: Internal Medicine

## 2014-04-18 ENCOUNTER — Encounter: Payer: Self-pay | Admitting: Internal Medicine

## 2014-04-18 DIAGNOSIS — E119 Type 2 diabetes mellitus without complications: Secondary | ICD-10-CM

## 2014-04-18 DIAGNOSIS — F329 Major depressive disorder, single episode, unspecified: Secondary | ICD-10-CM | POA: Insufficient documentation

## 2014-04-18 DIAGNOSIS — Z79899 Other long term (current) drug therapy: Secondary | ICD-10-CM | POA: Insufficient documentation

## 2014-04-18 DIAGNOSIS — F3289 Other specified depressive episodes: Secondary | ICD-10-CM | POA: Insufficient documentation

## 2014-04-18 DIAGNOSIS — E785 Hyperlipidemia, unspecified: Secondary | ICD-10-CM | POA: Insufficient documentation

## 2014-04-18 DIAGNOSIS — I6529 Occlusion and stenosis of unspecified carotid artery: Secondary | ICD-10-CM | POA: Insufficient documentation

## 2014-04-18 DIAGNOSIS — IMO0001 Reserved for inherently not codable concepts without codable children: Secondary | ICD-10-CM | POA: Insufficient documentation

## 2014-04-18 DIAGNOSIS — I10 Essential (primary) hypertension: Secondary | ICD-10-CM | POA: Insufficient documentation

## 2014-04-18 DIAGNOSIS — Z91199 Patient's noncompliance with other medical treatment and regimen due to unspecified reason: Secondary | ICD-10-CM | POA: Insufficient documentation

## 2014-04-18 DIAGNOSIS — F1010 Alcohol abuse, uncomplicated: Secondary | ICD-10-CM

## 2014-04-18 DIAGNOSIS — F411 Generalized anxiety disorder: Secondary | ICD-10-CM

## 2014-04-18 DIAGNOSIS — Z87891 Personal history of nicotine dependence: Secondary | ICD-10-CM | POA: Insufficient documentation

## 2014-04-18 DIAGNOSIS — Z9119 Patient's noncompliance with other medical treatment and regimen: Secondary | ICD-10-CM | POA: Insufficient documentation

## 2014-04-18 LAB — BASIC METABOLIC PANEL
BUN: 16 mg/dL (ref 6–23)
CO2: 28 mEq/L (ref 19–32)
Calcium: 10 mg/dL (ref 8.4–10.5)
Chloride: 98 mEq/L (ref 96–112)
Creat: 0.8 mg/dL (ref 0.50–1.10)
Glucose, Bld: 169 mg/dL — ABNORMAL HIGH (ref 70–99)
Potassium: 4 mEq/L (ref 3.5–5.3)
Sodium: 138 mEq/L (ref 135–145)

## 2014-04-18 LAB — LIPID PANEL
Cholesterol: 202 mg/dL — ABNORMAL HIGH (ref 0–200)
HDL: 50 mg/dL (ref >39)
LDL Cholesterol: 135 mg/dL — ABNORMAL HIGH (ref 0–99)
Total CHOL/HDL Ratio: 4 Ratio
Triglycerides: 85 mg/dL (ref <150)
VLDL: 17 mg/dL (ref 0–40)

## 2014-04-18 LAB — TSH: TSH: 3.375 u[IU]/mL (ref 0.350–4.500)

## 2014-04-18 MED ORDER — LORAZEPAM 1 MG PO TABS: 1.0000 mg | ORAL_TABLET | Freq: Four times a day (QID) | ORAL | Status: DC | PRN

## 2014-04-18 MED ORDER — LISINOPRIL 5 MG PO TABS: 5.0000 mg | ORAL_TABLET | Freq: Every day | ORAL | Status: DC

## 2014-04-18 MED ORDER — SITAGLIPTIN PHOSPHATE 25 MG PO TABS: 25.0000 mg | ORAL_TABLET | Freq: Every day | ORAL | Status: DC

## 2014-04-18 MED ORDER — METFORMIN HCL 500 MG PO TABS: 750.0000 mg | ORAL_TABLET | Freq: Every day | ORAL | Status: DC

## 2014-04-18 MED ORDER — EZETIMIBE 10 MG PO TABS: 10.0000 mg | ORAL_TABLET | Freq: Every day | ORAL | Status: DC

## 2014-04-18 MED ORDER — NEBIVOLOL HCL 5 MG PO TABS: 5.0000 mg | ORAL_TABLET | Freq: Every day | ORAL | Status: DC

## 2014-04-18 NOTE — Patient Instructions (Signed)
Hypertension As your heart beats, it forces blood through your arteries. This force is your blood pressure. If the pressure is too high, it is called hypertension (HTN) or high blood pressure. HTN is dangerous because you may have it and not know it. High blood pressure may mean that your heart has to work harder to pump blood. Your arteries may be narrow or stiff. The extra work puts you at risk for heart disease, stroke, and other problems.  Blood pressure consists of two numbers, a higher number over a lower, 110/72, for example. It is stated as "110 over 72." The ideal is below 120 for the top number (systolic) and under 80 for the bottom (diastolic). Write down your blood pressure today. You should pay close attention to your blood pressure if you have certain conditions such as:  Heart failure.  Prior heart attack.  Diabetes  Chronic kidney disease.  Prior stroke.  Multiple risk factors for heart disease. To see if you have HTN, your blood pressure should be measured while you are seated with your arm held at the level of the heart. It should be measured at least twice. A one-time elevated blood pressure reading (especially in the Emergency Department) does not mean that you need treatment. There may be conditions in which the blood pressure is different between your right and left arms. It is important to see your caregiver soon for a recheck. Most people have essential hypertension which means that there is not a specific cause. This type of high blood pressure may be lowered by changing lifestyle factors such as:  Stress.  Smoking.  Lack of exercise.  Excessive weight.  Drug/tobacco/alcohol use.  Eating less salt. Most people do not have symptoms from high blood pressure until it has caused damage to the body. Effective treatment can often prevent, delay or reduce that damage. TREATMENT  When a cause has been identified, treatment for high blood pressure is directed at the  cause. There are a large number of medications to treat HTN. These fall into several categories, and your caregiver will help you select the medicines that are best for you. Medications may have side effects. You should review side effects with your caregiver. If your blood pressure stays high after you have made lifestyle changes or started on medicines,   Your medication(s) may need to be changed.  Other problems may need to be addressed.  Be certain you understand your prescriptions, and know how and when to take your medicine.  Be sure to follow up with your caregiver within the time frame advised (usually within two weeks) to have your blood pressure rechecked and to review your medications.  If you are taking more than one medicine to lower your blood pressure, make sure you know how and at what times they should be taken. Taking two medicines at the same time can result in blood pressure that is too low. SEEK IMMEDIATE MEDICAL CARE IF:  You develop a severe headache, blurred or changing vision, or confusion.  You have unusual weakness or numbness, or a faint feeling.  You have severe chest or abdominal pain, vomiting, or breathing problems. MAKE SURE YOU:   Understand these instructions.  Will watch your condition.  Will get help right away if you are not doing well or get worse. Document Released: 11/29/2005 Document Revised: 02/21/2012 Document Reviewed: 07/19/2008 [facility] Patient Information 2014 [place]. Diabetes and Small Vessel Disease Small vessel disease (microvascular disease) includes nephropathy, retinopathy, and neuropathy. People  with diabetes are at risk for these problems, but keeping blood glucose (sugar) controlled is helpful in preventing problems. DIABETIC KIDNEY PROBLEMS (DIABETIC NEPHROPATHY)  Diabetic nephropathy occurs in many patients with diabetes.  Damage to the small vessels in the kidneys is the leading cause of end-stage renal disease  (ESRD).  Protein in the urine (albuminuria) in the range of 30 to 300 mg/24 h (microalbuminuria) is a sign of the earliest stage of diabetic nephropathy.  Good blood glucose (sugar) and blood pressure control significantly reduce the progression of nephropathy. DIABETIC EYE PROBLEMS (DIABETIC RETINOPATHY)  Diabetic retinopathy is the most common cause of new cases of blindness in adults. It is related to the number of years you have had diabetes.  Common risk factors include high blood sugar (hyperglycemia), high blood pressure (hypertension), and poorly controlled blood lipids such as high blood cholesterol (hypercholesterolemia). DIABETIC NERVE PROBLEMS (DIABETIC NEUROPATHY) Diabetic neuropathy is the most common, long-term complication of diabetes. It is responsible for more than half of leg amputations not due to accidents. The main risk for developing diabetic neuropathy seems to be uncontrolled blood sugars. Hyperglycemia damages the nerve fibers causing sensation (feeling) problems. The closer you can keep the following guidelines, the better chance you will have avoiding problems from small vessel disease.  Working toward near normal blood glucose or as normal as possible. You will need to keep your blood glucose and A1c at the target range prescribed by your caregiver.  Keep your blood pressure less than 120/80.  Keep your low-density lipoprotein (LDL) cholesterol (one of the fats in your blood) at less than 100 mg/dL. An LDL less than 70 mg/dL may be recommended for high risk patients. You cannot change your family history, but it is important to change the risk factors that you can. Risk factors you can control include:  Controlling high blood pressure.  Stopping smoking.  Using alcohol only in moderation. Generally, this means about one drink per day for women and two drinks per day for men.  Controlling your blood lipids (cholesterol and triglycerides).  Treating heart  problems, if these are contributing to risk. SEEK MEDICAL CARE IF:   You are having problems keeping your blood glucose in goal range.  You notice a change in your vision or new problems with your vision.  You have wound or sore that does not heal.  Your blood pressure is above the target range. Document Released: 12/02/2003 Document Revised: 11/15/2012 Document Reviewed: 05/09/2009 [facility] Patient Information 2014 [place], [place]. Blood Glucose Monitoring, Adult Monitoring your blood glucose (also know as blood sugar) helps you to manage your diabetes. It also helps you and your health care provider monitor your diabetes and determine how well your treatment plan is working. WHY SHOULD YOU MONITOR YOUR BLOOD GLUCOSE?  It can help you understand how food, exercise, and medicine affect your blood glucose.  It allows you to know what your blood glucose is at any given moment. You can quickly tell if you are having low blood glucose (hypoglycemia) or high blood glucose (hyperglycemia).  It can help you and your health care provider know how to adjust your medicines.  It can help you understand how to manage an illness or adjust medicine for exercise. WHEN SHOULD YOU TEST? Your health care provider will help you decide how often you should check your blood glucose. This may depend on the type of diabetes you have, your diabetes control, or the types of medicines you are taking. Be sure to write  down all of your blood glucose readings so that this information can be reviewed with your health care provider. See below for examples of testing times that your health care provider may suggest. Type 1 Diabetes  Test 4 times a day if you are in good control, using an insulin pump, or perform multiple daily injections.  If your diabetes is not well-controlled or if you are sick, you may need to monitor more often.  It is a good idea to also monitor:  Before and after exercise.  Between meals  and 2 hours after a meal.  Occasionally between 2:00 to 3:00 am. Type 2 Diabetes  It can vary with each person, but generally, if you are on insulin, test 4 times a day.  If you take medicines by mouth (orally), test 2 times a day.  If you are on a controlled diet, test once a day.  If your diabetes is not well controlled or if you are sick, you may need to monitor more often. HOW TO MONITOR YOUR BLOOD GLUCOSE Supplies Needed  Blood glucose meter.  Test strips for your meter. Each meter has its own strips. You must use the strips that go with your own meter.  A pricking needle (lancet).  A device that holds the lancet (lancing device).  A journal or log book to write down your results. Procedure  Wash your hands with soap and water. Alcohol is not preferred.  Prick the side of your finger (not the tip) with the lancet.  Gently milk the finger until a small drop of blood appears.  Follow the instructions that come with your meter for inserting the test strip, applying blood to the strip, and using your blood glucose meter. Other Areas to Get Blood for Testing Some meters allow you to use other areas of your body (other than your finger) to test your blood. These areas are called alternative sites. The most common alternative sites are:  The forearm.  The thigh.  The back area of the lower leg.  The palm of the hand. The blood flow in these areas is slower. Therefore, the blood glucose values you get may be delayed, and the numbers are different from what you would get from your fingers. Do not use alternative sites if you think you are having hypoglycemia. Your reading will not be accurate. Always use a finger if you are having hypoglycemia. Also, if you cannot feel your lows (hypoglycemia unawareness), always use your fingers for your blood glucose checks. ADDITIONAL TIPS FOR GLUCOSE MONITORING  Do not reuse lancets.  Always carry your supplies with you.  All blood  glucose meters have a 24-hour "hotline" number to call if you have questions or need help.  Adjust (calibrate) your blood glucose meter with a control solution after finishing a few boxes of strips. BLOOD GLUCOSE RECORD KEEPING It is a good idea to keep a daily record or log of your blood glucose readings. Most glucose meters, if not all, keep your glucose records stored in the meter. Some meters come with the ability to download your records to your home computer. Keeping a record of your blood glucose readings is especially helpful if you are wanting to look for patterns. Make notes to go along with the blood glucose readings because you might forget what happened at that exact time. Keeping good records helps you and your health care provider to work together to achieve good diabetes management.  Document Released: 12/02/2003 Document Revised: 08/01/2013 Document  Reviewed: 04/23/2013 [employer] Patient Information 2014 [place], [place].

## 2014-04-18 NOTE — Progress Notes (Signed)
Patient ID: [name], female   DOB: 1946/07/29, 68 y.o.   MRN: [id number]  CC: Followup  HPI: 68 year old female with past medical history of alcohol abuse, diabetes, noncompliance with medications at home secondary to alcohol abuse, hypertension, anxiety and depression and dyslipidemia who presented to clinic for followup after recent hospitalization in behavioral [facility]. Patient has no current complaints at this time. She has no chest pain or shortness of breath. No complaints of blurry vision or polyuria or polydipsia.   Allergies  Allergen Reactions  . Other Anaphylaxis    shellfish  . Latex Other (See Comments)    Break outs  . Lipitor [Atorvastatin Calcium] Other (See Comments)    Muscle aches  . Shellfish Allergy   . Codeine Rash    And nausea   . Pentazocine Lactate Rash  . Sulfonamide Derivatives Rash   Past Medical History  Diagnosis Date  . Alcohol abuse   . Diabetes mellitus   . Hypertension   . Fibromyalgia   . Depression   . Carotid artery stenosis    Current Outpatient Prescriptions on File Prior to Visit  Medication Sig Dispense Refill  . escitalopram (LEXAPRO) 20 MG tablet Take 1 tablet (20 mg total) by mouth daily.  30 tablet  0  . diphenoxylate-atropine (LOMOTIL) 2.5-0.025 MG per tablet TAKE 1 TABLET BY MOUTH 4 TIMES DAILY AS NEEDED FOR DIARRHEA OR LOOSE STOOLS.  30 tablet  0   No current facility-administered medications on file prior to visit.   Family History  Problem Relation Age of Onset  . Cancer Mother   . Heart disease Father   . Heart disease Sister    History   Social History  . Marital Status: Married    Spouse Name: N/A    Number of Children: N/A  . Years of Education: N/A   Occupational History  . Not on file.   Social History Main Topics  . Smoking status: Former Smoker    Quit date: 03/11/2010  . Smokeless tobacco: Never Used  . Alcohol Use: Yes     Comment: 4-5 bottles of wine per day  . Drug Use: No   Comment: Pt denies   . Sexual Activity: No   Other Topics Concern  . Not on file   Social History Narrative  . No narrative on file    Review of Systems  Constitutional: Negative for fever, chills, diaphoresis, activity change, appetite change and fatigue.  HENT: Negative for ear pain, nosebleeds, congestion, facial swelling, rhinorrhea, neck pain, neck stiffness and ear discharge.   Eyes: Negative for pain, discharge, redness, itching and visual disturbance.  Respiratory: Negative for cough, choking, chest tightness, shortness of breath, wheezing and stridor.   Cardiovascular: Negative for chest pain, palpitations and leg swelling.  Gastrointestinal: Negative for abdominal distention.  Genitourinary: Negative for dysuria, urgency, frequency, hematuria, flank pain, decreased urine volume, difficulty urinating and dyspareunia.  Musculoskeletal: Negative for back pain, joint swelling, arthralgias and gait problem.  Neurological: Negative for dizziness, tremors, seizures, syncope, facial asymmetry, speech difficulty, weakness, light-headedness, numbness and headaches.  Hematological: Negative for adenopathy. Does not bruise/bleed easily.  Psychiatric/Behavioral: Negative for hallucinations, behavioral problems, confusion, dysphoric mood, decreased concentration and agitation.    Objective:  There were no vitals filed for this visit.  Physical Exam  Constitutional: Appears well-developed and well-nourished. No distress.  HENT: Normocephalic. External right and left ear normal. Oropharynx is clear and moist.  Eyes: Conjunctivae and EOM are normal. PERRLA, no scleral  icterus.  Neck: Normal ROM. Neck supple. No JVD. No tracheal deviation. No thyromegaly.  CVS: RRR, S1/S2 +, no murmurs, no gallops, no carotid bruit.  Pulmonary: Effort and breath sounds normal, no stridor, rhonchi, wheezes, rales.  Abdominal: Soft. BS +,  no distension, tenderness, rebound or guarding.  Musculoskeletal:  Normal range of motion. No edema and no tenderness.  Lymphadenopathy: No lymphadenopathy noted, cervical, inguinal. Neuro: Alert. Normal reflexes, muscle tone coordination. No cranial nerve deficit. Skin: Skin is warm and dry. No rash noted. Not diaphoretic. No erythema. No pallor.  Psychiatric: Normal mood and affect. Behavior, judgment, thought content normal.   Lab Results  Component Value Date   WBC 3.7* 04/02/2014   HGB 14.0 04/02/2014   HCT 40.2 04/02/2014   MCV 93.3 04/02/2014   PLT 210 04/02/2014   Lab Results  Component Value Date   CREATININE 0.58 04/02/2014   BUN 12 04/02/2014   NA 136* 04/02/2014   K 3.9 04/02/2014   CL 89* 04/02/2014   CO2 21 04/02/2014    Lab Results  Component Value Date   HGBA1C 11.0* 04/03/2014   Lipid Panel     Component Value Date/Time   CHOL 270* 11/11/2011 0448   TRIG 329* 11/11/2011 0448   HDL 69 11/11/2011 0448   CHOLHDL 3.9 11/11/2011 0448   VLDL 66* 11/11/2011 0448   LDLCALC 135* 11/11/2011 0448       Assessment and plan:   Patient Active Problem List   Diagnosis Date Noted  . Alcohol abuse, continuous 05/29/2012    Priority: Medium - Patient recently hospitalized in behavioral health [facility]. She will seek outpatient treatment as well. She is adamant that she will not use alcohol again.  Marland Kitchen DIABETES MELLITUS, TYPE II 08/20/2007    Priority: Medium - A1c in April 2015 is 11 indicating poor glycemic control  - Endocrinology referral provided  - Started metformin 750 mg at bedtime and Januvia 25 mg daily which is per previous home regimen.   Marland Kitchen HYPERLIPIDEMIA 08/20/2007    Priority: Medium - Check lipid panel today. Continue statin therapy   . ANXIETY 08/20/2007    Priority: Medium - Lorazepam as needed   . DEPRESSION 08/20/2007    Priority: Medium - Continue escitalopram which is being prescribed by psychiatrist   . HYPERTENSION 08/20/2007    Priority: Medium - We have discussed target BP range - I have advised pt to check BP  regularly and to call us back if the numbers are higher than 140/90 - discussed the importance of compliance with medical therapy and diet  - Continue lisinopril and nebivolol - Check kidney function today and TSH

## 2014-04-18 NOTE — Progress Notes (Signed)
Pt here as HFU- Alcohol Abuse s/p detox withdrawal from [facility] 2 weeks ago Pt states the last drink was prior to detox Taking all medications daily, hand tremors noted

## 2014-05-09 ENCOUNTER — Encounter: Payer: Self-pay | Admitting: *Deleted

## 2014-05-16 ENCOUNTER — Encounter: Payer: Self-pay | Admitting: Endocrinology

## 2014-05-29 ENCOUNTER — Other Ambulatory Visit: Payer: Self-pay | Admitting: Internal Medicine

## 2014-05-31 ENCOUNTER — Other Ambulatory Visit: Payer: Self-pay | Admitting: Internal Medicine

## 2014-06-08 ENCOUNTER — Encounter (HOSPITAL_COMMUNITY): Payer: Self-pay | Admitting: Emergency Medicine

## 2014-06-08 ENCOUNTER — Inpatient Hospital Stay (HOSPITAL_COMMUNITY)
Admission: EM | Admit: 2014-06-08 | Discharge: 2014-06-10 | DRG: 897 | Disposition: A | Discharge status: Home / Self Care | Payer: 59 | Attending: Internal Medicine | Admitting: Internal Medicine

## 2014-06-08 DIAGNOSIS — IMO0001 Reserved for inherently not codable concepts without codable children: Secondary | ICD-10-CM | POA: Diagnosis present

## 2014-06-08 DIAGNOSIS — I10 Essential (primary) hypertension: Secondary | ICD-10-CM | POA: Diagnosis present

## 2014-06-08 DIAGNOSIS — F1020 Alcohol dependence, uncomplicated: Secondary | ICD-10-CM | POA: Diagnosis present

## 2014-06-08 DIAGNOSIS — F10939 Alcohol use, unspecified with withdrawal, unspecified: Secondary | ICD-10-CM

## 2014-06-08 DIAGNOSIS — F411 Generalized anxiety disorder: Secondary | ICD-10-CM | POA: Diagnosis present

## 2014-06-08 DIAGNOSIS — F10239 Alcohol dependence with withdrawal, unspecified: Secondary | ICD-10-CM

## 2014-06-08 DIAGNOSIS — F1010 Alcohol abuse, uncomplicated: Secondary | ICD-10-CM

## 2014-06-08 DIAGNOSIS — F3289 Other specified depressive episodes: Secondary | ICD-10-CM | POA: Diagnosis present

## 2014-06-08 DIAGNOSIS — F10231 Alcohol dependence with withdrawal delirium: Secondary | ICD-10-CM | POA: Diagnosis present

## 2014-06-08 DIAGNOSIS — F10931 Alcohol use, unspecified with withdrawal delirium: Secondary | ICD-10-CM | POA: Diagnosis present

## 2014-06-08 DIAGNOSIS — E871 Hypo-osmolality and hyponatremia: Secondary | ICD-10-CM

## 2014-06-08 DIAGNOSIS — E785 Hyperlipidemia, unspecified: Secondary | ICD-10-CM | POA: Diagnosis present

## 2014-06-08 DIAGNOSIS — E119 Type 2 diabetes mellitus without complications: Secondary | ICD-10-CM | POA: Diagnosis present

## 2014-06-08 DIAGNOSIS — Z87891 Personal history of nicotine dependence: Secondary | ICD-10-CM | POA: Diagnosis not present

## 2014-06-08 DIAGNOSIS — Z8249 Family history of ischemic heart disease and other diseases of the circulatory system: Secondary | ICD-10-CM | POA: Diagnosis not present

## 2014-06-08 DIAGNOSIS — F10932 Alcohol use, unspecified with withdrawal with perceptual disturbance: Secondary | ICD-10-CM

## 2014-06-08 DIAGNOSIS — F329 Major depressive disorder, single episode, unspecified: Secondary | ICD-10-CM | POA: Diagnosis present

## 2014-06-08 DIAGNOSIS — F10232 Alcohol dependence with withdrawal with perceptual disturbance: Secondary | ICD-10-CM

## 2014-06-08 LAB — COMPREHENSIVE METABOLIC PANEL
ALT: 17 U/L (ref 0–35)
AST: 27 U/L (ref 0–37)
Albumin: 4 g/dL (ref 3.5–5.2)
Alkaline Phosphatase: 96 U/L (ref 39–117)
BUN: 15 mg/dL (ref 6–23)
CO2: 19 mEq/L (ref 19–32)
Calcium: 8.6 mg/dL (ref 8.4–10.5)
Chloride: 92 mEq/L — ABNORMAL LOW (ref 96–112)
Creatinine, Ser: 0.54 mg/dL (ref 0.50–1.10)
GFR calc Af Amer: >90 mL/min (ref >90)
GFR calc non Af Amer: >90 mL/min (ref >90)
Glucose, Bld: 198 mg/dL — ABNORMAL HIGH (ref 70–99)
Potassium: 4 mEq/L (ref 3.7–5.3)
Sodium: 135 mEq/L — ABNORMAL LOW (ref 137–147)
Total Bilirubin: 0.7 mg/dL (ref 0.3–1.2)
Total Protein: 7.9 g/dL (ref 6.0–8.3)

## 2014-06-08 LAB — ETHANOL: Alcohol, Ethyl (B): 288 mg/dL — ABNORMAL HIGH (ref 0–11)

## 2014-06-08 LAB — CBC WITH DIFFERENTIAL/PLATELET
Basophils Absolute: 0 10*3/uL (ref 0.0–0.1)
Basophils Relative: 1 % (ref 0–1)
Eosinophils Absolute: 0 10*3/uL (ref 0.0–0.7)
Eosinophils Relative: 1 % (ref 0–5)
HCT: 41.4 % (ref 36.0–46.0)
Hemoglobin: 14.2 g/dL (ref 12.0–15.0)
Lymphocytes Relative: 52 % — ABNORMAL HIGH (ref 12–46)
Lymphs Abs: 3.2 10*3/uL (ref 0.7–4.0)
MCH: 31.8 pg (ref 26.0–34.0)
MCHC: 34.3 g/dL (ref 30.0–36.0)
MCV: 92.8 fL (ref 78.0–100.0)
Monocytes Absolute: 0.3 10*3/uL (ref 0.1–1.0)
Monocytes Relative: 5 % (ref 3–12)
Neutro Abs: 2.5 10*3/uL (ref 1.7–7.7)
Neutrophils Relative %: 41 % — ABNORMAL LOW (ref 43–77)
Platelets: 178 10*3/uL (ref 150–400)
RBC: 4.46 MIL/uL (ref 3.87–5.11)
RDW: 15.2 % (ref 11.5–15.5)
WBC: 6.1 10*3/uL (ref 4.0–10.5)

## 2014-06-08 LAB — RAPID URINE DRUG SCREEN, HOSP PERFORMED
Amphetamines: NOT DETECTED
Barbiturates: NOT DETECTED
Benzodiazepines: NOT DETECTED
Cocaine: NOT DETECTED
Opiates: NOT DETECTED
Tetrahydrocannabinol: NOT DETECTED

## 2014-06-08 LAB — SALICYLATE LEVEL: Salicylate Lvl: <2 mg/dL — ABNORMAL LOW (ref 2.8–20.0)

## 2014-06-08 LAB — PROTIME-INR
INR: 0.95 (ref 0.00–1.49)
Prothrombin Time: 12.7 seconds (ref 11.6–15.2)

## 2014-06-08 LAB — ACETAMINOPHEN LEVEL: Acetaminophen (Tylenol), Serum: <15 ug/mL (ref 10–30)

## 2014-06-08 MED ORDER — LOPERAMIDE HCL 2 MG PO CAPS: 2.0000 mg | ORAL_CAPSULE | ORAL | Status: DC | PRN

## 2014-06-08 MED ORDER — LINAGLIPTIN 5 MG PO TABS
5.0000 mg | ORAL_TABLET | Freq: Every day | ORAL | Status: DC
  Administered 2014-06-08: 5 mg via ORAL
  Filled 2014-06-08: qty 1

## 2014-06-08 MED ORDER — CHLORDIAZEPOXIDE HCL 25 MG PO CAPS
25.0000 mg | ORAL_CAPSULE | ORAL | Status: DC
Start: 2014-06-11 — End: 2014-06-09

## 2014-06-08 MED ORDER — LORAZEPAM 1 MG PO TABS: 0.0000 mg | ORAL_TABLET | Freq: Two times a day (BID) | ORAL | Status: DC

## 2014-06-08 MED ORDER — CHLORDIAZEPOXIDE HCL 25 MG PO CAPS
50.0000 mg | ORAL_CAPSULE | Freq: Once | ORAL | Status: AC
  Administered 2014-06-08: 50 mg via ORAL
  Filled 2014-06-08: qty 2

## 2014-06-08 MED ORDER — ACETAMINOPHEN 325 MG PO TABS: 650.0000 mg | ORAL_TABLET | ORAL | Status: DC | PRN

## 2014-06-08 MED ORDER — IBUPROFEN 600 MG PO TABS
600.0000 mg | ORAL_TABLET | Freq: Three times a day (TID) | ORAL | Status: DC | PRN
  Filled 2014-06-08: qty 1

## 2014-06-08 MED ORDER — METFORMIN HCL 500 MG PO TABS
750.0000 mg | ORAL_TABLET | Freq: Every day | ORAL | Status: DC
  Administered 2014-06-08: 750 mg via ORAL
  Filled 2014-06-08: qty 2

## 2014-06-08 MED ORDER — CHLORDIAZEPOXIDE HCL 25 MG PO CAPS: 25.0000 mg | ORAL_CAPSULE | Freq: Every day | ORAL | Status: DC

## 2014-06-08 MED ORDER — HYDROXYZINE HCL 25 MG PO TABS: 25.0000 mg | ORAL_TABLET | Freq: Four times a day (QID) | ORAL | Status: DC | PRN

## 2014-06-08 MED ORDER — SODIUM CHLORIDE 0.9 % IV BOLUS (SEPSIS)
1000.0000 mL | Freq: Once | INTRAVENOUS | Status: AC
  Administered 2014-06-08: 1000 mL via INTRAVENOUS

## 2014-06-08 MED ORDER — NEBIVOLOL HCL 5 MG PO TABS
5.0000 mg | ORAL_TABLET | Freq: Every day | ORAL | Status: DC
  Administered 2014-06-08 – 2014-06-10 (×3): 5 mg via ORAL
  Filled 2014-06-08 (×3): qty 1

## 2014-06-08 MED ORDER — LORAZEPAM 1 MG PO TABS
0.0000 mg | ORAL_TABLET | Freq: Four times a day (QID) | ORAL | Status: DC
  Administered 2014-06-08: 2 mg via ORAL
  Administered 2014-06-08: 4 mg via ORAL
  Filled 2014-06-08: qty 4
  Filled 2014-06-08: qty 2

## 2014-06-08 MED ORDER — LORAZEPAM 2 MG/ML IJ SOLN
2.0000 mg | Freq: Once | INTRAMUSCULAR | Status: AC
  Administered 2014-06-08: 2 mg via INTRAVENOUS
  Filled 2014-06-08: qty 1

## 2014-06-08 MED ORDER — ONDANSETRON 4 MG PO TBDP
4.0000 mg | ORAL_TABLET | Freq: Once | ORAL | Status: AC
  Administered 2014-06-08: 4 mg via ORAL
  Filled 2014-06-08: qty 1

## 2014-06-08 MED ORDER — VITAMIN B-1 100 MG PO TABS
100.0000 mg | ORAL_TABLET | Freq: Every day | ORAL | Status: DC
  Administered 2014-06-08 – 2014-06-10 (×3): 100 mg via ORAL
  Filled 2014-06-08 (×3): qty 1

## 2014-06-08 MED ORDER — CHLORDIAZEPOXIDE HCL 25 MG PO CAPS: 25.0000 mg | ORAL_CAPSULE | Freq: Four times a day (QID) | ORAL | Status: DC | PRN

## 2014-06-08 MED ORDER — ONDANSETRON HCL 4 MG PO TABS: 4.0000 mg | ORAL_TABLET | Freq: Three times a day (TID) | ORAL | Status: DC | PRN

## 2014-06-08 MED ORDER — ADULT MULTIVITAMIN W/MINERALS CH
1.0000 | ORAL_TABLET | Freq: Every day | ORAL | Status: DC
  Administered 2014-06-09 – 2014-06-10 (×2): 1 via ORAL
  Filled 2014-06-08 (×2): qty 1

## 2014-06-08 MED ORDER — LISINOPRIL 5 MG PO TABS
5.0000 mg | ORAL_TABLET | Freq: Every day | ORAL | Status: DC
  Administered 2014-06-08 – 2014-06-10 (×3): 5 mg via ORAL
  Filled 2014-06-08: qty 1
  Filled 2014-06-08: qty 2
  Filled 2014-06-08: qty 1

## 2014-06-08 MED ORDER — EZETIMIBE 10 MG PO TABS
10.0000 mg | ORAL_TABLET | Freq: Every day | ORAL | Status: DC
  Administered 2014-06-08 – 2014-06-10 (×3): 10 mg via ORAL
  Filled 2014-06-08 (×3): qty 1

## 2014-06-08 MED ORDER — ONDANSETRON 4 MG PO TBDP
4.0000 mg | ORAL_TABLET | Freq: Four times a day (QID) | ORAL | Status: DC | PRN
  Administered 2014-06-08: 4 mg via ORAL
  Filled 2014-06-08 (×2): qty 1

## 2014-06-08 MED ORDER — ESCITALOPRAM OXALATE 20 MG PO TABS
20.0000 mg | ORAL_TABLET | Freq: Every day | ORAL | Status: DC
  Administered 2014-06-08 – 2014-06-10 (×3): 20 mg via ORAL
  Filled 2014-06-08: qty 1
  Filled 2014-06-08: qty 2
  Filled 2014-06-08: qty 1

## 2014-06-08 MED ORDER — CHLORDIAZEPOXIDE HCL 25 MG PO CAPS: 25.0000 mg | ORAL_CAPSULE | Freq: Four times a day (QID) | ORAL | Status: DC

## 2014-06-08 MED ORDER — ZOLPIDEM TARTRATE 5 MG PO TABS: 5.0000 mg | ORAL_TABLET | Freq: Every evening | ORAL | Status: DC | PRN

## 2014-06-08 MED ORDER — CHLORDIAZEPOXIDE HCL 25 MG PO CAPS: 25.0000 mg | ORAL_CAPSULE | Freq: Three times a day (TID) | ORAL | Status: DC

## 2014-06-08 MED ORDER — ONDANSETRON HCL 4 MG/2ML IJ SOLN
4.0000 mg | Freq: Once | INTRAMUSCULAR | Status: AC
  Administered 2014-06-08: 4 mg via INTRAVENOUS
  Filled 2014-06-08: qty 2

## 2014-06-08 MED ORDER — THIAMINE HCL 100 MG/ML IJ SOLN
100.0000 mg | Freq: Every day | INTRAMUSCULAR | Status: DC
  Filled 2014-06-08: qty 1

## 2014-06-08 MED ORDER — DIPHENOXYLATE-ATROPINE 2.5-0.025 MG PO TABS: 1.0000 | ORAL_TABLET | Freq: Four times a day (QID) | ORAL | Status: DC | PRN

## 2014-06-08 NOTE — ED Provider Notes (Addendum)
CSN: [id number]     Arrival date & time 06/08/14  1321 History   First MD Initiated Contact with Patient 06/08/14 1530     Chief Complaint  Patient presents with  . alcohol detox   . Depression    HPI Pt reports ETOH abuse and requests detox. Hx of same before. Drinking today. Brought by husband. Pt works as a Arts administrator. Mild nausea now without vomiting. No abd pain. No hallucinations. No seizures.    Past Medical History  Diagnosis Date  . Alcohol abuse   . Diabetes mellitus   . Hypertension   . Fibromyalgia   . Depression   . Carotid artery stenosis    Past Surgical History  Procedure Laterality Date  . Cesarean section    . Tonsillectomy    . Bunionectomy     Family History  Problem Relation Age of Onset  . Cancer Mother   . Heart disease Father   . Heart disease Sister    History  Substance Use Topics  . Smoking status: Former Smoker    Quit date: 03/11/2010  . Smokeless tobacco: Never Used  . Alcohol Use: Yes     Comment: 4-5 bottles of wine per day   OB History   Grav Para Term Preterm Abortions TAB SAB Ect Mult Living                 Review of Systems  All other systems reviewed and are negative.     Allergies  Other; Latex; Lipitor; Shellfish allergy; Codeine; Pentazocine lactate; and Sulfonamide derivatives  Home Medications   Prior to Admission medications   Medication Sig Start Date End Date Taking? Authorizing Provider  diphenoxylate-atropine (LOMOTIL) 2.5-0.025 MG per tablet Take 1 tablet by mouth 4 (four) times daily as needed for diarrhea or loose stools.   Yes Historical Provider, MD  escitalopram (LEXAPRO) 20 MG tablet Take 20 mg by mouth daily.   Yes Historical Provider, MD  ezetimibe (ZETIA) 10 MG tablet Take 1 tablet (10 mg total) by mouth daily. For cholesterol control 04/18/14  Yes Robbie Lis, MD  lisinopril (PRINIVIL,ZESTRIL) 5 MG tablet Take 1 tablet (5 mg total) by mouth daily. For hypertension 04/18/14  Yes Robbie Lis, MD  LORazepam (ATIVAN) 1 MG tablet Take 1 tablet (1 mg total) by mouth every 6 (six) hours as needed for anxiety. 04/18/14  Yes Robbie Lis, MD  metFORMIN (GLUCOPHAGE) 500 MG tablet Take 1.5 tablets (750 mg total) by mouth at bedtime. 04/18/14  Yes Robbie Lis, MD  nebivolol (BYSTOLIC) 5 MG tablet Take 1 tablet (5 mg total) by mouth daily. For hypertension 04/18/14  Yes Robbie Lis, MD  sitaGLIPtin (JANUVIA) 25 MG tablet Take 1 tablet (25 mg total) by mouth daily. 04/18/14  Yes Robbie Lis, MD   BP 142/58  Pulse 101  Temp(Src) 98.9 F (37.2 C) (Oral)  Resp 20  SpO2 96% Physical Exam  Nursing note and vitals reviewed. Constitutional: She is oriented to person, place, and time. She appears well-developed and well-nourished. No distress.  HENT:  Head: Normocephalic and atraumatic.  Eyes: EOM are normal.  Neck: Normal range of motion.  Cardiovascular: Regular rhythm and normal heart sounds.   tachycardia  Pulmonary/Chest: Effort normal and breath sounds normal.  Abdominal: Soft. She exhibits no distension. There is no tenderness.  Musculoskeletal: Normal range of motion.  Neurological: She is alert and oriented to person, place, and time.  Skin: Skin is  warm and dry.  Psychiatric: She has a normal mood and affect. Judgment normal.    ED Course  Procedures (including critical care time)  CRITICAL CARE Performed by: Hoy Morn Total critical care time: 32 Critical care time was exclusive of separately billable procedures and treating other patients. Critical care was necessary to treat or prevent imminent or life-threatening deterioration. Critical care was time spent personally by me on the following activities: development of treatment plan with patient and/or surrogate as well as nursing, discussions with consultants, evaluation of patient's response to treatment, examination of patient, obtaining history from patient or surrogate, ordering and performing treatments and  interventions, ordering and review of laboratory studies, ordering and review of radiographic studies, pulse oximetry and re-evaluation of patient's condition.   Labs Review Labs Reviewed  ETHANOL - Abnormal; Notable for the following:    Alcohol, Ethyl (B) 288 (*)    All other components within normal limits  CBC WITH DIFFERENTIAL - Abnormal; Notable for the following:    Neutrophils Relative % 41 (*)    Lymphocytes Relative 52 (*)    All other components within normal limits  COMPREHENSIVE METABOLIC PANEL - Abnormal; Notable for the following:    Sodium 135 (*)    Chloride 92 (*)    Glucose, Bld 198 (*)    All other components within normal limits  SALICYLATE LEVEL - Abnormal; Notable for the following:    Salicylate Lvl <9.8 (*)    All other components within normal limits  PROTIME-INR  ACETAMINOPHEN LEVEL  URINE RAPID DRUG SCREEN (HOSP PERFORMED)    Imaging Review No results found.   EKG Interpretation None      MDM   Final diagnoses:  Alcohol abuse  Alcohol withdrawal delirium    Voluntary. CIWA protocol. Placement for ETOH abuse. No hi or si    Hoy Morn, MD 06/08/14 1609  11:22 PM Patient is developing worsening withdrawal symptoms.  Her CIWA scores increasing.  The nurses given a total of 4 mg of IV Ativan.  She continues to be having hallucinations.  The patient will be admitted the hospital for alcohol withdrawal delirium.  Patient will need to be admitted to the step down    Hoy Morn, MD 06/08/14 2325

## 2014-06-08 NOTE — ED Notes (Signed)
MD at bedside. 
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
## 2014-06-08 NOTE — ED Notes (Signed)
CIWA score continues to remain the same Patient noted to be thrashing around in bed and states "I'm seeing things." Will medicate with additional dose of Ativan IV, see MAR Will made EDP and ED Charge nurse aware

## 2014-06-08 NOTE — ED Notes (Signed)
[name] with TTS at bedside. 

## 2014-06-08 NOTE — ED Notes (Addendum)
Pt awake, alert, presents with c/o "need detox", pt also c/o nausea and feeling depressed.  Pt reports going through a divorce recently and "fell off the wagon".  Pt denies SI/HI, denies illicit drugs.  Pt reports last drink "had a glass of wine on the way here".  Pt with odd affect.  Skin PWD.  Speaking full sentences.  No tremors or tongue fasiculations noted.  Pt denies pain.  MAEI.  NAD.

## 2014-06-08 NOTE — ED Notes (Signed)
Assumed care of patient  Patient up to bathroom without assistance--gait steady Patient appears in NAD

## 2014-06-08 NOTE — ED Notes (Signed)
Patient with c/o "feeling shaky" and N/V VS updated CIWA score completed Patient medicated, see MAR

## 2014-06-08 NOTE — ED Notes (Signed)
Dr. Venora Maples aware of patient status and increased CIWA score Patient moved to ED 21 for cardiac monitoring, PIV placement and IV medications ED Charge aware

## 2014-06-08 NOTE — ED Notes (Signed)
Patient informed of order for PIV Patient will not allow this nurse to attempt placement in [facility], forearms or hands PIV access was attempted with good flashback, but patient pulled right arm up to her chest which caused PIV site to dislodge Patient will not allow for this nurse to attempt again Will have another nurse attempt PIV site Will give medications when PIV site obtained

## 2014-06-08 NOTE — ED Notes (Signed)
Pt changed into blue scrubs.  Security called to wand

## 2014-06-08 NOTE — H&P (Signed)
[facility] History and Physical  [name] GBT:[id number] DOB: 09-25-1946 DOA: 06/08/2014  Referring physician:  Jola Schmidt PCP:  Pcp Not In System   Chief Complaint:  Alcohol withdrawal  HPI:  The patient is a 68 y.o. year-old female with history of alcohol abuse, hypertension, diabetes mellitus, depression, fibromyalgia who presents with alcohol withdrawal.  The patient was last at their baseline health earlier today.   she states the last 4 weeks, she fell off the wagon and started drinking a box of wine per day. She denies drinking any more than this or any less than this. She is withdrawn from alcohol previously with multiple blackouts, however she denies any episodes of seizures or needing to be intubated for alcohol withdrawal. She denies any other drug use. She states that she has had a lot of stresses in her life recently, and then decided today that she needed to quit drinking so she came to the hospital for detox. Her last drink was on the way to the hospital.  She is currently having some tremulousness, anxiety, restlessness, nausea, and stating that she is starting to hallucinate her dog.   In the emergency department, her labs were notable for glucose of 198, CBC within normal limits, Alcohol level of 288, negative Tylenol and aspirin levels and negative urine drug screen.  Her initial CIWA score was 12, then it rose to 29, and most recently was 58. She has received a total of 10 mg of Ativan and 50 mg of Librium over the last 7 hours.  Case was discussed with critical care who recommended giving 5 mg of Valium and continuing frequent CIWA scores overnight.  They are going to follow via [facility]  Review of Systems:  General:  Denies fevers, chills, weight loss or gain HEENT:  Denies changes to hearing and vision , rhinorrhea, sinus congestion, sore throat CV:  Denies chest pain and palpitations, lower extremity edema.  PULM:  Denies SOB, wheezing, cough.   GI:  + nausea  and diarrhea GU:  Denies dysuria, frequency, urgency ENDO:  Denies polyuria, polydipsia.   HEME:  Denies hematemesis, blood in stools, melena, abnormal bruising or bleeding.  LYMPH:  Denies lymphadenopathy.   MSK:  Denies arthralgias, myalgias.   DERM:  Denies skin rash or ulcer.   NEURO:  Denies focal numbness, weakness, slurred speech, confusion, facial droop.  PSYCH:  Increase anxiety and depression due to life stressors over last month.  Past Medical History  Diagnosis Date  . Alcohol abuse   . Diabetes mellitus   . Hypertension   . Fibromyalgia   . Depression   . Carotid artery stenosis    Past Surgical History  Procedure Laterality Date  . Cesarean section    . Tonsillectomy    . Bunionectomy     Social History:  reports that she quit smoking about 4 years ago. She has never used smokeless tobacco. She reports that she drinks alcohol. She reports that she does not use illicit drugs. Works as abstinence Therapist, sports and goes to [employer]  Allergies  Allergen Reactions  . Other Anaphylaxis    shellfish  . Latex Other (See Comments)    Break outs  . Lipitor [Atorvastatin Calcium] Other (See Comments)    Muscle aches  . Shellfish Allergy   . Codeine Rash    And nausea   . Pentazocine Lactate Rash  . Sulfonamide Derivatives Rash    Family History  Problem Relation Age of Onset  . Cancer  Mother   . Heart disease Father   . Heart disease Sister      Prior to Admission medications   Medication Sig Start Date End Date Taking? Authorizing Provider  diphenoxylate-atropine (LOMOTIL) 2.5-0.025 MG per tablet Take 1 tablet by mouth 4 (four) times daily as needed for diarrhea or loose stools.   Yes Historical Provider, MD  escitalopram (LEXAPRO) 20 MG tablet Take 20 mg by mouth daily.   Yes Historical Provider, MD  ezetimibe (ZETIA) 10 MG tablet Take 1 tablet (10 mg total) by mouth daily. For cholesterol control 04/18/14  Yes Robbie Lis, MD  lisinopril (PRINIVIL,ZESTRIL) 5 MG  tablet Take 1 tablet (5 mg total) by mouth daily. For hypertension 04/18/14  Yes Robbie Lis, MD  LORazepam (ATIVAN) 1 MG tablet Take 1 tablet (1 mg total) by mouth every 6 (six) hours as needed for anxiety. 04/18/14  Yes Robbie Lis, MD  metFORMIN (GLUCOPHAGE) 500 MG tablet Take 1.5 tablets (750 mg total) by mouth at bedtime. 04/18/14  Yes Robbie Lis, MD  nebivolol (BYSTOLIC) 5 MG tablet Take 1 tablet (5 mg total) by mouth daily. For hypertension 04/18/14  Yes Robbie Lis, MD  sitaGLIPtin (JANUVIA) 25 MG tablet Take 1 tablet (25 mg total) by mouth daily. 04/18/14  Yes Robbie Lis, MD   Physical Exam: Filed Vitals:   06/08/14 2330 06/08/14 2338 06/08/14 2338 06/09/14 0000  BP: 111/61 133/51 133/51 138/65  Pulse: 98 99 101 97  Temp:      TempSrc:      Resp:      SpO2: 97% 95%  90%     General:   [name], no acute distress, restless, distractible, tremulous  Eyes:  PERRL, anicteric, non-injected.  ENT:  Nares clear.  OP clear, non-erythematous without plaques or exudates.  MMM., positive tongue fasciculations   Neck:  Supple without TM or JVD.    Lymph:  No cervical, supraclavicular, or submandibular LAD.  Cardiovascular:  mild tachycardia, regular rhythm, normal S1, S2, without m/r/g.  2+ pulses, warm extremities  Respiratory:  CTA bilaterally without increased WOB.  Abdomen:  NABS.  Soft, ND/NT.    Skin:  No rashes or focal lesions.  Musculoskeletal:  Normal bulk and tone.  No LE edema.    Psychiatric:  A & O x 4.  anxious, mildly agitated affect.  Neurologic:  CN 3-12 intact.  5/5 strength.  Sensation intact.Tremulous   Labs on Admission:  Basic Metabolic Panel:  Recent Labs Lab 06/08/14 1524  NA 135*  K 4.0  CL 92*  CO2 19  GLUCOSE 198*  BUN 15  CREATININE 0.54  CALCIUM 8.6   Liver Function Tests:  Recent Labs Lab 06/08/14 1524  AST 27  ALT 17  ALKPHOS 96  BILITOT 0.7  PROT 7.9  ALBUMIN 4.0   No results found for this basename: LIPASE,  AMYLASE,  in the last 168 hours No results found for this basename: AMMONIA,  in the last 168 hours CBC:  Recent Labs Lab 06/08/14 1524  WBC 6.1  NEUTROABS 2.5  HGB 14.2  HCT 41.4  MCV 92.8  PLT 178   Cardiac Enzymes: No results found for this basename: CKTOTAL, CKMB, CKMBINDEX, TROPONINI,  in the last 168 hours  BNP (last 3 results) No results found for this basename: PROBNP,  in the last 8760 hours CBG: No results found for this basename: GLUCAP,  in the last 168 hours  Radiological Exams on Admission: No  results found.  EKG: pending  Assessment/Plan Active Problems:   DIABETES MELLITUS, TYPE II   ANXIETY   DEPRESSION   HYPERTENSION   Alcohol withdrawal with perceptual disturbances  ---  Alcohol withdrawal with elevated CIWA scores, given the sudden onset and high scores I am concerned that she may need Precedex drip and/or intubation with sedation for withdrawal -  Admit to stepdown -  Per discussion with Dr. Halford Chessman, will give 5mg  of valium now -  Every 2 hour Ativan via CIWA score -  Thiamine, folate, MVI -  Elink to follow -  Check electrolytes including magnesium in AM  Diabetes mellitus with hyperglycemia -  Hold metformin/linagliptin -  Start low dose SSI  HTN/HLD, blood pressure currently stable -  Continue nebivolol, lisinopril  Depression, stable.  Continue escitalopram  Hyponatremia, likely due to beer (wine) potomania -  IVF overnight and repeat in AM  Diet:  diabetic Access:  PIV IVF:  yes Proph:  lovenox  Code Status: full Family Communication: patient alone Disposition Plan: Admit to stepdown  Time spent: 60 min Janece Canterbury [facility] Pager [phone]  If 7PM-7AM, please contact night-coverage [email or web address] Password [facility] 06/09/2014, 12:20 AM

## 2014-06-09 DIAGNOSIS — F3289 Other specified depressive episodes: Secondary | ICD-10-CM

## 2014-06-09 DIAGNOSIS — E871 Hypo-osmolality and hyponatremia: Secondary | ICD-10-CM

## 2014-06-09 DIAGNOSIS — F1010 Alcohol abuse, uncomplicated: Secondary | ICD-10-CM

## 2014-06-09 DIAGNOSIS — F10232 Alcohol dependence with withdrawal with perceptual disturbance: Secondary | ICD-10-CM

## 2014-06-09 DIAGNOSIS — F329 Major depressive disorder, single episode, unspecified: Secondary | ICD-10-CM

## 2014-06-09 DIAGNOSIS — F10932 Alcohol use, unspecified with withdrawal with perceptual disturbance: Secondary | ICD-10-CM

## 2014-06-09 LAB — GLUCOSE, CAPILLARY
Glucose-Capillary: 126 mg/dL — ABNORMAL HIGH (ref 70–99)
Glucose-Capillary: 128 mg/dL — ABNORMAL HIGH (ref 70–99)
Glucose-Capillary: 174 mg/dL — ABNORMAL HIGH (ref 70–99)
Glucose-Capillary: 138 mg/dL — ABNORMAL HIGH (ref 70–99)
Glucose-Capillary: 158 mg/dL — ABNORMAL HIGH (ref 70–99)

## 2014-06-09 LAB — BASIC METABOLIC PANEL
BUN: 18 mg/dL (ref 6–23)
CO2: 26 mEq/L (ref 19–32)
Calcium: 8.1 mg/dL — ABNORMAL LOW (ref 8.4–10.5)
Chloride: 94 mEq/L — ABNORMAL LOW (ref 96–112)
Creatinine, Ser: 0.7 mg/dL (ref 0.50–1.10)
GFR calc Af Amer: >90 mL/min (ref >90)
GFR calc non Af Amer: 88 mL/min — ABNORMAL LOW (ref >90)
Glucose, Bld: 131 mg/dL — ABNORMAL HIGH (ref 70–99)
Potassium: 4.1 mEq/L (ref 3.7–5.3)
Sodium: 135 mEq/L — ABNORMAL LOW (ref 137–147)

## 2014-06-09 LAB — MRSA PCR SCREENING: MRSA by PCR: NEGATIVE

## 2014-06-09 MED ORDER — SODIUM CHLORIDE 0.9 % IJ SOLN
3.0000 mL | Freq: Two times a day (BID) | INTRAMUSCULAR | Status: DC
  Administered 2014-06-09: 3 mL via INTRAVENOUS

## 2014-06-09 MED ORDER — POLYETHYLENE GLYCOL 3350 17 G PO PACK
17.0000 g | PACK | Freq: Every day | ORAL | Status: DC | PRN
  Filled 2014-06-09: qty 1

## 2014-06-09 MED ORDER — INSULIN ASPART 100 UNIT/ML ~~LOC~~ SOLN: 0.0000 [IU] | Freq: Every day | SUBCUTANEOUS | Status: DC

## 2014-06-09 MED ORDER — FOLIC ACID 1 MG PO TABS
1.0000 mg | ORAL_TABLET | Freq: Every day | ORAL | Status: DC
  Administered 2014-06-10: 1 mg via ORAL
  Filled 2014-06-09: qty 1

## 2014-06-09 MED ORDER — ENOXAPARIN SODIUM 40 MG/0.4ML ~~LOC~~ SOLN
40.0000 mg | SUBCUTANEOUS | Status: DC
  Administered 2014-06-09 – 2014-06-10 (×2): 40 mg via SUBCUTANEOUS
  Filled 2014-06-09 (×2): qty 0.4

## 2014-06-09 MED ORDER — BISACODYL 5 MG PO TBEC: 5.0000 mg | DELAYED_RELEASE_TABLET | Freq: Every day | ORAL | Status: DC | PRN

## 2014-06-09 MED ORDER — ONDANSETRON HCL 4 MG/2ML IJ SOLN: 4.0000 mg | Freq: Four times a day (QID) | INTRAMUSCULAR | Status: DC | PRN

## 2014-06-09 MED ORDER — INSULIN ASPART 100 UNIT/ML ~~LOC~~ SOLN
0.0000 [IU] | Freq: Three times a day (TID) | SUBCUTANEOUS | Status: DC
Start: 2014-06-09 — End: 2014-06-10
  Administered 2014-06-09: 1 [IU] via SUBCUTANEOUS
  Administered 2014-06-09: 2 [IU] via SUBCUTANEOUS
  Administered 2014-06-09 – 2014-06-10 (×2): 1 [IU] via SUBCUTANEOUS

## 2014-06-09 MED ORDER — SODIUM CHLORIDE 0.9 % IV SOLN
INTRAVENOUS | Status: DC
  Administered 2014-06-09 – 2014-06-10 (×2): via INTRAVENOUS

## 2014-06-09 MED ORDER — ONDANSETRON HCL 4 MG PO TABS: 4.0000 mg | ORAL_TABLET | Freq: Four times a day (QID) | ORAL | Status: DC | PRN

## 2014-06-09 MED ORDER — LORAZEPAM 2 MG/ML IJ SOLN
0.0000 mg | INTRAMUSCULAR | Status: DC
  Administered 2014-06-10: 2 mg via INTRAVENOUS
  Filled 2014-06-09 (×2): qty 1

## 2014-06-09 MED ORDER — DIAZEPAM 5 MG PO TABS: 5.0000 mg | ORAL_TABLET | Freq: Once | ORAL | Status: DC

## 2014-06-09 NOTE — Progress Notes (Signed)
[facility] PROGRESS NOTE  [name] CBU:[id number] DOB: 03-Jul-1946 DOA: 06/08/2014 PCP: Pcp Not In System  Assessment/Plan: Alcohol withdrawal with elevated CIWA scores - Cont per CIWA protocol - Thiamine, folate, MVI  - Critical care monitoring should pt require precedex and/or intubation - Follow electrolytes and correct as needed Diabetes mellitus with hyperglycemia  - Hold metformin/linagliptin  - Start low dose SSI  HTN/HLD, blood pressure currently stable  - Continue nebivolol, lisinopril  Depression, stable. Continue escitalopram -Pt reports feeling severely depressed, denies SI or HI -Reports drinking to "forget" the anniversary of her son's death 22yrs ago  -Consider Psych consult Hyponatremia, likely due to beer (wine) potomania  - IVF as tolerated  Code Status: Full Family Communication: Pt in room Disposition Plan: Pending   Consultants:    Procedures:    Antibiotics:   (indicate start date, and stop date if known)  HPI/Subjective: No acute events noted overnight  Objective: Filed Vitals:   06/09/14 0400 06/09/14 0500 06/09/14 0600 06/09/14 0700  BP: 120/49 141/54 138/55 129/58  Pulse: 81 82 80 74  Temp: 98.5 F (36.9 C)     TempSrc: Oral     Resp: 22 20 20 19   Height:      Weight:      SpO2: 96% 97% 97% 99%    Intake/Output Summary (Last 24 hours) at 06/09/14 0754 Last data filed at 06/09/14 0600  Gross per 24 hour  Intake    290 ml  Output      0 ml  Net    290 ml   Filed Weights   06/09/14 0130  Weight: 155 lb 13.8 oz (70.7 kg)    Exam:   General:  Awake, in nad  Cardiovascular: regular, s1, s2  Respiratory: normal resp effort, no wheezing  Abdomen: soft,nondistended  Musculoskeletal: perfused, no clubbing   Data Reviewed: Basic Metabolic Panel:  Recent Labs Lab 06/08/14 1524 06/09/14 0415  NA 135* 135*  K 4.0 4.1  CL 92* 94*  CO2 19 26  GLUCOSE 198* 131*  BUN 15 18  CREATININE 0.54 0.70   CALCIUM 8.6 8.1*   Liver Function Tests:  Recent Labs Lab 06/08/14 1524  AST 27  ALT 17  ALKPHOS 96  BILITOT 0.7  PROT 7.9  ALBUMIN 4.0   No results found for this basename: LIPASE, AMYLASE,  in the last 168 hours No results found for this basename: AMMONIA,  in the last 168 hours CBC:  Recent Labs Lab 06/08/14 1524  WBC 6.1  NEUTROABS 2.5  HGB 14.2  HCT 41.4  MCV 92.8  PLT 178   Cardiac Enzymes: No results found for this basename: CKTOTAL, CKMB, CKMBINDEX, TROPONINI,  in the last 168 hours BNP (last 3 results) No results found for this basename: PROBNP,  in the last 8760 hours CBG:  Recent Labs Lab 06/09/14 0159  GLUCAP 126*    Recent Results (from the past 240 hour(s))  MRSA PCR SCREENING     Status: None   Collection Time    06/09/14  1:43 AM      Result Value Ref Range Status   MRSA by PCR NEGATIVE  NEGATIVE Final   Comment:            The GeneXpert MRSA Assay (FDA     approved for NASAL specimens     only), is one component of a     comprehensive MRSA colonization     surveillance program. It is not  intended to diagnose MRSA     infection nor to guide or     monitor treatment for     MRSA infections.     Studies: No results found.  Scheduled Meds: . diazepam  5 mg Oral Once  . enoxaparin (LOVENOX) injection  40 mg Subcutaneous Q24H  . escitalopram  20 mg Oral Daily  . ezetimibe  10 mg Oral Daily  . [START ON 3/66/2947] folic acid  1 mg Oral Daily  . insulin aspart  0-5 Units Subcutaneous QHS  . insulin aspart  0-9 Units Subcutaneous TID WC  . lisinopril  5 mg Oral Daily  . LORazepam  0-4 mg Intravenous Q2H  . multivitamin with minerals  1 tablet Oral Daily  . nebivolol  5 mg Oral Daily  . sodium chloride  3 mL Intravenous Q12H  . thiamine  100 mg Oral Daily   Or  . thiamine  100 mg Intravenous Daily   Continuous Infusions: . sodium chloride 75 mL/hr at 06/09/14 0208    Active Problems:   DIABETES MELLITUS, TYPE II    ANXIETY   DEPRESSION   HYPERTENSION   Alcohol withdrawal with perceptual disturbances   Hyponatremia  Time spent: 69min  CHIU, [facility] Pager [phone]. If 7PM-7AM, please contact night-coverage at [email or web address], password [facility] 06/09/2014, 7:54 AM  LOS: 1 day

## 2014-06-09 NOTE — ED Notes (Signed)
Patient resting in position of comfort with eyes closed RR WNL--even and unlabored with equal rise and fall of chest Patient in NAD Side rails up, call bell in reach  

## 2014-06-10 DIAGNOSIS — F10231 Alcohol dependence with withdrawal delirium: Principal | ICD-10-CM

## 2014-06-10 DIAGNOSIS — F10931 Alcohol use, unspecified with withdrawal delirium: Principal | ICD-10-CM

## 2014-06-10 LAB — BASIC METABOLIC PANEL
BUN: 15 mg/dL (ref 6–23)
CO2: 24 mEq/L (ref 19–32)
Calcium: 8.1 mg/dL — ABNORMAL LOW (ref 8.4–10.5)
Chloride: 101 mEq/L (ref 96–112)
Creatinine, Ser: 0.56 mg/dL (ref 0.50–1.10)
GFR calc Af Amer: >90 mL/min (ref >90)
GFR calc non Af Amer: >90 mL/min (ref >90)
Glucose, Bld: 129 mg/dL — ABNORMAL HIGH (ref 70–99)
Potassium: 4 mEq/L (ref 3.7–5.3)
Sodium: 137 mEq/L (ref 137–147)

## 2014-06-10 LAB — MAGNESIUM: Magnesium: 1.8 mg/dL (ref 1.5–2.5)

## 2014-06-10 LAB — GLUCOSE, CAPILLARY: Glucose-Capillary: 124 mg/dL — ABNORMAL HIGH (ref 70–99)

## 2014-06-10 MED ORDER — LORAZEPAM 1 MG PO TABS: 1.0000 mg | ORAL_TABLET | Freq: Three times a day (TID) | ORAL | Status: DC | PRN

## 2014-06-10 NOTE — Discharge Instructions (Signed)
Alcohol Problems  Most adults who drink alcohol drink in moderation (not a lot) are at low risk for developing problems related to their drinking. However, all drinkers, including low-risk drinkers, should know about the health risks connected with drinking alcohol.  RECOMMENDATIONS FOR LOW-RISK DRINKING   Drink in moderation. Moderate drinking is defined as follows:    Men - no more than 2 drinks per day.   Nonpregnant women - no more than 1 drink per day.   Over age 65 - no more than 1 drink per day.  A standard drink is 12 grams of pure alcohol, which is equal to a 12 ounce bottle of beer or wine cooler, a 5 ounce glass of wine, or 1.5 ounces of distilled spirits (such as whiskey, brandy, vodka, or rum).   ABSTAIN FROM (DO NOT DRINK) ALCOHOL:   When pregnant or considering pregnancy.   When taking a medication that interacts with alcohol.   If you are alcohol dependent.   A medical condition that prohibits drinking alcohol (such as ulcer, liver disease, or heart disease).  DISCUSS WITH YOUR CAREGIVER:   If you are at risk for coronary heart disease, discuss the potential benefits and risks of alcohol use: Light to moderate drinking is associated with lower rates of coronary heart disease in certain populations (for example, men over age 45 and postmenopausal women). Infrequent or nondrinkers are advised not to begin light to moderate drinking to reduce the risk of coronary heart disease so as to avoid creating an alcohol-related problem. Similar protective effects can likely be gained through proper diet and exercise.   Women and the elderly have smaller amounts of body water than men. As a result women and the elderly achieve a higher blood alcohol concentration after drinking the same amount of alcohol.   Exposing a fetus to alcohol can cause a broad range of birth defects referred to as Fetal Alcohol Syndrome (FAS) or Alcohol-Related Birth Defects (ARBD). Although FAS/ARBD is connected with excessive  alcohol consumption during pregnancy, studies also have reported neurobehavioral problems in infants born to mothers reporting drinking an average of 1 drink per day during pregnancy.   Heavier drinking (the consumption of more than 4 drinks per occasion by men and more than 3 drinks per occasion by women) impairs learning (cognitive) and psychomotor functions and increases the risk of alcohol-related problems, including accidents and injuries.  CAGE QUESTIONS:    Have you ever felt that you should Cut down on your drinking?   Have people Annoyed you by criticizing your drinking?   Have you ever felt bad or Guilty about your drinking?   Have you ever had a drink first thing in the morning to steady your nerves or get rid of a hangover (Eye opener)?  If you answered positively to any of these questions: You may be at risk for alcohol-related problems if alcohol consumption is:    Men: Greater than 14 drinks per week or more than 4 drinks per occasion.   Women: Greater than 7 drinks per week or more than 3 drinks per occasion.  Do you or your family have a medical history of alcohol-related problems, such as:   Blackouts.   Sexual dysfunction.   Depression.   Trauma.   Liver dysfunction.   Sleep disorders.   Hypertension.   Chronic abdominal pain.   Has your drinking ever caused you problems, such as problems with your family, problems with your work (or school) performance, or accidents/injuries?     Do you have a compulsion to drink or a preoccupation with drinking?   Do you have poor control or are you unable to stop drinking once you have started?   Do you have to drink to avoid withdrawal symptoms?   Do you have problems with withdrawal such as tremors, nausea, sweats, or mood disturbances?   Does it take more alcohol than in the past to get you high?   Do you feel a strong urge to drink?   Do you change your plans so that you can have a drink?   Do you ever drink in the morning to relieve  the shakes or a hangover?  If you have answered a number of the previous questions positively, it may be time for you to talk to your caregivers, family, and friends and see if they think you have a problem. Alcoholism is a chemical dependency that keeps getting worse and will eventually destroy your health and relationships. Many alcoholics end up dead, impoverished, or in prison. This is often the end result of all chemical dependency.   Do not be discouraged if you are not ready to take action immediately.   Decisions to change behavior often involve up and down desires to change and feeling like you cannot decide.   Try to think more seriously about your drinking behavior.   Think of the reasons to quit.  WHERE TO GO FOR ADDITIONAL INFORMATION    The National Institute on Alcohol Abuse and Alcoholism (NIAAA)  www.niaaa.nih.gov   National Council on Alcoholism and Drug Dependence (NCADD)  www.ncadd.org   American Society of Addiction Medicine (ASAM)  www.asam.org   Document Released: 11/29/2005 Document Revised: 02/21/2012 Document Reviewed: 07/17/2008  ExitCare Patient Information 2015 [employer]. This information is not intended to replace advice given to you by your health care provider. Make sure you discuss any questions you have with your health care provider.

## 2014-06-10 NOTE — Discharge Summary (Addendum)
Physician Discharge Summary  [name] BHA:[id number] DOB: July 08, 1946 DOA: 06/08/2014  PCP: Pcp Not In System  Admit date: 06/08/2014 Discharge date: 06/10/2014  Time spent: 35 minutes  Recommendations for Outpatient Follow-up:  1. Follow up with PCP in 1-2 weeks  Discharge Diagnoses:  Active Problems:   DIABETES MELLITUS, TYPE II   ANXIETY   DEPRESSION   HYPERTENSION   Alcohol withdrawal with perceptual disturbances   Hyponatremia  Discharge Condition: Improved  Diet recommendation: Diabetic  Filed Weights   06/09/14 0130 06/09/14 2213  Weight: 155 lb 13.8 oz (70.7 kg) 158 lb 8 oz (71.895 kg)    History of present illness:  The patient is a 68 y.o. year-old female with history of alcohol abuse, hypertension, diabetes mellitus, depression, fibromyalgia who presents with alcohol withdrawal. The patient was last at their baseline health earlier today. she states the last 4 weeks, she fell off the wagon and started drinking a box of wine per day. She denies drinking any more than this or any less than this. She is withdrawn from alcohol previously with multiple blackouts, however she denies any episodes of seizures or needing to be intubated for alcohol withdrawal. She denies any other drug use. She states that she has had a lot of stresses in her life recently, and then decided today that she needed to quit drinking so she came to the hospital for detox. Her last drink was on the way to the hospital. She is currently having some tremulousness, anxiety, restlessness, nausea, and stating that she is starting to hallucinate her dog.  In the emergency department, her labs were notable for glucose of 198, CBC within normal limits, Alcohol level of 288, negative Tylenol and aspirin levels and negative urine drug screen. Her initial CIWA score was 12, then it rose to 29, and most recently was 65. She has received a total of 10 mg of Ativan and 50 mg of Librium over the last 7 hours. Case was  discussed with critical care who recommended giving 5 mg of Valium and continuing frequent CIWA scores overnight. They are going to follow via [place] [facility] Course:  Alcohol withdrawal with elevated CIWA scores  - Cont per CIWA protocol and improved - Cont onThiamine, folate, MVI    Diabetes mellitus with hyperglycemia  - Held metformin/linagliptin while inpatient, resume when discharged - Started low dose SSI  HTN/HLD, blood pressure remained stable during this admission - Continued nebivolol, lisinopril  Depression, stable. Continue escitalopram -Pt reports feeling severely depressed, denies SI or HI -Reported drinking to "forget" the anniversary of her son's death 66yrs ago  -Consider Psych follow up as outpatient Hyponatremia, likely due to beer (wine) potomania  - Improved with IVF as tolerated  Discharge Exam: Filed Vitals:   06/09/14 2100 06/09/14 2211 06/09/14 2213 06/10/14 0556  BP: 143/74 158/70  154/71  Pulse: 78 83  65  Temp:  98.3 F (36.8 C)  97.7 F (36.5 C)  TempSrc:  Oral  Axillary  Resp: 18 18  18   Height:   5\' 4"  (1.626 m)   Weight:   158 lb 8 oz (71.895 kg)   SpO2: 92% 97%  95%    General: Awake, in nad Cardiovascular: regular, s1, s2 Respiratory: normal resp effort, no wheezing  Discharge Instructions     Medication List         diphenoxylate-atropine 2.5-0.025 MG per tablet  Commonly known as:  LOMOTIL  Take 1 tablet by mouth 4 (four) times daily  as needed for diarrhea or loose stools.     escitalopram 20 MG tablet  Commonly known as:  LEXAPRO  Take 20 mg by mouth daily.     ezetimibe 10 MG tablet  Commonly known as:  ZETIA  Take 1 tablet (10 mg total) by mouth daily. For cholesterol control     lisinopril 5 MG tablet  Commonly known as:  PRINIVIL,ZESTRIL  Take 1 tablet (5 mg total) by mouth daily. For hypertension     LORazepam 1 MG tablet  Commonly known as:  ATIVAN  Take 1 tablet (1 mg total) by mouth every 8 (eight) hours as  needed for anxiety.     metFORMIN 500 MG tablet  Commonly known as:  GLUCOPHAGE  Take 1.5 tablets (750 mg total) by mouth at bedtime.     nebivolol 5 MG tablet  Commonly known as:  BYSTOLIC  Take 1 tablet (5 mg total) by mouth daily. For hypertension     sitaGLIPtin 25 MG tablet  Commonly known as:  JANUVIA  Take 1 tablet (25 mg total) by mouth daily.       Allergies  Allergen Reactions  . Other Anaphylaxis    shellfish  . Latex Other (See Comments)    Break outs  . Lipitor [Atorvastatin Calcium] Other (See Comments)    Muscle aches  . Shellfish Allergy   . Codeine Rash    And nausea   . Pentazocine Lactate Rash  . Sulfonamide Derivatives Rash   Follow-up Information   Schedule an appointment as soon as possible for a visit with follow up with PCP in 1-2 weeks.       The results of significant diagnostics from this hospitalization (including imaging, microbiology, ancillary and laboratory) are listed below for reference.    Significant Diagnostic Studies: No results found.  Microbiology: Recent Results (from the past 240 hour(s))  MRSA PCR SCREENING     Status: None   Collection Time    06/09/14  1:43 AM      Result Value Ref Range Status   MRSA by PCR NEGATIVE  NEGATIVE Final   Comment:            The GeneXpert MRSA Assay (FDA     approved for NASAL specimens     only), is one component of a     comprehensive MRSA colonization     surveillance program. It is not     intended to diagnose MRSA     infection nor to guide or     monitor treatment for     MRSA infections.     Labs: Basic Metabolic Panel:  Recent Labs Lab 06/08/14 1524 06/09/14 0415 06/10/14 0510  NA 135* 135* 137  K 4.0 4.1 4.0  CL 92* 94* 101  CO2 19 26 24   GLUCOSE 198* 131* 129*  BUN 15 18 15   CREATININE 0.54 0.70 0.56  CALCIUM 8.6 8.1* 8.1*  MG  --   --  1.8   Liver Function Tests:  Recent Labs Lab 06/08/14 1524  AST 27  ALT 17  ALKPHOS 96  BILITOT 0.7  PROT 7.9   ALBUMIN 4.0   No results found for this basename: LIPASE, AMYLASE,  in the last 168 hours No results found for this basename: AMMONIA,  in the last 168 hours CBC:  Recent Labs Lab 06/08/14 1524  WBC 6.1  NEUTROABS 2.5  HGB 14.2  HCT 41.4  MCV 92.8  PLT 178   Cardiac Enzymes:  No results found for this basename: CKTOTAL, CKMB, CKMBINDEX, TROPONINI,  in the last 168 hours BNP: BNP (last 3 results) No results found for this basename: PROBNP,  in the last 8760 hours CBG:  Recent Labs Lab 06/09/14 0810 06/09/14 1218 06/09/14 1719 06/09/14 2222 06/10/14 0735  GLUCAP 128* 174* 138* 158* 124*   Signed:  CHIU, STEPHEN K  [facility] 06/10/2014, 11:31 AM

## 2014-06-13 ENCOUNTER — Other Ambulatory Visit: Payer: Self-pay | Admitting: Internal Medicine

## 2014-06-23 ENCOUNTER — Emergency Department (HOSPITAL_COMMUNITY)
Admission: EM | Admit: 2014-06-23 | Discharge: 2014-06-23 | Disposition: A | Discharge status: Other Acute Inpatient Hospital | Payer: 59 | Attending: Emergency Medicine | Admitting: Emergency Medicine

## 2014-06-23 ENCOUNTER — Encounter (HOSPITAL_COMMUNITY): Payer: Self-pay | Admitting: Emergency Medicine

## 2014-06-23 ENCOUNTER — Observation Stay (HOSPITAL_COMMUNITY)
Admission: AD | Admit: 2014-06-23 | Discharge: 2014-06-24 | Disposition: A | Discharge status: Home / Self Care | Payer: 59 | Source: Intra-hospital | Attending: Psychiatry | Admitting: Psychiatry

## 2014-06-23 ENCOUNTER — Encounter (HOSPITAL_COMMUNITY): Payer: Self-pay | Admitting: *Deleted

## 2014-06-23 DIAGNOSIS — I10 Essential (primary) hypertension: Secondary | ICD-10-CM | POA: Insufficient documentation

## 2014-06-23 DIAGNOSIS — Z8739 Personal history of other diseases of the musculoskeletal system and connective tissue: Secondary | ICD-10-CM | POA: Insufficient documentation

## 2014-06-23 DIAGNOSIS — Z91040 Latex allergy status: Secondary | ICD-10-CM | POA: Insufficient documentation

## 2014-06-23 DIAGNOSIS — Z87891 Personal history of nicotine dependence: Secondary | ICD-10-CM | POA: Insufficient documentation

## 2014-06-23 DIAGNOSIS — F10988 Alcohol use, unspecified with other alcohol-induced disorder: Secondary | ICD-10-CM | POA: Insufficient documentation

## 2014-06-23 DIAGNOSIS — IMO0001 Reserved for inherently not codable concepts without codable children: Secondary | ICD-10-CM | POA: Insufficient documentation

## 2014-06-23 DIAGNOSIS — F1020 Alcohol dependence, uncomplicated: Principal | ICD-10-CM | POA: Diagnosis present

## 2014-06-23 DIAGNOSIS — Z8679 Personal history of other diseases of the circulatory system: Secondary | ICD-10-CM | POA: Insufficient documentation

## 2014-06-23 DIAGNOSIS — F3289 Other specified depressive episodes: Secondary | ICD-10-CM | POA: Insufficient documentation

## 2014-06-23 DIAGNOSIS — F329 Major depressive disorder, single episode, unspecified: Secondary | ICD-10-CM | POA: Insufficient documentation

## 2014-06-23 DIAGNOSIS — E119 Type 2 diabetes mellitus without complications: Secondary | ICD-10-CM | POA: Insufficient documentation

## 2014-06-23 DIAGNOSIS — F1010 Alcohol abuse, uncomplicated: Secondary | ICD-10-CM

## 2014-06-23 DIAGNOSIS — R110 Nausea: Secondary | ICD-10-CM | POA: Insufficient documentation

## 2014-06-23 DIAGNOSIS — F332 Major depressive disorder, recurrent severe without psychotic features: Secondary | ICD-10-CM | POA: Insufficient documentation

## 2014-06-23 DIAGNOSIS — Z79899 Other long term (current) drug therapy: Secondary | ICD-10-CM | POA: Insufficient documentation

## 2014-06-23 LAB — COMPREHENSIVE METABOLIC PANEL
ALT: 30 U/L (ref 0–35)
AST: 52 U/L — ABNORMAL HIGH (ref 0–37)
Albumin: 4.2 g/dL (ref 3.5–5.2)
Alkaline Phosphatase: 100 U/L (ref 39–117)
Anion gap: 25 — ABNORMAL HIGH (ref 5–15)
BUN: 8 mg/dL (ref 6–23)
CO2: 20 mEq/L (ref 19–32)
Calcium: 8.7 mg/dL (ref 8.4–10.5)
Chloride: 93 mEq/L — ABNORMAL LOW (ref 96–112)
Creatinine, Ser: 0.65 mg/dL (ref 0.50–1.10)
GFR calc Af Amer: >90 mL/min (ref >90)
GFR calc non Af Amer: >90 mL/min (ref >90)
Glucose, Bld: 228 mg/dL — ABNORMAL HIGH (ref 70–99)
Potassium: 4 mEq/L (ref 3.7–5.3)
Sodium: 138 mEq/L (ref 137–147)
Total Bilirubin: 0.3 mg/dL (ref 0.3–1.2)
Total Protein: 8.1 g/dL (ref 6.0–8.3)

## 2014-06-23 LAB — CBC
HCT: 41.2 % (ref 36.0–46.0)
Hemoglobin: 13.6 g/dL (ref 12.0–15.0)
MCH: 32.3 pg (ref 26.0–34.0)
MCHC: 33 g/dL (ref 30.0–36.0)
MCV: 97.9 fL (ref 78.0–100.0)
Platelets: 350 10*3/uL (ref 150–400)
RBC: 4.21 MIL/uL (ref 3.87–5.11)
RDW: 15.5 % (ref 11.5–15.5)
WBC: 6.2 10*3/uL (ref 4.0–10.5)

## 2014-06-23 LAB — GLUCOSE, CAPILLARY: Glucose-Capillary: 126 mg/dL — ABNORMAL HIGH (ref 70–99)

## 2014-06-23 LAB — CBG MONITORING, ED: Glucose-Capillary: 163 mg/dL — ABNORMAL HIGH (ref 70–99)

## 2014-06-23 LAB — SALICYLATE LEVEL: Salicylate Lvl: <2 mg/dL — ABNORMAL LOW (ref 2.8–20.0)

## 2014-06-23 LAB — ACETAMINOPHEN LEVEL: Acetaminophen (Tylenol), Serum: <15 ug/mL (ref 10–30)

## 2014-06-23 LAB — ETHANOL: Alcohol, Ethyl (B): 187 mg/dL — ABNORMAL HIGH (ref 0–11)

## 2014-06-23 MED ORDER — CHLORDIAZEPOXIDE HCL 25 MG PO CAPS: 25.0000 mg | ORAL_CAPSULE | Freq: Four times a day (QID) | ORAL | Status: DC | PRN

## 2014-06-23 MED ORDER — NICOTINE 21 MG/24HR TD PT24
21.0000 mg | MEDICATED_PATCH | Freq: Every day | TRANSDERMAL | Status: DC
  Filled 2014-06-23 (×3): qty 1

## 2014-06-23 MED ORDER — VITAMIN B-1 100 MG PO TABS: 100.0000 mg | ORAL_TABLET | Freq: Once | ORAL | Status: DC

## 2014-06-23 MED ORDER — THIAMINE HCL 100 MG/ML IJ SOLN: 100.0000 mg | Freq: Once | INTRAMUSCULAR | Status: DC

## 2014-06-23 MED ORDER — HYDROXYZINE HCL 25 MG PO TABS: 25.0000 mg | ORAL_TABLET | Freq: Four times a day (QID) | ORAL | Status: DC | PRN

## 2014-06-23 MED ORDER — LISINOPRIL 10 MG PO TABS
5.0000 mg | ORAL_TABLET | Freq: Once | ORAL | Status: AC
  Administered 2014-06-23: 5 mg via ORAL
  Filled 2014-06-23: qty 1

## 2014-06-23 MED ORDER — THIAMINE HCL 100 MG/ML IJ SOLN: 100.0000 mg | Freq: Every day | INTRAMUSCULAR | Status: DC

## 2014-06-23 MED ORDER — LORAZEPAM 1 MG PO TABS
0.0000 mg | ORAL_TABLET | Freq: Two times a day (BID) | ORAL | Status: DC
  Administered 2014-06-23: 1 mg via ORAL
  Filled 2014-06-23: qty 2

## 2014-06-23 MED ORDER — LORAZEPAM 1 MG PO TABS
0.0000 mg | ORAL_TABLET | Freq: Four times a day (QID) | ORAL | Status: DC
  Administered 2014-06-23: 1 mg via ORAL
  Filled 2014-06-23: qty 2

## 2014-06-23 MED ORDER — ALUM & MAG HYDROXIDE-SIMETH 200-200-20 MG/5ML PO SUSP: 30.0000 mL | ORAL | Status: DC | PRN

## 2014-06-23 MED ORDER — CHLORDIAZEPOXIDE HCL 25 MG PO CAPS: 25.0000 mg | ORAL_CAPSULE | Freq: Three times a day (TID) | ORAL | Status: DC

## 2014-06-23 MED ORDER — ONDANSETRON 4 MG PO TBDP
4.0000 mg | ORAL_TABLET | Freq: Four times a day (QID) | ORAL | Status: DC | PRN
  Administered 2014-06-23: 4 mg via ORAL
  Filled 2014-06-23: qty 1

## 2014-06-23 MED ORDER — CHLORDIAZEPOXIDE HCL 25 MG PO CAPS: 25.0000 mg | ORAL_CAPSULE | Freq: Every day | ORAL | Status: DC

## 2014-06-23 MED ORDER — VITAMIN B-1 100 MG PO TABS
100.0000 mg | ORAL_TABLET | Freq: Every day | ORAL | Status: DC
  Administered 2014-06-24: 100 mg via ORAL
  Filled 2014-06-23 (×3): qty 1

## 2014-06-23 MED ORDER — ADULT MULTIVITAMIN W/MINERALS CH
1.0000 | ORAL_TABLET | Freq: Every day | ORAL | Status: DC
  Administered 2014-06-23 – 2014-06-24 (×2): 1 via ORAL
  Filled 2014-06-23 (×5): qty 1

## 2014-06-23 MED ORDER — ACETAMINOPHEN 325 MG PO TABS
650.0000 mg | ORAL_TABLET | Freq: Four times a day (QID) | ORAL | Status: DC | PRN
Start: 2014-06-23 — End: 2014-06-24

## 2014-06-23 MED ORDER — LINAGLIPTIN 5 MG PO TABS
5.0000 mg | ORAL_TABLET | Freq: Every day | ORAL | Status: DC
  Administered 2014-06-24: 5 mg via ORAL
  Filled 2014-06-23 (×2): qty 1

## 2014-06-23 MED ORDER — LORAZEPAM 2 MG/ML IJ SOLN: 0.0000 mg | Freq: Four times a day (QID) | INTRAMUSCULAR | Status: DC

## 2014-06-23 MED ORDER — CHLORDIAZEPOXIDE HCL 25 MG PO CAPS
25.0000 mg | ORAL_CAPSULE | Freq: Four times a day (QID) | ORAL | Status: DC
  Administered 2014-06-23 – 2014-06-24 (×4): 25 mg via ORAL
  Filled 2014-06-23 (×4): qty 1

## 2014-06-23 MED ORDER — LOPERAMIDE HCL 2 MG PO CAPS: 2.0000 mg | ORAL_CAPSULE | ORAL | Status: DC | PRN

## 2014-06-23 MED ORDER — NEBIVOLOL HCL 5 MG PO TABS
5.0000 mg | ORAL_TABLET | Freq: Every day | ORAL | Status: DC
  Administered 2014-06-23: 5 mg via ORAL
  Filled 2014-06-23: qty 1

## 2014-06-23 MED ORDER — LORAZEPAM 2 MG/ML IJ SOLN: 0.0000 mg | Freq: Two times a day (BID) | INTRAMUSCULAR | Status: DC

## 2014-06-23 MED ORDER — NEBIVOLOL HCL 5 MG PO TABS
5.0000 mg | ORAL_TABLET | Freq: Every day | ORAL | Status: DC
  Administered 2014-06-24: 5 mg via ORAL
  Filled 2014-06-23 (×2): qty 1

## 2014-06-23 MED ORDER — LINAGLIPTIN 5 MG PO TABS
5.0000 mg | ORAL_TABLET | Freq: Every day | ORAL | Status: DC
Start: 2014-06-23 — End: 2014-06-23
  Administered 2014-06-23: 5 mg via ORAL
  Filled 2014-06-23: qty 1

## 2014-06-23 MED ORDER — CHLORDIAZEPOXIDE HCL 25 MG PO CAPS: 25.0000 mg | ORAL_CAPSULE | ORAL | Status: DC

## 2014-06-23 MED ORDER — VITAMIN B-1 100 MG PO TABS
100.0000 mg | ORAL_TABLET | Freq: Every day | ORAL | Status: DC
  Administered 2014-06-23: 100 mg via ORAL
  Filled 2014-06-23: qty 1

## 2014-06-23 MED ORDER — MAGNESIUM HYDROXIDE 400 MG/5ML PO SUSP: 30.0000 mL | Freq: Every day | ORAL | Status: DC | PRN

## 2014-06-23 NOTE — ED Notes (Signed)
Pt has been accepted at [facility] Obs unit, able to transfer after 3pm.

## 2014-06-23 NOTE — BH Assessment (Signed)
[place] Assessment Progress Note  CSW received a call from Santiago Glad at [facility] requesting a TTS assessment at this time.  Santiago Glad states that pt has been in the ED since 3 AM but an assessment was never called in on her until now.  Santiago Glad states that pt is there for detox and is wanting further inpatient treatment.    Regan Lemming, LCSW 06/23/2014  1:02 PM

## 2014-06-23 NOTE — ED Notes (Signed)
Called report to Lynd, Therapist, sports at [facility]. Patient to receive BP and diabetic meds prior to transfer.

## 2014-06-23 NOTE — ED Notes (Signed)
Nurse in [place] [facility] unable to take pt due to other circumstances, report has been given.

## 2014-06-23 NOTE — ED Notes (Addendum)
[facility]-- no female detox beds at present-- may have a d/c later-- try back in a few hours per staff [name] -- (pt was there 2 yrs ago) has a bed for 28 day program [facility]- has 1 female bed for detox-- will send over prescreen/labs/notes. Cannot accept due to pt insurance [facility]-- will send info

## 2014-06-23 NOTE — Progress Notes (Signed)
Nursing admission note:  Patient brought into [facility] by husband.  Patient is requesting detox from alcohol.  Patient is currently in observation unit.  Patient has been attempting to detox on her own.  She states that she is unable to detox on her own.  Her BAL is 189. Patient denies any SI/HI/AVH.  Patient has numerous stressors.  She is currently going through a divorce after 54 years of marriage.  The anniversary of her son's suicide is coming up.  Patient son committed suicide 10 years ago.  She has a daughter that is supportive and she is currently living in [place].  Patient states that she goes to [place] occasionally.  She has had prior admissions here, the last one being in January.  Patient has also had some DUIs and has an outstanding charge of driving without a license.  She reports no seizures due to her drinking and reports no other drug use.  Patient is a non-smoker.  Patient lives alone, however, her husband has been staying with her while she attempted to detox.  Past med hx includes NIDD, HTN, PVD, fibromyalgia and colitis.  Her withdrawals symptoms include tremors, nausea and overall malaise.  She reports severe depression and anxiety.  Patient was oriented to unit and given nutrition.

## 2014-06-23 NOTE — ED Notes (Signed)
The pt is here for ??? Detox from alcohol.  She last had alcohol 4 houra ago and she has been drinking nyquil.  She is very unsteady on her feet.  The female that brought her in with her suitcase left  From the waiting room.

## 2014-06-23 NOTE — ED Notes (Signed)
Security to come wand the pt.

## 2014-06-23 NOTE — BH Assessment (Signed)
Tele Assessment Note   [name] is an 68 y.o. female who presented to [facility] for alcohol detox. Patient reports no SI/HI/AVH. Patient states that she could not stop drinking for the past 5-6 days to the point of experiencing feeling physical withdrawal symptoms. Patient identifies current stressors as: going through divorce after 1 years of marriage and upcoming 10 year anniversary son's suicide. Patient reports frequent relapses over the past two years with several admissions for detox and treatment at [facility], [facility], and other treatment centers. Patient unable to maintain sobriety for substantial length of time. She reports drinking 2-4pks of mini wine bottles daily for the past 5-6 days. She reports ongoing alcohol use for the last two years with frequent binging with no other substance use identified. Patient states that she has difficulty finding transportation to [employer] and to current outpatient provider, Dr. Toy Care for medication management. Patient reports no drivers license due to past DUI and pending charge of driving without a license. She reports limited support due to divorce and daughter living out of state. She worries that her alcohol use is affecting physical health in a negative way and is seeking detox.   At this time, the patient states that she wants to detox, return home, and continue follow-up with Dr. Toy Care for medication management/attend [place] meetings. CSW ran patient by Charmaine Downs, NP and Debarah Crape, [facility] and deemed patient appropriate for OBS unit. CSW contacted Santiago Glad, RN to inform her of disposition.   Axis I: Alcohol Use Disorder-Severe; Major Depressive Disorder-Recurrent, severe  Axis II: Deferred Axis III:  Past Medical History  Diagnosis Date  . Alcohol abuse   . Diabetes mellitus   . Hypertension   . Fibromyalgia   . Depression   . Carotid artery stenosis    Axis IV: problems related to legal system/crime, problems related to social  environment, problems with access to health care services and problems with primary support group Axis V: 41-50 serious symptoms  Past Medical History:  Past Medical History  Diagnosis Date  . Alcohol abuse   . Diabetes mellitus   . Hypertension   . Fibromyalgia   . Depression   . Carotid artery stenosis     Past Surgical History  Procedure Laterality Date  . Cesarean section    . Tonsillectomy    . Bunionectomy      Family History:  Family History  Problem Relation Age of Onset  . Cancer Mother   . Heart disease Father   . Heart disease Sister     Social History:  reports that she quit smoking about 4 years ago. She has never used smokeless tobacco. She reports that she drinks alcohol. She reports that she does not use illicit drugs.  Additional Social History:  Alcohol / Drug Use Pain Medications: see MAR Prescriptions: see MAR Over the Counter: see MAR History of alcohol / drug use?: Yes Longest period of sobriety (when/how long): unable to maintain sobriety for substantial amount of time  Negative Consequences of Use: Personal relationships;Legal Substance #1 Name of Substance 1: Alcohol 1 - Age of First Use: 20 1 - Amount (size/oz): 2-4 pks of miniature wine bottles daily  1 - Frequency: daily 1 - Duration: 5-6 days; ongoing; binges 1 - Last Use / Amount: yesterday, 06/22/14  CIWA: CIWA-Ar BP: 160/83 mmHg Pulse Rate: 89 Nausea and Vomiting: mild nausea with no vomiting Tactile Disturbances: none Tremor: two Auditory Disturbances: not present Paroxysmal Sweats: two Visual Disturbances: very  mild sensitivity (only seeing spots when lights are on) Anxiety: mildly anxious Headache, Fullness in Head: none present Agitation: normal activity Orientation and Clouding of Sensorium: oriented and can do serial additions CIWA-Ar Total: 7 COWS:    Allergies:  Allergies  Allergen Reactions  . Other Anaphylaxis    shellfish  . Shellfish Allergy Anaphylaxis  .  Latex Other (See Comments)    Break outs  . Lipitor [Atorvastatin Calcium] Other (See Comments)    Muscle aches  . Codeine Rash    And nausea   . Pentazocine Lactate Rash  . Sulfonamide Derivatives Rash    Home Medications:  (Not in a hospital admission)  OB/GYN Status:  No LMP recorded. Patient is postmenopausal.  General Assessment Data Location of Assessment: [facility] ED ACT Assessment: Yes Is this a Tele or Face-to-Face Assessment?: Tele Assessment Is this an Initial Assessment or a Re-assessment for this encounter?: Initial Assessment Living Arrangements: Alone Can pt return to current living arrangement?: Yes Admission Status: Voluntary Is patient capable of signing voluntary admission?: Yes Transfer from: Home Referral Source: Self/Family/Friend     [place] Living Arrangements: Alone Name of Psychiatrist: Dr. Toy Care Name of Therapist: None now.  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, sports) Name of school:  (n/a) Contact person:  (n/a)  Risk to self Suicidal Ideation: No Suicidal Intent: No Is patient at risk for suicide?: No Suicidal Plan?: No Access to Means: No What has been your use of drugs/alcohol within the last 12 months?:  (alcohol-ongoing use) Previous Attempts/Gestures: No How many times?:  (n/a) Other Self Harm Risks:  (chronic alcohol abuse affecting health) Triggers for Past Attempts:  (n/a) Intentional Self Injurious Behavior: None Family Suicide History: Yes Recent stressful life event(s): Divorce;Trauma (Comment) (anniversary of son's suicide) Persecutory voices/beliefs?: No Depression: Yes Depression Symptoms: Feeling worthless/self pity;Loss of interest in usual pleasures;Despondent Substance abuse history and/or treatment for substance abuse?: Yes Suicide prevention information given to non-admitted patients: Not  applicable  Risk to Others Homicidal Ideation: No Thoughts of Harm to Others: No Current Homicidal Intent: No Current Homicidal Plan: No Access to Homicidal Means: No Identified Victim:  (n/a ) History of harm to others?: No Assessment of Violence: None Noted Violent Behavior Description:  (n/a ) Does patient have access to weapons?: No Criminal Charges Pending?: Yes Describe Pending Criminal Charges:  (Driving with revoked license) Does patient have a court date: Yes Court Date:  (unknown )  Psychosis Hallucinations: None noted Delusions: None noted  Mental Status Report Appear/Hygiene: Disheveled;In scrubs Eye Contact: Good Motor Activity: Unremarkable Speech: Logical/coherent Level of Consciousness: Quiet/awake Mood: Depressed Affect: Sad;Sullen Anxiety Level: Minimal Thought Processes: Coherent Judgement: Unimpaired Orientation: Person;Place;Time;Situation Obsessive Compulsive Thoughts/Behaviors: None  Cognitive Functioning Concentration: Normal Memory: Remote Intact IQ: Above Average Insight: Good Impulse Control: Good Appetite: Fair Weight Loss:  (n/a) Weight Gain:  (n/a) Sleep: No Change Total Hours of Sleep:  (n/a) Vegetative Symptoms: None  ADLScreening [facility] Assessment Services) Patient's cognitive ability adequate to safely complete daily activities?: Yes Patient able to express need for assistance with ADLs?: Yes Independently performs ADLs?: Yes (appropriate for developmental age)  Prior Inpatient Therapy Prior Inpatient Therapy: Yes Prior Therapy Dates: 6 admissions in two years.  Last 04/15 Prior Therapy Facilty/Provider(s):  [facility], [facility], "numerous others.") Reason for Treatment: Detox  Prior Outpatient Therapy Prior Outpatient Therapy: Yes Prior Therapy Dates: current Prior Therapy Facilty/Provider(s): Dr. Toy Care  Reason for Treatment: medication management  ADL Screening (condition at time of admission) Patient's cognitive  ability adequate to safely complete daily activities?: Yes Is the patient deaf or have difficulty hearing?: No Does the patient have difficulty seeing, even when wearing glasses/contacts?: No Does the patient have difficulty concentrating, remembering, or making decisions?: No Patient able to express need for assistance with ADLs?: Yes Does the patient have difficulty dressing or bathing?: No Independently performs ADLs?: Yes (appropriate for developmental age) Does the patient have difficulty walking or climbing stairs?: No Weakness of Legs: None Weakness of Arms/Hands: None  Home Assistive Devices/Equipment Home Assistive Devices/Equipment: None  Therapy Consults (therapy consults require a physician order) PT Evaluation Needed: No OT Evalulation Needed: No SLP Evaluation Needed: No Abuse/Neglect Assessment (Assessment to be complete while patient is alone) Physical Abuse: Denies Verbal Abuse: Yes, past (Comment) (father was verbally abusive-alcoholic) Sexual Abuse: Denies Exploitation of patient/patient's resources: Denies Self-Neglect: Denies Values / Beliefs Cultural Requests During Hospitalization: None Spiritual Requests During Hospitalization: None Consults Spiritual Care Consult Needed: No Social Work Consult Needed: No Regulatory affairs officer (For Healthcare) Advance Directive: Patient does not have advance directive Pre-existing out of facility DNR order (yellow form or pink MOST form): No Nutrition Screen- [facility] Adult/WL/AP Patient's home diet: Regular  Additional Information 1:1 In Past 12 Months?: No CIRT Risk: No Elopement Risk: No Does patient have medical clearance?: Yes  Child/Adolescent Assessment Running Away Risk: Denies Bed-Wetting: Denies Destruction of Property: Denies Cruelty to Animals: Denies Stealing: Denies Rebellious/Defies Authority: Denies Satanic Involvement: Denies Science writer: Denies Problems at [employer]: Denies Gang Involvement:  Denies  Disposition:  Disposition Initial Assessment Completed for this Encounter: Yes Disposition of Patient: Other dispositions (OBS unit) Type of inpatient treatment program: Adult Other disposition(s): Other (Comment) (n/a) Patient referred to: Other (Comment) (OBS Unit)  Dina Rich Diego Cory 06/23/2014 1:47 PM

## 2014-06-23 NOTE — ED Notes (Signed)
Called Pharmacy to expedite meds so patient can be transferred.

## 2014-06-23 NOTE — ED Notes (Signed)
Pt states her last drink was vodka yesterday afternoon around 1500.

## 2014-06-23 NOTE — ED Notes (Signed)
Patient transported by Betsy Pries with all personal belongings.

## 2014-06-23 NOTE — ED Provider Notes (Signed)
CSN: [id number]     Arrival date & time 06/23/14  4098 History   First MD Initiated Contact with Patient 06/23/14 [phone]     Chief Complaint  Patient presents with  . Psychiatric Evaluation     (Consider location/radiation/quality/duration/timing/severity/associated sxs/prior Treatment) HPI Patient is a 68 yo woman with a history of alcoholism. She had been sober for a couple of weeks after having been seen in the ED in late June. She started drinking again 3-4 days ago. She has been drinking the equivalent of 3 bottles of wine per day. However, she drank all the wine in her house and has ingested a bottle of Nyquil over the past 2 days.   The patient says she wants help getting sober. She was a resident of [place] a couple of years ago. She denies recreational drug use. Reports compliance with all of her prescribed medications.   She says she is not feeling suicidal.   Past Medical History  Diagnosis Date  . Alcohol abuse   . Diabetes mellitus   . Hypertension   . Fibromyalgia   . Depression   . Carotid artery stenosis    Past Surgical History  Procedure Laterality Date  . Cesarean section    . Tonsillectomy    . Bunionectomy     Family History  Problem Relation Age of Onset  . Cancer Mother   . Heart disease Father   . Heart disease Sister    History  Substance Use Topics  . Smoking status: Former Smoker    Quit date: 03/11/2010  . Smokeless tobacco: Never Used  . Alcohol Use: Yes     Comment: 4-5 bottles of wine per day   OB History   Grav Para Term Preterm Abortions TAB SAB Ect Mult Living                 Review of Systems Ten point review of symptoms performed and is negative with the exception of symptoms noted above.    Allergies  Other; Latex; Lipitor; Shellfish allergy; Codeine; Pentazocine lactate; and Sulfonamide derivatives  Home Medications   Prior to Admission medications   Medication Sig Start Date End Date Taking? Authorizing Provider   diphenoxylate-atropine (LOMOTIL) 2.5-0.025 MG per tablet Take 1 tablet by mouth 4 (four) times daily as needed for diarrhea or loose stools.    Historical Provider, MD  escitalopram (LEXAPRO) 20 MG tablet Take 20 mg by mouth daily.    Historical Provider, MD  ezetimibe (ZETIA) 10 MG tablet Take 1 tablet (10 mg total) by mouth daily. For cholesterol control 04/18/14   Robbie Lis, MD  JANUVIA 25 MG tablet TAKE 1 TABLET BY MOUTH EVERY DAY    Angelica Chessman, MD  lisinopril (PRINIVIL,ZESTRIL) 5 MG tablet Take 1 tablet (5 mg total) by mouth daily. For hypertension 04/18/14   Robbie Lis, MD  LORazepam (ATIVAN) 1 MG tablet Take 1 tablet (1 mg total) by mouth every 8 (eight) hours as needed for anxiety. 06/10/14   Donne Hazel, MD  metFORMIN (GLUCOPHAGE) 500 MG tablet Take 1.5 tablets (750 mg total) by mouth at bedtime. 04/18/14   Robbie Lis, MD  nebivolol (BYSTOLIC) 5 MG tablet Take 1 tablet (5 mg total) by mouth daily. For hypertension 04/18/14   Robbie Lis, MD   BP 113/70  Pulse 108  Temp(Src) 98.4 F (36.9 C) (Oral)  Resp 18  SpO2 95% Physical Exam Gen: well developed and well nourished appearing, appears intoxicated  Head: NCAT Eyes: PERL, EOMI Nose: no epistaixis or rhinorrhea Mouth/throat: mucosa is moist and pink Neck: supple, no stridor Lungs: CTA B, no wheezing, rhonchi or rales CV: regular rate and rythm, good distal pulses.  Abd: soft, notender, nondistended Back: normal to inspection Skin: warm and dry Ext: no edema, normal to inspection Neuro: CN ii-xii grossly intact, no focal deficits Psyche; strange affect,  calm and cooperative.  ED Course  Procedures (including critical care time) Labs Review  Results for orders placed during the hospital encounter of 06/23/14 (from the past 24 hour(s))  ACETAMINOPHEN LEVEL     Status: None   Collection Time    06/23/14  5:27 AM      Result Value Ref Range   Acetaminophen (Tylenol), Serum <15.0  10 - 30 ug/mL  CBC      Status: None   Collection Time    06/23/14  5:27 AM      Result Value Ref Range   WBC 6.2  4.0 - 10.5 K/uL   RBC 4.21  3.87 - 5.11 MIL/uL   Hemoglobin 13.6  12.0 - 15.0 g/dL   HCT 41.2  36.0 - 46.0 %   MCV 97.9  78.0 - 100.0 fL   MCH 32.3  26.0 - 34.0 pg   MCHC 33.0  30.0 - 36.0 g/dL   RDW 15.5  11.5 - 15.5 %   Platelets 350  150 - 400 K/uL  COMPREHENSIVE METABOLIC PANEL     Status: Abnormal   Collection Time    06/23/14  5:27 AM      Result Value Ref Range   Sodium 138  137 - 147 mEq/L   Potassium 4.0  3.7 - 5.3 mEq/L   Chloride 93 (*) 96 - 112 mEq/L   CO2 20  19 - 32 mEq/L   Glucose, Bld 228 (*) 70 - 99 mg/dL   BUN 8  6 - 23 mg/dL   Creatinine, Ser 0.65  0.50 - 1.10 mg/dL   Calcium 8.7  8.4 - 10.5 mg/dL   Total Protein 8.1  6.0 - 8.3 g/dL   Albumin 4.2  3.5 - 5.2 g/dL   AST 52 (*) 0 - 37 U/L   ALT 30  0 - 35 U/L   Alkaline Phosphatase 100  39 - 117 U/L   Total Bilirubin 0.3  0.3 - 1.2 mg/dL   GFR calc non Af Amer >90  >90 mL/min   GFR calc Af Amer >90  >90 mL/min   Anion gap 25 (*) 5 - 15  ETHANOL     Status: Abnormal   Collection Time    06/23/14  5:27 AM      Result Value Ref Range   Alcohol, Ethyl (B) 187 (*) 0 - 11 mg/dL  SALICYLATE LEVEL     Status: Abnormal   Collection Time    06/23/14  5:27 AM      Result Value Ref Range   Salicylate Lvl <8.3 (*) 2.8 - 20.0 mg/dL   EKG: Sinus tachycardia at 101 beats per minute, no acute ischemic changes, normal intervals, normal axis, normal qrs complex MDM   Patient with acute alcohol intoxication as well as chronic alcohol abuse. She is requesting inpatient detox. APAP level wnl. We will consult TTS for consultation. The patient has been medically cleared. The patient is here voluntarily and does not meet IVC criteria.     Elyn Peers, MD 06/23/14 [phone]

## 2014-06-23 NOTE — ED Notes (Signed)
Attempted to call report to [facility].

## 2014-06-23 NOTE — ED Provider Notes (Signed)
Pt accepted to [facility] by Dr. Ester Rink, MD 06/23/14 [phone]

## 2014-06-24 LAB — GLUCOSE, CAPILLARY: Glucose-Capillary: 157 mg/dL — ABNORMAL HIGH (ref 70–99)

## 2014-06-24 MED ORDER — ESCITALOPRAM OXALATE 20 MG PO TABS: 20.0000 mg | ORAL_TABLET | Freq: Every day | ORAL | Status: DC

## 2014-06-24 MED ORDER — B COMPLEX PO TABS: 1.0000 | ORAL_TABLET | Freq: Every day | ORAL | Status: DC

## 2014-06-24 MED ORDER — SITAGLIPTIN PHOSPHATE 25 MG PO TABS: 25.0000 mg | ORAL_TABLET | Freq: Every day | ORAL | Status: DC

## 2014-06-24 NOTE — Plan of Care (Signed)
[place] Observation Crisis Plan  Reason for Crisis Plan:  Substance Abuse   Plan of Care:  To follow up with Charlene May, MD, pt's current outpatient provider  Family Support:    None; pt reports that she is going through a divorce, and that her family lives in [place].  Current Living Environment:  Living Arrangements: Alone  Insurance:   Hospital Account   Name Acct ID Class Status Primary Coverage   [place], Hohmann [id number] [place]        Guarantor Account (for Hospital Account [id number])   Name Relation to Pt Service Area Active? Acct Type   Gwenevere Ghazi A Self [facility] Yes [facility]   Address Phone       [place],  [address] [phone])          Coverage Information (for Hospital Account [id number])   [place]   F/O Payor/Plan Precert #   [facility] HEALTHCARE    Subscriber Subscriber #   [name], Charlene [id number]   Address Phone   [address] [place] [address] [phone]       2. [place] [facility] PART A AND B   F/O Payor/Plan Precert #   MEDICARE/MEDICARE PART A AND B    Subscriber Subscriber #   [name], Charlene [id number]   Address Phone   [address] [place], [place] [address]           Legal Guardian:   Self  Primary Care Provider:  Pcp Not In System; None currently  Current Outpatient Providers:  Charlene May, MD  Psychiatrist:    Chucky May, MD  Counselor/Therapist:    None currently  Compliant with Medications:  Yes  Additional Information: After consulting with [name], NP it has been determined that pt does not present a life threatening danger to herself or others, and that psychiatric hospitalization is not indicated for her at this time.  It is recommended that she be referred to an outpatient provider.  Pt had initially reported dissatisfaction with  Charlene May, MD, her current psychiatrist, but after discussing options with her, she opts to maintain her treatment through Dr Toy Care.  She also reports that she has seek [name] for counseling at the same office in the past, and she [name] choose to resume her services.  Pt reports that she has transportation problems that make other options problematic.  Pt signed Consent to Release Information to Dr Toy Care.  At 15:31 I called Dr [name] office and spoke to [name], both to notify her of pt's visit to the Observation Unit, and to find out when her next appointment is scheduled.  [name] informs me that pt's next appointment is on Saturday, 07/13/2014 at 13:30.  This information will be included in pt's discharge instructions.  I will also include the website for [facility] transportation services through [place].   [name] 7/13/20153:30 PM

## 2014-06-24 NOTE — Discharge Summary (Signed)
Patient seen, evaluated by me, patient can be discharged as she has detoxed from alcohol. Patient has been given all outpatient resources for followup

## 2014-06-24 NOTE — H&P (Signed)
Patient seen, evaluated by me, treatment plan formulated by me.

## 2014-06-24 NOTE — Progress Notes (Signed)
Pt d/c from the hospital with her spouse. All items returned. D/C instructions given. Pt denies si and hi.

## 2014-06-24 NOTE — Discharge Summary (Signed)
[facility] OBS DISCHARGE SUMMARY & SRA   Patient Identification:  [name] Date of Evaluation:  06/24/2014 Chief Complaint:  ETOH DEPENDENCE  Subjective: Pt seen and chart reviewed. Pt denies SI, HI, and AVH, contracts for safety. Pt reports a recent relapse of alcoholism and wants to work on treatment options outpatient upon discharge.   History of Present Illness:: [name] is an 68 y.o. female who presented to [facility] for alcohol detox. Patient reports no SI/HI/AVH. Patient states that she could not stop drinking for the past 5-6 days to the point of experiencing feeling physical withdrawal symptoms. Patient identifies current stressors as: going through divorce after 26 years of marriage and upcoming 10 year anniversary son's suicide. Patient reports frequent relapses over the past two years with several admissions for detox and treatment at [facility], [facility], and other treatment centers. Patient unable to maintain sobriety for substantial length of time. She reports drinking 2-4pks of mini wine bottles daily for the past 5-6 days. She reports ongoing alcohol use for the last two years with frequent binging with no other substance use identified. Patient states that she has difficulty finding transportation to [employer] and to current outpatient provider, Dr. Toy Care for medication management. Patient reports no drivers license due to past DUI and pending charge of driving without a license. She reports limited support due to divorce and daughter living out of state. She worries that her alcohol use is affecting physical health in a negative way and is seeking detox. At this time, the patient states that she wants to detox, return home, and continue follow-up with Dr. Toy Care for medication management/attend [place] meetings. CSW ran patient by Charmaine Downs, NP and Debarah Crape, [facility] and deemed patient appropriate for OBS unit. CSW contacted Santiago Glad, RN to inform her of disposition.   Total Time  spent with patient: 45 minutes  Psychiatric Specialty Exam: Physical Exam Full Physical Exam performed in ED; reviewed, stable, and I concur with this assessment.   Review of Systems  Constitutional: Negative.   HENT: Negative.   Eyes: Negative.   Respiratory: Negative.   Cardiovascular: Negative.   Gastrointestinal: Negative.   Genitourinary: Negative.   Musculoskeletal: Negative.   Skin: Negative.   Neurological: Negative.   Endo/Heme/Allergies: Negative.   Psychiatric/Behavioral: Positive for depression. The patient is nervous/anxious.     Blood pressure 119/74, pulse 80, temperature 98.3 F (36.8 C), temperature source Oral, resp. rate 16, height 5\' 3"  (1.6 m), weight 71.215 kg (157 lb), SpO2 96.00%.Body mass index is 27.82 kg/(m^2).  General Appearance: Fairly Groomed  Engineer, water::  Good  Speech:  Clear and Coherent  Volume:  Normal  Mood:  Anxious and Depressed  Affect:  Appropriate and Congruent  Thought Process:  Coherent and Goal Directed  Orientation:  Full (Time, Place, and Person)  Thought Content:  WDL  Suicidal Thoughts:  No  Homicidal Thoughts:  No  Memory:  Immediate;   Fair Recent;   Fair Remote;   Fair  Judgement:  Fair  Insight:  Fair  Psychomotor Activity:  Normal  Concentration:  Good  Recall:  [employer] [place]  Language: Fair  Akathisia:  No  Handed:    AIMS (if indicated):     Assets:  Communication Skills Desire for Improvement Resilience  Sleep:       Musculoskeletal: Strength & Muscle Tone: within normal limits Gait & Station: unsteady Patient leans: N/A  Past Medical History:   Past Medical History  Diagnosis Date  . Alcohol abuse   . Diabetes mellitus   . Hypertension   . Fibromyalgia   . Depression   . Carotid artery stenosis    None. Allergies:   Allergies  Allergen Reactions  . Other Anaphylaxis    shellfish  . Shellfish Allergy Anaphylaxis  . Latex Other (See Comments)    Break outs  . Lipitor  [Atorvastatin Calcium] Other (See Comments)    Muscle aches  . Codeine Rash    And nausea   . Pentazocine Lactate Rash  . Sulfonamide Derivatives Rash   PTA Medications: Prescriptions prior to admission  Medication Sig Dispense Refill  . diphenoxylate-atropine (LOMOTIL) 2.5-0.025 MG per tablet Take 1 tablet by mouth 4 (four) times daily as needed for diarrhea or loose stools.      . ezetimibe (ZETIA) 10 MG tablet Take 1 tablet (10 mg total) by mouth daily. For cholesterol control  90 tablet  0  . lisinopril (PRINIVIL,ZESTRIL) 5 MG tablet Take 1 tablet (5 mg total) by mouth daily. For hypertension  90 tablet  0  . LORazepam (ATIVAN) 1 MG tablet Take 1 tablet (1 mg total) by mouth every 8 (eight) hours as needed for anxiety.  15 tablet  0  . nebivolol (BYSTOLIC) 5 MG tablet Take 1 tablet (5 mg total) by mouth daily. For hypertension  90 tablet  0  . [DISCONTINUED] b complex vitamins tablet Take 1 tablet by mouth daily.      . [DISCONTINUED] escitalopram (LEXAPRO) 20 MG tablet Take 20 mg by mouth daily.      . [DISCONTINUED] sitaGLIPtin (JANUVIA) 25 MG tablet Take 25 mg by mouth daily.        Previous Psychotropic Medications:  Medication/Dose  SEE MAR               Substance Abuse History in the last 12 months:  Yes.    Consequences of Substance Abuse:  Medical Consequences:  Hospitalization   Social History:  reports that she quit smoking about 4 years ago. She has never used smokeless tobacco. She reports that she drinks alcohol. She reports that she does not use illicit drugs. Additional Social History:    Family History:   Family History  Problem Relation Age of Onset  . Cancer Mother   . Heart disease Father   . Heart disease Sister     Results for orders placed during the hospital encounter of 06/23/14 (from the past 72 hour(s))  GLUCOSE, CAPILLARY     Status: Abnormal   Collection Time    06/23/14  9:47 PM      Result Value Ref Range   Glucose-Capillary 126  (*) 70 - 99 mg/dL   Comment 1 Documented in Chart    GLUCOSE, CAPILLARY     Status: Abnormal   Collection Time    06/24/14 11:19 AM      Result Value Ref Range   Glucose-Capillary 157 (*) 70 - 99 mg/dL   Psychological Evaluations:  Assessment:   DSM5:  Substance/Addictive Disorders:  Alcohol Related Disorder - Moderate (303.90) Depressive Disorders:  Major Depressive Disorder - Severe (296.23)  AXIS I:  Alcohol Abuse, Major Depression, Recurrent severe and Substance Induced Mood Disorder AXIS II:  Deferred AXIS III:   Past Medical History  Diagnosis Date  . Alcohol abuse   . Diabetes mellitus   . Hypertension   . Fibromyalgia   . Depression   . Carotid artery stenosis    AXIS IV:  other psychosocial or environmental problems and problems related to social environment AXIS V:  51-60 moderate symptoms  Treatment Plan/Recommendations:   Review of chart, vital signs, medications, and notes.  1-Medication management for depression and anxiety: Medications reviewed with the patient and she stated no untoward effects, unchanged. 2-Coping skills for depression, anxiety  3-Continue crisis stabilization and management  4-Address health issues--monitoring vital signs, stable  5-Treatment plan in progress to prevent relapse of depression and anxiety  Treatment Plan Summary: Daily contact with patient to assess and evaluate symptoms and progress in treatment Medication management -Discharge home with outpatient referrals for psychiatry and counseling.   Current Medications:  Current Facility-Administered Medications  Medication Dose Route Frequency Provider Last Rate Last Dose  . acetaminophen (TYLENOL) tablet 650 mg  650 mg Oral Q6H PRN Hampton Abbot, MD      . alum & mag hydroxide-simeth (MAALOX/MYLANTA) 200-200-20 MG/5ML suspension 30 mL  30 mL Oral Q4H PRN Hampton Abbot, MD      . chlordiazePOXIDE (LIBRIUM) capsule 25 mg  25 mg Oral Q6H PRN Hampton Abbot, MD      .  chlordiazePOXIDE (LIBRIUM) capsule 25 mg  25 mg Oral QID Hampton Abbot, MD   25 mg at 06/24/14 1134   Followed by  . [START ON 06/25/2014] chlordiazePOXIDE (LIBRIUM) capsule 25 mg  25 mg Oral TID Hampton Abbot, MD       Followed by  . [START ON 06/26/2014] chlordiazePOXIDE (LIBRIUM) capsule 25 mg  25 mg Oral BH-qamhs Hampton Abbot, MD       Followed by  . [START ON 06/27/2014] chlordiazePOXIDE (LIBRIUM) capsule 25 mg  25 mg Oral Daily Hampton Abbot, MD      . hydrOXYzine (ATARAX/VISTARIL) tablet 25 mg  25 mg Oral Q6H PRN Hampton Abbot, MD      . linagliptin (TRADJENTA) tablet 5 mg  5 mg Oral Daily Hampton Abbot, MD   5 mg at 06/24/14 5366  . loperamide (IMODIUM) capsule 2-4 mg  2-4 mg Oral PRN Hampton Abbot, MD      . magnesium hydroxide (MILK OF MAGNESIA) suspension 30 mL  30 mL Oral Daily PRN Hampton Abbot, MD      . multivitamin with minerals tablet 1 tablet  1 tablet Oral Daily Hampton Abbot, MD   1 tablet at 06/24/14 0815  . nebivolol (BYSTOLIC) tablet 5 mg  5 mg Oral Daily Hampton Abbot, MD   5 mg at 06/24/14 0815  . nicotine (NICODERM CQ - dosed in mg/24 hours) patch 21 mg  21 mg Transdermal Q0600 Hampton Abbot, MD      . ondansetron (ZOFRAN-ODT) disintegrating tablet 4 mg  4 mg Oral Q6H PRN Hampton Abbot, MD   4 mg at 06/23/14 1800  . thiamine (B-1) injection 100 mg  100 mg Intramuscular Once Hampton Abbot, MD      . thiamine (VITAMIN B-1) tablet 100 mg  100 mg Oral Daily Hampton Abbot, MD   100 mg at 06/24/14 0815     Suicide Risk Assessment     Nursing information obtained from:    Demographic factors:    Current Mental Status:    Loss Factors:    Historical Factors:    Risk Reduction Factors:    Total Time spent with patient: 45 minutes  CLINICAL FACTORS:   Depression:   Anhedonia Comorbid alcohol abuse/dependence Hopelessness Insomnia Alcohol/Substance Abuse/Dependencies  Psychiatric Specialty Exam:     Blood pressure 119/74, pulse 80, temperature 98.3 F (36.8 C),  temperature source  Oral, resp. rate 16, height 5\' 3"  (1.6 m), weight 71.215 kg (157 lb), SpO2 96.00%.Body mass index is 27.82 kg/(m^2).   SEE PSE ABOVE  COGNITIVE FEATURES THAT CONTRIBUTE TO RISK:  Closed-mindedness    SUICIDE RISK:   Minimal: No identifiable suicidal ideation.  Patients presenting with no risk factors but with morbid ruminations; may be classified as minimal risk based on the severity of the depressive symptoms   Benjamine Mola, FNP-BC 06/24/2014, 3:46 PM

## 2014-06-24 NOTE — Discharge Instructions (Signed)
For your ongoing behavioral health needs, continue your treatment with Chucky May, MD.  Your next scheduled appointment is on Saturday, 07/13/2014 at 1:30 pm.    [facility] [address] [place], [place] [address] [phone]  For your transportation needs, you may qualify for SCAT services through [facility].  Visit their website to find out about their services and to access applications:  www.[place].gov/index.aspx?page=2188  If you have a behavioral health crisis you have several options, all of which are available 24 hours a day, 7 days a week:  *Call Mobile Crisis and a clinician will come to you: [phone] *Call 911 *Go to the [facility] at [place]. [place], [place] *Go to your local hospital emergency department

## 2014-06-24 NOTE — H&P (Signed)
[facility] OBS UNIT H&P  Patient Identification:  [name] Date of Evaluation:  06/24/2014 Chief Complaint:  ETOH DEPENDENCE  Subjective: Pt seen and chart reviewed. Pt denies SI, HI, and AVH, contracts for safety. Pt reports a recent relapse of alcoholism and wants to work on treatment options outpatient upon discharge.   History of Present Illness:: [name] is an 67 y.o. female who presented to [facility] for alcohol detox. Patient reports no SI/HI/AVH. Patient states that she could not stop drinking for the past 5-6 days to the point of experiencing feeling physical withdrawal symptoms. Patient identifies current stressors as: going through divorce after 59 years of marriage and upcoming 10 year anniversary son's suicide. Patient reports frequent relapses over the past two years with several admissions for detox and treatment at [facility], [facility], and other treatment centers. Patient unable to maintain sobriety for substantial length of time. She reports drinking 2-4pks of mini wine bottles daily for the past 5-6 days. She reports ongoing alcohol use for the last two years with frequent binging with no other substance use identified. Patient states that she has difficulty finding transportation to [employer] and to current outpatient provider, Dr. Toy Care for medication management. Patient reports no drivers license due to past DUI and pending charge of driving without a license. She reports limited support due to divorce and daughter living out of state. She worries that her alcohol use is affecting physical health in a negative way and is seeking detox. At this time, the patient states that she wants to detox, return home, and continue follow-up with Dr. Toy Care for medication management/attend [place] meetings. CSW ran patient by Charmaine Downs, NP and Debarah Crape, [facility] and deemed patient appropriate for OBS unit. CSW contacted Santiago Glad, RN to inform her of disposition.   Total Time spent with patient:  45 minutes  Psychiatric Specialty Exam: Physical Exam Full Physical Exam performed in ED; reviewed, stable, and I concur with this assessment.   Review of Systems  Constitutional: Negative.   HENT: Negative.   Eyes: Negative.   Respiratory: Negative.   Cardiovascular: Negative.   Gastrointestinal: Negative.   Genitourinary: Negative.   Musculoskeletal: Negative.   Skin: Negative.   Neurological: Negative.   Endo/Heme/Allergies: Negative.   Psychiatric/Behavioral: Positive for depression. The patient is nervous/anxious.     Blood pressure 119/74, pulse 80, temperature 98.3 F (36.8 C), temperature source Oral, resp. rate 16, height 5\' 3"  (1.6 m), weight 71.215 kg (157 lb), SpO2 96.00%.Body mass index is 27.82 kg/(m^2).  General Appearance: Fairly Groomed  Engineer, water::  Good  Speech:  Clear and Coherent  Volume:  Normal  Mood:  Anxious and Depressed  Affect:  Appropriate and Congruent  Thought Process:  Coherent and Goal Directed  Orientation:  Full (Time, Place, and Person)  Thought Content:  WDL  Suicidal Thoughts:  No  Homicidal Thoughts:  No  Memory:  Immediate;   Fair Recent;   Fair Remote;   Fair  Judgement:  Fair  Insight:  Fair  Psychomotor Activity:  Normal  Concentration:  Good  Recall:  [employer] [place]  Language: Fair  Akathisia:  No  Handed:    AIMS (if indicated):     Assets:  Communication Skills Desire for Improvement Resilience  Sleep:       Musculoskeletal: Strength & Muscle Tone: within normal limits Gait & Station: unsteady Patient leans: N/A  Past Medical History:   Past Medical History  Diagnosis Date  .  Alcohol abuse   . Diabetes mellitus   . Hypertension   . Fibromyalgia   . Depression   . Carotid artery stenosis    None. Allergies:   Allergies  Allergen Reactions  . Other Anaphylaxis    shellfish  . Shellfish Allergy Anaphylaxis  . Latex Other (See Comments)    Break outs  . Lipitor [Atorvastatin Calcium]  Other (See Comments)    Muscle aches  . Codeine Rash    And nausea   . Pentazocine Lactate Rash  . Sulfonamide Derivatives Rash   PTA Medications: Prescriptions prior to admission  Medication Sig Dispense Refill  . diphenoxylate-atropine (LOMOTIL) 2.5-0.025 MG per tablet Take 1 tablet by mouth 4 (four) times daily as needed for diarrhea or loose stools.      . ezetimibe (ZETIA) 10 MG tablet Take 1 tablet (10 mg total) by mouth daily. For cholesterol control  90 tablet  0  . lisinopril (PRINIVIL,ZESTRIL) 5 MG tablet Take 1 tablet (5 mg total) by mouth daily. For hypertension  90 tablet  0  . LORazepam (ATIVAN) 1 MG tablet Take 1 tablet (1 mg total) by mouth every 8 (eight) hours as needed for anxiety.  15 tablet  0  . nebivolol (BYSTOLIC) 5 MG tablet Take 1 tablet (5 mg total) by mouth daily. For hypertension  90 tablet  0  . [DISCONTINUED] b complex vitamins tablet Take 1 tablet by mouth daily.      . [DISCONTINUED] escitalopram (LEXAPRO) 20 MG tablet Take 20 mg by mouth daily.      . [DISCONTINUED] sitaGLIPtin (JANUVIA) 25 MG tablet Take 25 mg by mouth daily.        Previous Psychotropic Medications:  Medication/Dose  SEE MAR               Substance Abuse History in the last 12 months:  Yes.    Consequences of Substance Abuse: Medical Consequences:  Hospitalization  Social History:  reports that she quit smoking about 4 years ago. She has never used smokeless tobacco. She reports that she drinks alcohol. She reports that she does not use illicit drugs. Additional Social History:                      Family History:   Family History  Problem Relation Age of Onset  . Cancer Mother   . Heart disease Father   . Heart disease Sister     Results for orders placed during the hospital encounter of 06/23/14 (from the past 72 hour(s))  GLUCOSE, CAPILLARY     Status: Abnormal   Collection Time    06/23/14  9:47 PM      Result Value Ref Range   Glucose-Capillary  126 (*) 70 - 99 mg/dL   Comment 1 Documented in Chart    GLUCOSE, CAPILLARY     Status: Abnormal   Collection Time    06/24/14 11:19 AM      Result Value Ref Range   Glucose-Capillary 157 (*) 70 - 99 mg/dL   Psychological Evaluations:  Assessment:   DSM5:  Substance/Addictive Disorders:  Alcohol Related Disorder - Moderate (303.90) Depressive Disorders:  Major Depressive Disorder - Severe (296.23)  AXIS I:  Alcohol Abuse, Major Depression, Recurrent severe and Substance Induced Mood Disorder AXIS II:  Deferred AXIS III:   Past Medical History  Diagnosis Date  . Alcohol abuse   . Diabetes mellitus   . Hypertension   . Fibromyalgia   . Depression   .  Carotid artery stenosis    AXIS IV:  other psychosocial or environmental problems and problems related to social environment AXIS V:  51-60 moderate symptoms  Treatment Plan/Recommendations:   Review of chart, vital signs, medications, and notes.  1-Medication management for depression and anxiety: Medications reviewed with the patient and she stated no untoward effects, unchanged. 2-Coping skills for depression, anxiety  3-Continue crisis stabilization and management  4-Address health issues--monitoring vital signs, stable  5-Treatment plan in progress to prevent relapse of depression and anxiety  Treatment Plan Summary: Daily contact with patient to assess and evaluate symptoms and progress in treatment Medication management -Discharge home with outpatient referrals for psychiatry and counseling.   Current Medications:  Current Facility-Administered Medications  Medication Dose Route Frequency Provider Last Rate Last Dose  . acetaminophen (TYLENOL) tablet 650 mg  650 mg Oral Q6H PRN Hampton Abbot, MD      . alum & mag hydroxide-simeth (MAALOX/MYLANTA) 200-200-20 MG/5ML suspension 30 mL  30 mL Oral Q4H PRN Hampton Abbot, MD      . chlordiazePOXIDE (LIBRIUM) capsule 25 mg  25 mg Oral Q6H PRN Hampton Abbot, MD      .  chlordiazePOXIDE (LIBRIUM) capsule 25 mg  25 mg Oral QID Hampton Abbot, MD   25 mg at 06/24/14 1134   Followed by  . [START ON 06/25/2014] chlordiazePOXIDE (LIBRIUM) capsule 25 mg  25 mg Oral TID Hampton Abbot, MD       Followed by  . [START ON 06/26/2014] chlordiazePOXIDE (LIBRIUM) capsule 25 mg  25 mg Oral BH-qamhs Hampton Abbot, MD       Followed by  . [START ON 06/27/2014] chlordiazePOXIDE (LIBRIUM) capsule 25 mg  25 mg Oral Daily Hampton Abbot, MD      . hydrOXYzine (ATARAX/VISTARIL) tablet 25 mg  25 mg Oral Q6H PRN Hampton Abbot, MD      . linagliptin (TRADJENTA) tablet 5 mg  5 mg Oral Daily Hampton Abbot, MD   5 mg at 06/24/14 4034  . loperamide (IMODIUM) capsule 2-4 mg  2-4 mg Oral PRN Hampton Abbot, MD      . magnesium hydroxide (MILK OF MAGNESIA) suspension 30 mL  30 mL Oral Daily PRN Hampton Abbot, MD      . multivitamin with minerals tablet 1 tablet  1 tablet Oral Daily Hampton Abbot, MD   1 tablet at 06/24/14 0815  . nebivolol (BYSTOLIC) tablet 5 mg  5 mg Oral Daily Hampton Abbot, MD   5 mg at 06/24/14 0815  . nicotine (NICODERM CQ - dosed in mg/24 hours) patch 21 mg  21 mg Transdermal Q0600 Hampton Abbot, MD      . ondansetron (ZOFRAN-ODT) disintegrating tablet 4 mg  4 mg Oral Q6H PRN Hampton Abbot, MD   4 mg at 06/23/14 1800  . thiamine (B-1) injection 100 mg  100 mg Intramuscular Once Hampton Abbot, MD      . thiamine (VITAMIN B-1) tablet 100 mg  100 mg Oral Daily Hampton Abbot, MD   100 mg at 06/24/14 0815   Benjamine Mola, FNP-BC 7/13/20153:33 PM

## 2014-06-24 NOTE — Progress Notes (Signed)
D:Pt reports that she wants help with alcohol detox. Pt reports no withdrawal symptoms st this time. Mild tremor felt in her hands. She has a sullen anxious affect.  A:Offered support, encouragement and 15 minute checks. R:Pt denies si and hi. Safety maintained in the observation unit.

## 2014-06-25 ENCOUNTER — Encounter (HOSPITAL_COMMUNITY): Payer: Self-pay | Admitting: Emergency Medicine

## 2014-06-25 ENCOUNTER — Ambulatory Visit: Payer: Self-pay | Admitting: Internal Medicine

## 2014-06-25 ENCOUNTER — Emergency Department (HOSPITAL_COMMUNITY)
Admission: EM | Admit: 2014-06-25 | Discharge: 2014-06-25 | Disposition: A | Discharge status: Home / Self Care | Payer: 59 | Attending: Emergency Medicine | Admitting: Emergency Medicine

## 2014-06-25 ENCOUNTER — Other Ambulatory Visit: Payer: Self-pay | Admitting: *Deleted

## 2014-06-25 DIAGNOSIS — F329 Major depressive disorder, single episode, unspecified: Secondary | ICD-10-CM | POA: Insufficient documentation

## 2014-06-25 DIAGNOSIS — F10939 Alcohol use, unspecified with withdrawal, unspecified: Secondary | ICD-10-CM | POA: Insufficient documentation

## 2014-06-25 DIAGNOSIS — F10239 Alcohol dependence with withdrawal, unspecified: Secondary | ICD-10-CM | POA: Insufficient documentation

## 2014-06-25 DIAGNOSIS — F1010 Alcohol abuse, uncomplicated: Secondary | ICD-10-CM | POA: Insufficient documentation

## 2014-06-25 DIAGNOSIS — Z91040 Latex allergy status: Secondary | ICD-10-CM | POA: Insufficient documentation

## 2014-06-25 DIAGNOSIS — Z87891 Personal history of nicotine dependence: Secondary | ICD-10-CM | POA: Insufficient documentation

## 2014-06-25 DIAGNOSIS — E119 Type 2 diabetes mellitus without complications: Secondary | ICD-10-CM | POA: Insufficient documentation

## 2014-06-25 DIAGNOSIS — F3289 Other specified depressive episodes: Secondary | ICD-10-CM | POA: Insufficient documentation

## 2014-06-25 DIAGNOSIS — I6529 Occlusion and stenosis of unspecified carotid artery: Secondary | ICD-10-CM | POA: Insufficient documentation

## 2014-06-25 DIAGNOSIS — F10930 Alcohol use, unspecified with withdrawal, uncomplicated: Secondary | ICD-10-CM

## 2014-06-25 DIAGNOSIS — F10230 Alcohol dependence with withdrawal, uncomplicated: Secondary | ICD-10-CM

## 2014-06-25 DIAGNOSIS — IMO0001 Reserved for inherently not codable concepts without codable children: Secondary | ICD-10-CM | POA: Insufficient documentation

## 2014-06-25 DIAGNOSIS — I10 Essential (primary) hypertension: Secondary | ICD-10-CM | POA: Insufficient documentation

## 2014-06-25 DIAGNOSIS — Z79899 Other long term (current) drug therapy: Secondary | ICD-10-CM | POA: Insufficient documentation

## 2014-06-25 DIAGNOSIS — F411 Generalized anxiety disorder: Secondary | ICD-10-CM | POA: Insufficient documentation

## 2014-06-25 LAB — COMPREHENSIVE METABOLIC PANEL
ALT: 17 U/L (ref 0–35)
AST: 19 U/L (ref 0–37)
Albumin: 3.9 g/dL (ref 3.5–5.2)
Alkaline Phosphatase: 99 U/L (ref 39–117)
Anion gap: 17 — ABNORMAL HIGH (ref 5–15)
BUN: 16 mg/dL (ref 6–23)
CO2: 23 mEq/L (ref 19–32)
Calcium: 9.1 mg/dL (ref 8.4–10.5)
Chloride: 102 mEq/L (ref 96–112)
Creatinine, Ser: 0.57 mg/dL (ref 0.50–1.10)
GFR calc Af Amer: >90 mL/min (ref >90)
GFR calc non Af Amer: >90 mL/min (ref >90)
Glucose, Bld: 177 mg/dL — ABNORMAL HIGH (ref 70–99)
Potassium: 4.7 mEq/L (ref 3.7–5.3)
Sodium: 142 mEq/L (ref 137–147)
Total Bilirubin: <0.2 mg/dL — ABNORMAL LOW (ref 0.3–1.2)
Total Protein: 7.4 g/dL (ref 6.0–8.3)

## 2014-06-25 LAB — CBC
HCT: 37 % (ref 36.0–46.0)
Hemoglobin: 12.2 g/dL (ref 12.0–15.0)
MCH: 32 pg (ref 26.0–34.0)
MCHC: 33 g/dL (ref 30.0–36.0)
MCV: 97.1 fL (ref 78.0–100.0)
Platelets: 259 10*3/uL (ref 150–400)
RBC: 3.81 MIL/uL — ABNORMAL LOW (ref 3.87–5.11)
RDW: 15.3 % (ref 11.5–15.5)
WBC: 7.7 10*3/uL (ref 4.0–10.5)

## 2014-06-25 LAB — SALICYLATE LEVEL: Salicylate Lvl: <2 mg/dL — ABNORMAL LOW (ref 2.8–20.0)

## 2014-06-25 LAB — ETHANOL: Alcohol, Ethyl (B): 279 mg/dL — ABNORMAL HIGH (ref 0–11)

## 2014-06-25 LAB — ACETAMINOPHEN LEVEL: Acetaminophen (Tylenol), Serum: <15 ug/mL (ref 10–30)

## 2014-06-25 MED ORDER — LORAZEPAM 1 MG PO TABS: 1.0000 mg | ORAL_TABLET | Freq: Four times a day (QID) | ORAL | Status: DC | PRN

## 2014-06-25 MED ORDER — LORAZEPAM 1 MG PO TABS
2.0000 mg | ORAL_TABLET | Freq: Once | ORAL | Status: AC
  Administered 2014-06-25: 2 mg via ORAL
  Filled 2014-06-25: qty 2

## 2014-06-25 NOTE — ED Notes (Signed)
Pt screaming out. Informed pt to not scream.  Pt dressed and wanting to leave. Pt walking out EMS doors. Refused to stay. Charge RN aware.

## 2014-06-25 NOTE — ED Provider Notes (Signed)
CSN: [id number]     Arrival date & time 06/25/14  1526 History   First MD Initiated Contact with Patient 06/25/14 1654     Chief Complaint  Patient presents with  . Withdrawal    alcohol withdrawal     (Consider location/radiation/quality/duration/timing/severity/associated sxs/prior Treatment) HPI Comments: Pt comes in with cc of alcohol withdrawals. She was just discharged y'day, after spending 2 days at the detox. She reports that she got iv ativan the 1st day, oral librium the 2nd day and then discharged y'day evening. She started getting clammy, shaky and anxious this morning. She attributed that feeling to benzo withdrawal since she had received a lot of it during her stay. She drank alcohol to take the edge off of her symptoms and comes to the ER. She has no SI, HI, no tactile symptoms and no hallucinations. Pt has a BAL of 279. She has no palpitations or shaking currently.  The history is provided by the patient and medical records.    Past Medical History  Diagnosis Date  . Alcohol abuse   . Diabetes mellitus   . Hypertension   . Fibromyalgia   . Depression   . Carotid artery stenosis    Past Surgical History  Procedure Laterality Date  . Cesarean section    . Tonsillectomy    . Bunionectomy     Family History  Problem Relation Age of Onset  . Cancer Mother   . Heart disease Father   . Heart disease Sister    History  Substance Use Topics  . Smoking status: Former Smoker    Quit date: 03/11/2010  . Smokeless tobacco: Never Used  . Alcohol Use: Yes     Comment: 4-5 bottles of wine per day   OB History   Grav Para Term Preterm Abortions TAB SAB Ect Mult Living                 Review of Systems  Constitutional: Negative for activity change.  Respiratory: Negative for shortness of breath.   Cardiovascular: Negative for chest pain.  Gastrointestinal: Negative for nausea, vomiting and abdominal pain.  Genitourinary: Negative for dysuria.  Musculoskeletal:  Negative for neck pain.  Neurological: Negative for headaches.  Psychiatric/Behavioral: Negative for suicidal ideas, confusion and self-injury. The patient is nervous/anxious.       Allergies  Other; Shellfish allergy; Latex; Lipitor; Codeine; Pentazocine lactate; and Sulfonamide derivatives  Home Medications   Prior to Admission medications   Medication Sig Start Date End Date Taking? Authorizing Provider  b complex vitamins tablet Take 1 tablet by mouth daily.   Yes Historical Provider, MD  diphenoxylate-atropine (LOMOTIL) 2.5-0.025 MG per tablet Take 1 tablet by mouth 4 (four) times daily as needed for diarrhea or loose stools.   Yes Historical Provider, MD  escitalopram (LEXAPRO) 20 MG tablet Take 20 mg by mouth daily.   Yes Historical Provider, MD  ezetimibe (ZETIA) 10 MG tablet Take 10 mg by mouth daily.   Yes Historical Provider, MD  lisinopril (PRINIVIL,ZESTRIL) 5 MG tablet Take 5 mg by mouth daily.   Yes Historical Provider, MD  LORazepam (ATIVAN) 1 MG tablet Take 1 mg by mouth every 8 (eight) hours as needed for anxiety.   Yes Historical Provider, MD  nebivolol (BYSTOLIC) 5 MG tablet Take 5 mg by mouth daily.   Yes Historical Provider, MD  sitaGLIPtin (JANUVIA) 25 MG tablet Take 25 mg by mouth daily.   Yes Historical Provider, MD  LORazepam (ATIVAN) 1 MG tablet  Take 1 tablet (1 mg total) by mouth every 6 (six) hours as needed for anxiety. 06/25/14   Kevona Lupinacci, MD   BP 133/65  Pulse 104  Temp(Src) 98.1 F (36.7 C) (Oral)  Resp 24  SpO2 97% Physical Exam  Nursing note and vitals reviewed. Constitutional: She is oriented to person, place, and time. She appears well-developed and well-nourished.  HENT:  Head: Normocephalic and atraumatic.  Eyes: EOM are normal. Pupils are equal, round, and reactive to light.  Neck: Neck supple.  Cardiovascular: Normal rate, regular rhythm and normal heart sounds.   No murmur heard. Pulmonary/Chest: Effort normal. No respiratory  distress.  Abdominal: Soft. She exhibits no distension. There is no tenderness. There is no rebound and no guarding.  Neurological: She is alert and oriented to person, place, and time.  Skin: Skin is warm and dry.    ED Course  Procedures (including critical care time) Labs Review Labs Reviewed  CBC - Abnormal; Notable for the following:    RBC 3.81 (*)    All other components within normal limits  COMPREHENSIVE METABOLIC PANEL - Abnormal; Notable for the following:    Glucose, Bld 177 (*)    Total Bilirubin <0.2 (*)    Anion gap 17 (*)    All other components within normal limits  ETHANOL - Abnormal; Notable for the following:    Alcohol, Ethyl (B) 279 (*)    All other components within normal limits  SALICYLATE LEVEL - Abnormal; Notable for the following:    Salicylate Lvl <5.8 (*)    All other components within normal limits  ACETAMINOPHEN LEVEL    Imaging Review No results found.   EKG Interpretation None      MDM   Final diagnoses:  Alcohol withdrawal, uncomplicated    Patient is clinically sober. She is talking coherently, gait is normal, and is demonstrating rational thought process. She is not showing any acute signs of withdrawals. She is autonomically stable and normal, she has no tremors, no confusion, no hallucinations.  We shall discharge her shortly, and we have discussed the warning signs of alcohol withdrawal with her verbally, and the information will be provided with the discharge instructions as well.  Pt will be going home with husband.    Varney Biles, MD 06/25/14 [phone]

## 2014-06-25 NOTE — ED Notes (Signed)
Pt requesting to leave. Sts "We don't think this is going to help me. Why hasn't a doctor been in to see my?" Pt is in room alone, sts she and her husband would like to go home.  Informed pt of delay in care.

## 2014-06-25 NOTE — ED Notes (Addendum)
Pt c/o anxiety and ETOH withdrawal.  Recently discharged from detox at [facility]. Pt reports being seen at [facility] for 2 days and sent home on ativan and librium.  Pt states"They give me all these medications and send me home. Then tell me to see my psychiatrist." Pt states she woke up this morning sweaty with tremors; reports a "panic attack where I couldn't breath." Denies history of the same.  Reports "years of drinking, and 10 years of binge drinking." Thinks she was given too much medication at [facility] and wants further evaluation.  Denies SI/HI or hallucinations. Last drink prior to arrival.

## 2014-06-25 NOTE — ED Notes (Signed)
Dr. Nanavati at bedside 

## 2014-06-25 NOTE — ED Notes (Signed)
Dr.Nanvati aware of pt situation. Pt still requesting medications for anxiety. Informed pt doctor is aware of situation.

## 2014-06-25 NOTE — ED Notes (Addendum)
Recently released from Detox, was not given librium or ativan for home. Today began having alcohol withdrawal and tremors. Drank airplane bottles throughout the day to stop the tremors and anxiety. At this time pt is hypertensive. Speech is clear. She is fidgety and upset. She states, "they sent me home after giving me benzos and librium and then nothing....there was nothing. I have no help. They give you tons of ativan and send you home, the next day you feel okay but then the day after you go through withdrawal and feel awful again and its a vicious cycle" denies headache, denies nausea, alert, oriented, no tremors felt or seen at this time. "This morning I had a very bad reaction while withdrawling. I got sweaty, I couldn't think, I was shaking, I had to cancel a doctor's appointment because I could not stop shaking. The only way I could get through it today was to drink" She denies HX of DTs in past but states, "I have come close. I have to get through this. I have been going through for 3 weeks. Every doctor just gives me drugs, then send me home with no help. With nothing. Not that I need drugs, but please titrate me" CIWA 5 at this time

## 2014-06-25 NOTE — ED Notes (Signed)
Pt assisted back to room by NT; pt crying; husband at bedside

## 2014-06-25 NOTE — Discharge Instructions (Signed)
Return to there ER if your symptoms are getting worse or not responding to ativan. PLEASE READ THE INFORMATION BELOW ON ALCOHOL WITHDRAWAL.   Alcohol Withdrawal Anytime drug use is interfering with normal living activities it has become abuse. This includes problems with family and friends. Psychological dependence has developed when your mind tells you that the drug is needed. This is usually followed by physical dependence when a continuing increase of drugs are required to get the same feeling or "high." This is known as addiction or chemical dependency. A person's risk is much higher if there is a history of chemical dependency in the family. Mild Withdrawal Following Stopping Alcohol, When Addiction or Chemical Dependency Has Developed When a person has developed tolerance to alcohol, any sudden stopping of alcohol can cause uncomfortable physical symptoms. Most of the time these are mild and consist of tremors in the hands and increases in heart rate, breathing, and temperature. Sometimes these symptoms are associated with anxiety, panic attacks, and bad dreams. There may also be stomach upset. Normal sleep patterns are often interrupted with periods of inability to sleep (insomnia). This may last for 6 months. Because of this discomfort, many people choose to continue drinking to get rid of this discomfort and to try to feel normal. Severe Withdrawal with Decreased or No Alcohol Intake, When Addiction or Chemical Dependency Has Developed About five percent of alcoholics will develop signs of severe withdrawal when they stop using alcohol. One sign of this is development of generalized seizures (convulsions). Other signs of this are severe agitation and confusion. This may be associated with believing in things which are not real or seeing things which are not really there (delusions and hallucinations). Vitamin deficiencies are usually present if alcohol intake has been long-term. Treatment for this  most often requires hospitalization and close observation. Addiction can only be helped by stopping use of all chemicals. This is hard but may save your life. With continual alcohol use, possible outcomes are usually loss of self respect and esteem, violence, and death. Addiction cannot be cured but it can be stopped. This often requires outside help and the care of professionals. Treatment centers are listed in the yellow pages under Cocaine, Narcotics, and Alcoholics Anonymous. Most hospitals and clinics can refer you to a specialized care center. It is not necessary for you to go through the uncomfortable symptoms of withdrawal. Your caregiver can provide you with medicines that will help you through this difficult period. Try to avoid situations, friends, or drugs that made it possible for you to keep using alcohol in the past. Learn how to say no. It takes a long period of time to overcome addictions to all drugs, including alcohol. There may be many times when you feel as though you want a drink. After getting rid of the physical addiction and withdrawal, you will have a lessening of the craving which tells you that you need alcohol to feel normal. Call your caregiver if more support is needed. Learn who to talk to in your family and among your friends so that during these periods you can receive outside help. Alcoholics Anonymous (AA) has helped many people over the years. To get further help, contact AA or call your caregiver, counselor, or clergyperson. Al-Anon and Alateen are support groups for friends and family members of an alcoholic. The people who love and care for an alcoholic often need help, too. For information about these organizations, check your phone directory or call a local alcoholism treatment  center.  SEEK IMMEDIATE MEDICAL CARE IF:   You have a seizure.  You have a fever.  You experience uncontrolled vomiting or you vomit up blood. This may be bright red or look like black  coffee grounds.  You have blood in the stool. This may be bright red or appear as a black, tarry, bad-smelling stool.  You become lightheaded or faint. Do not drive if you feel this way. Have someone else drive you or call 409 for help.  You become more agitated or confused.  You develop uncontrolled anxiety.  You begin to see things that are not really there (hallucinate). Your caregiver has determined that you completely understand your medical condition, and that your mental state is back to normal. You understand that you have been treated for alcohol withdrawal, have agreed not to drink any alcohol for a minimum of 1 day, will not operate a car or other machinery for 24 hours, and have had an opportunity to ask any questions about your condition. Document Released: 09/08/2005 Document Revised: 02/21/2012 Document Reviewed: 07/17/2008 [facility] Patient Information 2015 [place], [place]. This information is not intended to replace advice given to you by your health care provider. Make sure you discuss any questions you have with your health care provider.  Alcohol Problems Most adults who drink alcohol drink in moderation (not a lot) are at low risk for developing problems related to their drinking. However, all drinkers, including low-risk drinkers, should know about the health risks connected with drinking alcohol. RECOMMENDATIONS FOR LOW-RISK DRINKING  Drink in moderation. Moderate drinking is defined as follows:   Men - no more than 2 drinks per day.  Nonpregnant women - no more than 1 drink per day.  Over age 9 - no more than 1 drink per day. A standard drink is 12 grams of pure alcohol, which is equal to a 12 ounce bottle of beer or wine cooler, a 5 ounce glass of wine, or 1.5 ounces of distilled spirits (such as whiskey, brandy, vodka, or rum).  ABSTAIN FROM (DO NOT DRINK) ALCOHOL:  When pregnant or considering pregnancy.  When taking a medication that interacts with  alcohol.  If you are alcohol dependent.  A medical condition that prohibits drinking alcohol (such as ulcer, liver disease, or heart disease). DISCUSS WITH YOUR CAREGIVER:  If you are at risk for coronary heart disease, discuss the potential benefits and risks of alcohol use: Light to moderate drinking is associated with lower rates of coronary heart disease in certain populations (for example, men over age 59 and postmenopausal women). Infrequent or nondrinkers are advised not to begin light to moderate drinking to reduce the risk of coronary heart disease so as to avoid creating an alcohol-related problem. Similar protective effects can likely be gained through proper diet and exercise.  Women and the elderly have smaller amounts of body water than men. As a result women and the elderly achieve a higher blood alcohol concentration after drinking the same amount of alcohol.  Exposing a fetus to alcohol can cause a broad range of birth defects referred to as Fetal Alcohol Syndrome (FAS) or Alcohol-Related Birth Defects (ARBD). Although FAS/ARBD is connected with excessive alcohol consumption during pregnancy, studies also have reported neurobehavioral problems in infants born to mothers reporting drinking an average of 1 drink per day during pregnancy.  Heavier drinking (the consumption of more than 4 drinks per occasion by men and more than 3 drinks per occasion by women) impairs learning (cognitive) and psychomotor  functions and increases the risk of alcohol-related problems, including accidents and injuries. CAGE QUESTIONS:   Have you ever felt that you should Cut down on your drinking?  Have people Annoyed you by criticizing your drinking?  Have you ever felt bad or Guilty about your drinking?  Have you ever had a drink first thing in the morning to steady your nerves or get rid of a hangover (Eye opener)? If you answered positively to any of these questions: You may be at risk for  alcohol-related problems if alcohol consumption is:   Men: Greater than 14 drinks per week or more than 4 drinks per occasion.  Women: Greater than 7 drinks per week or more than 3 drinks per occasion. Do you or your family have a medical history of alcohol-related problems, such as:  Blackouts.  Sexual dysfunction.  Depression.  Trauma.  Liver dysfunction.  Sleep disorders.  Hypertension.  Chronic abdominal pain.  Has your drinking ever caused you problems, such as problems with your family, problems with your work (or school) performance, or accidents/injuries?  Do you have a compulsion to drink or a preoccupation with drinking?  Do you have poor control or are you unable to stop drinking once you have started?  Do you have to drink to avoid withdrawal symptoms?  Do you have problems with withdrawal such as tremors, nausea, sweats, or mood disturbances?  Does it take more alcohol than in the past to get you high?  Do you feel a strong urge to drink?  Do you change your plans so that you can have a drink?  Do you ever drink in the morning to relieve the shakes or a hangover? If you have answered a number of the previous questions positively, it may be time for you to talk to your caregivers, family, and friends and see if they think you have a problem. Alcoholism is a chemical dependency that keeps getting worse and will eventually destroy your health and relationships. Many alcoholics end up dead, impoverished, or in prison. This is often the end result of all chemical dependency.  Do not be discouraged if you are not ready to take action immediately.  Decisions to change behavior often involve up and down desires to change and feeling like you cannot decide.  Try to think more seriously about your drinking behavior.  Think of the reasons to quit. WHERE TO GO FOR ADDITIONAL INFORMATION   The [place] on Alcohol Abuse and Alcoholism  (NIAAA) http://www.bradshaw.com/  [employer] on Alcoholism and Drug Dependence (NCADD) www.ncadd.org  American Society of Addiction Medicine (ASAM) http://carpenter.net/  Document Released: 11/29/2005 Document Revised: 02/21/2012 Document Reviewed: 07/17/2008 [facility] Patient Information 2015 [place]. This information is not intended to replace advice given to you by your health care provider. Make sure you discuss any questions you have with your health care provider.   Emergency Department Resource Guide 1) Find a Doctor and Pay Out of Pocket Although you won't have to find out who is covered by your insurance plan, it is a good idea to ask around and get recommendations. You will then need to call the office and see if the doctor you have chosen will accept you as a new patient and what types of options they offer for patients who are self-pay. Some doctors offer discounts or will set up payment plans for their patients who do not have insurance, but you will need to ask so you aren't surprised when you get to your appointment.  2) Contact Your Local Health Department Not all health departments have doctors that can see patients for sick visits, but many do, so it is worth a call to see if yours does. If you don't know where your local health department is, you can check in your phone book. The CDC also has a tool to help you locate your state's health department, and many state websites also have listings of all of their local health departments.  3) Find a [facility] If your illness is not likely to be very severe or complicated, you may want to try a walk in clinic. These are popping up all over the country in pharmacies, drugstores, and shopping centers. They're usually staffed by nurse practitioners or physician assistants that have been trained to treat common illnesses and complaints. They're usually fairly quick and inexpensive. However, if you have serious medical issues or chronic  medical problems, these are probably not your best option.  No Primary Care Doctor: - Call Health Connect at  [phone] - they can help you locate a primary care doctor that  accepts your insurance, provides certain services, etc. - Physician Referral Service- [phone]  Chronic Pain Problems: Organization         Address  Phone   Notes  [facility]  [phone] Patients need to be referred by their primary care doctor.   Medication Assistance: Organization         Address  Phone   Notes  [facility] Medication [facility] [address] [place], [place] [address] [phone] --Must be a resident of [facility] -- Must have NO insurance coverage whatsoever (no Medicaid/ Medicare, etc.) -- The pt. MUST have a primary care doctor that directs their care regularly and follows them in the community   MedAssist  [phone]   [employer]  [phone]    Agencies that provide inexpensive medical care: Organization         Address  Phone   Notes  [place]  [phone]   [facility]    [phone]   [facility] [place], [place] [address] [phone]   [place] [address], [place] [phone]   Planned Parenthood    [phone]   [facility]    [phone]   [place] and [address], [place] Phone:  [phone], Fax:  [phone] Hours of Operation:  9 am - 6 pm, M-F.  Also accepts Medicaid/Medicare and self-pay.  [facility] for [place], [address], [place] Phone: [phone], Fax: [phone]. Hours of Operation:  8:30 am - 5:30 pm, M-F.  Also accepts Medicaid and self-pay.  [facility] [place] [address], [place] Phone: [phone]   [place], [place], [place] [phone],  Ext. [phone] Mondays & Thursdays: 7-9 AM.  First 15 patients are seen on a first come, first serve basis.    [place] Providers:  Organization         Address  Phone   Notes  [facility] [address], [place] [phone] Also accepts self-pay patients.  [place], [place]  [phone]   [place], Suite  [place] [phone]   [place] [address] [place] [phone]   Lucianne Lei [address], [place]   [phone] Only accepts [place] Access [place] patients after they have their name applied to their card.   Self-Pay (no insurance) in [facility]:  Organization         Address  Phone   Notes  Sickle Cell Patients, [facility] [place] [phone]   [facility] [place] [phone]   [facility] [place]  [place], [place], [place] [phone]   [facility]/Dr. Osei-Bonsu  [address], [place] [address], [place] [phone] Phone number for both [place] and [place] locations is the same.  Urgent Medical and [facility] [address] [place] [phone]   [facility] [address] [place] or [address] [phone]   [facility] [address] [place] [phone], phone; [phone], fax Sees patients 1st and 3rd Saturday of every month.  Must not qualify for public or private insurance (i.e. Medicaid, Medicare, [place] Health Choice, Veterans' Benefits)  Household income should be no more than 200% of the poverty level The clinic cannot treat you if you are pregnant or think you are pregnant  Sexually transmitted diseases are not  treated at the clinic.    Dental Care: Organization         Address  Phone  Notes  [facility] Department of [place] [place] [phone] Accepts children up to age 39 who are enrolled in [place] or [place]; pregnant women with a Medicaid card; and children who have applied for Medicaid or [place] Health Choice, but were declined, whose parents can pay a reduced fee at time of service.  [facility] Department of [facility]  [address], [place] [phone] Accepts children up to age 32 who are enrolled in [place] or [place]; pregnant women with a Medicaid card; and children who have applied for Medicaid or [place] Health Choice, but were declined, whose parents can pay a reduced fee at time of service.  [place] Adult Dental Access PROGRAM  [place] [phone] Patients are seen by appointment only. Walk-ins are not accepted. [place] will see patients 77 years of age and older. Monday - Tuesday (8am-5pm) Most Wednesdays (8:30-5pm) $30 per visit, cash only  [facility] Access PROGRAM  [address], [facility] [phone] Patients are seen by appointment only. Walk-ins are not accepted. [place] will see patients 110 years of age and older. One Wednesday Evening (Monthly: Volunteer Based).  $30 per visit, cash only  [place]  [phone] for adults; Children under age 54, call Graduate Pediatric Dentistry at [phone]. Children aged 55-14, please call [phone] to request a pediatric application.  Dental services are provided in all areas of dental care including fillings, crowns and bridges, complete and partial dentures, implants, gum treatment, root canals, and extractions. Preventive care is also provided. Treatment is provided to both adults and children. Patients are selected via a lottery and there is  often a waiting list.   [facility] [address],  [place]  [phone] [email or web address]   [facility] [address] [place], [place] [phone], Ext. [phone] Second and Fourth Thursday of each month, opens at 6:30 AM; Clinic ends at 9 AM.  Patients are seen on a first-come first-served basis, and a limited number are seen during each clinic.   [facility]  [address] Hillard Danker [place], [place] [phone]   Eligibility Requirements You must have lived in [place], [place], or [place] counties for at least the last three months.   You cannot be eligible for state or federal sponsored [employer], including [employer], [place], or [employer].   You generally cannot be eligible for healthcare insurance through your employer.    How to apply: Eligibility screenings are held every Tuesday and Wednesday afternoon from 1:00 pm until 4:00 pm. You do not need an appointment for the interview!  [facility] [address] [place], [place]   [place]  [place] Department  [place]  [phone]    Behavioral Health Resources in the Community: Intensive Outpatient Programs Organization         Address  Phone  Notes  [place]. [address], [place], [place] [phone]   [facility] Outpatient [address] [place], [place]   ADS: Alcohol & Drug Svcs [address] [place], [place]   [place] [address]  [place], [place] or [phone]   Substance Abuse Resources Organization         Address  Phone  Notes  Alcohol and Drug Services  [phone]   [place]  [phone]   [facility] [place]   Chinita Pester  [phone]   Residential & Outpatient Substance  Abuse Program  [phone]   Psychological Services Organization         Address  Phone  Notes  [facility]  [place]  [phone]   [place] [address] [place] or [phone]    Mobile Crisis Teams Organization         Address  Phone  Notes  [facility], Mobile Crisis Care Unit  [phone]   [facility]  [address] [place], [place]   [place] [address] [place]ta Schuyler [phone]    Self-Help/Support Groups Organization         Address  Phone             Notes  [place]. of [facility] - variety of support groups  [place] Call for more information  Narcotics Anonymous (NA), [facility] [address], [employer] [place]  2 meetings at this location   Special educational needs teacher         Address  Phone  Notes  ASAP Residential Treatment [place],    [place]  [phone]   [facility]  [address] [place] [phone], [place], [place]   [place], [place] [phone] Admissions: 8am-3pm M-F  Incentives Substance [place] [address]    [place], [place] [phone]   [facility] [address] [place], [place], [place]   [facility] [address],  [place], [place]   Insight  Programs - Intensive Outpatient [address] Kristeen [address], [place], [place]   [facility] ([place].,  [place], [place] [phone] or [phone]   [facility]idential Treatment Services (RTS) [address] [place], [place] Accepts Medicaid  [facility] [place] [address]  [place] [phone] Substance Abuse/Addiction Treatment   [facility] The Organization          Address  Phone  Notes  [facility]  [phone]   Domenic Schwab, PhD [address] Arlis Porta [place], [place]   [phone] or [phone]   [place], [place] [phone]   [address], [place], [place] [phone] Insurance/Medicaid/sponsorship through [facility] and Families [address] [place]                                    [place], [place] [phone] [place] [address], [place] [phone]    Dr. Adele Schilder  [phone]   [facility] [place] Dept. 1) [address] [place] 2) [place] 3)  [place] [address], [place] [phone]  [phone]   [place] [phone] or [phone] (After Hours)

## 2014-06-27 NOTE — Telephone Encounter (Signed)
Pt LVM requesting med refill. Please f/u with pt.

## 2014-07-08 ENCOUNTER — Ambulatory Visit: Payer: Self-pay | Admitting: Internal Medicine

## 2014-07-09 ENCOUNTER — Ambulatory Visit (INDEPENDENT_AMBULATORY_CARE_PROVIDER_SITE_OTHER): Payer: 59 | Admitting: Internal Medicine

## 2014-07-09 ENCOUNTER — Encounter: Payer: Self-pay | Admitting: Internal Medicine

## 2014-07-09 ENCOUNTER — Other Ambulatory Visit: Payer: Self-pay | Admitting: *Deleted

## 2014-07-09 VITALS — BP 130/64 | HR 87 | Temp 98.0°F | Resp 12 | Ht 63.25 in | Wt 168.0 lb

## 2014-07-09 DIAGNOSIS — E119 Type 2 diabetes mellitus without complications: Secondary | ICD-10-CM

## 2014-07-09 MED ORDER — SITAGLIPTIN PHOSPHATE 100 MG PO TABS: 100.0000 mg | ORAL_TABLET | Freq: Every day | ORAL | Status: DC

## 2014-07-09 MED ORDER — LISINOPRIL 5 MG PO TABS: 5.0000 mg | ORAL_TABLET | Freq: Every day | ORAL | Status: DC

## 2014-07-09 MED ORDER — NEBIVOLOL HCL 5 MG PO TABS: 5.0000 mg | ORAL_TABLET | Freq: Every day | ORAL | Status: DC

## 2014-07-09 MED ORDER — EZETIMIBE 10 MG PO TABS: 10.0000 mg | ORAL_TABLET | Freq: Every day | ORAL | Status: DC

## 2014-07-09 NOTE — Progress Notes (Signed)
Patient ID: [name], female   DOB: 03-Nov-1946, 68 y.o.   MRN: [id number]  HPI: [name] is a 68 y.o.-year-old female, referred by her PCP, Dr. Charlies Silvers, for management of DM2, non-insulin-dependent, uncontrolled, without complications.  Patient has been diagnosed with diabetes in 2009; she was on insulin at dx >> she was then seen at [facility] >> she was changed after 6 mo to Byetta. Last hemoglobin A1c was: Lab Results  Component Value Date   HGBA1C 11.0* 04/03/2014   HGBA1C 7.1* 05/29/2012   HGBA1C 6.6* 03/13/2012  Before the A1c returned at 11%, she was off all medicines. She was seen regularly at [facility], but missed her appt before b/c drinking.  Pt is on a regimen of: - Januvia 25 >> 50 mg daily in am taking this every day Tried insulin. Tried Byetta >> had low CBGs (" I bottomed out" - sugars 70).  She tried Januvia 100 mg. She tried Metformin >> nausea and diarrhea (has colitis).  Pt checks her sugars once a day in am and they are: - am: 110-120 - 2h after b'fast: n/c - before lunch: n/c - 2h after lunch: n/c - before dinner: n/c - 2h after dinner: 201 - bedtime: n/c - nighttime: n/c No lows. Lowest sugar was 100; she has hypoglycemia awareness at 100.  Highest sugar was 201.  She tells me she is an alcoholic and drank more in the last 9 months.  Pt's meals are: - Breakfast: cereal + banana + 2%milk; bagel with cream cheese - splits this with her parrot - Lunch:might skip; leftovers - baked potato + steak - Dinner: meat + starch + salad - Snacks: no  She drinks diet ginger ale ("all day")  - no CKD, last BUN/creatinine:  Lab Results  Component Value Date   BUN 16 06/25/2014   CREATININE 0.57 06/25/2014  On Lisinopril. - last set of lipids: Lab Results  Component Value Date   CHOL 202* 04/18/2014   HDL 50 04/18/2014   LDLCALC 135* 04/18/2014   LDLDIRECT 186.5 11/17/2006   TRIG 85 04/18/2014   CHOLHDL 4.0 04/18/2014  On Zetia. 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  - Last  eye exam was 1 year ago. No DR.  - + numbness and tingling in her L foot, but from L leg fx  Pt has no FH of DM.  ROS: Constitutional: + weight gain, no fatigue, + subjective hyperthermia/hypothermia, + poor sleep Eyes: + blurry vision, no xerophthalmia ENT: no sore throat, no nodules palpated in throat, no dysphagia/odynophagia, no hoarseness; + decreased hearing, + ringing in years Cardiovascular: no CP/SOB/palpitations/leg swelling Respiratory: no cough/SOB Gastrointestinal: no N/V/+ D/no C Musculoskeletal: + muscle aches/no joint aches Skin: no rashes Neurological: no tremors/numbness/tingling/dizziness Psychiatric: no depression/anxiety  Past Medical History  Diagnosis Date  . Alcohol abuse   . Diabetes mellitus   . Hypertension   . Fibromyalgia   . Depression   . Carotid artery stenosis    Past Surgical History  Procedure Laterality Date  . Cesarean section    . Tonsillectomy    . Bunionectomy     History   Social History  . Marital Status: separated    Spouse Name: N/A    Number of Children: 2   Occupational History  . Retired Therapist, sports   Social History Main Topics  . Smoking status: Former Smoker    Quit date: 03/11/2010  . Smokeless tobacco: Never Used  . Alcohol Use: Yes     Comment:  4-5 bottles of wine per day  . Drug Use: No     Comment: Pt denies    Current Outpatient Prescriptions on File Prior to Visit  Medication Sig Dispense Refill  . b complex vitamins tablet Take 1 tablet by mouth daily.      . diphenoxylate-atropine (LOMOTIL) 2.5-0.025 MG per tablet Take 1 tablet by mouth 4 (four) times daily as needed for diarrhea or loose stools.      Marland Kitchen escitalopram (LEXAPRO) 20 MG tablet Take 20 mg by mouth daily.      Marland Kitchen ezetimibe (ZETIA) 10 MG tablet Take 10 mg by mouth daily.      Marland Kitchen lisinopril (PRINIVIL,ZESTRIL) 5 MG tablet Take 5 mg by mouth daily.      Marland Kitchen LORazepam (ATIVAN) 1 MG tablet Take 1 mg by mouth every 8 (eight) hours as needed for anxiety.      .  nebivolol (BYSTOLIC) 5 MG tablet Take 5 mg by mouth daily.      . sitaGLIPtin (JANUVIA) 25 MG tablet Take 50 mg by mouth daily.       Marland Kitchen LORazepam (ATIVAN) 1 MG tablet Take 1 tablet (1 mg total) by mouth every 6 (six) hours as needed for anxiety.  15 tablet  0   No current facility-administered medications on file prior to visit.   Allergies  Allergen Reactions  . Other Anaphylaxis    shellfish  . Shellfish Allergy Anaphylaxis  . Latex Other (See Comments)    Break outs  . Lipitor [Atorvastatin Calcium] Other (See Comments)    Muscle aches  . Codeine Rash    And nausea   . Pentazocine Lactate Rash  . Sulfonamide Derivatives Rash   Family History  Problem Relation Age of Onset  . Cancer Mother   . Heart disease Father   . Heart disease Sister    PE: BP 130/64  Pulse 87  Temp(Src) 98 F (36.7 C) (Oral)  Resp 12  Ht 5' 3.25" (1.607 m)  Wt 168 lb (76.204 kg)  BMI 29.51 kg/m2  SpO2 95% Wt Readings from Last 3 Encounters:  07/09/14 168 lb (76.204 kg)  06/23/14 157 lb (71.215 kg)  06/09/14 158 lb 8 oz (71.895 kg)   Constitutional: overweight, in NAD Eyes: PERRLA, EOMI, no exophthalmos ENT: moist mucous membranes, no thyromegaly, no cervical lymphadenopathy Cardiovascular: RRR, No MRG Respiratory: CTA B Gastrointestinal: abdomen soft, NT, ND, BS+ Musculoskeletal: no deformities, strength intact in all 4 Skin: moist, warm, no rashes Neurological: + tremors with outstretched hands, DTR normal in all 4  ASSESSMENT: 1. DM2, non-insulin-dependent, uncontrolled, without complications  PLAN:  1. Patient with long-standing, recently more uncontrolled diabetes, on oral antidiabetic regimen, which became insufficient - We discussed about options for treatment, and I suggested to:  Patient Instructions  Please increase Januvia to 100 mg in am. Please return in 1 month with your sugar log.  Check sugars 1-2x a day and write them down. Please stop downstairs at [facility] lab to  get labs drawn. - Strongly advised her to start checking sugars at different times of the day - check 1-2 times a day, rotating checks - given sugar log and advised how to fill it and to bring it at next appt  - given foot care handout and explained the principles  - given instructions for hypoglycemia management "15-15 rule"  - advised for yearly eye exams -  She is up to date - will check HbA1c now - refilled her meds per her  request - no PCP >> advised to get one - she is trying to wean her alcohol down - Return to clinic in 1 mo with sugar log   Lab Results  Component Value Date   HGBA1C 7.1* 07/14/2014   Excellent improvement in HbA1c!

## 2014-07-09 NOTE — Patient Instructions (Addendum)
Please increase Januvia to 100 mg in am. Please return in 1 month with your sugar log.  Check sugars 1-2x a day and write them down. Please stop downstairs at [facility] lab to get labs drawn.  PATIENT INSTRUCTIONS FOR TYPE 2 DIABETES:  **Please join MyChart!** - see attached instructions about how to join if you have not done so already.  DIET AND EXERCISE Diet and exercise is an important part of diabetic treatment.  We recommended aerobic exercise in the form of brisk walking (working between 40-60% of maximal aerobic capacity, similar to brisk walking) for 150 minutes per week (such as 30 minutes five days per week) along with 3 times per week performing 'resistance' training (using various gauge rubber tubes with handles) 5-10 exercises involving the major muscle groups (upper body, lower body and core) performing 10-15 repetitions (or near fatigue) each exercise. Start at half the above goal but build slowly to reach the above goals. If limited by weight, joint pain, or disability, we recommend daily walking in a swimming pool with water up to waist to reduce pressure from joints while allow for adequate exercise.    BLOOD GLUCOSES Monitoring your blood glucoses is important for continued management of your diabetes. Please check your blood glucoses 2-4 times a day: fasting, before meals and at bedtime (you can rotate these measurements - e.g. one day check before the 3 meals, the next day check before 2 of the meals and before bedtime, etc.).   HYPOGLYCEMIA (low blood sugar) Hypoglycemia is usually a reaction to not eating, exercising, or taking too much insulin/ other diabetes drugs.  Symptoms include tremors, sweating, hunger, confusion, headache, etc. Treat IMMEDIATELY with 15 grams of Carbs:   4 glucose tablets    cup regular juice/soda   2 tablespoons raisins   4 teaspoons sugar   1 tablespoon honey Recheck blood glucose in 15 mins and repeat above if still symptomatic/blood  glucose <100.  RECOMMENDATIONS TO REDUCE YOUR RISK OF DIABETIC COMPLICATIONS: * Take your prescribed MEDICATION(S) * Follow a DIABETIC diet: Complex carbs, fiber rich foods, (monounsaturated and polyunsaturated) fats * AVOID saturated/trans fats, high fat foods, >2,300 mg salt per day. * EXERCISE at least 5 times a week for 30 minutes or preferably daily.  * DO NOT SMOKE OR DRINK more than 1 drink a day. * Check your FEET every day. Do not wear tightfitting shoes. Contact us if you develop an ulcer * See your EYE doctor once a year or more if needed * Get a FLU shot once a year * Get a PNEUMONIA vaccine once before and once after age 78 years  GOALS:  * Your Hemoglobin A1c of <7%  * fasting sugars need to be <130 * after meals sugars need to be <180 (2h after you start eating) * Your Systolic BP should be 268 or lower  * Your Diastolic BP should be 80 or lower  * Your HDL (Good Cholesterol) should be 40 or higher  * Your LDL (Bad Cholesterol) should be 100 or lower. * Your Triglycerides should be 150 or lower  * Your Urine microalbumin (kidney function) should be <30 * Your Body Mass Index should be 25 or lower   We will be glad to help you achieve these goals. Our telephone number is: [phone].

## 2014-07-14 ENCOUNTER — Encounter (HOSPITAL_COMMUNITY): Payer: Self-pay | Admitting: Emergency Medicine

## 2014-07-14 ENCOUNTER — Inpatient Hospital Stay (HOSPITAL_COMMUNITY)
Admission: EM | Admit: 2014-07-14 | Discharge: 2014-07-17 | DRG: 897 | Disposition: A | Discharge status: Home / Self Care | Payer: 59 | Attending: Internal Medicine | Admitting: Internal Medicine

## 2014-07-14 ENCOUNTER — Emergency Department (HOSPITAL_COMMUNITY): Payer: 59

## 2014-07-14 DIAGNOSIS — E872 Acidosis, unspecified: Secondary | ICD-10-CM | POA: Diagnosis present

## 2014-07-14 DIAGNOSIS — I10 Essential (primary) hypertension: Secondary | ICD-10-CM

## 2014-07-14 DIAGNOSIS — F329 Major depressive disorder, single episode, unspecified: Secondary | ICD-10-CM | POA: Diagnosis present

## 2014-07-14 DIAGNOSIS — N179 Acute kidney failure, unspecified: Secondary | ICD-10-CM | POA: Diagnosis present

## 2014-07-14 DIAGNOSIS — F3289 Other specified depressive episodes: Secondary | ICD-10-CM | POA: Diagnosis present

## 2014-07-14 DIAGNOSIS — F10931 Alcohol use, unspecified with withdrawal delirium: Secondary | ICD-10-CM | POA: Diagnosis present

## 2014-07-14 DIAGNOSIS — F1020 Alcohol dependence, uncomplicated: Secondary | ICD-10-CM | POA: Diagnosis present

## 2014-07-14 DIAGNOSIS — I959 Hypotension, unspecified: Secondary | ICD-10-CM | POA: Diagnosis present

## 2014-07-14 DIAGNOSIS — E871 Hypo-osmolality and hyponatremia: Secondary | ICD-10-CM

## 2014-07-14 DIAGNOSIS — F10932 Alcohol use, unspecified with withdrawal with perceptual disturbance: Secondary | ICD-10-CM

## 2014-07-14 DIAGNOSIS — F10939 Alcohol use, unspecified with withdrawal, unspecified: Secondary | ICD-10-CM | POA: Diagnosis present

## 2014-07-14 DIAGNOSIS — E861 Hypovolemia: Secondary | ICD-10-CM | POA: Diagnosis present

## 2014-07-14 DIAGNOSIS — E785 Hyperlipidemia, unspecified: Secondary | ICD-10-CM

## 2014-07-14 DIAGNOSIS — E119 Type 2 diabetes mellitus without complications: Secondary | ICD-10-CM | POA: Diagnosis present

## 2014-07-14 DIAGNOSIS — F10231 Alcohol dependence with withdrawal delirium: Secondary | ICD-10-CM | POA: Diagnosis present

## 2014-07-14 DIAGNOSIS — F10930 Alcohol use, unspecified with withdrawal, uncomplicated: Secondary | ICD-10-CM

## 2014-07-14 DIAGNOSIS — F10239 Alcohol dependence with withdrawal, unspecified: Secondary | ICD-10-CM | POA: Diagnosis present

## 2014-07-14 DIAGNOSIS — I6529 Occlusion and stenosis of unspecified carotid artery: Secondary | ICD-10-CM | POA: Diagnosis present

## 2014-07-14 DIAGNOSIS — F1999 Other psychoactive substance use, unspecified with unspecified psychoactive substance-induced disorder: Secondary | ICD-10-CM | POA: Diagnosis present

## 2014-07-14 DIAGNOSIS — IMO0001 Reserved for inherently not codable concepts without codable children: Secondary | ICD-10-CM | POA: Diagnosis present

## 2014-07-14 DIAGNOSIS — F10229 Alcohol dependence with intoxication, unspecified: Secondary | ICD-10-CM | POA: Diagnosis present

## 2014-07-14 DIAGNOSIS — E878 Other disorders of electrolyte and fluid balance, not elsewhere classified: Secondary | ICD-10-CM | POA: Diagnosis present

## 2014-07-14 DIAGNOSIS — F411 Generalized anxiety disorder: Secondary | ICD-10-CM

## 2014-07-14 DIAGNOSIS — Z87891 Personal history of nicotine dependence: Secondary | ICD-10-CM

## 2014-07-14 DIAGNOSIS — F10230 Alcohol dependence with withdrawal, uncomplicated: Secondary | ICD-10-CM

## 2014-07-14 DIAGNOSIS — R112 Nausea with vomiting, unspecified: Secondary | ICD-10-CM | POA: Diagnosis present

## 2014-07-14 DIAGNOSIS — F1010 Alcohol abuse, uncomplicated: Secondary | ICD-10-CM

## 2014-07-14 DIAGNOSIS — F10232 Alcohol dependence with withdrawal with perceptual disturbance: Secondary | ICD-10-CM

## 2014-07-14 DIAGNOSIS — Z609 Problem related to social environment, unspecified: Secondary | ICD-10-CM | POA: Diagnosis not present

## 2014-07-14 LAB — SALICYLATE LEVEL: Salicylate Lvl: <2 mg/dL — ABNORMAL LOW (ref 2.8–20.0)

## 2014-07-14 LAB — COMPREHENSIVE METABOLIC PANEL
ALT: 20 U/L (ref 0–35)
AST: 37 U/L (ref 0–37)
Albumin: 4.3 g/dL (ref 3.5–5.2)
Alkaline Phosphatase: 92 U/L (ref 39–117)
Anion gap: 30 — ABNORMAL HIGH (ref 5–15)
BUN: 19 mg/dL (ref 6–23)
CO2: 16 mEq/L — ABNORMAL LOW (ref 19–32)
Calcium: 8.5 mg/dL (ref 8.4–10.5)
Chloride: 90 mEq/L — ABNORMAL LOW (ref 96–112)
Creatinine, Ser: 1.32 mg/dL — ABNORMAL HIGH (ref 0.50–1.10)
GFR calc Af Amer: 48 mL/min — ABNORMAL LOW (ref >90)
GFR calc non Af Amer: 41 mL/min — ABNORMAL LOW (ref >90)
Glucose, Bld: 118 mg/dL — ABNORMAL HIGH (ref 70–99)
Potassium: 4.6 mEq/L (ref 3.7–5.3)
Sodium: 136 mEq/L — ABNORMAL LOW (ref 137–147)
Total Bilirubin: 0.7 mg/dL (ref 0.3–1.2)
Total Protein: 7.8 g/dL (ref 6.0–8.3)

## 2014-07-14 LAB — CBC
HCT: 38.8 % (ref 36.0–46.0)
Hemoglobin: 12.9 g/dL (ref 12.0–15.0)
MCH: 32.3 pg (ref 26.0–34.0)
MCHC: 33.2 g/dL (ref 30.0–36.0)
MCV: 97.2 fL (ref 78.0–100.0)
Platelets: 216 10*3/uL (ref 150–400)
RBC: 3.99 MIL/uL (ref 3.87–5.11)
RDW: 18.3 % — ABNORMAL HIGH (ref 11.5–15.5)
WBC: 6.2 10*3/uL (ref 4.0–10.5)

## 2014-07-14 LAB — RAPID URINE DRUG SCREEN, HOSP PERFORMED
Amphetamines: NOT DETECTED
Barbiturates: NOT DETECTED
Benzodiazepines: NOT DETECTED
Cocaine: NOT DETECTED
Opiates: NOT DETECTED
Tetrahydrocannabinol: NOT DETECTED

## 2014-07-14 LAB — ETHANOL: Alcohol, Ethyl (B): 215 mg/dL — ABNORMAL HIGH (ref 0–11)

## 2014-07-14 LAB — MAGNESIUM: Magnesium: 2.4 mg/dL (ref 1.5–2.5)

## 2014-07-14 LAB — MRSA PCR SCREENING: MRSA by PCR: NEGATIVE

## 2014-07-14 LAB — PHOSPHORUS: Phosphorus: 4.8 mg/dL — ABNORMAL HIGH (ref 2.3–4.6)

## 2014-07-14 LAB — ACETAMINOPHEN LEVEL: Acetaminophen (Tylenol), Serum: <15 ug/mL (ref 10–30)

## 2014-07-14 LAB — GLUCOSE, CAPILLARY: Glucose-Capillary: 106 mg/dL — ABNORMAL HIGH (ref 70–99)

## 2014-07-14 MED ORDER — ESCITALOPRAM OXALATE 20 MG PO TABS
20.0000 mg | ORAL_TABLET | Freq: Every day | ORAL | Status: DC
  Administered 2014-07-15 – 2014-07-17 (×3): 20 mg via ORAL
  Filled 2014-07-14 (×3): qty 1

## 2014-07-14 MED ORDER — THIAMINE HCL 100 MG/ML IJ SOLN
Freq: Once | INTRAVENOUS | Status: AC
  Administered 2014-07-14: 19:00:00 via INTRAVENOUS
  Filled 2014-07-14: qty 1000

## 2014-07-14 MED ORDER — CHLORDIAZEPOXIDE HCL 25 MG PO CAPS
25.0000 mg | ORAL_CAPSULE | ORAL | Status: AC
  Administered 2014-07-16 – 2014-07-17 (×2): 25 mg via ORAL
  Filled 2014-07-14 (×2): qty 1

## 2014-07-14 MED ORDER — EZETIMIBE 10 MG PO TABS
10.0000 mg | ORAL_TABLET | Freq: Every day | ORAL | Status: DC
  Administered 2014-07-15 – 2014-07-17 (×3): 10 mg via ORAL
  Filled 2014-07-14 (×3): qty 1

## 2014-07-14 MED ORDER — CHLORDIAZEPOXIDE HCL 25 MG PO CAPS: 25.0000 mg | ORAL_CAPSULE | Freq: Every day | ORAL | Status: DC

## 2014-07-14 MED ORDER — LOPERAMIDE HCL 2 MG PO CAPS
2.0000 mg | ORAL_CAPSULE | ORAL | Status: DC | PRN
  Administered 2014-07-15: 2 mg via ORAL
  Administered 2014-07-16: 4 mg via ORAL
  Filled 2014-07-14: qty 2

## 2014-07-14 MED ORDER — CHLORDIAZEPOXIDE HCL 25 MG PO CAPS
25.0000 mg | ORAL_CAPSULE | Freq: Four times a day (QID) | ORAL | Status: AC
  Administered 2014-07-14 – 2014-07-15 (×4): 25 mg via ORAL
  Filled 2014-07-14 (×3): qty 1

## 2014-07-14 MED ORDER — THIAMINE HCL 100 MG/ML IJ SOLN
100.0000 mg | Freq: Every day | INTRAMUSCULAR | Status: DC
  Filled 2014-07-14: qty 1

## 2014-07-14 MED ORDER — THIAMINE HCL 100 MG/ML IJ SOLN: 100.0000 mg | Freq: Once | INTRAMUSCULAR | Status: DC

## 2014-07-14 MED ORDER — CHLORDIAZEPOXIDE HCL 25 MG PO CAPS
25.0000 mg | ORAL_CAPSULE | Freq: Three times a day (TID) | ORAL | Status: AC
  Administered 2014-07-15 – 2014-07-16 (×3): 25 mg via ORAL
  Filled 2014-07-14 (×3): qty 1

## 2014-07-14 MED ORDER — ADULT MULTIVITAMIN W/MINERALS CH: 1.0000 | ORAL_TABLET | Freq: Every day | ORAL | Status: DC

## 2014-07-14 MED ORDER — ACETAMINOPHEN 325 MG PO TABS: 650.0000 mg | ORAL_TABLET | Freq: Four times a day (QID) | ORAL | Status: DC | PRN

## 2014-07-14 MED ORDER — ADULT MULTIVITAMIN W/MINERALS CH
1.0000 | ORAL_TABLET | Freq: Every day | ORAL | Status: DC
  Administered 2014-07-15 – 2014-07-17 (×3): 1 via ORAL
  Filled 2014-07-14 (×3): qty 1

## 2014-07-14 MED ORDER — HYDROXYZINE HCL 25 MG PO TABS
25.0000 mg | ORAL_TABLET | Freq: Four times a day (QID) | ORAL | Status: DC | PRN
  Administered 2014-07-14: 25 mg via ORAL
  Filled 2014-07-14: qty 1

## 2014-07-14 MED ORDER — ONDANSETRON 4 MG PO TBDP
4.0000 mg | ORAL_TABLET | Freq: Four times a day (QID) | ORAL | Status: DC | PRN
  Administered 2014-07-16: 4 mg via ORAL
  Filled 2014-07-14: qty 1

## 2014-07-14 MED ORDER — CHLORDIAZEPOXIDE HCL 25 MG PO CAPS
50.0000 mg | ORAL_CAPSULE | Freq: Once | ORAL | Status: AC
  Administered 2014-07-14: 50 mg via ORAL
  Filled 2014-07-14: qty 2

## 2014-07-14 MED ORDER — ACETAMINOPHEN 650 MG RE SUPP: 650.0000 mg | Freq: Four times a day (QID) | RECTAL | Status: DC | PRN

## 2014-07-14 MED ORDER — SODIUM CHLORIDE 0.9 % IV BOLUS (SEPSIS)
2000.0000 mL | Freq: Once | INTRAVENOUS | Status: AC
  Administered 2014-07-14: 2000 mL via INTRAVENOUS

## 2014-07-14 MED ORDER — SODIUM CHLORIDE 0.9 % IV BOLUS (SEPSIS)
1000.0000 mL | Freq: Once | INTRAVENOUS | Status: AC
  Administered 2014-07-14: 1000 mL via INTRAVENOUS

## 2014-07-14 MED ORDER — ONDANSETRON HCL 4 MG PO TABS: 4.0000 mg | ORAL_TABLET | Freq: Four times a day (QID) | ORAL | Status: DC | PRN

## 2014-07-14 MED ORDER — INSULIN ASPART 100 UNIT/ML ~~LOC~~ SOLN: 0.0000 [IU] | SUBCUTANEOUS | Status: DC

## 2014-07-14 MED ORDER — SODIUM CHLORIDE 0.9 % IV SOLN
INTRAVENOUS | Status: AC
  Administered 2014-07-14: 19:00:00 via INTRAVENOUS

## 2014-07-14 MED ORDER — ENOXAPARIN SODIUM 40 MG/0.4ML ~~LOC~~ SOLN
40.0000 mg | SUBCUTANEOUS | Status: DC
  Administered 2014-07-14 – 2014-07-16 (×3): 40 mg via SUBCUTANEOUS
  Filled 2014-07-14 (×4): qty 0.4

## 2014-07-14 MED ORDER — CHLORDIAZEPOXIDE HCL 25 MG PO CAPS
25.0000 mg | ORAL_CAPSULE | Freq: Four times a day (QID) | ORAL | Status: DC | PRN
  Administered 2014-07-16: 25 mg via ORAL
  Filled 2014-07-14: qty 5
  Filled 2014-07-14: qty 1

## 2014-07-14 MED ORDER — FOLIC ACID 1 MG PO TABS
1.0000 mg | ORAL_TABLET | Freq: Every day | ORAL | Status: DC
  Administered 2014-07-15 – 2014-07-17 (×3): 1 mg via ORAL
  Filled 2014-07-14 (×3): qty 1

## 2014-07-14 MED ORDER — VITAMIN B-1 100 MG PO TABS
100.0000 mg | ORAL_TABLET | Freq: Every day | ORAL | Status: DC
  Administered 2014-07-15 – 2014-07-17 (×3): 100 mg via ORAL
  Filled 2014-07-14 (×3): qty 1

## 2014-07-14 MED ORDER — ONDANSETRON HCL 4 MG/2ML IJ SOLN
4.0000 mg | Freq: Four times a day (QID) | INTRAMUSCULAR | Status: DC | PRN
  Administered 2014-07-14: 4 mg via INTRAVENOUS
  Filled 2014-07-14 (×2): qty 2

## 2014-07-14 MED ORDER — ONDANSETRON 4 MG PO TBDP
8.0000 mg | ORAL_TABLET | Freq: Once | ORAL | Status: AC
  Administered 2014-07-14: 8 mg via ORAL
  Filled 2014-07-14: qty 2

## 2014-07-14 MED ORDER — FOLIC ACID 5 MG/ML IJ SOLN: 1.0000 mg | Freq: Every day | INTRAMUSCULAR | Status: DC

## 2014-07-14 NOTE — ED Notes (Signed)
Security at the bedside to wand patient.

## 2014-07-14 NOTE — ED Notes (Signed)
MD at the bedside  

## 2014-07-14 NOTE — ED Notes (Signed)
Pt reports here for detox from alcohol and possibly benzos. Symptoms ongoing since last Sunday. Symptoms include shaking, not eating, seeing things that are not there.

## 2014-07-14 NOTE — ED Notes (Signed)
Dr. Jacubowitz at the bedside 

## 2014-07-14 NOTE — Progress Notes (Signed)
MD paged to clarify need for suicide precautions and suicide sitter.Patient does not need suicide precaution per MD. Patient denies suicidal ideation at this time.

## 2014-07-14 NOTE — ED Provider Notes (Signed)
CSN: [id number]     Arrival date & time 07/14/14  1325 History   First MD Initiated Contact with Patient 07/14/14 1445     Chief Complaint  Patient presents with  . Alcohol Problem  . Delirium Tremens (DTS)   Level V caveat unstable vital signs  (Consider location/radiation/quality/duration/timing/severity/associated sxs/prior Treatment) Patient is a 68 y.o. female presenting with alcohol problem.  Alcohol Problem   complains of feeling alcohol withdrawal since his morning. She started a drinking binge 1 week ago. She feels tremulous and has visual hallucinations seeing dogs. She treated herself with Danella extract which she reports that contains 40% alcohol this morning. She also reports feeling tremulous. Nothing makes symptoms better or worse. SHe is currently asking for Librium. Past Medical History  Diagnosis Date  . Alcohol abuse   . Diabetes mellitus   . Hypertension   . Fibromyalgia   . Depression   . Carotid artery stenosis    Past Surgical History  Procedure Laterality Date  . Cesarean section    . Tonsillectomy    . Bunionectomy     Family History  Problem Relation Age of Onset  . Cancer Mother   . Heart disease Father   . Heart disease Sister    History  Substance Use Topics  . Smoking status: Former Smoker    Quit date: 03/11/2010  . Smokeless tobacco: Never Used  . Alcohol Use: Yes     Comment: 4-5 bottles of wine per day   OB History   Grav Para Term Preterm Abortions TAB SAB Ect Mult Living                 Review of Systems  Unable to perform ROS: Unstable vital signs  Psychiatric/Behavioral: Positive for agitation.       Hallucinations      Allergies  Other; Shellfish allergy; Latex; Lipitor; Codeine; Pentazocine lactate; and Sulfonamide derivatives  Home Medications   Prior to Admission medications   Medication Sig Start Date End Date Taking? Authorizing Provider  b complex vitamins tablet Take 1 tablet by mouth daily.    Historical  Provider, MD  diphenoxylate-atropine (LOMOTIL) 2.5-0.025 MG per tablet Take 1 tablet by mouth 4 (four) times daily as needed for diarrhea or loose stools.    Historical Provider, MD  escitalopram (LEXAPRO) 20 MG tablet Take 20 mg by mouth daily.    Historical Provider, MD  ezetimibe (ZETIA) 10 MG tablet Take 1 tablet (10 mg total) by mouth daily. 07/09/14   Philemon Kingdom, MD  lisinopril (PRINIVIL,ZESTRIL) 5 MG tablet Take 1 tablet (5 mg total) by mouth daily. 07/09/14   Philemon Kingdom, MD  LORazepam (ATIVAN) 1 MG tablet Take 1 mg by mouth every 8 (eight) hours as needed for anxiety.    Historical Provider, MD  LORazepam (ATIVAN) 1 MG tablet Take 1 tablet (1 mg total) by mouth every 6 (six) hours as needed for anxiety. 06/25/14   Varney Biles, MD  nebivolol (BYSTOLIC) 5 MG tablet Take 1 tablet (5 mg total) by mouth daily. 07/09/14   Philemon Kingdom, MD  sitaGLIPtin (JANUVIA) 100 MG tablet Take 1 tablet (100 mg total) by mouth daily. 07/09/14   Philemon Kingdom, MD   BP 90/47  Pulse 73  Temp(Src) 98 F (36.7 C) (Oral)  Resp 15  Ht 5' 3.75" (1.619 m)  Wt 168 lb (76.204 kg)  BMI 29.07 kg/m2  SpO2 100% Physical Exam  Nursing note and vitals reviewed. Constitutional: She is oriented to person,  place, and time. She appears well-developed and well-nourished.  HENT:  Head: Normocephalic and atraumatic.  Eyes: Conjunctivae are normal. Pupils are equal, round, and reactive to light.  Neck: Neck supple. No tracheal deviation present. No thyromegaly present.  Cardiovascular: Normal rate.   No murmur heard. Mildly tachycardic  Pulmonary/Chest: Effort normal and breath sounds normal.  Abdominal: Soft. Bowel sounds are normal. She exhibits no distension. There is no tenderness.  Musculoskeletal: Normal range of motion. She exhibits no edema and no tenderness.  Neurological: She is alert and oriented to person, place, and time. No cranial nerve deficit. Coordination normal.  Skin: Skin is warm and  dry. No rash noted.  Psychiatric:  Mildly anxious    ED Course  Procedures (including critical care time) Labs Review Labs Reviewed  CBC - Abnormal; Notable for the following:    RDW 18.3 (*)    All other components within normal limits  COMPREHENSIVE METABOLIC PANEL - Abnormal; Notable for the following:    Sodium 136 (*)    Chloride 90 (*)    CO2 16 (*)    Glucose, Bld 118 (*)    Creatinine, Ser 1.32 (*)    GFR calc non Af Amer 41 (*)    GFR calc Af Amer 48 (*)    Anion gap 30 (*)    All other components within normal limits  ETHANOL - Abnormal; Notable for the following:    Alcohol, Ethyl (B) 215 (*)    All other components within normal limits  SALICYLATE LEVEL - Abnormal; Notable for the following:    Salicylate Lvl <2.9 (*)    All other components within normal limits  ACETAMINOPHEN LEVEL  URINE RAPID DRUG SCREEN (HOSP PERFORMED)    Imaging Review No results found.   EKG Interpretation   Date/Time:  Sunday July 14 2014 14:44:20 EDT Ventricular Rate:  76 PR Interval:  145 QRS Duration: 72 QT Interval:  415 QTC Calculation: 467 R Axis:   -7 Text Interpretation:  Sinus rhythm Low voltage, precordial leads Since  last tracing rate slower Confirmed by Winfred Leeds  MD, Kattleya Kuhnert [phone]) on  07/14/2014 4:33:06 PM      Results for orders placed during the hospital encounter of 07/14/14  ACETAMINOPHEN LEVEL      Result Value Ref Range   Acetaminophen (Tylenol), Serum <15.0  10 - 30 ug/mL  CBC      Result Value Ref Range   WBC 6.2  4.0 - 10.5 K/uL   RBC 3.99  3.87 - 5.11 MIL/uL   Hemoglobin 12.9  12.0 - 15.0 g/dL   HCT 38.8  36.0 - 46.0 %   MCV 97.2  78.0 - 100.0 fL   MCH 32.3  26.0 - 34.0 pg   MCHC 33.2  30.0 - 36.0 g/dL   RDW 18.3 (*) 11.5 - 15.5 %   Platelets 216  150 - 400 K/uL  COMPREHENSIVE METABOLIC PANEL      Result Value Ref Range   Sodium 136 (*) 137 - 147 mEq/L   Potassium 4.6  3.7 - 5.3 mEq/L   Chloride 90 (*) 96 - 112 mEq/L   CO2 16 (*) 19 - 32  mEq/L   Glucose, Bld 118 (*) 70 - 99 mg/dL   BUN 19  6 - 23 mg/dL   Creatinine, Ser 1.32 (*) 0.50 - 1.10 mg/dL   Calcium 8.5  8.4 - 10.5 mg/dL   Total Protein 7.8  6.0 - 8.3 g/dL   Albumin 4.3  3.5 - 5.2 g/dL   AST 37  0 - 37 U/L   ALT 20  0 - 35 U/L   Alkaline Phosphatase 92  39 - 117 U/L   Total Bilirubin 0.7  0.3 - 1.2 mg/dL   GFR calc non Af Amer 41 (*) >90 mL/min   GFR calc Af Amer 48 (*) >90 mL/min   Anion gap 30 (*) 5 - 15  ETHANOL      Result Value Ref Range   Alcohol, Ethyl (B) 215 (*) 0 - 11 mg/dL  SALICYLATE LEVEL      Result Value Ref Range   Salicylate Lvl <4.1 (*) 2.8 - 20.0 mg/dL  pulse ox 87% on room  Air consistent with mild hypoxemia No results found.  PortableChest xray viewed by me , preliminarily  shows mild cardiomegaly no clear lung fields 4:20 PM after 2 L normal saline administered. Patient is dozing, arousable to verbal stimulus. Answers appropriately. MDM  Final diagnoses:  None   Benzodiazepines not administered initially do to hypotension. Patient requires fluid resuscitation. Metabolic acidosis with bicarbonate of 16 likely secondary to alcoholic ketoacidosis Spoke with Dr.Mikhail plan admit step down unit Diagnosis #1 alcohol abuse #2 metabolic ketoacidosis #3 renal insufficiency #4 hypotension #5 hypoxiemia CRITICAL CARE Performed by: Orlie Dakin Total critical care time: 30 minute Critical care time was exclusive of separately billable procedures and treating other patients. Critical care was necessary to treat or prevent imminent or life-threatening deterioration. Critical care was time spent personally by me on the following activities: development of treatment plan with patient and/or surrogate as well as nursing, discussions with consultants, evaluation of patient's response to treatment, examination of patient, obtaining history from patient or surrogate, ordering and performing treatments and interventions, ordering and review of  laboratory studies, ordering and review of radiographic studies, pulse oximetry and re-evaluation of patient's condition.    Orlie Dakin, MD 07/14/14 1650

## 2014-07-14 NOTE — H&P (Signed)
[facility] History and Physical  CASH MEADOW WUJ:[id number] DOB: 1946/08/12 DOA: 07/14/2014  Referring physician:  PCP: Pcp Not In System  Specialists:   Chief Complaint: Alcohol withdrawal  HPI: [name] is a 68 y.o. female  Retired Marine scientist with a history of alcohol abuse, hypertension, diabetes mellitus, that presents to the emergency department with complaints of alcohol withdrawal. Patient in one or one week drinking binge. She states that she has had several drinking binges and withdrawal, detox the last several years. Patient has been doing this since her son committed suicide in 2003. Today, patient reports feeling tremulous and shaky. She was unable to go to the grocery store and obtain alcohol this morning therefore her husband went and obtained vanilla extract.  She complains of having visual hallucinations and seeing dogs.  Patient states she has been drinking approximately 3 boxes containing containers of wine per day. She complains of nausea as well as vomiting. She states she has not eaten and within 3 days. Patient does state that she is wanting to go to detox.  Patient does see a psychiatrist, Dr. Humphrey Rolls.    Review of Systems:  Constitutional: Patient states she has no appetite, has not been for 3 days. HEENT: Denies photophobia, eye pain, redness, hearing loss, ear pain, congestion, sore throat, rhinorrhea, sneezing, mouth sores, trouble swallowing, neck pain, neck stiffness and tinnitus.   Respiratory: Denies SOB, DOE, cough, chest tightness, and wheezing.   Cardiovascular: Denies chest pain, palpitations and leg swelling.  Gastrointestinal: Complains of nausea and vomiting. Denies any diarrhea or constipation. Has history of IBS.  Genitourinary: Denies dysuria, urgency, frequency, hematuria, flank pain and difficulty urinating.  Musculoskeletal: Denies myalgias, back pain, joint swelling, arthralgias and gait problem.  Skin: Denies pallor, rash and wound.    Neurological: Denies any headaches, numbness or tingling or seizures. Complains of feeling shaky Hematological: Denies adenopathy. Easy bruising, personal or family bleeding history  Psychiatric/Behavioral: Patient complains of depression, and alcohol withdrawal. She had visual disturbances and hallucinations earlier today.  Past Medical History  Diagnosis Date  . Alcohol abuse   . Diabetes mellitus   . Hypertension   . Fibromyalgia   . Depression   . Carotid artery stenosis    Past Surgical History  Procedure Laterality Date  . Cesarean section    . Tonsillectomy    . Bunionectomy     Social History:  reports that she quit smoking about 4 years ago. She has never used smokeless tobacco. She reports that she drinks alcohol. She reports that she does not use illicit drugs. 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  Allergies  Allergen Reactions  . Other Anaphylaxis    shellfish  . Shellfish Allergy Anaphylaxis  . Latex Other (See Comments)    Break outs  . Lipitor [Atorvastatin Calcium] Other (See Comments)    Muscle aches  . Codeine Rash    And nausea   . Pentazocine Lactate Rash  . Sulfonamide Derivatives Rash    Family History  Problem Relation Age of Onset  . Cancer Mother   . Heart disease Father   . Heart disease Sister     Prior to Admission medications   Medication Sig Start Date End Date Taking? Authorizing Provider  b complex vitamins tablet Take 1 tablet by mouth daily.    Historical Provider, MD  diphenoxylate-atropine (LOMOTIL) 2.5-0.025 MG per tablet Take 1 tablet by mouth 4 (four) times daily as needed for diarrhea or loose stools.  Historical Provider, MD  escitalopram (LEXAPRO) 20 MG tablet Take 20 mg by mouth daily.    Historical Provider, MD  ezetimibe (ZETIA) 10 MG tablet Take 1 tablet (10 mg total) by mouth daily. 07/09/14   Philemon Kingdom, MD  lisinopril (PRINIVIL,ZESTRIL) 5 MG tablet Take 1 tablet (5 mg total) by mouth daily. 07/09/14    Philemon Kingdom, MD  LORazepam (ATIVAN) 1 MG tablet Take 1 mg by mouth every 8 (eight) hours as needed for anxiety.    Historical Provider, MD  LORazepam (ATIVAN) 1 MG tablet Take 1 tablet (1 mg total) by mouth every 6 (six) hours as needed for anxiety. 06/25/14   Varney Biles, MD  nebivolol (BYSTOLIC) 5 MG tablet Take 1 tablet (5 mg total) by mouth daily. 07/09/14   Philemon Kingdom, MD  sitaGLIPtin (JANUVIA) 100 MG tablet Take 1 tablet (100 mg total) by mouth daily. 07/09/14   Philemon Kingdom, MD   Physical Exam: Filed Vitals:   07/14/14 1645  BP: 126/60  Pulse: 94  Temp:   Resp: 17     General: Well developed, well nourished, NAD, appears stated age  HEENT: NCAT, PERRLA, EOMI, Anicteic Sclera, mucous membranes dry  Neck: Supple, no JVD, no masses  Cardiovascular: S1 S2 auscultated, no rubs, murmurs or gallops. Regular rate and rhythm.  Respiratory: Clear to auscultation bilaterally with equal chest rise  Abdomen: Soft, nontender, nondistended, + bowel sounds  Extremities: warm dry without cyanosis clubbing or edema  Neuro: AAOx3, cranial nerves grossly intact. Strength 5/5 in patient's upper and lower extremities bilaterally  Skin: Without rashes exudates or nodules  Psych: Anxious affect and demeanor, intact insight and judgment  Labs on Admission:  Basic Metabolic Panel:  Recent Labs Lab 07/14/14 1427  NA 136*  K 4.6  CL 90*  CO2 16*  GLUCOSE 118*  BUN 19  CREATININE 1.32*  CALCIUM 8.5   Liver Function Tests:  Recent Labs Lab 07/14/14 1427  AST 37  ALT 20  ALKPHOS 92  BILITOT 0.7  PROT 7.8  ALBUMIN 4.3   No results found for this basename: LIPASE, AMYLASE,  in the last 168 hours No results found for this basename: AMMONIA,  in the last 168 hours CBC:  Recent Labs Lab 07/14/14 1427  WBC 6.2  HGB 12.9  HCT 38.8  MCV 97.2  PLT 216   Cardiac Enzymes: No results found for this basename: CKTOTAL, CKMB, CKMBINDEX, TROPONINI,  in the last 168  hours  BNP (last 3 results) No results found for this basename: PROBNP,  in the last 8760 hours CBG: No results found for this basename: GLUCAP,  in the last 168 hours  Radiological Exams on Admission: Dg Chest Portable 1 View  07/14/2014   CLINICAL DATA:  Alcohol withdrawal, delirium tremens  EXAM: PORTABLE CHEST - 1 VIEW  COMPARISON:  06/20/2012  FINDINGS: The heart size and mediastinal contours are within normal limits. Both lungs are clear but hypoaerated. The visualized skeletal structures are unremarkable.  IMPRESSION: No active disease.   Electronically Signed   By: Conchita Paris M.D.   On: 07/14/2014 16:54    EKG: Independently reviewed. Sinus rhythm, rate 76  Assessment/Plan  Alcohol intoxication and withdrawal -Patient will be admitted to step down unit for closer monitoring -Will place patient on alcohol protocol with Librium -Will place patient on banana bag as well as normal saline -Will place patient on seizure precautions. -Will consult psychiatry as well as case management for possible detox placement -Will obtain  magnesium, phosphate level -Will place patient on a diet  Hypotension -Patient was given approximately 2 L of fluid in the emergency department, her blood pressure had reason some however continues to be on the softer side. -Patient will be admitted to step down -Will continue IV fluids -Will patient's hypertensive medications  Acute kidney injury -Patient baseline creatinine is approximately 0.5, currently 1.32 -Likely secondary to dehydration -Continue IV fluids, will continue to monitor her creatinine level.  Hypertension -Due to hypotensive episodes, hold her hypertensive medications.  Diabetes mellitus type 2 -Will Januvia at this time. -Patient be placed on a liquid diet. -Will place patient on insulin sliding scale hold CBG monitoring.  Anion gap Metabolic acidosis -Secondary to alcohol -Suspect will correct with IV fluids -Will  continue to monitor  Hypovolemic Hyponatremia and hypochloremia -Secondary dehydration secondary to GI losses -Continue IV fluids and monitor BMP.  Nausea and vomiting -Secondary to alcohol intoxication withdrawal -Will place patient on antibiotics.  Depression -Continue Lexapro  DVT prophylaxis: Lovenox  Code Status: Full  Condition: Guarded  Family Communication: None at bedside. Admission, patients condition and plan of care including tests being ordered have been discussed with the patient, who indicates understanding and agrees with the plan and Code Status.  Disposition Plan: Admitted.   Time spent: 60 minutes  Lorieann Argueta D.O. [facility] Pager [phone]  If 7PM-7AM, please contact night-coverage [email or web address] Password [email or web address] 07/14/2014, 5:03 PM

## 2014-07-14 NOTE — ED Notes (Signed)
Haley RN notified of vital signs and came to room. Patient also states unable to provide urine specimen at this time.

## 2014-07-15 DIAGNOSIS — F1994 Other psychoactive substance use, unspecified with psychoactive substance-induced mood disorder: Secondary | ICD-10-CM

## 2014-07-15 DIAGNOSIS — F1010 Alcohol abuse, uncomplicated: Secondary | ICD-10-CM

## 2014-07-15 LAB — GLUCOSE, CAPILLARY
Glucose-Capillary: 92 mg/dL (ref 70–99)
Glucose-Capillary: 98 mg/dL (ref 70–99)
Glucose-Capillary: 86 mg/dL (ref 70–99)
Glucose-Capillary: 94 mg/dL (ref 70–99)
Glucose-Capillary: 115 mg/dL — ABNORMAL HIGH (ref 70–99)
Glucose-Capillary: 151 mg/dL — ABNORMAL HIGH (ref 70–99)
Glucose-Capillary: 122 mg/dL — ABNORMAL HIGH (ref 70–99)
Glucose-Capillary: 167 mg/dL — ABNORMAL HIGH (ref 70–99)

## 2014-07-15 LAB — BASIC METABOLIC PANEL
Anion gap: 16 — ABNORMAL HIGH (ref 5–15)
BUN: 15 mg/dL (ref 6–23)
CO2: 22 mEq/L (ref 19–32)
Calcium: 7.3 mg/dL — ABNORMAL LOW (ref 8.4–10.5)
Chloride: 98 mEq/L (ref 96–112)
Creatinine, Ser: 0.58 mg/dL (ref 0.50–1.10)
GFR calc Af Amer: >90 mL/min (ref >90)
GFR calc non Af Amer: >90 mL/min (ref >90)
Glucose, Bld: 146 mg/dL — ABNORMAL HIGH (ref 70–99)
Potassium: 4.6 mEq/L (ref 3.7–5.3)
Sodium: 136 mEq/L — ABNORMAL LOW (ref 137–147)

## 2014-07-15 LAB — HEMOGLOBIN A1C
Hgb A1c MFr Bld: 7.1 % — ABNORMAL HIGH (ref <5.7)
Mean Plasma Glucose: 157 mg/dL — ABNORMAL HIGH (ref <117)

## 2014-07-15 MED ORDER — INSULIN ASPART 100 UNIT/ML ~~LOC~~ SOLN
0.0000 [IU] | Freq: Three times a day (TID) | SUBCUTANEOUS | Status: DC
Start: 2014-07-15 — End: 2014-07-17
  Administered 2014-07-15 – 2014-07-17 (×4): 2 [IU] via SUBCUTANEOUS

## 2014-07-15 NOTE — Progress Notes (Signed)
NURSING PROGRESS NOTE  [name] [id number] Transfer Data: 07/15/2014 7:56 PM Attending Provider: Cristal Ford, DO PCP:Pcp Not In System Code Status: full [name] is a 68 y.o. female patient transferred from [facility] -No acute distress noted.  -No complaints of shortness of breath.  -No complaints of chest pain.    Blood pressure 139/66, pulse 74, temperature 98 F (36.7 C), temperature source Oral, resp. rate 16, height 5\' 3"  (1.6 m), weight 76.7 kg (169 lb 1.5 oz), SpO2 91.00%.   IV Fluids:  IV in place, occlusive dsg intact without redness, IV cath wrist left, condition patent and no redness   Allergies:  Latex; Lipitor; Codeine; Pentazocine lactate; and Sulfonamide derivatives  Past Medical History:   has a past medical history of Alcohol abuse; Diabetes mellitus; Hypertension; Fibromyalgia; Depression; and Carotid artery stenosis.  Past Surgical History:   has past surgical history that includes Cesarean section; Tonsillectomy; and Bunionectomy.   Patient/Family orientated to room. Information packet given to patient/family. Admission inpatient armband information verified with patient/family to include name and date of birth and placed on patient arm. Side rails up x 2, fall assessment and education completed with patient/family. Patient/family able to verbalize understanding of risk associated with falls and verbalized understanding to call for assistance before getting out of bed. Call light within reach. Patient/family able to voice and demonstrate understanding of unit orientation instructions.    Will continue to evaluate and treat per MD orders.  Wallie Renshaw, RN

## 2014-07-15 NOTE — Progress Notes (Signed)
[facility]                                                                              Patient Demographics  [name], is a 68 y.o. female, DOB - 12-05-1946, SHF:[id number]  Admit date - 07/14/2014   Admitting Physician Cristal Ford, DO  Outpatient Primary MD for the patient is Pcp Not In System  LOS - 1   Chief Complaint  Patient presents with  . Alcohol Problem  . 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 Patient has been binge drinking for one week. She stated that she has had several drinking binges and withdrawal, detox over the last several years. Patient has been doing this since her son committed suicide in 2003. On the day of admission, patient reported feeling tremulous and shaky. She was unable to go to the grocery store and obtain alcohol, therefore her husband went and obtained vanilla extract, which has 40% alcohol. She complained of having visual hallucinations and seeing dogs. Patient stated she had been drinking approximately 3 boxes containing containers of wine per day. She complained of nausea as well as vomiting. She stated she had not eaten within 3 days. Patient stated that she is wanting to go to detox. Patient does see a psychiatrist, Dr. Humphrey Rolls.   Interim history Continue librium for alcohol withdrawal.  Pending Psych eval.  Likely discharge within 1-2 days.  Assessment & Plan   Alcohol intoxication and withdrawal  -Continue CIWA protocol with Librium -patient is not suicidal -Patient appears to be stable, no DTs -Continue thiamine, multivitamin, folic acid -Pending Psychiatry consult -Continue seizure precautions  Hypotension  -Resolved -Patient was given approximately 2 L of fluid in the emergency department, and IVF, BP has responded well  Acute  kidney injury  -Patient baseline creatinine is approximately 0.5, upon admission was 1.32  -Likely secondary to dehydration  -Pending BMP this morning  Hypertension  -Will restart home BP meds  Diabetes mellitus type 2  -Will Januvia at this time.  -Patient be placed on a liquid diet.  -Will place patient on insulin sliding scale hold CBG monitoring.   Anion gap Metabolic acidosis  -Secondary to alcohol  -Pending BMP this morning.  Hypovolemic Hyponatremia and hypochloremia  -Secondary dehydration secondary to GI losses  -Was placed on IVF, pending labs this morning.  Nausea and vomiting  -Resolved, Secondary to alcohol intoxication withdrawal  -Initially placed on liquid diet, will advance as tolerated -Continue antiemetics as needed  Depression  -Continue Lexapro  Code Status: Full  Family Communication: None at bedside  Disposition Plan: Admitted, pending psych consult  Time Spent in minutes   30 minutes  Procedures  None  Consults   Psychiatry  DVT Prophylaxis  Lovenox  Lab Results  Component Value Date   PLT 216 07/14/2014    Medications  Scheduled Meds: . chlordiazePOXIDE  25 mg Oral QID   Followed by  . chlordiazePOXIDE  25 mg Oral TID   Followed by  . [START  ON 07/16/2014] chlordiazePOXIDE  25 mg Oral BH-qamhs   Followed by  . [START ON 07/17/2014] chlordiazePOXIDE  25 mg Oral Daily  . enoxaparin (LOVENOX) injection  40 mg Subcutaneous Q24H  . escitalopram  20 mg Oral Daily  . ezetimibe  10 mg Oral Daily  . folic acid  1 mg Oral Daily  . insulin aspart  0-15 Units Subcutaneous TID WC  . multivitamin with minerals  1 tablet Oral Daily  . thiamine  100 mg Intravenous Daily  . thiamine  100 mg Oral Daily   Continuous Infusions:  PRN Meds:.acetaminophen, acetaminophen, chlordiazePOXIDE, hydrOXYzine, loperamide, ondansetron (ZOFRAN) IV, ondansetron, ondansetron  Antibiotics    Anti-infectives   None      Subjective:   [name] seen  and examined today.  Patient states she is feeling better and that she is no longer have nausea or vomiting and would like to eat.  She no longer feels "shaky" and denies tremors.  She denies chest pain, shortness of breath, abdominal pain, hallucinations, headaches.    Objective:   Filed Vitals:   07/15/14 0400 07/15/14 0500 07/15/14 0600 07/15/14 0804  BP: 137/56 125/57 126/59 114/90  Pulse: 77 70 64 84  Temp:    97.9 F (36.6 C)  TempSrc:    Axillary  Resp: 16 16 17 17   Height:      Weight:      SpO2: 98% 95% 97% 96%    Wt Readings from Last 3 Encounters:  07/14/14 76.7 kg (169 lb 1.5 oz)  07/09/14 76.204 kg (168 lb)  06/23/14 71.215 kg (157 lb)     Intake/Output Summary (Last 24 hours) at 07/15/14 0849 Last data filed at 07/15/14 0707  Gross per 24 hour  Intake   1480 ml  Output   1100 ml  Net    380 ml    Exam General: Well developed, well nourished, NAD, appears stated age  HEENT: NCAT, mucous membranes moist  Cardiovascular: S1 S2 auscultated, no rubs, murmurs or gallops. Regular rate and rhythm.  Respiratory: Clear to auscultation bilaterally with equal chest rise  Abdomen: Soft, nontender, nondistended, + bowel sounds  Extremities: warm dry without cyanosis clubbing or edema  Neuro: AAOx3, No focal deficits Skin: Without rashes exudates or nodules  Psych: Appropriate mood, affect and demeanor, intact insight and judgment  Data Review   Micro Results Recent Results (from the past 240 hour(s))  MRSA PCR SCREENING     Status: None   Collection Time    07/14/14  6:04 PM      Result Value Ref Range Status   MRSA by PCR NEGATIVE  NEGATIVE Final   Comment:            The GeneXpert MRSA Assay (FDA     approved for NASAL specimens     only), is one component of a     comprehensive MRSA colonization     surveillance program. It is not     intended to diagnose MRSA     infection nor to guide or     monitor treatment for     MRSA infections.    Radiology  Reports Dg Chest Portable 1 View  07/14/2014   CLINICAL DATA:  Alcohol withdrawal, delirium tremens  EXAM: PORTABLE CHEST - 1 VIEW  COMPARISON:  06/20/2012  FINDINGS: The heart size and mediastinal contours are within normal limits. Both lungs are clear but hypoaerated. The visualized skeletal structures are unremarkable.  IMPRESSION: No active disease.  Electronically Signed   By: Conchita Paris M.D.   On: 07/14/2014 16:54    CBC  Recent Labs Lab 07/14/14 1427  WBC 6.2  HGB 12.9  HCT 38.8  PLT 216  MCV 97.2  MCH 32.3  MCHC 33.2  RDW 18.3*    Chemistries   Recent Labs Lab 07/14/14 1427  NA 136*  K 4.6  CL 90*  CO2 16*  GLUCOSE 118*  BUN 19  CREATININE 1.32*  CALCIUM 8.5  MG 2.4  AST 37  ALT 20  ALKPHOS 92  BILITOT 0.7   ------------------------------------------------------------------------------------------------------------------ estimated creatinine clearance is 41.1 ml/min (by C-G formula based on Cr of 1.32). ------------------------------------------------------------------------------------------------------------------  Recent Labs  07/14/14 1427  HGBA1C 7.1*   ------------------------------------------------------------------------------------------------------------------ No results found for this basename: CHOL, HDL, LDLCALC, TRIG, CHOLHDL, LDLDIRECT,  in the last 72 hours ------------------------------------------------------------------------------------------------------------------ No results found for this basename: TSH, T4TOTAL, FREET3, T3FREE, THYROIDAB,  in the last 72 hours ------------------------------------------------------------------------------------------------------------------ No results found for this basename: VITAMINB12, FOLATE, FERRITIN, TIBC, IRON, RETICCTPCT,  in the last 72 hours  Coagulation profile No results found for this basename: INR, PROTIME,  in the last 168 hours  No results found for this basename: DDIMER,  in  the last 72 hours  Cardiac Enzymes No results found for this basename: CK, CKMB, TROPONINI, MYOGLOBIN,  in the last 168 hours ------------------------------------------------------------------------------------------------------------------ No components found with this basename: POCBNP,     Arisbel Maione D.O. on 07/15/2014 at 8:49 AM  Between 7am to 7pm - Pager - [phone]  After 7pm go to [email or web address] - password [email or web address]  And look for the night coverage person covering for me after hours  [facility] Office  [phone]

## 2014-07-15 NOTE — Consult Note (Addendum)
[place] Psychiatry Consult   Reason for Consult:  Alcohol withdrawal and dependence Referring Physician:  Dr. Rosalee Kaufman is an 68 y.o. female. Total Time spent with patient: 45 minutes  Assessment: AXIS I:  Substance Induced Mood Disorder and Alcohol withdrawal and dependence AXIS II:  Deferred AXIS III:   Past Medical History  Diagnosis Date  . Alcohol abuse   . Diabetes mellitus   . Hypertension   . Fibromyalgia   . Depression   . Carotid artery stenosis    AXIS IV:  other psychosocial or environmental problems, problems related to social environment and problems with primary support group AXIS V:  41-50 serious symptoms  Plan:  No evidence of imminent risk to self or others at present.   Patient does not meet criteria for psychiatric inpatient admission. Supportive therapy provided about ongoing stressors. Discussed crisis plan, support from social network, calling 911, coming to the Emergency Department, and calling Suicide Hotline. Referred to out patient psychiatric care when medically stable.  Subjective:   [name] is a 68 y.o. female patient admitted with alcohol intoxication and withdrawal.  HPI:  Patient is seen, chart reviewed and completed face-to-face psychiatric evaluation. Patient appeared lying down in her bed with cardiac monitoring and IV fluids. Patient reported she has been relapsing alcoholism for the last 2 years. Patient reported being sober over lasting from 6 months to 6 years in the past. Patient is hoping to stay sober once completed and detox treatment. Patient declined rehabilitation treatment after detox treatment. Patient is currently receiving Librium detox treatment and continued to have a withdrawal symptoms. Patient reported she failed to detox at home secondary to significant withdrawal symptoms of shaking, sweating and changes in her blood pressure and heart rate. Patient stopped going for the [place] meetings because it  cost money since she has no personal transportation. Patient retired from travel Therapist, sports about 17 years ago. Patient stated she has been in the middle of a divorce from her husband x 26 years. Patient reported she has a plans of going to the her ex-family members in [place].   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 Patient in one or one week drinking binge. She states that she has had several drinking binges and withdrawal, detox the last several years. Patient has been doing this since her son committed suicide in 2003. Today, patient reports feeling tremulous and shaky. She was unable to go to the grocery store and obtain alcohol this morning therefore her husband went and obtained vanilla extract. She complains of having visual hallucinations and seeing dogs. Patient states she has been drinking approximately 3 boxes containing containers of wine per day. She complains of nausea as well as vomiting. She states she has not eaten and within 3 days. Patient does state that she is wanting to go to detox. Patient does see a psychiatrist, Dr. Toy Care, patient bled alcohol level is 215 on admission  HPI Elements:   Location:  substance abuse and depression. Quality:  poor. Severity:  acute. Timing:  not clear.  Past Psychiatric History: Past Medical History  Diagnosis Date  . Alcohol abuse   . Diabetes mellitus   . Hypertension   . Fibromyalgia   . Depression   . Carotid artery stenosis     reports that she quit smoking about 4  years ago. She has never used smokeless tobacco. She reports that she drinks alcohol. She reports that she does not use illicit drugs. Family History  Problem Relation Age of Onset  . Cancer Mother   . Heart disease Father   . Heart disease Sister      Living Arrangements: Alone   Abuse/Neglect  [facility]) Physical Abuse: Denies Verbal Abuse: Denies Sexual Abuse: Denies Allergies:   Allergies  Allergen Reactions  . Latex Other (See Comments)    Break outs  . Lipitor [Atorvastatin Calcium] Other (See Comments)    Muscle aches  . Codeine Rash    And nausea   . Pentazocine Lactate Rash  . Sulfonamide Derivatives Rash    ACT Assessment Complete:  NO Objective: Blood pressure 146/63, pulse 95, temperature 98.7 F (37.1 C), temperature source Oral, resp. rate 24, height 5' 3"  (1.6 m), weight 76.7 kg (169 lb 1.5 oz), SpO2 98.00%.Body mass index is 29.96 kg/(m^2). Results for orders placed during the hospital encounter of 07/14/14 (from the past 72 hour(s))  ACETAMINOPHEN LEVEL     Status: None   Collection Time    07/14/14  2:27 PM      Result Value Ref Range   Acetaminophen (Tylenol), Serum <15.0  10 - 30 ug/mL   Comment:            THERAPEUTIC CONCENTRATIONS VARY     SIGNIFICANTLY. A RANGE OF 10-30     ug/mL MAY BE AN EFFECTIVE     CONCENTRATION FOR MANY PATIENTS.     HOWEVER, SOME ARE BEST TREATED     AT CONCENTRATIONS OUTSIDE THIS     RANGE.     ACETAMINOPHEN CONCENTRATIONS     >150 ug/mL AT 4 HOURS AFTER     INGESTION AND >50 ug/mL AT 12     HOURS AFTER INGESTION ARE     OFTEN ASSOCIATED WITH TOXIC     REACTIONS.  CBC     Status: Abnormal   Collection Time    07/14/14  2:27 PM      Result Value Ref Range   WBC 6.2  4.0 - 10.5 K/uL   RBC 3.99  3.87 - 5.11 MIL/uL   Hemoglobin 12.9  12.0 - 15.0 g/dL   HCT 38.8  36.0 - 46.0 %   MCV 97.2  78.0 - 100.0 fL   MCH 32.3  26.0 - 34.0 pg   MCHC 33.2  30.0 - 36.0 g/dL   RDW 18.3 (*) 11.5 - 15.5 %   Platelets 216  150 - 400 K/uL  COMPREHENSIVE METABOLIC PANEL     Status: Abnormal   Collection Time    07/14/14  2:27 PM      Result Value Ref Range   Sodium 136 (*) 137 - 147 mEq/L   Potassium 4.6  3.7 - 5.3 mEq/L   Chloride 90 (*) 96 - 112 mEq/L   CO2 16 (*) 19 - 32 mEq/L   Glucose, Bld 118 (*) 70 - 99 mg/dL   BUN 19   6 - 23 mg/dL   Creatinine, Ser 1.32 (*) 0.50 - 1.10 mg/dL   Calcium 8.5  8.4 - 10.5 mg/dL   Total Protein 7.8  6.0 - 8.3 g/dL   Albumin 4.3  3.5 - 5.2 g/dL   AST 37  0 - 37 U/L   ALT 20  0 - 35 U/L   Alkaline Phosphatase 92  39 - 117 U/L   Total Bilirubin 0.7  0.3 -  1.2 mg/dL   GFR calc non Af Amer 41 (*) >90 mL/min   GFR calc Af Amer 48 (*) >90 mL/min   Comment: (NOTE)     The eGFR has been calculated using the CKD EPI equation.     This calculation has not been validated in all clinical situations.     eGFR's persistently <90 mL/min signify possible Chronic Kidney     Disease.   Anion gap 30 (*) 5 - 15  ETHANOL     Status: Abnormal   Collection Time    07/14/14  2:27 PM      Result Value Ref Range   Alcohol, Ethyl (B) 215 (*) 0 - 11 mg/dL   Comment:            LOWEST DETECTABLE LIMIT FOR     SERUM ALCOHOL IS 11 mg/dL     FOR MEDICAL PURPOSES ONLY  SALICYLATE LEVEL     Status: Abnormal   Collection Time    07/14/14  2:27 PM      Result Value Ref Range   Salicylate Lvl <0.1 (*) 2.8 - 20.0 mg/dL  MAGNESIUM     Status: None   Collection Time    07/14/14  2:27 PM      Result Value Ref Range   Magnesium 2.4  1.5 - 2.5 mg/dL  PHOSPHORUS     Status: Abnormal   Collection Time    07/14/14  2:27 PM      Result Value Ref Range   Phosphorus 4.8 (*) 2.3 - 4.6 mg/dL  HEMOGLOBIN A1C     Status: Abnormal   Collection Time    07/14/14  2:27 PM      Result Value Ref Range   Hemoglobin A1C 7.1 (*) <5.7 %   Comment: (NOTE)                                                                               According to the ADA Clinical Practice Recommendations for 2011, when     HbA1c is used as a screening test:      >=6.5%   Diagnostic of Diabetes Mellitus               (if abnormal result is confirmed)     5.7-6.4%   Increased risk of developing Diabetes Mellitus     References:Diagnosis and Classification of Diabetes Mellitus,Diabetes     UUVO,5366,44(IHKVQ 1):S62-S69 and Standards  of Medical Care in             Diabetes - 2011,Diabetes Care,2011,34 (Suppl 1):S11-S61.   Mean Plasma Glucose 157 (*) <117 mg/dL   Comment: Performed at [facility], [facility]     Status: Abnormal   Collection Time    07/14/14  5:51 PM      Result Value Ref Range   Glucose-Capillary 106 (*) 70 - 99 mg/dL  URINE RAPID DRUG SCREEN (HOSP PERFORMED)     Status: None   Collection Time    07/14/14  6:03 PM      Result Value Ref Range   Opiates NONE DETECTED  NONE DETECTED   Cocaine NONE DETECTED  NONE DETECTED   Benzodiazepines  NONE DETECTED  NONE DETECTED   Amphetamines NONE DETECTED  NONE DETECTED   Tetrahydrocannabinol NONE DETECTED  NONE DETECTED   Barbiturates NONE DETECTED  NONE DETECTED   Comment:            DRUG SCREEN FOR MEDICAL PURPOSES     ONLY.  IF CONFIRMATION IS NEEDED     FOR ANY PURPOSE, NOTIFY LAB     WITHIN 5 DAYS.                LOWEST DETECTABLE LIMITS     FOR URINE DRUG SCREEN     Drug Class       Cutoff (ng/mL)     Amphetamine      1000     Barbiturate      200     Benzodiazepine   299     Tricyclics       371     Opiates          300     Cocaine          300     THC              50  MRSA PCR SCREENING     Status: None   Collection Time    07/14/14  6:04 PM      Result Value Ref Range   MRSA by PCR NEGATIVE  NEGATIVE   Comment:            The GeneXpert MRSA Assay (FDA     approved for NASAL specimens     only), is one component of a     comprehensive MRSA colonization     surveillance program. It is not     intended to diagnose MRSA     infection nor to guide or     monitor treatment for     MRSA infections.  GLUCOSE, CAPILLARY     Status: None   Collection Time    07/14/14  7:15 PM      Result Value Ref Range   Glucose-Capillary 92  70 - 99 mg/dL  GLUCOSE, CAPILLARY     Status: None   Collection Time    07/15/14 12:26 AM      Result Value Ref Range   Glucose-Capillary 98  70 - 99 mg/dL  GLUCOSE, CAPILLARY     Status: None    Collection Time    07/15/14  5:31 AM      Result Value Ref Range   Glucose-Capillary 86  70 - 99 mg/dL  GLUCOSE, CAPILLARY     Status: None   Collection Time    07/15/14  8:03 AM      Result Value Ref Range   Glucose-Capillary 94  70 - 99 mg/dL   Comment 1 Documented in Chart    BASIC METABOLIC PANEL     Status: Abnormal   Collection Time    07/15/14  9:45 AM      Result Value Ref Range   Sodium 136 (*) 137 - 147 mEq/L   Potassium 4.6  3.7 - 5.3 mEq/L   Comment: HEMOLYSIS AT THIS LEVEL MAY AFFECT RESULT   Chloride 98  96 - 112 mEq/L   CO2 22  19 - 32 mEq/L   Glucose, Bld 146 (*) 70 - 99 mg/dL   BUN 15  6 - 23 mg/dL   Creatinine, Ser 0.58  0.50 - 1.10 mg/dL   Comment: RESULTS VERIFIED VIA RECOLLECT  Calcium 7.3 (*) 8.4 - 10.5 mg/dL   GFR calc non Af Amer >90  >90 mL/min   GFR calc Af Amer >90  >90 mL/min   Comment: (NOTE)     The eGFR has been calculated using the CKD EPI equation.     This calculation has not been validated in all clinical situations.     eGFR's persistently <90 mL/min signify possible Chronic Kidney     Disease.   Anion gap 16 (*) 5 - 15  GLUCOSE, CAPILLARY     Status: Abnormal   Collection Time    07/15/14 12:10 PM      Result Value Ref Range   Glucose-Capillary 115 (*) 70 - 99 mg/dL   Comment 1 Documented in Chart     Labs are reviewed and are pertinent for BAL 215..  Current Facility-Administered Medications  Medication Dose Route Frequency Provider Last Rate Last Dose  . acetaminophen (TYLENOL) tablet 650 mg  650 mg Oral Q6H PRN Maryann Mikhail, DO       Or  . acetaminophen (TYLENOL) suppository 650 mg  650 mg Rectal Q6H PRN Maryann Mikhail, DO      . chlordiazePOXIDE (LIBRIUM) capsule 25 mg  25 mg Oral Q6H PRN Maryann Mikhail, DO      . chlordiazePOXIDE (LIBRIUM) capsule 25 mg  25 mg Oral QID Maryann Mikhail, DO   25 mg at 07/15/14 2778   Followed by  . chlordiazePOXIDE (LIBRIUM) capsule 25 mg  25 mg Oral TID Maryann Mikhail, DO        Followed by  . [START ON 07/16/2014] chlordiazePOXIDE (LIBRIUM) capsule 25 mg  25 mg Oral BH-qamhs Maryann Mikhail, DO       Followed by  . [START ON 07/17/2014] chlordiazePOXIDE (LIBRIUM) capsule 25 mg  25 mg Oral Daily Maryann Mikhail, DO      . enoxaparin (LOVENOX) injection 40 mg  40 mg Subcutaneous Q24H Maryann Mikhail, DO   40 mg at 07/14/14 1945  . escitalopram (LEXAPRO) tablet 20 mg  20 mg Oral Daily Maryann Mikhail, DO   20 mg at 07/15/14 0955  . ezetimibe (ZETIA) tablet 10 mg  10 mg Oral Daily Maryann Mikhail, DO   10 mg at 07/15/14 0956  . folic acid (FOLVITE) tablet 1 mg  1 mg Oral Daily Maryann Mikhail, DO   1 mg at 07/15/14 0955  . hydrOXYzine (ATARAX/VISTARIL) tablet 25 mg  25 mg Oral Q6H PRN Maryann Mikhail, DO   25 mg at 07/14/14 1853  . insulin aspart (novoLOG) injection 0-15 Units  0-15 Units Subcutaneous TID WC Maryann Mikhail, DO      . loperamide (IMODIUM) capsule 2-4 mg  2-4 mg Oral PRN Maryann Mikhail, DO      . multivitamin with minerals tablet 1 tablet  1 tablet Oral Daily Maryann Mikhail, DO   1 tablet at 07/15/14 0955  . ondansetron (ZOFRAN) tablet 4 mg  4 mg Oral Q6H PRN Maryann Mikhail, DO       Or  . ondansetron (ZOFRAN) injection 4 mg  4 mg Intravenous Q6H PRN Maryann Mikhail, DO   4 mg at 07/14/14 2019  . ondansetron (ZOFRAN-ODT) disintegrating tablet 4 mg  4 mg Oral Q6H PRN Maryann Mikhail, DO      . thiamine (VITAMIN B-1) tablet 100 mg  100 mg Oral Daily Maryann Mikhail, DO   100 mg at 07/15/14 2423    Psychiatric Specialty Exam: Physical Exam as per history and physical   Review of  Systems  Musculoskeletal: Positive for falls.  Neurological: Positive for dizziness and tremors.  Endo/Heme/Allergies: Bruises/bleeds easily.  Psychiatric/Behavioral: Positive for substance abuse. The patient is nervous/anxious.     Blood pressure 146/63, pulse 95, temperature 98.7 F (37.1 C), temperature source Oral, resp. rate 24, height 5' 3"  (1.6 m), weight 76.7 kg (169 lb  1.5 oz), SpO2 98.00%.Body mass index is 29.96 kg/(m^2).  General Appearance: Guarded  Eye Contact::  Fair  Speech:  Clear and Coherent and Slow  Volume:  Normal  Mood:  Anxious and Depressed  Affect:  Appropriate and Congruent  Thought Process:  Coherent and Goal Directed  Orientation:  Full (Time, Place, and Person)  Thought Content:  WDL  Suicidal Thoughts:  No  Homicidal Thoughts:  No  Memory:  Immediate;   Good Recent;   Good  Judgement:  Intact  Insight:  Fair  Psychomotor Activity:  Decreased  Concentration:  Good  Recall:  Good  Fund of Knowledge:Good  Language: Good  Akathisia:  NA  Handed:  Right  AIMS (if indicated):     Assets:  Communication Skills Desire for Improvement Financial Resources/Insurance Housing Leisure Time Resilience Social Support Talents/Skills Transportation  Sleep:      Musculoskeletal: Strength & Muscle Tone: within normal limits Gait & Station: unable to stand Patient leans: N/A  Treatment Plan Summary: Daily contact with patient to assess and evaluate symptoms and progress in treatment Medication management Referred to the outpatient psychiatric services with [facility] psychiatric services and substance abuse treatment when medically stable  Gwendy Boeder,JANARDHAHA R. 07/15/2014 1:49 PM

## 2014-07-15 NOTE — Progress Notes (Signed)
Chaplain responded to spiritual care consult, attempting to visit pt at two different times today, but pt sleeping. Please page at [phone] if pt needs support.

## 2014-07-15 NOTE — Progress Notes (Signed)
Report received from RN Naydene at [facility] for patient to be transferred into 5w21

## 2014-07-16 DIAGNOSIS — F1020 Alcohol dependence, uncomplicated: Secondary | ICD-10-CM

## 2014-07-16 LAB — CBC
HCT: 34.6 % — ABNORMAL LOW (ref 36.0–46.0)
Hemoglobin: 11.3 g/dL — ABNORMAL LOW (ref 12.0–15.0)
MCH: 32.1 pg (ref 26.0–34.0)
MCHC: 32.7 g/dL (ref 30.0–36.0)
MCV: 98.3 fL (ref 78.0–100.0)
Platelets: 111 10*3/uL — ABNORMAL LOW (ref 150–400)
RBC: 3.52 MIL/uL — ABNORMAL LOW (ref 3.87–5.11)
RDW: 17.3 % — ABNORMAL HIGH (ref 11.5–15.5)
WBC: 2.9 10*3/uL — ABNORMAL LOW (ref 4.0–10.5)

## 2014-07-16 LAB — BASIC METABOLIC PANEL
Anion gap: 13 (ref 5–15)
BUN: 13 mg/dL (ref 6–23)
CO2: 24 mEq/L (ref 19–32)
Calcium: 7.6 mg/dL — ABNORMAL LOW (ref 8.4–10.5)
Chloride: 103 mEq/L (ref 96–112)
Creatinine, Ser: 0.54 mg/dL (ref 0.50–1.10)
GFR calc Af Amer: >90 mL/min (ref >90)
GFR calc non Af Amer: >90 mL/min (ref >90)
Glucose, Bld: 133 mg/dL — ABNORMAL HIGH (ref 70–99)
Potassium: 3.8 mEq/L (ref 3.7–5.3)
Sodium: 140 mEq/L (ref 137–147)

## 2014-07-16 LAB — GLUCOSE, CAPILLARY
Glucose-Capillary: 144 mg/dL — ABNORMAL HIGH (ref 70–99)
Glucose-Capillary: 114 mg/dL — ABNORMAL HIGH (ref 70–99)
Glucose-Capillary: 125 mg/dL — ABNORMAL HIGH (ref 70–99)
Glucose-Capillary: 168 mg/dL — ABNORMAL HIGH (ref 70–99)

## 2014-07-16 MED ORDER — LORAZEPAM 2 MG/ML IJ SOLN
1.0000 mg | Freq: Once | INTRAMUSCULAR | Status: AC
  Administered 2014-07-16: 1 mg via INTRAVENOUS
  Filled 2014-07-16: qty 1

## 2014-07-16 NOTE — Progress Notes (Signed)
Utilization review completed.  

## 2014-07-16 NOTE — Care Management Note (Signed)
    Page 1 of 1   07/17/2014     3:16:28 PM CARE MANAGEMENT NOTE 07/17/2014  Patient:  [name], [name]   Account Number:  [id number]  Date Initiated:  07/16/2014  Documentation initiated by:  Tomi Bamberger  Subjective/Objective Assessment:   dx alcohol withdrawal  admit- lives alone.     Action/Plan:   psych eval- rec out pt follow up.   Anticipated DC Date:  07/17/2014   Anticipated DC Plan:  HOME/SELF CARE  In-house referral  Clinical Social Worker      DC Planning Services  CM consult      Choice offered to / List presented to:             Status of service:  Completed, signed off Medicare Important Message given?  NO (If response is "NO", the following Medicare IM given date fields will be blank) Date Medicare IM given:   Medicare IM given by:   Date Additional Medicare IM given:   Additional Medicare IM given by:    Discharge Disposition:  HOME/SELF CARE  Per UR Regulation:  Reviewed for med. necessity/level of care/duration of stay  If discussed at [place] of Stay Meetings, dates discussed:    Comments:

## 2014-07-16 NOTE — Progress Notes (Signed)
[facility] PROGRESS NOTE Assessment/Plan: Alcohol intoxication and withdrawal  -Continue CIWA protocol with Librium, score low. -Continue thiamine, multivitamin, folic acid  -Psychiatry consult rec outpatient psyq. -Continue seizure precautions  - mildly nauseated.  Hypotension  -Resolved  -Patient was given approximately 2 L of fluid in the emergency department, and IVF, BP has responded well   Acute kidney injury  -Patient baseline creatinine is approximately 0.5, upon admission was 1.32  -Likely secondary to dehydration   Hypertension  -Will restart home BP meds   Diabetes mellitus type 2  -Will Januvia at this time.  -Patient be placed on a liquid diet.  -Will place patient on insulin sliding scale hold CBG monitoring.   Anion gap Metabolic acidosis  -Secondary to alcohol  -Pending BMP this morning.   Hypovolemic Hyponatremia and hypochloremia  -Secondary dehydration secondary to GI losses  -Was placed on IVF, pending labs this morning.   Nausea and vomiting  -mildly nauseated. -Initially placed on liquid diet, will advance as tolerated  -Continue antiemetics as needed.  Depression  -Continue Lexapro   Code Status: full Family Communication: none  Disposition Plan: inpatient   Consultants:  psyq.  Procedures:  none  Antibiotics:  None  HPI/Subjective: Mildly nauseated.  Objective: Filed Vitals:   07/15/14 1700 07/15/14 2025 07/16/14 0022 07/16/14 0444  BP: 139/66 156/71 135/64 136/77  Pulse: 74 90 85 75  Temp:  98.4 F (36.9 C)  98.1 F (36.7 C)  TempSrc:  Oral  Oral  Resp: 16 16  16   Height:      Weight:      SpO2: 91% 96%  97%    Intake/Output Summary (Last 24 hours) at 07/16/14 1018 Last data filed at 07/15/14 1700  Gross per 24 hour  Intake    480 ml  Output    300 ml  Net    180 ml   Filed Weights   07/14/14 1349 07/14/14 1755  Weight: 76.204 kg (168 lb) 76.7 kg (169 lb 1.5 oz)    Exam:  General: Alert,  awake, oriented x3, in no acute distress.  HEENT: No bruits, no goiter.  Heart: Regular rate and rhythm. Lungs: Good air movement, clear Abdomen: Soft, nontender. Neuro: Grossly intact, nonfocal.   Data Reviewed: Basic Metabolic Panel:  Recent Labs Lab 07/14/14 1427 07/15/14 0945 07/16/14 0530  NA 136* 136* 140  K 4.6 4.6 3.8  CL 90* 98 103  CO2 16* 22 24  GLUCOSE 118* 146* 133*  BUN 19 15 13   CREATININE 1.32* 0.58 0.54  CALCIUM 8.5 7.3* 7.6*  MG 2.4  --   --   PHOS 4.8*  --   --    Liver Function Tests:  Recent Labs Lab 07/14/14 1427  AST 37  ALT 20  ALKPHOS 92  BILITOT 0.7  PROT 7.8  ALBUMIN 4.3   No results found for this basename: LIPASE, AMYLASE,  in the last 168 hours No results found for this basename: AMMONIA,  in the last 168 hours CBC:  Recent Labs Lab 07/14/14 1427 07/16/14 0530  WBC 6.2 2.9*  HGB 12.9 11.3*  HCT 38.8 34.6*  MCV 97.2 98.3  PLT 216 111*   Cardiac Enzymes: No results found for this basename: CKTOTAL, CKMB, CKMBINDEX, TROPONINI,  in the last 168 hours BNP (last 3 results) No results found for this basename: PROBNP,  in the last 8760 hours CBG:  Recent Labs Lab 07/15/14 1210 07/15/14 1544 07/15/14 1752 07/15/14 2315 07/16/14 0756  GLUCAP 115* 151* 122* 167* 144*    Recent Results (from the past 240 hour(s))  MRSA PCR SCREENING     Status: None   Collection Time    07/14/14  6:04 PM      Result Value Ref Range Status   MRSA by PCR NEGATIVE  NEGATIVE Final   Comment:            The GeneXpert MRSA Assay (FDA     approved for NASAL specimens     only), is one component of a     comprehensive MRSA colonization     surveillance program. It is not     intended to diagnose MRSA     infection nor to guide or     monitor treatment for     MRSA infections.     Studies: Dg Chest Portable 1 View  07/14/2014   CLINICAL DATA:  Alcohol withdrawal, delirium tremens  EXAM: PORTABLE CHEST - 1 VIEW  COMPARISON:  06/20/2012   FINDINGS: The heart size and mediastinal contours are within normal limits. Both lungs are clear but hypoaerated. The visualized skeletal structures are unremarkable.  IMPRESSION: No active disease.   Electronically Signed   By: Conchita Paris M.D.   On: 07/14/2014 16:54    Scheduled Meds: . chlordiazePOXIDE  25 mg Oral TID   Followed by  . chlordiazePOXIDE  25 mg Oral BH-qamhs   Followed by  . [START ON 07/17/2014] chlordiazePOXIDE  25 mg Oral Daily  . enoxaparin (LOVENOX) injection  40 mg Subcutaneous Q24H  . escitalopram  20 mg Oral Daily  . ezetimibe  10 mg Oral Daily  . folic acid  1 mg Oral Daily  . insulin aspart  0-15 Units Subcutaneous TID WC  . multivitamin with minerals  1 tablet Oral Daily  . thiamine  100 mg Oral Daily   Continuous Infusions:    Charlynne Cousins  [facility] Pager [phone]. If 8PM-8AM, please contact night-coverage at [email or web address], password [facility] 07/16/2014, 10:18 AM  LOS: 2 days      **Disclaimer: This note may have been dictated with voice recognition software. Similar sounding words can inadvertently be transcribed and this note may contain transcription errors which may not have been corrected upon publication of note.**

## 2014-07-17 LAB — GLUCOSE, CAPILLARY
Glucose-Capillary: 139 mg/dL — ABNORMAL HIGH (ref 70–99)
Glucose-Capillary: 113 mg/dL — ABNORMAL HIGH (ref 70–99)
Glucose-Capillary: 133 mg/dL — ABNORMAL HIGH (ref 70–99)

## 2014-07-17 MED ORDER — LORAZEPAM 0.5 MG PO TABS: 0.5000 mg | ORAL_TABLET | Freq: Three times a day (TID) | ORAL | Status: DC | PRN

## 2014-07-17 MED ORDER — FOLIC ACID 1 MG PO TABS: 1.0000 mg | ORAL_TABLET | Freq: Every day | ORAL | Status: AC

## 2014-07-17 MED ORDER — THIAMINE HCL 100 MG PO TABS: 100.0000 mg | ORAL_TABLET | Freq: Every day | ORAL | Status: AC

## 2014-07-17 NOTE — Discharge Summary (Signed)
PATIENT DETAILS Name: [name] Age: 68 y.o. Sex: female Date of Birth: 1946/10/13 MRN: [id number]. Admit Date: 07/14/2014 Admitting Physician: Cristal Ford, DO PCP:Pcp Not In System  Recommendations for Outpatient Follow-up:  1. Follow-up with PCP  2. Establish outpatient psychiatry care  PRIMARY DISCHARGE DIAGNOSIS: Alcohol intoxication and withdrawal  Hypotension Episode HTN Acute kidney injury  Diabetes mellitus type 2               Anion gap Metabolic acidosis  Hypovolemic Hyponatremia and hypochloremia  Nausea and vomiting  Depression       PAST MEDICAL HISTORY: Past Medical History  Diagnosis Date  . Alcohol abuse   . Diabetes mellitus   . Hypertension   . Fibromyalgia   . Depression   . Carotid artery stenosis    DISCHARGE MEDICATIONS:   Medication List         b complex vitamins tablet  Take 1 tablet by mouth daily.     diphenoxylate-atropine 2.5-0.025 MG per tablet  Commonly known as:  LOMOTIL  Take 1 tablet by mouth 4 (four) times daily as needed for diarrhea or loose stools.     escitalopram 20 MG tablet  Commonly known as:  LEXAPRO  Take 20 mg by mouth daily.     ezetimibe 10 MG tablet  Commonly known as:  ZETIA  Take 1 tablet (10 mg total) by mouth daily.     folic acid 1 MG tablet  Commonly known as:  FOLVITE  Take 1 tablet (1 mg total) by mouth daily.     lisinopril 5 MG tablet  Commonly known as:  PRINIVIL,ZESTRIL  Take 1 tablet (5 mg total) by mouth daily.     LORazepam 0.5 MG tablet  Commonly known as:  ATIVAN  Take 1 tablet (0.5 mg total) by mouth every 8 (eight) hours as needed for anxiety.     nebivolol 5 MG tablet  Commonly known as:  BYSTOLIC  Take 1 tablet (5 mg total) by mouth daily.     sitaGLIPtin 100 MG tablet  Commonly known as:  JANUVIA  Take 1 tablet (100 mg total) by mouth daily.     thiamine 100 MG tablet  Take 1 tablet (100 mg total) by mouth daily.       ALLERGIES:   Allergies  Allergen  Reactions  . Latex Other (See Comments)    Break outs  . Lipitor [Atorvastatin Calcium] Other (See Comments)    Muscle aches  . Codeine Rash    And nausea   . Pentazocine Lactate Rash  . Sulfonamide Derivatives Rash   BRIEF HPI:  See H&P, Labs, Consult and Test reports for all details in brief, patient was admitted for hypotension and alcohol withdrawal.   CONSULTATIONS:   psychiatry  PERTINENT RADIOLOGIC STUDIES: Dg Chest Portable 1 View 07/14/2014   CLINICAL DATA:  Alcohol withdrawal, delirium tremens  EXAM: PORTABLE CHEST - 1 VIEW  COMPARISON:  06/20/2012  FINDINGS: The heart size and mediastinal contours are within normal limits. Both lungs are clear but hypoaerated. The visualized skeletal structures are unremarkable.  IMPRESSION: No active disease.   Electronically Signed   By: Conchita Paris M.D.   On: 07/14/2014 16:54   PERTINENT LAB RESULTS: CBC:  Recent Labs  07/14/14 1427 07/16/14 0530  WBC 6.2 2.9*  HGB 12.9 11.3*  HCT 38.8 34.6*  PLT 216 111*   CMET CMP     Component Value Date/Time   NA 140 07/16/2014 0530  K 3.8 07/16/2014 0530   CL 103 07/16/2014 0530   CO2 24 07/16/2014 0530   GLUCOSE 133* 07/16/2014 0530   GLUCOSE 123* 11/17/2006 1240   BUN 13 07/16/2014 0530   CREATININE 0.54 07/16/2014 0530   CREATININE 0.80 04/18/2014 1039   CALCIUM 7.6* 07/16/2014 0530   PROT 7.8 07/14/2014 1427   ALBUMIN 4.3 07/14/2014 1427   AST 37 07/14/2014 1427   ALT 20 07/14/2014 1427   ALKPHOS 92 07/14/2014 1427   BILITOT 0.7 07/14/2014 1427   GFRNONAA >90 07/16/2014 0530   GFRAA >90 07/16/2014 0530   GFR Estimated Creatinine Clearance: 67.8 ml/min (by C-G formula based on Cr of 0.54).  Recent Labs  07/14/14 1427  HGBA1C 7.1*   Microbiology: Recent Results (from the past 240 hour(s))  MRSA PCR SCREENING     Status: None   Collection Time    07/14/14  6:04 PM      Result Value Ref Range Status   MRSA by PCR NEGATIVE  NEGATIVE Final   Comment:            The GeneXpert MRSA Assay (FDA      approved for NASAL specimens     only), is one component of a     comprehensive MRSA colonization     surveillance program. It is not     intended to diagnose MRSA     infection nor to guide or     monitor treatment for     MRSA infections.   BRIEF HOSPITAL COURSE:   Brief Summary Patient is a 68 year-old female who was admitted for hypotension and alcohol withdrawal. She was placed on CIWA protocol with Librium and given thiamine, multivitamin and folic acid. Psychiatry was consulted who recommended outpatient follow-up. After receiving IVF, hypotension resolved and AKI, anion gap metabolic acidosis, hyponatremia and hypochloremia corrected. Patient has tolerated diet advancement, nausea and vomiting have been controlled on antiemetics and she wants to be discharged home today.   Hospital course by problem list Alcohol intoxication and withdrawal  -Resolved -Continue thiamine, multivitamin, folic acid  -Psychiatry consulted-no need for inpatient psych transfer, ok to discharge home  Hypotension Episode -Resolved after given IVF -suspect secondary to hypovolumia  HTN -Continue to manage HTN with home regimen: lebivolol and lisinopril  Acute kidney injury  -Resolved -Likely secondary to dehydration   Diabetes mellitus type 2  -D/C on Januvia                    Anion gap Metabolic acidosis  -Resolved, suspect secondary to alcohol   Hypovolemic Hyponatremia and hypochloremia  -Secondary dehydration secondary to GI losses  -Was placed on IVF, Resolved  Nausea and vomiting  -Tolerated diet advancement  -Antiemetics for control   Depression  -Continue Lexapro   TODAY-DAY OF DISCHARGE:  Subjective:   [name] today has no headache,no chest pain, abdominal pain, no new weakness tingling or numbness, feels much better wants to go home today.   Objective:   Blood pressure 161/78, pulse 76, temperature 97.9 F (36.6 C), temperature source Oral, resp. rate 20,  height 5\' 3"  (1.6 m), weight 76.7 kg (169 lb 1.5 oz), SpO2 99.00%.  Intake/Output Summary (Last 24 hours) at 07/17/14 1154 Last data filed at 07/17/14 0844  Gross per 24 hour  Intake    720 ml  Output      0 ml  Net    720 ml   Filed Weights   07/14/14 1349 07/14/14  1755  Weight: 76.204 kg (168 lb) 76.7 kg (169 lb 1.5 oz)    Exam Awake but sleepy this morning, Alert during conversations, Oriented *3, No new F.N deficits, Normal affect, normocephalic, atraumatic Supple Neck,No JVD, No cervical lymphadenopathy appriciated.  Symmetrical Chest wall movement, Good air movement bilaterally, CTAB RRR,No Gallops,Rubs or new Murmurs, No Parasternal Heave +ve B.Sounds, Abd Soft, Non tender, No organomegaly appriciated, No rebound -guarding or rigidity. No Cyanosis, Clubbing or edema, No new Rash or bruise  DISCHARGE CONDITION: Stable  DISPOSITION: Home  DISCHARGE INSTRUCTIONS:    Activity:  As tolerated   Diet recommendation: Diabetic Diet, Heart Healthy diet  Total Time spent on discharge equals 45 minutes.  Signed: Audelia Acton PA-S 07/17/2014 11:54 AM  **Disclaimer: This note may have been dictated with voice recognition software. Similar sounding words can inadvertently be transcribed and this note may contain transcription errors which may not have been corrected upon publication of note.**  Attending Patient was seen, examined,treatment plan was discussed with the Physician extender. I have directly reviewed the clinical findings, lab, imaging studies and management of this patient in detail. I have made the necessary changes to the above noted documentation, and agree with the documentation, as recorded by the Physician extender.  Nena Alexander MD [facility].

## 2014-07-17 NOTE — Progress Notes (Signed)
Patient was discharged home by MD order; discharged instructions  review and give to patient with care notes and prescriptions; IV DIC; skin intact; patient will be escorted to the car by a volunteer via wheelchair.  

## 2014-07-18 ENCOUNTER — Telehealth: Payer: Self-pay | Admitting: *Deleted

## 2014-07-18 ENCOUNTER — Telehealth: Payer: Self-pay | Admitting: Internal Medicine

## 2014-07-18 NOTE — Telephone Encounter (Signed)
Called pt and lvm advising her per Dr Arman Filter note. Advised pt if she had any questions to call our office.

## 2014-07-18 NOTE — Telephone Encounter (Signed)
Please read note below and advise.  

## 2014-07-18 NOTE — Telephone Encounter (Signed)
Patient asked if Dr Cruzita Lederer could send the order for the A1C to [facility], to get her lab work done.

## 2014-07-18 NOTE — Telephone Encounter (Signed)
Opened in error

## 2014-07-18 NOTE — Telephone Encounter (Signed)
No, she just had one: Component     Latest Ref Rng 07/14/2014  Hemoglobin A1C     <5.7 % 7.1 (H)  Greatly decreased!

## 2014-07-19 ENCOUNTER — Ambulatory Visit: Payer: 59 | Attending: Internal Medicine | Admitting: Internal Medicine

## 2014-07-19 ENCOUNTER — Encounter: Payer: Self-pay | Admitting: Internal Medicine

## 2014-07-19 VITALS — BP 146/90 | HR 82 | Temp 98.7°F | Resp 16 | Ht 63.0 in | Wt 167.0 lb

## 2014-07-19 DIAGNOSIS — R195 Other fecal abnormalities: Secondary | ICD-10-CM

## 2014-07-19 DIAGNOSIS — I10 Essential (primary) hypertension: Secondary | ICD-10-CM

## 2014-07-19 DIAGNOSIS — F411 Generalized anxiety disorder: Secondary | ICD-10-CM

## 2014-07-19 DIAGNOSIS — E1110 Type 2 diabetes mellitus with ketoacidosis without coma: Secondary | ICD-10-CM

## 2014-07-19 DIAGNOSIS — R197 Diarrhea, unspecified: Secondary | ICD-10-CM

## 2014-07-19 DIAGNOSIS — E1310 Other specified diabetes mellitus with ketoacidosis without coma: Secondary | ICD-10-CM

## 2014-07-19 LAB — GLUCOSE, POCT (MANUAL RESULT ENTRY): POC Glucose: 194 mg/dl — AB (ref 70–99)

## 2014-07-19 MED ORDER — DIPHENOXYLATE-ATROPINE 2.5-0.025 MG PO TABS: 1.0000 | ORAL_TABLET | Freq: Four times a day (QID) | ORAL | Status: DC | PRN

## 2014-07-19 NOTE — Patient Instructions (Addendum)
Diabetes Mellitus and Food It is important for you to manage your blood sugar (glucose) level. Your blood glucose level can be greatly affected by what you eat. Eating healthier foods in the appropriate amounts throughout the day at about the same time each day will help you control your blood glucose level. It can also help slow or prevent worsening of your diabetes mellitus. Healthy eating may even help you improve the level of your blood pressure and reach or maintain a healthy weight.  HOW CAN FOOD AFFECT ME? Carbohydrates Carbohydrates affect your blood glucose level more than any other type of food. Your dietitian will help you determine how many carbohydrates to eat at each meal and teach you how to count carbohydrates. Counting carbohydrates is important to keep your blood glucose at a healthy level, especially if you are using insulin or taking certain medicines for diabetes mellitus. Alcohol Alcohol can cause sudden decreases in blood glucose (hypoglycemia), especially if you use insulin or take certain medicines for diabetes mellitus. Hypoglycemia can be a life-threatening condition. Symptoms of hypoglycemia (sleepiness, dizziness, and disorientation) are similar to symptoms of having too much alcohol.  If your health care provider has given you approval to drink alcohol, do so in moderation and use the following guidelines:  Women should not have more than one drink per day, and men should not have more than two drinks per day. One drink is equal to:  12 oz of beer.  5 oz of wine.  1 oz of hard liquor.  Do not drink on an empty stomach.  Keep yourself hydrated. Have water, diet soda, or unsweetened iced tea.  Regular soda, juice, and other mixers might contain a lot of carbohydrates and should be counted. WHAT FOODS ARE NOT RECOMMENDED? As you make food choices, it is important to remember that all foods are not the same. Some foods have fewer nutrients per serving than other  foods, even though they might have the same number of calories or carbohydrates. It is difficult to get your body what it needs when you eat foods with fewer nutrients. Examples of foods that you should avoid that are high in calories and carbohydrates but low in nutrients include:  Trans fats (most processed foods list trans fats on the Nutrition Facts label).  Regular soda.  Juice.  Candy.  Sweets, such as cake, pie, doughnuts, and cookies.  Fried foods. WHAT FOODS CAN I EAT? Have nutrient-rich foods, which will nourish your body and keep you healthy. The food you should eat also will depend on several factors, including:  The calories you need.  The medicines you take.  Your weight.  Your blood glucose level.  Your blood pressure level.  Your cholesterol level. You also should eat a variety of foods, including:  Protein, such as meat, poultry, fish, tofu, nuts, and seeds (lean animal proteins are best).  Fruits.  Vegetables.  Dairy products, such as milk, cheese, and yogurt (low fat is best).  Breads, grains, pasta, cereal, rice, and beans.  Fats such as olive oil, trans fat-free margarine, canola oil, avocado, and olives. DOES EVERYONE WITH DIABETES MELLITUS HAVE THE SAME MEAL PLAN? Because every person with diabetes mellitus is different, there is not one meal plan that works for everyone. It is very important that you meet with a dietitian who will help you create a meal plan that is just right for you. Document Released: 08/26/2005 Document Revised: 12/04/2013 Document Reviewed: 10/26/2013 ExitCare Patient Information 2015 [employer]. This   information is not intended to replace advice given to you by your health care provider. Make sure you discuss any questions you have with your health care provider. DASH Eating Plan DASH stands for "Dietary Approaches to Stop Hypertension." The DASH eating plan is a healthy eating plan that has been shown to reduce high  blood pressure (hypertension). Additional health benefits may include reducing the risk of type 2 diabetes mellitus, heart disease, and stroke. The DASH eating plan may also help with weight loss. WHAT DO I NEED TO KNOW ABOUT THE DASH EATING PLAN? For the DASH eating plan, you will follow these general guidelines:  Choose foods with a percent daily value for sodium of less than 5% (as listed on the food label).  Use salt-free seasonings or herbs instead of table salt or sea salt.  Check with your health care provider or pharmacist before using salt substitutes.  Eat lower-sodium products, often labeled as "lower sodium" or "no salt added."  Eat fresh foods.  Eat more vegetables, fruits, and low-fat dairy products.  Choose whole grains. Look for the word "whole" as the first word in the ingredient list.  Choose fish and skinless chicken or turkey more often than red meat. Limit fish, poultry, and meat to 6 oz (170 g) each day.  Limit sweets, desserts, sugars, and sugary drinks.  Choose heart-healthy fats.  Limit cheese to 1 oz (28 g) per day.  Eat more home-cooked food and less restaurant, buffet, and fast food.  Limit fried foods.  Cook foods using methods other than frying.  Limit canned vegetables. If you do use them, rinse them well to decrease the sodium.  When eating at a restaurant, ask that your food be prepared with less salt, or no salt if possible. WHAT FOODS CAN I EAT? Seek help from a dietitian for individual calorie needs. Grains Whole grain or whole wheat bread. Brown rice. Whole grain or whole wheat pasta. Quinoa, bulgur, and whole grain cereals. Low-sodium cereals. Corn or whole wheat flour tortillas. Whole grain cornbread. Whole grain crackers. Low-sodium crackers. Vegetables Fresh or frozen vegetables (raw, steamed, roasted, or grilled). Low-sodium or reduced-sodium tomato and vegetable juices. Low-sodium or reduced-sodium tomato sauce and paste. Low-sodium  or reduced-sodium canned vegetables.  Fruits All fresh, canned (in natural juice), or frozen fruits. Meat and Other Protein Products Ground beef (85% or leaner), grass-fed beef, or beef trimmed of fat. Skinless chicken or turkey. Ground chicken or turkey. Pork trimmed of fat. All fish and seafood. Eggs. Dried beans, peas, or lentils. Unsalted nuts and seeds. Unsalted canned beans. Dairy Low-fat dairy products, such as skim or 1% milk, 2% or reduced-fat cheeses, low-fat ricotta or cottage cheese, or plain low-fat yogurt. Low-sodium or reduced-sodium cheeses. Fats and Oils Tub margarines without trans fats. Light or reduced-fat mayonnaise and salad dressings (reduced sodium). Avocado. Safflower, olive, or canola oils. Natural peanut or almond butter. Other Unsalted popcorn and pretzels. The items listed above may not be a complete list of recommended foods or beverages. Contact your dietitian for more options. WHAT FOODS ARE NOT RECOMMENDED? Grains White bread. White pasta. White rice. Refined cornbread. Bagels and croissants. Crackers that contain trans fat. Vegetables Creamed or fried vegetables. Vegetables in a cheese sauce. Regular canned vegetables. Regular canned tomato sauce and paste. Regular tomato and vegetable juices. Fruits Dried fruits. Canned fruit in light or heavy syrup. Fruit juice. Meat and Other Protein Products Fatty cuts of meat. Ribs, chicken wings, bacon, sausage, bologna, salami, chitterlings, fatback, hot   dogs, bratwurst, and packaged luncheon meats. Salted nuts and seeds. Canned beans with salt. Dairy Whole or 2% milk, cream, half-and-half, and cream cheese. Whole-fat or sweetened yogurt. Full-fat cheeses or blue cheese. Nondairy creamers and whipped toppings. Processed cheese, cheese spreads, or cheese curds. Condiments Onion and garlic salt, seasoned salt, table salt, and sea salt. Canned and packaged gravies. Worcestershire sauce. Tartar sauce. Barbecue sauce.  Teriyaki sauce. Soy sauce, including reduced sodium. Steak sauce. Fish sauce. Oyster sauce. Cocktail sauce. Horseradish. Ketchup and mustard. Meat flavorings and tenderizers. Bouillon cubes. Hot sauce. Tabasco sauce. Marinades. Taco seasonings. Relishes. Fats and Oils Butter, stick margarine, lard, shortening, ghee, and bacon fat. Coconut, palm kernel, or palm oils. Regular salad dressings. Other Pickles and olives. Salted popcorn and pretzels. The items listed above may not be a complete list of foods and beverages to avoid. Contact your dietitian for more information. WHERE CAN I FIND MORE INFORMATION? National Heart, Lung, and Blood Institute: www.nhlbi.nih.gov/health/health-topics/topics/dash/ Document Released: 11/18/2011 Document Revised: 04/15/2014 Document Reviewed: 10/03/2013 ExitCare Patient Information 2015 [employer]. This information is not intended to replace advice given to you by your health care provider. Make sure you discuss any questions you have with your health care provider.  

## 2014-07-19 NOTE — Progress Notes (Signed)
Patient here to follow up on her diabetes and hypertension.  She needs an ativan refill today.

## 2014-07-19 NOTE — Progress Notes (Signed)
MRN: [id number] Name: [name]  Sex: female Age: 68 y.o. DOB: 08-20-1946  Allergies: Latex; Lipitor; Codeine; Pentazocine lactate; and Sulfonamide derivatives  Chief Complaint  Patient presents with  . Diabetes  . Hypertension    HPI: Patient is 68 y.o. female who has to of diabetes hypertension anxiety, recently hospitalized with alcohol detox, she  was also prescribed lorazepam 2 days ago patient today was requesting refill on the medication, I have advised patient to follow with psychiatrist since she recently got the medication it cannot be prescribed again that soon, she denies any SI or HI currently taking Lexapro for anxiety/depression, she also follows up with endocrinologist and her A1c is much improved currently taking Januvia, also history of hypertension and is on lisinopril and bystolic and her blood pressure is borderline elevated., She reported to have on and off loose bowel movements and requesting refill on Lomotil the medication which helps her with the symptoms. Past Medical History  Diagnosis Date  . Alcohol abuse   . Diabetes mellitus   . Hypertension   . Fibromyalgia   . Depression   . Carotid artery stenosis     Past Surgical History  Procedure Laterality Date  . Cesarean section    . Tonsillectomy    . Bunionectomy        Medication List       This list is accurate as of: 07/19/14 12:30 PM.  Always use your most recent med list.               b complex vitamins tablet  Take 1 tablet by mouth daily.     diphenoxylate-atropine 2.5-0.025 MG per tablet  Commonly known as:  LOMOTIL  Take 1 tablet by mouth 4 (four) times daily as needed for diarrhea or loose stools.     escitalopram 20 MG tablet  Commonly known as:  LEXAPRO  Take 20 mg by mouth daily.     ezetimibe 10 MG tablet  Commonly known as:  ZETIA  Take 1 tablet (10 mg total) by mouth daily.     folic acid 1 MG tablet  Commonly known as:  FOLVITE  Take 1 tablet (1 mg total)  by mouth daily.     lisinopril 5 MG tablet  Commonly known as:  PRINIVIL,ZESTRIL  Take 1 tablet (5 mg total) by mouth daily.     LORazepam 0.5 MG tablet  Commonly known as:  ATIVAN  Take 1 tablet (0.5 mg total) by mouth every 8 (eight) hours as needed for anxiety.     nebivolol 5 MG tablet  Commonly known as:  BYSTOLIC  Take 1 tablet (5 mg total) by mouth daily.     sitaGLIPtin 100 MG tablet  Commonly known as:  JANUVIA  Take 1 tablet (100 mg total) by mouth daily.     thiamine 100 MG tablet  Take 1 tablet (100 mg total) by mouth daily.        Meds ordered this encounter  Medications  . diphenoxylate-atropine (LOMOTIL) 2.5-0.025 MG per tablet    Sig: Take 1 tablet by mouth 4 (four) times daily as needed for diarrhea or loose stools.    Dispense:  30 tablet    Refill:  1    Immunization History  Administered Date(s) Administered  . Influenza Whole 01/08/2004  . Influenza,inj,Quad PF,36+ Mos 09/12/2013  . Tdap 11/28/2013    Family History  Problem Relation Age of Onset  . Cancer Mother   . Heart  disease Father   . Heart disease Sister     History  Substance Use Topics  . Smoking status: Former Smoker    Quit date: 03/11/2010  . Smokeless tobacco: Never Used  . Alcohol Use: Yes     Comment: 4-5 bottles of wine per day    Review of Systems   As noted in HPI  Filed Vitals:   07/19/14 1203  BP: 146/90  Pulse: 82  Temp: 98.7 F (37.1 C)  Resp: 16    Physical Exam  Physical Exam  Constitutional: No distress.  Eyes: EOM are normal. Pupils are equal, round, and reactive to light.  Cardiovascular: Normal rate and regular rhythm.   Pulmonary/Chest: Breath sounds normal. No respiratory distress. She has no wheezes. She has no rales.  Musculoskeletal: She exhibits no edema.    CBC    Component Value Date/Time   WBC 2.9* 07/16/2014 0530   RBC 3.52* 07/16/2014 0530   HGB 11.3* 07/16/2014 0530   HCT 34.6* 07/16/2014 0530   PLT 111* 07/16/2014 0530   MCV  98.3 07/16/2014 0530   LYMPHSABS 3.2 06/08/2014 1524   MONOABS 0.3 06/08/2014 1524   EOSABS 0.0 06/08/2014 1524   BASOSABS 0.0 06/08/2014 1524    CMP     Component Value Date/Time   NA 140 07/16/2014 0530   K 3.8 07/16/2014 0530   CL 103 07/16/2014 0530   CO2 24 07/16/2014 0530   GLUCOSE 133* 07/16/2014 0530   GLUCOSE 123* 11/17/2006 1240   BUN 13 07/16/2014 0530   CREATININE 0.54 07/16/2014 0530   CREATININE 0.80 04/18/2014 1039   CALCIUM 7.6* 07/16/2014 0530   PROT 7.8 07/14/2014 1427   ALBUMIN 4.3 07/14/2014 1427   AST 37 07/14/2014 1427   ALT 20 07/14/2014 1427   ALKPHOS 92 07/14/2014 1427   BILITOT 0.7 07/14/2014 1427   GFRNONAA >90 07/16/2014 0530   GFRAA >90 07/16/2014 0530    Lab Results  Component Value Date/Time   CHOL 202* 04/18/2014 10:39 AM    No components found with this basename: hga1c    Lab Results  Component Value Date/Time   AST 37 07/14/2014  2:27 PM    Assessment and Plan  DM (diabetes mellitus) type 2, uncontrolled,- Plan:  Results for orders placed in visit on 07/19/14  GLUCOSE, POCT (MANUAL RESULT ENTRY)      Result Value Ref Range   POC Glucose 194 (*) 70 - 99 mg/dl    as per patient she has already eaten today her blood sugar is high, her last A1c was 7.1%, she's advised for diabetes meal planning continue Januvia.  Unspecified essential hypertension Blood pressure is borderline elevated, advise patient for DASH diet also she is on the bystolic and lsinopril.  Anxiety state, unspecified Patient is on Lexapro and recently got the prescription for lorazepam, she is encouraged to follow with her psychiatrist.  Loose bowel movement - Plan: diphenoxylate-atropine (LOMOTIL) 2.5-0.025 MG per tablet   Return in about 3 months (around 10/19/2014).  [name], MD

## 2014-07-22 DIAGNOSIS — IMO0002 Reserved for concepts with insufficient information to code with codable children: Secondary | ICD-10-CM | POA: Insufficient documentation

## 2014-07-22 DIAGNOSIS — E1139 Type 2 diabetes mellitus with other diabetic ophthalmic complication: Secondary | ICD-10-CM | POA: Insufficient documentation

## 2014-07-22 DIAGNOSIS — E1165 Type 2 diabetes mellitus with hyperglycemia: Secondary | ICD-10-CM

## 2014-08-21 ENCOUNTER — Ambulatory Visit: Payer: Self-pay | Admitting: Internal Medicine

## 2014-08-21 DIAGNOSIS — Z0289 Encounter for other administrative examinations: Secondary | ICD-10-CM

## 2014-08-27 ENCOUNTER — Inpatient Hospital Stay (HOSPITAL_COMMUNITY)
Admission: EM | Admit: 2014-08-27 | Discharge: 2014-09-05 | DRG: 896 | Disposition: A | Discharge status: Home Health | Payer: 59 | Attending: Internal Medicine | Admitting: Internal Medicine

## 2014-08-27 ENCOUNTER — Encounter (HOSPITAL_COMMUNITY): Payer: Self-pay | Admitting: Emergency Medicine

## 2014-08-27 DIAGNOSIS — E871 Hypo-osmolality and hyponatremia: Secondary | ICD-10-CM | POA: Diagnosis present

## 2014-08-27 DIAGNOSIS — E119 Type 2 diabetes mellitus without complications: Secondary | ICD-10-CM | POA: Diagnosis present

## 2014-08-27 DIAGNOSIS — D72819 Decreased white blood cell count, unspecified: Secondary | ICD-10-CM | POA: Diagnosis present

## 2014-08-27 DIAGNOSIS — F1010 Alcohol abuse, uncomplicated: Secondary | ICD-10-CM | POA: Diagnosis not present

## 2014-08-27 DIAGNOSIS — R5381 Other malaise: Secondary | ICD-10-CM | POA: Diagnosis present

## 2014-08-27 DIAGNOSIS — I10 Essential (primary) hypertension: Secondary | ICD-10-CM | POA: Diagnosis present

## 2014-08-27 DIAGNOSIS — E872 Acidosis, unspecified: Secondary | ICD-10-CM | POA: Diagnosis present

## 2014-08-27 DIAGNOSIS — D696 Thrombocytopenia, unspecified: Secondary | ICD-10-CM | POA: Diagnosis present

## 2014-08-27 DIAGNOSIS — K759 Inflammatory liver disease, unspecified: Secondary | ICD-10-CM

## 2014-08-27 DIAGNOSIS — I959 Hypotension, unspecified: Secondary | ICD-10-CM | POA: Diagnosis present

## 2014-08-27 DIAGNOSIS — F10231 Alcohol dependence with withdrawal delirium: Secondary | ICD-10-CM

## 2014-08-27 DIAGNOSIS — R7989 Other specified abnormal findings of blood chemistry: Secondary | ICD-10-CM | POA: Diagnosis present

## 2014-08-27 DIAGNOSIS — IMO0001 Reserved for inherently not codable concepts without codable children: Secondary | ICD-10-CM | POA: Diagnosis present

## 2014-08-27 DIAGNOSIS — E43 Unspecified severe protein-calorie malnutrition: Secondary | ICD-10-CM | POA: Diagnosis present

## 2014-08-27 DIAGNOSIS — Z87891 Personal history of nicotine dependence: Secondary | ICD-10-CM

## 2014-08-27 DIAGNOSIS — F10929 Alcohol use, unspecified with intoxication, unspecified: Secondary | ICD-10-CM | POA: Diagnosis present

## 2014-08-27 DIAGNOSIS — Z8249 Family history of ischemic heart disease and other diseases of the circulatory system: Secondary | ICD-10-CM

## 2014-08-27 DIAGNOSIS — Z79899 Other long term (current) drug therapy: Secondary | ICD-10-CM

## 2014-08-27 DIAGNOSIS — K7010 Alcoholic hepatitis without ascites: Secondary | ICD-10-CM | POA: Diagnosis present

## 2014-08-27 DIAGNOSIS — E876 Hypokalemia: Secondary | ICD-10-CM | POA: Diagnosis present

## 2014-08-27 DIAGNOSIS — F411 Generalized anxiety disorder: Secondary | ICD-10-CM | POA: Diagnosis present

## 2014-08-27 DIAGNOSIS — F10232 Alcohol dependence with withdrawal with perceptual disturbance: Secondary | ICD-10-CM

## 2014-08-27 DIAGNOSIS — D638 Anemia in other chronic diseases classified elsewhere: Secondary | ICD-10-CM | POA: Diagnosis present

## 2014-08-27 DIAGNOSIS — Z882 Allergy status to sulfonamides status: Secondary | ICD-10-CM

## 2014-08-27 DIAGNOSIS — F10931 Alcohol use, unspecified with withdrawal delirium: Secondary | ICD-10-CM | POA: Diagnosis not present

## 2014-08-27 DIAGNOSIS — F10239 Alcohol dependence with withdrawal, unspecified: Secondary | ICD-10-CM

## 2014-08-27 DIAGNOSIS — F10229 Alcohol dependence with intoxication, unspecified: Secondary | ICD-10-CM | POA: Diagnosis present

## 2014-08-27 DIAGNOSIS — Z885 Allergy status to narcotic agent status: Secondary | ICD-10-CM

## 2014-08-27 DIAGNOSIS — Z6828 Body mass index (BMI) 28.0-28.9, adult: Secondary | ICD-10-CM

## 2014-08-27 DIAGNOSIS — E785 Hyperlipidemia, unspecified: Secondary | ICD-10-CM | POA: Diagnosis present

## 2014-08-27 DIAGNOSIS — Z888 Allergy status to other drugs, medicaments and biological substances status: Secondary | ICD-10-CM

## 2014-08-27 DIAGNOSIS — Z91040 Latex allergy status: Secondary | ICD-10-CM

## 2014-08-27 DIAGNOSIS — F329 Major depressive disorder, single episode, unspecified: Secondary | ICD-10-CM | POA: Diagnosis present

## 2014-08-27 DIAGNOSIS — F3289 Other specified depressive episodes: Secondary | ICD-10-CM | POA: Diagnosis present

## 2014-08-27 DIAGNOSIS — R5383 Other fatigue: Secondary | ICD-10-CM | POA: Diagnosis present

## 2014-08-27 DIAGNOSIS — K859 Acute pancreatitis without necrosis or infection, unspecified: Secondary | ICD-10-CM | POA: Diagnosis present

## 2014-08-27 DIAGNOSIS — F10939 Alcohol use, unspecified with withdrawal, unspecified: Secondary | ICD-10-CM

## 2014-08-27 DIAGNOSIS — F10932 Alcohol use, unspecified with withdrawal with perceptual disturbance: Secondary | ICD-10-CM

## 2014-08-27 HISTORY — DX: Diverticulitis of intestine, part unspecified, without perforation or abscess without bleeding: K57.92

## 2014-08-27 HISTORY — DX: Inflammatory liver disease, unspecified: K75.9

## 2014-08-27 LAB — CBC
HCT: 36.4 % (ref 36.0–46.0)
Hemoglobin: 12.9 g/dL (ref 12.0–15.0)
MCH: 33.1 pg (ref 26.0–34.0)
MCHC: 35.4 g/dL (ref 30.0–36.0)
MCV: 93.3 fL (ref 78.0–100.0)
Platelets: 167 10*3/uL (ref 150–400)
RBC: 3.9 MIL/uL (ref 3.87–5.11)
RDW: 15 % (ref 11.5–15.5)
WBC: 3.3 10*3/uL — ABNORMAL LOW (ref 4.0–10.5)

## 2014-08-27 MED ORDER — METOCLOPRAMIDE HCL 5 MG/ML IJ SOLN
10.0000 mg | Freq: Once | INTRAMUSCULAR | Status: AC
  Administered 2014-08-27: 10 mg via INTRAMUSCULAR
  Filled 2014-08-27: qty 2

## 2014-08-27 NOTE — ED Notes (Signed)
Pt states that she is alcoholic and she is wanting detox; pt reports that she drinks 2-3 quarts per day on average; pt states that she has been drinking today but unsure of how much; husband reports that for the last several days pt has been so drunk that she has been unable to get around the house; pt has been incontinent of stool upon arrival to triage; husband reports some nausea / vomiting but not consistently; pt denies SI / HI

## 2014-08-28 ENCOUNTER — Inpatient Hospital Stay (HOSPITAL_COMMUNITY): Payer: 59

## 2014-08-28 ENCOUNTER — Encounter (HOSPITAL_COMMUNITY): Payer: Self-pay | Admitting: Internal Medicine

## 2014-08-28 ENCOUNTER — Ambulatory Visit (HOSPITAL_COMMUNITY): Payer: 59

## 2014-08-28 DIAGNOSIS — I10 Essential (primary) hypertension: Secondary | ICD-10-CM | POA: Diagnosis present

## 2014-08-28 DIAGNOSIS — Z6828 Body mass index (BMI) 28.0-28.9, adult: Secondary | ICD-10-CM | POA: Diagnosis not present

## 2014-08-28 DIAGNOSIS — E43 Unspecified severe protein-calorie malnutrition: Secondary | ICD-10-CM | POA: Diagnosis present

## 2014-08-28 DIAGNOSIS — Z91040 Latex allergy status: Secondary | ICD-10-CM | POA: Diagnosis not present

## 2014-08-28 DIAGNOSIS — Z888 Allergy status to other drugs, medicaments and biological substances status: Secondary | ICD-10-CM | POA: Diagnosis not present

## 2014-08-28 DIAGNOSIS — E119 Type 2 diabetes mellitus without complications: Secondary | ICD-10-CM | POA: Diagnosis present

## 2014-08-28 DIAGNOSIS — R5381 Other malaise: Secondary | ICD-10-CM | POA: Diagnosis present

## 2014-08-28 DIAGNOSIS — F411 Generalized anxiety disorder: Secondary | ICD-10-CM | POA: Diagnosis present

## 2014-08-28 DIAGNOSIS — E876 Hypokalemia: Secondary | ICD-10-CM | POA: Diagnosis present

## 2014-08-28 DIAGNOSIS — K7010 Alcoholic hepatitis without ascites: Secondary | ICD-10-CM | POA: Diagnosis present

## 2014-08-28 DIAGNOSIS — F1010 Alcohol abuse, uncomplicated: Secondary | ICD-10-CM

## 2014-08-28 DIAGNOSIS — D72819 Decreased white blood cell count, unspecified: Secondary | ICD-10-CM | POA: Diagnosis present

## 2014-08-28 DIAGNOSIS — Z8249 Family history of ischemic heart disease and other diseases of the circulatory system: Secondary | ICD-10-CM | POA: Diagnosis not present

## 2014-08-28 DIAGNOSIS — I959 Hypotension, unspecified: Secondary | ICD-10-CM | POA: Diagnosis present

## 2014-08-28 DIAGNOSIS — K859 Acute pancreatitis without necrosis or infection, unspecified: Secondary | ICD-10-CM | POA: Diagnosis present

## 2014-08-28 DIAGNOSIS — Z882 Allergy status to sulfonamides status: Secondary | ICD-10-CM | POA: Diagnosis not present

## 2014-08-28 DIAGNOSIS — R5383 Other fatigue: Secondary | ICD-10-CM | POA: Diagnosis present

## 2014-08-28 DIAGNOSIS — K759 Inflammatory liver disease, unspecified: Secondary | ICD-10-CM

## 2014-08-28 DIAGNOSIS — E872 Acidosis, unspecified: Secondary | ICD-10-CM | POA: Diagnosis present

## 2014-08-28 DIAGNOSIS — E785 Hyperlipidemia, unspecified: Secondary | ICD-10-CM | POA: Diagnosis present

## 2014-08-28 DIAGNOSIS — R7989 Other specified abnormal findings of blood chemistry: Secondary | ICD-10-CM | POA: Diagnosis present

## 2014-08-28 DIAGNOSIS — D696 Thrombocytopenia, unspecified: Secondary | ICD-10-CM | POA: Diagnosis present

## 2014-08-28 DIAGNOSIS — F10931 Alcohol use, unspecified with withdrawal delirium: Principal | ICD-10-CM

## 2014-08-28 DIAGNOSIS — F10231 Alcohol dependence with withdrawal delirium: Principal | ICD-10-CM

## 2014-08-28 DIAGNOSIS — F329 Major depressive disorder, single episode, unspecified: Secondary | ICD-10-CM | POA: Diagnosis present

## 2014-08-28 DIAGNOSIS — F10929 Alcohol use, unspecified with intoxication, unspecified: Secondary | ICD-10-CM | POA: Diagnosis present

## 2014-08-28 DIAGNOSIS — E871 Hypo-osmolality and hyponatremia: Secondary | ICD-10-CM | POA: Diagnosis present

## 2014-08-28 DIAGNOSIS — F3289 Other specified depressive episodes: Secondary | ICD-10-CM | POA: Diagnosis present

## 2014-08-28 DIAGNOSIS — F10229 Alcohol dependence with intoxication, unspecified: Secondary | ICD-10-CM | POA: Diagnosis present

## 2014-08-28 DIAGNOSIS — Z87891 Personal history of nicotine dependence: Secondary | ICD-10-CM | POA: Diagnosis not present

## 2014-08-28 DIAGNOSIS — Z79899 Other long term (current) drug therapy: Secondary | ICD-10-CM | POA: Diagnosis not present

## 2014-08-28 DIAGNOSIS — IMO0001 Reserved for inherently not codable concepts without codable children: Secondary | ICD-10-CM | POA: Diagnosis present

## 2014-08-28 DIAGNOSIS — Z885 Allergy status to narcotic agent status: Secondary | ICD-10-CM | POA: Diagnosis not present

## 2014-08-28 DIAGNOSIS — D638 Anemia in other chronic diseases classified elsewhere: Secondary | ICD-10-CM | POA: Diagnosis present

## 2014-08-28 HISTORY — DX: Inflammatory liver disease, unspecified: K75.9

## 2014-08-28 LAB — HEPATIC FUNCTION PANEL
ALT: 175 U/L — ABNORMAL HIGH (ref 0–35)
AST: 362 U/L — ABNORMAL HIGH (ref 0–37)
Albumin: 2.9 g/dL — ABNORMAL LOW (ref 3.5–5.2)
Alkaline Phosphatase: 281 U/L — ABNORMAL HIGH (ref 39–117)
Bilirubin, Direct: 1.2 mg/dL — ABNORMAL HIGH (ref 0.0–0.3)
Indirect Bilirubin: 0.6 mg/dL (ref 0.3–0.9)
Total Bilirubin: 1.8 mg/dL — ABNORMAL HIGH (ref 0.3–1.2)
Total Protein: 5.5 g/dL — ABNORMAL LOW (ref 6.0–8.3)

## 2014-08-28 LAB — BASIC METABOLIC PANEL
Anion gap: 23 — ABNORMAL HIGH (ref 5–15)
Anion gap: 26 — ABNORMAL HIGH (ref 5–15)
Anion gap: 23 — ABNORMAL HIGH (ref 5–15)
Anion gap: 20 — ABNORMAL HIGH (ref 5–15)
Anion gap: 18 — ABNORMAL HIGH (ref 5–15)
BUN: 13 mg/dL (ref 6–23)
BUN: 12 mg/dL (ref 6–23)
BUN: 12 mg/dL (ref 6–23)
BUN: 12 mg/dL (ref 6–23)
BUN: 12 mg/dL (ref 6–23)
CO2: 16 mEq/L — ABNORMAL LOW (ref 19–32)
CO2: 13 mEq/L — ABNORMAL LOW (ref 19–32)
CO2: 15 mEq/L — ABNORMAL LOW (ref 19–32)
CO2: 16 mEq/L — ABNORMAL LOW (ref 19–32)
CO2: 20 mEq/L (ref 19–32)
Calcium: 7.6 mg/dL — ABNORMAL LOW (ref 8.4–10.5)
Calcium: 7.1 mg/dL — ABNORMAL LOW (ref 8.4–10.5)
Calcium: 7.4 mg/dL — ABNORMAL LOW (ref 8.4–10.5)
Calcium: 7.1 mg/dL — ABNORMAL LOW (ref 8.4–10.5)
Calcium: 7.9 mg/dL — ABNORMAL LOW (ref 8.4–10.5)
Chloride: 76 mEq/L — ABNORMAL LOW (ref 96–112)
Chloride: 81 mEq/L — ABNORMAL LOW (ref 96–112)
Chloride: 82 mEq/L — ABNORMAL LOW (ref 96–112)
Chloride: 85 mEq/L — ABNORMAL LOW (ref 96–112)
Chloride: 85 mEq/L — ABNORMAL LOW (ref 96–112)
Creatinine, Ser: 0.86 mg/dL (ref 0.50–1.10)
Creatinine, Ser: 0.87 mg/dL (ref 0.50–1.10)
Creatinine, Ser: 0.77 mg/dL (ref 0.50–1.10)
Creatinine, Ser: 0.76 mg/dL (ref 0.50–1.10)
Creatinine, Ser: 0.68 mg/dL (ref 0.50–1.10)
GFR calc Af Amer: 80 mL/min — ABNORMAL LOW (ref >90)
GFR calc Af Amer: 79 mL/min — ABNORMAL LOW (ref >90)
GFR calc Af Amer: >90 mL/min (ref >90)
GFR calc Af Amer: >90 mL/min (ref >90)
GFR calc Af Amer: >90 mL/min (ref >90)
GFR calc non Af Amer: 69 mL/min — ABNORMAL LOW (ref >90)
GFR calc non Af Amer: 68 mL/min — ABNORMAL LOW (ref >90)
GFR calc non Af Amer: 86 mL/min — ABNORMAL LOW (ref >90)
GFR calc non Af Amer: 86 mL/min — ABNORMAL LOW (ref >90)
GFR calc non Af Amer: 89 mL/min — ABNORMAL LOW (ref >90)
Glucose, Bld: 130 mg/dL — ABNORMAL HIGH (ref 70–99)
Glucose, Bld: 96 mg/dL (ref 70–99)
Glucose, Bld: 112 mg/dL — ABNORMAL HIGH (ref 70–99)
Glucose, Bld: 87 mg/dL (ref 70–99)
Glucose, Bld: 114 mg/dL — ABNORMAL HIGH (ref 70–99)
Potassium: 4.2 mEq/L (ref 3.7–5.3)
Potassium: 4.1 mEq/L (ref 3.7–5.3)
Potassium: 3.9 mEq/L (ref 3.7–5.3)
Potassium: 4.4 mEq/L (ref 3.7–5.3)
Potassium: 4.4 mEq/L (ref 3.7–5.3)
Sodium: 115 mEq/L — CL (ref 137–147)
Sodium: 120 mEq/L — CL (ref 137–147)
Sodium: 120 mEq/L — CL (ref 137–147)
Sodium: 121 mEq/L — CL (ref 137–147)
Sodium: 123 mEq/L — ABNORMAL LOW (ref 137–147)

## 2014-08-28 LAB — URINALYSIS W MICROSCOPIC (NOT AT ARMC)
Bilirubin Urine: NEGATIVE
Glucose, UA: NEGATIVE mg/dL
Ketones, ur: 15 mg/dL — AB
Nitrite: POSITIVE — AB
Protein, ur: 30 mg/dL — AB
Specific Gravity, Urine: 1.016 (ref 1.005–1.030)
Urobilinogen, UA: 1 mg/dL (ref 0.0–1.0)
pH: 7.5 (ref 5.0–8.0)

## 2014-08-28 LAB — RAPID URINE DRUG SCREEN, HOSP PERFORMED
Amphetamines: NOT DETECTED
Barbiturates: NOT DETECTED
Benzodiazepines: POSITIVE — AB
Cocaine: NOT DETECTED
Opiates: NOT DETECTED
Tetrahydrocannabinol: NOT DETECTED

## 2014-08-28 LAB — CBC WITH DIFFERENTIAL/PLATELET
Basophils Absolute: 0 10*3/uL (ref 0.0–0.1)
Basophils Relative: 0 % (ref 0–1)
Eosinophils Absolute: 0 10*3/uL (ref 0.0–0.7)
Eosinophils Relative: 0 % (ref 0–5)
HCT: 29 % — ABNORMAL LOW (ref 36.0–46.0)
Hemoglobin: 10.4 g/dL — ABNORMAL LOW (ref 12.0–15.0)
Lymphocytes Relative: 19 % (ref 12–46)
Lymphs Abs: 1 10*3/uL (ref 0.7–4.0)
MCH: 33.8 pg (ref 26.0–34.0)
MCHC: 35.9 g/dL (ref 30.0–36.0)
MCV: 94.2 fL (ref 78.0–100.0)
Monocytes Absolute: 0.4 10*3/uL (ref 0.1–1.0)
Monocytes Relative: 8 % (ref 3–12)
Neutro Abs: 3.8 10*3/uL (ref 1.7–7.7)
Neutrophils Relative %: 73 % (ref 43–77)
Platelets: 128 10*3/uL — ABNORMAL LOW (ref 150–400)
RBC: 3.08 MIL/uL — ABNORMAL LOW (ref 3.87–5.11)
RDW: 15.2 % (ref 11.5–15.5)
WBC: 5.3 10*3/uL (ref 4.0–10.5)

## 2014-08-28 LAB — GLUCOSE, CAPILLARY
Glucose-Capillary: 111 mg/dL — ABNORMAL HIGH (ref 70–99)
Glucose-Capillary: 119 mg/dL — ABNORMAL HIGH (ref 70–99)
Glucose-Capillary: 89 mg/dL (ref 70–99)
Glucose-Capillary: 142 mg/dL — ABNORMAL HIGH (ref 70–99)

## 2014-08-28 LAB — COMPREHENSIVE METABOLIC PANEL WITH GFR
ALT: 227 U/L — ABNORMAL HIGH (ref 0–35)
AST: 425 U/L — ABNORMAL HIGH (ref 0–37)
Albumin: 3.9 g/dL (ref 3.5–5.2)
Alkaline Phosphatase: 377 U/L — ABNORMAL HIGH (ref 39–117)
Anion gap: 30 — ABNORMAL HIGH (ref 5–15)
BUN: 12 mg/dL (ref 6–23)
CO2: 18 meq/L — ABNORMAL LOW (ref 19–32)
Calcium: 8.9 mg/dL (ref 8.4–10.5)
Chloride: 70 meq/L — ABNORMAL LOW (ref 96–112)
Creatinine, Ser: 0.66 mg/dL (ref 0.50–1.10)
GFR calc Af Amer: >90 mL/min
GFR calc non Af Amer: >90 mL/min
Glucose, Bld: 168 mg/dL — ABNORMAL HIGH (ref 70–99)
Potassium: 4.7 meq/L (ref 3.7–5.3)
Sodium: 118 meq/L — CL (ref 137–147)
Total Bilirubin: 2.1 mg/dL — ABNORMAL HIGH (ref 0.3–1.2)
Total Protein: 7.2 g/dL (ref 6.0–8.3)

## 2014-08-28 LAB — CBC
HCT: 31.4 % — ABNORMAL LOW (ref 36.0–46.0)
Hemoglobin: 11 g/dL — ABNORMAL LOW (ref 12.0–15.0)
MCH: 33.6 pg (ref 26.0–34.0)
MCHC: 35 g/dL (ref 30.0–36.0)
MCV: 96 fL (ref 78.0–100.0)
Platelets: 130 10*3/uL — ABNORMAL LOW (ref 150–400)
RBC: 3.27 MIL/uL — ABNORMAL LOW (ref 3.87–5.11)
RDW: 15.2 % (ref 11.5–15.5)
WBC: 4.6 10*3/uL (ref 4.0–10.5)

## 2014-08-28 LAB — CORTISOL: Cortisol, Plasma: 25.7 ug/dL

## 2014-08-28 LAB — TROPONIN I: Troponin I: <0.3 ng/mL (ref <0.30)

## 2014-08-28 LAB — HEPATITIS PANEL, ACUTE
HCV Ab: NEGATIVE
Hep A IgM: NONREACTIVE
Hep B C IgM: NONREACTIVE
Hepatitis B Surface Ag: NEGATIVE

## 2014-08-28 LAB — OSMOLALITY: Osmolality: 290 mOsm/kg (ref 275–300)

## 2014-08-28 LAB — MRSA PCR SCREENING: MRSA by PCR: NEGATIVE

## 2014-08-28 LAB — PROTIME-INR
INR: 1.05 (ref 0.00–1.49)
Prothrombin Time: 13.7 seconds (ref 11.6–15.2)

## 2014-08-28 LAB — SALICYLATE LEVEL: Salicylate Lvl: <2 mg/dL — ABNORMAL LOW (ref 2.8–20.0)

## 2014-08-28 LAB — ACETAMINOPHEN LEVEL: Acetaminophen (Tylenol), Serum: <15 ug/mL (ref 10–30)

## 2014-08-28 LAB — PROCALCITONIN: Procalcitonin: 0.15 ng/mL

## 2014-08-28 LAB — LACTIC ACID, PLASMA: Lactic Acid, Venous: 3.8 mmol/L — ABNORMAL HIGH (ref 0.5–2.2)

## 2014-08-28 LAB — SODIUM, URINE, RANDOM: Sodium, Ur: 33 meq/L

## 2014-08-28 LAB — ETHANOL: Alcohol, Ethyl (B): 351 mg/dL — ABNORMAL HIGH (ref 0–11)

## 2014-08-28 LAB — CK: Total CK: 246 U/L — ABNORMAL HIGH (ref 7–177)

## 2014-08-28 LAB — LIPASE, BLOOD: Lipase: 96 U/L — ABNORMAL HIGH (ref 11–59)

## 2014-08-28 LAB — TSH: TSH: 4.15 u[IU]/mL (ref 0.350–4.500)

## 2014-08-28 LAB — OSMOLALITY, URINE: Osmolality, Ur: 325 mosm/kg — ABNORMAL LOW (ref 390–1090)

## 2014-08-28 MED ORDER — THIAMINE HCL 100 MG/ML IJ SOLN
100.0000 mg | Freq: Every day | INTRAMUSCULAR | Status: DC
  Filled 2014-08-28 (×2): qty 1
  Filled 2014-08-28: qty 2
  Filled 2014-08-28 (×2): qty 1

## 2014-08-28 MED ORDER — ONDANSETRON HCL 4 MG PO TABS
4.0000 mg | ORAL_TABLET | Freq: Four times a day (QID) | ORAL | Status: DC | PRN
  Administered 2014-08-30 – 2014-09-05 (×12): 4 mg via ORAL
  Filled 2014-08-28 (×13): qty 1

## 2014-08-28 MED ORDER — NEBIVOLOL HCL 5 MG PO TABS
5.0000 mg | ORAL_TABLET | Freq: Every day | ORAL | Status: DC
  Administered 2014-08-28 – 2014-08-29 (×2): 5 mg via ORAL
  Filled 2014-08-28 (×2): qty 1

## 2014-08-28 MED ORDER — SODIUM CHLORIDE 0.9 % IJ SOLN
3.0000 mL | Freq: Two times a day (BID) | INTRAMUSCULAR | Status: DC
  Administered 2014-08-28 – 2014-09-04 (×8): 3 mL via INTRAVENOUS

## 2014-08-28 MED ORDER — SODIUM CHLORIDE 0.9 % IV SOLN
INTRAVENOUS | Status: DC
Start: 2014-08-28 — End: 2014-08-28

## 2014-08-28 MED ORDER — LORAZEPAM 2 MG/ML IJ SOLN
2.0000 mg | Freq: Once | INTRAMUSCULAR | Status: AC
  Administered 2014-08-28: 2 mg via INTRAVENOUS
  Filled 2014-08-28: qty 1

## 2014-08-28 MED ORDER — ESCITALOPRAM OXALATE 20 MG PO TABS
20.0000 mg | ORAL_TABLET | Freq: Every day | ORAL | Status: DC
  Administered 2014-08-28 – 2014-09-05 (×9): 20 mg via ORAL
  Filled 2014-08-28 (×9): qty 1

## 2014-08-28 MED ORDER — LINAGLIPTIN 5 MG PO TABS
5.0000 mg | ORAL_TABLET | Freq: Every day | ORAL | Status: DC
  Administered 2014-08-28 – 2014-09-05 (×9): 5 mg via ORAL
  Filled 2014-08-28 (×9): qty 1

## 2014-08-28 MED ORDER — SODIUM CHLORIDE 0.9 % IV SOLN
INTRAVENOUS | Status: DC
  Administered 2014-08-28 – 2014-09-04 (×6): via INTRAVENOUS

## 2014-08-28 MED ORDER — IOHEXOL 300 MG/ML  SOLN
100.0000 mL | Freq: Once | INTRAMUSCULAR | Status: AC | PRN
  Administered 2014-08-28: 100 mL via INTRAVENOUS

## 2014-08-28 MED ORDER — ONDANSETRON HCL 4 MG/2ML IJ SOLN
4.0000 mg | Freq: Once | INTRAMUSCULAR | Status: AC
  Administered 2014-08-28: 4 mg via INTRAVENOUS
  Filled 2014-08-28: qty 2

## 2014-08-28 MED ORDER — LORAZEPAM 2 MG/ML IJ SOLN
0.0000 mg | Freq: Two times a day (BID) | INTRAMUSCULAR | Status: AC
  Administered 2014-08-30 (×3): 2 mg via INTRAVENOUS
  Filled 2014-08-28 (×2): qty 1
  Filled 2014-08-28: qty 2

## 2014-08-28 MED ORDER — LORAZEPAM 2 MG/ML IJ SOLN
0.0000 mg | Freq: Four times a day (QID) | INTRAMUSCULAR | Status: AC
  Administered 2014-08-28: 2 mg via INTRAVENOUS
  Administered 2014-08-29: 3 mg via INTRAVENOUS
  Filled 2014-08-28 (×4): qty 1

## 2014-08-28 MED ORDER — ADULT MULTIVITAMIN W/MINERALS CH
1.0000 | ORAL_TABLET | Freq: Every day | ORAL | Status: DC
  Administered 2014-08-28 – 2014-09-05 (×9): 1 via ORAL
  Filled 2014-08-28 (×9): qty 1

## 2014-08-28 MED ORDER — SODIUM CHLORIDE 0.9 % IV BOLUS (SEPSIS)
1000.0000 mL | Freq: Once | INTRAVENOUS | Status: AC
  Administered 2014-08-28: 1000 mL via INTRAVENOUS

## 2014-08-28 MED ORDER — IOHEXOL 300 MG/ML  SOLN
25.0000 mL | INTRAMUSCULAR | Status: AC
  Administered 2014-08-28 (×2): 25 mL via ORAL

## 2014-08-28 MED ORDER — ENOXAPARIN SODIUM 40 MG/0.4ML ~~LOC~~ SOLN
40.0000 mg | SUBCUTANEOUS | Status: DC
  Administered 2014-08-28 – 2014-08-29 (×2): 40 mg via SUBCUTANEOUS
  Filled 2014-08-28 (×2): qty 0.4

## 2014-08-28 MED ORDER — ONDANSETRON HCL 4 MG/2ML IJ SOLN
4.0000 mg | Freq: Four times a day (QID) | INTRAMUSCULAR | Status: DC | PRN
Start: 2014-08-28 — End: 2014-09-05
  Administered 2014-08-28 – 2014-09-01 (×6): 4 mg via INTRAVENOUS
  Filled 2014-08-28 (×6): qty 2

## 2014-08-28 MED ORDER — LORAZEPAM 2 MG/ML IJ SOLN
1.0000 mg | Freq: Four times a day (QID) | INTRAMUSCULAR | Status: DC | PRN
  Administered 2014-08-28 – 2014-08-29 (×2): 1 mg via INTRAVENOUS
  Filled 2014-08-28: qty 1

## 2014-08-28 MED ORDER — SODIUM CHLORIDE 0.9 % IV BOLUS (SEPSIS)
2000.0000 mL | Freq: Once | INTRAVENOUS | Status: AC
  Administered 2014-08-28: 2000 mL via INTRAVENOUS

## 2014-08-28 MED ORDER — FOLIC ACID 1 MG PO TABS
1.0000 mg | ORAL_TABLET | Freq: Every day | ORAL | Status: DC
  Administered 2014-08-28 – 2014-09-05 (×9): 1 mg via ORAL
  Filled 2014-08-28 (×9): qty 1

## 2014-08-28 MED ORDER — CETYLPYRIDINIUM CHLORIDE 0.05 % MT LIQD
7.0000 mL | Freq: Two times a day (BID) | OROMUCOSAL | Status: DC
  Administered 2014-08-28 – 2014-09-05 (×14): 7 mL via OROMUCOSAL

## 2014-08-28 MED ORDER — VITAMIN B-1 100 MG PO TABS
100.0000 mg | ORAL_TABLET | Freq: Every day | ORAL | Status: DC
  Administered 2014-08-28 – 2014-09-05 (×9): 100 mg via ORAL
  Filled 2014-08-28 (×9): qty 1

## 2014-08-28 MED ORDER — INSULIN ASPART 100 UNIT/ML ~~LOC~~ SOLN
0.0000 [IU] | Freq: Three times a day (TID) | SUBCUTANEOUS | Status: DC
  Administered 2014-08-29: 2 [IU] via SUBCUTANEOUS
  Administered 2014-08-29 – 2014-08-30 (×2): 1 [IU] via SUBCUTANEOUS
  Administered 2014-08-30: 3 [IU] via SUBCUTANEOUS
  Administered 2014-08-31 – 2014-09-01 (×5): 2 [IU] via SUBCUTANEOUS
  Administered 2014-09-01: 1 [IU] via SUBCUTANEOUS
  Administered 2014-09-02: 2 [IU] via SUBCUTANEOUS
  Administered 2014-09-02: 3 [IU] via SUBCUTANEOUS

## 2014-08-28 MED ORDER — LISINOPRIL 2.5 MG PO TABS
5.0000 mg | ORAL_TABLET | Freq: Every day | ORAL | Status: DC
Start: 2014-08-28 — End: 2014-08-28

## 2014-08-28 MED ORDER — PIPERACILLIN-TAZOBACTAM 3.375 G IVPB
3.3750 g | Freq: Three times a day (TID) | INTRAVENOUS | Status: DC
  Administered 2014-08-28: 3.375 g via INTRAVENOUS
  Filled 2014-08-28: qty 50

## 2014-08-28 MED ORDER — LORAZEPAM 1 MG PO TABS: 1.0000 mg | ORAL_TABLET | Freq: Four times a day (QID) | ORAL | Status: DC | PRN

## 2014-08-28 NOTE — Progress Notes (Addendum)
Pt seen and examined. Please see earlier admission note by Dr. Hal Hope. This is the addendum to the admission note. Pt is 68 yo female with long history of ETOH abuse and multiple admissions to ED and [facility] for detoxification in the past 10 years. Presentsed to [facility] ED 9/16 after being on 3 weeks of binge drinking. Pt is rather tremulous  on exam this AM but not in acute distress. Currently on CIWA protocol. PCCM following as well as pt may need to be placed on Precedex drip. For now will continue IVF, antiemetics if needed. D/C Zosyn as no clear infectious source noted. CT abd pending this AM.   Faye Ramsay, MD  [facility] Pager [phone] Cell [phone]  If 7PM-7AM, please contact night-coverage [email or web address] Password [email or web address]

## 2014-08-28 NOTE — Progress Notes (Signed)
Pt's BP 86/44. Pt is asymptomatic. Second liter NS bolus infusing. MD aware. New orders placed. Will continue to monitor closely.

## 2014-08-28 NOTE — Progress Notes (Signed)
Bladder scan performed at bedside due to patient not voiding in 8 hours, >680 cc shown. Sterile procedure of straight catherization patient, 700 ml of amber, malodorous urine expelled.

## 2014-08-28 NOTE — ED Notes (Signed)
IV insertion attempted x 2, unable to obtain IV access, another RN to attempt

## 2014-08-28 NOTE — H&P (Addendum)
[facility] History and Physical  [name]:[id number] DOB: 09-19-1946 DOA: 08/27/2014  Referring physician: ER physician. PCP: [name], MD  Chief Complaint: Alcohol intoxication and wants detox.  HPI: [name] is a 68 y.o. female history of alcoholism drinks wine every night and has been drinking more than usual last 3 weeks with history of hypertension hyperlipidemia and diabetes mellitus type 2 was brought to the ER by patient's husband as patient has been drinking alcohol more than usual and wanted detox. Patient states she also has been having some abdominal discomfort mostly in the lower quadrants over the last 4 days with some nausea and vomiting and occasional diarrhea. Denies using any recent antibiotics. In the ER patient was mildly hypotensive and sodium was 118. Patient alcohol level was high and liver function was elevated. Patient has been admitted for further management of alcohol intoxication with impending delirium tremens and hyponatremia. Patient denies any chest pain or shortness of breath fever chills. Patient states last few days she has not been eating well because of vomiting.  Review of Systems: As presented the history of presenting illness, rest negative.  Past Medical History  Diagnosis Date  . Alcohol abuse   . Diabetes mellitus   . Hypertension   . Fibromyalgia   . Depression   . Carotid artery stenosis   . Diverticulitis   . Hepatitis 08/28/2014   Past Surgical History  Procedure Laterality Date  . Cesarean section    . Tonsillectomy    . Bunionectomy     Social History:  reports that she quit smoking about 4 years ago. She has never used smokeless tobacco. She reports that she drinks alcohol. She reports that she does not use illicit drugs. Where does patient live home. Can patient participate in ADLs? Yes.  Allergies  Allergen Reactions  . Latex Other (See Comments)    Break outs  . Lipitor [Atorvastatin Calcium]  Other (See Comments)    Muscle aches  . Codeine Rash    And nausea   . Pentazocine Lactate Rash  . Sulfonamide Derivatives Rash    Family History:  Family History  Problem Relation Age of Onset  . Cancer Mother   . Heart disease Father   . Heart disease Sister       Prior to Admission medications   Medication Sig Start Date End Date Taking? Authorizing Provider  b complex vitamins tablet Take 1 tablet by mouth daily.   Yes Historical Provider, MD  diphenoxylate-atropine (LOMOTIL) 2.5-0.025 MG per tablet Take 1 tablet by mouth 4 (four) times daily as needed for diarrhea or loose stools. 07/19/14  Yes Deepak Advani, MD  escitalopram (LEXAPRO) 20 MG tablet Take 20 mg by mouth daily.   Yes Historical Provider, MD  ezetimibe (ZETIA) 10 MG tablet Take 1 tablet (10 mg total) by mouth daily. 07/09/14  Yes Philemon Kingdom, MD  lisinopril (PRINIVIL,ZESTRIL) 5 MG tablet Take 1 tablet (5 mg total) by mouth daily. 07/09/14  Yes Philemon Kingdom, MD  LORazepam (ATIVAN) 0.5 MG tablet Take 1 tablet (0.5 mg total) by mouth every 8 (eight) hours as needed for anxiety. 07/17/14  Yes Shanker Kristeen Mans, MD  nebivolol (BYSTOLIC) 5 MG tablet Take 1 tablet (5 mg total) by mouth daily. 07/09/14  Yes Philemon Kingdom, MD  sitaGLIPtin (JANUVIA) 100 MG tablet Take 1 tablet (100 mg total) by mouth daily. 07/09/14  Yes Philemon Kingdom, MD    Physical Exam: Filed Vitals:   08/27/14 2217 08/28/14  0132 08/28/14 0215  BP: 93/49 93/49 102/48  Pulse: 71 71 73  Temp: 97.9 F (36.6 C)    TempSrc: Oral    Resp: 16  16  Height: 5\' 3"  (1.6 m)    Weight: 72.576 kg (160 lb)    SpO2: 99%  100%     General:  Well developed and moderately nourished.  Eyes: Anicteric no pallor.  ENT: No discharge from the ears eyes nose mouth.  Neck: No mass felt.  Cardiovascular: S1-S2 heard.  Respiratory: No rhonchi or crepitations.  Abdomen: Soft mildly distended no guarding or rigidity.  Skin: Chronic skin  changes.  Musculoskeletal: No edema.  Psychiatric: Patient is presently alert awake and oriented to time place and person.  Neurologic: Alert awake oriented to time place and person. Moves all extremities.  Labs on Admission:  Basic Metabolic Panel:  Recent Labs Lab 08/27/14 2235  NA 118*  K 4.7  CL 70*  CO2 18*  GLUCOSE 168*  BUN 12  CREATININE 0.66  CALCIUM 8.9   Liver Function Tests:  Recent Labs Lab 08/27/14 2235  AST 425*  ALT 227*  ALKPHOS 377*  BILITOT 2.1*  PROT 7.2  ALBUMIN 3.9   No results found for this basename: LIPASE, AMYLASE,  in the last 168 hours No results found for this basename: AMMONIA,  in the last 168 hours CBC:  Recent Labs Lab 08/27/14 2235  WBC 3.3*  HGB 12.9  HCT 36.4  MCV 93.3  PLT 167   Cardiac Enzymes: No results found for this basename: CKTOTAL, CKMB, CKMBINDEX, TROPONINI,  in the last 168 hours  BNP (last 3 results) No results found for this basename: PROBNP,  in the last 8760 hours CBG: No results found for this basename: GLUCAP,  in the last 168 hours  Radiological Exams on Admission: No results found.   Assessment/Plan Principal Problem:   Alcohol intoxication Active Problems:   DIABETES MELLITUS, TYPE II   HYPERTENSION   Hyponatremia   Hepatitis   1. Alcohol intoxication with alcohol ketoacidosis - patient has been placed on alcohol withdrawal protocol using IV Ativan and thiamine. Since patient has been mildly hypotensive patient will be placed on normal saline bolus and infusion. Patient does have significant hyponatremia which could be from alcoholism but given hypotension at this time we will hydrate with normal saline. Closely follow intake output and metabolic panel.  2. Hyponatremia - probably from alcoholism but given patient's hypotension at this time we will hydrate with normal saline bolus and closely follow metabolic panel. Check urine studies for urine sodium and Osmolality. Check TSH and cortisol  levels. 3. Hypotension - patient does not look septic but I have ordered blood cultures. Check chest x-ray and urinalysis. Check procalcitonin levels. I will place patient empirically on antibiotics for now after blood cultures obtained. 4. Abdominal discomfort - check lipase and follow LFTs. Check CT abdomen and pelvis. Check stool studies.  5. Elevated LFTs most likely from alcohol hepatitis - follow LFTs. Check INR. Check acute hepatitis panel. 6. Diabetes mellitus type 2 - closely follow CBGs with sliding-scale coverage. 7. History of hyperlipidemia - hold Zetia for now. 8. Leukopenia - probably from alcoholism. Check CBC with differential.  Addendum - Patients sodium has decreased further to 115. Patient is still hypotensive. Discussed with critical care. Crticial care advised to get Thiamine level and give 500 cc ns bolus and recheck metabolic panel and they will be seeing in consult.  Code Status: Full code.  Family  Communication: None.  Disposition Plan: Admit to inpatient.    Rifka Ramey N. [facility] Pager [phone].  If 7PM-7AM, please contact night-coverage [email or web address] Password [email or web address] 08/28/2014, 3:31 AM

## 2014-08-28 NOTE — Progress Notes (Signed)
CRITICAL VALUE ALERT  Critical value received:  NA 115  Date of notification:  08/28/14  Time of notification:  0502  Critical value read back:Yes.    Nurse who received alert:  Virgina Norfolk, RN  MD notified (1st page):  Hal Hope  Time of first page:  0529  MD notified (2nd page):  Time of second page:  Responding MD:  Cyndia Diver  Time MD responded:  [phone]

## 2014-08-28 NOTE — Progress Notes (Signed)
ANTIBIOTIC CONSULT NOTE - INITIAL  Pharmacy Consult for Zosyn Indication: Intra-abdominal infection  Allergies  Allergen Reactions  . Latex Other (See Comments)    Break outs  . Lipitor [Atorvastatin Calcium] Other (See Comments)    Muscle aches  . Codeine Rash    And nausea   . Pentazocine Lactate Rash  . Sulfonamide Derivatives Rash    Patient Measurements: Height: 5\' 3"  (160 cm) Weight: 160 lb (72.576 kg) IBW/kg (Calculated) : 52.4   Vital Signs: Temp: 98.2 F (36.8 C) (09/16 0345) Temp src: Oral (09/16 0345) BP: 86/44 mmHg (09/16 0345) Pulse Rate: 73 (09/16 0215) Intake/Output from previous day:   Intake/Output from this shift:    Labs:  Recent Labs  08/27/14 2235 08/28/14 0325  WBC 3.3*  --   HGB 12.9  --   PLT 167  --   CREATININE 0.66 0.86   Estimated Creatinine Clearance: 61.5 ml/min (by C-G formula based on Cr of 0.86). No results found for this basename: VANCOTROUGH, VANCOPEAK, VANCORANDOM, GENTTROUGH, GENTPEAK, GENTRANDOM, TOBRATROUGH, TOBRAPEAK, TOBRARND, AMIKACINPEAK, AMIKACINTROU, AMIKACIN,  in the last 72 hours   Microbiology: No results found for this or any previous visit (from the past 720 hour(s)).  Medical History: Past Medical History  Diagnosis Date  . Alcohol abuse   . Diabetes mellitus   . Hypertension   . Fibromyalgia   . Depression   . Carotid artery stenosis   . Diverticulitis   . Hepatitis 08/28/2014    Medications:  Scheduled:  . antiseptic oral rinse  7 mL Mouth Rinse BID  . enoxaparin (LOVENOX) injection  40 mg Subcutaneous Q24H  . escitalopram  20 mg Oral Daily  . folic acid  1 mg Oral Daily  . insulin aspart  0-9 Units Subcutaneous TID WC  . linagliptin  5 mg Oral Daily  . LORazepam  0-4 mg Intravenous Q6H   Followed by  . [START ON 08/30/2014] LORazepam  0-4 mg Intravenous Q12H  . multivitamin with minerals  1 tablet Oral Daily  . nebivolol  5 mg Oral Daily  . piperacillin-tazobactam (ZOSYN)  IV  3.375 g  Intravenous 3 times per day  . sodium chloride  3 mL Intravenous Q12H  . thiamine  100 mg Oral Daily   Or  . thiamine  100 mg Intravenous Daily   Infusions:   Assessment: 76 yoF with hx alcoholism c/o abdominal discomfort with n/v/d.  Zosyn per Rx for intra-abdominal infection.   Goal of Therapy:  Treat infection  Plan:   Zosyn 3.375 gm IV q8h EI infection  F/u Scr/cultures as needed  Lawana Pai R 08/28/2014,5:26 AM

## 2014-08-28 NOTE — ED Provider Notes (Signed)
CSN: [id number]     Arrival date & time 08/27/14  2153 History   First MD Initiated Contact with Patient 08/28/14 0018     Chief Complaint  Patient presents with  . Alcohol Intoxication  . detox      (Consider location/radiation/quality/duration/timing/severity/associated sxs/prior Treatment) The history is provided by the patient.  KHARLIE BRING is a 68 y.o. female hx of alcohol abuse, DM, HTN here with wanting detox. Drinks about 2-3 quarts daily. Has been vomiting for the last 3 days. Epigastric pain as well. Per family, patient has been very intoxicated. Denies fevers. Last drink today. Denies other drug use.    Past Medical History  Diagnosis Date  . Alcohol abuse   . Diabetes mellitus   . Hypertension   . Fibromyalgia   . Depression   . Carotid artery stenosis   . Diverticulitis    Past Surgical History  Procedure Laterality Date  . Cesarean section    . Tonsillectomy    . Bunionectomy     Family History  Problem Relation Age of Onset  . Cancer Mother   . Heart disease Father   . Heart disease Sister    History  Substance Use Topics  . Smoking status: Former Smoker    Quit date: 03/11/2010  . Smokeless tobacco: Never Used  . Alcohol Use: Yes     Comment: 4-5 bottles of wine per day / 2-3 quarts per day   OB History   Grav Para Term Preterm Abortions TAB SAB Ect Mult Living                 Review of Systems  Gastrointestinal: Positive for nausea, vomiting and abdominal pain.  All other systems reviewed and are negative.     Allergies  Latex; Lipitor; Codeine; Pentazocine lactate; and Sulfonamide derivatives  Home Medications   Prior to Admission medications   Medication Sig Start Date End Date Taking? Authorizing Provider  b complex vitamins tablet Take 1 tablet by mouth daily.   Yes Historical Provider, MD  diphenoxylate-atropine (LOMOTIL) 2.5-0.025 MG per tablet Take 1 tablet by mouth 4 (four) times daily as needed for diarrhea or loose  stools. 07/19/14  Yes Deepak Advani, MD  escitalopram (LEXAPRO) 20 MG tablet Take 20 mg by mouth daily.   Yes Historical Provider, MD  ezetimibe (ZETIA) 10 MG tablet Take 1 tablet (10 mg total) by mouth daily. 07/09/14  Yes Philemon Kingdom, MD  lisinopril (PRINIVIL,ZESTRIL) 5 MG tablet Take 1 tablet (5 mg total) by mouth daily. 07/09/14  Yes Philemon Kingdom, MD  LORazepam (ATIVAN) 0.5 MG tablet Take 1 tablet (0.5 mg total) by mouth every 8 (eight) hours as needed for anxiety. 07/17/14  Yes Shanker Kristeen Mans, MD  nebivolol (BYSTOLIC) 5 MG tablet Take 1 tablet (5 mg total) by mouth daily. 07/09/14  Yes Philemon Kingdom, MD  sitaGLIPtin (JANUVIA) 100 MG tablet Take 1 tablet (100 mg total) by mouth daily. 07/09/14  Yes Philemon Kingdom, MD   BP 93/49  Pulse 71  Temp(Src) 97.9 F (36.6 C) (Oral)  Resp 16  Ht 5\' 3"  (1.6 m)  Wt 160 lb (72.576 kg)  BMI 28.35 kg/m2  SpO2 99% Physical Exam  Nursing note and vitals reviewed. Constitutional:  Chronically ill, dehydrated   HENT:  MM dry   Eyes: Conjunctivae are normal. Pupils are equal, round, and reactive to light.  Neck: Normal range of motion. Neck supple.  Cardiovascular: Normal rate, regular rhythm and normal heart sounds.  Pulmonary/Chest: Effort normal and breath sounds normal. No respiratory distress. She has no wheezes. She has no rales.  Abdominal: Soft.  + epigastric tenderness   Musculoskeletal: Normal range of motion. She exhibits no edema and no tenderness.  Neurological: She is alert.  intoxicated  Skin: Skin is warm and dry.  Psychiatric: She has a normal mood and affect. Her behavior is normal. Judgment and thought content normal.    ED Course  Procedures (including critical care time)  CRITICAL CARE Performed by: Darl Householder, Avyan Livesay   Total critical care time: 30 min  Critical care time was exclusive of separately billable procedures and treating other patients.  Critical care was necessary to treat or prevent imminent or  life-threatening deterioration.  Critical care was time spent personally by me on the following activities: development of treatment plan with patient and/or surrogate as well as nursing, discussions with consultants, evaluation of patient's response to treatment, examination of patient, obtaining history from patient or surrogate, ordering and performing treatments and interventions, ordering and review of laboratory studies, ordering and review of radiographic studies, pulse oximetry and re-evaluation of patient's condition.    Labs Review Labs Reviewed  CBC - Abnormal; Notable for the following:    WBC 3.3 (*)    All other components within normal limits  COMPREHENSIVE METABOLIC PANEL - Abnormal; Notable for the following:    Sodium 118 (*)    Chloride 70 (*)    CO2 18 (*)    Glucose, Bld 168 (*)    AST 425 (*)    ALT 227 (*)    Alkaline Phosphatase 377 (*)    Total Bilirubin 2.1 (*)    Anion gap 30 (*)    All other components within normal limits  ETHANOL - Abnormal; Notable for the following:    Alcohol, Ethyl (B) 351 (*)    All other components within normal limits  SALICYLATE LEVEL - Abnormal; Notable for the following:    Salicylate Lvl <6.6 (*)    All other components within normal limits  ACETAMINOPHEN LEVEL  URINE RAPID DRUG SCREEN (HOSP PERFORMED)  HEPATITIS PANEL, ACUTE    Imaging Review No results found.   EKG Interpretation None      MDM   Final diagnoses:  None    BEADIE MATSUNAGA is a 68 y.o. female here with vomiting. She is intoxicated. Na 118 with AG 30. I was concerned for AKA. Given 2 L NS. LFTs elevated consistent with alcohol hepatitis. Added on hepatitis panel. Will admit to stepdown.     Wandra Arthurs, MD 08/28/14 [phone]

## 2014-08-28 NOTE — Consult Note (Signed)
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 She presents to [facility] ED 9/16 after being on 3 weeks of binge drinking (2-4 quarts of wine vs 1-2 750 ml usual). She is remarkable for a sodium of 188, CO2 13 and chloride 81. She has been treated with fluid resuscitation and has begun a correction in electrolyte imbalance. She is a poorly controlled diabetic with a history of hepatitis, HTN, Depression and anxiety. PCCM asked to evaluate. She is a former smoker. She is also quite tremulous at this time.   PAST MEDICAL HISTORY :  Past Medical History  Diagnosis Date  . Alcohol abuse   . Diabetes mellitus   . Hypertension   . Fibromyalgia   . Depression   . Carotid artery stenosis   . Diverticulitis   . Hepatitis 08/28/2014   Past Surgical History  Procedure Laterality Date  . Cesarean section    . Tonsillectomy    . Bunionectomy     Prior to Admission medications   Medication Sig Start Date End Date Taking? Authorizing Provider  b complex vitamins tablet Take 1 tablet by mouth daily.   Yes Historical Provider, MD  diphenoxylate-atropine (LOMOTIL) 2.5-0.025 MG per tablet Take 1 tablet by mouth 4 (four) times daily as needed for diarrhea or loose stools. 07/19/14  Yes Deepak Advani, MD  escitalopram (LEXAPRO) 20 MG tablet Take 20 mg by mouth daily.   Yes Historical Provider, MD  ezetimibe (ZETIA) 10 MG tablet Take 1 tablet (10 mg total) by mouth daily. 07/09/14  Yes Philemon Kingdom, MD  lisinopril (PRINIVIL,ZESTRIL) 5 MG tablet Take 1  tablet (5 mg total) by mouth daily. 07/09/14  Yes Philemon Kingdom, MD  LORazepam (ATIVAN) 0.5 MG tablet Take 1 tablet (0.5 mg total) by mouth every 8 (eight) hours as needed for anxiety. 07/17/14  Yes Shanker Kristeen Mans, MD  nebivolol (BYSTOLIC) 5 MG tablet Take 1 tablet (5 mg total) by mouth daily. 07/09/14  Yes Philemon Kingdom, MD  sitaGLIPtin (JANUVIA) 100 MG tablet Take 1 tablet (100 mg total) by mouth daily. 07/09/14  Yes Philemon Kingdom, MD   Allergies  Allergen Reactions  . Latex Other (See Comments)    Break outs  . Lipitor [Atorvastatin Calcium] Other (See Comments)    Muscle aches  . Codeine Rash    And nausea   . Pentazocine Lactate Rash  . Sulfonamide Derivatives Rash    FAMILY HISTORY:  Family History  Problem Relation Age of Onset  . Cancer Mother   . Heart disease Father   . Heart disease Sister    SOCIAL HISTORY:  reports that she quit smoking about 4 years ago. She has never used smokeless tobacco. She reports that she drinks alcohol. She reports that she does not use illicit drugs.  REVIEW OF SYSTEMS:   10 point review of system taken, please see HPI for positives and negatives.   SUBJECTIVE:   VITAL SIGNS: Temp:  [97.9 F (36.6 C)-98.2 F (36.8 C)] 98.2 F (36.8  C) (09/16 0345) Pulse Rate:  [71-73] 73 (09/16 0215) Resp:  [16-19] 19 (09/16 0345) BP: (86-102)/(44-49) 86/44 mmHg (09/16 0345) SpO2:  [93 %-100 %] 93 % (09/16 0345) Weight:  [160 lb (72.576 kg)-163 lb 9.3 oz (74.2 kg)] 163 lb 9.3 oz (74.2 kg) (09/16 0300) HEMODYNAMICS:   VENTILATOR SETTINGS:   INTAKE / OUTPUT:  Intake/Output Summary (Last 24 hours) at 08/28/14 0902 Last data filed at 08/28/14 0700  Gross per 24 hour  Intake     50 ml  Output      0 ml  Net     50 ml    PHYSICAL EXAMINATION: General: Tremulous WF, very uncomfortable, but stable  Neuro:  Alert but anxious, may be in early DT's HEENT:  No JVD LAN Cardiovascular:  HSR RRR Lungs:  CTA Abdomen:  Distended, non  tender, + bs Musculoskeletal:  intact Skin:  warm  LABS:  CBC  Recent Labs Lab 08/27/14 2235 08/28/14 0414 08/28/14 0630  WBC 3.3* 4.6 5.3  HGB 12.9 11.0* 10.4*  HCT 36.4 31.4* 29.0*  PLT 167 130* 128*   Coag's  Recent Labs Lab 08/28/14 0325  INR 1.05   BMET  Recent Labs Lab 08/27/14 2235 08/28/14 0325 08/28/14 0630  NA 118* 115* 120*  K 4.7 4.2 4.1  CL 70* 76* 81*  CO2 18* 16* 13*  BUN 12 13 12   CREATININE 0.66 0.86 0.87  GLUCOSE 168* 130* 96   Electrolytes  Recent Labs Lab 08/27/14 2235 08/28/14 0325 08/28/14 0630  CALCIUM 8.9 7.6* 7.1*   Sepsis Markers  Recent Labs Lab 08/28/14 0630 08/28/14 0631  LATICACIDVEN  --  3.8*  PROCALCITON 0.15  --    ABG No results found for this basename: PHART, PCO2ART, PO2ART,  in the last 168 hours Liver Enzymes  Recent Labs Lab 08/27/14 2235 08/28/14 0630  AST 425* 362*  ALT 227* 175*  ALKPHOS 377* 281*  BILITOT 2.1* 1.8*  ALBUMIN 3.9 2.9*   Cardiac Enzymes  Recent Labs Lab 08/28/14 0630  TROPONINI <0.30   Glucose No results found for this basename: GLUCAP,  in the last 168 hours  Imaging No results found.   ASSESSMENT / PLAN:  PULMONARY OETT A: Former smoker Not normally on O2 on Bd's P:   O2 as needed  CARDIOVASCULAR CVL A:  Hypotension Hx of hypertension P:  BP should stabilize with IVH plus stopping antihypertensives  Consider holding Bystolic for now.  RENAL A:   Hyponatremia, most likely secondary to ETOH abuse (na 118 on admit) Hypochloremia  P:   Hydration with gradual correction of NA/CL Close monitoring of electrolyte changes   GASTROINTESTINAL A:   No acute issue P:     HEMATOLOGIC A:   No acute issue P:    INFECTIOUS A:   No overt infectious process P:   BCx2 9/16>> UC Sputum 9/16 pip-tazo >> Consider d/c abx, ? Source infection  ENDOCRINE A:   DM   P:   SSI  NEUROLOGIC A:   ETOH withdrawal Depression Anxiety P:   RASS  goal:1 Anxiolytics per IM. 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 She presents to [facility] ED 9/16 after being on 3 weeks of binge drinking (2-4 quarts of wine vs 1-2 750  ml usual). She is remarkable for a sodium of 188, CO2 13 and chloride 81. She has been treated with fluid resuscitation and has begun a correction in electrolyte imbalance. She is a poorly controlled diabetic with a history of hepatitis, HTN, Depression and anxiety. PCCM asked to evaluate. She is a former smoker. She is also quite tremulous at this time.   Richardson Landry Minor ACNP Maryanna Shape PCCM Pager [phone] till 3 pm If no answer page [phone] 08/28/2014, 9:13 AM   I have personally obtained a history, examined the patient, evaluated laboratory and imaging results, formulated the assessment and plan and placed orders. CRITICAL CARE: The patient is critically ill with multiple organ systems failure and requires high complexity decision making for assessment and support, frequent evaluation and titration of therapies, application of advanced monitoring technologies and extensive interpretation of multiple databases. Critical Care Time devoted to patient care services described in this note is 50 minutes.   Baltazar Apo, MD, PhD 08/28/2014, 10:22 AM [facility] [phone] or if no answer [phone]

## 2014-08-28 NOTE — Progress Notes (Signed)
eLink Physician-Brief Progress Note Patient Name: MINIYA MIGUEZ DOB: 1946-11-06 MRN: [id number]   Date of Service  08/28/2014  HPI/Events of Note  68 y.o. female history of alcoholism drinks wine every night and has been drinking more than usual last 3 weeks with history of hypertension hyperlipidemia and diabetes mellitus type 2 was brought to the ER by patient's husband as patient has been drinking alcohol more than usual and wanted detox. Patient states she also has been having some abdominal discomfort mostly in the lower quadrants over the last 4 days with some nausea and vomiting and occasional diarrhea. In the ER patient was mildly hypotensive and sodium was 118. Patient alcohol level was high and liver function was elevated. Patient states last few days she has not been eating well because of vomiting.   eICU Interventions  68 yo female with EtOH abuse, hyponatremia, hypocholremia, hypotension  1. Electrolyte disturbance - secondary to GI losses (both upper and lower) compounded with EtOH abuse, possible beer potomania and malnutrition - recommend gentle volume resuscitation - monitor lytes q4hrs - monitor I\O  2. Hyponatremia - most likely secondary to GI losses - check serum and urine osm - got 2L NS overnight, getting another 500cc of NS - monitor Na - acute drop in Na, recently discharged with Na 136, now 118, can replete fairly quickly 62meq in 12-24hrs   Full consult note to follow.      Intervention Category Evaluation Type: New Patient Evaluation  Brysten Reister 08/28/2014, 6:16 AM

## 2014-08-28 NOTE — ED Notes (Signed)
IV team nurse at bedside. 

## 2014-08-29 ENCOUNTER — Inpatient Hospital Stay (HOSPITAL_COMMUNITY): Payer: 59

## 2014-08-29 DIAGNOSIS — F10939 Alcohol use, unspecified with withdrawal, unspecified: Secondary | ICD-10-CM

## 2014-08-29 DIAGNOSIS — F10239 Alcohol dependence with withdrawal, unspecified: Secondary | ICD-10-CM

## 2014-08-29 LAB — CBC
HCT: 27.1 % — ABNORMAL LOW (ref 36.0–46.0)
Hemoglobin: 9.8 g/dL — ABNORMAL LOW (ref 12.0–15.0)
MCH: 34.4 pg — ABNORMAL HIGH (ref 26.0–34.0)
MCHC: 36.2 g/dL — ABNORMAL HIGH (ref 30.0–36.0)
MCV: 95.1 fL (ref 78.0–100.0)
Platelets: 215 10*3/uL (ref 150–400)
RBC: 2.85 MIL/uL — ABNORMAL LOW (ref 3.87–5.11)
RDW: 15.4 % (ref 11.5–15.5)
WBC: 4.2 10*3/uL (ref 4.0–10.5)

## 2014-08-29 LAB — BASIC METABOLIC PANEL
Anion gap: 16 — ABNORMAL HIGH (ref 5–15)
BUN: 13 mg/dL (ref 6–23)
CO2: 20 mEq/L (ref 19–32)
Calcium: 7.6 mg/dL — ABNORMAL LOW (ref 8.4–10.5)
Chloride: 88 mEq/L — ABNORMAL LOW (ref 96–112)
Creatinine, Ser: 0.79 mg/dL (ref 0.50–1.10)
GFR calc Af Amer: >90 mL/min (ref >90)
GFR calc non Af Amer: 85 mL/min — ABNORMAL LOW (ref >90)
Glucose, Bld: 115 mg/dL — ABNORMAL HIGH (ref 70–99)
Potassium: 4 mEq/L (ref 3.7–5.3)
Sodium: 124 mEq/L — ABNORMAL LOW (ref 137–147)

## 2014-08-29 LAB — GLUCOSE, CAPILLARY
Glucose-Capillary: 151 mg/dL — ABNORMAL HIGH (ref 70–99)
Glucose-Capillary: 141 mg/dL — ABNORMAL HIGH (ref 70–99)
Glucose-Capillary: 126 mg/dL — ABNORMAL HIGH (ref 70–99)

## 2014-08-29 MED ORDER — LORAZEPAM 2 MG/ML IJ SOLN
1.0000 mg | INTRAMUSCULAR | Status: DC | PRN
Start: 2014-08-29 — End: 2014-08-31
  Administered 2014-08-29 – 2014-08-31 (×5): 1 mg via INTRAVENOUS
  Filled 2014-08-29 (×4): qty 1

## 2014-08-29 MED ORDER — ALBUTEROL SULFATE (2.5 MG/3ML) 0.083% IN NEBU: 2.5000 mg | INHALATION_SOLUTION | RESPIRATORY_TRACT | Status: DC | PRN

## 2014-08-29 MED ORDER — LORAZEPAM 2 MG/ML IJ SOLN
INTRAMUSCULAR | Status: AC
  Filled 2014-08-29: qty 1

## 2014-08-29 MED ORDER — LORAZEPAM 1 MG PO TABS
1.0000 mg | ORAL_TABLET | ORAL | Status: DC | PRN
  Administered 2014-08-29 – 2014-08-30 (×5): 1 mg via ORAL
  Filled 2014-08-29 (×5): qty 1

## 2014-08-29 NOTE — Progress Notes (Signed)
Patient told by nurse to stop messing around iv site throughout shift. Patient understoods nurses recommendations  each time but continued to play with IV site. Patient 24 gauge IV is now occluded and half out. Nurse unable to save IV site. Will attempt to place new IV. Patient is A&O X4. In no acute distress.

## 2014-08-29 NOTE — Progress Notes (Signed)
Patient unable to void urine while on bedside commode. Bladder scan showed 962 ml of urine in the bladder. Patient taught about urinary retention and i+o cath. Patient refused to relieve bladder with In & Out catheter.

## 2014-08-29 NOTE — Progress Notes (Signed)
Patient oxygen sats dropped to mid 80's while asleep. Patient placed on nasal cannula at 4 liters/min. Will continue to monitor.

## 2014-08-29 NOTE — Progress Notes (Signed)
Patient O2 saturation and respiratory status continues to diminish. Oxygen via nasal cannula continues to be titrated upwards while sats go down. Patient still alert and oriented x 4 and refusing intervention. Despite urinating 300 mls,  patient still has greater than 926mls in bladder via bladder scanner. Patient is becoming more jaundice, is now having plus 2 pitting edema in extremities. Patient refusing intervention. MD notified and aware. Patient educated about seriousness of condition and how not allowing interventions could be detrimental to patient health. Patient states, "I understand." Will continue to monitor.

## 2014-08-29 NOTE — Progress Notes (Signed)
Due to patients inability to urinate, despite numerous occasions on bedside commode and bedpan, RN bladder scanned patient. Upon scan more than 999 mls noted in bladder. This is the second time 900 or more have been noted in bladder with scan. Patient refusing in and out cath and foley. Patient educated on the risks and different injuries that can occur if these procedures are not done and bladder stays full. Patient stated, "I don't care.'' Patient Alert and oriented x4 and able to make own decisions.Charge nurse notified. MD notified. Will continue to monitor.

## 2014-08-29 NOTE — Progress Notes (Signed)
PULMONARY / CRITICAL CARE MEDICINE   Name: [name] MRN: [id number] DOB: [birth year]    ADMISSION DATE:  08/27/2014 CONSULTATION DATE:  9/16  REFERRING MD :  [facility]  CHIEF COMPLAINT:  Detox request  INITIAL PRESENTATION: Tremulous with ETOH wd   STUDIES:    SIGNIFICANT EVENTS: 9/26 admit with hyponatremia and ETOH wd  SUBJECTIVE:  Tremulous and somewhat uncomfortable but NAD  VITAL SIGNS: Temp:  [98.1 F (36.7 C)-99 F (37.2 C)] 98.1 F (36.7 C) (09/17 0800) Pulse Rate:  [60-101] 89 (09/17 1200) Resp:  [16-27] 27 (09/17 1200) BP: (93-184)/(37-81) 94/37 mmHg (09/17 1200) SpO2:  [90 %-99 %] 90 % (09/17 1200) Weight:  [76.4 kg (168 lb 6.9 oz)] 76.4 kg (168 lb 6.9 oz) (09/17 0346) HEMODYNAMICS:   VENTILATOR SETTINGS:   INTAKE / OUTPUT:  Intake/Output Summary (Last 24 hours) at 08/29/14 1211 Last data filed at 08/29/14 1200  Gross per 24 hour  Intake   1875 ml  Output    700 ml  Net   1175 ml    PHYSICAL EXAMINATION: General: Tremulous WF, uncomfortable, but stable  Neuro:  Alert but anxious,  HEENT:  No JVD LAN Cardiovascular:  HSR RRR Lungs:  CTA Abdomen:  Distended, non tender, + bs Musculoskeletal:  intact Skin:  warm  LABS:  CBC  Recent Labs Lab 08/28/14 0414 08/28/14 0630 08/29/14 0019  WBC 4.6 5.3 4.2  HGB 11.0* 10.4* 9.8*  HCT 31.4* 29.0* 27.1*  PLT 130* 128* 215   Coag's  Recent Labs Lab 08/28/14 0325  INR 1.05   BMET  Recent Labs Lab 08/28/14 1434 08/28/14 1839 08/29/14 0019  NA 121* 123* 124*  K 4.4 4.4 4.0  CL 85* 85* 88*  CO2 16* 20 20  BUN 12 12 13   CREATININE 0.76 0.68 0.79  GLUCOSE 87 114* 115*   Electrolytes  Recent Labs Lab 08/28/14 1434 08/28/14 1839 08/29/14 0019  CALCIUM 7.1* 7.9* 7.6*   Sepsis Markers  Recent Labs Lab 08/28/14 0630 08/28/14 0631  LATICACIDVEN  --  3.8*  PROCALCITON 0.15  --    ABG No results found for this basename: PHART, PCO2ART, PO2ART,  in the last 168 hours Liver  Enzymes  Recent Labs Lab 08/27/14 2235 08/28/14 0630  AST 425* 362*  ALT 227* 175*  ALKPHOS 377* 281*  BILITOT 2.1* 1.8*  ALBUMIN 3.9 2.9*   Cardiac Enzymes  Recent Labs Lab 08/28/14 0630  TROPONINI <0.30   Glucose  Recent Labs Lab 08/28/14 0842 08/28/14 1215 08/28/14 1617 08/28/14 2118 08/29/14 0824  GLUCAP 111* 119* 89 142* 151*    Imaging Ct Abdomen Pelvis W Contrast  08/28/2014   CLINICAL DATA:  Abdominal discomfort, nausea, vomiting, intermittent diarrhea  EXAM: CT ABDOMEN AND PELVIS WITH CONTRAST  TECHNIQUE: Multidetector CT imaging of the abdomen and pelvis was performed using the standard protocol following bolus administration of intravenous contrast.  CONTRAST:  137mL OMNIPAQUE IOHEXOL 300 MG/ML  SOLN  COMPARISON:  11/06/10  FINDINGS: Sagittal images of the spine shows degenerative changes thoracolumbar spine. Lung bases are unremarkable. There is chronic elevation of the right hemidiaphragm. Mild hepatomegaly again noted. Again noted significant fatty infiltration of the liver. No focal hepatic mass. No calcified gallstones are noted within gallbladder. The pancreas, spleen and adrenal glands are unremarkable. 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 Small amount of right perinephric fluid/ stranding. The kidneys are symmetrical in enhancement  Delayed renal images shows bilateral renal symmetrical excretion. No focal renal mass. Mild  atherosclerotic calcifications of abdominal aorta and iliac arteries. No small bowel obstruction. There is no pericecal inflammation. Nonspecific mild thickening of cecal wall surrounding the base of the appendix. The appendix appears normal. There is a distended urinary bladder with thickened wall. Mild cystitis cannot be excluded. Tiny amount of air within urinary bladder may be post instrumentation. The uterus and adnexa are unremarkable.  No small bowel obstruction. The terminal ileum is unremarkable. No ascites or free air. No  adenopathy. No inguinal adenopathy. No destructive bony lesions are noted within pelvis. Few diverticula are noted sigmoid colon. No evidence of acute diverticulitis.  IMPRESSION: 1. There is mild hepatomegaly. Significant fatty infiltration of the liver. 2. 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 Nonspecific mild right perinephric stranding/fluid. No hydronephrosis or hydroureter. Bilateral renal symmetrical excretion. 3. No small bowel or colonic obstruction. 4. There is a distended urinary bladder with mild thickening of the wall. Clinical correlation is necessary to exclude mild cystitis. Small amount of air within urinary bladder probable post instrumentation. 5. Normal appearing appendix.  No pericecal inflammation.   Electronically Signed   By: Lahoma Crocker M.D.   On: 08/28/2014 12:42   Dg Chest Port 1 View  08/28/2014   CLINICAL DATA:  Shortness of breath.  EXAM: PORTABLE CHEST - 1 VIEW  COMPARISON:  Chest radiograph performed 07/14/2014  FINDINGS: The lungs are hypoexpanded. Mild vascular congestion is noted. There is no evidence of pleural effusion or pneumothorax.  The cardiomediastinal silhouette is within normal limits. No acute osseous abnormalities are seen.  IMPRESSION: Lungs hypoexpanded. Mild vascular congestion noted. No focal consolidation seen.   Electronically Signed   By: Garald Balding M.D.   On: 08/28/2014 05:45     ASSESSMENT / PLAN:  PULMONARY OETT A: Former smoker Not normally on O2 on Bd's P:   O2 as needed BD's prn  CARDIOVASCULAR CVL A:  Hypotension Hx of hypertension P:  BP should stabilize with IVH plus stopping antihypertensives  Consider holding Bystolic for now.  RENAL A:   Hyponatremia, most likely secondary to ETOH abuse (na 118 on admit) Hypochloremia  P:   Hydration with gradual correction of NA/CL Close monitoring of electrolyte changes   GASTROINTESTINAL A:   No acute issue P:     HEMATOLOGIC A:   No acute issue P:     INFECTIOUS A:   No overt infectious process P:   BCx2 9/16>> UC Sputum 9/16 pip-tazo >> Consider d/c abx, ? Source infection  ENDOCRINE A:   DM   P:   SSI  NEUROLOGIC A:   ETOH withdrawal Depression Anxiety P:   RASS goal:1 Anxiolytics per IM. CIWA protocol Thiamine and folic acid If worse then will start precedex Admit to [facility] when medically stable   Richardson Landry Minor ACNP Maryanna Shape PCCM Pager [phone] till 3 pm If no answer page [phone] 08/29/2014, 12:11 PM   I have personally obtained a history, examined the patient, evaluated laboratory and imaging results, formulated the assessment and plan and placed orders.  CRITICAL CARE: The patient is critically ill with multiple organ systems failure and requires high complexity decision making for assessment and support, frequent evaluation and titration of therapies, application of advanced monitoring technologies and extensive interpretation of multiple databases. Critical Care Time devoted to patient care services described in this note is 45 minutes.   Baltazar Apo, MD, PhD 08/29/2014, 12:11 PM [employer] [facility] [phone] or if no answer [phone]

## 2014-08-29 NOTE — Progress Notes (Signed)
CSW received referral for New SNF.   CSW spoke to RN who stated that pt currently being uncooperative and refusing care being recommended. Per MD note, pt in ETOH withdrawal.   CSW to continue to follow and complete full psychosocial assessment when appropriate.   Alison Murray, MSW, [place] [employer] [phone]

## 2014-08-29 NOTE — Progress Notes (Signed)
Patient ID: [name], female   DOB: 1946/10/23, 68 y.o.   MRN: [id number]  [facility] PROGRESS NOTE  [name] LPF:[id number] DOB: 1946/08/06 DOA: 08/27/2014 PCP: [name], MD  Brief narrative: 68 y.o. female with HTN, DM type II, alcohol abuse, brought to [facility] ED by her husband for alcohol detoxication. He explained that pt has been drinking wine more than usual and has been having abdominal pain. Pt has been fairly poor historian since admission, not much verbal and refusing some medical care recommended.   In the ER patient was mildly hypotensive and sodium was 118. Patient alcohol level was high at 351 and liver function tests elevated at AST 425, ALT 227, ALP 377 and total bilirubin 2.1. UDS positive for benzos. Lipase level was 96. Patient has been admitted for further management of alcohol intoxication with impending delirium tremens and hyponatremia.   Assessment and Plan:    Principal Problem: Acute alcohol intoxication with delirium tremens / Alcohol abuse  Pt continues to be tremulous on physical exam. SHe does not require precedex drip.  CIWA score this am 12; change made to ativan (increased the frequency to every 4 hours as needed)  Continue to monitor in SDU for now  Appreciate PCCM following and their recommendations   Psychiatry consult requested and possible need for [facility] placement for alcohol abuse Active Problems: Abnormal LFT's / Alcoholic hepatitis  Likely secondary to alcohol abuse  AST 425, ALT 227, ALP 377 and total bilirubin 2.1. Continue to trend LFT's.  Obtain abdominal US. Acute pancreatitis   Lipase level 96 on admission. Follow up lipase in am.  Supportive care with IV fluids, antiemetics as needed   DIABETES MELLITUS, TYPE II   Continue Tradjenta and SSI   HYPERTENSION  BP soft this AM, SBP in 90's  Hold Nebivolol    Hyponatremia  Na slowly trending up since admission  Continue IVF and repeat BMP in AM  Sodium  ranges 121 -124   Anemia of chronic disease  Hg remains stable over the past 24 hours, 9.8  No signs of bleed  No current indications for transfusion   DVT prophylaxis  SCD's while pt is in hospital; stopped Lovenox due to thrombocytopenia   Code Status: Full Family Communication: Pt at bedside Disposition Plan: Home when medically stable   IV Access:   Peripheral IV Procedures and diagnostic studies:   Ct Abdomen Pelvis W Contrast  08/28/2014   There is mild hepatomegaly. Significant fatty infiltration of the liver. 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 Nonspecific mild right perinephric stranding/fluid. No hydronephrosis or hydroureter. Bilateral renal symmetrical excretion. 3. No small bowel or colonic obstruction. There is a distended urinary bladder with mild thickening of the wall. Clinical correlation is necessary to exclude mild cystitis. Small amount of air within urinary bladder probable post instrumentation.  CXR  08/28/2014  Lungs hypoexpanded. Mild vascular congestion noted. No focal consolidation seen.    Medical Consultants:   PCCM Other Consultants:   None Anti-Infectives:    None   Faye Ramsay, MD  [facility] Pager [phone]  If 7PM-7AM, please contact night-coverage [email or web address] Password [facility] 08/29/2014, 7:39 AM   LOS: 2 days   HPI/Subjective: No events overnight.   Objective: Filed Vitals:   08/29/14 0346 08/29/14 0347 08/29/14 0400 08/29/14 0600  BP:  184/81 146/75 93/44  Pulse:  86 73 67  Temp:   98.4 F (36.9 C)   TempSrc:   Oral   Resp:   19  18  Height:      Weight: 76.4 kg (168 lb 6.9 oz)     SpO2:   98% 94%    Intake/Output Summary (Last 24 hours) at 08/29/14 0739 Last data filed at 08/29/14 0700  Gross per 24 hour  Intake   2850 ml  Output    700 ml  Net   2150 ml    Exam:   General:  Pt is alert, not in acute distress  Cardiovascular: Regular rate and rhythm, S1/S2 appreciated   Respiratory: Clear to  auscultation bilaterally, no wheezing  Abdomen: Soft, non tender, non distended, bowel sounds present  Extremities: No edema, pulses DP and PT palpable bilaterally  Neuro: no focal neurologic deficits   Data Reviewed: Basic Metabolic Panel:  Recent Labs Lab 08/28/14 0630 08/28/14 1100 08/28/14 1434 08/28/14 1839 08/29/14 0019  NA 120* 120* 121* 123* 124*  K 4.1 3.9 4.4 4.4 4.0  CL 81* 82* 85* 85* 88*  CO2 13* 15* 16* 20 20  GLUCOSE 96 112* 87 114* 115*  BUN 12 12 12 12 13   CREATININE 0.87 0.77 0.76 0.68 0.79  CALCIUM 7.1* 7.4* 7.1* 7.9* 7.6*   Liver Function Tests:  Recent Labs Lab 08/27/14 2235 08/28/14 0630  AST 425* 362*  ALT 227* 175*  ALKPHOS 377* 281*  BILITOT 2.1* 1.8*  PROT 7.2 5.5*  ALBUMIN 3.9 2.9*    Recent Labs Lab 08/28/14 0630  LIPASE 96*   CBC:  Recent Labs Lab 08/27/14 2235 08/28/14 0414 08/28/14 0630 08/29/14 0019  WBC 3.3* 4.6 5.3 4.2  NEUTROABS  --   --  3.8  --   HGB 12.9 11.0* 10.4* 9.8*  HCT 36.4 31.4* 29.0* 27.1*  MCV 93.3 96.0 94.2 95.1  PLT 167 130* 128* 215   Cardiac Enzymes:  Recent Labs Lab 08/28/14 0630  CKTOTAL 246*  TROPONINI <0.30   CBG:  Recent Labs Lab 08/28/14 0842 08/28/14 1215 08/28/14 1617 08/28/14 2118  GLUCAP 111* 119* 89 142*    MRSA PCR SCREENING     Status: None   Collection Time    08/28/14  6:06 AM      Result Value Ref Range Status   MRSA by PCR NEGATIVE  NEGATIVE Final     Scheduled Meds: . enoxaparin injection  40 mg Subcutaneous Q24H  . escitalopram  20 mg Oral Daily  . folic acid  1 mg Oral Daily  . insulin aspart  0-9 Units Subcutaneous TID WC  . linagliptin  5 mg Oral Daily  . LORazepam  0-4 mg Intravenous Q6H  . nebivolol  5 mg Oral Daily  . thiamine  100 mg Oral Daily   Continuous Infusions: . sodium chloride 75 mL/hr at 08/29/14 [id number]

## 2014-08-30 DIAGNOSIS — F3289 Other specified depressive episodes: Secondary | ICD-10-CM

## 2014-08-30 DIAGNOSIS — F1020 Alcohol dependence, uncomplicated: Secondary | ICD-10-CM

## 2014-08-30 DIAGNOSIS — F329 Major depressive disorder, single episode, unspecified: Secondary | ICD-10-CM

## 2014-08-30 LAB — COMPREHENSIVE METABOLIC PANEL
ALT: 143 U/L — ABNORMAL HIGH (ref 0–35)
AST: 257 U/L — ABNORMAL HIGH (ref 0–37)
Albumin: 2.8 g/dL — ABNORMAL LOW (ref 3.5–5.2)
Alkaline Phosphatase: 305 U/L — ABNORMAL HIGH (ref 39–117)
Anion gap: 15 (ref 5–15)
BUN: 9 mg/dL (ref 6–23)
CO2: 22 mEq/L (ref 19–32)
Calcium: 8.2 mg/dL — ABNORMAL LOW (ref 8.4–10.5)
Chloride: 95 mEq/L — ABNORMAL LOW (ref 96–112)
Creatinine, Ser: 0.7 mg/dL (ref 0.50–1.10)
GFR calc Af Amer: >90 mL/min (ref >90)
GFR calc non Af Amer: 88 mL/min — ABNORMAL LOW (ref >90)
Glucose, Bld: 140 mg/dL — ABNORMAL HIGH (ref 70–99)
Potassium: 3.4 mEq/L — ABNORMAL LOW (ref 3.7–5.3)
Sodium: 132 mEq/L — ABNORMAL LOW (ref 137–147)
Total Bilirubin: 2 mg/dL — ABNORMAL HIGH (ref 0.3–1.2)
Total Protein: 5.6 g/dL — ABNORMAL LOW (ref 6.0–8.3)

## 2014-08-30 LAB — GLUCOSE, CAPILLARY
Glucose-Capillary: 120 mg/dL — ABNORMAL HIGH (ref 70–99)
Glucose-Capillary: 149 mg/dL — ABNORMAL HIGH (ref 70–99)
Glucose-Capillary: 202 mg/dL — ABNORMAL HIGH (ref 70–99)

## 2014-08-30 LAB — CBC
HCT: 27.1 % — ABNORMAL LOW (ref 36.0–46.0)
Hemoglobin: 9.4 g/dL — ABNORMAL LOW (ref 12.0–15.0)
MCH: 33.8 pg (ref 26.0–34.0)
MCHC: 34.7 g/dL (ref 30.0–36.0)
MCV: 97.5 fL (ref 78.0–100.0)
Platelets: 112 10*3/uL — ABNORMAL LOW (ref 150–400)
RBC: 2.78 MIL/uL — ABNORMAL LOW (ref 3.87–5.11)
RDW: 15.3 % (ref 11.5–15.5)
WBC: 3.5 10*3/uL — ABNORMAL LOW (ref 4.0–10.5)

## 2014-08-30 LAB — LIPASE, BLOOD: Lipase: 115 U/L — ABNORMAL HIGH (ref 11–59)

## 2014-08-30 LAB — PROCALCITONIN: Procalcitonin: 0.19 ng/mL

## 2014-08-30 MED ORDER — VITAMINS A & D EX OINT
TOPICAL_OINTMENT | CUTANEOUS | Status: AC
  Administered 2014-08-30: 1
  Filled 2014-08-30: qty 5

## 2014-08-30 MED ORDER — PROMETHAZINE HCL 25 MG/ML IJ SOLN
12.5000 mg | Freq: Four times a day (QID) | INTRAMUSCULAR | Status: DC | PRN
  Administered 2014-08-30: 12.5 mg via INTRAVENOUS
  Filled 2014-08-30: qty 1

## 2014-08-30 MED ORDER — PROMETHAZINE HCL 25 MG/ML IJ SOLN: 25.0000 mg | Freq: Four times a day (QID) | INTRAMUSCULAR | Status: DC | PRN

## 2014-08-30 NOTE — Consult Note (Signed)
[name] Face-to-Face Psychiatry Consult   Reason for Consult: capacity/ alcohol dependence Referring Physician:  Dr. Glennon Hamilton is an 68 y.o. female. Total Time spent with patient: 30 minutes  Assessment: AXIS I:  Alcohol Dependence, Alcohol Withdrawal, Depression NOS, consider alcohol inducecd AXIS II:  Deferred AXIS III:   Past Medical History  Diagnosis Date  . Alcohol abuse   . Diabetes mellitus   . Hypertension   . Fibromyalgia   . Depression   . Carotid artery stenosis   . Diverticulitis   . Hepatitis 08/28/2014   AXIS IV: limited support network, divorced, retired, chronic medical illlnesses AXIS V:  41-50 serious symptoms  Plan:  No evidence of imminent risk to self or others at present.   Patient does not meet criteria for psychiatric inpatient admission.  Subjective:   [name] is a 68 y.o. female patient admitted with request for alcohol detoxification.  HPI:  Patient is a 68 year old female, who has a long history of alcohol dependence. She states she has been drinking up to 2 (+) quarts of wine per day for at least several weeks. She states she started vomiting and having abdominal pain. Last drank 9/16. She was found to be hyponatremic and to have significant transaminase elevation. She was admitted to medical setting to manage for potential incipient DTs. Patient endorses a history of depression. She states she attempted suicide once " many many years ago". She states she has no history of psychosis. She describes a history of polysubstance dependence many years ago, but not recently. She describes a long history of alcohol dependence, and has been in Rehab settings ( to include [facility] here in [place] area) several times. She does have significant periods of sobriety, up to 6 years at one time. She states she is retired, lives alone, manages her own money. She is divorced, and a son committed suicide 10 years ago.  At this time patient  presents alert and attentive, and is oriented x 3. Sensorium does not seem to fluctuate. She does present dysphoric, irritable, and presents with significant tremors and psychomotor restlessness. She denies visual hallucinations or visual disturbances at this time. HPI Elements:   Severe alcohol dependence, with significant current Alcohol withdrawal  Past Psychiatric History: Past Medical History  Diagnosis Date  . Alcohol abuse   . Diabetes mellitus   . Hypertension   . Fibromyalgia   . Depression   . Carotid artery stenosis   . Diverticulitis   . Hepatitis 08/28/2014    reports that she quit smoking about 4 years ago. She has never used smokeless tobacco. She reports that she drinks alcohol. She reports that she does not use illicit drugs. Family History  Problem Relation Age of Onset  . Cancer Mother   . Heart disease Father   . Heart disease Sister      Living Arrangements: Alone (seperated from husband)   Abuse/Neglect [facility]) Physical Abuse: Denies Verbal Abuse: Denies Sexual Abuse: Denies Allergies:   Allergies  Allergen Reactions  . Latex Other (See Comments)    Break outs  . Lipitor [Atorvastatin Calcium] Other (See Comments)    Muscle aches  . Codeine Rash    And nausea   . Pentazocine Lactate Rash  . Sulfonamide Derivatives Rash     Objective: Blood pressure 164/90, pulse 103, temperature 98 F (36.7 C), temperature source Oral, resp. rate 21, height 5' 3"  (1.6 m), weight 77.7 kg (171 lb 4.8 oz), SpO2 100.00%.Body  mass index is 30.35 kg/(m^2). Results for orders placed during the hospital encounter of 08/27/14 (from the past 72 hour(s))  ACETAMINOPHEN LEVEL     Status: None   Collection Time    08/27/14 10:35 PM      Result Value Ref Range   Acetaminophen (Tylenol), Serum <15.0  10 - 30 ug/mL   Comment:            THERAPEUTIC CONCENTRATIONS VARY     SIGNIFICANTLY. A RANGE OF 10-30     ug/mL MAY BE AN EFFECTIVE     CONCENTRATION FOR MANY PATIENTS.      HOWEVER, SOME ARE BEST TREATED     AT CONCENTRATIONS OUTSIDE THIS     RANGE.     ACETAMINOPHEN CONCENTRATIONS     >150 ug/mL AT 4 HOURS AFTER     INGESTION AND >50 ug/mL AT 12     HOURS AFTER INGESTION ARE     OFTEN ASSOCIATED WITH TOXIC     REACTIONS.  CBC     Status: Abnormal   Collection Time    08/27/14 10:35 PM      Result Value Ref Range   WBC 3.3 (*) 4.0 - 10.5 K/uL   RBC 3.90  3.87 - 5.11 MIL/uL   Hemoglobin 12.9  12.0 - 15.0 g/dL   HCT 36.4  36.0 - 46.0 %   MCV 93.3  78.0 - 100.0 fL   MCH 33.1  26.0 - 34.0 pg   MCHC 35.4  30.0 - 36.0 g/dL   RDW 15.0  11.5 - 15.5 %   Platelets 167  150 - 400 K/uL  COMPREHENSIVE METABOLIC PANEL     Status: Abnormal   Collection Time    08/27/14 10:35 PM      Result Value Ref Range   Sodium 118 (*) 137 - 147 mEq/L   Comment: CRITICAL RESULT CALLED TO, READ BACK BY AND VERIFIED WITH:     SPOKE WITH NELSON,T RN 0010 [phone] COVINGTON,N   Potassium 4.7  3.7 - 5.3 mEq/L   Comment: REPEATED TO VERIFY   Chloride 70 (*) 96 - 112 mEq/L   Comment: REPEATED TO VERIFY   CO2 18 (*) 19 - 32 mEq/L   Comment: REPEATED TO VERIFY   Glucose, Bld 168 (*) 70 - 99 mg/dL   BUN 12  6 - 23 mg/dL   Creatinine, Ser 0.66  0.50 - 1.10 mg/dL   Calcium 8.9  8.4 - 10.5 mg/dL   Total Protein 7.2  6.0 - 8.3 g/dL   Albumin 3.9  3.5 - 5.2 g/dL   AST 425 (*) 0 - 37 U/L   Comment: SLIGHT HEMOLYSIS     HEMOLYSIS AT THIS LEVEL MAY AFFECT RESULT   ALT 227 (*) 0 - 35 U/L   Alkaline Phosphatase 377 (*) 39 - 117 U/L   Total Bilirubin 2.1 (*) 0.3 - 1.2 mg/dL   GFR calc non Af Amer >90  >90 mL/min   GFR calc Af Amer >90  >90 mL/min   Comment: (NOTE)     The eGFR has been calculated using the CKD EPI equation.     This calculation has not been validated in all clinical situations.     eGFR's persistently <90 mL/min signify possible Chronic Kidney     Disease.   Anion gap 30 (*) 5 - 15   Comment: REPEATED TO VERIFY  ETHANOL     Status: Abnormal   Collection Time     08/27/14  10:35 PM      Result Value Ref Range   Alcohol, Ethyl (B) 351 (*) 0 - 11 mg/dL   Comment:            LOWEST DETECTABLE LIMIT FOR     SERUM ALCOHOL IS 11 mg/dL     FOR MEDICAL PURPOSES ONLY  SALICYLATE LEVEL     Status: Abnormal   Collection Time    08/27/14 10:35 PM      Result Value Ref Range   Salicylate Lvl <1.6 (*) 2.8 - 20.0 mg/dL  BASIC METABOLIC PANEL     Status: Abnormal   Collection Time    08/28/14  3:25 AM      Result Value Ref Range   Sodium 115 (*) 137 - 147 mEq/L   Comment: REPEATED TO VERIFY     CRITICAL RESULT CALLED TO, READ BACK BY AND VERIFIED WITH:     SPOKE WITH ADDISON,K RN [phone] [id number] COVINGTON,N   Potassium 4.2  3.7 - 5.3 mEq/L   Comment: REPEATED TO VERIFY   Chloride 76 (*) 96 - 112 mEq/L   Comment: DELTA CHECK NOTED     REPEATED TO VERIFY   CO2 16 (*) 19 - 32 mEq/L   Comment: REPEATED TO VERIFY   Glucose, Bld 130 (*) 70 - 99 mg/dL   BUN 13  6 - 23 mg/dL   Creatinine, Ser 0.86  0.50 - 1.10 mg/dL   Calcium 7.6 (*) 8.4 - 10.5 mg/dL   GFR calc non Af Amer 69 (*) >90 mL/min   GFR calc Af Amer 80 (*) >90 mL/min   Comment: (NOTE)     The eGFR has been calculated using the CKD EPI equation.     This calculation has not been validated in all clinical situations.     eGFR's persistently <90 mL/min signify possible Chronic Kidney     Disease.   Anion gap 23 (*) 5 - 15   Comment: REPEATED TO VERIFY  PROTIME-INR     Status: None   Collection Time    08/28/14  3:25 AM      Result Value Ref Range   Prothrombin Time 13.7  11.6 - 15.2 seconds   INR 1.05  0.00 - 1.49  TSH     Status: None   Collection Time    08/28/14  3:26 AM      Result Value Ref Range   TSH 4.150  0.350 - 4.500 uIU/mL   Comment: Performed at [facility]  CBC     Status: Abnormal   Collection Time    08/28/14  4:14 AM      Result Value Ref Range   WBC 4.6  4.0 - 10.5 K/uL   RBC 3.27 (*) 3.87 - 5.11 MIL/uL   Hemoglobin 11.0 (*) 12.0 - 15.0 g/dL   HCT 31.4 (*) 36.0 -  46.0 %   MCV 96.0  78.0 - 100.0 fL   MCH 33.6  26.0 - 34.0 pg   MCHC 35.0  30.0 - 36.0 g/dL   RDW 15.2  11.5 - 15.5 %   Platelets 130 (*) 150 - 400 K/uL  HEPATITIS PANEL, ACUTE     Status: None   Collection Time    08/28/14  4:15 AM      Result Value Ref Range   Hepatitis B Surface Ag NEGATIVE  NEGATIVE   HCV Ab NEGATIVE  NEGATIVE   Hep A IgM NON REACTIVE  NON REACTIVE   Comment: (  NOTE)     Effective October 28, 2014, Hepatitis Acute Panel (test code [id number])     will be revised to automatically reflex to the Hepatitis C Viral RNA,     Quantitative, Real-Time PCR assay if the Hepatitis C antibody     screening result is Reactive. This action is being taken to ensure     that the CDC/USPSTF recommended HCV diagnostic algorithm with the     appropriate test reflex needed for accurate interpretation is     followed.   Hep B C IgM NON REACTIVE  NON REACTIVE   Comment: (NOTE)     High levels of Hepatitis B Core IgM antibody are detectable     during the acute stage of Hepatitis B. This antibody is used     to differentiate current from past HBV infection.     Performed at [facility] PCR SCREENING     Status: None   Collection Time    08/28/14  6:06 AM      Result Value Ref Range   MRSA by PCR NEGATIVE  NEGATIVE   Comment:            The GeneXpert MRSA Assay (FDA     approved for NASAL specimens     only), is one component of a     comprehensive MRSA colonization     surveillance program. It is not     intended to diagnose MRSA     infection nor to guide or     monitor treatment for     MRSA infections.  OSMOLALITY     Status: None   Collection Time    08/28/14  6:10 AM      Result Value Ref Range   Osmolality 290  275 - 300 mOsm/kg   Comment: Performed at [facility]  TROPONIN I     Status: None   Collection Time    08/28/14  6:30 AM      Result Value Ref Range   Troponin I <0.30  <0.30 ng/mL   Comment:            Due to the release kinetics of  cTnI,     a negative result within the first hours     of the onset of symptoms does not rule out     myocardial infarction with certainty.     If myocardial infarction is still suspected,     repeat the test at appropriate intervals.  CK     Status: Abnormal   Collection Time    08/28/14  6:30 AM      Result Value Ref Range   Total CK 246 (*) 7 - 177 U/L  CULTURE, BLOOD (ROUTINE X 2)     Status: None   Collection Time    08/28/14  6:30 AM      Result Value Ref Range   Specimen Description BLOOD RIGHT ARM     Special Requests BOTTLES DRAWN AEROBIC ONLY 5CC     Culture  Setup Time       Value: 08/28/2014 11:53     Performed at [facility]   Culture       Value:        BLOOD CULTURE RECEIVED NO GROWTH TO DATE CULTURE WILL BE HELD FOR 5 DAYS BEFORE ISSUING A FINAL NEGATIVE REPORT     Performed at [facility]   Report Status PENDING    CORTISOL  Status: None   Collection Time    08/28/14  6:30 AM      Result Value Ref Range   Cortisol, Plasma 25.7     Comment: (NOTE)     AM:  4.3 - 22.4 ug/dL     PM:  3.1 - 16.7 ug/dL     Performed at [facility] PANEL     Status: Abnormal   Collection Time    08/28/14  6:30 AM      Result Value Ref Range   Total Protein 5.5 (*) 6.0 - 8.3 g/dL   Albumin 2.9 (*) 3.5 - 5.2 g/dL   AST 362 (*) 0 - 37 U/L   ALT 175 (*) 0 - 35 U/L   Alkaline Phosphatase 281 (*) 39 - 117 U/L   Total Bilirubin 1.8 (*) 0.3 - 1.2 mg/dL   Bilirubin, Direct 1.2 (*) 0.0 - 0.3 mg/dL   Indirect Bilirubin 0.6  0.3 - 0.9 mg/dL  BASIC METABOLIC PANEL     Status: Abnormal   Collection Time    08/28/14  6:30 AM      Result Value Ref Range   Sodium 120 (*) 137 - 147 mEq/L   Comment: REPEATED TO VERIFY     CRITICAL RESULT CALLED TO, READ BACK BY AND VERIFIED WITH:     BURNHAM,S. RN AT 8299 08/28/14 BARFIELD,T   Potassium 4.1  3.7 - 5.3 mEq/L   Chloride 81 (*) 96 - 112 mEq/L   Comment: REPEATED TO VERIFY   CO2 13 (*) 19 -  32 mEq/L   Comment: REPEATED TO VERIFY   Glucose, Bld 96  70 - 99 mg/dL   BUN 12  6 - 23 mg/dL   Creatinine, Ser 0.87  0.50 - 1.10 mg/dL   Calcium 7.1 (*) 8.4 - 10.5 mg/dL   GFR calc non Af Amer 68 (*) >90 mL/min   GFR calc Af Amer 79 (*) >90 mL/min   Comment: (NOTE)     The eGFR has been calculated using the CKD EPI equation.     This calculation has not been validated in all clinical situations.     eGFR's persistently <90 mL/min signify possible Chronic Kidney     Disease.   Anion gap 26 (*) 5 - 15   Comment: REPEATED TO VERIFY  CBC WITH DIFFERENTIAL     Status: Abnormal   Collection Time    08/28/14  6:30 AM      Result Value Ref Range   WBC 5.3  4.0 - 10.5 K/uL   RBC 3.08 (*) 3.87 - 5.11 MIL/uL   Hemoglobin 10.4 (*) 12.0 - 15.0 g/dL   HCT 29.0 (*) 36.0 - 46.0 %   MCV 94.2  78.0 - 100.0 fL   MCH 33.8  26.0 - 34.0 pg   MCHC 35.9  30.0 - 36.0 g/dL   RDW 15.2  11.5 - 15.5 %   Platelets 128 (*) 150 - 400 K/uL   Neutrophils Relative % 73  43 - 77 %   Neutro Abs 3.8  1.7 - 7.7 K/uL   Lymphocytes Relative 19  12 - 46 %   Lymphs Abs 1.0  0.7 - 4.0 K/uL   Monocytes Relative 8  3 - 12 %   Monocytes Absolute 0.4  0.1 - 1.0 K/uL   Eosinophils Relative 0  0 - 5 %   Eosinophils Absolute 0.0  0.0 - 0.7 K/uL   Basophils Relative 0  0 -  1 %   Basophils Absolute 0.0  0.0 - 0.1 K/uL  LIPASE, BLOOD     Status: Abnormal   Collection Time    08/28/14  6:30 AM      Result Value Ref Range   Lipase 96 (*) 11 - 59 U/L  PROCALCITONIN     Status: None   Collection Time    08/28/14  6:30 AM      Result Value Ref Range   Procalcitonin 0.15     Comment:            Interpretation:     PCT (Procalcitonin) <= 0.5 ng/mL:     Systemic infection (sepsis) is not likely.     Local bacterial infection is possible.     (NOTE)             ICU PCT Algorithm               Non ICU PCT Algorithm        ----------------------------     ------------------------------             PCT < 0.25 ng/mL                  PCT < 0.1 ng/mL         Stopping of antibiotics            Stopping of antibiotics           strongly encouraged.               strongly encouraged.        ----------------------------     ------------------------------           PCT level decrease by               PCT < 0.25 ng/mL           >= 80% from peak PCT           OR PCT 0.25 - 0.5 ng/mL          Stopping of antibiotics                                                 encouraged.         Stopping of antibiotics               encouraged.        ----------------------------     ------------------------------           PCT level decrease by              PCT >= 0.25 ng/mL           < 80% from peak PCT            AND PCT >= 0.5 ng/mL            Continuing antibiotics                                                  encouraged.           Continuing antibiotics                encouraged.        ----------------------------     ------------------------------  PCT level increase compared          PCT > 0.5 ng/mL             with peak PCT AND              PCT >= 0.5 ng/mL             Escalation of antibiotics                                              strongly encouraged.          Escalation of antibiotics            strongly encouraged.  LACTIC ACID, PLASMA     Status: Abnormal   Collection Time    08/28/14  6:31 AM      Result Value Ref Range   Lactic Acid, Venous 3.8 (*) 0.5 - 2.2 mmol/L  CULTURE, BLOOD (ROUTINE X 2)     Status: None   Collection Time    08/28/14  6:40 AM      Result Value Ref Range   Specimen Description BLOOD RIGHT ARM     Special Requests BOTTLES DRAWN AEROBIC ONLY 5CC     Culture  Setup Time       Value: 08/28/2014 11:53     Performed at [facility]   Culture       Value:        BLOOD CULTURE RECEIVED NO GROWTH TO DATE CULTURE WILL BE HELD FOR 5 DAYS BEFORE ISSUING A FINAL NEGATIVE REPORT     Performed at [facility]   Report Status PENDING    GLUCOSE, CAPILLARY      Status: Abnormal   Collection Time    08/28/14  8:42 AM      Result Value Ref Range   Glucose-Capillary 111 (*) 70 - 99 mg/dL   Comment 1 Documented in Chart     Comment 2 Notify RN    BASIC METABOLIC PANEL     Status: Abnormal   Collection Time    08/28/14 11:00 AM      Result Value Ref Range   Sodium 120 (*) 137 - 147 mEq/L   Comment: REPEATED TO VERIFY     CRITICAL RESULT CALLED TO, READ BACK BY AND VERIFIED WITH:     BURNHAM,S @ 1217 ON [id number] BY POTEAT,S   Potassium 3.9  3.7 - 5.3 mEq/L   Comment: REPEATED TO VERIFY   Chloride 82 (*) 96 - 112 mEq/L   Comment: REPEATED TO VERIFY   CO2 15 (*) 19 - 32 mEq/L   Comment: REPEATED TO VERIFY   Glucose, Bld 112 (*) 70 - 99 mg/dL   BUN 12  6 - 23 mg/dL   Creatinine, Ser 0.77  0.50 - 1.10 mg/dL   Calcium 7.4 (*) 8.4 - 10.5 mg/dL   GFR calc non Af Amer 86 (*) >90 mL/min   GFR calc Af Amer >90  >90 mL/min   Comment: (NOTE)     The eGFR has been calculated using the CKD EPI equation.     This calculation has not been validated in all clinical situations.     eGFR's persistently <90 mL/min signify possible Chronic Kidney     Disease.   Anion gap 23 (*) 5 - 15   Comment: REPEATED TO VERIFY  GLUCOSE, CAPILLARY     Status: Abnormal   Collection Time    08/28/14 12:15 PM      Result Value Ref Range   Glucose-Capillary 119 (*) 70 - 99 mg/dL   Comment 1 Documented in Chart     Comment 2 Notify RN    URINE RAPID DRUG SCREEN (HOSP PERFORMED)     Status: Abnormal   Collection Time    08/28/14 12:45 PM      Result Value Ref Range   Opiates NONE DETECTED  NONE DETECTED   Cocaine NONE DETECTED  NONE DETECTED   Benzodiazepines POSITIVE (*) NONE DETECTED   Amphetamines NONE DETECTED  NONE DETECTED   Tetrahydrocannabinol NONE DETECTED  NONE DETECTED   Barbiturates NONE DETECTED  NONE DETECTED   Comment:            DRUG SCREEN FOR MEDICAL PURPOSES     ONLY.  IF CONFIRMATION IS NEEDED     FOR ANY PURPOSE, NOTIFY LAB     WITHIN 5 DAYS.                 LOWEST DETECTABLE LIMITS     FOR URINE DRUG SCREEN     Drug Class       Cutoff (ng/mL)     Amphetamine      1000     Barbiturate      200     Benzodiazepine   759     Tricyclics       163     Opiates          300     Cocaine          300     THC              50  SODIUM, URINE, RANDOM     Status: None   Collection Time    08/28/14 12:45 PM      Result Value Ref Range   Sodium, Ur 33     Comment: Performed at [facility], URINE     Status: Abnormal   Collection Time    08/28/14 12:45 PM      Result Value Ref Range   Osmolality, Ur 325 (*) 390 - 1090 mOsm/kg   Comment: Performed at [facility]     Status: Abnormal   Collection Time    08/28/14 12:45 PM      Result Value Ref Range   Color, Urine YELLOW  YELLOW   APPearance CLOUDY (*) CLEAR   Specific Gravity, Urine 1.016  1.005 - 1.030   pH 7.5  5.0 - 8.0   Glucose, UA NEGATIVE  NEGATIVE mg/dL   Hgb urine dipstick SMALL (*) NEGATIVE   Bilirubin Urine NEGATIVE  NEGATIVE   Ketones, ur 15 (*) NEGATIVE mg/dL   Protein, ur 30 (*) NEGATIVE mg/dL   Urobilinogen, UA 1.0  0.0 - 1.0 mg/dL   Nitrite POSITIVE (*) NEGATIVE   Leukocytes, UA SMALL (*) NEGATIVE   WBC, UA 7-10  <3 WBC/hpf   RBC / HPF 0-2  <3 RBC/hpf   Bacteria, UA MANY (*) RARE   Casts HYALINE CASTS (*) NEGATIVE  BASIC METABOLIC PANEL     Status: Abnormal   Collection Time    08/28/14  2:34 PM      Result Value Ref Range   Sodium 121 (*) 137 - 147 mEq/L   Comment: CRITICAL RESULT CALLED TO, READ BACK  BY AND VERIFIED WITH:     BURNHAN,S. RN @ 7564 ON 08/28/14 BY MCCOY, N.    Potassium 4.4  3.7 - 5.3 mEq/L   Chloride 85 (*) 96 - 112 mEq/L   CO2 16 (*) 19 - 32 mEq/L   Glucose, Bld 87  70 - 99 mg/dL   BUN 12  6 - 23 mg/dL   Creatinine, Ser 0.76  0.50 - 1.10 mg/dL   Calcium 7.1 (*) 8.4 - 10.5 mg/dL   GFR calc non Af Amer 86 (*) >90 mL/min   GFR calc Af Amer >90  >90 mL/min   Comment: (NOTE)     The  eGFR has been calculated using the CKD EPI equation.     This calculation has not been validated in all clinical situations.     eGFR's persistently <90 mL/min signify possible Chronic Kidney     Disease.   Anion gap 20 (*) 5 - 15  GLUCOSE, CAPILLARY     Status: None   Collection Time    08/28/14  4:17 PM      Result Value Ref Range   Glucose-Capillary 89  70 - 99 mg/dL   Comment 1 Documented in Chart     Comment 2 Notify RN    BASIC METABOLIC PANEL     Status: Abnormal   Collection Time    08/28/14  6:39 PM      Result Value Ref Range   Sodium 123 (*) 137 - 147 mEq/L   Potassium 4.4  3.7 - 5.3 mEq/L   Chloride 85 (*) 96 - 112 mEq/L   CO2 20  19 - 32 mEq/L   Glucose, Bld 114 (*) 70 - 99 mg/dL   BUN 12  6 - 23 mg/dL   Creatinine, Ser 0.68  0.50 - 1.10 mg/dL   Calcium 7.9 (*) 8.4 - 10.5 mg/dL   GFR calc non Af Amer 89 (*) >90 mL/min   GFR calc Af Amer >90  >90 mL/min   Comment: (NOTE)     The eGFR has been calculated using the CKD EPI equation.     This calculation has not been validated in all clinical situations.     eGFR's persistently <90 mL/min signify possible Chronic Kidney     Disease.   Anion gap 18 (*) 5 - 15  GLUCOSE, CAPILLARY     Status: Abnormal   Collection Time    08/28/14  9:18 PM      Result Value Ref Range   Glucose-Capillary 142 (*) 70 - 99 mg/dL   Comment 1 Documented in Chart     Comment 2 Notify RN    CBC     Status: Abnormal   Collection Time    08/29/14 12:19 AM      Result Value Ref Range   WBC 4.2  4.0 - 10.5 K/uL   RBC 2.85 (*) 3.87 - 5.11 MIL/uL   Hemoglobin 9.8 (*) 12.0 - 15.0 g/dL   HCT 27.1 (*) 36.0 - 46.0 %   MCV 95.1  78.0 - 100.0 fL   MCH 34.4 (*) 26.0 - 34.0 pg   MCHC 36.2 (*) 30.0 - 36.0 g/dL   RDW 15.4  11.5 - 15.5 %   Platelets 215  150 - 400 K/uL   Comment: DELTA CHECK NOTED     REPEATED TO VERIFY  BASIC METABOLIC PANEL     Status: Abnormal   Collection Time    08/29/14 12:19 AM  Result Value Ref Range   Sodium 124  (*) 137 - 147 mEq/L   Potassium 4.0  3.7 - 5.3 mEq/L   Chloride 88 (*) 96 - 112 mEq/L   CO2 20  19 - 32 mEq/L   Glucose, Bld 115 (*) 70 - 99 mg/dL   BUN 13  6 - 23 mg/dL   Creatinine, Ser 0.79  0.50 - 1.10 mg/dL   Calcium 7.6 (*) 8.4 - 10.5 mg/dL   GFR calc non Af Amer 85 (*) >90 mL/min   GFR calc Af Amer >90  >90 mL/min   Comment: (NOTE)     The eGFR has been calculated using the CKD EPI equation.     This calculation has not been validated in all clinical situations.     eGFR's persistently <90 mL/min signify possible Chronic Kidney     Disease.   Anion gap 16 (*) 5 - 15  GLUCOSE, CAPILLARY     Status: Abnormal   Collection Time    08/29/14  8:24 AM      Result Value Ref Range   Glucose-Capillary 151 (*) 70 - 99 mg/dL   Comment 1 Documented in Chart     Comment 2 Notify RN    GLUCOSE, CAPILLARY     Status: Abnormal   Collection Time    08/29/14 12:02 PM      Result Value Ref Range   Glucose-Capillary 141 (*) 70 - 99 mg/dL   Comment 1 Documented in Chart     Comment 2 Notify RN    GLUCOSE, CAPILLARY     Status: Abnormal   Collection Time    08/29/14  4:10 PM      Result Value Ref Range   Glucose-Capillary 126 (*) 70 - 99 mg/dL   Comment 1 Documented in Chart    PROCALCITONIN     Status: None   Collection Time    08/30/14  3:45 AM      Result Value Ref Range   Procalcitonin 0.19     Comment:            Interpretation:     PCT (Procalcitonin) <= 0.5 ng/mL:     Systemic infection (sepsis) is not likely.     Local bacterial infection is possible.     (NOTE)             ICU PCT Algorithm               Non ICU PCT Algorithm        ----------------------------     ------------------------------             PCT < 0.25 ng/mL                 PCT < 0.1 ng/mL         Stopping of antibiotics            Stopping of antibiotics           strongly encouraged.               strongly encouraged.        ----------------------------     ------------------------------           PCT  level decrease by               PCT < 0.25 ng/mL           >= 80% from peak PCT  OR PCT 0.25 - 0.5 ng/mL          Stopping of antibiotics                                                 encouraged.         Stopping of antibiotics               encouraged.        ----------------------------     ------------------------------           PCT level decrease by              PCT >= 0.25 ng/mL           < 80% from peak PCT            AND PCT >= 0.5 ng/mL            Continuing antibiotics                                                  encouraged.           Continuing antibiotics                encouraged.        ----------------------------     ------------------------------         PCT level increase compared          PCT > 0.5 ng/mL             with peak PCT AND              PCT >= 0.5 ng/mL             Escalation of antibiotics                                              strongly encouraged.          Escalation of antibiotics            strongly encouraged.  COMPREHENSIVE METABOLIC PANEL     Status: Abnormal   Collection Time    08/30/14  3:45 AM      Result Value Ref Range   Sodium 132 (*) 137 - 147 mEq/L   Comment: DELTA CHECK NOTED     REPEATED TO VERIFY   Potassium 3.4 (*) 3.7 - 5.3 mEq/L   Chloride 95 (*) 96 - 112 mEq/L   CO2 22  19 - 32 mEq/L   Glucose, Bld 140 (*) 70 - 99 mg/dL   BUN 9  6 - 23 mg/dL   Creatinine, Ser 0.70  0.50 - 1.10 mg/dL   Calcium 8.2 (*) 8.4 - 10.5 mg/dL   Total Protein 5.6 (*) 6.0 - 8.3 g/dL   Albumin 2.8 (*) 3.5 - 5.2 g/dL   AST 257 (*) 0 - 37 U/L   ALT 143 (*) 0 - 35 U/L   Alkaline Phosphatase 305 (*) 39 - 117 U/L   Total Bilirubin 2.0 (*) 0.3 - 1.2 mg/dL   GFR calc non Af Amer 88 (*) >  90 mL/min   GFR calc Af Amer >90  >90 mL/min   Comment: (NOTE)     The eGFR has been calculated using the CKD EPI equation.     This calculation has not been validated in all clinical situations.     eGFR's persistently <90 mL/min signify possible  Chronic Kidney     Disease.   Anion gap 15  5 - 15  CBC     Status: Abnormal   Collection Time    08/30/14  3:45 AM      Result Value Ref Range   WBC 3.5 (*) 4.0 - 10.5 K/uL   RBC 2.78 (*) 3.87 - 5.11 MIL/uL   Hemoglobin 9.4 (*) 12.0 - 15.0 g/dL   HCT 27.1 (*) 36.0 - 46.0 %   MCV 97.5  78.0 - 100.0 fL   MCH 33.8  26.0 - 34.0 pg   MCHC 34.7  30.0 - 36.0 g/dL   RDW 15.3  11.5 - 15.5 %   Platelets 112 (*) 150 - 400 K/uL   Comment: SPECIMEN CHECKED FOR CLOTS     REPEATED TO VERIFY     DELTA CHECK NOTED     PLATELET COUNT CONFIRMED BY SMEAR  LIPASE, BLOOD     Status: Abnormal   Collection Time    08/30/14  3:45 AM      Result Value Ref Range   Lipase 115 (*) 11 - 59 U/L  GLUCOSE, CAPILLARY     Status: Abnormal   Collection Time    08/30/14  8:15 AM      Result Value Ref Range   Glucose-Capillary 120 (*) 70 - 99 mg/dL   Comment 1 Documented in Chart     Comment 2 Notify RN    GLUCOSE, CAPILLARY     Status: Abnormal   Collection Time    08/30/14 12:30 PM      Result Value Ref Range   Glucose-Capillary 149 (*) 70 - 99 mg/dL   Comment 1 Documented in Chart     Comment 2 Notify RN     Labs are reviewed and are pertinent for elevated transaminases, elevated lipase, thrombocytopenia, BAL 351 upon admission.  Current Facility-Administered Medications  Medication Dose Route Frequency Provider Last Rate Last Dose  . 0.9 %  sodium chloride infusion   Intravenous Continuous Grace Bushy Minor, NP 75 mL/hr at 08/30/14 0933    . albuterol (PROVENTIL) (2.5 MG/3ML) 0.083% nebulizer solution 2.5 mg  2.5 mg Nebulization Q4H PRN Collene Gobble, MD      . antiseptic oral rinse (CPC / CETYLPYRIDINIUM CHLORIDE 0.05%) solution 7 mL  7 mL Mouth Rinse BID Rise Patience, MD   7 mL at 08/30/14 0940  . escitalopram (LEXAPRO) tablet 20 mg  20 mg Oral Daily Rise Patience, MD   20 mg at 08/30/14 0916  . folic acid (FOLVITE) tablet 1 mg  1 mg Oral Daily Rise Patience, MD   1 mg at 08/30/14  0916  . insulin aspart (novoLOG) injection 0-9 Units  0-9 Units Subcutaneous TID WC Rise Patience, MD   1 Units at 08/30/14 1330  . linagliptin (TRADJENTA) tablet 5 mg  5 mg Oral Daily Rise Patience, MD   5 mg at 08/30/14 0916  . LORazepam (ATIVAN) injection 1 mg  1 mg Intravenous Q4H PRN Theodis Blaze, MD   1 mg at 08/29/14 1000   Or  . LORazepam (ATIVAN) tablet 1 mg  1 mg  Oral Q4H PRN Theodis Blaze, MD   1 mg at 08/30/14 0350  . multivitamin with minerals tablet 1 tablet  1 tablet Oral Daily Rise Patience, MD   1 tablet at 08/30/14 [phone]  . ondansetron (ZOFRAN) tablet 4 mg  4 mg Oral Q6H PRN Rise Patience, MD   4 mg at 08/30/14 0933   Or  . ondansetron (ZOFRAN) injection 4 mg  4 mg Intravenous Q6H PRN Rise Patience, MD   4 mg at 08/29/14 1131  . sodium chloride 0.9 % injection 3 mL  3 mL Intravenous Q12H Rise Patience, MD   3 mL at 08/30/14 0917  . thiamine (VITAMIN B-1) tablet 100 mg  100 mg Oral Daily Rise Patience, MD   100 mg at 08/30/14 1829   Or  . thiamine (B-1) injection 100 mg  100 mg Intravenous Daily Rise Patience, MD        Psychiatric Specialty Exam:     Blood pressure 164/90, pulse 103, temperature 98 F (36.7 C), temperature source Oral, resp. rate 21, height 5' 3"  (1.6 m), weight 77.7 kg (171 lb 4.8 oz), SpO2 100.00%.Body mass index is 30.35 kg/(m^2).  General Appearance: Fairly Groomed  Engineer, water::  Good  Speech:  Normal Rate- answers only some questions, presents with hypoacusia.  Volume:  Normal  Mood:  Dysphoric  Affect:  Restricted and irritable  Thought Process:  Goal Directed and Linear  Orientation:  Other:  alert and attentive, oriented to person, place and time.   Thought Content:  denies hallucinations, no delusions, does not appear internally preoccupied   Suicidal Thoughts:  No- denies any suicidal ideations  Homicidal Thoughts:  No  Memory:  fair  Judgement:  Fair  Insight:  Fair  Psychomotor Activity:   Restlessness  Concentration:  Fair  Recall:  [place] of Knowledge:Good  Language: Good  Akathisia:  Negative  Handed:  Right  AIMS (if indicated):     Assets:  Desire for Improvement Resilience  Sleep:      Musculoskeletal: Strength & Muscle Tone: restless Gait & Station: gait not examined, patient in bed Patient leans: N/A  Treatment Plan Summary: 1. At this time patient does not meet criteria for inpatient psychiatric admission. 2. Patient continues to present with significant symptoms of alcohol withdrawal, to include psychomotor restlessness, tremulousness, tachycardia, but has no current symptoms of delirium.  3.Agree with Lorazepam for management of Withdrawal. Would consider, based on ongoing symptoms in spite of PRN  Orders as per CIWA protocol , adding standing Ativan ( in addition to PRNs as  needed) .  Suggest ATIVAN 1 mgr q 6-8 hours ( hold if sedated or sleeping), to be tapered over the next 3-4 days.   4. Agree with Lexapro 20 mgrs PO QDAY 5. Insofar as capacity to make decisions regarding disposition plans, I do think that patient has current capacity to make decisions regarding the above. ( She has understanding of her illness, risks if relapses, treatment modalilties available to her, and is  Attentive and oriented x 3) 5. Would continue to encourage her to go to an inpatient Rehab after discharge as the optimal disposition plan. May also consider referring to [facility] ( IOP)    Greg Eckrich, [facility] 08/30/2014 2:54 PM

## 2014-08-30 NOTE — Progress Notes (Signed)
PULMONARY / CRITICAL CARE MEDICINE   Name: [name] MRN: [id number] DOB: [birth year]    ADMISSION DATE:  08/27/2014 CONSULTATION DATE:  9/16  REFERRING MD :  [facility]  CHIEF COMPLAINT:  Detox request  INITIAL PRESENTATION: Tremulous with ETOH wd   STUDIES:   SIGNIFICANT EVENTS: 9/26 admit with hyponatremia and ETOH wd  SUBJECTIVE:  Continues to have some tremor but well-oriented, no evidence delirium  VITAL SIGNS: Temp:  [98.5 F (36.9 C)-99.6 F (37.6 C)] 98.5 F (36.9 C) (09/18 0800) Pulse Rate:  [74-107] 107 (09/18 0900) Resp:  [10-27] 10 (09/18 0900) BP: (94-147)/(37-94) 147/67 mmHg (09/18 0800) SpO2:  [90 %-100 %] 99 % (09/18 0900) Weight:  [77.7 kg (171 lb 4.8 oz)] 77.7 kg (171 lb 4.8 oz) (09/17 2352) HEMODYNAMICS:   VENTILATOR SETTINGS:   INTAKE / OUTPUT:  Intake/Output Summary (Last 24 hours) at 08/30/14 1052 Last data filed at 08/30/14 0800  Gross per 24 hour  Intake 1900.5 ml  Output   2125 ml  Net -224.5 ml    PHYSICAL EXAMINATION: General: Tremulous WF, uncomfortable, but stable  Neuro:  Alert but anxious,  HEENT:  No JVD LAN Cardiovascular:  HSR RRR Lungs:  CTA Abdomen:  Distended, non tender, + bs Musculoskeletal:  intact Skin:  warm  LABS:  CBC  Recent Labs Lab 08/28/14 0630 08/29/14 0019 08/30/14 0345  WBC 5.3 4.2 3.5*  HGB 10.4* 9.8* 9.4*  HCT 29.0* 27.1* 27.1*  PLT 128* 215 112*   Coag's  Recent Labs Lab 08/28/14 0325  INR 1.05   BMET  Recent Labs Lab 08/28/14 1839 08/29/14 0019 08/30/14 0345  NA 123* 124* 132*  K 4.4 4.0 3.4*  CL 85* 88* 95*  CO2 20 20 22   BUN 12 13 9   CREATININE 0.68 0.79 0.70  GLUCOSE 114* 115* 140*   Electrolytes  Recent Labs Lab 08/28/14 1839 08/29/14 0019 08/30/14 0345  CALCIUM 7.9* 7.6* 8.2*   Sepsis Markers  Recent Labs Lab 08/28/14 0630 08/28/14 0631 08/30/14 0345  LATICACIDVEN  --  3.8*  --   PROCALCITON 0.15  --  0.19   ABG No results found for this basename:  PHART, PCO2ART, PO2ART,  in the last 168 hours Liver Enzymes  Recent Labs Lab 08/27/14 2235 08/28/14 0630 08/30/14 0345  AST 425* 362* 257*  ALT 227* 175* 143*  ALKPHOS 377* 281* 305*  BILITOT 2.1* 1.8* 2.0*  ALBUMIN 3.9 2.9* 2.8*   Cardiac Enzymes  Recent Labs Lab 08/28/14 0630  TROPONINI <0.30   Glucose  Recent Labs Lab 08/28/14 1617 08/28/14 2118 08/29/14 0824 08/29/14 1202 08/29/14 1610 08/30/14 0815  GLUCAP 89 142* 151* 141* 126* 120*    Imaging US Abdomen Limited Ruq  08/30/2014   CLINICAL DATA:  Abnormal liver function tests  EXAM: US ABDOMEN LIMITED - RIGHT UPPER QUADRANT  COMPARISON:  None.  FINDINGS: Gallbladder:  No gallstones or wall thickening visualized. No sonographic Murphy sign noted.  Common bile duct:  Diameter: 4 mm  Liver:  Hepatomegaly noted with diffusely increased echogenicity suggesting steatosis.  IMPRESSION: Hepatic steatosis without focal abnormality or acute finding.   Electronically Signed   By: Conchita Paris M.D.   On: 08/30/2014 07:10     ASSESSMENT / PLAN:  PULMONARY OETT A: Former smoker Not normally on O2 on Bd's P:   O2 as needed BD's prn  CARDIOVASCULAR CVL A:  Hypotension, resolved Hx of hypertension P:  Add back home BP regimen when not receiving so much  prn sedation  RENAL A:   Hyponatremia, most likely secondary to ETOH abuse (na 118 on admit), improving Hypochloremia  P:   Hydration with gradual correction of NA/CL Close monitoring of electrolyte changes   GASTROINTESTINAL A:   No acute issue P:     HEMATOLOGIC A:   No acute issue P:    INFECTIOUS A:   No overt infectious process P:   BCx2 9/16>> UC Sputum 9/16 pip-tazo >> 9/17  ENDOCRINE A:   DM   P:   SSI  NEUROLOGIC A:   ETOH withdrawal Depression Anxiety P:   RASS goal:1 Anxiolytics per IM. CIWA protocol Thiamine and folic acid If worse then will start precedex, but no indication at this time.  Admit to [facility] when  medically stable   I have personally obtained a history, examined the patient, evaluated laboratory and imaging results, formulated the assessment and plan and placed orders.  PCCM will check on her on Monday if she is still in SDU. Please call over the weekend if we can assist.   Baltazar Apo, MD, PhD 08/30/2014, 10:52 AM [facility] [phone] or if no answer [phone]

## 2014-08-30 NOTE — Progress Notes (Addendum)
Pt having urinary frequency/urgency, asking for bedpan frequently and having several little urine incontinence episodes. Bladder scan showed that pt had >930ml of urine in her bladder, in and out cath completed per protocol and 1600 ml of amber colored urine was returned. Pt tolerated procedure ok, and expressed relieve from bladder fullness. Pt perineum  is bruised,looked  purplish but intact, the bruised area extends from her vagina, rectum to area around the lower portion of her buttocks. Pt states that she fell at home but was unable to tell me what part of her body took the impact. Pt currently in no acute distress and  will continue to be monitored.

## 2014-08-30 NOTE — Progress Notes (Signed)
Patient ID: [name], female   DOB: 1946/07/14, 68 y.o.   MRN: [id number]  [facility] PROGRESS NOTE  [name] WER:[id number] DOB: [birth year] DOA: 08/27/2014 PCP: [name], MD  Brief narrative:  68 y.o. female with HTN, DM type II, alcohol abuse, brought to [facility] ED by her husband for alcohol detoxication. He explained that pt has been drinking wine more than usual and has been having abdominal pain. Pt has been fairly poor historian since admission, not much verbal and refusing some medical care recommended.   In the ER patient was mildly hypotensive and sodium was 118. Patient alcohol level was high at 351 and liver function tests elevated at AST 425, ALT 227, ALP 377 and total bilirubin 2.1. UDS positive for benzos. Lipase level was 96. Patient has been admitted for further management of alcohol intoxication with impending delirium tremens and hyponatremia.   Assessment and Plan:   Principal Problem:  Acute alcohol intoxication with delirium tremens / Alcohol abuse  Pt continues to be tremulous on physical exam. She does not require precedex drip.  Continue CIWA monitoring  Appreciate PCCM following and their recommendations  Psychiatry consult requested and possible need for [facility] placement for alcohol abuse Active Problems:  Abnormal LFT's / Alcoholic hepatitis  Likely secondary to alcohol abuse  Continue trending do abdominal US with hepatic steatosis  Acute pancreatitis  Lipase level 96 on admission. 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 Significant fatty infiltration of the liver. 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 Nonspecific mild right perinephric stranding/fluid. No hydronephrosis or hydroureter. Bilateral renal symmetrical excretion. 3. No small bowel or colonic obstruction. There is a distended urinary bladder with mild thickening of the wall. Clinical correlation is necessary to exclude mild cystitis. Small amount of air within urinary bladder probable post instrumentation.  CXR 08/28/2014 Lungs hypoexpanded. Mild vascular congestion noted. No focal consolidation seen.  Medical Consultants:   PCCM Other Consultants:   None Anti-Infectives:   None   Faye Ramsay, MD  [facility] Pager [phone]  If 7PM-7AM, please contact night-coverage [email or web address] Password [email or web address] 08/30/2014, 2:25 PM   LOS: 3 days   HPI/Subjective: No events overnight.   Objective: Filed Vitals:   08/30/14 1000 08/30/14 1200 08/30/14 1300 08/30/14 1400  BP: 135/58 128/76 128/76 164/90  Pulse: 99 97 108 103  Temp:  98 F (36.7 C)    TempSrc:  Oral    Resp: 19 23  21   Height:      Weight:      SpO2: 100% 96%  100%    Intake/Output Summary (Last 24 hours) at 08/30/14 1425 Last  data filed at 08/30/14 1200  Gross per 24 hour  Intake 2600.5 ml  Output   2300 ml  Net  300.5 ml    Exam:   General:  Pt is alert, follows commands appropriately, not in acute distress, still somnolent   Cardiovascular: Regular rate and rhythm, no rubs, no gallops  Respiratory: Clear to auscultation bilaterally, no wheezing,   Abdomen: Soft, non tender,  non distended, bowel sounds present, no guarding   Data Reviewed: Basic Metabolic Panel:  Recent Labs Lab 08/28/14 1100 08/28/14 1434 08/28/14 1839 08/29/14 0019 08/30/14 0345  NA 120* 121* 123* 124* 132*  K 3.9 4.4 4.4 4.0 3.4*  CL 82* 85* 85* 88* 95*  CO2 15* 16* 20 20 22   GLUCOSE 112* 87 114* 115* 140*  BUN 12 12 12 13 9   CREATININE 0.77 0.76 0.68 0.79 0.70  CALCIUM 7.4* 7.1* 7.9* 7.6* 8.2*   Liver Function Tests:  Recent Labs Lab 08/27/14 2235 08/28/14 0630 08/30/14 0345  AST 425* 362* 257*  ALT 227* 175* 143*  ALKPHOS 377* 281* 305*  BILITOT 2.1* 1.8* 2.0*  PROT 7.2 5.5* 5.6*  ALBUMIN 3.9 2.9* 2.8*    Recent Labs Lab 08/28/14 0630 08/30/14 0345  LIPASE 96* 115*   CBC:  Recent Labs Lab 08/27/14 2235 08/28/14 0414 08/28/14 0630 08/29/14 0019 08/30/14 0345  WBC 3.3* 4.6 5.3 4.2 3.5*  NEUTROABS  --   --  3.8  --   --   HGB 12.9 11.0* 10.4* 9.8* 9.4*  HCT 36.4 31.4* 29.0* 27.1* 27.1*  MCV 93.3 96.0 94.2 95.1 97.5  PLT 167 130* 128* 215 112*   Cardiac Enzymes:  Recent Labs Lab 08/28/14 0630  CKTOTAL 246*  TROPONINI <0.30   CBG:  Recent Labs Lab 08/29/14 0824 08/29/14 1202 08/29/14 1610 08/30/14 0815 08/30/14 1230  GLUCAP 151* 141* 126* 120* 149*    Recent Results (from the past 240 hour(s))  MRSA PCR SCREENING     Status: None   Collection Time    08/28/14  6:06 AM      Result Value Ref Range Status   MRSA by PCR NEGATIVE  NEGATIVE Final   Comment:            The GeneXpert MRSA Assay (FDA     approved for NASAL specimens     only), is one component of a     comprehensive MRSA colonization     surveillance program. It is not     intended to diagnose MRSA     infection nor to guide or     monitor treatment for     MRSA infections.  CULTURE, BLOOD (ROUTINE X 2)     Status: None   Collection Time    08/28/14  6:30 AM      Result Value Ref Range Status   Specimen Description BLOOD RIGHT ARM   Final   Special Requests  BOTTLES DRAWN AEROBIC ONLY 5CC   Final   Culture  Setup Time     Final   Value: 08/28/2014 11:53     Performed at [facility]   Culture     Final   Value:        BLOOD CULTURE RECEIVED NO GROWTH TO DATE CULTURE WILL BE HELD FOR 5 DAYS BEFORE ISSUING A FINAL NEGATIVE REPORT     Performed at [facility]   Report Status PENDING   Incomplete  CULTURE, BLOOD (ROUTINE X 2)  Status: None   Collection Time    08/28/14  6:40 AM      Result Value Ref Range Status   Specimen Description BLOOD RIGHT ARM   Final   Special Requests BOTTLES DRAWN AEROBIC ONLY 5CC   Final   Culture  Setup Time     Final   Value: 08/28/2014 11:53     Performed at [facility]   Culture     Final   Value:        BLOOD CULTURE RECEIVED NO GROWTH TO DATE CULTURE WILL BE HELD FOR 5 DAYS BEFORE ISSUING A FINAL NEGATIVE REPORT     Performed at [facility]   Report Status PENDING   Incomplete     Scheduled Meds: . antiseptic oral rinse  7 mL Mouth Rinse BID  . escitalopram  20 mg Oral Daily  . folic acid  1 mg Oral Daily  . insulin aspart  0-9 Units Subcutaneous TID WC  . linagliptin  5 mg Oral Daily  . multivitamin with minerals  1 tablet Oral Daily  . sodium chloride  3 mL Intravenous Q12H  . thiamine  100 mg Oral Daily   Or  . thiamine  100 mg Intravenous Daily   Continuous Infusions: . sodium chloride 75 mL/hr at 08/30/14 [phone]

## 2014-08-31 LAB — COMPREHENSIVE METABOLIC PANEL
ALT: 155 U/L — ABNORMAL HIGH (ref 0–35)
AST: 291 U/L — ABNORMAL HIGH (ref 0–37)
Albumin: 2.7 g/dL — ABNORMAL LOW (ref 3.5–5.2)
Alkaline Phosphatase: 297 U/L — ABNORMAL HIGH (ref 39–117)
Anion gap: 12 (ref 5–15)
BUN: 3 mg/dL — ABNORMAL LOW (ref 6–23)
CO2: 26 mEq/L (ref 19–32)
Calcium: 8 mg/dL — ABNORMAL LOW (ref 8.4–10.5)
Chloride: 99 mEq/L (ref 96–112)
Creatinine, Ser: 0.62 mg/dL (ref 0.50–1.10)
GFR calc Af Amer: >90 mL/min (ref >90)
GFR calc non Af Amer: >90 mL/min (ref >90)
Glucose, Bld: 138 mg/dL — ABNORMAL HIGH (ref 70–99)
Potassium: 3.2 mEq/L — ABNORMAL LOW (ref 3.7–5.3)
Sodium: 137 mEq/L (ref 137–147)
Total Bilirubin: 1.6 mg/dL — ABNORMAL HIGH (ref 0.3–1.2)
Total Protein: 5.2 g/dL — ABNORMAL LOW (ref 6.0–8.3)

## 2014-08-31 LAB — GLUCOSE, CAPILLARY
Glucose-Capillary: 149 mg/dL — ABNORMAL HIGH (ref 70–99)
Glucose-Capillary: 153 mg/dL — ABNORMAL HIGH (ref 70–99)
Glucose-Capillary: 151 mg/dL — ABNORMAL HIGH (ref 70–99)
Glucose-Capillary: 176 mg/dL — ABNORMAL HIGH (ref 70–99)
Glucose-Capillary: 151 mg/dL — ABNORMAL HIGH (ref 70–99)

## 2014-08-31 LAB — CBC
HCT: 25.8 % — ABNORMAL LOW (ref 36.0–46.0)
Hemoglobin: 8.9 g/dL — ABNORMAL LOW (ref 12.0–15.0)
MCH: 34.8 pg — ABNORMAL HIGH (ref 26.0–34.0)
MCHC: 34.5 g/dL (ref 30.0–36.0)
MCV: 100.8 fL — ABNORMAL HIGH (ref 78.0–100.0)
Platelets: 106 10*3/uL — ABNORMAL LOW (ref 150–400)
RBC: 2.56 MIL/uL — ABNORMAL LOW (ref 3.87–5.11)
RDW: 15.5 % (ref 11.5–15.5)
WBC: 3.2 10*3/uL — ABNORMAL LOW (ref 4.0–10.5)

## 2014-08-31 LAB — LIPASE, BLOOD: Lipase: 100 U/L — ABNORMAL HIGH (ref 11–59)

## 2014-08-31 MED ORDER — CHLORDIAZEPOXIDE HCL 25 MG PO CAPS
25.0000 mg | ORAL_CAPSULE | Freq: Four times a day (QID) | ORAL | Status: DC | PRN
  Administered 2014-08-31 – 2014-09-05 (×17): 25 mg via ORAL
  Filled 2014-08-31 (×17): qty 1

## 2014-08-31 MED ORDER — POTASSIUM CHLORIDE CRYS ER 20 MEQ PO TBCR
40.0000 meq | EXTENDED_RELEASE_TABLET | Freq: Once | ORAL | Status: AC
  Administered 2014-08-31: 40 meq via ORAL
  Filled 2014-08-31: qty 2

## 2014-08-31 NOTE — Progress Notes (Signed)
Patient ID: [name], female   DOB: 1946/03/29, 68 y.o.   MRN: [id number]  [facility] PROGRESS NOTE  RENIE STELMACH CHY:[id number] DOB: 1946/01/19 DOA: 08/27/2014 PCP: [name], MD  Brief narrative:  68 y.o. female with HTN, DM type II, alcohol abuse, brought to [facility] ED by her husband for alcohol detoxication. He explained that pt has been drinking wine more than usual and has been having abdominal pain. Pt has been fairly poor historian since admission, not much verbal and refusing some medical care recommended.   In the ER patient was mildly hypotensive and sodium was 118. Patient alcohol level was high at 351 and liver function tests elevated at AST 425, ALT 227, ALP 377 and total bilirubin 2.1. UDS positive for benzos. Lipase level was 96. Patient has been admitted for further management of alcohol intoxication with impending delirium tremens and hyponatremia.   Assessment and Plan:   Principal Problem:  Acute alcohol intoxication with delirium tremens / Alcohol abuse  Pt continues to be tremulous on physical exam. She does not require precedex drip.  Continue CIWA monitoring but will not add more ativan as pt is somewhat somnolent this AM  Appreciate PCCM following and their recommendations  Psychiatry consult requested and input is appreciated  Active Problems:  Abnormal LFT's / Alcoholic hepatitis  Likely secondary to alcohol abuse  Trending up slightly  abdominal US with hepatic steatosis  Acute pancreatitis  Lipase level 96 on admission. 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 Significant fatty infiltration of the liver. 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 Nonspecific mild right perinephric stranding/fluid. No hydronephrosis or hydroureter. Bilateral renal symmetrical excretion. 3. No small bowel or colonic obstruction. There is a distended urinary bladder with mild thickening of the wall. Clinical correlation is necessary to exclude mild cystitis. Small amount of air within urinary bladder probable post instrumentation.  CXR 08/28/2014 Lungs hypoexpanded. Mild vascular congestion noted. No focal consolidation seen.  Medical Consultants:   PCCM Other Consultants:   None Anti-Infectives:   None   Faye Ramsay, MD  [facility] Pager [phone]  If 7PM-7AM, please contact night-coverage [email or web address] Password [facility] 08/31/2014, 12:44 PM   LOS: 4 days   HPI/Subjective: No events overnight.   Objective: Filed Vitals:   08/31/14 1000 08/31/14 1100 08/31/14 1140 08/31/14 1200  BP:   162/82   Pulse: 98 95 104 101  Temp:    98.7 F (37.1 C)  TempSrc:  Axillary  Resp: 22 19 19 17   Height:      Weight:      SpO2: 93% 98% 98% 98%    Intake/Output Summary (Last 24 hours) at 08/31/14 1244 Last data filed at 08/31/14 1200  Gross per 24 hour  Intake   2663 ml  Output   5210 ml  Net  -2547 ml    Exam:   General:  Pt is somnolent but easy to arouse, NAD   Cardiovascular: Regular rate and  rhythm, S1/S2, no murmurs, no rubs, no gallops  Respiratory: Clear to auscultation bilaterally, no wheezing, no crackles, no rhonchi  Abdomen: Soft, non tender, non distended, bowel sounds present, no guarding  Extremities: No edema, pulses DP and PT palpable bilaterally   Data Reviewed: Basic Metabolic Panel:  Recent Labs Lab 08/28/14 1434 08/28/14 1839 08/29/14 0019 08/30/14 0345 08/31/14 0518  NA 121* 123* 124* 132* 137  K 4.4 4.4 4.0 3.4* 3.2*  CL 85* 85* 88* 95* 99  CO2 16* 20 20 22 26   GLUCOSE 87 114* 115* 140* 138*  BUN 12 12 13 9  3*  CREATININE 0.76 0.68 0.79 0.70 0.62  CALCIUM 7.1* 7.9* 7.6* 8.2* 8.0*   Liver Function Tests:  Recent Labs Lab 08/27/14 2235 08/28/14 0630 08/30/14 0345 08/31/14 0518  AST 425* 362* 257* 291*  ALT 227* 175* 143* 155*  ALKPHOS 377* 281* 305* 297*  BILITOT 2.1* 1.8* 2.0* 1.6*  PROT 7.2 5.5* 5.6* 5.2*  ALBUMIN 3.9 2.9* 2.8* 2.7*    Recent Labs Lab 08/28/14 0630 08/30/14 0345 08/31/14 0518  LIPASE 96* 115* 100*   CBC:  Recent Labs Lab 08/28/14 0414 08/28/14 0630 08/29/14 0019 08/30/14 0345 08/31/14 0518  WBC 4.6 5.3 4.2 3.5* 3.2*  NEUTROABS  --  3.8  --   --   --   HGB 11.0* 10.4* 9.8* 9.4* 8.9*  HCT 31.4* 29.0* 27.1* 27.1* 25.8*  MCV 96.0 94.2 95.1 97.5 100.8*  PLT 130* 128* 215 112* 106*   Cardiac Enzymes:  Recent Labs Lab 08/28/14 0630  CKTOTAL 246*  TROPONINI <0.30   CBG:  Recent Labs Lab 08/30/14 1230 08/30/14 1620 08/30/14 2148 08/31/14 0725 08/31/14 1114  GLUCAP 149* 202* 149* 153* 151*    Recent Results (from the past 240 hour(s))  MRSA PCR SCREENING     Status: None   Collection Time    08/28/14  6:06 AM      Result Value Ref Range Status   MRSA by PCR NEGATIVE  NEGATIVE Final   Comment:            The GeneXpert MRSA Assay (FDA     approved for NASAL specimens     only), is one component of a     comprehensive MRSA colonization     surveillance program. It is not     intended  to diagnose MRSA     infection nor to guide or     monitor treatment for     MRSA infections.  CULTURE, BLOOD (ROUTINE X 2)     Status: None   Collection Time    08/28/14  6:30 AM      Result Value Ref Range Status   Specimen Description BLOOD RIGHT ARM   Final   Special Requests BOTTLES DRAWN AEROBIC ONLY 5CC   Final   Culture  Setup Time     Final   Value: 08/28/2014 11:53     Performed at [facility]  Final   Value:        BLOOD CULTURE RECEIVED NO GROWTH TO DATE CULTURE WILL BE HELD FOR 5 DAYS BEFORE ISSUING A FINAL NEGATIVE REPORT     Performed at [facility]   Report Status PENDING   Incomplete  CULTURE, BLOOD (ROUTINE X 2)     Status: None   Collection Time    08/28/14  6:40 AM      Result Value Ref Range Status   Specimen Description BLOOD RIGHT ARM   Final   Special Requests BOTTLES DRAWN AEROBIC ONLY 5CC   Final   Culture  Setup Time     Final   Value: 08/28/2014 11:53     Performed at [facility]   Culture     Final   Value:        BLOOD CULTURE RECEIVED NO GROWTH TO DATE CULTURE WILL BE HELD FOR 5 DAYS BEFORE ISSUING A FINAL NEGATIVE REPORT     Performed at [facility]   Report Status PENDING   Incomplete     Scheduled Meds: . antiseptic oral rinse  7 mL Mouth Rinse BID  . escitalopram  20 mg Oral Daily  . folic acid  1 mg Oral Daily  . insulin aspart  0-9 Units Subcutaneous TID WC  . linagliptin  5 mg Oral Daily  . multivitamin with minerals  1 tablet Oral Daily  . sodium chloride  3 mL Intravenous Q12H  . thiamine  100 mg Oral Daily   Or  . thiamine  100 mg Intravenous Daily   Continuous Infusions: . sodium chloride 75 mL/hr at 08/30/14 [phone]

## 2014-09-01 LAB — GLUCOSE, CAPILLARY
Glucose-Capillary: 147 mg/dL — ABNORMAL HIGH (ref 70–99)
Glucose-Capillary: 189 mg/dL — ABNORMAL HIGH (ref 70–99)
Glucose-Capillary: 167 mg/dL — ABNORMAL HIGH (ref 70–99)
Glucose-Capillary: 183 mg/dL — ABNORMAL HIGH (ref 70–99)

## 2014-09-01 LAB — CBC
HCT: 27.1 % — ABNORMAL LOW (ref 36.0–46.0)
Hemoglobin: 9.4 g/dL — ABNORMAL LOW (ref 12.0–15.0)
MCH: 34.6 pg — ABNORMAL HIGH (ref 26.0–34.0)
MCHC: 34.7 g/dL (ref 30.0–36.0)
MCV: 99.6 fL (ref 78.0–100.0)
Platelets: 130 10*3/uL — ABNORMAL LOW (ref 150–400)
RBC: 2.72 MIL/uL — ABNORMAL LOW (ref 3.87–5.11)
RDW: 15.7 % — ABNORMAL HIGH (ref 11.5–15.5)
WBC: 2.8 10*3/uL — ABNORMAL LOW (ref 4.0–10.5)

## 2014-09-01 LAB — BASIC METABOLIC PANEL
Anion gap: 12 (ref 5–15)
BUN: 4 mg/dL — ABNORMAL LOW (ref 6–23)
CO2: 28 mEq/L (ref 19–32)
Calcium: 7.9 mg/dL — ABNORMAL LOW (ref 8.4–10.5)
Chloride: 96 mEq/L (ref 96–112)
Creatinine, Ser: 0.64 mg/dL (ref 0.50–1.10)
GFR calc Af Amer: >90 mL/min (ref >90)
GFR calc non Af Amer: >90 mL/min (ref >90)
Glucose, Bld: 141 mg/dL — ABNORMAL HIGH (ref 70–99)
Potassium: 3.5 mEq/L — ABNORMAL LOW (ref 3.7–5.3)
Sodium: 136 mEq/L — ABNORMAL LOW (ref 137–147)

## 2014-09-01 LAB — VITAMIN B1: Vitamin B1 (Thiamine): 11 nmol/L (ref 8–30)

## 2014-09-01 MED ORDER — PROMETHAZINE HCL 25 MG/ML IJ SOLN
12.5000 mg | Freq: Once | INTRAMUSCULAR | Status: AC
  Administered 2014-09-01: 12.5 mg via INTRAVENOUS
  Filled 2014-09-01: qty 1

## 2014-09-01 MED ORDER — POTASSIUM CHLORIDE CRYS ER 20 MEQ PO TBCR
40.0000 meq | EXTENDED_RELEASE_TABLET | Freq: Once | ORAL | Status: AC
  Administered 2014-09-01: 40 meq via ORAL
  Filled 2014-09-01: qty 2

## 2014-09-01 MED ORDER — HYDRALAZINE HCL 20 MG/ML IJ SOLN: 5.0000 mg | INTRAMUSCULAR | Status: DC | PRN

## 2014-09-01 MED ORDER — HYDRALAZINE HCL 10 MG PO TABS
10.0000 mg | ORAL_TABLET | Freq: Three times a day (TID) | ORAL | Status: DC
  Administered 2014-09-01 – 2014-09-02 (×4): 10 mg via ORAL
  Filled 2014-09-01 (×6): qty 1

## 2014-09-01 NOTE — Progress Notes (Addendum)
Patient ID: [name], female   DOB: [birth year], 68 y.o.   MRN: [id number] [facility] PROGRESS NOTE  LACARA DUNSWORTH YIR:[id number] DOB: November 08, 1946 DOA: 08/27/2014 PCP: [name], MD  Brief narrative:  68 y.o. female with HTN, DM type II, alcohol abuse, brought to [facility] ED by her husband for alcohol detoxication. He explained that pt has been drinking wine more than usual and has been having abdominal pain. Pt has been fairly poor historian since admission, not much verbal and refusing some medical care recommended.   In the ER patient was mildly hypotensive and sodium was 118. Patient alcohol level was high at 351 and liver function tests elevated at AST 425, ALT 227, ALP 377 and total bilirubin 2.1. UDS positive for benzos. Lipase level was 96. Patient has been admitted for further management of alcohol intoxication with impending delirium tremens and hyponatremia.   Assessment and Plan:   Principal Problem:  Acute alcohol intoxication with delirium tremens / Alcohol abuse  Pt continues to be tremulous on physical exam but better overall  Continue CIWA monitoring  Psychiatry consult requested and input is appreciated  Stable for telemetry transfer  Active Problems:  Abnormal LFT's / Alcoholic hepatitis  Likely secondary to alcohol abuse  Repeat CMET in AM abdominal US with hepatic steatosis  Acute pancreatitis  Lipase slowly trending down Supportive care with IV fluids, antiemetics as needed DIABETES MELLITUS, TYPE II  Continue Tradjenta and SSI HYPERTENSION  SBP in 160 this AM  Placed on Hydralazine scheduled and as needed Hypokalemia  Continue to supplement and repeat BMP in AM  Hyponatremia  Na slowly trending up since admission and stable over the past 24 hours  Continue IVF and repeat BMP in AM  Anemia of chronic disease  Hg remains stable over the past 24 hours, 9.8  No signs of bleed  No current indications for transfusion  Thrombocytopenia  From alcohol  induced bone marrow damage  Severe PCM  From underlying alcohol abuse and poor dietary habits  advance diet as pt able to tolerate  DVT prophylaxis  SCD's while pt is in hospital; stopped Lovenox due to thrombocytopenia   Code Status: Full  Family Communication: Pt at bedside  Disposition Plan: Transfer to telemetry   IV Access:   Peripheral IV Procedures and diagnostic studies:   Ct Abdomen Pelvis W Contrast 08/28/2014 There is mild hepatomegaly. Significant fatty infiltration of the liver. 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 Nonspecific mild right perinephric stranding/fluid. No hydronephrosis or hydroureter. Bilateral renal symmetrical excretion. 3. No small bowel or colonic obstruction. There is a distended urinary bladder with mild thickening of the wall. Clinical correlation is necessary to exclude mild cystitis. Small amount of air within urinary bladder probable post instrumentation.  CXR 08/28/2014 Lungs hypoexpanded. Mild vascular congestion noted. No focal consolidation seen.  Medical Consultants:   PCCM Psych Other Consultants:   None Anti-Infectives:   None  Faye Ramsay, MD  [facility] Pager [phone]  If 7PM-7AM, please contact night-coverage [email or web address] Password [email or web address] 09/01/2014, 11:00 AM   LOS: 5 days   HPI/Subjective: No events overnight.   Objective: Filed Vitals:   08/31/14 2356 09/01/14 0000 09/01/14 0400 09/01/14 0800  BP: 165/86  115/93 165/75  Pulse:   87 90  Temp:  98.9 F (37.2 C) 99 F (37.2 C) 98.7 F (37.1 C)  TempSrc:  Oral Axillary Oral  Resp:   10 19  Height:      Weight:   76.1 kg (167  lb 12.3 oz)   SpO2:   97% 91%    Intake/Output Summary (Last 24 hours) at 09/01/14 1100 Last data filed at 09/01/14 1037  Gross per 24 hour  Intake   2448 ml  Output   3450 ml  Net  -1002 ml    Exam:   General:  Pt is alert, follows commands appropriately, not in acute distress  Cardiovascular: Regular rate and rhythm, S1/S2, no  murmurs, no rubs, no gallops  Respiratory: Clear to auscultation bilaterally, no wheezing, no crackles, no rhonchi  Abdomen: Soft, non tender, non distended, bowel sounds present, no guarding  Extremities: No edema, pulses DP and PT palpable bilaterally   Data Reviewed: Basic Metabolic Panel:  Recent Labs Lab 08/28/14 1839 08/29/14 0019 08/30/14 0345 08/31/14 0518 09/01/14 0338  NA 123* 124* 132* 137 136*  K 4.4 4.0 3.4* 3.2* 3.5*  CL 85* 88* 95* 99 96  CO2 20 20 22 26 28   GLUCOSE 114* 115* 140* 138* 141*  BUN 12 13 9  3* 4*  CREATININE 0.68 0.79 0.70 0.62 0.64  CALCIUM 7.9* 7.6* 8.2* 8.0* 7.9*   Liver Function Tests:  Recent Labs Lab 08/27/14 2235 08/28/14 0630 08/30/14 0345 08/31/14 0518  AST 425* 362* 257* 291*  ALT 227* 175* 143* 155*  ALKPHOS 377* 281* 305* 297*  BILITOT 2.1* 1.8* 2.0* 1.6*  PROT 7.2 5.5* 5.6* 5.2*  ALBUMIN 3.9 2.9* 2.8* 2.7*    Recent Labs Lab 08/28/14 0630 08/30/14 0345 08/31/14 0518  LIPASE 96* 115* 100*    CBC:  Recent Labs Lab 08/28/14 0630 08/29/14 0019 08/30/14 0345 08/31/14 0518 09/01/14 0338  WBC 5.3 4.2 3.5* 3.2* 2.8*  NEUTROABS 3.8  --   --   --   --   HGB 10.4* 9.8* 9.4* 8.9* 9.4*  HCT 29.0* 27.1* 27.1* 25.8* 27.1*  MCV 94.2 95.1 97.5 100.8* 99.6  PLT 128* 215 112* 106* 130*   Cardiac Enzymes:  Recent Labs Lab 08/28/14 0630  CKTOTAL 246*  TROPONINI <0.30   BNP: No components found with this basename: POCBNP,  CBG:  Recent Labs Lab 08/30/14 2148 08/31/14 0725 08/31/14 1114 08/31/14 1642 08/31/14 2216  GLUCAP 149* 153* 151* 176* 151*    Recent Results (from the past 240 hour(s))  MRSA PCR SCREENING     Status: None   Collection Time    08/28/14  6:06 AM      Result Value Ref Range Status   MRSA by PCR NEGATIVE  NEGATIVE Final   Comment:            The GeneXpert MRSA Assay (FDA     approved for NASAL specimens     only), is one component of a     comprehensive MRSA colonization      surveillance program. It is not     intended to diagnose MRSA     infection nor to guide or     monitor treatment for     MRSA infections.  CULTURE, BLOOD (ROUTINE X 2)     Status: None   Collection Time    08/28/14  6:30 AM      Result Value Ref Range Status   Specimen Description BLOOD RIGHT ARM   Final   Special Requests BOTTLES DRAWN AEROBIC ONLY 5CC   Final   Culture  Setup Time     Final   Value: 08/28/2014 11:53     Performed at [facility]  Final   Value:        BLOOD CULTURE RECEIVED NO GROWTH TO DATE CULTURE WILL BE HELD FOR 5 DAYS BEFORE ISSUING A FINAL NEGATIVE REPORT     Performed at [facility]   Report Status PENDING   Incomplete  CULTURE, BLOOD (ROUTINE X 2)     Status: None   Collection Time    08/28/14  6:40 AM      Result Value Ref Range Status   Specimen Description BLOOD RIGHT ARM   Final   Special Requests BOTTLES DRAWN AEROBIC ONLY 5CC   Final   Culture  Setup Time     Final   Value: 08/28/2014 11:53     Performed at [facility]   Culture     Final   Value:        BLOOD CULTURE RECEIVED NO GROWTH TO DATE CULTURE WILL BE HELD FOR 5 DAYS BEFORE ISSUING A FINAL NEGATIVE REPORT     Performed at [facility]   Report Status PENDING   Incomplete     Scheduled Meds: . antiseptic oral rinse  7 mL Mouth Rinse BID  . escitalopram  20 mg Oral Daily  . folic acid  1 mg Oral Daily  . insulin aspart  0-9 Units Subcutaneous TID WC  . linagliptin  5 mg Oral Daily  . multivitamin with minerals  1 tablet Oral Daily  . sodium chloride  3 mL Intravenous Q12H  . thiamine  100 mg Oral Daily   Or  . thiamine  100 mg Intravenous Daily   Continuous Infusions: . sodium chloride 75 mL/hr at 08/31/14 1546

## 2014-09-02 LAB — COMPREHENSIVE METABOLIC PANEL
ALT: 156 U/L — ABNORMAL HIGH (ref 0–35)
AST: 249 U/L — ABNORMAL HIGH (ref 0–37)
Albumin: 2.7 g/dL — ABNORMAL LOW (ref 3.5–5.2)
Alkaline Phosphatase: 298 U/L — ABNORMAL HIGH (ref 39–117)
Anion gap: 10 (ref 5–15)
BUN: 5 mg/dL — ABNORMAL LOW (ref 6–23)
CO2: 27 mEq/L (ref 19–32)
Calcium: 8.2 mg/dL — ABNORMAL LOW (ref 8.4–10.5)
Chloride: 96 mEq/L (ref 96–112)
Creatinine, Ser: 0.67 mg/dL (ref 0.50–1.10)
GFR calc Af Amer: >90 mL/min (ref >90)
GFR calc non Af Amer: 90 mL/min — ABNORMAL LOW (ref >90)
Glucose, Bld: 196 mg/dL — ABNORMAL HIGH (ref 70–99)
Potassium: 4.2 mEq/L (ref 3.7–5.3)
Sodium: 133 mEq/L — ABNORMAL LOW (ref 137–147)
Total Bilirubin: 1.2 mg/dL (ref 0.3–1.2)
Total Protein: 5.4 g/dL — ABNORMAL LOW (ref 6.0–8.3)

## 2014-09-02 LAB — LIPASE, BLOOD: Lipase: 98 U/L — ABNORMAL HIGH (ref 11–59)

## 2014-09-02 LAB — GLUCOSE, CAPILLARY
Glucose-Capillary: 174 mg/dL — ABNORMAL HIGH (ref 70–99)
Glucose-Capillary: 232 mg/dL — ABNORMAL HIGH (ref 70–99)
Glucose-Capillary: 161 mg/dL — ABNORMAL HIGH (ref 70–99)
Glucose-Capillary: 193 mg/dL — ABNORMAL HIGH (ref 70–99)

## 2014-09-02 LAB — CBC
HCT: 29.4 % — ABNORMAL LOW (ref 36.0–46.0)
Hemoglobin: 9.6 g/dL — ABNORMAL LOW (ref 12.0–15.0)
MCH: 33.7 pg (ref 26.0–34.0)
MCHC: 32.7 g/dL (ref 30.0–36.0)
MCV: 103.2 fL — ABNORMAL HIGH (ref 78.0–100.0)
Platelets: 129 10*3/uL — ABNORMAL LOW (ref 150–400)
RBC: 2.85 MIL/uL — ABNORMAL LOW (ref 3.87–5.11)
RDW: 16.4 % — ABNORMAL HIGH (ref 11.5–15.5)
WBC: 2.9 10*3/uL — ABNORMAL LOW (ref 4.0–10.5)

## 2014-09-02 MED ORDER — HYDROMORPHONE HCL 1 MG/ML IJ SOLN
1.0000 mg | INTRAMUSCULAR | Status: DC | PRN
Start: 2014-09-02 — End: 2014-09-03
  Administered 2014-09-02: 1 mg via INTRAVENOUS
  Filled 2014-09-02: qty 1

## 2014-09-02 MED ORDER — INSULIN ASPART 100 UNIT/ML ~~LOC~~ SOLN: 0.0000 [IU] | Freq: Every day | SUBCUTANEOUS | Status: DC

## 2014-09-02 MED ORDER — INSULIN ASPART 100 UNIT/ML ~~LOC~~ SOLN
3.0000 [IU] | Freq: Three times a day (TID) | SUBCUTANEOUS | Status: DC
  Administered 2014-09-02 – 2014-09-05 (×9): 3 [IU] via SUBCUTANEOUS

## 2014-09-02 MED ORDER — OXYCODONE HCL 5 MG PO TABS
10.0000 mg | ORAL_TABLET | ORAL | Status: DC | PRN
  Administered 2014-09-02 – 2014-09-05 (×12): 10 mg via ORAL
  Filled 2014-09-02 (×12): qty 2

## 2014-09-02 MED ORDER — INSULIN ASPART 100 UNIT/ML ~~LOC~~ SOLN
0.0000 [IU] | Freq: Three times a day (TID) | SUBCUTANEOUS | Status: DC
  Administered 2014-09-02: 3 [IU] via SUBCUTANEOUS
  Administered 2014-09-03 (×2): 2 [IU] via SUBCUTANEOUS
  Administered 2014-09-03 – 2014-09-04 (×3): 3 [IU] via SUBCUTANEOUS
  Administered 2014-09-04: 5 [IU] via SUBCUTANEOUS
  Administered 2014-09-05: 3 [IU] via SUBCUTANEOUS
  Administered 2014-09-05: 5 [IU] via SUBCUTANEOUS

## 2014-09-02 MED ORDER — PROMETHAZINE HCL 25 MG/ML IJ SOLN
12.5000 mg | Freq: Four times a day (QID) | INTRAMUSCULAR | Status: DC | PRN
  Administered 2014-09-03 – 2014-09-04 (×2): 12.5 mg via INTRAVENOUS
  Filled 2014-09-02 (×2): qty 1

## 2014-09-02 MED ORDER — HYDRALAZINE HCL 25 MG PO TABS
25.0000 mg | ORAL_TABLET | Freq: Three times a day (TID) | ORAL | Status: DC
  Administered 2014-09-02 – 2014-09-03 (×3): 25 mg via ORAL
  Filled 2014-09-02 (×5): qty 1

## 2014-09-02 NOTE — Progress Notes (Signed)
Clinical Social Work Department CLINICAL SOCIAL WORK PSYCHIATRY SERVICE LINE ASSESSMENT 09/02/2014  Patient:  [name]  Account:  [id number]  [place] Date:  08/27/2014  Clinical Social Worker:  Sindy Messing, LCSW  Date/Time:  09/02/2014 04:00 PM Referred by:  Physician  Date referred:  09/02/2014 Reason for Referral  Substance Abuse   Presenting Symptoms/Problems (In the person's/family's own words):   Psych consulted due to substance use.   Abuse/Neglect/Trauma History (check all that apply)  Denies history   Abuse/Neglect/Trauma Comments:   Psychiatric History (check all that apply)  Outpatient treatment  Inpatient/hospitilization  Residential treatment   Psychiatric medications:  Lexapro 20 mg   Current Mental Health Hospitalizations/Previous Mental Health History:   Patient reports a history of depression and anxiety. Patient currently receiving medication management.   Current provider:   Dr. Shelly Bombard and Date:   [place], [place]   Current Medications:   Scheduled Meds:      . antiseptic oral rinse  7 mL Mouth Rinse BID  . escitalopram  20 mg Oral Daily  . folic acid  1 mg Oral Daily  . hydrALAZINE  25 mg Oral 3 times per day  . insulin aspart  0-15 Units Subcutaneous TID WC  . insulin aspart  0-5 Units Subcutaneous QHS  . insulin aspart  3 Units Subcutaneous TID WC  . linagliptin  5 mg Oral Daily  . multivitamin with minerals  1 tablet Oral Daily  . sodium chloride  3 mL Intravenous Q12H  . thiamine  100 mg Oral Daily   Or     . thiamine  100 mg Intravenous Daily        Continuous Infusions:      . sodium chloride 75 mL/hr at 08/31/14 1546          PRN Meds:.albuterol, chlordiazePOXIDE, hydrALAZINE, HYDROmorphone (DILAUDID) injection, ondansetron (ZOFRAN) IV, ondansetron, oxyCODONE, promethazine       Previous Impatient Admission/Date/Reason:   Patient reports she has been to  several residential [place] treatment facilities including: [name],  the [facility], and [facility].   Emotional Health / Current Symptoms    Suicide/Self Harm  Suicide attempt in past (date/description)   Suicide attempt in the past:   Patient reports one previous attempt. Patient denies any current SI or HI.   Other harmful behavior:   None reported   Psychotic/Dissociative Symptoms  None reported   Other Psychotic/Dissociative Symptoms:    Attention/Behavioral Symptoms  Within Normal Limits   Other Attention / Behavioral Symptoms:   Patient engaged during assessment.    Cognitive Impairment  Within Normal Limits   Other Cognitive Impairment:   Patient alert and oriented.    Mood and Adjustment  Flat    Stress, Anxiety, Trauma, Any Recent Loss/Stressor  Relationship   Anxiety (frequency):   N/A   Phobia (specify):   N/A   Compulsive behavior (specify):   N/A   Obsessive behavior (specify):   N/A   Other:   Patient was married for over 73 years but is currently going through a divorce.   Substance Abuse/Use  Current substance use   SBIRT completed (please refer for detailed history):  Y  Self-reported substance use:   Patient reports she was drinking several cups of wine for about 3 weeks prior to admission. Patient started drinking alcohol when she was in her 10s. Patient has had periods of sobriety but started drinking again due to divorce. Patient has gone through inpatient and outpatient  SA treatment in the past.   Urinary Drug Screen Completed:  Y Alcohol level:   351    Environmental/Housing/Living Arrangement  Stable housing   Who is in the home:   Alone   Emergency contact:  None given at this time.   Financial  Private Insurance  Medicare   Patient's Strengths and Goals (patient's own words):   Patient reports she has a good support system from her family in [place]. Patient wants to stay sober and is considering [place] meetings again.   Clinical Social Worker's Interpretive Summary:   CSW  received referral in order to complete psychosocial assessment. Per chart review, psych MD evaluated patient on 9/18 and reports patient does not meet criteria for inpatient psych placement. Psych MD did recommend residential SA treatment or IOP. CSW spoke with bedside RN and met with patient at bedside. CSW introduced myself and explained role.    Patient reports she was living at home alone prior to admission. Patient states she was admitted due to pancreatitis because she had been drinking alcohol for the past three weeks. Patient has been married for over 80 years but she and husband are currently going through a divorce. Patient reports that she had two children but one child passed away. Patient's dtr lives in [place]. Patient reports all of her friends and family live in [place] and she thinks it will be better if she can move there in order to have additional support.    Patient reports that she was diagnosed with depression and anxiety several years ago and has followed up with Dr. [facility] for about 15 years. Patient does acknowledge that divorce and depression contribute to her substance use. Patient reports that she has had long periods of sobriety and even helped run a support group in [place]. Patient has been to several residential facilities and does not feel it would be helpful to return. Patient reports she knows all the steps but feels that she needs the personal support in order to stay sober. Patient reports material for staying sober has remained the same but she needs the emotional support at home to be successful.    Patient denies any SI or HI or any psychotic features. Patient engaged in assessment and open to discussing substance use. Patient does not feel she needs further treatment and wants to work with family so that they can support her. Patient agreeable for CSW to continue to follow throughout hospital stay.   Disposition:  Recommend Psych CSW continuing to support while in hospital  [address], [place] [phone]

## 2014-09-02 NOTE — Progress Notes (Signed)
Pt unable to void after catheter being removed. Bladder scan yielded 339 cc. Pt currently refusing a foley or I&O catheter. Horald Chestnut, NP notified. Will continue to monitor.

## 2014-09-02 NOTE — Progress Notes (Signed)
Patient ID: [name], female   DOB: 1946/06/22, 68 y.o.   MRN: [id number]  [facility] PROGRESS NOTE  [name] YQM:[id number] DOB: [birth year] DOA: 08/27/2014 PCP: [name], MD  Brief narrative: 68 y.o. female with HTN, DM type II, alcohol abuse, brought to [facility] ED by her husband for alcohol detoxication. He explained that pt has been drinking wine more than usual and has been having abdominal pain. Pt has been fairly poor historian since admission, not much verbal and refusing some medical care recommended.   In the ER patient was mildly hypotensive and sodium was 118. Patient alcohol level was high at 351 and liver function tests elevated at AST 425, ALT 227, ALP 377 and total bilirubin 2.1. UDS positive for benzos. Lipase level was 96. Patient has been admitted for further management of alcohol intoxication with impending delirium tremens and hyponatremia.   Assessment and Plan:   Principal Problem:  Acute alcohol intoxication with delirium tremens / Alcohol abuse  Pt continues to be slightly tremulous on physical exam but better overall  Continue CIWA monitoring  Psychiatry consult requested and input is appreciated  Active Problems:  Abnormal LFT's / Alcoholic hepatitis  Likely secondary to alcohol induced hepatitis  Slightly better but still elevated  abdominal US with hepatic steatosis  Acute pancreatitis  Lipase slowly trending down  Supportive care with IV fluids, antiemetics as needed Will change diet to full liquid and will repeat lipase in AM DIABETES MELLITUS, TYPE II  Continue Tradjenta and SSI Increase coverage to moderate with night coverage as well  HYPERTENSION  SBP in 140's this AM  Placed on Hydralazine scheduled and as needed Hypokalemia  Supplemented and WNL this AM Hyponatremia  Na remains stable between 133 - 136  Continue IVF and repeat BMP in AM  Anemia of chronic disease  Hg remains stable over the past 24 hours, 9.8  No signs of  bleed  No current indications for transfusion  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 Significant fatty infiltration of the liver. 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 Nonspecific mild right perinephric stranding/fluid. No hydronephrosis or hydroureter. Bilateral renal symmetrical excretion. 3. No small bowel or colonic obstruction. There is a distended urinary bladder with mild thickening of the wall. Clinical correlation is necessary to exclude mild cystitis. Small amount of air within urinary bladder probable post instrumentation.  CXR 08/28/2014 Lungs hypoexpanded. Mild vascular congestion noted. No focal consolidation seen.  Medical Consultants:   PCCM  Psych Other Consultants:   None Anti-Infectives:   None  Faye Ramsay, MD  [facility] Pager [phone]  If 7PM-7AM, please contact night-coverage [email or web address] Password [facility] 09/02/2014, 3:36 PM   LOS: 6 days   HPI/Subjective: No events overnight. Still reports abd pain and nausea.   Objective: Filed Vitals:   09/01/14 2155 09/02/14 0629 09/02/14 1200 09/02/14 1324  BP: 148/77 161/91 142/80 143/77  Pulse: 99 93 102 96  Temp: 99.3 F (37.4 C) 98.6 F (37 C)  98.1 F (36.7 C)  TempSrc: Oral Oral  Oral  Resp: 20 18  18   Height:      Weight:  75.4 kg (166 lb 3.6 oz)    SpO2: 94% 96%  98%    Intake/Output Summary (Last 24 hours) at 09/02/14 1536 Last data filed at 09/02/14 1441  Gross per 24 hour  Intake   2055 ml  Output   2702 ml  Net   -647 ml     Exam:   General:  Pt is alert, follows commands appropriately, not in acute distress  Cardiovascular: Regular rate and rhythm, S1/S2, no murmurs, no rubs, no gallops  Respiratory: Clear to auscultation bilaterally, no wheezing, no crackles, no rhonchi  Abdomen: Soft, tender in epigastric area, non distended, bowel sounds present, no guarding  Extremities: No edema, pulses DP and PT palpable bilaterally  Neuro: Grossly nonfocal  Data Reviewed: Basic Metabolic Panel:  Recent Labs Lab 08/29/14 0019 08/30/14 0345 08/31/14 0518 09/01/14 0338 09/02/14 0400  NA 124* 132* 137 136* 133*  K 4.0 3.4* 3.2* 3.5* 4.2  CL 88* 95* 99 96 96  CO2 20 22 26 28 27   GLUCOSE 115* 140* 138* 141* 196*  BUN 13 9 3* 4* 5*  CREATININE 0.79 0.70 0.62 0.64 0.67  CALCIUM 7.6* 8.2* 8.0* 7.9* 8.2*   Liver Function Tests:  Recent Labs Lab 08/27/14 2235 08/28/14 0630 08/30/14 0345 08/31/14 0518 09/02/14 0400  AST 425* 362* 257* 291* 249*  ALT 227* 175* 143* 155* 156*  ALKPHOS 377* 281* 305* 297* 298*  BILITOT 2.1* 1.8* 2.0* 1.6* 1.2  PROT 7.2 5.5* 5.6* 5.2* 5.4*  ALBUMIN 3.9 2.9* 2.8* 2.7* 2.7*    Recent Labs Lab 08/28/14 0630 08/30/14 0345 08/31/14 0518 09/02/14 0400  LIPASE 96* 115* 100* 98*   CBC:  Recent Labs Lab 08/28/14 0630 08/29/14 0019 08/30/14 0345 08/31/14 0518 09/01/14 0338 09/02/14 0400  WBC 5.3 4.2 3.5* 3.2* 2.8* 2.9*  NEUTROABS 3.8  --   --   --   --   --   HGB 10.4* 9.8* 9.4* 8.9* 9.4* 9.6*  HCT 29.0* 27.1* 27.1* 25.8* 27.1* 29.4*  MCV 94.2 95.1 97.5 100.8* 99.6 103.2*  PLT 128* 215 112* 106* 130* 129*   Cardiac Enzymes:  Recent Labs Lab 08/28/14 0630  CKTOTAL 246*  TROPONINI <0.30   CBG:  Recent Labs Lab 09/01/14 1209 09/01/14 1643 09/01/14 2152 09/02/14 0807 09/02/14 1136  GLUCAP 189* 167* 183* 174* 232*    Recent Results (from the past 240 hour(s))  MRSA PCR SCREENING     Status: None   Collection Time    08/28/14  6:06 AM       Result Value Ref Range Status   MRSA by PCR NEGATIVE  NEGATIVE Final   Comment:            The GeneXpert MRSA Assay (FDA     approved for NASAL specimens     only), is one component of a     comprehensive MRSA colonization     surveillance program. It is not     intended to diagnose MRSA     infection nor to guide or     monitor treatment for     MRSA infections.  CULTURE, BLOOD (ROUTINE X 2)     Status: None   Collection Time    08/28/14  6:30 AM      Result Value Ref Range Status   Specimen Description BLOOD RIGHT ARM   Final   Special  Requests BOTTLES DRAWN AEROBIC ONLY 5CC   Final   Culture  Setup Time     Final   Value: 08/28/2014 11:53     Performed at [facility]   Culture     Final   Value:        BLOOD CULTURE RECEIVED NO GROWTH TO DATE CULTURE WILL BE HELD FOR 5 DAYS BEFORE ISSUING A FINAL NEGATIVE REPORT     Performed at [facility]   Report Status PENDING   Incomplete  CULTURE, BLOOD (ROUTINE X 2)     Status: None   Collection Time    08/28/14  6:40 AM      Result Value Ref Range Status   Specimen Description BLOOD RIGHT ARM   Final   Special Requests BOTTLES DRAWN AEROBIC ONLY 5CC   Final   Culture  Setup Time     Final   Value: 08/28/2014 11:53     Performed at [facility]   Culture     Final   Value:        BLOOD CULTURE RECEIVED NO GROWTH TO DATE CULTURE WILL BE HELD FOR 5 DAYS BEFORE ISSUING A FINAL NEGATIVE REPORT     Performed at [facility]   Report Status PENDING   Incomplete     Scheduled Meds: . antiseptic oral rinse  7 mL Mouth Rinse BID  . escitalopram  20 mg Oral Daily  . folic acid  1 mg Oral Daily  . hydrALAZINE  10 mg Oral 3 times per day  . insulin aspart  0-9 Units Subcutaneous TID WC  . linagliptin  5 mg Oral Daily  . multivitamin with minerals  1 tablet Oral Daily  . sodium chloride  3 mL Intravenous Q12H  . thiamine  100 mg Oral Daily   Or  . thiamine  100 mg Intravenous Daily   Continuous  Infusions: . sodium chloride 75 mL/hr at 08/31/14 1546

## 2014-09-02 NOTE — Care Management Note (Addendum)
    Page 1 of 2   09/05/2014     2:05:35 PM CARE MANAGEMENT NOTE 09/05/2014  Patient:  JANIQUA, FRISCIA A   Account Number:  [id number]  Date Initiated:  09/02/2014  Documentation initiated by:  [facility]  Subjective/Objective Assessment:   68 Y/O F ADMITTED W/ETOH ABUSE.     Action/Plan:   FROM HOME.   Anticipated DC Date:  09/05/2014   Anticipated DC Plan:  [place]  CM consult      Choice offered to / List presented to:  C-1 Patient   DME arranged  [place]      DME agency  [place] arranged  HH-1 RN  [place].   Status of service:  Completed, signed off Medicare Important Message given?   (If response is "NO", the following Medicare IM given date fields will be blank) Date Medicare IM given:   Medicare IM given by:   Date Additional Medicare IM given:   Additional Medicare IM given by:    Discharge Disposition:  [place]  Per UR Regulation:  Reviewed for med. necessity/level of care/duration of stay  If discussed at [place] of Stay Meetings, dates discussed:   09/05/2014    Comments:  09/05/14 Shalawn Wynder RN,BSN NCM [phone] [place].[facility].TC [facility]ST[facility] REP LEFT VM REFERRAL,[facility]HC ORDERS, & D/C TODAY.[facility] DME REP [place] HAS ALREADY BROUGHT RW,3N1 TO RM.PROVIDED PATIENT W/PRIVATE DUTY LIST AS RESOURCE FOR ADDITIONAL SUPPORT,(INDEPENDANT DECISION,& PUT OF POCKET COST)PATIENT VOICED UNDERSTANDING.NO FURTHER D/C NEEDS.  09/02/14 Mellody Masri RN,BSN NCM [phone] DT'S.PSYCH CONS.AWAIT RECOMMENDATIONS.

## 2014-09-02 NOTE — Progress Notes (Signed)
Foley catheter removed per hospital protocol.  Patient due to void, instructed patient to call nurse when she is ready to void.  Will continue to monitor.

## 2014-09-02 NOTE — Progress Notes (Signed)
Inpatient Diabetes Program Recommendations  AACE/ADA: New Consensus Statement on Inpatient Glycemic Control (2013)  Target Ranges:  Prepandial:   less than 140 mg/dL      Peak postprandial:   less than 180 mg/dL (1-2 hours)      Critically ill patients:  140 - 180 mg/dL    Inpatient Diabetes Program Recommendations Correction (SSI): Please consider increase in correction scale to moderate or resistant tidwc and HS scale  Thank you, Rosita Kea, RN, CNS, Diabetes Coordinator [phone])

## 2014-09-03 LAB — COMPREHENSIVE METABOLIC PANEL
ALT: 150 U/L — ABNORMAL HIGH (ref 0–35)
AST: 209 U/L — ABNORMAL HIGH (ref 0–37)
Albumin: 2.9 g/dL — ABNORMAL LOW (ref 3.5–5.2)
Alkaline Phosphatase: 309 U/L — ABNORMAL HIGH (ref 39–117)
Anion gap: 11 (ref 5–15)
BUN: 5 mg/dL — ABNORMAL LOW (ref 6–23)
CO2: 25 mEq/L (ref 19–32)
Calcium: 8.5 mg/dL (ref 8.4–10.5)
Chloride: 98 mEq/L (ref 96–112)
Creatinine, Ser: 0.62 mg/dL (ref 0.50–1.10)
GFR calc Af Amer: >90 mL/min (ref >90)
GFR calc non Af Amer: >90 mL/min (ref >90)
Glucose, Bld: 149 mg/dL — ABNORMAL HIGH (ref 70–99)
Potassium: 3.8 mEq/L (ref 3.7–5.3)
Sodium: 134 mEq/L — ABNORMAL LOW (ref 137–147)
Total Bilirubin: 1 mg/dL (ref 0.3–1.2)
Total Protein: 5.8 g/dL — ABNORMAL LOW (ref 6.0–8.3)

## 2014-09-03 LAB — URINALYSIS, ROUTINE W REFLEX MICROSCOPIC
Bilirubin Urine: NEGATIVE
Glucose, UA: NEGATIVE mg/dL
Hgb urine dipstick: NEGATIVE
Ketones, ur: NEGATIVE mg/dL
Nitrite: NEGATIVE
Protein, ur: NEGATIVE mg/dL
Specific Gravity, Urine: 1.005 (ref 1.005–1.030)
Urobilinogen, UA: 0.2 mg/dL (ref 0.0–1.0)
pH: 6 (ref 5.0–8.0)

## 2014-09-03 LAB — CULTURE, BLOOD (ROUTINE X 2)
Culture: NO GROWTH
Culture: NO GROWTH

## 2014-09-03 LAB — URINE MICROSCOPIC-ADD ON

## 2014-09-03 LAB — CBC
HCT: 30 % — ABNORMAL LOW (ref 36.0–46.0)
Hemoglobin: 10.2 g/dL — ABNORMAL LOW (ref 12.0–15.0)
MCH: 34.7 pg — ABNORMAL HIGH (ref 26.0–34.0)
MCHC: 34 g/dL (ref 30.0–36.0)
MCV: 102 fL — ABNORMAL HIGH (ref 78.0–100.0)
Platelets: 155 10*3/uL (ref 150–400)
RBC: 2.94 MIL/uL — ABNORMAL LOW (ref 3.87–5.11)
RDW: 17.3 % — ABNORMAL HIGH (ref 11.5–15.5)
WBC: 3.2 10*3/uL — ABNORMAL LOW (ref 4.0–10.5)

## 2014-09-03 LAB — GLUCOSE, CAPILLARY
Glucose-Capillary: 178 mg/dL — ABNORMAL HIGH (ref 70–99)
Glucose-Capillary: 124 mg/dL — ABNORMAL HIGH (ref 70–99)
Glucose-Capillary: 140 mg/dL — ABNORMAL HIGH (ref 70–99)
Glucose-Capillary: 159 mg/dL — ABNORMAL HIGH (ref 70–99)

## 2014-09-03 LAB — LIPASE, BLOOD: Lipase: 53 U/L (ref 11–59)

## 2014-09-03 MED ORDER — HYDRALAZINE HCL 10 MG PO TABS
10.0000 mg | ORAL_TABLET | Freq: Three times a day (TID) | ORAL | Status: DC
  Administered 2014-09-03 – 2014-09-05 (×6): 10 mg via ORAL
  Filled 2014-09-03 (×7): qty 1

## 2014-09-03 MED ORDER — HYDROMORPHONE HCL 1 MG/ML IJ SOLN
1.0000 mg | INTRAMUSCULAR | Status: DC | PRN
  Administered 2014-09-04: 1 mg via INTRAVENOUS
  Filled 2014-09-03: qty 1

## 2014-09-03 NOTE — Progress Notes (Signed)
Patient ID: [name], female   DOB: [birth year], 68 y.o.   MRN: [id number]  [facility] PROGRESS NOTE  [name] [id number]:[id number] DOB: 10/21/1946 DOA: 08/27/2014 PCP: [name], MD  Brief narrative:  68 y.o. female with HTN, DM type II, alcohol abuse, brought to [facility] ED by her husband for alcohol detoxication. He explained that pt has been drinking wine more than usual and has been having abdominal pain. Pt has been fairly poor historian since admission, not much verbal and refusing some medical care recommended.   In the ER patient was mildly hypotensive and sodium was 118. Patient alcohol level was high at 351 and liver function tests elevated at AST 425, ALT 227, ALP 377 and total bilirubin 2.1. UDS positive for benzos. Lipase level was 96. Patient has been admitted for further management of alcohol intoxication with impending delirium tremens and hyponatremia.   Assessment and Plan:   Principal Problem:  Acute alcohol intoxication with delirium tremens / Alcohol abuse  Pt continues to be slightly tremulous on physical exam but better overall  Continue CIWA monitoring  Psychiatry consult requested and input is appreciated  No evidence of imminent risk to self or others and therefor psych did not recommend inpatient psych  Active Problems:  Abnormal LFT's / Alcoholic hepatitis  Likely secondary to alcohol induced hepatitis  Slightly better compared to the admission values but still elevated  abdominal US with hepatic steatosis  Acute pancreatitis  Alcohol induced  Lipase is trending down and it is WNL this AM Supportive care with IV fluids, antiemetics as needed  Advance diet to regular this AM  DIABETES MELLITUS, TYPE II  Continue Tradjenta and SSI  Increase coverage to moderate with night coverage as well  CBG's remain reasonably stable in 120 - 160's  HYPERTENSION  SBP in 100's this AM Placed on Hydralazine scheduled and as needed, will decrease the dose of  Hydralazine from 25 mg TID to 10 mg TID PO Hypokalemia  Supplemented and WNL this AM Hyponatremia  Na remains stable between 133 - 136  Continue IVF and repeat BMP in AM  Anemia of chronic disease  Hg remains stable over the past 24 hours, 10.2 this AM No signs of bleed  No current indications for transfusion  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 Significant fatty infiltration of the liver. 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 Nonspecific mild right perinephric stranding/fluid. No hydronephrosis or hydroureter. Bilateral renal symmetrical excretion. 3. No small bowel or colonic obstruction. There is a distended urinary bladder with mild thickening of the wall. Clinical correlation is necessary to exclude mild cystitis. Small amount of air within urinary bladder probable post instrumentation.  CXR 08/28/2014 Lungs hypoexpanded. Mild vascular congestion noted. No focal consolidation seen.  Medical Consultants:   PCCM  Psych Other Consultants:   None Anti-Infectives:   None  Faye Ramsay, MD  [facility] Pager [phone]  If 7PM-7AM, please contact night-coverage [email or web address] Password [facility] 09/03/2014, 4:58 PM   LOS: 7  days   HPI/Subjective: No events overnight.   Objective: Filed Vitals:   09/02/14 1814 09/02/14 2154 09/03/14 0444 09/03/14 1408  BP: 137/73 139/75 148/92 105/48  Pulse: 108 90 96 96  Temp:  97.6 F (36.4 C) 98.1 F (36.7 C) 98.6 F (37 C)  TempSrc:  Oral Oral Oral  Resp:  20 18 20 20   Height:      Weight:   77.2 kg (170 lb 3.1 oz)   SpO2:  95% 96% 96%    Intake/Output Summary (Last 24 hours) at 09/03/14 1658 Last data filed at 09/03/14 1408  Gross per 24 hour  Intake 2331.25 ml  Output    525 ml  Net 1806.25 ml    Exam:   General:  Pt is alert, follows commands appropriately, not in acute distress  Cardiovascular: Regular rate and rhythm, S1/S2, no murmurs, no rubs, no gallops  Respiratory: Clear to auscultation bilaterally, no wheezing, no crackles, no rhonchi  Abdomen: Soft, slightly tender in epigastric area, non distended, bowel sounds present, no guarding  Extremities: No edema, pulses DP and PT palpable bilaterally  Neuro: still tremulous on exam but overall non focal   Data Reviewed: Basic Metabolic Panel:  Recent Labs Lab 08/30/14 0345 08/31/14 0518 09/01/14 0338 09/02/14 0400 09/03/14 0400  NA 132* 137 136* 133* 134*  K 3.4* 3.2* 3.5* 4.2 3.8  CL 95* 99 96 96 98  CO2 22 26 28 27 25   GLUCOSE 140* 138* 141* 196* 149*  BUN 9 3* 4* 5* 5*  CREATININE 0.70 0.62 0.64 0.67 0.62  CALCIUM 8.2* 8.0* 7.9* 8.2* 8.5   Liver Function Tests:  Recent Labs Lab 08/28/14 0630 08/30/14 0345 08/31/14 0518 09/02/14 0400 09/03/14 0400  AST 362* 257* 291* 249* 209*  ALT 175* 143* 155* 156* 150*  ALKPHOS 281* 305* 297* 298* 309*  BILITOT 1.8* 2.0* 1.6* 1.2 1.0  PROT 5.5* 5.6* 5.2* 5.4* 5.8*  ALBUMIN 2.9* 2.8* 2.7* 2.7* 2.9*    Recent Labs Lab 08/28/14 0630 08/30/14 0345 08/31/14 0518 09/02/14 0400 09/03/14 0400  LIPASE 96* 115* 100* 98* 53   CBC:  Recent Labs Lab 08/28/14 0630  08/30/14 0345 08/31/14 0518 09/01/14 0338 09/02/14 0400 09/03/14 0400  WBC 5.3  < > 3.5* 3.2* 2.8* 2.9* 3.2*  NEUTROABS 3.8  --   --   --   --   --   --   HGB 10.4*  < > 9.4* 8.9* 9.4* 9.6* 10.2*  HCT 29.0*  < > 27.1* 25.8* 27.1* 29.4* 30.0*  MCV 94.2  < > 97.5 100.8* 99.6 103.2* 102.0*  PLT 128*  < > 112* 106* 130* 129* 155  < > = values  in this interval not displayed. Cardiac Enzymes:  Recent Labs Lab 08/28/14 0630  CKTOTAL 246*  TROPONINI <0.30   CBG:  Recent Labs Lab 09/02/14 1625 09/02/14 2124 09/03/14 0747 09/03/14 1148 09/03/14 1634  GLUCAP 161* 193* 178* 124* 140*    Recent Results (from the past 240 hour(s))  MRSA PCR SCREENING     Status: None   Collection Time    08/28/14  6:06 AM      Result Value Ref Range Status   MRSA by PCR NEGATIVE  NEGATIVE Final   Comment:            The GeneXpert MRSA Assay (FDA     approved for NASAL specimens     only), is one component of a     comprehensive MRSA colonization  surveillance program. It is not     intended to diagnose MRSA     infection nor to guide or     monitor treatment for     MRSA infections.  CULTURE, BLOOD (ROUTINE X 2)     Status: None   Collection Time    08/28/14  6:30 AM      Result Value Ref Range Status   Specimen Description BLOOD RIGHT ARM   Final   Special Requests BOTTLES DRAWN AEROBIC ONLY 5CC   Final   Culture  Setup Time     Final   Value: 08/28/2014 11:53     Performed at [facility]   Culture     Final   Value: NO GROWTH 5 DAYS     Performed at [facility]   Report Status 09/03/2014 FINAL   Final  CULTURE, BLOOD (ROUTINE X 2)     Status: None   Collection Time    08/28/14  6:40 AM      Result Value Ref Range Status   Specimen Description BLOOD RIGHT ARM   Final   Special Requests BOTTLES DRAWN AEROBIC ONLY 5CC   Final   Culture  Setup Time     Final   Value: 08/28/2014 11:53     Performed at [facility]   Culture     Final   Value: NO GROWTH 5 DAYS     Performed at [facility]   Report Status 09/03/2014 FINAL   Final     Scheduled Meds: . escitalopram  20 mg Oral Daily  . folic acid  1 mg Oral Daily  . hydrALAZINE  25 mg Oral 3 times per day  . insulin aspart  0-15 Units Subcutaneous TID WC  . insulin aspart  0-5 Units Subcutaneous QHS  . insulin aspart  3 Units  Subcutaneous TID WC  . linagliptin  5 mg Oral Daily  . multivitamin   1 tablet Oral Daily  . thiamine  100 mg Oral Daily   Continuous Infusions: . sodium chloride 75 mL/hr at 09/02/14 1949

## 2014-09-04 LAB — COMPREHENSIVE METABOLIC PANEL
ALT: 138 U/L — ABNORMAL HIGH (ref 0–35)
AST: 180 U/L — ABNORMAL HIGH (ref 0–37)
Albumin: 2.9 g/dL — ABNORMAL LOW (ref 3.5–5.2)
Alkaline Phosphatase: 310 U/L — ABNORMAL HIGH (ref 39–117)
Anion gap: 11 (ref 5–15)
BUN: 8 mg/dL (ref 6–23)
CO2: 24 mEq/L (ref 19–32)
Calcium: 8.3 mg/dL — ABNORMAL LOW (ref 8.4–10.5)
Chloride: 100 mEq/L (ref 96–112)
Creatinine, Ser: 0.61 mg/dL (ref 0.50–1.10)
GFR calc Af Amer: >90 mL/min (ref >90)
GFR calc non Af Amer: >90 mL/min (ref >90)
Glucose, Bld: 186 mg/dL — ABNORMAL HIGH (ref 70–99)
Potassium: 3.8 mEq/L (ref 3.7–5.3)
Sodium: 135 mEq/L — ABNORMAL LOW (ref 137–147)
Total Bilirubin: 0.9 mg/dL (ref 0.3–1.2)
Total Protein: 5.9 g/dL — ABNORMAL LOW (ref 6.0–8.3)

## 2014-09-04 LAB — CBC
HCT: 32 % — ABNORMAL LOW (ref 36.0–46.0)
Hemoglobin: 10.3 g/dL — ABNORMAL LOW (ref 12.0–15.0)
MCH: 34.1 pg — ABNORMAL HIGH (ref 26.0–34.0)
MCHC: 32.2 g/dL (ref 30.0–36.0)
MCV: 106 fL — ABNORMAL HIGH (ref 78.0–100.0)
Platelets: 175 10*3/uL (ref 150–400)
RBC: 3.02 MIL/uL — ABNORMAL LOW (ref 3.87–5.11)
RDW: 18 % — ABNORMAL HIGH (ref 11.5–15.5)
WBC: 3.2 10*3/uL — ABNORMAL LOW (ref 4.0–10.5)

## 2014-09-04 LAB — GLUCOSE, CAPILLARY
Glucose-Capillary: 214 mg/dL — ABNORMAL HIGH (ref 70–99)
Glucose-Capillary: 188 mg/dL — ABNORMAL HIGH (ref 70–99)
Glucose-Capillary: 155 mg/dL — ABNORMAL HIGH (ref 70–99)
Glucose-Capillary: 159 mg/dL — ABNORMAL HIGH (ref 70–99)

## 2014-09-04 LAB — LIPASE, BLOOD: Lipase: 44 U/L (ref 11–59)

## 2014-09-04 MED ORDER — NITROFURANTOIN MONOHYD MACRO 100 MG PO CAPS
100.0000 mg | ORAL_CAPSULE | Freq: Two times a day (BID) | ORAL | Status: DC
Start: 2014-09-05 — End: 2014-09-05
  Administered 2014-09-05: 100 mg via ORAL
  Filled 2014-09-04 (×2): qty 1

## 2014-09-04 MED ORDER — DEXTROSE 5 % IV SOLN
1.0000 g | INTRAVENOUS | Status: DC
  Administered 2014-09-04: 1 g via INTRAVENOUS
  Filled 2014-09-04: qty 10

## 2014-09-04 NOTE — Progress Notes (Signed)
Clinical Social Work  CSW followed up with patient to offer support during hospital stay. Patient reports she is feeling better and hopeful to DC soon. Patient reports that she is still talking with family but has not made any definite plans of when she would move back to [place]. Patient reports that ex-husband is still somewhat supportive and reports that he will drive her to [place] meetings for the women's group. CSW spoke with patient about SA treatment options again. Patient continues to refuse residential treatment and reports that she will follow up with AA meetings until she moves back to [place]. Patient thanked CSW for visit but reports she does not have any needs and does not need CSW assistance at this time.  CSW will continue to follow.  [place], [place] [phone]

## 2014-09-04 NOTE — Progress Notes (Signed)
PROGRESS NOTE  [name] ESP:[id number] DOB: 02/20/1946 DOA: 08/27/2014 PCP: [name], MD  HPI: 68 y.o. female with HTN, DM type II, alcohol abuse, brought to [facility] ED by her husband for alcohol detoxication. He explained that pt has been drinking wine more than usual and has been having abdominal pain. Pt has been fairly poor historian since admission, not much verbal and refusing some medical care recommended.   Subjective/ 24 H Interval events - no overnight events,  - endorses weakness this morning  Assessment/Plan: Acute alcohol intoxication with delirium tremens / Alcohol abuse  Pt continues to be slightly tremulous on physical exam but better overall  Psychiatry consulted, no evidence of imminent risk to self or others and therefor psych did not recommend inpatient psych  Abnormal LFT's / Alcoholic hepatitis  Likely secondary to alcohol induced hepatitis  Slightly better compared to the admission values but still elevated  abdominal US with hepatic steatosis  Weakness  Likely in the setting of acute hospitalization, long standing ETOH  PT consult Acute pancreatitis  Alcohol induced  Lipase is trending down and it is WNL now Supportive care with IV fluids, antiemetics as needed  Advance diet to regular this AM  DIABETES MELLITUS, TYPE II  Continue Tradjenta and SSI  Increase coverage to moderate with night coverage as well  CBG's remain reasonably stable in 120 - 160's  HYPERTENSION  SBP in 100's this AM  Placed on Hydralazine scheduled and as needed, will decrease the dose of Hydralazine from 25 mg TID to 10 mg TID PO Hypokalemia  Supplemented and WNL this AM Hyponatremia  Na remains stable between 133 - 136  Continue IVF and repeat BMP in AM  Anemia of chronic disease  Hg remains stable over the past 24 hours No signs of bleed  No current indications for transfusion  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  Stable, now normal 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 alcohol abuse and poor dietary habits  Advance diet to regular   Diet: regular Fluids: none DVT Prophylaxis: SCD  Code Status: Full Family Communication: d/w patient  Disposition Plan: home when ready, PT evaluation pending  Consultants:  Psychiatry   Procedures:  None    Antibiotics Ceftriaxone 9/23 x 1 Macrobid start 9/24  Studies  Filed Vitals:   09/03/14 1408 09/03/14 2151 09/04/14 0509 09/04/14 1416  BP: 105/48 129/59 138/63 121/62  Pulse: 96 95 95 95  Temp: 98.6 F (37 C) 97.8 F (36.6 C) 97.9 F (36.6 C) 97.9 F (36.6 C)  TempSrc: Oral Oral Oral Oral  Resp: 20 20 18 20   Height:      Weight:   77.883 kg (171 lb 11.2 oz)   SpO2: 96% 96% 97% 94%    Intake/Output Summary (Last 24 hours) at 09/04/14 1520 Last data filed at 09/04/14 1416  Gross per 24 hour  Intake   1080 ml  Output    950 ml  Net    130 ml   Filed Weights   09/02/14 0629 09/03/14 0444 09/04/14 0509  Weight: 75.4 kg (166 lb 3.6 oz) 77.2 kg (170 lb 3.1 oz) 77.883 kg (171 lb 11.2 oz)    Exam:  General:  NAD  Cardiovascular: RRR  Respiratory: CTA biL  Abdomen: soft, non tender  MSK: no edema  Data Reviewed: Basic Metabolic Panel:  Recent Labs Lab 08/31/14 0518 09/01/14 0338 09/02/14 0400 09/03/14 0400 09/04/14 0346  NA 137 136* 133* 134* 135*  K  3.2* 3.5* 4.2 3.8 3.8  CL 99 96 96 98 100  CO2 26 28 27 25 24   GLUCOSE 138* 141* 196* 149* 186*  BUN 3* 4* 5* 5* 8  CREATININE 0.62 0.64 0.67 0.62 0.61  CALCIUM 8.0* 7.9* 8.2* 8.5 8.3*   Liver Function Tests:  Recent Labs Lab 08/30/14 0345 08/31/14 0518 09/02/14 0400 09/03/14 0400 09/04/14 0346  AST 257* 291* 249* 209* 180*  ALT 143* 155* 156* 150* 138*  ALKPHOS 305* 297* 298* 309* 310*  BILITOT 2.0* 1.6* 1.2 1.0 0.9  PROT 5.6* 5.2* 5.4* 5.8* 5.9*  ALBUMIN 2.8* 2.7* 2.7* 2.9* 2.9*    Recent Labs Lab 08/30/14 0345 08/31/14 0518 09/02/14 0400 09/03/14 0400 09/04/14 0346  LIPASE 115*  100* 98* 53 44   No results found for this basename: AMMONIA,  in the last 168 hours CBC:  Recent Labs Lab 08/31/14 0518 09/01/14 0338 09/02/14 0400 09/03/14 0400 09/04/14 0346  WBC 3.2* 2.8* 2.9* 3.2* 3.2*  HGB 8.9* 9.4* 9.6* 10.2* 10.3*  HCT 25.8* 27.1* 29.4* 30.0* 32.0*  MCV 100.8* 99.6 103.2* 102.0* 106.0*  PLT 106* 130* 129* 155 175   Cardiac Enzymes: No results found for this basename: CKTOTAL, CKMB, CKMBINDEX, TROPONINI,  in the last 168 hours BNP (last 3 results) No results found for this basename: PROBNP,  in the last 8760 hours CBG:  Recent Labs Lab 09/03/14 1148 09/03/14 1634 09/03/14 2224 09/04/14 0736 09/04/14 1200  GLUCAP 124* 140* 159* 214* 188*    Recent Results (from the past 240 hour(s))  MRSA PCR SCREENING     Status: None   Collection Time    08/28/14  6:06 AM      Result Value Ref Range Status   MRSA by PCR NEGATIVE  NEGATIVE Final   Comment:            The GeneXpert MRSA Assay (FDA     approved for NASAL specimens     only), is one component of a     comprehensive MRSA colonization     surveillance program. It is not     intended to diagnose MRSA     infection nor to guide or     monitor treatment for     MRSA infections.  CULTURE, BLOOD (ROUTINE X 2)     Status: None   Collection Time    08/28/14  6:30 AM      Result Value Ref Range Status   Specimen Description BLOOD RIGHT ARM   Final   Special Requests BOTTLES DRAWN AEROBIC ONLY 5CC   Final   Culture  Setup Time     Final   Value: 08/28/2014 11:53     Performed at [facility]   Culture     Final   Value: NO GROWTH 5 DAYS     Performed at [facility]   Report Status 09/03/2014 FINAL   Final  CULTURE, BLOOD (ROUTINE X 2)     Status: None   Collection Time    08/28/14  6:40 AM      Result Value Ref Range Status   Specimen Description BLOOD RIGHT ARM   Final   Special Requests BOTTLES DRAWN AEROBIC ONLY 5CC   Final   Culture  Setup Time     Final   Value:  08/28/2014 11:53     Performed at [facility]   Culture     Final   Value: NO GROWTH 5 DAYS  Performed at [facility]   Report Status 09/03/2014 FINAL   Final     Studies: No results found.  Scheduled Meds: . antiseptic oral rinse  7 mL Mouth Rinse BID  . cefTRIAXone (ROCEPHIN)  IV  1 g Intravenous Q24H  . escitalopram  20 mg Oral Daily  . folic acid  1 mg Oral Daily  . hydrALAZINE  10 mg Oral 3 times per day  . insulin aspart  0-15 Units Subcutaneous TID WC  . insulin aspart  0-5 Units Subcutaneous QHS  . insulin aspart  3 Units Subcutaneous TID WC  . linagliptin  5 mg Oral Daily  . multivitamin with minerals  1 tablet Oral Daily  . sodium chloride  3 mL Intravenous Q12H  . thiamine  100 mg Oral Daily   Or  . thiamine  100 mg Intravenous Daily   Continuous Infusions: . sodium chloride 50 mL/hr at 09/03/14 1731    Principal Problem:   Alcohol intoxication Active Problems:   DIABETES MELLITUS, TYPE II   HYPERTENSION   Hyponatremia   Hepatitis   Time spent: [place], MD [facility] Pager [phone]. If 7 PM - 7 AM, please contact night-coverage at [email or web address], password [facility] 09/04/2014, 3:20 PM  LOS: 8 days

## 2014-09-05 LAB — GLUCOSE, CAPILLARY
Glucose-Capillary: 159 mg/dL — ABNORMAL HIGH (ref 70–99)
Glucose-Capillary: 195 mg/dL — ABNORMAL HIGH (ref 70–99)
Glucose-Capillary: 147 mg/dL — ABNORMAL HIGH (ref 70–99)

## 2014-09-05 MED ORDER — OXYCODONE HCL 10 MG PO TABS
10.0000 mg | ORAL_TABLET | Freq: Four times a day (QID) | ORAL | Status: DC | PRN
Start: 2014-09-05 — End: 2016-01-28

## 2014-09-05 MED ORDER — NITROFURANTOIN MONOHYD MACRO 100 MG PO CAPS: 100.0000 mg | ORAL_CAPSULE | Freq: Two times a day (BID) | ORAL | Status: DC

## 2014-09-05 MED ORDER — PROMETHAZINE HCL 12.5 MG PO TABS: 12.5000 mg | ORAL_TABLET | Freq: Four times a day (QID) | ORAL | Status: DC | PRN

## 2014-09-05 MED ORDER — PROMETHAZINE HCL 25 MG PO TABS
12.5000 mg | ORAL_TABLET | Freq: Four times a day (QID) | ORAL | Status: DC | PRN
  Filled 2014-09-05: qty 1

## 2014-09-05 NOTE — Progress Notes (Signed)
Assumed care of pt at this time. Pt stable and with no complaints. Pt to be discharged home, she is waiting on her husband to get off of work to pick her up. Will review discharge instructions once he arrives.

## 2014-09-05 NOTE — Discharge Summary (Signed)
Physician Discharge Summary  [name] KWI:[id number] DOB: 11/09/1946 DOA: 08/27/2014  PCP: [name], MD  Admit date: 08/27/2014 Discharge date: 09/05/2014  Time spent: 45 minutes  Recommendations for Outpatient Follow-up:  1. Follow up with PCP in 1 week 2. Follow up with home health agency   Recommendations for primary care physician for things to follow:  Repeat BMP, CBC, LFTs  Discharge Diagnoses:  Principal Problem:   Alcohol intoxication Active Problems:   DIABETES MELLITUS, TYPE II   HYPERTENSION   Hyponatremia   Hepatitis  Discharge Condition: stable  Diet recommendation: regular   Filed Weights   09/03/14 0444 09/04/14 0509 09/05/14 0459  Weight: 77.2 kg (170 lb 3.1 oz) 77.883 kg (171 lb 11.2 oz) 79.017 kg (174 lb 3.2 oz)    History of present illness:  [name] is a 68 y.o. female history of alcoholism drinks wine every night and has been drinking more than usual last 3 weeks with history of hypertension hyperlipidemia and diabetes mellitus type 2 was brought to the ER by patient's husband as patient has been drinking alcohol more than usual and wanted detox. Patient states she also has been having some abdominal discomfort mostly in the lower quadrants over the last 4 days with some nausea and vomiting and occasional diarrhea. Denies using any recent antibiotics. In the ER patient was mildly hypotensive and sodium was 118. Patient alcohol level was high and liver function was elevated. Patient has been admitted for further management of alcohol intoxication with impending delirium tremens and hyponatremia.  Patient denies any chest pain or shortness of breath fever chills. Patient states last few days she has not been eating well because of vomiting.  Hospital Course:  Acute alcohol intoxication with delirium tremens / Alcohol abuse  Pt experienced severe withdrawal initially, slowly improved. She does have a slight baseline tremor which is likely  due to long standing alcohol abuse.   Psychiatry consulted, no evidence of imminent risk to self or others and therefor psych did not recommend inpatient psych  Abnormal LFT's / Alcoholic hepatitis  Likely secondary to alcohol induced hepatitis  Slightly better compared to the admission values but still elevated, continues to improve abdominal US with hepatic steatosis  Weakness  Likely in the setting of acute hospitalization, long standing ETOH  PT consult, recommended SNF however patient declined, thus HHPT was arranged with CM assistance.  Acute pancreatitis  Alcohol induced  Lipase is trending down and returned to normal.  Patient with mild residual abdominal pain, able to tolerate a regular diet without nausea or vomiting.  DIABETES MELLITUS, TYPE II  Continue home medications HYPERTENSION  Continue Bystolic and Lisinopril  Hypokalemia  Supplemented and WNL  Hyponatremia  Na remains stable between 133 - 136.  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     diphenoxylate-atropine 2.5-0.025 MG per tablet  Commonly known as:  LOMOTIL  Take 1 tablet by mouth 4 (four) times daily as needed for diarrhea or loose stools.     escitalopram 20  MG tablet  Commonly known as:  LEXAPRO  Take 20 mg by mouth daily.     ezetimibe 10 MG tablet  Commonly known as:  ZETIA  Take 1 tablet (10 mg total) by mouth daily.     lisinopril 5 MG tablet  Commonly known as:  PRINIVIL,ZESTRIL  Take 1 tablet (5 mg total) by mouth daily.     LORazepam 0.5 MG tablet  Commonly known as:  ATIVAN  Take 1 tablet (0.5 mg total) by mouth every 8 (eight) hours as needed for anxiety.     nebivolol 5 MG tablet  Commonly known as:  BYSTOLIC  Take 1 tablet (5 mg total) by mouth daily.     nitrofurantoin (macrocrystal-monohydrate) 100 MG capsule  Commonly known as:  MACROBID  Take 1 capsule (100 mg total) by mouth every 12 (twelve) hours.     Oxycodone HCl 10 MG Tabs  Take 1 tablet (10 mg total) by mouth every 6 (six) hours as needed for moderate pain.     promethazine 12.5 MG tablet  Commonly known as:  PHENERGAN  Take 1 tablet (12.5 mg total) by mouth every 6 (six) hours as needed for nausea or vomiting.     sitaGLIPtin 100 MG tablet  Commonly known as:  JANUVIA  Take 1 tablet (100 mg total) by mouth daily.           Follow-up Information   Follow up with [name], MD. Schedule an appointment as soon as possible for a visit in 1 week.   Specialty:  Internal Medicine   Contact information:   [place] [id number] [phone]     The results of significant diagnostics from this hospitalization (including imaging, microbiology, ancillary and laboratory) are listed below for reference.    Significant Diagnostic Studies: Ct Abdomen Pelvis W Contrast  08/28/2014   CLINICAL DATA:  Abdominal discomfort, nausea, vomiting, intermittent diarrhea  EXAM: CT ABDOMEN AND PELVIS WITH CONTRAST  TECHNIQUE: Multidetector CT imaging of the abdomen and pelvis was performed using the standard protocol following bolus administration of intravenous contrast.  CONTRAST:  164mL OMNIPAQUE IOHEXOL 300 MG/ML  SOLN  COMPARISON:  11/06/10   FINDINGS: Sagittal images of the spine shows degenerative changes thoracolumbar spine. Lung bases are unremarkable. There is chronic elevation of the right hemidiaphragm. Mild hepatomegaly again noted. Again noted significant fatty infiltration of the liver. No focal hepatic mass. No calcified gallstones are noted within gallbladder. The pancreas, spleen and adrenal glands are unremarkable. 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 Small amount of right perinephric fluid/ stranding. The kidneys are symmetrical in enhancement  Delayed renal images shows bilateral renal symmetrical excretion. No focal renal mass. Mild atherosclerotic calcifications of abdominal aorta and iliac arteries. No small bowel obstruction. There is no pericecal inflammation. Nonspecific mild thickening of cecal wall surrounding the base of the appendix. The appendix appears normal. There is a distended urinary bladder with thickened wall. Mild cystitis cannot be excluded. Tiny amount of air within urinary bladder  may be post instrumentation. The uterus and adnexa are unremarkable.  No small bowel obstruction. The terminal ileum is unremarkable. No ascites or free air. No adenopathy. No inguinal adenopathy. No destructive bony lesions are noted within pelvis. Few diverticula are noted sigmoid colon. No evidence of acute diverticulitis.  IMPRESSION: 1. There is mild hepatomegaly. Significant fatty infiltration of the liver. 2. 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 Nonspecific mild right perinephric stranding/fluid. No hydronephrosis or hydroureter. Bilateral renal symmetrical excretion. 3. No small bowel or colonic obstruction. 4. There is a distended urinary bladder with mild thickening of the wall. Clinical correlation is necessary to exclude mild cystitis. Small amount of air within urinary bladder probable post instrumentation. 5. Normal appearing appendix.  No pericecal inflammation.   Electronically Signed   By: Lahoma Crocker M.D.    On: 08/28/2014 12:42   Dg Chest Port 1 View  08/28/2014   CLINICAL DATA:  Shortness of breath.  EXAM: PORTABLE CHEST - 1 VIEW  COMPARISON:  Chest radiograph performed 07/14/2014  FINDINGS: The lungs are hypoexpanded. Mild vascular congestion is noted. There is no evidence of pleural effusion or pneumothorax.  The cardiomediastinal silhouette is within normal limits. No acute osseous abnormalities are seen.  IMPRESSION: Lungs hypoexpanded. Mild vascular congestion noted. No focal consolidation seen.   Electronically Signed   By: Garald Balding M.D.   On: 08/28/2014 05:45   US Abdomen Limited Ruq  08/30/2014   CLINICAL DATA:  Abnormal liver function tests  EXAM: US ABDOMEN LIMITED - RIGHT UPPER QUADRANT  COMPARISON:  None.  FINDINGS: Gallbladder:  No gallstones or wall thickening visualized. No sonographic Murphy sign noted.  Common bile duct:  Diameter: 4 mm  Liver:  Hepatomegaly noted with diffusely increased echogenicity suggesting steatosis.  IMPRESSION: Hepatic steatosis without focal abnormality or acute finding.   Electronically Signed   By: Conchita Paris M.D.   On: 08/30/2014 07:10    Microbiology: Recent Results (from the past 240 hour(s))  MRSA PCR SCREENING     Status: None   Collection Time    08/28/14  6:06 AM      Result Value Ref Range Status   MRSA by PCR NEGATIVE  NEGATIVE Final   Comment:            The GeneXpert MRSA Assay (FDA     approved for NASAL specimens     only), is one component of a     comprehensive MRSA colonization     surveillance program. It is not     intended to diagnose MRSA     infection nor to guide or     monitor treatment for     MRSA infections.  CULTURE, BLOOD (ROUTINE X 2)     Status: None   Collection Time    08/28/14  6:30 AM      Result Value Ref Range Status   Specimen Description BLOOD RIGHT ARM   Final   Special Requests BOTTLES DRAWN AEROBIC ONLY 5CC   Final   Culture  Setup Time     Final   Value: 08/28/2014 11:53     Performed at  [facility]   Culture     Final   Value: NO GROWTH 5 DAYS     Performed at [facility]   Report Status 09/03/2014 FINAL   Final  CULTURE, BLOOD (ROUTINE X 2)     Status: None   Collection Time    08/28/14  6:40 AM  Result Value Ref Range Status   Specimen Description BLOOD RIGHT ARM   Final   Special Requests BOTTLES DRAWN AEROBIC ONLY 5CC   Final   Culture  Setup Time     Final   Value: 08/28/2014 11:53     Performed at [facility]   Culture     Final   Value: NO GROWTH 5 DAYS     Performed at [facility]   Report Status 09/03/2014 FINAL   Final  URINE CULTURE     Status: None   Collection Time    09/04/14 11:04 AM      Result Value Ref Range Status   Specimen Description URINE, CLEAN CATCH   Final   Special Requests NONE   Final   Culture  Setup Time     Final   Value: 09/04/2014 14:18     Performed at [place]     Final   Value: >=100,000 COLONIES/ML     Performed at [facility]   Culture     Final   Value: ESCHERICHIA COLI     Performed at [facility]   Report Status PENDING   Incomplete     Labs: Basic Metabolic Panel:  Recent Labs Lab 08/31/14 0518 09/01/14 0338 09/02/14 0400 09/03/14 0400 09/04/14 0346  NA 137 136* 133* 134* 135*  K 3.2* 3.5* 4.2 3.8 3.8  CL 99 96 96 98 100  CO2 26 28 27 25 24   GLUCOSE 138* 141* 196* 149* 186*  BUN 3* 4* 5* 5* 8  CREATININE 0.62 0.64 0.67 0.62 0.61  CALCIUM 8.0* 7.9* 8.2* 8.5 8.3*   Liver Function Tests:  Recent Labs Lab 08/30/14 0345 08/31/14 0518 09/02/14 0400 09/03/14 0400 09/04/14 0346  AST 257* 291* 249* 209* 180*  ALT 143* 155* 156* 150* 138*  ALKPHOS 305* 297* 298* 309* 310*  BILITOT 2.0* 1.6* 1.2 1.0 0.9  PROT 5.6* 5.2* 5.4* 5.8* 5.9*  ALBUMIN 2.8* 2.7* 2.7* 2.9* 2.9*    Recent Labs Lab 08/30/14 0345 08/31/14 0518 09/02/14 0400 09/03/14 0400 09/04/14 0346  LIPASE 115* 100* 98* 53 44   No results found for  this basename: AMMONIA,  in the last 168 hours CBC:  Recent Labs Lab 08/31/14 0518 09/01/14 0338 09/02/14 0400 09/03/14 0400 09/04/14 0346  WBC 3.2* 2.8* 2.9* 3.2* 3.2*  HGB 8.9* 9.4* 9.6* 10.2* 10.3*  HCT 25.8* 27.1* 29.4* 30.0* 32.0*  MCV 100.8* 99.6 103.2* 102.0* 106.0*  PLT 106* 130* 129* 155 175   CBG:  Recent Labs Lab 09/04/14 1644 09/04/14 2107 09/05/14 0740 09/05/14 1145 09/05/14 1727  GLUCAP 155* 159* 159* 195* 147*   Signed:  Asencion Guisinger  [facility] 09/05/2014, 6:29 PM

## 2014-09-05 NOTE — Progress Notes (Signed)
CSW reviewed PT evaluation recommending SNF at discharge. CSW spoke with patient who states that she plans to return home with her husband at discharge. RNCM, Juliann Pulse has made arrangements for DME & HH. Husband to transport home.   No further CSW needs identified - CSW signing off.   Raynaldo Opitz, [facility] Clinical Social Worker cell #: [phone]

## 2014-09-05 NOTE — Progress Notes (Signed)
[facility]   [facility] is providing the following services: RW and [facility]mode  If patient discharges after hours, please call [phone].   Linward Headland 09/05/2014, 12:26 PM

## 2014-09-05 NOTE — Evaluation (Signed)
Physical Therapy Evaluation Patient Details Name: [name] MRN: [id number] DOB: 06/26/1946 Today's Date: 09/05/2014   History of Present Illness  Pt is a 68 year old female admitted for acute alcohol intoxication with delirium tremens with hx of alcohol abuse, DM, HTN.  Clinical Impression  Pt currently with functional limitations due to the deficits listed below (see PT Problem List).  Pt will benefit from skilled PT to increase their independence and safety with mobility to allow discharge to the venue listed below.  Pt presents with unsteady gait, increased fall risk and generalized weakness therefore recommend SNF however pt not agreeable at this time.  Pt reports no one to assist her at home however her spouse (separated) may be able to "rent" RW and BSC.  Also recommend HHPT if d/c home.     Follow Up Recommendations SNF;Supervision/Assistance - 24 hour    Equipment Recommendations  Rolling walker with 5" wheels;3in1 (PT)    Recommendations for Other Services       Precautions / Restrictions Precautions Precautions: Fall Restrictions Weight Bearing Restrictions: No      Mobility  Bed Mobility Overal bed mobility: Modified Independent             General bed mobility comments: increased time and effort  Transfers Overall transfer level: Needs assistance Equipment used: Rolling walker (2 wheeled) Transfers: Sit to/from [facility] Sit to Stand: Min assist Stand pivot transfers: Min assist       General transfer comment: assist to rise and steady, verbal cues for hand placement, bed to [facility] then recliner to bed (pt reports abdominal pain better in bed)  Ambulation/Gait Ambulation/Gait assistance: Min assist Ambulation Distance (Feet): 15 Feet Assistive device: Rolling walker (2 wheeled) Gait Pattern/deviations: Step-through pattern;Trunk flexed;Decreased stride length Gait velocity: decr   General Gait Details: increased reliance on UEs  and shaky UEs observed during gait, pt only able to tolerate 10 feet, very unsteady  Stairs            Wheelchair Mobility    Modified Rankin (Stroke Patients Only)       Balance                                             Pertinent Vitals/Pain Pain Assessment: 0-10 Pain Location: abdominal pain, did not rate Pain Descriptors / Indicators: Aching Pain Intervention(s): Limited activity within patient's tolerance;Monitored during session    Home Living Family/patient expects to be discharged to:: Private residence Living Arrangements: Alone     Home Access: Level entry     Home Layout: One level Home Equipment: None Additional Comments: separated from spouse    Prior Function Level of Independence: Independent               Hand Dominance        Extremity/Trunk Assessment               Lower Extremity Assessment: Generalized weakness         Communication   Communication: No difficulties  Cognition Arousal/Alertness: Awake/alert Behavior During Therapy: Flat affect                        General Comments      Exercises        Assessment/Plan    PT Assessment Patient needs continued PT services  PT  Diagnosis Difficulty walking;Generalized weakness   PT Problem List Decreased strength;Decreased activity tolerance;Decreased balance;Decreased mobility;Decreased knowledge of use of DME  PT Treatment Interventions DME instruction;Gait training;Functional mobility training;Therapeutic activities;Therapeutic exercise;Patient/family education;Balance training   PT Goals (Current goals can be found in the Care Plan section) Acute Rehab PT Goals PT Goal Formulation: With patient Time For Goal Achievement: 09/19/14 Potential to Achieve Goals: Good    Frequency Min 3X/week   Barriers to discharge        Co-evaluation               End of Session Equipment Utilized During Treatment: Gait  belt Activity Tolerance: Patient limited by fatigue Patient left: in bed;with call bell/phone within reach;with bed alarm set           Time: 1047-1100 PT Time Calculation (min): 13 min   Charges:   PT Evaluation $Initial PT Evaluation Tier I: 1 Procedure PT Treatments $Therapeutic Activity: 8-22 mins   PT G Codes:          Davarius Ridener,KATHrine E 09/05/2014, 12:05 PM Carmelia Bake, PT, DPT 09/05/2014 Pager: [phone]

## 2014-09-06 LAB — URINE CULTURE: Colony Count: >=100000

## 2014-09-07 ENCOUNTER — Emergency Department (HOSPITAL_COMMUNITY)
Admission: EM | Admit: 2014-09-07 | Discharge: 2014-09-07 | Disposition: A | Discharge status: Home / Self Care | Payer: 59 | Attending: Emergency Medicine | Admitting: Emergency Medicine

## 2014-09-07 ENCOUNTER — Encounter (HOSPITAL_COMMUNITY): Payer: Self-pay | Admitting: Emergency Medicine

## 2014-09-07 DIAGNOSIS — R110 Nausea: Secondary | ICD-10-CM

## 2014-09-07 DIAGNOSIS — R1084 Generalized abdominal pain: Secondary | ICD-10-CM | POA: Diagnosis present

## 2014-09-07 DIAGNOSIS — Z8739 Personal history of other diseases of the musculoskeletal system and connective tissue: Secondary | ICD-10-CM | POA: Insufficient documentation

## 2014-09-07 DIAGNOSIS — Z79899 Other long term (current) drug therapy: Secondary | ICD-10-CM | POA: Diagnosis not present

## 2014-09-07 DIAGNOSIS — E119 Type 2 diabetes mellitus without complications: Secondary | ICD-10-CM | POA: Diagnosis not present

## 2014-09-07 DIAGNOSIS — Z87891 Personal history of nicotine dependence: Secondary | ICD-10-CM | POA: Diagnosis not present

## 2014-09-07 DIAGNOSIS — F3289 Other specified depressive episodes: Secondary | ICD-10-CM | POA: Insufficient documentation

## 2014-09-07 DIAGNOSIS — I10 Essential (primary) hypertension: Secondary | ICD-10-CM | POA: Insufficient documentation

## 2014-09-07 DIAGNOSIS — I2510 Atherosclerotic heart disease of native coronary artery without angina pectoris: Secondary | ICD-10-CM | POA: Insufficient documentation

## 2014-09-07 DIAGNOSIS — Z8719 Personal history of other diseases of the digestive system: Secondary | ICD-10-CM | POA: Diagnosis not present

## 2014-09-07 DIAGNOSIS — Z91040 Latex allergy status: Secondary | ICD-10-CM | POA: Diagnosis not present

## 2014-09-07 DIAGNOSIS — F329 Major depressive disorder, single episode, unspecified: Secondary | ICD-10-CM | POA: Diagnosis not present

## 2014-09-07 DIAGNOSIS — R609 Edema, unspecified: Secondary | ICD-10-CM | POA: Insufficient documentation

## 2014-09-07 LAB — LIPASE, BLOOD: Lipase: 31 U/L (ref 11–59)

## 2014-09-07 LAB — CBC WITH DIFFERENTIAL/PLATELET
Basophils Absolute: 0.1 10*3/uL (ref 0.0–0.1)
Basophils Relative: 2 % — ABNORMAL HIGH (ref 0–1)
Eosinophils Absolute: 0.1 10*3/uL (ref 0.0–0.7)
Eosinophils Relative: 1 % (ref 0–5)
HCT: 31.6 % — ABNORMAL LOW (ref 36.0–46.0)
Hemoglobin: 10.2 g/dL — ABNORMAL LOW (ref 12.0–15.0)
Lymphocytes Relative: 24 % (ref 12–46)
Lymphs Abs: 1.1 10*3/uL (ref 0.7–4.0)
MCH: 34.5 pg — ABNORMAL HIGH (ref 26.0–34.0)
MCHC: 32.3 g/dL (ref 30.0–36.0)
MCV: 106.8 fL — ABNORMAL HIGH (ref 78.0–100.0)
Monocytes Absolute: 0.6 10*3/uL (ref 0.1–1.0)
Monocytes Relative: 12 % (ref 3–12)
Neutro Abs: 2.9 10*3/uL (ref 1.7–7.7)
Neutrophils Relative %: 61 % (ref 43–77)
Platelets: 279 10*3/uL (ref 150–400)
RBC: 2.96 MIL/uL — ABNORMAL LOW (ref 3.87–5.11)
RDW: 18.9 % — ABNORMAL HIGH (ref 11.5–15.5)
WBC: 4.7 10*3/uL (ref 4.0–10.5)

## 2014-09-07 LAB — ETHANOL: Alcohol, Ethyl (B): <11 mg/dL (ref 0–11)

## 2014-09-07 LAB — COMPREHENSIVE METABOLIC PANEL
ALT: 88 U/L — ABNORMAL HIGH (ref 0–35)
AST: 80 U/L — ABNORMAL HIGH (ref 0–37)
Albumin: 3.1 g/dL — ABNORMAL LOW (ref 3.5–5.2)
Alkaline Phosphatase: 229 U/L — ABNORMAL HIGH (ref 39–117)
Anion gap: 15 (ref 5–15)
BUN: 5 mg/dL — ABNORMAL LOW (ref 6–23)
CO2: 23 mEq/L (ref 19–32)
Calcium: 8.7 mg/dL (ref 8.4–10.5)
Chloride: 100 mEq/L (ref 96–112)
Creatinine, Ser: 0.59 mg/dL (ref 0.50–1.10)
GFR calc Af Amer: >90 mL/min (ref >90)
GFR calc non Af Amer: >90 mL/min (ref >90)
Glucose, Bld: 183 mg/dL — ABNORMAL HIGH (ref 70–99)
Potassium: 3.4 mEq/L — ABNORMAL LOW (ref 3.7–5.3)
Sodium: 138 mEq/L (ref 137–147)
Total Bilirubin: 0.7 mg/dL (ref 0.3–1.2)
Total Protein: 6 g/dL (ref 6.0–8.3)

## 2014-09-07 MED ORDER — SODIUM CHLORIDE 0.9 % IV BOLUS (SEPSIS)
1000.0000 mL | Freq: Once | INTRAVENOUS | Status: AC
  Administered 2014-09-07: 1000 mL via INTRAVENOUS

## 2014-09-07 MED ORDER — OXYCODONE HCL 10 MG PO TABS: 10.0000 mg | ORAL_TABLET | Freq: Four times a day (QID) | ORAL | Status: DC

## 2014-09-07 MED ORDER — HYDROMORPHONE HCL 1 MG/ML IJ SOLN
1.0000 mg | Freq: Once | INTRAMUSCULAR | Status: AC
  Administered 2014-09-07: 1 mg via INTRAVENOUS
  Filled 2014-09-07: qty 1

## 2014-09-07 MED ORDER — ONDANSETRON HCL 4 MG/2ML IJ SOLN
4.0000 mg | Freq: Once | INTRAMUSCULAR | Status: AC
  Administered 2014-09-07: 4 mg via INTRAVENOUS
  Filled 2014-09-07: qty 2

## 2014-09-07 NOTE — ED Provider Notes (Signed)
CSN: [id number]     Arrival date & time 09/07/14  1445 History   First MD Initiated Contact with Patient 09/07/14 1504     Chief Complaint  Patient presents with  . Abdominal Pain  . Pancreatitis  . Leg Swelling     (Consider location/radiation/quality/duration/timing/severity/associated sxs/prior Treatment) HPI Comments: Patient with a history of Alcoholism, Pancreatitis, and DM presents today with a chief complaint of abdominal pain.  Pain is generalized, but she reports that the pain is worse across her lower abdomen.  She states that a visiting nurse came to the home today to assess her and told her to come to the ED due to the severity of the abdominal pain.  She states that she has been taking Oxycodone for the pain, which helps but she reports that she has had to double up on her dose to get pain relief.  She reports associated nausea, but denies vomiting.  Denies diarrhea or constipation.  Denies fever or chills.  Denies chest pain.  She denies any recent alcohol use.  Patient was hospitalized for Hyponatemia, Pancreatitis, and Alcoholism with DT's from 08/27/14-09/05/14.  She reports that she has not had an alcohol since she was discharged from the hospital.  She denies any urinary symptoms.  She is currently being treated for a UTI with Macrobid.  Patient is a 68 y.o. female presenting with abdominal pain. The history is provided by the patient.  Abdominal Pain   Past Medical History  Diagnosis Date  . Alcohol abuse   . Diabetes mellitus   . Hypertension   . Fibromyalgia   . Depression   . Carotid artery stenosis   . Diverticulitis   . Hepatitis 08/28/2014   Past Surgical History  Procedure Laterality Date  . Cesarean section    . Tonsillectomy    . Bunionectomy     Family History  Problem Relation Age of Onset  . Cancer Mother   . Heart disease Father   . Heart disease Sister    History  Substance Use Topics  . Smoking status: Former Smoker    Quit date: 03/11/2010   . Smokeless tobacco: Never Used  . Alcohol Use: Yes     Comment: 4-5 bottles of wine per day / 2-3 quarts per day   OB History   Grav Para Term Preterm Abortions TAB SAB Ect Mult Living                 Review of Systems  Gastrointestinal: Positive for abdominal pain.  All other systems reviewed and are negative.     Allergies  Latex; Lipitor; Codeine; Pentazocine lactate; and Sulfonamide derivatives  Home Medications   Prior to Admission medications   Medication Sig Start Date End Date Taking? Authorizing Provider  amphetamine-dextroamphetamine (ADDERALL XR) 30 MG 24 hr capsule  11/25/10  Yes Historical Provider, MD  b complex vitamins tablet Take 1 tablet by mouth daily.    Historical Provider, MD  diphenoxylate-atropine (LOMOTIL) 2.5-0.025 MG per tablet Take 1 tablet by mouth 4 (four) times daily as needed for diarrhea or loose stools. 07/19/14   [name], MD  escitalopram (LEXAPRO) 20 MG tablet Take 20 mg by mouth daily.    Historical Provider, MD  ezetimibe (ZETIA) 10 MG tablet Take 1 tablet (10 mg total) by mouth daily. 07/09/14   Philemon Kingdom, MD  lisinopril (PRINIVIL,ZESTRIL) 5 MG tablet Take 1 tablet (5 mg total) by mouth daily. 07/09/14   Philemon Kingdom, MD  LORazepam (ATIVAN) 0.5  MG tablet Take 1 tablet (0.5 mg total) by mouth every 8 (eight) hours as needed for anxiety. 07/17/14   Shanker Kristeen Mans, MD  LYRICA 200 MG capsule Take 200 mg by mouth 2 (two) times daily.  06/13/14   Historical Provider, MD  nebivolol (BYSTOLIC) 5 MG tablet Take 1 tablet (5 mg total) by mouth daily. 07/09/14   Philemon Kingdom, MD  nitrofurantoin, macrocrystal-monohydrate, (MACROBID) 100 MG capsule Take 1 capsule (100 mg total) by mouth every 12 (twelve) hours. 09/05/14   Costin Karlyne Greenspan, MD  oxyCODONE 10 MG TABS Take 1 tablet (10 mg total) by mouth every 6 (six) hours as needed for moderate pain. 09/05/14   Costin Karlyne Greenspan, MD  promethazine (PHENERGAN) 12.5 MG tablet Take 1 tablet (12.5 mg  total) by mouth every 6 (six) hours as needed for nausea or vomiting. 09/05/14   Costin Karlyne Greenspan, MD  sitaGLIPtin (JANUVIA) 100 MG tablet Take 1 tablet (100 mg total) by mouth daily. 07/09/14   Philemon Kingdom, MD   BP 105/56  Pulse 77  Temp(Src) 99 F (37.2 C) (Oral)  Resp 18  Ht 5\' 3"  (1.6 m)  Wt 160 lb (72.576 kg)  BMI 28.35 kg/m2  SpO2 96% Physical Exam  Nursing note and vitals reviewed. Constitutional: She appears well-developed and well-nourished.  HENT:  Head: Normocephalic and atraumatic.  Eyes: EOM are normal. Pupils are equal, round, and reactive to light.  Neck: Normal range of motion. Neck supple.  Cardiovascular: Normal rate, regular rhythm and normal heart sounds.   Pulmonary/Chest: Effort normal and breath sounds normal.  Abdominal: Soft. Bowel sounds are normal. She exhibits no distension and no mass. There is tenderness. There is no rebound and no guarding.  Ecchymosis of the RUQ area, which patient's husband reports was present at discharge and is improving.  Musculoskeletal: Normal range of motion.  Mild bilateral lower extremity edema of the ankles.  No erythema or warmth.  Negative Homan's sign bilaterally  Neurological: She is alert.  Skin: Skin is warm and dry.  Psychiatric: She has a normal mood and affect.    ED Course  Procedures (including critical care time) Labs Review Labs Reviewed  CBC WITH DIFFERENTIAL  COMPREHENSIVE METABOLIC PANEL  LIPASE, BLOOD  URINALYSIS, ROUTINE W REFLEX MICROSCOPIC  ETHANOL    Imaging Review No results found.   EKG Interpretation None      MDM   Final diagnoses:  None   Patient with a history of Alcoholism, DM, and Pancreatitis presents today with a chief complaint of generalized abdominal pain.  On exam no localized tenderness.  No rebound or guarding.  Patient afebrile. VSS.  Labs are unremarkable.  LFT's are elevated, but improved from most recent labs.  Lipase WNL.  Abdominal pain and nausea improved  during ED course.  Patient able to tolerate PO liquids.  Feel that the patient is stable for discharge.  Return precautions given. Patient also evaluated by Dr. Wilson Singer who is in agreement with the plain.      Hyman Bible, PA-C 09/08/14 [phone]

## 2014-09-07 NOTE — ED Notes (Signed)
Pt aware of the need for a urine sample. Pt unable to void at this time.

## 2014-09-07 NOTE — ED Notes (Signed)
Assisted patient to restroom via wheelchair 

## 2014-09-07 NOTE — ED Notes (Signed)
Pt states she has been having severe abdominal pain and bilateral leg swelling since Thursday. Pt states she was discharged from [facility] ED on Thursday and was asked to follow-up with her PCP. Pt reports PCP advised her to go to ED and get seen by an Emergency Physician. Pt reports taking prescribed medications including Vicodin and Phenergan. Pt rates abdominal pain 7/10.

## 2014-09-07 NOTE — ED Notes (Signed)
PA at bedside.

## 2014-09-07 NOTE — ED Notes (Signed)
Pt wheeled to car with husband driving her home. Pt reports that she is leaving with ALL belongings she arrived with.

## 2014-09-07 NOTE — ED Notes (Signed)
Pt was asked if she could provide urine sample. Pt stated she does not need to go and knows when she has to go, I informed pt she has had 1000 mL bolus, and that she should at least something in her bladder, and if she could try.  Pt stated no, and I informed her that an in and out catheter may be ordered, pt stated refusal to catheter, because that is the reason she is here.

## 2014-09-11 ENCOUNTER — Emergency Department (INDEPENDENT_AMBULATORY_CARE_PROVIDER_SITE_OTHER)
Admission: EM | Admit: 2014-09-11 | Discharge: 2014-09-11 | Disposition: A | Discharge status: Home / Self Care | Payer: 59 | Source: Home / Self Care | Attending: Emergency Medicine | Admitting: Emergency Medicine

## 2014-09-11 ENCOUNTER — Encounter (HOSPITAL_COMMUNITY): Payer: Self-pay | Admitting: Emergency Medicine

## 2014-09-11 DIAGNOSIS — K859 Acute pancreatitis without necrosis or infection, unspecified: Secondary | ICD-10-CM

## 2014-09-11 DIAGNOSIS — K852 Alcohol induced acute pancreatitis without necrosis or infection: Secondary | ICD-10-CM

## 2014-09-11 LAB — POCT I-STAT, CHEM 8
BUN: <3 mg/dL — ABNORMAL LOW (ref 6–23)
Calcium, Ion: 1.05 mmol/L — ABNORMAL LOW (ref 1.13–1.30)
Chloride: 103 mEq/L (ref 96–112)
Creatinine, Ser: 1.3 mg/dL — ABNORMAL HIGH (ref 0.50–1.10)
Glucose, Bld: 132 mg/dL — ABNORMAL HIGH (ref 70–99)
HCT: 40 % (ref 36.0–46.0)
Hemoglobin: 13.6 g/dL (ref 12.0–15.0)
Potassium: 4.1 mEq/L (ref 3.7–5.3)
Sodium: 139 mEq/L (ref 137–147)
TCO2: 26 mmol/L (ref 0–100)

## 2014-09-11 LAB — POCT URINALYSIS DIP (DEVICE)
Bilirubin Urine: NEGATIVE
Glucose, UA: NEGATIVE mg/dL
Hgb urine dipstick: NEGATIVE
Ketones, ur: NEGATIVE mg/dL
Leukocytes, UA: NEGATIVE
Nitrite: NEGATIVE
Protein, ur: NEGATIVE mg/dL
Specific Gravity, Urine: 1.01 (ref 1.005–1.030)
Urobilinogen, UA: 0.2 mg/dL (ref 0.0–1.0)
pH: 7 (ref 5.0–8.0)

## 2014-09-11 MED ORDER — OXYCODONE-ACETAMINOPHEN 5-325 MG PO TABS: ORAL_TABLET | ORAL | Status: DC

## 2014-09-11 MED ORDER — PROMETHAZINE HCL 25 MG PO TABS: 25.0000 mg | ORAL_TABLET | Freq: Four times a day (QID) | ORAL | Status: DC | PRN

## 2014-09-11 NOTE — ED Provider Notes (Signed)
Chief Complaint   Abdominal Pain   History of Present Illness   [name] is a 68 year old female with a history of acute pancreatitis, alcoholism, diabetes, and hypertension. She presented to the emergency room on September 15 wishing to be withdrawn from alcohol. She was intoxicated at the time. She was found to have acute pancreatitis, and remained in the hospital for 9 days, through September 24. Since discharge she's been back to the emergency room once, 4 days ago. At that time her lipase was normal, liver functions were improving, and creatinine was 0.59. Her sodium was normal, her abdomen was unremarkable. She was sent home with Percocet and Phenergan. She's run out of both of these. She has an appointment with her primary care Dr. in 5 days. She describes generalized abdominal pain rated 5/10 which is worse after meals. The pain radiates through the back. She's felt nauseated and some chills. She denies any vomiting or diarrhea. She notes some frequent urination. She was treated for urinary tract infection with Macrobid but is now out of this as well.  Review of Systems   Other than as noted above, the patient denies any of the following symptoms: Constitutional:  No fever, chills, weight loss or anorexia. Abdomen:  No nausea, vomiting, hematememesis, melena, diarrhea, or hematochezia. GU:  No dysuria, frequency, urgency, or hematuria. Gyn:  No vaginal discharge, itching, abnormal bleeding, dyspareunia, or pelvic pain.  [place]   Past medical history, family history, social history, meds, and allergies were reviewed. She is allergic to sulfa, Lipitor, and codeine. She takes Lexapro, lisinopril, lorazepam, Lyrica, diastolic, Januvia, Zetia, promethazine, and Percocet. Medical problems include alcohol abuse, type 2 diabetes, hypertension, fibromyalgia, depression, carotid artery stenosis, diverticulitis, and hepatitis.  Physical Exam     Vital signs:  BP 120/112  Pulse 85   Temp(Src) 98.2 F (36.8 C) (Oral)  Resp 12  SpO2 100% Gen:  Alert, oriented, in no distress. Lungs:  Breath sounds clear and equal bilaterally.  No wheezes, rales or rhonchi. Heart:  Regular rhythm.  No gallops or murmers.   Abdomen:  She has a bruise in her right upper quadrant which was noted previously. She states this is improving. Abdomen is soft, flat, nondistended. There is no tenderness, guarding, rebound. Bowel sounds are normally active. Extremities: No edema. Skin:  Clear, warm and dry.  No rash.  Labs   Results for orders placed during the hospital encounter of 09/11/14  POCT I-STAT, CHEM 8      Result Value Ref Range   Sodium 139  137 - 147 mEq/L   Potassium 4.1  3.7 - 5.3 mEq/L   Chloride 103  96 - 112 mEq/L   BUN <3 (*) 6 - 23 mg/dL   Creatinine, Ser 1.30 (*) 0.50 - 1.10 mg/dL   Glucose, Bld 132 (*) 70 - 99 mg/dL   Calcium, Ion 1.05 (*) 1.13 - 1.30 mmol/L   TCO2 26  0 - 100 mmol/L   Hemoglobin 13.6  12.0 - 15.0 g/dL   HCT 40.0  36.0 - 46.0 %  POCT URINALYSIS DIP (DEVICE)      Result Value Ref Range   Glucose, UA NEGATIVE  NEGATIVE mg/dL   Bilirubin Urine NEGATIVE  NEGATIVE   Ketones, ur NEGATIVE  NEGATIVE mg/dL   Specific Gravity, Urine 1.010  1.005 - 1.030   Hgb urine dipstick NEGATIVE  NEGATIVE   pH 7.0  5.0 - 8.0   Protein, ur NEGATIVE  NEGATIVE mg/dL   Urobilinogen,  UA 0.2  0.0 - 1.0 mg/dL   Nitrite NEGATIVE  NEGATIVE   Leukocytes, UA NEGATIVE  NEGATIVE   Assessment   The encounter diagnosis was Alcohol-induced acute pancreatitis.  No evidence of acute abdomen.  Plan     1.  Meds:  The following meds were prescribed:   New Prescriptions   OXYCODONE-ACETAMINOPHEN (PERCOCET) 5-325 MG PER TABLET    1 to 2 tablets every 6 hours as needed for pain.   PROMETHAZINE (PHENERGAN) 25 MG TABLET    Take 1 tablet (25 mg total) by mouth every 6 (six) hours as needed for nausea or vomiting.    2.  Patient Education/Counseling:  The patient was given appropriate  handouts, self care instructions, and instructed in symptomatic relief.  Return to primary care physician in 5 days.  3.  Follow up:  The patient was told to follow up here if no better in 3 to 4 days, or sooner if becoming worse in any way, and given some red flag symptoms such as worsening pain, fever, vomiting, or evidence of GI bleeding which would prompt immediate return.    Harden Mo, MD 09/11/14 2129

## 2014-09-11 NOTE — Discharge Instructions (Signed)

## 2014-09-11 NOTE — ED Notes (Signed)
C/o lower abd pain that radiates to [employer] area and to the back onset 2-3 weeks Seen at [facility] ER on 9/23-9/26; treated for pancreatitis Needing more pain meds Pain is 5/10 Alert, no signs of acute distress.

## 2014-09-11 NOTE — ED Notes (Signed)
On arrival, patient was advised she would best be served at the ED, where she would most likely be sent from here w a CC of abdominal pain. She c/o she was sent here from [facility] , since they could not see here today. Was advised this is like another MD office, and we are unable to properly evaluate abdominal pain. Patient persisting "This is not an emergency, I just need pain medicine" Patient was again advised we cannot properly evaluate the cause of her pain, and would most likely have to go to the ED

## 2014-09-12 LAB — URINE CULTURE
Colony Count: 30000
Special Requests: NORMAL

## 2014-09-12 NOTE — ED Provider Notes (Signed)
Medical screening examination/treatment/procedure(s) were performed by non-physician practitioner and as supervising physician I was immediately available for consultation/collaboration.   EKG Interpretation None       Virgel Manifold, MD 09/12/14 1344

## 2014-09-16 ENCOUNTER — Ambulatory Visit: Payer: 59 | Attending: Internal Medicine | Admitting: Internal Medicine

## 2014-09-16 ENCOUNTER — Encounter: Payer: Self-pay | Admitting: Internal Medicine

## 2014-09-16 VITALS — BP 113/69 | HR 88 | Temp 100.2°F | Resp 16 | Wt 165.0 lb

## 2014-09-16 DIAGNOSIS — R945 Abnormal results of liver function studies: Secondary | ICD-10-CM

## 2014-09-16 DIAGNOSIS — F1011 Alcohol abuse, in remission: Secondary | ICD-10-CM

## 2014-09-16 DIAGNOSIS — R7989 Other specified abnormal findings of blood chemistry: Secondary | ICD-10-CM

## 2014-09-16 DIAGNOSIS — R109 Unspecified abdominal pain: Secondary | ICD-10-CM

## 2014-09-16 DIAGNOSIS — F411 Generalized anxiety disorder: Secondary | ICD-10-CM | POA: Insufficient documentation

## 2014-09-16 DIAGNOSIS — G894 Chronic pain syndrome: Secondary | ICD-10-CM

## 2014-09-16 DIAGNOSIS — F1010 Alcohol abuse, uncomplicated: Secondary | ICD-10-CM

## 2014-09-16 DIAGNOSIS — I10 Essential (primary) hypertension: Secondary | ICD-10-CM | POA: Insufficient documentation

## 2014-09-16 MED ORDER — LORAZEPAM 0.5 MG PO TABS: 0.5000 mg | ORAL_TABLET | Freq: Every day | ORAL | Status: DC | PRN

## 2014-09-16 MED ORDER — TRAMADOL HCL 50 MG PO TABS: 50.0000 mg | ORAL_TABLET | Freq: Three times a day (TID) | ORAL | Status: DC | PRN

## 2014-09-16 MED ORDER — ONDANSETRON HCL 8 MG PO TABS: 8.0000 mg | ORAL_TABLET | Freq: Three times a day (TID) | ORAL | Status: DC | PRN

## 2014-09-16 NOTE — Progress Notes (Signed)
Patient here for follow up from hospital admission Patient was admitted with pancreatitis

## 2014-09-16 NOTE — Progress Notes (Signed)
MRN: [id number] Name: [name]  Sex: female Age: 68 y.o. DOB: 22-Jun-1946  Allergies: Latex; Lipitor; Codeine; Pentazocine lactate; and Sulfonamide derivatives  Chief Complaint  Patient presents with  . Hospitalization Follow-up    HPI: Patient is 68 y.o. female who has history of hypertension hyperlipidemia diabetes alcohol abuse, last month hospitalized with symptoms of abdominal pain nausea vomiting and occasional diarrhea, EMR reviewed her patient had alcohol-induced pancreatitis her lipase level was improving had abnormal LFTs most likely secondary to alcohol abuse, her hepatitis panel was negative CT abdomen  reported hepatic steatosis, patient was continued with her hypertension medication which as per patient she is taking regularly, since then she had been to the ER with similar symptoms of abdominal pain. Patient was also treated for UTI, denies any more urinary symptoms does complain of chronic pain and requesting pain medication, she also requesting  prescription for Zofran which helps her with nausea, she also history of anxiety and is requesting prescription for Xanax, I have advised patient to have followup with her psychiatrist.  Past Medical History  Diagnosis Date  . Alcohol abuse   . Diabetes mellitus   . Hypertension   . Fibromyalgia   . Depression   . Carotid artery stenosis   . Diverticulitis   . Hepatitis 08/28/2014    Past Surgical History  Procedure Laterality Date  . Cesarean section    . Tonsillectomy    . Bunionectomy        Medication List       This list is accurate as of: 09/16/14  5:55 PM.  Always use your most recent med list.               b complex vitamins tablet  Take 1 tablet by mouth daily.     diphenoxylate-atropine 2.5-0.025 MG per tablet  Commonly known as:  LOMOTIL  Take 1 tablet by mouth 4 (four) times daily as needed for diarrhea or loose stools.     escitalopram 20 MG tablet  Commonly known as:  LEXAPRO  Take  20 mg by mouth daily.     ezetimibe 10 MG tablet  Commonly known as:  ZETIA  Take 1 tablet (10 mg total) by mouth daily.     lisinopril 5 MG tablet  Commonly known as:  PRINIVIL,ZESTRIL  Take 1 tablet (5 mg total) by mouth daily.     LORazepam 0.5 MG tablet  Commonly known as:  ATIVAN  Take 1 tablet (0.5 mg total) by mouth daily as needed for anxiety.     LYRICA 200 MG capsule  Generic drug:  pregabalin  Take 200 mg by mouth 2 (two) times daily.     nebivolol 5 MG tablet  Commonly known as:  BYSTOLIC  Take 1 tablet (5 mg total) by mouth daily.     nitrofurantoin (macrocrystal-monohydrate) 100 MG capsule  Commonly known as:  MACROBID  Take 1 capsule (100 mg total) by mouth every 12 (twelve) hours.     ondansetron 8 MG tablet  Commonly known as:  ZOFRAN  Take 1 tablet (8 mg total) by mouth every 8 (eight) hours as needed for nausea or vomiting.     Oxycodone HCl 10 MG Tabs  Take 1 tablet (10 mg total) by mouth every 6 (six) hours as needed for moderate pain.     Oxycodone HCl 10 MG Tabs  Take 1 tablet (10 mg total) by mouth 4 (four) times daily.     oxyCODONE-acetaminophen 5-325  MG per tablet  Commonly known as:  PERCOCET  1 to 2 tablets every 6 hours as needed for pain.     promethazine 12.5 MG tablet  Commonly known as:  PHENERGAN  Take 1 tablet (12.5 mg total) by mouth every 6 (six) hours as needed for nausea or vomiting.     promethazine 25 MG tablet  Commonly known as:  PHENERGAN  Take 1 tablet (25 mg total) by mouth every 6 (six) hours as needed for nausea or vomiting.     sitaGLIPtin 100 MG tablet  Commonly known as:  JANUVIA  Take 1 tablet (100 mg total) by mouth daily.     traMADol 50 MG tablet  Commonly known as:  ULTRAM  Take 1 tablet (50 mg total) by mouth every 8 (eight) hours as needed for moderate pain.        Meds ordered this encounter  Medications  . LORazepam (ATIVAN) 0.5 MG tablet    Sig: Take 1 tablet (0.5 mg total) by mouth daily as  needed for anxiety.    Dispense:  20 tablet    Refill:  0  . traMADol (ULTRAM) 50 MG tablet    Sig: Take 1 tablet (50 mg total) by mouth every 8 (eight) hours as needed for moderate pain.    Dispense:  30 tablet    Refill:  0  . ondansetron (ZOFRAN) 8 MG tablet    Sig: Take 1 tablet (8 mg total) by mouth every 8 (eight) hours as needed for nausea or vomiting.    Dispense:  20 tablet    Refill:  1    Immunization History  Administered Date(s) Administered  . Influenza Whole 01/08/2004  . Influenza,inj,Quad PF,36+ Mos 09/12/2013  . Tdap 11/28/2013    Family History  Problem Relation Age of Onset  . Cancer Mother   . Heart disease Father   . Heart disease Sister     History  Substance Use Topics  . Smoking status: Former Smoker    Quit date: 03/11/2010  . Smokeless tobacco: Never Used  . Alcohol Use: Yes     Comment: 4-5 bottles of wine per day / 2-3 quarts per day    Review of Systems   As noted in HPI  Filed Vitals:   09/16/14 1708  BP: 113/69  Pulse: 88  Temp: 100.2 F (37.9 C)  Resp: 16    Physical Exam  Physical Exam  Constitutional: No distress.  Eyes: EOM are normal. Pupils are equal, round, and reactive to light.  Cardiovascular: Normal rate and regular rhythm.   Pulmonary/Chest: Breath sounds normal. No respiratory distress. She has no wheezes. She has no rales.  Abdominal: Soft. There is no tenderness. There is no rebound.    CBC    Component Value Date/Time   WBC 4.7 09/07/2014 1543   RBC 2.96* 09/07/2014 1543   HGB 13.6 09/11/2014 2011   HCT 40.0 09/11/2014 2011   PLT 279 09/07/2014 1543   MCV 106.8* 09/07/2014 1543   LYMPHSABS 1.1 09/07/2014 1543   MONOABS 0.6 09/07/2014 1543   EOSABS 0.1 09/07/2014 1543   BASOSABS 0.1 09/07/2014 1543    CMP     Component Value Date/Time   NA 139 09/11/2014 2011   K 4.1 09/11/2014 2011   CL 103 09/11/2014 2011   CO2 23 09/07/2014 1543   GLUCOSE 132* 09/11/2014 2011   GLUCOSE 123* 11/17/2006 1240   BUN <3*  09/11/2014 2011   CREATININE 1.30* 09/11/2014 2011  CREATININE 0.80 04/18/2014 1039   CALCIUM 8.7 09/07/2014 1543   PROT 6.0 09/07/2014 1543   ALBUMIN 3.1* 09/07/2014 1543   AST 80* 09/07/2014 1543   ALT 88* 09/07/2014 1543   ALKPHOS 229* 09/07/2014 1543   BILITOT 0.7 09/07/2014 1543   GFRNONAA >90 09/07/2014 1543   GFRAA >90 09/07/2014 1543    Lab Results  Component Value Date/Time   CHOL 202* 04/18/2014 10:39 AM    No components found with this basename: [id number]    Lab Results  Component Value Date/Time   AST 80* 09/07/2014  3:43 PM    Assessment and Plan  Abdominal pain, unspecified abdominal location Now improved, her lipase level was within normal range.  History of alcohol abuse As per patient since the discharge she has not drink alcohol.  Essential hypertension, benign - Plan: COMPLETE METABOLIC PANEL WITH GFR  Anxiety state - Plan: I have LORazepam (ATIVAN) 0.5 MG tablet, advised patient to have a followup appointment with her psychiatrist.  Chronic pain syndrome - Plan: traMADol (ULTRAM) 50 MG tablet, Ambulatory referral to Pain Clinic  Abnormal LFTs Will repeat LFTs.   Return in about 4 months (around 01/17/2015) for hypertension.  [name], MD

## 2014-09-17 ENCOUNTER — Ambulatory Visit: Payer: Self-pay | Admitting: Internal Medicine

## 2014-09-17 LAB — COMPLETE METABOLIC PANEL WITH GFR
ALT: 45 U/L — ABNORMAL HIGH (ref 0–35)
AST: 68 U/L — ABNORMAL HIGH (ref 0–37)
Albumin: 3.6 g/dL (ref 3.5–5.2)
Alkaline Phosphatase: 96 U/L (ref 39–117)
BUN: 12 mg/dL (ref 6–23)
CO2: 25 mEq/L (ref 19–32)
Calcium: 8.6 mg/dL (ref 8.4–10.5)
Chloride: 107 mEq/L (ref 96–112)
Creat: 0.81 mg/dL (ref 0.50–1.10)
GFR, Est African American: 88 mL/min
GFR, Est Non African American: 76 mL/min
Glucose, Bld: 108 mg/dL — ABNORMAL HIGH (ref 70–99)
Potassium: 4.8 mEq/L (ref 3.5–5.3)
Sodium: 140 mEq/L (ref 135–145)
Total Bilirubin: 0.8 mg/dL (ref 0.2–1.2)
Total Protein: 5.9 g/dL — ABNORMAL LOW (ref 6.0–8.3)

## 2014-09-18 ENCOUNTER — Telehealth: Payer: Self-pay | Admitting: *Deleted

## 2014-09-19 NOTE — Telephone Encounter (Signed)
Pt called.  Patient was just in the hospital for acute pancreatitis she needs pain medication would like to know if Dr. Cruzita [name] can order something for about 2 weeks to help with the pain. Please advise.

## 2014-09-19 NOTE — Telephone Encounter (Signed)
Called pt and advised her per Dr Gherghe's note.  

## 2014-09-19 NOTE — Telephone Encounter (Signed)
I am sorry to hear that! No, this will need to be Rx'ed by her PCP.

## 2014-09-20 ENCOUNTER — Encounter: Payer: Self-pay | Admitting: Internal Medicine

## 2014-09-20 ENCOUNTER — Ambulatory Visit (INDEPENDENT_AMBULATORY_CARE_PROVIDER_SITE_OTHER): Payer: 59 | Admitting: Internal Medicine

## 2014-09-20 VITALS — BP 118/60 | HR 92 | Temp 98.5°F | Resp 12 | Wt 161.0 lb

## 2014-09-20 DIAGNOSIS — E1310 Other specified diabetes mellitus with ketoacidosis without coma: Secondary | ICD-10-CM

## 2014-09-20 DIAGNOSIS — E1110 Type 2 diabetes mellitus with ketoacidosis without coma: Secondary | ICD-10-CM

## 2014-09-20 MED ORDER — GLIPIZIDE ER 5 MG PO TB24: 5.0000 mg | ORAL_TABLET | Freq: Every day | ORAL | Status: DC

## 2014-09-20 NOTE — Progress Notes (Signed)
Patient ID: [name], female   DOB: 09-12-1946, 68 y.o.   MRN: [id number]  HPI: [name] is a 68 y.o.-year-old female, returning for f/u for DM2, dx 2009, non-insulin-dependent, uncontrolled, without complications. Last visit 2.5 mo ago.  She was hospitalized on 09/11/2014 for alcohol induced acute pancreatitis. . Last hemoglobin A1c was: Lab Results  Component Value Date   HGBA1C 7.1* 07/14/2014   HGBA1C 11.0* 04/03/2014   HGBA1C 7.1* 05/29/2012  Before the A1c returned at 11%, she was off all medicines. She was seen regularly at [facility], but missed her appt before b/c drinking.  Pt is on a regimen of: - Januvia 25 >> 50 mg >> 100 mg daily in am. She continues this, but tells me she did not take this before the pancreatitis episode. Tried insulin. Tried Byetta >> had low CBGs (" I bottomed out" - sugars 70).  She tried Januvia 100 mg. She tried Metformin >> nausea and diarrhea (has colitis).  Pt checks her sugars once a day in am and they are higher ([place]): - am: 110-120 >> 131- 161 - 2h after b'fast: 145-174 - before lunch: n/c - 2h after lunch: n/c - before dinner: n/c - 2h after dinner: 201 >> n/c - bedtime: n/c - nighttime: n/c No lows. Lowest sugar was 120; she has hypoglycemia awareness at 100.  Highest sugar was 180 (after icecream the night before)  Pt's meals are: - Breakfast: cereal + banana + 2%milk; bagel with cream cheese - splits this with her parrot - Lunch:might skip; leftovers - baked potato + steak - Dinner: meat + starch + salad - Snacks: no  She drinks diet ginger ale ("all day")  - no CKD, last BUN/creatinine:  Lab Results  Component Value Date   BUN 12 09/16/2014   CREATININE 0.81 09/16/2014  On Lisinopril. - last set of lipids: Lab Results  Component Value Date   CHOL 202* 04/18/2014   HDL 50 04/18/2014   LDLCALC 135* 04/18/2014   LDLDIRECT 186.5 11/17/2006   TRIG 85 04/18/2014   CHOLHDL 4.0 04/18/2014  On Zetia. 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  - Last eye exam was 1 year ago. No DR.  - + numbness and tingling in her L foot, but from L leg fx  I reviewed pt's medications, allergies, PMH, social hx, family hx and no changes required, except as mentioned above.  ROS: Constitutional: no weight gain/loss, + fatigue, no subjective hyperthermia/hypothermia, no poor sleep, + excessive urination Eyes: no blurry vision, no xerophthalmia ENT: no sore throat, no nodules palpated in throat, no dysphagia/odynophagia, no hoarseness; + decreased hearing Cardiovascular: no CP/SOB/palpitations/leg swelling Respiratory: no cough/SOB Gastrointestinal: + N/V/+ D/no C Musculoskeletal: + muscle aches/no joint aches Skin: no rashes Neurological: no tremors/numbness/tingling/dizziness  PE: BP 118/60  Pulse 92  Temp(Src) 98.5 F (36.9 C) (Oral)  Resp 12  Wt 161 lb (73.029 kg)  SpO2 96% Wt Readings from Last 3 Encounters:  09/20/14 161 lb (73.029 kg)  09/16/14 165 lb (74.844 kg)  09/07/14 160 lb (72.576 kg)   Constitutional: overweight, in NAD Eyes: PERRLA, EOMI, no exophthalmos ENT: moist mucous membranes, no thyromegaly, no cervical lymphadenopathy Cardiovascular: RRR, No MRG Respiratory: CTA B Gastrointestinal: abdomen soft, NT, ND, BS+ Musculoskeletal: no deformities, strength intact in all 4 Skin: moist, warm, no rashes Neurological: + tremors with outstretched hands, DTR normal in all 4  ASSESSMENT: 1. DM2, non-insulin-dependent, uncontrolled, without complications  PLAN:  1. Patient with long-standing, recently more controlled  diabetes, on Januvia, with good DM control. We will, unfortunately, need to stop Januvia 2/2 her recent pancreatitis episode. This was considered to be alcohol-induced. - We discussed about options for treatment, and I suggested to:  Patient Instructions  Please stop Januvia. Start Glipizide XL 5 mg daily in am Please return in 1.5 month with your sugar log.  Check sugars 1 a day and write them  down. Please rotate checks throughout the day. - Strongly advised her to start checking sugars at different times of the day - advised for yearly eye exams -  She needs to schedule one - Return to clinic in 1.5 mo with sugar log

## 2014-09-20 NOTE — Patient Instructions (Signed)
Please stop Januvia. Start Glipizide XL 5 mg daily in am Please return in 1.5 month with your sugar log.  Check sugars 1 a day and write them down. Please rotate checks throughout the day.

## 2014-09-26 ENCOUNTER — Telehealth: Payer: Self-pay | Admitting: Internal Medicine

## 2014-09-26 NOTE — Telephone Encounter (Signed)
Please read note below and advise.  

## 2014-09-26 NOTE — Telephone Encounter (Signed)
OK to increase to 10 mg (2 tabs) in am.

## 2014-09-26 NOTE — Telephone Encounter (Signed)
Called pt and lvm advising her per Dr Gherghe's note.  

## 2014-09-26 NOTE — Telephone Encounter (Signed)
Patient wanting to increase dosage glipizide er 5 mg; the dosage now is not effective    Please advise    Thank You

## 2014-09-28 ENCOUNTER — Other Ambulatory Visit: Payer: Self-pay | Admitting: Internal Medicine

## 2014-10-03 ENCOUNTER — Other Ambulatory Visit: Payer: Self-pay | Admitting: Internal Medicine

## 2014-10-07 ENCOUNTER — Telehealth: Payer: Self-pay | Admitting: Internal Medicine

## 2014-10-07 ENCOUNTER — Other Ambulatory Visit: Payer: Self-pay | Admitting: *Deleted

## 2014-10-07 MED ORDER — GLIPIZIDE ER 5 MG PO TB24: 10.0000 mg | ORAL_TABLET | Freq: Every day | ORAL | Status: DC

## 2014-10-07 NOTE — Telephone Encounter (Signed)
Patient need refill of Glipizide for 10 mg  instead of  5 mg. Please advise

## 2014-10-07 NOTE — Telephone Encounter (Signed)
Done

## 2014-10-17 ENCOUNTER — Encounter: Payer: Self-pay | Admitting: Internal Medicine

## 2014-10-17 LAB — HM DIABETES EYE EXAM

## 2014-10-21 ENCOUNTER — Ambulatory Visit: Payer: Self-pay | Admitting: Internal Medicine

## 2014-11-01 ENCOUNTER — Ambulatory Visit (INDEPENDENT_AMBULATORY_CARE_PROVIDER_SITE_OTHER): Payer: 59 | Admitting: Internal Medicine

## 2014-11-01 ENCOUNTER — Encounter: Payer: Self-pay | Admitting: Internal Medicine

## 2014-11-01 ENCOUNTER — Other Ambulatory Visit: Payer: Self-pay | Admitting: *Deleted

## 2014-11-01 VITALS — BP 122/60 | HR 89 | Temp 98.3°F | Resp 12 | Wt 154.0 lb

## 2014-11-01 DIAGNOSIS — E1165 Type 2 diabetes mellitus with hyperglycemia: Secondary | ICD-10-CM

## 2014-11-01 DIAGNOSIS — IMO0002 Reserved for concepts with insufficient information to code with codable children: Secondary | ICD-10-CM

## 2014-11-01 DIAGNOSIS — Z23 Encounter for immunization: Secondary | ICD-10-CM

## 2014-11-01 DIAGNOSIS — E1139 Type 2 diabetes mellitus with other diabetic ophthalmic complication: Secondary | ICD-10-CM

## 2014-11-01 LAB — HEMOGLOBIN A1C: Hgb A1c MFr Bld: 7 % — ABNORMAL HIGH (ref 4.6–6.5)

## 2014-11-01 MED ORDER — GLIPIZIDE ER 5 MG PO TB24: 5.0000 mg | ORAL_TABLET | Freq: Every day | ORAL | Status: DC

## 2014-11-01 NOTE — Progress Notes (Signed)
Patient ID: [name], female   DOB: 1946/07/05, 68 y.o.   MRN: [id number]  HPI: [name] is a 68 y.o.-year-old female, returning for f/u for DM2, dx 2009, non-insulin-dependent, uncontrolled, with complications (+ DR). Last visit 1.5 mo ago.  Last hemoglobin A1c was: Lab Results  Component Value Date   HGBA1C 7.1* 07/14/2014   HGBA1C 11.0* 04/03/2014   HGBA1C 7.1* 05/29/2012  Before the A1c returned at 11%, she was off all medicines. She was seen regularly at [facility], but missed her appt before b/c drinking.  Pt is on a regimen of: - Glipizide XL 5 mg daily in am - started 09/2014 She was hospitalized on 09/11/2014 for alcohol induced acute pancreatitis >> we stopped Januvia Tried insulin. Tried Byetta >> had low CBGs (" I bottomed out" - sugars 70).  She tried Januvia 100 mg. She tried Metformin >> nausea and diarrhea (has colitis).  Pt checks her sugars once a day in am and they are much improved: - am: 110-120 >> 131- 161 >> 95-146 - 2h after b'fast: 145-174 >> n/c - before lunch: n/c - 2h after lunch: n/c >> 83, 97 - before dinner: n/c >> 81-97 - 2h after dinner: 201 >> n/c >> 97-174 - bedtime: n/c - nighttime: n/c No lows. Lowest sugar was 120; she has hypoglycemia awareness at 100.  Highest sugar was 180 (after icecream the night before)  She started the Nutrisystem diet 3 weeks ago >> lost 10 lbs in last 1.5 mo! Before she started the diet, Pt's meals were: - Breakfast: cereal + banana + 2%milk; bagel with cream cheese - splits this with her parrot - Lunch:might skip; leftovers - baked potato + steak - Dinner: meat + starch + salad - Snacks: no  She drinks diet ginger ale ("all day")  - no CKD, last BUN/creatinine:  Lab Results  Component Value Date   BUN 12 09/16/2014   CREATININE 0.81 09/16/2014  On Lisinopril. - last set of lipids: Lab Results  Component Value Date   CHOL 202* 04/18/2014   HDL 50 04/18/2014   LDLCALC 135* 04/18/2014   LDLDIRECT  186.5 11/17/2006   TRIG 85 04/18/2014   CHOLHDL 4.0 04/18/2014  On Zetia. 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  - Last eye exam was 09/23/2014. She saw her optometrist >> cataracts, + DR. - + numbness and tingling in her L foot, but from L leg fx  I reviewed pt's medications, allergies, PMH, social hx, family hx and no changes required, except as mentioned above.  ROS: Constitutional: no weight gain/loss, no fatigue, no subjective hyperthermia/hypothermia, no poor sleep Eyes: no blurry vision, no xerophthalmia ENT: + sore throat, no nodules palpated in throat, + occasional dysphagia/no odynophagia, + hoarseness; + decreased hearing Cardiovascular: no CP/SOB/palpitations/+ intermittent leg swelling Respiratory: no cough/SOB Gastrointestinal: no N/V/D/C Musculoskeletal: no muscle aches/no joint aches Skin: no rashes Neurological: no tremors/numbness/tingling/dizziness  PE: BP 122/60 mmHg  Pulse 89  Temp(Src) 98.3 F (36.8 C) (Oral)  Resp 12  Wt 154 lb (69.854 kg)  SpO2 94% Body mass index is 27.29 kg/(m^2). Wt Readings from Last 3 Encounters:  11/01/14 154 lb (69.854 kg)  09/20/14 161 lb (73.029 kg)  09/16/14 165 lb (74.844 kg)   Constitutional: overweight, in NAD Eyes: PERRLA, EOMI, no exophthalmos ENT: moist mucous membranes, no thyromegaly, no cervical lymphadenopathy Cardiovascular: RRR, No MRG Respiratory: CTA B Gastrointestinal: abdomen soft, NT, ND, BS+ Musculoskeletal: no deformities, strength intact in all 4 Skin: moist, warm, no rashes  Neurological: no tremors with outstretched hands, DTR normal in all 4  ASSESSMENT: 1. DM2, non-insulin-dependent, uncontrolled, without complications  PLAN:  1. Patient with long-standing, recently more controlled diabetes, on Glipizide now, with excellent DM control, especially after starting the Nutrisystem diet. We had to stop Januvia 2/2 her recent pancreatitis episode. This was considered to be alcohol-induced. - we discussed  that, while on the diet, her sugars are a little low and I suggested that she may come off Glipizide but she would like to continue but be very vigilant about not having low CBGs. I suggested to:  Patient Instructions  Please stop at the lab. Please continue Glipizide XL 5 mg daily. Please let me know if the sugars are consistently <80 or >200. Please come back for a follow-up appointment in 3 months with your sugar log.  - Strongly advised her to start checking sugars at different times of the day - advised for yearly eye exams -  She is up to date - will give the flu vaccine today - will check a HbA1c today - Return to clinic in 3 mo with sugar log   Office Visit on 11/01/2014  Component Date Value Ref Range Status  . Hgb A1c MFr Bld 11/01/2014 7.0* 4.6 - 6.5 % Final   Glycemic Control Guidelines for People with Diabetes:Non Diabetic:  <6%Goal of Therapy: <7%Additional Action Suggested:  >8%    Great hemoglobin A1c!

## 2014-11-01 NOTE — Patient Instructions (Signed)
Please stop at the lab. Please continue Glipizide XL 5 mg daily. Please let me know if the sugars are consistently <80 or >200. Please come back for a follow-up appointment in 3 months with your sugar log.

## 2014-11-13 ENCOUNTER — Telehealth: Payer: Self-pay | Admitting: Internal Medicine

## 2014-11-13 NOTE — Telephone Encounter (Signed)
[name] with [facility] calling regarding pt's diabetic supplies they need the icd 10 codes # [phone] or [phone]

## 2014-11-13 NOTE — Telephone Encounter (Signed)
Called Tameka back with [facility] and gave her the ICD 10 code: E11.39.

## 2014-11-27 ENCOUNTER — Emergency Department (HOSPITAL_COMMUNITY)
Admission: EM | Admit: 2014-11-27 | Discharge: 2014-11-27 | Disposition: A | Discharge status: Home / Self Care | Payer: 59 | Attending: Emergency Medicine | Admitting: Emergency Medicine

## 2014-11-27 ENCOUNTER — Encounter (HOSPITAL_COMMUNITY): Payer: Self-pay | Admitting: Family Medicine

## 2014-11-27 DIAGNOSIS — Y9289 Other specified places as the place of occurrence of the external cause: Secondary | ICD-10-CM | POA: Diagnosis not present

## 2014-11-27 DIAGNOSIS — F1010 Alcohol abuse, uncomplicated: Secondary | ICD-10-CM | POA: Diagnosis not present

## 2014-11-27 DIAGNOSIS — Y998 Other external cause status: Secondary | ICD-10-CM | POA: Insufficient documentation

## 2014-11-27 DIAGNOSIS — T7421XA Adult sexual abuse, confirmed, initial encounter: Secondary | ICD-10-CM

## 2014-11-27 DIAGNOSIS — Z87891 Personal history of nicotine dependence: Secondary | ICD-10-CM | POA: Insufficient documentation

## 2014-11-27 DIAGNOSIS — Z8739 Personal history of other diseases of the musculoskeletal system and connective tissue: Secondary | ICD-10-CM | POA: Diagnosis not present

## 2014-11-27 DIAGNOSIS — Z79899 Other long term (current) drug therapy: Secondary | ICD-10-CM | POA: Insufficient documentation

## 2014-11-27 DIAGNOSIS — Y9389 Activity, other specified: Secondary | ICD-10-CM | POA: Diagnosis not present

## 2014-11-27 DIAGNOSIS — F329 Major depressive disorder, single episode, unspecified: Secondary | ICD-10-CM | POA: Insufficient documentation

## 2014-11-27 DIAGNOSIS — Z8719 Personal history of other diseases of the digestive system: Secondary | ICD-10-CM | POA: Insufficient documentation

## 2014-11-27 DIAGNOSIS — I10 Essential (primary) hypertension: Secondary | ICD-10-CM | POA: Insufficient documentation

## 2014-11-27 DIAGNOSIS — Z91040 Latex allergy status: Secondary | ICD-10-CM | POA: Diagnosis not present

## 2014-11-27 DIAGNOSIS — E119 Type 2 diabetes mellitus without complications: Secondary | ICD-10-CM | POA: Insufficient documentation

## 2014-11-27 MED ORDER — LORAZEPAM 1 MG PO TABS
2.0000 mg | ORAL_TABLET | Freq: Once | ORAL | Status: AC
  Administered 2014-11-27: 2 mg via ORAL
  Filled 2014-11-27: qty 2

## 2014-11-27 MED ORDER — ULIPRISTAL ACETATE 30 MG PO TABS
ORAL_TABLET | ORAL | Status: AC
  Administered 2014-11-27: 30 mg
  Filled 2014-11-27: qty 1

## 2014-11-27 MED ORDER — LORAZEPAM 1 MG PO TABS: 1.0000 mg | ORAL_TABLET | Freq: Once | ORAL | Status: DC

## 2014-11-27 MED ORDER — PROMETHAZINE HCL 25 MG PO TABS
ORAL_TABLET | ORAL | Status: AC
Start: 2014-11-27 — End: 2014-11-27
  Administered 2014-11-27: 75 mg
  Filled 2014-11-27: qty 3

## 2014-11-27 MED ORDER — AZITHROMYCIN 1 G PO PACK
PACK | ORAL | Status: AC
Start: 2014-11-27 — End: 2014-11-27
  Administered 2014-11-27: 1 g
  Filled 2014-11-27: qty 1

## 2014-11-27 MED ORDER — CEFIXIME 400 MG PO TABS
ORAL_TABLET | ORAL | Status: AC
  Administered 2014-11-27: 400 mg
  Filled 2014-11-27: qty 1

## 2014-11-27 MED ORDER — METRONIDAZOLE 500 MG PO TABS
ORAL_TABLET | ORAL | Status: AC
Start: 2014-11-27 — End: 2014-11-27
  Administered 2014-11-27: 2000 mg
  Filled 2014-11-27: qty 4

## 2014-11-27 NOTE — ED Notes (Signed)
Sane nurse contacted

## 2014-11-27 NOTE — ED Notes (Signed)
SANE nurse at bedside.

## 2014-11-27 NOTE — Discharge Instructions (Signed)
Return to the emergency room with worsening of symptoms, new symptoms or with symptoms that are concerning. You may return to the emergency room within 72 hours for any evidence collection.   Alcohol Use Disorder Alcohol use disorder is a mental disorder. It is not a one-time incident of heavy drinking. Alcohol use disorder is the excessive and uncontrollable use of alcohol over time that leads to problems with functioning in one or more areas of daily living. People with this disorder risk harming themselves and others when they drink to excess. Alcohol use disorder also can cause other mental disorders, such as mood and anxiety disorders, and serious physical problems. People with alcohol use disorder often misuse other drugs.  Alcohol use disorder is common and widespread. Some people with this disorder drink alcohol to cope with or escape from negative life events. Others drink to relieve chronic pain or symptoms of mental illness. People with a family history of alcohol use disorder are at higher risk of losing control and using alcohol to excess.  SYMPTOMS  Signs and symptoms of alcohol use disorder may include the following:   Consumption ofalcohol inlarger amounts or over a longer period of time than intended.  Multiple unsuccessful attempts to cutdown or control alcohol use.   A great deal of time spent obtaining alcohol, using alcohol, or recovering from the effects of alcohol (hangover).  A strong desire or urge to use alcohol (cravings).   Continued use of alcohol despite problems at work, school, or home because of alcohol use.   Continued use of alcohol despite problems in relationships because of alcohol use.  Continued use of alcohol in situations when it is physically hazardous, such as driving a car.  Continued use of alcohol despite awareness of a physical or psychological problem that is likely related to alcohol use. Physical problems related to alcohol use can  involve the brain, heart, liver, stomach, and intestines. Psychological problems related to alcohol use include intoxication, depression, anxiety, psychosis, delirium, and dementia.   The need for increased amounts of alcohol to achieve the same desired effect, or a decreased effect from the consumption of the same amount of alcohol (tolerance).  Withdrawal symptoms upon reducing or stopping alcohol use, or alcohol use to reduce or avoid withdrawal symptoms. Withdrawal symptoms include:  Racing heart.  Hand tremor.  Difficulty sleeping.  Nausea.  Vomiting.  Hallucinations.  Restlessness.  Seizures. DIAGNOSIS Alcohol use disorder is diagnosed through an assessment by your health care provider. Your health care provider may start by asking three or four questions to screen for excessive or problematic alcohol use. To confirm a diagnosis of alcohol use disorder, at least two symptoms must be present within a 59-month period. The severity of alcohol use disorder depends on the number of symptoms:  Mild--two or three.  Moderate--four or five.  Severe--six or more. Your health care provider may perform a physical exam or use results from lab tests to see if you have physical problems resulting from alcohol use. Your health care provider may refer you to a mental health professional for evaluation. TREATMENT  Some people with alcohol use disorder are able to reduce their alcohol use to low-risk levels. Some people with alcohol use disorder need to quit drinking alcohol. When necessary, mental health professionals with specialized training in substance use treatment can help. Your health care provider can help you decide how severe your alcohol use disorder is and what type of treatment you need. The following forms of treatment  are available:   Detoxification. Detoxification involves the use of prescription medicines to prevent alcohol withdrawal symptoms in the first week after quitting.  This is important for people with a history of symptoms of withdrawal and for heavy drinkers who are likely to have withdrawal symptoms. Alcohol withdrawal can be dangerous and, in severe cases, cause death. Detoxification is usually provided in a hospital or in-patient substance use treatment facility.  Counseling or talk therapy. Talk therapy is provided by substance use treatment counselors. It addresses the reasons people use alcohol and ways to keep them from drinking again. The goals of talk therapy are to help people with alcohol use disorder find healthy activities and ways to cope with life stress, to identify and avoid triggers for alcohol use, and to handle cravings, which can cause relapse.  Medicines.Different medicines can help treat alcohol use disorder through the following actions:  Decrease alcohol cravings.  Decrease the positive reward response felt from alcohol use.  Produce an uncomfortable physical reaction when alcohol is used (aversion therapy).  Support groups. Support groups are run by people who have quit drinking. They provide emotional support, advice, and guidance. These forms of treatment are often combined. Some people with alcohol use disorder benefit from intensive combination treatment provided by specialized substance use treatment centers. Both inpatient and outpatient treatment programs are available. Document Released: 01/06/2005 Document Revised: 04/15/2014 Document Reviewed: 03/08/2013 [facility] Patient Information 2015 [place], [place]. This information is not intended to replace advice given to you by your health care provider. Make sure you discuss any questions you have with your health care provider.  Sexual Assault or Rape Sexual assault is any sexual activity that a person is forced, threatened, or coerced into participating in. It may or may not involve physical contact. You are being sexually abused if you are forced to have sexual contact of any  kind. Sexual assault is called rape if penetration has occurred (vaginal, oral, or anal). Many times, sexual assaults are committed by a friend, relative, or associate. Sexual assault and rape are never the victim's fault.  Sexual assault can result in various health problems for the person who was assaulted. Some of these problems include:  Physical injuries in the genital area or other areas of the body.  Risk of unwanted pregnancy.  Risk of sexually transmitted infections (STIs).  Psychological problems such as anxiety, depression, or posttraumatic stress disorder. WHAT STEPS SHOULD BE TAKEN AFTER A SEXUAL ASSAULT? If you have been sexually assaulted, you should take the following steps as soon as possible:  Go to a safe area as quickly as possible and call your local emergency services (911 in [place] Get away from the area where you have been attacked.   Do not wash, shower, comb your hair, or clean any part of your body.   Do not change your clothes.   Do not remove or touch anything in the area where you were assaulted.   Go to an emergency room for a complete physical exam. Get the necessary tests to protect yourself from STIs or pregnancy. You may be treated for an STI even if no signs of one are present. Emergency contraceptive medicines are also available to help prevent pregnancy, if this is desired. You may need to be examined by a specially trained health care provider.  Have the health care provider collect evidence during the exam, even if you are not sure if you will file a report with the police.  Find out how to  file the correct papers with the authorities. This is important for all assaults, even if they were committed by a family member or friend.  Find out where you can get additional help and support, such as a local rape crisis center.  Follow up with your health care provider as directed.  HOW CAN YOU REDUCE THE CHANCES OF SEXUAL ASSAULT? Take the following  steps to help reduce your chances of being sexually assaulted:  Consider carrying mace or pepper spray for protection against an attacker.   Consider taking a self-defense course.  Do not try to fight off an attacker if he or she has a gun or knife.   Be aware of your surroundings, what is happening around you, and who might be there.   Be assertive, trust your instincts, and walk with confidence and direction.  Be careful not to drink too much alcohol or use other intoxicants. These can reduce your ability to fight off an assault.  Always lock your doors and windows. Be sure to have high-quality locks for your home.   Do not let people enter your house if you do not know them.   Get a home security system that has a siren if you are able.   Protect the keys to your house and car. Do not lend them out. Do not put your name and address on them. If you lose them, get your locks changed.   Always lock your car and have your key ready to open the door before approaching the car.   Park in a well-lit and busy area.  Plan your driving routes so that you travel on well-lit and frequently used streets.  Keep your car serviced. Always have at least half a tank of gas in it.   Do not go into isolated areas alone. This includes open garages, empty buildings or offices, or [employer].   Do not walk or jog alone, especially when it is dark.   Never hitchhike.   If your car breaks down, call the police for help on your cell phone and stay inside the car with your doors locked and windows up.   If you are being followed, go to a busy area and call for help.   If you are stopped by a police officer, especially one in an unmarked police car, keep your door locked. Do not put your window down all the way. Ask the officer to show you identification first.   Be aware of "date rape drugs" that can be placed in a drink when you are not looking. These drugs can make you  unable to fight off an assault. FOR MORE INFORMATION  Office on [employer], [place] Department of Health and Human Services: [employer]pl  National Sexual Assault Hotline: 1-800-656-HOPE 470-117-2567)  [place]: 1-800-799-SAFE 910 002 6208) or www.thehotline.org Document Released: 11/26/2000 Document Revised: 08/01/2013 Document Reviewed: 05/02/2013 [facility] Patient Information 2015 [place], [place]. This information is not intended to replace advice given to you by your health care provider. Make sure you discuss any questions you have with your health care provider.
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
## 2014-12-30 ENCOUNTER — Encounter (HOSPITAL_COMMUNITY): Payer: Self-pay | Admitting: Emergency Medicine

## 2014-12-30 ENCOUNTER — Emergency Department (HOSPITAL_COMMUNITY)
Admission: EM | Admit: 2014-12-30 | Discharge: 2014-12-30 | Discharge status: Against Medical Advice | Payer: 59 | Attending: Emergency Medicine | Admitting: Emergency Medicine

## 2014-12-30 DIAGNOSIS — Z8719 Personal history of other diseases of the digestive system: Secondary | ICD-10-CM | POA: Insufficient documentation

## 2014-12-30 DIAGNOSIS — Z79899 Other long term (current) drug therapy: Secondary | ICD-10-CM | POA: Diagnosis not present

## 2014-12-30 DIAGNOSIS — F1010 Alcohol abuse, uncomplicated: Secondary | ICD-10-CM

## 2014-12-30 DIAGNOSIS — Z91040 Latex allergy status: Secondary | ICD-10-CM | POA: Insufficient documentation

## 2014-12-30 DIAGNOSIS — E119 Type 2 diabetes mellitus without complications: Secondary | ICD-10-CM | POA: Diagnosis not present

## 2014-12-30 DIAGNOSIS — N390 Urinary tract infection, site not specified: Secondary | ICD-10-CM | POA: Diagnosis not present

## 2014-12-30 DIAGNOSIS — I10 Essential (primary) hypertension: Secondary | ICD-10-CM | POA: Insufficient documentation

## 2014-12-30 DIAGNOSIS — F329 Major depressive disorder, single episode, unspecified: Secondary | ICD-10-CM | POA: Diagnosis not present

## 2014-12-30 DIAGNOSIS — Z87891 Personal history of nicotine dependence: Secondary | ICD-10-CM | POA: Diagnosis not present

## 2014-12-30 DIAGNOSIS — M797 Fibromyalgia: Secondary | ICD-10-CM | POA: Insufficient documentation

## 2014-12-30 LAB — CBC WITH DIFFERENTIAL/PLATELET
Basophils Absolute: 0 10*3/uL (ref 0.0–0.1)
Basophils Relative: 1 % (ref 0–1)
Eosinophils Absolute: 0 10*3/uL (ref 0.0–0.7)
Eosinophils Relative: 0 % (ref 0–5)
HCT: 41 % (ref 36.0–46.0)
Hemoglobin: 14.1 g/dL (ref 12.0–15.0)
Lymphocytes Relative: 55 % — ABNORMAL HIGH (ref 12–46)
Lymphs Abs: 2.7 10*3/uL (ref 0.7–4.0)
MCH: 31.5 pg (ref 26.0–34.0)
MCHC: 34.4 g/dL (ref 30.0–36.0)
MCV: 91.7 fL (ref 78.0–100.0)
Monocytes Absolute: 0.3 10*3/uL (ref 0.1–1.0)
Monocytes Relative: 7 % (ref 3–12)
Neutro Abs: 1.8 10*3/uL (ref 1.7–7.7)
Neutrophils Relative %: 37 % — ABNORMAL LOW (ref 43–77)
Platelets: 149 10*3/uL — ABNORMAL LOW (ref 150–400)
RBC: 4.47 MIL/uL (ref 3.87–5.11)
RDW: 15.4 % (ref 11.5–15.5)
WBC: 4.9 10*3/uL (ref 4.0–10.5)

## 2014-12-30 LAB — COMPREHENSIVE METABOLIC PANEL
ALT: 21 U/L (ref 0–35)
AST: 44 U/L — ABNORMAL HIGH (ref 0–37)
Albumin: 4.2 g/dL (ref 3.5–5.2)
Alkaline Phosphatase: 85 U/L (ref 39–117)
Anion gap: 17 — ABNORMAL HIGH (ref 5–15)
BUN: 15 mg/dL (ref 6–23)
CO2: 21 mmol/L (ref 19–32)
Calcium: 8.4 mg/dL (ref 8.4–10.5)
Chloride: 101 mEq/L (ref 96–112)
Creatinine, Ser: 0.5 mg/dL (ref 0.50–1.10)
GFR calc Af Amer: >90 mL/min (ref >90)
GFR calc non Af Amer: >90 mL/min (ref >90)
Glucose, Bld: 112 mg/dL — ABNORMAL HIGH (ref 70–99)
Potassium: 4.3 mmol/L (ref 3.5–5.1)
Sodium: 139 mmol/L (ref 135–145)
Total Bilirubin: 0.5 mg/dL (ref 0.3–1.2)
Total Protein: 7.8 g/dL (ref 6.0–8.3)

## 2014-12-30 LAB — URINALYSIS, ROUTINE W REFLEX MICROSCOPIC
Bilirubin Urine: NEGATIVE
Glucose, UA: NEGATIVE mg/dL
Ketones, ur: NEGATIVE mg/dL
Nitrite: NEGATIVE
Protein, ur: 100 mg/dL — AB
Specific Gravity, Urine: 1.017 (ref 1.005–1.030)
Urobilinogen, UA: 0.2 mg/dL (ref 0.0–1.0)
pH: 6.5 (ref 5.0–8.0)

## 2014-12-30 LAB — URINE MICROSCOPIC-ADD ON

## 2014-12-30 LAB — SALICYLATE LEVEL: Salicylate Lvl: <4 mg/dL (ref 2.8–20.0)

## 2014-12-30 LAB — RAPID URINE DRUG SCREEN, HOSP PERFORMED
Amphetamines: NOT DETECTED
Barbiturates: NOT DETECTED
Benzodiazepines: NOT DETECTED
Cocaine: NOT DETECTED
Opiates: NOT DETECTED
Tetrahydrocannabinol: NOT DETECTED

## 2014-12-30 LAB — ACETAMINOPHEN LEVEL: Acetaminophen (Tylenol), Serum: <10 ug/mL — ABNORMAL LOW (ref 10–30)

## 2014-12-30 LAB — ETHANOL: Alcohol, Ethyl (B): 392 mg/dL — ABNORMAL HIGH (ref 0–9)

## 2014-12-30 MED ORDER — FOLIC ACID 1 MG PO TABS
1.0000 mg | ORAL_TABLET | Freq: Every day | ORAL | Status: DC
Start: 2014-12-31 — End: 2014-12-31

## 2014-12-30 MED ORDER — IBUPROFEN 400 MG PO TABS: 600.0000 mg | ORAL_TABLET | Freq: Three times a day (TID) | ORAL | Status: DC | PRN

## 2014-12-30 MED ORDER — ZOLPIDEM TARTRATE 5 MG PO TABS: 5.0000 mg | ORAL_TABLET | Freq: Every evening | ORAL | Status: DC | PRN

## 2014-12-30 MED ORDER — ADULT MULTIVITAMIN W/MINERALS CH
1.0000 | ORAL_TABLET | Freq: Every day | ORAL | Status: DC
Start: 2014-12-31 — End: 2014-12-31

## 2014-12-30 MED ORDER — ALUM & MAG HYDROXIDE-SIMETH 200-200-20 MG/5ML PO SUSP: 30.0000 mL | ORAL | Status: DC | PRN

## 2014-12-30 MED ORDER — LORAZEPAM 1 MG PO TABS
1.0000 mg | ORAL_TABLET | Freq: Four times a day (QID) | ORAL | Status: DC | PRN
  Administered 2014-12-30: 1 mg via ORAL
  Filled 2014-12-30: qty 1

## 2014-12-30 MED ORDER — PREGABALIN 75 MG PO CAPS: 200.0000 mg | ORAL_CAPSULE | Freq: Two times a day (BID) | ORAL | Status: DC

## 2014-12-30 MED ORDER — THIAMINE HCL 100 MG/ML IJ SOLN: 100.0000 mg | Freq: Every day | INTRAMUSCULAR | Status: DC

## 2014-12-30 MED ORDER — VITAMIN B-1 100 MG PO TABS: 100.0000 mg | ORAL_TABLET | Freq: Every day | ORAL | Status: DC

## 2014-12-30 MED ORDER — LISINOPRIL 10 MG PO TABS
5.0000 mg | ORAL_TABLET | Freq: Every day | ORAL | Status: DC
  Administered 2014-12-30: 5 mg via ORAL
  Filled 2014-12-30: qty 1

## 2014-12-30 MED ORDER — NEBIVOLOL HCL 5 MG PO TABS: 5.0000 mg | ORAL_TABLET | Freq: Every day | ORAL | Status: DC

## 2014-12-30 MED ORDER — NICOTINE 21 MG/24HR TD PT24
21.0000 mg | MEDICATED_PATCH | Freq: Every day | TRANSDERMAL | Status: DC
Start: 2014-12-30 — End: 2014-12-30

## 2014-12-30 MED ORDER — EZETIMIBE 10 MG PO TABS: 10.0000 mg | ORAL_TABLET | Freq: Every day | ORAL | Status: DC

## 2014-12-30 MED ORDER — ACETAMINOPHEN 325 MG PO TABS: 650.0000 mg | ORAL_TABLET | ORAL | Status: DC | PRN

## 2014-12-30 MED ORDER — LORAZEPAM 2 MG/ML IJ SOLN: 1.0000 mg | Freq: Four times a day (QID) | INTRAMUSCULAR | Status: DC | PRN

## 2014-12-30 MED ORDER — ESCITALOPRAM OXALATE 10 MG PO TABS
20.0000 mg | ORAL_TABLET | Freq: Every day | ORAL | Status: DC
  Administered 2014-12-30: 20 mg via ORAL
  Filled 2014-12-30: qty 2

## 2014-12-30 MED ORDER — GLIPIZIDE ER 5 MG PO TB24
5.0000 mg | ORAL_TABLET | Freq: Every day | ORAL | Status: DC
  Filled 2014-12-30: qty 1

## 2014-12-30 MED ORDER — NITROFURANTOIN MONOHYD MACRO 100 MG PO CAPS: 100.0000 mg | ORAL_CAPSULE | Freq: Two times a day (BID) | ORAL | Status: DC

## 2014-12-30 MED ORDER — LORAZEPAM 1 MG PO TABS
0.0000 mg | ORAL_TABLET | Freq: Two times a day (BID) | ORAL | Status: DC
Start: 2015-01-02 — End: 2014-12-31

## 2014-12-30 MED ORDER — LORAZEPAM 1 MG PO TABS: 0.0000 mg | ORAL_TABLET | Freq: Four times a day (QID) | ORAL | Status: DC

## 2014-12-30 MED ORDER — ONDANSETRON HCL 4 MG PO TABS
4.0000 mg | ORAL_TABLET | Freq: Three times a day (TID) | ORAL | Status: DC | PRN
  Administered 2014-12-30: 4 mg via ORAL
  Filled 2014-12-30: qty 1

## 2014-12-30 NOTE — ED Notes (Signed)
[place], PA at bedside.

## 2014-12-30 NOTE — ED Notes (Signed)
Pt at desk making a phone call.

## 2014-12-30 NOTE — ED Notes (Signed)
This RN explained to pt the risks of leaving AMA. Pt verbally assented, and signed her AMA form. This RN escorted the pt to the lobby to wait for her ride home.

## 2014-12-30 NOTE — ED Notes (Addendum)
Pt would like to leave AMA, however, the pt does not have a ride home. Pt is insisting that we call a cab for her. Pt states that she has uses a cab driver named "Duard Brady", who "drives cab number 25 with [employer]". This RN spoke with [employer] [employer] who states that there is no one named Leisure centre manager working [employer], or driving cab 25. Laisure, PA would like to see the pt's ride home pick the pt up. Pt would like to speak with the charge nurse.

## 2014-12-30 NOTE — ED Notes (Addendum)
The pt had this RN speak with a man named [name], who states that he will come pick the pt up from the hospital. Pt given her belongings, and is getting changed in her room.

## 2014-12-30 NOTE — ED Notes (Signed)
Pt states she has been intoxicated for 2 weeks and has been drinking 2 bottles of wine a day. Pt wants help to detox. Pt drank two glasses of wine this am. Denies and SI or HI thought at this time.

## 2014-12-30 NOTE — ED Notes (Signed)
Randall Hiss, Agricultural consultant at bedside.

## 2014-12-30 NOTE — ED Notes (Signed)
Lab at bedside
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
## 2014-12-30 NOTE — ED Notes (Signed)
Pt called out stating that she wants to sign herself out, because she's "not getting better here". Laisure, Morgan notified and states that she will see the pt.

## 2014-12-30 NOTE — ED Notes (Addendum)
Pt called out stating "I'm I'm getting jittery, and crazy and shaky". This RN to give ativan for anxiety.

## 2014-12-30 NOTE — ED Provider Notes (Signed)
CSN: [id number]     Arrival date & time 12/30/14  1511 History   First MD Initiated Contact with Patient 12/30/14 1647     Chief Complaint  Patient presents with  . Addiction Problem     (Consider location/radiation/quality/duration/timing/severity/associated sxs/prior Treatment) HPI Comments: Patient presents today requesting Alcohol detox.  She reports that she "relapsed" two week ago and has been drinking alcohol daily over the past 2 weeks.  She reports drinking a "giant" bottle of wine daily.  Last drink was around 11 AM today.  She states that she has gone through alcohol detox in the past.  She denies history of DT's or Seizure when withdrawing from alcohol. She denies SI or HI.  She denies any physical symptoms at this time.  The history is provided by the patient.    Past Medical History  Diagnosis Date  . Alcohol abuse   . Diabetes mellitus   . Hypertension   . Fibromyalgia   . Depression   . Carotid artery stenosis   . Diverticulitis   . Hepatitis 08/28/2014   Past Surgical History  Procedure Laterality Date  . Cesarean section    . Tonsillectomy    . Bunionectomy     Family History  Problem Relation Age of Onset  . Cancer Mother   . Heart disease Father   . Heart disease Sister    History  Substance Use Topics  . Smoking status: Former Smoker    Quit date: 03/11/2010  . Smokeless tobacco: Never Used  . Alcohol Use: Yes     Comment: 4-5 bottles of wine per day / 2-3 quarts per day   OB History    No data available     Review of Systems  All other systems reviewed and are negative.     Allergies  Latex; Lipitor; Codeine; Pentazocine lactate; and Sulfonamide derivatives  Home Medications   Prior to Admission medications   Medication Sig Start Date End Date Taking? Authorizing Provider  b complex vitamins tablet Take 1 tablet by mouth daily.   Yes Historical Provider, MD  diphenoxylate-atropine (LOMOTIL) 2.5-0.025 MG per tablet Take 1 tablet by  mouth 4 (four) times daily as needed for diarrhea or loose stools. 07/19/14  Yes Deepak Advani, MD  escitalopram (LEXAPRO) 20 MG tablet Take 20 mg by mouth daily.   Yes Historical Provider, MD  ezetimibe (ZETIA) 10 MG tablet Take 1 tablet (10 mg total) by mouth daily. 07/09/14  Yes Philemon Kingdom, MD  glipiZIDE (GLUCOTROL XL) 5 MG 24 hr tablet Take 1 tablet (5 mg total) by mouth daily with breakfast. 11/01/14  Yes Philemon Kingdom, MD  lisinopril (PRINIVIL,ZESTRIL) 5 MG tablet Take 1 tablet (5 mg total) by mouth daily. 07/09/14  Yes Philemon Kingdom, MD  LORazepam (ATIVAN) 0.5 MG tablet Take 1 tablet (0.5 mg total) by mouth daily as needed for anxiety. 09/16/14  Yes Deepak Advani, MD  LYRICA 200 MG capsule Take 200 mg by mouth 2 (two) times daily.  06/13/14  Yes Historical Provider, MD  nebivolol (BYSTOLIC) 5 MG tablet Take 1 tablet (5 mg total) by mouth daily. 07/09/14  Yes Philemon Kingdom, MD  Omega-3 Fatty Acids (FISH OIL) 1000 MG CAPS Take 1 capsule by mouth 2 (two) times daily.   Yes Historical Provider, MD  nitrofurantoin, macrocrystal-monohydrate, (MACROBID) 100 MG capsule Take 1 capsule (100 mg total) by mouth every 12 (twelve) hours. Patient not taking: Reported on 11/27/2014 09/05/14   Caren Griffins, MD  ondansetron [facility])  8 MG tablet Take 1 tablet (8 mg total) by mouth every 8 (eight) hours as needed for nausea or vomiting. Patient not taking: Reported on 11/27/2014 09/16/14   [name], MD  oxyCODONE 10 MG TABS Take 1 tablet (10 mg total) by mouth every 6 (six) hours as needed for moderate pain. Patient not taking: Reported on 12/30/2014 09/05/14   Caren Griffins, MD  Oxycodone HCl 10 MG TABS Take 1 tablet (10 mg total) by mouth 4 (four) times daily. Patient not taking: Reported on 11/27/2014 09/07/14   Hyman Bible, PA-C  oxyCODONE-acetaminophen (PERCOCET) 5-325 MG per tablet 1 to 2 tablets every 6 hours as needed for pain. Patient not taking: Reported on 11/27/2014 09/11/14   Harden Mo, MD  promethazine (PHENERGAN) 12.5 MG tablet Take 1 tablet (12.5 mg total) by mouth every 6 (six) hours as needed for nausea or vomiting. Patient not taking: Reported on 11/27/2014 09/05/14   Caren Griffins, MD  promethazine (PHENERGAN) 25 MG tablet Take 1 tablet (25 mg total) by mouth every 6 (six) hours as needed for nausea or vomiting. Patient not taking: Reported on 11/27/2014 09/11/14   Harden Mo, MD  traMADol (ULTRAM) 50 MG tablet Take 1 tablet (50 mg total) by mouth every 8 (eight) hours as needed for moderate pain. Patient not taking: Reported on 12/30/2014 09/16/14   [name], MD   BP 144/65 mmHg  Pulse 97  Temp(Src) 98.2 F (36.8 C) (Oral)  Resp 20  Ht 5\' 3"  (1.6 m)  Wt 140 lb (63.504 kg)  BMI 24.81 kg/m2  SpO2 95% Physical Exam  Constitutional: She appears well-developed and well-nourished.  HENT:  Head: Normocephalic and atraumatic.  Mouth/Throat: Oropharynx is clear and moist.  Neck: Normal range of motion. Neck supple.  Cardiovascular: Normal rate, regular rhythm and normal heart sounds.   Pulmonary/Chest: Effort normal and breath sounds normal.  Abdominal: Soft. Bowel sounds are normal.  Musculoskeletal: Normal range of motion.  Neurological: She is alert.  Patient intoxicated  Skin: Skin is warm and dry.  Psychiatric: She has a normal mood and affect. She expresses no homicidal and no suicidal ideation. She expresses no suicidal plans and no homicidal plans.  Nursing note and vitals reviewed.   ED Course  Procedures (including critical care time) Labs Review Labs Reviewed  COMPREHENSIVE METABOLIC PANEL - Abnormal; Notable for the following:    Glucose, Bld 112 (*)    AST 44 (*)    Anion gap 17 (*)    All other components within normal limits  ETHANOL - Abnormal; Notable for the following:    Alcohol, Ethyl (B) 392 (*)    All other components within normal limits  CBC WITH DIFFERENTIAL - Abnormal; Notable for the following:    Platelets  149 (*)    Neutrophils Relative % 37 (*)    Lymphocytes Relative 55 (*)    All other components within normal limits  ACETAMINOPHEN LEVEL - Abnormal; Notable for the following:    Acetaminophen (Tylenol), Serum <10.0 (*)    All other components within normal limits  URINALYSIS, ROUTINE W REFLEX MICROSCOPIC - Abnormal; Notable for the following:    APPearance CLOUDY (*)    Hgb urine dipstick TRACE (*)    Protein, ur 100 (*)    Leukocytes, UA LARGE (*)    All other components within normal limits  URINE MICROSCOPIC-ADD ON - Abnormal; Notable for the following:    Bacteria, UA MANY (*)  All other components within normal limits  SALICYLATE LEVEL  URINE RAPID DRUG SCREEN (HOSP PERFORMED)    Imaging Review No results found.   EKG Interpretation None     9:05 PM Patient reports that she no longer wants to stay and would like to be discharged home.  Patient alert and orientated.  Patient coherent.  Patient able to ambulate.  Patient denies SI or HI. MDM   Final diagnoses:  None   Patient presents today requesting alcohol detox.  Patient initially intoxicated.  Patient denies SI or HI.  After several hours in the ED the patient reported that she no longer wanted to stay in the ED.  She was requesting discharge.  Patient had a ride home.  She was coherent and able to ambulate.  Patient discharged home.  Return precautions given.    Hyman Bible, PA-C 12/31/14 0001  Charlesetta Shanks, MD 12/31/14 2229

## 2014-12-30 NOTE — ED Notes (Signed)
Pt husband visiting at bedside

## 2014-12-30 NOTE — ED Notes (Signed)
Pt at desk, making another phone call.

## 2014-12-30 NOTE — ED Notes (Signed)
Patient is visibly intoxicated. She states she is a [employer] and she wants ativan before she does anything. Explained to patient that we do not give ativan to intoxicated patients. That it is unsafe.

## 2015-01-03 LAB — URINE CULTURE: Colony Count: >=100000

## 2015-01-06 ENCOUNTER — Telehealth (HOSPITAL_COMMUNITY): Payer: Self-pay

## 2015-01-06 NOTE — Telephone Encounter (Signed)
Post ED Visit - Positive Culture Follow-up  Culture report reviewed by antimicrobial stewardship pharmacist: []  Wes [place], Pharm.D., BCPS []  Heide Guile, Pharm.D., BCPS []  Alycia Rossetti, Pharm.D., BCPS []  [place], Pharm.D., BCPS, AAHIVP []  Legrand Como, Pharm.D., BCPS, AAHIVP []  Isac Sarna, Pharm.D., BCPS X Sherlon Handing, Pharm D  Positive Urine culture. >/= 100,000 colonies -> Enterococcus species Treated with Nitrofurantoin, organism sensitive to the same and no further patient follow-up is required at this time.  [name] 01/06/2015, 6:25 AM

## 2015-01-31 ENCOUNTER — Other Ambulatory Visit: Payer: Self-pay | Admitting: Internal Medicine

## 2015-02-03 ENCOUNTER — Ambulatory Visit: Payer: Self-pay | Admitting: Internal Medicine

## 2015-02-10 ENCOUNTER — Encounter: Payer: Self-pay | Admitting: Internal Medicine

## 2015-02-10 ENCOUNTER — Other Ambulatory Visit: Payer: Self-pay | Admitting: *Deleted

## 2015-02-10 ENCOUNTER — Ambulatory Visit (INDEPENDENT_AMBULATORY_CARE_PROVIDER_SITE_OTHER): Payer: 59 | Admitting: Internal Medicine

## 2015-02-10 ENCOUNTER — Encounter (INDEPENDENT_AMBULATORY_CARE_PROVIDER_SITE_OTHER): Payer: Self-pay

## 2015-02-10 VITALS — BP 102/62 | HR 85 | Temp 98.2°F | Resp 12 | Wt 156.0 lb

## 2015-02-10 DIAGNOSIS — E1165 Type 2 diabetes mellitus with hyperglycemia: Principal | ICD-10-CM

## 2015-02-10 DIAGNOSIS — E1139 Type 2 diabetes mellitus with other diabetic ophthalmic complication: Secondary | ICD-10-CM

## 2015-02-10 DIAGNOSIS — IMO0002 Reserved for concepts with insufficient information to code with codable children: Secondary | ICD-10-CM

## 2015-02-10 LAB — HEMOGLOBIN A1C: Hgb A1c MFr Bld: 6.8 % — ABNORMAL HIGH (ref 4.6–6.5)

## 2015-02-10 MED ORDER — ACCU-CHEK MULTICLIX LANCETS MISC: Status: AC

## 2015-02-10 MED ORDER — GLIPIZIDE ER 5 MG PO TB24: 5.0000 mg | ORAL_TABLET | Freq: Every day | ORAL | Status: DC

## 2015-02-10 MED ORDER — GLUCOSE BLOOD VI STRP: ORAL_STRIP | Status: AC

## 2015-02-10 NOTE — Progress Notes (Signed)
Patient ID: [name], female   DOB: [birth year], 69 y.o.   MRN: [id number]  HPI: [name] is a 69 y.o.-year-old female, returning for f/u for DM2, dx 2009, non-insulin-dependent, uncontrolled, with complications (+ DR). Last visit 3 mo ago.  Last hemoglobin A1c was: Lab Results  Component Value Date   HGBA1C 7.0* 11/01/2014   HGBA1C 7.1* 07/14/2014   HGBA1C 11.0* 04/03/2014  Before the A1c returned at 11%, she was off all medicines. She was seen regularly at [facility], but missed her appt before b/c drinking.  Pt is on a regimen of: - Glipizide XL 5 mg daily in am - started 09/2014 She was hospitalized on 09/11/2014 for alcohol induced acute pancreatitis >> we stopped Januvia Tried insulin. Tried Byetta >> had low CBGs (" I bottomed out" - sugars 70).  She tried Januvia 100 mg. She tried Metformin >> nausea and diarrhea (has colitis).  Pt checks her sugars once a day in am and they are much improved: - am: 110-120 >> 131- 161 >> 95-146 >> 90-102, 138 (icecream the night before) - 2h after b'fast: 145-174 >> n/c - before lunch: n/c - 2h after lunch: n/c >> 83, 97 >> 121 - before dinner: n/c >> 81-97 >> n/c - 2h after dinner: 201 >> n/c >> 97-174 >> n/c  - bedtime: n/c - nighttime: n/c No lows. Lowest sugar was 80s; she has hypoglycemia awareness at 100.  Highest sugar was 138.  She started the Nutrisystem diet 3 weeks ago >> lost 10 lbs in last 1.5 mo! Before she started the diet, Pt's meals were: - Breakfast: cereal + banana + 2%milk; bagel with cream cheese - splits this with her parrot - Lunch:might skip; leftovers - baked potato + steak - Dinner: meat + starch + salad - Snacks: no  She drinks diet ginger ale ("all day")  - no CKD, last BUN/creatinine:  Lab Results  Component Value Date   BUN 15 12/30/2014   CREATININE 0.50 12/30/2014  On Lisinopril. - last set of lipids: Lab Results  Component Value Date   CHOL 202* 04/18/2014   HDL 50 04/18/2014   LDLCALC  135* 04/18/2014   LDLDIRECT 186.5 11/17/2006   TRIG 85 04/18/2014   CHOLHDL 4.0 04/18/2014  On Zetia. 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  - Last eye exam was 09/23/2014. She saw her optometrist >> cataracts, + DR. - + numbness and tingling in her L foot, but from L leg fx  I reviewed pt's medications, allergies, PMH, social hx, family hx, and changes were documented in the history of present illness. Otherwise, unchanged from my initial visit note.  ROS: Constitutional: no weight gain/loss, no fatigue, no subjective hyperthermia/hypothermia, no poor sleep Eyes: no blurry vision, no xerophthalmia ENT: no sore throat, no nodules palpated in throat, no occasional dysphagia/no odynophagia, + decreased hearing Cardiovascular: no CP/SOB/palpitations/+ intermittent leg swelling Respiratory: no cough/SOB Gastrointestinal: no N/V/D/C Musculoskeletal: no muscle aches/no joint aches Skin: no rashes Neurological: no tremors/numbness/tingling/dizziness  PE: BP 102/62 mmHg  Pulse 85  Temp(Src) 98.2 F (36.8 C) (Oral)  Resp 12  Wt 156 lb (70.761 kg)  SpO2 94% Body mass index is 27.64 kg/(m^2). Wt Readings from Last 3 Encounters:  02/10/15 156 lb (70.761 kg)  12/30/14 140 lb (63.504 kg)  11/01/14 154 lb (69.854 kg)   Constitutional: overweight, in NAD Eyes: PERRLA, EOMI, no exophthalmos ENT: moist mucous membranes, no thyromegaly, no cervical lymphadenopathy Cardiovascular: RRR, No MRG Respiratory: CTA B Gastrointestinal: abdomen soft,  NT, ND, BS+ Musculoskeletal: no deformities, strength intact in all 4 Skin: moist, warm, no rashes Neurological: no tremors with outstretched hands, DTR normal in all 4  ASSESSMENT: 1. DM2, non-insulin-dependent, uncontrolled, without complications  PLAN:  1. Patient with long-standing, recently more controlled diabetes, on Glipizide now, with excellent DM control. - I suggested to:  Patient Instructions  Please stop at the lab. Please continue  Glipizide XL 5 mg daily. Please let me know if the sugars are consistently <80 or >200. Please come back for a follow-up appointment in 3 months with your sugar log.  - Strongly advised her to start checking sugars at different times of the day - advised for yearly eye exams -  She is up to date - had the flu vaccine today - will check a HbA1c today - Return to clinic in 3 mo with sugar log   Office Visit on 02/10/2015  Component Date Value Ref Range Status  . Hgb A1c MFr Bld 02/10/2015 6.8* 4.6 - 6.5 % Final   Glycemic Control Guidelines for People with Diabetes:Non Diabetic:  <6%Goal of Therapy: <7%Additional Action Suggested:  >8%   Excellent HbA1c!

## 2015-02-10 NOTE — Patient Instructions (Signed)
Patient Instructions  Please stop at the lab. Please continue Glipizide XL 5 mg daily. Please let me know if the sugars are consistently <80 or >200. Please come back for a follow-up appointment in 3 months with your sugar log.

## 2015-03-14 ENCOUNTER — Emergency Department (HOSPITAL_COMMUNITY)
Admission: EM | Admit: 2015-03-14 | Discharge: 2015-03-15 | Disposition: A | Discharge status: Home / Self Care | Payer: 59 | Attending: Emergency Medicine | Admitting: Emergency Medicine

## 2015-03-14 ENCOUNTER — Encounter (HOSPITAL_COMMUNITY): Payer: Self-pay | Admitting: Emergency Medicine

## 2015-03-14 DIAGNOSIS — F10229 Alcohol dependence with intoxication, unspecified: Secondary | ICD-10-CM | POA: Diagnosis not present

## 2015-03-14 DIAGNOSIS — Z91040 Latex allergy status: Secondary | ICD-10-CM | POA: Insufficient documentation

## 2015-03-14 DIAGNOSIS — Z87891 Personal history of nicotine dependence: Secondary | ICD-10-CM | POA: Diagnosis not present

## 2015-03-14 DIAGNOSIS — I10 Essential (primary) hypertension: Secondary | ICD-10-CM | POA: Diagnosis not present

## 2015-03-14 DIAGNOSIS — Z79899 Other long term (current) drug therapy: Secondary | ICD-10-CM | POA: Insufficient documentation

## 2015-03-14 DIAGNOSIS — R251 Tremor, unspecified: Secondary | ICD-10-CM | POA: Insufficient documentation

## 2015-03-14 DIAGNOSIS — M797 Fibromyalgia: Secondary | ICD-10-CM | POA: Diagnosis not present

## 2015-03-14 DIAGNOSIS — E119 Type 2 diabetes mellitus without complications: Secondary | ICD-10-CM | POA: Insufficient documentation

## 2015-03-14 DIAGNOSIS — Z8719 Personal history of other diseases of the digestive system: Secondary | ICD-10-CM | POA: Insufficient documentation

## 2015-03-14 DIAGNOSIS — F329 Major depressive disorder, single episode, unspecified: Secondary | ICD-10-CM | POA: Insufficient documentation

## 2015-03-14 DIAGNOSIS — F10929 Alcohol use, unspecified with intoxication, unspecified: Secondary | ICD-10-CM

## 2015-03-14 DIAGNOSIS — F1020 Alcohol dependence, uncomplicated: Secondary | ICD-10-CM

## 2015-03-14 DIAGNOSIS — F1010 Alcohol abuse, uncomplicated: Secondary | ICD-10-CM | POA: Diagnosis present

## 2015-03-14 LAB — COMPREHENSIVE METABOLIC PANEL
ALT: 20 U/L (ref 0–35)
AST: 37 U/L (ref 0–37)
Albumin: 4.2 g/dL (ref 3.5–5.2)
Alkaline Phosphatase: 104 U/L (ref 39–117)
Anion gap: 12 (ref 5–15)
BUN: 15 mg/dL (ref 6–23)
CO2: 25 mmol/L (ref 19–32)
Calcium: 8.5 mg/dL (ref 8.4–10.5)
Chloride: 101 mmol/L (ref 96–112)
Creatinine, Ser: 0.56 mg/dL (ref 0.50–1.10)
GFR calc Af Amer: >90 mL/min (ref >90)
GFR calc non Af Amer: >90 mL/min (ref >90)
Glucose, Bld: 63 mg/dL — ABNORMAL LOW (ref 70–99)
Potassium: 3.7 mmol/L (ref 3.5–5.1)
Sodium: 138 mmol/L (ref 135–145)
Total Bilirubin: 0.8 mg/dL (ref 0.3–1.2)
Total Protein: 7.4 g/dL (ref 6.0–8.3)

## 2015-03-14 LAB — RAPID URINE DRUG SCREEN, HOSP PERFORMED
Amphetamines: NOT DETECTED
Barbiturates: NOT DETECTED
Benzodiazepines: NOT DETECTED
Cocaine: NOT DETECTED
Opiates: NOT DETECTED
Tetrahydrocannabinol: NOT DETECTED

## 2015-03-14 LAB — CBC
HCT: 37.8 % (ref 36.0–46.0)
Hemoglobin: 12.5 g/dL (ref 12.0–15.0)
MCH: 30.6 pg (ref 26.0–34.0)
MCHC: 33.1 g/dL (ref 30.0–36.0)
MCV: 92.6 fL (ref 78.0–100.0)
Platelets: 183 10*3/uL (ref 150–400)
RBC: 4.08 MIL/uL (ref 3.87–5.11)
RDW: 14 % (ref 11.5–15.5)
WBC: 9.2 10*3/uL (ref 4.0–10.5)

## 2015-03-14 LAB — ACETAMINOPHEN LEVEL: Acetaminophen (Tylenol), Serum: <10 ug/mL — ABNORMAL LOW (ref 10–30)

## 2015-03-14 LAB — SALICYLATE LEVEL: Salicylate Lvl: <4 mg/dL (ref 2.8–20.0)

## 2015-03-14 LAB — ETHANOL: Alcohol, Ethyl (B): 234 mg/dL — ABNORMAL HIGH (ref 0–9)

## 2015-03-14 MED ORDER — CHLORDIAZEPOXIDE HCL 25 MG PO CAPS: 25.0000 mg | ORAL_CAPSULE | Freq: Every day | ORAL | Status: DC

## 2015-03-14 MED ORDER — CHLORDIAZEPOXIDE HCL 25 MG PO CAPS: 25.0000 mg | ORAL_CAPSULE | ORAL | Status: DC

## 2015-03-14 MED ORDER — ONDANSETRON 4 MG PO TBDP
4.0000 mg | ORAL_TABLET | Freq: Four times a day (QID) | ORAL | Status: DC | PRN
  Administered 2015-03-15: 4 mg via ORAL
  Filled 2015-03-14: qty 1

## 2015-03-14 MED ORDER — CHLORDIAZEPOXIDE HCL 25 MG PO CAPS: 25.0000 mg | ORAL_CAPSULE | Freq: Three times a day (TID) | ORAL | Status: DC

## 2015-03-14 MED ORDER — CHLORDIAZEPOXIDE HCL 25 MG PO CAPS
25.0000 mg | ORAL_CAPSULE | Freq: Four times a day (QID) | ORAL | Status: DC
  Administered 2015-03-14: 25 mg via ORAL
  Filled 2015-03-14: qty 1

## 2015-03-14 MED ORDER — CHLORDIAZEPOXIDE HCL 25 MG PO CAPS
50.0000 mg | ORAL_CAPSULE | Freq: Once | ORAL | Status: AC
  Administered 2015-03-14: 50 mg via ORAL
  Filled 2015-03-14: qty 2

## 2015-03-14 MED ORDER — LOPERAMIDE HCL 2 MG PO CAPS: 2.0000 mg | ORAL_CAPSULE | ORAL | Status: DC | PRN

## 2015-03-14 MED ORDER — VITAMIN B-1 100 MG PO TABS
100.0000 mg | ORAL_TABLET | Freq: Every day | ORAL | Status: DC
Start: 2015-03-15 — End: 2015-03-15

## 2015-03-14 MED ORDER — HYDROXYZINE HCL 25 MG PO TABS
25.0000 mg | ORAL_TABLET | Freq: Four times a day (QID) | ORAL | Status: DC | PRN
  Administered 2015-03-15: 25 mg via ORAL
  Filled 2015-03-14: qty 1

## 2015-03-14 MED ORDER — CHLORDIAZEPOXIDE HCL 25 MG PO CAPS
25.0000 mg | ORAL_CAPSULE | Freq: Four times a day (QID) | ORAL | Status: DC | PRN
  Administered 2015-03-15: 25 mg via ORAL
  Filled 2015-03-14: qty 1

## 2015-03-14 MED ORDER — ADULT MULTIVITAMIN W/MINERALS CH: 1.0000 | ORAL_TABLET | Freq: Every day | ORAL | Status: DC

## 2015-03-14 NOTE — ED Provider Notes (Signed)
CSN: [id number]     Arrival date & time 03/14/15  2027 History   First MD Initiated Contact with Patient 03/14/15 2213     Chief Complaint  Patient presents with  . alcohol detox    . Fall  . Medical Clearance     (Consider location/radiation/quality/duration/timing/severity/associated sxs/prior Treatment) The history is provided by the patient.     [name] is a 69 y.o. female who presents for evaluation of alcoholism and requests for alcohol detoxification. She reports that she drinks heavily every day, more in the last week. Today, she and her husband made a pact that if she would quit drinking for one year, he might stay with her. This seems to be the reason that she decided to come here today. She reports last going through alcohol detoxification, about 8 months ago. She does not use illegal drugs. She has had some dizziness recently been no weakness or paresthesia. She denies fever, chills, cough, shortness of breath or chest pain. There are no other known modifying factors.   Past Medical History  Diagnosis Date  . Alcohol abuse   . Diabetes mellitus   . Hypertension   . Fibromyalgia   . Depression   . Carotid artery stenosis   . Diverticulitis   . Hepatitis 08/28/2014   Past Surgical History  Procedure Laterality Date  . Cesarean section    . Tonsillectomy    . Bunionectomy     Family History  Problem Relation Age of Onset  . Cancer Mother   . Heart disease Father   . Heart disease Sister    History  Substance Use Topics  . Smoking status: Former Smoker    Quit date: 03/11/2010  . Smokeless tobacco: Never Used  . Alcohol Use: Yes     Comment: 4-5 bottles of wine per day / 2-3 quarts per day   OB History    No data available     Review of Systems  All other systems reviewed and are negative.     Allergies  Latex; Lipitor; Codeine; Pentazocine lactate; and Sulfonamide derivatives  Home Medications   Prior to Admission medications    Medication Sig Start Date End Date Taking? Authorizing Provider  b complex vitamins tablet Take 1 tablet by mouth daily.   Yes Historical Provider, MD  escitalopram (LEXAPRO) 20 MG tablet Take 20 mg by mouth daily.   Yes Historical Provider, MD  ezetimibe (ZETIA) 10 MG tablet Take 1 tablet (10 mg total) by mouth daily. 07/09/14  Yes Philemon Kingdom, MD  lisinopril (PRINIVIL,ZESTRIL) 5 MG tablet Take 1 tablet (5 mg total) by mouth daily. 07/09/14  Yes Philemon Kingdom, MD  LYRICA 200 MG capsule Take 200 mg by mouth 2 (two) times daily.  06/13/14  Yes Historical Provider, MD  nebivolol (BYSTOLIC) 5 MG tablet Take 1 tablet (5 mg total) by mouth daily. 07/09/14  Yes Philemon Kingdom, MD  Omega-3 Fatty Acids (FISH OIL) 1000 MG CAPS Take 1 capsule by mouth 2 (two) times daily.   Yes Historical Provider, MD  diphenoxylate-atropine (LOMOTIL) 2.5-0.025 MG per tablet Take 1 tablet by mouth 4 (four) times daily as needed for diarrhea or loose stools. Patient not taking: Reported on 03/14/2015 07/19/14   [name], MD  glipiZIDE (GLUCOTROL XL) 5 MG 24 hr tablet Take 1 tablet (5 mg total) by mouth daily with breakfast. Patient not taking: Reported on 03/14/2015 02/10/15   Philemon Kingdom, MD  glucose blood (BAYER CONTOUR NEXT TEST) test strip  Use to test blood sugar 3 times daily as instructed. Dx code: E11.65 02/10/15   Philemon Kingdom, MD  Lancets (ACCU-CHEK MULTICLIX) lancets Use to test blood sugar 3 times daily as instructed. Dx: E11.65 02/10/15   Philemon Kingdom, MD  LORazepam (ATIVAN) 0.5 MG tablet Take 1 tablet (0.5 mg total) by mouth daily as needed for anxiety. Patient not taking: Reported on 03/14/2015 09/16/14   [name], MD  nitrofurantoin, macrocrystal-monohydrate, (MACROBID) 100 MG capsule Take 1 capsule (100 mg total) by mouth 2 (two) times daily. Patient not taking: Reported on 03/14/2015 12/30/14   Hyman Bible, PA-C  ondansetron (ZOFRAN) 8 MG tablet Take 1 tablet (8 mg total) by mouth every 8  (eight) hours as needed for nausea or vomiting. Patient not taking: Reported on 03/14/2015 09/16/14   [name], MD  oxyCODONE 10 MG TABS Take 1 tablet (10 mg total) by mouth every 6 (six) hours as needed for moderate pain. Patient not taking: Reported on 03/14/2015 09/05/14   Caren Griffins, MD  Oxycodone HCl 10 MG TABS Take 1 tablet (10 mg total) by mouth 4 (four) times daily. Patient not taking: Reported on 03/14/2015 09/07/14   Hyman Bible, PA-C  oxyCODONE-acetaminophen (PERCOCET) 5-325 MG per tablet 1 to 2 tablets every 6 hours as needed for pain. Patient not taking: Reported on 03/14/2015 09/11/14   Harden Mo, MD  promethazine (PHENERGAN) 12.5 MG tablet Take 1 tablet (12.5 mg total) by mouth every 6 (six) hours as needed for nausea or vomiting. Patient not taking: Reported on 03/14/2015 09/05/14   Caren Griffins, MD  promethazine (PHENERGAN) 25 MG tablet Take 1 tablet (25 mg total) by mouth every 6 (six) hours as needed for nausea or vomiting. Patient not taking: Reported on 03/14/2015 09/11/14   Harden Mo, MD  traMADol (ULTRAM) 50 MG tablet Take 1 tablet (50 mg total) by mouth every 8 (eight) hours as needed for moderate pain. Patient not taking: Reported on 03/15/2015 09/16/14   [name], MD   BP 146/73 mmHg  Pulse 102  Temp(Src) 97.8 F (36.6 C) (Oral)  Resp 17  Ht 5\' 4"  (1.626 m)  Wt 140 lb (63.504 kg)  BMI 24.02 kg/m2  SpO2 98% Physical Exam  Constitutional: She is oriented to person, place, and time. She appears well-developed. She appears distressed (she is uncomfortable, tearful.).  Ill kempt. She appears older than stated age.  HENT:  Head: Normocephalic and atraumatic.  Right Ear: External ear normal.  Left Ear: External ear normal.  Eyes: Conjunctivae and EOM are normal. Pupils are equal, round, and reactive to light.  Neck: Normal range of motion and phonation normal. Neck supple.  Cardiovascular: Normal rate, regular rhythm and normal heart sounds.    Pulmonary/Chest: Effort normal and breath sounds normal. She exhibits no bony tenderness.  Abdominal: Soft. There is no tenderness.  Musculoskeletal: Normal range of motion.  Neurological: She is alert and oriented to person, place, and time. No cranial nerve deficit or sensory deficit. She exhibits normal muscle tone. Coordination normal.  Mild tremor. No asterixis. Dysarthria without aphasia, or nystagmus.  Skin: Skin is warm, dry and intact.  Psychiatric: Her behavior is normal.  Tearful at times.  Nursing note and vitals reviewed.   ED Course  Procedures (including critical care time)   22:40- patient is intoxicated, she wants alcohol  detoxification. The TTS team, is not currently admitting alcohol detoxification as an isolated problem. The patient is currently tremulous, and will require an  observation, to determine her needs for inpatient, versus outpatient treatment.  Medications  thiamine (VITAMIN B-1) tablet 100 mg (not administered)  multivitamin with minerals tablet 1 tablet (not administered)  chlordiazePOXIDE (LIBRIUM) capsule 25 mg (not administered)  hydrOXYzine (ATARAX/VISTARIL) tablet 25 mg (not administered)  loperamide (IMODIUM) capsule 2-4 mg (not administered)  ondansetron (ZOFRAN-ODT) disintegrating tablet 4 mg (not administered)  chlordiazePOXIDE (LIBRIUM) capsule 25 mg (25 mg Oral Given 03/14/15 2310)    Followed by  chlordiazePOXIDE (LIBRIUM) capsule 25 mg (not administered)    Followed by  chlordiazePOXIDE (LIBRIUM) capsule 25 mg (not administered)    Followed by  chlordiazePOXIDE (LIBRIUM) capsule 25 mg (not administered)  chlordiazePOXIDE (LIBRIUM) capsule 50 mg (50 mg Oral Given 03/14/15 2311)    Patient Vitals for the past 24 hrs:  BP Temp Temp src Pulse Resp SpO2 Height Weight  03/14/15 2207 146/73 mmHg - - 102 - - - -  03/14/15 2053 146/73 mmHg 97.8 F (36.6 C) Oral 102 17 98 % 5\' 4"  (1.626 m) 140 lb (63.504 kg)    11:43 PM Reevaluation with  update and discussion. After initial assessment and treatment, an updated evaluation reveals she is resting comfortably currently. [name]    Labs Review Labs Reviewed  ACETAMINOPHEN LEVEL - Abnormal; Notable for the following:    Acetaminophen (Tylenol), Serum <10.0 (*)    All other components within normal limits  COMPREHENSIVE METABOLIC PANEL - Abnormal; Notable for the following:    Glucose, Bld 63 (*)    All other components within normal limits  ETHANOL - Abnormal; Notable for the following:    Alcohol, Ethyl (B) 234 (*)    All other components within normal limits  CBC  SALICYLATE LEVEL  URINE RAPID DRUG SCREEN (HOSP PERFORMED)    Imaging Review No results found.   EKG Interpretation None      MDM   Final diagnoses:  Alcoholism  Alcohol intoxication, with unspecified complication    Alcoholism with alcohol intoxication. She does not have any other associated psychiatric issue. She has tremor, when seen, with elevated alcohol level, greater than 200. She will require observation, and treatment, then determination for need for inpatient stay for alcohol detoxification, versus discharge with at home treatments.  Nursing Notes Reviewed/ Care Coordinated, and agree without changes. Applicable Imaging Reviewed.  Interpretation of Laboratory Data incorporated into ED treatment  Plan: As per oncoming provider team, and above plan    Daleen Bo, MD 03/15/15 [id number]

## 2015-03-14 NOTE — ED Notes (Signed)
Pt requesting alcohol detox. Pt reports she drinks multiple bottles of wine a day but is unable to say how many. Last drink was about 45 minutes ago. Pt also reports fall this morning and states she hit her head. Pt denies pain at this time. Denies SI/HI.

## 2015-03-15 MED ORDER — CHLORDIAZEPOXIDE HCL 25 MG PO CAPS: ORAL_CAPSULE | ORAL | Status: DC

## 2015-03-15 MED ORDER — HYDROXYZINE HCL 25 MG PO TABS
25.0000 mg | ORAL_TABLET | Freq: Once | ORAL | Status: AC
  Administered 2015-03-15: 25 mg via ORAL
  Filled 2015-03-15: qty 1

## 2015-03-15 NOTE — ED Notes (Addendum)
Pt. In burgundy scrubs. Pt. And belongings searched and wanded by security. Pt. Has 1 belongings and black over nite bag. Pt. Has shirt, pants, gray/white tennis shoes, burgundy pants, green hat and black bag with clothes, glasses (at bedside) earrings in black bag, wedding band on right ring finger. Pt. Belongings locked up in [facility] [address] in the [facility]CU near the psych-ed.

## 2015-03-15 NOTE — ED Notes (Signed)
Pt. On phone talking to husband about picking her up.

## 2015-03-15 NOTE — ED Notes (Signed)
Pt. Noted sleeping in room. No complaints or concerns voiced. No distress or abnormal behavior noted. Will continue to monitor with security cameras. Q 15 minute rounds continue. 

## 2015-03-15 NOTE — ED Notes (Signed)
Pt. Still c/o anxiety.

## 2015-03-15 NOTE — ED Notes (Signed)
Pt. Ambulated to the bathroom and back to her hall bed without difficulty. Pt. Gait steady on her feet.

## 2015-03-15 NOTE — Discharge Instructions (Signed)
°Emergency Department Resource Guide °1) Find a Doctor and Pay Out of Pocket °Although you won't have to find out who is covered by your insurance plan, it is a good idea to ask around and get recommendations. You will then need to call the office and see if the doctor you have chosen will accept you as a new patient and what types of options they offer for patients who are self-pay. Some doctors offer discounts or will set up payment plans for their patients who do not have insurance, but you will need to ask so you aren't surprised when you get to your appointment. ° °2) Contact Your Local Health Department °Not all health departments have doctors that can see patients for sick visits, but many do, so it is worth a call to see if yours does. If you don't know where your local health department is, you can check in your phone book. The CDC also has a tool to help you locate your state's health department, and many state websites also have listings of all of their local health departments. ° °3) Find a Walk-in Clinic °If your illness is not likely to be very severe or complicated, you may want to try a walk in clinic. These are popping up all over the country in pharmacies, drugstores, and shopping centers. They're usually staffed by nurse practitioners or physician assistants that have been trained to treat common illnesses and complaints. They're usually fairly quick and inexpensive. However, if you have serious medical issues or chronic medical problems, these are probably not your best option. ° °No Primary Care Doctor: °- Call [facility] at  832-8000 - they can help you locate a primary care doctor that  accepts your insurance, provides certain services, etc. °- Physician Referral Service- 1-800-533-3463 ° °Chronic Pain Problems: °Organization         Address  Phone   Notes  °[facility]  (336) 297-2271 Patients need to be referred by their primary care doctor.  ° °Medication  Assistance: °Organization         Address  Phone   Notes  °[place] [facility] [address] °[place], [place] [address] (336) 641-8030 --Must be a resident of [place] °-- Must have NO insurance coverage whatsoever (no Medicaid/ Medicare, etc.) °-- The pt. MUST have a primary care doctor that directs their care regularly and follows them in the community °  °[facility]  (866) 331-1348   °[employer]  (888) 892-1162   ° °Agencies that provide inexpensive medical care: °Organization         Address  Phone   Notes  °[facility]  (336) 832-8035   °[facility]    (336) 832-7272   °[facility] [address] °[place], [place] [address] (336) 832-4777   °[facility] [address], °[place] (336) 271-4999   °[facility]    (336) 373-0678   °[facility]    (336) 272-1050   °[facility] ° [address], [place] Phone:  (336) 832-4444, Fax:  (336) 832-4440 Hours of Operation:  9 am - 6 pm, M-F.  Also accepts Medicaid/Medicare and self-pay.  °[facility] ° [address], [place] Phone: (336) 832-3150, Fax: (336) [phone]. Hours of Operation:  8:30 am - 5:30 pm, M-F.  Also accepts Medicaid and self-pay.  °[facility] [place] [address], [place] Phone: (336) 878-6027   °[facility] [address], [place], [place] (336)723-1848, Ext. 123 Mondays & Thursdays: 7-9 AM.  First 15 patients are seen on a first come, first serve basis. °  ° °Medicaid-accepting [place] Providers: ° °Organization         Address  Phone   Notes  °[facility] [address], [place] (336) 641-2100 Also accepts self-pay patients.  °[facility] [address], [place] ° (336) 856-9996   °[facility] [address], [place]  (336) 288-8857   °[facility] [address] [place] [address], [place] (336) 299-7000   °Veita Bland [address], [place]  ° (336) 373-1557 Only accepts [place] Access Medicaid patients after they have their name applied to their card.  ° °Self-Pay (no insurance) in [place]: ° °Organization         Address  Phone   Notes  °Sickle Cell Patients, [facility] [address], [place] (336) 832-1970   °[facility] [address], [place] (336) 832-4400   °[facility] ° [address], [place] (336) 992-4800   °[facility]/Dr. Osei-Bonsu ° [address] [place] [address], [place] or [address], [place] (336) 841-8500 Phone number for both [place] and [place] locations is the same.  °[facility] [address], [place] (336) 299-0000   °[facility] [address] [place] [address], [place] or [address] (336) 852-7530 °(336) 878-2260   °[facility] [address], [place] (336) 350-1642, phone; (336) 294-5005, fax Sees patients 1st and 3rd Saturday of every month.  Must not qualify for public or private insurance (i.e. Medicaid, Medicare, [place] Health Choice, Veterans' Benefits) • Household income should be no more than 200% of the poverty level •The clinic cannot treat you if you are pregnant or think you are pregnant • Sexually transmitted diseases are not treated at the clinic.  ° ° °Dental Care: °Organization         Address  Phone  Notes  °[place] [facility] [address], [place] (336) 641-6152 Accepts children up to age 21 who are enrolled in Medicaid or [place] Health Choice; pregnant women with a Medicaid card; and children who have applied for Medicaid or [place] Health Choice, but were declined, whose parents can pay a reduced fee at time of service.  °[place]  [facility] [place]  [address], [place] (336) 641-7733 Accepts children up to age 21 who are enrolled in Medicaid or [place] Health Choice; pregnant women with a Medicaid card; and children who have applied for Medicaid or [place] Health Choice, but were declined, whose parents can pay a reduced fee at time of service.  °[facility] ° [address], [place] (336) 641-4533 Patients are seen by appointment only. Walk-ins are not accepted. [facility] will see patients 18 years of age and older. °Monday - Tuesday (8am-5pm) °Most Wednesdays (8:30-5pm) °$30 per visit, cash only  °[facility] ° [address], [place] (336) 641-4533 Patients are seen by appointment only. Walk-ins are not accepted. [facility] will see patients 18 years of age and older. °One   Wednesday Evening (Monthly: Volunteer Based).  $30 per visit, cash only  °[facility]  (919) 537-3737 for adults; Children under age 4, call Graduate Pediatric Dentistry at (919) [phone]. Children aged 4-14, please call (919) [phone] to request a pediatric application. ° Dental services are provided in all areas of dental care including fillings, crowns and bridges, complete and partial dentures, implants, gum treatment, root canals, and extractions. Preventive care is also provided. Treatment is provided to both adults and children. °Patients are selected via a lottery and there is often a waiting list. °  °[facility] [address], °[place] ° (336) 763-8833 [email or web address] °  °[facility] [address], [place], [place] (336)723-1848, Ext. 123 Second and Fourth Thursday of each month, opens at 6:30 AM; Clinic ends at 9 AM.  Patients are seen on a first-come first-served basis, and a limited number are seen during each clinic.  ° °[facility] ° [address], [place], [place] (336) 723-7904    Eligibility Requirements °You must have lived in [place], [place], or [place] counties for at least the last three months. °  You cannot be eligible for state or federal sponsored healthcare insurance, including Veterans Administration, Medicaid, or Medicare. °  You generally cannot be eligible for healthcare insurance through your employer.  °  How to apply: °Eligibility screenings are held every Tuesday and Wednesday afternoon from 1:00 pm until 4:00 pm. You do not need an appointment for the interview!  °[facility] [address], [place], [place] 336-631-2330   °[facility]  336-342-8273   °[facility]  336-703-3100   °[facility]  336-570-6415   ° °Behavioral Health Resources in the Community: °Intensive Outpatient Programs °Organization         Address  Phone  Notes  °[facility] [address], [place], [place] 336-878-6098   °[facility] [address], [place], [place] 336-832-9800   °[facility] [address], [place], [place] ° 336-882-2125   °[facility] [address],  °[place], [place] 1-800-853-5163 or 336-641-4981   °Substance Abuse Resources °Organization         Address  Phone  Notes  °[facility]  336-882-2125   °[facility]  336-784-9470   °[facility]  336-285-9073   °[facility]  336-845-3988   °Residential & Outpatient Substance Abuse Program  1-800-659-3381   °Psychological Services °Organization         Address  Phone  Notes  °[facility]  336- 832-9600   °[facility]  336- 378-7881   °[facility] [address], [place] 1-800-853-5163 or 336-641-4981   ° °Mobile Crisis Teams °Organization         Address  Phone  Notes  °[facility], Mobile Crisis Care Unit  1-877-626-1772   °Assertive °[facility] ° [address]  [place], [place] 336-834-9664   °Sharon DeEsch [address] °[place] 336-554-5454   ° °Self-Help/Support Groups °Organization         Address  Phone             Notes  °[facility] - variety of support groups  336- 373-1402 Call for more information  °Narcotics Anonymous (NA), [facility] [address], °[place]  2 meetings at this location  ° °  Residential Treatment Programs °Organization         Address  Phone  Notes  °[facility] [address],    °[place]  1-866-801-8205   °[facility] ° [address], [place], [place] 704-293-8524   °[facility] [address], [place] 336-845-3988 Admissions: 8am-3pm M-F  °[facility] [address]    °[place], [place] 336-841-1104   °[facility] [address], [place], [place] 336-379-7146   °[facility] [address]  °[place], [place] 336-285-9073   °[facility] Intensive Outpatient [address], [place], [place] 336-852-3033   °[facility] [address]  °[place], [place] 1-877-615-2722 or 336-784-9470   °[facility] [address] [place], [place] 336-227-7417 Accepts Medicaid  °[facility] [address]  °[place] 1-800-659-3381 Substance Abuse/Addiction Treatment  ° °[place] Behavioral Health Resources °Organization         Address  Phone  Notes  °[facility]  (888) 581-9988   °Julie Brannon, PhD [address] [place], [place]   (336) 349-5553 or (336) 951-0000   °[facility]   [address] °[place], [place] (336) 349-4454   °[facility] [address], [place], [place] (336) 342-8316 Insurance/Medicaid/sponsorship through [facility]  °[facility] [address]                                    [place], [place] (336) 342-8316 Therapy/tele-psych/case    °[facility] [address]  ° [place],  (336) 349-2233    °Dr. Arfeen  (336) 349-4544   °[facility] [place]  [employer] [place] [facility] 1) [address],  °2) [address], [place] °3)  [address], [place] (336) 349-3220 °(336) 342-7768 ° °(336) 342-8140   °[place] [facility] (336) 342-1394 or (336) 342-3537 (After Hours)    ° ° °

## 2015-03-15 NOTE — ED Notes (Signed)
Pt. Noted in hall. Complaining of "feeling worse". No acute distress or abnormal behavior noted. Will continue to monitor with security cameras. Q 15 minute rounds continue.

## 2015-03-15 NOTE — ED Notes (Signed)
Pt. To [facility] from ED ambulatory without difficulty, to room 35 . Report from Autumn RN. Pt. Is alert and oriented, warm and dry in no distress. Pt. Denies SI, HI, and AVH. Pt. Calm and cooperative. Pt. Made aware of security cameras and Q15 minute rounds. Pt. Encouraged to let Nursing staff know of any concerns or needs.

## 2015-03-20 ENCOUNTER — Telehealth: Payer: Self-pay | Admitting: Internal Medicine

## 2015-03-20 NOTE — Telephone Encounter (Signed)
Pt wants Dr. [place] Phlegm to give her a few names of who she can see for ENT

## 2015-03-20 NOTE — Telephone Encounter (Signed)
Please read note below and advise.  

## 2015-03-21 ENCOUNTER — Telehealth: Payer: Self-pay | Admitting: Internal Medicine

## 2015-03-21 NOTE — Telephone Encounter (Signed)
Pt asking for Dr.Gherghe's advise on a good ENT MD. She is requesting some urgency regarding this matter please.

## 2015-03-21 NOTE — Telephone Encounter (Signed)
Please read message below and advise.  

## 2015-03-21 NOTE — Telephone Encounter (Signed)
I sent a message with this yesterday, but I am not sure what happened with it, sorry. Leane Para. Redmond Baseman, M.D [facility]

## 2015-03-24 NOTE — Telephone Encounter (Signed)
Called pt and lvm advising her (and apologizing) per Dr Arman Filter message below.

## 2015-05-13 ENCOUNTER — Ambulatory Visit: Payer: Self-pay | Admitting: Internal Medicine

## 2015-07-12 ENCOUNTER — Other Ambulatory Visit: Payer: Self-pay | Admitting: Internal Medicine

## 2015-09-26 ENCOUNTER — Other Ambulatory Visit: Payer: Self-pay | Admitting: Internal Medicine

## 2016-01-23 ENCOUNTER — Inpatient Hospital Stay (HOSPITAL_COMMUNITY)
Admission: EM | Admit: 2016-01-23 | Discharge: 2016-01-28 | DRG: 897 | Disposition: A | Discharge status: Home / Self Care | Payer: 59 | Attending: Internal Medicine | Admitting: Internal Medicine

## 2016-01-23 ENCOUNTER — Encounter (HOSPITAL_COMMUNITY): Payer: Self-pay | Admitting: Emergency Medicine

## 2016-01-23 DIAGNOSIS — F10239 Alcohol dependence with withdrawal, unspecified: Principal | ICD-10-CM | POA: Diagnosis present

## 2016-01-23 DIAGNOSIS — IMO0002 Reserved for concepts with insufficient information to code with codable children: Secondary | ICD-10-CM | POA: Diagnosis present

## 2016-01-23 DIAGNOSIS — F10929 Alcohol use, unspecified with intoxication, unspecified: Secondary | ICD-10-CM | POA: Diagnosis present

## 2016-01-23 DIAGNOSIS — R945 Abnormal results of liver function studies: Secondary | ICD-10-CM

## 2016-01-23 DIAGNOSIS — Z8249 Family history of ischemic heart disease and other diseases of the circulatory system: Secondary | ICD-10-CM

## 2016-01-23 DIAGNOSIS — Z809 Family history of malignant neoplasm, unspecified: Secondary | ICD-10-CM

## 2016-01-23 DIAGNOSIS — F10229 Alcohol dependence with intoxication, unspecified: Secondary | ICD-10-CM | POA: Diagnosis present

## 2016-01-23 DIAGNOSIS — Z7984 Long term (current) use of oral hypoglycemic drugs: Secondary | ICD-10-CM

## 2016-01-23 DIAGNOSIS — E1165 Type 2 diabetes mellitus with hyperglycemia: Secondary | ICD-10-CM | POA: Diagnosis present

## 2016-01-23 DIAGNOSIS — M797 Fibromyalgia: Secondary | ICD-10-CM | POA: Diagnosis present

## 2016-01-23 DIAGNOSIS — G894 Chronic pain syndrome: Secondary | ICD-10-CM | POA: Diagnosis present

## 2016-01-23 DIAGNOSIS — Z79899 Other long term (current) drug therapy: Secondary | ICD-10-CM

## 2016-01-23 DIAGNOSIS — Z885 Allergy status to narcotic agent status: Secondary | ICD-10-CM

## 2016-01-23 DIAGNOSIS — I10 Essential (primary) hypertension: Secondary | ICD-10-CM | POA: Diagnosis present

## 2016-01-23 DIAGNOSIS — Z888 Allergy status to other drugs, medicaments and biological substances status: Secondary | ICD-10-CM

## 2016-01-23 DIAGNOSIS — Y908 Blood alcohol level of 240 mg/100 ml or more: Secondary | ICD-10-CM | POA: Diagnosis present

## 2016-01-23 DIAGNOSIS — N390 Urinary tract infection, site not specified: Secondary | ICD-10-CM

## 2016-01-23 DIAGNOSIS — F329 Major depressive disorder, single episode, unspecified: Secondary | ICD-10-CM | POA: Diagnosis present

## 2016-01-23 DIAGNOSIS — Z882 Allergy status to sulfonamides status: Secondary | ICD-10-CM

## 2016-01-23 DIAGNOSIS — Z87891 Personal history of nicotine dependence: Secondary | ICD-10-CM

## 2016-01-23 DIAGNOSIS — E876 Hypokalemia: Secondary | ICD-10-CM | POA: Diagnosis present

## 2016-01-23 DIAGNOSIS — Z91040 Latex allergy status: Secondary | ICD-10-CM

## 2016-01-23 DIAGNOSIS — E1139 Type 2 diabetes mellitus with other diabetic ophthalmic complication: Secondary | ICD-10-CM | POA: Diagnosis present

## 2016-01-23 DIAGNOSIS — K529 Noninfective gastroenteritis and colitis, unspecified: Secondary | ICD-10-CM | POA: Diagnosis present

## 2016-01-23 DIAGNOSIS — R7989 Other specified abnormal findings of blood chemistry: Secondary | ICD-10-CM | POA: Diagnosis present

## 2016-01-23 DIAGNOSIS — F1020 Alcohol dependence, uncomplicated: Secondary | ICD-10-CM | POA: Diagnosis present

## 2016-01-23 DIAGNOSIS — R509 Fever, unspecified: Secondary | ICD-10-CM

## 2016-01-23 DIAGNOSIS — K700 Alcoholic fatty liver: Secondary | ICD-10-CM | POA: Diagnosis present

## 2016-01-23 DIAGNOSIS — R740 Nonspecific elevation of levels of transaminase and lactic acid dehydrogenase [LDH]: Secondary | ICD-10-CM | POA: Diagnosis present

## 2016-01-23 DIAGNOSIS — B9620 Unspecified Escherichia coli [E. coli] as the cause of diseases classified elsewhere: Secondary | ICD-10-CM | POA: Diagnosis present

## 2016-01-23 LAB — CBC WITH DIFFERENTIAL/PLATELET
Basophils Absolute: 0 10*3/uL (ref 0.0–0.1)
Basophils Relative: 1 %
Eosinophils Absolute: 0 10*3/uL (ref 0.0–0.7)
Eosinophils Relative: 0 %
HCT: 41.7 % (ref 36.0–46.0)
Hemoglobin: 14.2 g/dL (ref 12.0–15.0)
Lymphocytes Relative: 23 %
Lymphs Abs: 1.1 10*3/uL (ref 0.7–4.0)
MCH: 31.4 pg (ref 26.0–34.0)
MCHC: 34.1 g/dL (ref 30.0–36.0)
MCV: 92.3 fL (ref 78.0–100.0)
Monocytes Absolute: 0.5 10*3/uL (ref 0.1–1.0)
Monocytes Relative: 10 %
Neutro Abs: 3.1 10*3/uL (ref 1.7–7.7)
Neutrophils Relative %: 66 %
Platelets: 214 10*3/uL (ref 150–400)
RBC: 4.52 MIL/uL (ref 3.87–5.11)
RDW: 14.3 % (ref 11.5–15.5)
WBC: 4.8 10*3/uL (ref 4.0–10.5)

## 2016-01-23 LAB — ETHANOL: Alcohol, Ethyl (B): 335 mg/dL (ref <5)

## 2016-01-23 MED ORDER — LORAZEPAM 1 MG PO TABS: 0.0000 mg | ORAL_TABLET | Freq: Two times a day (BID) | ORAL | Status: DC

## 2016-01-23 MED ORDER — LORAZEPAM 1 MG PO TABS
0.0000 mg | ORAL_TABLET | Freq: Four times a day (QID) | ORAL | Status: DC
  Administered 2016-01-24: 1 mg via ORAL
  Filled 2016-01-23: qty 1

## 2016-01-23 NOTE — ED Notes (Signed)
Bed: WA07 Expected date:  Expected time:  Means of arrival:  Comments: EMS 

## 2016-01-23 NOTE — ED Notes (Signed)
Patient walked to conference room for tts without any help or any difficulty.

## 2016-01-23 NOTE — BH Assessment (Signed)
Pt unable to be assess at this time. Pt will have a psychiatric evaluation in the morning. Informed Dr. Zenia Resides.

## 2016-01-23 NOTE — ED Provider Notes (Signed)
CSN: [id number]     Arrival date & time 01/23/16  2155 History   First MD Initiated Contact with Patient 01/23/16 2205     Chief Complaint  Patient presents with  . Alcohol Intoxication     (Consider location/radiation/quality/duration/timing/severity/associated sxs/prior Treatment) HPI Comments: Patient here requesting detox from alcohol. Patient is unclear as to how much she drinks a day but states that she has been trying to drink less. Denies any suicidal or homicidal ideations. She endorses tremors but denies any prior history of seizures. EMS was called and patient was not cooperative. Denies any somatic complaints of at this time. No further history obtainable due to her current condition  Patient is a 70 y.o. female presenting with intoxication. The history is provided by the patient. The history is limited by the condition of the patient.  Alcohol Intoxication    Past Medical History  Diagnosis Date  . Alcohol abuse   . Diabetes mellitus   . Hypertension   . Fibromyalgia   . Depression   . Carotid artery stenosis   . Diverticulitis   . Hepatitis 08/28/2014   Past Surgical History  Procedure Laterality Date  . Cesarean section    . Tonsillectomy    . Bunionectomy     Family History  Problem Relation Age of Onset  . Cancer Mother   . Heart disease Father   . Heart disease Sister    Social History  Substance Use Topics  . Smoking status: Former Smoker    Quit date: 03/11/2010  . Smokeless tobacco: Never Used  . Alcohol Use: Yes     Comment: 4-5 bottles of wine per day / 2-3 quarts per day   OB History    No data available     Review of Systems  Unable to perform ROS: Acuity of condition      Allergies  Latex; Lipitor; Codeine; Pentazocine lactate; and Sulfonamide derivatives  Home Medications   Prior to Admission medications   Medication Sig Start Date End Date Taking? Authorizing Provider  b complex vitamins tablet Take 1 tablet by mouth daily.     Historical Provider, MD  BYSTOLIC 5 MG tablet TAKE 1 TABLET DAILY 09/26/15   Philemon Kingdom, MD  chlordiazePOXIDE (LIBRIUM) 25 MG capsule 50mg  PO TID x 1D, then 25-50mg  PO BID X 1D, then 25-50mg  PO QD X 1D 03/15/15   Jola Schmidt, MD  diphenoxylate-atropine (LOMOTIL) 2.5-0.025 MG per tablet Take 1 tablet by mouth 4 (four) times daily as needed for diarrhea or loose stools. Patient not taking: Reported on 03/14/2015 07/19/14   [name], MD  escitalopram (LEXAPRO) 20 MG tablet Take 20 mg by mouth daily.    Historical Provider, MD  ezetimibe (ZETIA) 10 MG tablet Take 1 tablet (10 mg total) by mouth daily. 07/09/14   Philemon Kingdom, MD  glipiZIDE (GLUCOTROL XL) 5 MG 24 hr tablet Take 1 tablet (5 mg total) by mouth daily with breakfast. Patient not taking: Reported on 03/14/2015 02/10/15   Philemon Kingdom, MD  glipiZIDE (GLUCOTROL XL) 5 MG 24 hr tablet TAKE 2 TABLETS DAILY WITH BREAKFAST 07/14/15   Philemon Kingdom, MD  glucose blood (BAYER CONTOUR NEXT TEST) test strip Use to test blood sugar 3 times daily as instructed. Dx code: E11.65 02/10/15   Philemon Kingdom, MD  Lancets (ACCU-CHEK MULTICLIX) lancets Use to test blood sugar 3 times daily as instructed. Dx: E11.65 02/10/15   Philemon Kingdom, MD  lisinopril (PRINIVIL,ZESTRIL) 5 MG tablet TAKE 1 TABLET BY  MOUTH EVERY DAY 07/14/15   Philemon Kingdom, MD  LORazepam (ATIVAN) 0.5 MG tablet Take 1 tablet (0.5 mg total) by mouth daily as needed for anxiety. Patient not taking: Reported on 03/14/2015 09/16/14   [name], MD  LYRICA 200 MG capsule Take 200 mg by mouth 2 (two) times daily.  06/13/14   Historical Provider, MD  nitrofurantoin, macrocrystal-monohydrate, (MACROBID) 100 MG capsule Take 1 capsule (100 mg total) by mouth 2 (two) times daily. Patient not taking: Reported on 03/14/2015 12/30/14   Hyman Bible, PA-C  Omega-3 Fatty Acids (FISH OIL) 1000 MG CAPS Take 1 capsule by mouth 2 (two) times daily.    Historical Provider, MD  ondansetron (ZOFRAN) 8  MG tablet Take 1 tablet (8 mg total) by mouth every 8 (eight) hours as needed for nausea or vomiting. Patient not taking: Reported on 03/14/2015 09/16/14   [name], MD  oxyCODONE 10 MG TABS Take 1 tablet (10 mg total) by mouth every 6 (six) hours as needed for moderate pain. Patient not taking: Reported on 03/14/2015 09/05/14   Caren Griffins, MD  Oxycodone HCl 10 MG TABS Take 1 tablet (10 mg total) by mouth 4 (four) times daily. Patient not taking: Reported on 03/14/2015 09/07/14   Hyman Bible, PA-C  oxyCODONE-acetaminophen (PERCOCET) 5-325 MG per tablet 1 to 2 tablets every 6 hours as needed for pain. Patient not taking: Reported on 03/14/2015 09/11/14   Harden Mo, MD  promethazine (PHENERGAN) 12.5 MG tablet Take 1 tablet (12.5 mg total) by mouth every 6 (six) hours as needed for nausea or vomiting. Patient not taking: Reported on 03/14/2015 09/05/14   Caren Griffins, MD  promethazine (PHENERGAN) 25 MG tablet Take 1 tablet (25 mg total) by mouth every 6 (six) hours as needed for nausea or vomiting. Patient not taking: Reported on 03/14/2015 09/11/14   Harden Mo, MD  traMADol (ULTRAM) 50 MG tablet Take 1 tablet (50 mg total) by mouth every 8 (eight) hours as needed for moderate pain. Patient not taking: Reported on 03/15/2015 09/16/14   [name], MD   BP 146/78 mmHg  Pulse 92  Temp(Src) 98.4 F (36.9 C) (Oral)  Resp 20  SpO2 100% Physical Exam  Constitutional: She is oriented to person, place, and time. She appears well-developed and well-nourished.  Non-toxic appearance. No distress.  HENT:  Head: Normocephalic and atraumatic.  Eyes: Conjunctivae, EOM and lids are normal. Pupils are equal, round, and reactive to light.  Neck: Normal range of motion. Neck supple. No tracheal deviation present. No thyroid mass present.  Cardiovascular: Normal rate, regular rhythm and normal heart sounds.  Exam reveals no gallop.   No murmur heard. Pulmonary/Chest: Effort normal and breath sounds  normal. No stridor. No respiratory distress. She has no decreased breath sounds. She has no wheezes. She has no rhonchi. She has no rales.  Abdominal: Soft. Normal appearance and bowel sounds are normal. She exhibits no distension. There is no tenderness. There is no rebound and no CVA tenderness.  Musculoskeletal: Normal range of motion. She exhibits no edema or tenderness.  Neurological: She is alert and oriented to person, place, and time. She has normal strength. No cranial nerve deficit or sensory deficit. GCS eye subscore is 4. GCS verbal subscore is 5. GCS motor subscore is 6.  Skin: Skin is warm and dry. No abrasion and no rash noted.  Psychiatric: Her mood appears anxious. Her speech is rapid and/or pressured. She is agitated.  Nursing note and vitals  reviewed.   ED Course  Procedures (including critical care time) Labs Review Labs Reviewed  ETHANOL  URINE RAPID DRUG SCREEN, HOSP PERFORMED  CBC WITH DIFFERENTIAL/PLATELET  COMPREHENSIVE METABOLIC PANEL    Imaging Review No results found. I have personally reviewed and evaluated these images and lab results as part of my medical decision-making.   EKG Interpretation None      MDM   Final diagnoses:  None    Patient started on alcohol detox protocol with Ativan. Labs are pending at this time and will be referred to TTS for placement    Lacretia Leigh, MD 01/23/16 2232

## 2016-01-23 NOTE — ED Notes (Signed)
Patient wants detox. Patient is yelling and being not cooperative.

## 2016-01-23 NOTE — BH Assessment (Addendum)
Tele Assessment Note   [name] is an 70 y.o. female presenting to [facility] requesting detox. Pt stated "I am going to [employer]". Pt was uncooperative and did not participate in the assessment.  Pt will have an am psych eval.   Diagnosis: Alcohol use disorder, severe   Past Medical History:  Past Medical History  Diagnosis Date  . Alcohol abuse   . Diabetes mellitus   . Hypertension   . Fibromyalgia   . Depression   . Carotid artery stenosis   . Diverticulitis   . Hepatitis 08/28/2014    Past Surgical History  Procedure Laterality Date  . Cesarean section    . Tonsillectomy    . Bunionectomy      Family History:  Family History  Problem Relation Age of Onset  . Cancer Mother   . Heart disease Father   . Heart disease Sister     Social History:  reports that she quit smoking about 5 years ago. She has never used smokeless tobacco. She reports that she drinks alcohol. She reports that she does not use illicit drugs.  Additional Social History:  Alcohol / Drug Use History of alcohol / drug use?: Yes Longest period of sobriety (when/how long): unable to maintain sobriety for substantial amount of time  Substance #1 Name of Substance 1: Alcohol  1 - Age of First Use: unable to assess 1 - Amount (size/oz): unable to assess 1 - Frequency: unable to assess 1 - Duration: unable to assess 1 - Last Use / Amount: 01-23-16  CIWA: CIWA-Ar BP: 146/78 mmHg Pulse Rate: 92 Auditory Disturbances: moderate harshness or ability to frighten COWS:    PATIENT STRENGTHS: (choose at least two) Average or above average intelligence Communication skills  Allergies:  Allergies  Allergen Reactions  . Latex Hives  . Lipitor [Atorvastatin Calcium] Other (See Comments)    Muscle aches  . Codeine Rash    And nausea   . Pentazocine Lactate Rash  . Sulfonamide Derivatives Rash    Home Medications:  (Not in a hospital admission)  OB/GYN Status:  No LMP recorded. Patient  is postmenopausal.  General Assessment Data Location of Assessment: [facility] TTS Assessment: In system Is this a Tele or Face-to-Face Assessment?: Face-to-Face Is this an Initial Assessment or a Re-assessment for this encounter?: Initial Assessment Living Arrangements: Alone Can pt return to current living arrangement?: Yes Admission Status: Voluntary Is patient capable of signing voluntary admission?: Yes Referral Source: Self/Family/Friend Insurance type: [place] Living Arrangements: Alone Name of Psychiatrist:  (Unable to assess) Name of Therapist:  (Unable to assess)  Education Status Is patient currently in school?: No Current Grade: N/A Highest grade of school patient has completed: N/A Name of school: N/A Contact person: N/A  Risk to self with the past 6 months Suicidal Ideation:  (Unable to assess) Has patient been a risk to self within the past 6 months prior to admission? :  (Unable to assess) Suicidal Intent:  (Unable to assess) Has patient had any suicidal intent within the past 6 months prior to admission? :  (Unable to assess) Is patient at risk for suicide?:  (Unable to assess) Suicidal Plan?:  (Unable to assess) Has patient had any suicidal plan within the past 6 months prior to admission? :  (Unable to assess) Access to Means:  (Unable to assess) What has been your use of drugs/alcohol within the last 12 months?: Alcohol use reported Previous Attempts/Gestures:  (  Unable to assess) How many times?:  (Unable to assess ) Other Self Harm Risks: Unable to assess Triggers for Past Attempts: Unknown Intentional Self Injurious Behavior:  (Unable to assess) Family Suicide History: Unable to assess Recent stressful life event(s):  (Unable to assess) Persecutory voices/beliefs?:  (Unable to assess) Depression:  (Unable to assess ) Depression Symptoms:  (Unable to assess) Substance abuse history and/or treatment for substance abuse?: Yes Suicide  prevention information given to non-admitted patients: Not applicable  Risk to Others within the past 6 months Homicidal Ideation:  (Unable to assess) Does patient have any lifetime risk of violence toward others beyond the six months prior to admission? : Unknown Thoughts of Harm to Others:  (Unable to assess ) Current Homicidal Intent:  (Unable to assess) Current Homicidal Plan:  (Unable to assess) Access to Homicidal Means:  (Unable to assess ) Identified Victim:  (Unable to assess) History of harm to others?:  (Unable to assess) Assessment of Violence:  (Unable to assess) Violent Behavior Description:  (Unable to assess) Does patient have access to weapons?:  (Unable to assess) Criminal Charges Pending?:  (Unable to assess) Does patient have a court date:  (Unable to assess) Is patient on probation?: Unknown  Psychosis Hallucinations:  (Unable to assess) Delusions:  (Unable to assess)  Mental Status Report Appearance/Hygiene: Unremarkable Eye Contact: Fair Motor Activity: Unremarkable Speech: Loud Level of Consciousness: Irritable Mood: Irritable Affect: Irritable Anxiety Level: Minimal Thought Processes: Coherent, Relevant Judgement: Impaired Orientation: Unable to assess Obsessive Compulsive Thoughts/Behaviors: Unable to Assess  Cognitive Functioning Concentration: Unable to Assess Memory: Unable to Assess IQ: Average Insight: Unable to Assess Impulse Control: Unable to Assess Appetite:  (Unable to assess ) Weight Loss:  (Unable to assess) Weight Gain:  (Unable to assess) Sleep: Unable to Assess Vegetative Symptoms: Unable to Assess  ADLScreening [facility] Assessment Services) Patient's cognitive ability adequate to safely complete daily activities?: Yes Patient able to express need for assistance with ADLs?: Yes Independently performs ADLs?: Yes (appropriate for developmental age)  Prior Inpatient Therapy Prior Inpatient Therapy: Yes Prior Therapy Dates:  2005-2015 Prior Therapy Facilty/Provider(s): [facility] Reason for Treatment: Substance abuse   Prior Outpatient Therapy Prior Outpatient Therapy: Yes Prior Therapy Dates: 2008, 2011 Prior Therapy Facilty/Provider(s): [facility]  Reason for Treatment: [facility] Does patient have an ACCT team?: No Does patient have Intensive In-House Services?  : No Does patient have [facility] services? : No Does patient have P4CC services?: No  ADL Screening (condition at time of admission) Patient's cognitive ability adequate to safely complete daily activities?: Yes Patient able to express need for assistance with ADLs?: Yes Independently performs ADLs?: Yes (appropriate for developmental age)       Abuse/Neglect Assessment (Assessment to be complete while patient is alone) Physical Abuse:  (unable to assess) Verbal Abuse:  (unable to assess ) Sexual Abuse:  (unable to assess) Exploitation of patient/patient's resources:  (unable to assess ) Self-Neglect:  (unable to assess)     Advance Directives (For Healthcare) Does patient have an advance directive?: No Would patient like information on creating an advanced directive?: No - patient declined information    Additional Information 1:1 In Past 12 Months?: No CIRT Risk: No Elopement Risk: No Does patient have medical clearance?: No (Labs pending )     Disposition:  Disposition Initial Assessment Completed for this Encounter: Yes Disposition of Patient: Other dispositions Other disposition(s): Other (Comment) (AM psych )  Fitz Matsuo S 01/23/2016 11:58 PM

## 2016-01-24 ENCOUNTER — Inpatient Hospital Stay (HOSPITAL_COMMUNITY): Payer: 59

## 2016-01-24 ENCOUNTER — Emergency Department (HOSPITAL_COMMUNITY): Payer: 59

## 2016-01-24 DIAGNOSIS — F10229 Alcohol dependence with intoxication, unspecified: Secondary | ICD-10-CM | POA: Diagnosis present

## 2016-01-24 DIAGNOSIS — R740 Nonspecific elevation of levels of transaminase and lactic acid dehydrogenase [LDH]: Secondary | ICD-10-CM | POA: Diagnosis present

## 2016-01-24 DIAGNOSIS — Z882 Allergy status to sulfonamides status: Secondary | ICD-10-CM | POA: Diagnosis not present

## 2016-01-24 DIAGNOSIS — F10129 Alcohol abuse with intoxication, unspecified: Secondary | ICD-10-CM

## 2016-01-24 DIAGNOSIS — Z885 Allergy status to narcotic agent status: Secondary | ICD-10-CM | POA: Diagnosis not present

## 2016-01-24 DIAGNOSIS — Z8249 Family history of ischemic heart disease and other diseases of the circulatory system: Secondary | ICD-10-CM | POA: Diagnosis not present

## 2016-01-24 DIAGNOSIS — Y908 Blood alcohol level of 240 mg/100 ml or more: Secondary | ICD-10-CM | POA: Diagnosis present

## 2016-01-24 DIAGNOSIS — Z7984 Long term (current) use of oral hypoglycemic drugs: Secondary | ICD-10-CM | POA: Diagnosis not present

## 2016-01-24 DIAGNOSIS — Z809 Family history of malignant neoplasm, unspecified: Secondary | ICD-10-CM | POA: Diagnosis not present

## 2016-01-24 DIAGNOSIS — M797 Fibromyalgia: Secondary | ICD-10-CM | POA: Diagnosis present

## 2016-01-24 DIAGNOSIS — G894 Chronic pain syndrome: Secondary | ICD-10-CM | POA: Diagnosis present

## 2016-01-24 DIAGNOSIS — E1139 Type 2 diabetes mellitus with other diabetic ophthalmic complication: Secondary | ICD-10-CM | POA: Diagnosis present

## 2016-01-24 DIAGNOSIS — F329 Major depressive disorder, single episode, unspecified: Secondary | ICD-10-CM | POA: Diagnosis present

## 2016-01-24 DIAGNOSIS — E1165 Type 2 diabetes mellitus with hyperglycemia: Secondary | ICD-10-CM | POA: Diagnosis present

## 2016-01-24 DIAGNOSIS — Z79899 Other long term (current) drug therapy: Secondary | ICD-10-CM | POA: Diagnosis not present

## 2016-01-24 DIAGNOSIS — Z888 Allergy status to other drugs, medicaments and biological substances status: Secondary | ICD-10-CM | POA: Diagnosis not present

## 2016-01-24 DIAGNOSIS — K700 Alcoholic fatty liver: Secondary | ICD-10-CM | POA: Diagnosis present

## 2016-01-24 DIAGNOSIS — R7989 Other specified abnormal findings of blood chemistry: Secondary | ICD-10-CM | POA: Diagnosis present

## 2016-01-24 DIAGNOSIS — F10231 Alcohol dependence with withdrawal delirium: Secondary | ICD-10-CM | POA: Diagnosis not present

## 2016-01-24 DIAGNOSIS — Z87891 Personal history of nicotine dependence: Secondary | ICD-10-CM | POA: Diagnosis not present

## 2016-01-24 DIAGNOSIS — I10 Essential (primary) hypertension: Secondary | ICD-10-CM | POA: Diagnosis present

## 2016-01-24 DIAGNOSIS — Z91040 Latex allergy status: Secondary | ICD-10-CM | POA: Diagnosis not present

## 2016-01-24 DIAGNOSIS — R945 Abnormal results of liver function studies: Secondary | ICD-10-CM

## 2016-01-24 DIAGNOSIS — N390 Urinary tract infection, site not specified: Secondary | ICD-10-CM | POA: Diagnosis present

## 2016-01-24 DIAGNOSIS — F10230 Alcohol dependence with withdrawal, uncomplicated: Secondary | ICD-10-CM | POA: Diagnosis not present

## 2016-01-24 DIAGNOSIS — E876 Hypokalemia: Secondary | ICD-10-CM | POA: Diagnosis present

## 2016-01-24 DIAGNOSIS — F10120 Alcohol abuse with intoxication, uncomplicated: Secondary | ICD-10-CM | POA: Diagnosis not present

## 2016-01-24 DIAGNOSIS — F10239 Alcohol dependence with withdrawal, unspecified: Secondary | ICD-10-CM | POA: Diagnosis present

## 2016-01-24 DIAGNOSIS — B9620 Unspecified Escherichia coli [E. coli] as the cause of diseases classified elsewhere: Secondary | ICD-10-CM | POA: Diagnosis present

## 2016-01-24 DIAGNOSIS — K529 Noninfective gastroenteritis and colitis, unspecified: Secondary | ICD-10-CM | POA: Diagnosis present

## 2016-01-24 LAB — COMPREHENSIVE METABOLIC PANEL
ALT: 505 U/L — ABNORMAL HIGH (ref 14–54)
AST: 1717 U/L — ABNORMAL HIGH (ref 15–41)
Albumin: 3.9 g/dL (ref 3.5–5.0)
Alkaline Phosphatase: 86 U/L (ref 38–126)
Anion gap: 14 (ref 5–15)
BUN: 18 mg/dL (ref 6–20)
CO2: 21 mmol/L — ABNORMAL LOW (ref 22–32)
Calcium: 7.9 mg/dL — ABNORMAL LOW (ref 8.9–10.3)
Chloride: 96 mmol/L — ABNORMAL LOW (ref 101–111)
Creatinine, Ser: 0.51 mg/dL (ref 0.44–1.00)
GFR calc Af Amer: >60 mL/min (ref >60)
GFR calc non Af Amer: >60 mL/min (ref >60)
Glucose, Bld: 213 mg/dL — ABNORMAL HIGH (ref 65–99)
Potassium: 3.8 mmol/L (ref 3.5–5.1)
Sodium: 131 mmol/L — ABNORMAL LOW (ref 135–145)
Total Bilirubin: 1.8 mg/dL — ABNORMAL HIGH (ref 0.3–1.2)
Total Protein: 6.7 g/dL (ref 6.5–8.1)

## 2016-01-24 LAB — RAPID URINE DRUG SCREEN, HOSP PERFORMED
Amphetamines: NOT DETECTED
Barbiturates: NOT DETECTED
Benzodiazepines: NOT DETECTED
Cocaine: NOT DETECTED
Opiates: NOT DETECTED
Tetrahydrocannabinol: NOT DETECTED

## 2016-01-24 LAB — PROTIME-INR
INR: 1.46 (ref 0.00–1.49)
Prothrombin Time: 17.3 seconds — ABNORMAL HIGH (ref 11.6–15.2)

## 2016-01-24 LAB — GLUCOSE, CAPILLARY
Glucose-Capillary: 143 mg/dL — ABNORMAL HIGH (ref 65–99)
Glucose-Capillary: 134 mg/dL — ABNORMAL HIGH (ref 65–99)
Glucose-Capillary: 124 mg/dL — ABNORMAL HIGH (ref 65–99)
Glucose-Capillary: 114 mg/dL — ABNORMAL HIGH (ref 65–99)
Glucose-Capillary: 87 mg/dL (ref 65–99)

## 2016-01-24 LAB — ACETAMINOPHEN LEVEL: Acetaminophen (Tylenol), Serum: <10 ug/mL — ABNORMAL LOW (ref 10–30)

## 2016-01-24 LAB — APTT: aPTT: 31 seconds (ref 24–37)

## 2016-01-24 LAB — SALICYLATE LEVEL: Salicylate Lvl: <4 mg/dL (ref 2.8–30.0)

## 2016-01-24 MED ORDER — LORAZEPAM 1 MG PO TABS
0.0000 mg | ORAL_TABLET | ORAL | Status: DC
Start: 2016-01-24 — End: 2016-01-28
  Administered 2016-01-24 (×3): 1 mg via ORAL
  Administered 2016-01-24: 4 mg via ORAL
  Administered 2016-01-24 (×2): 1 mg via ORAL
  Administered 2016-01-24: 2 mg via ORAL
  Administered 2016-01-24: 4 mg via ORAL
  Administered 2016-01-25 (×2): 1 mg via ORAL
  Administered 2016-01-25 (×2): 2 mg via ORAL
  Administered 2016-01-25 (×2): 1 mg via ORAL
  Administered 2016-01-25: 2 mg via ORAL
  Administered 2016-01-25: 1 mg via ORAL
  Administered 2016-01-26 (×10): 2 mg via ORAL
  Filled 2016-01-24 (×4): qty 2
  Filled 2016-01-24: qty 1
  Filled 2016-01-24: qty 2
  Filled 2016-01-24: qty 1
  Filled 2016-01-24: qty 4
  Filled 2016-01-24 (×4): qty 2
  Filled 2016-01-24: qty 4
  Filled 2016-01-24: qty 2
  Filled 2016-01-24: qty 1
  Filled 2016-01-24: qty 2
  Filled 2016-01-24: qty 1
  Filled 2016-01-24 (×9): qty 2

## 2016-01-24 MED ORDER — DIPHENOXYLATE-ATROPINE 2.5-0.025 MG PO TABS
1.0000 | ORAL_TABLET | Freq: Two times a day (BID) | ORAL | Status: DC
  Administered 2016-01-24 – 2016-01-28 (×9): 1 via ORAL
  Filled 2016-01-24 (×9): qty 1

## 2016-01-24 MED ORDER — LOPERAMIDE HCL 2 MG PO CAPS
2.0000 mg | ORAL_CAPSULE | ORAL | Status: DC | PRN
  Administered 2016-01-26: 2 mg via ORAL
  Filled 2016-01-24 (×2): qty 1

## 2016-01-24 MED ORDER — HYDROMORPHONE HCL 1 MG/ML IJ SOLN: 0.5000 mg | INTRAMUSCULAR | Status: DC | PRN

## 2016-01-24 MED ORDER — ONDANSETRON 4 MG PO TBDP
4.0000 mg | ORAL_TABLET | Freq: Once | ORAL | Status: AC
  Administered 2016-01-24: 4 mg via ORAL
  Filled 2016-01-24: qty 1

## 2016-01-24 MED ORDER — SODIUM CHLORIDE 0.9 % IV SOLN
INTRAVENOUS | Status: DC
Start: 2016-01-24 — End: 2016-01-24

## 2016-01-24 MED ORDER — IBUPROFEN 200 MG PO TABS
400.0000 mg | ORAL_TABLET | ORAL | Status: DC | PRN
  Administered 2016-01-24 – 2016-01-27 (×3): 400 mg via ORAL
  Filled 2016-01-24 (×3): qty 2

## 2016-01-24 MED ORDER — DIPHENOXYLATE-ATROPINE 2.5-0.025 MG PO TABS: 1.0000 | ORAL_TABLET | Freq: Four times a day (QID) | ORAL | Status: DC | PRN

## 2016-01-24 MED ORDER — SODIUM CHLORIDE 0.9 % IV BOLUS (SEPSIS)
1000.0000 mL | Freq: Once | INTRAVENOUS | Status: AC
  Administered 2016-01-24: 1000 mL via INTRAVENOUS

## 2016-01-24 MED ORDER — LORAZEPAM 1 MG PO TABS: 0.0000 mg | ORAL_TABLET | Freq: Four times a day (QID) | ORAL | Status: DC

## 2016-01-24 MED ORDER — PROMETHAZINE HCL 25 MG/ML IJ SOLN: 12.5000 mg | Freq: Four times a day (QID) | INTRAMUSCULAR | Status: DC | PRN

## 2016-01-24 MED ORDER — ONDANSETRON HCL 4 MG/2ML IJ SOLN
4.0000 mg | Freq: Four times a day (QID) | INTRAMUSCULAR | Status: DC | PRN
  Administered 2016-01-24: 4 mg via INTRAVENOUS
  Filled 2016-01-24: qty 2

## 2016-01-24 MED ORDER — LORCASERIN HCL ER 20 MG PO TB24: 1.0000 | ORAL_TABLET | Freq: Every day | ORAL | Status: DC

## 2016-01-24 MED ORDER — EZETIMIBE 10 MG PO TABS
10.0000 mg | ORAL_TABLET | Freq: Every day | ORAL | Status: DC
  Administered 2016-01-24 – 2016-01-25 (×2): 10 mg via ORAL
  Filled 2016-01-24 (×2): qty 1

## 2016-01-24 MED ORDER — ESCITALOPRAM OXALATE 10 MG PO TABS
20.0000 mg | ORAL_TABLET | Freq: Every day | ORAL | Status: DC
  Administered 2016-01-24: 20 mg via ORAL
  Administered 2016-01-25: 10 mg via ORAL
  Administered 2016-01-26 – 2016-01-28 (×3): 20 mg via ORAL
  Filled 2016-01-24 (×5): qty 2

## 2016-01-24 MED ORDER — SODIUM CHLORIDE 0.9 % IV SOLN
INTRAVENOUS | Status: DC
  Administered 2016-01-24 – 2016-01-25 (×2): via INTRAVENOUS

## 2016-01-24 MED ORDER — LORAZEPAM 1 MG PO TABS: 0.0000 mg | ORAL_TABLET | Freq: Two times a day (BID) | ORAL | Status: DC

## 2016-01-24 MED ORDER — INSULIN ASPART 100 UNIT/ML ~~LOC~~ SOLN: 0.0000 [IU] | SUBCUTANEOUS | Status: DC

## 2016-01-24 MED ORDER — LORAZEPAM 2 MG/ML IJ SOLN
1.0000 mg | Freq: Once | INTRAMUSCULAR | Status: AC
  Administered 2016-01-24: 1 mg via INTRAVENOUS
  Filled 2016-01-24: qty 1

## 2016-01-24 MED ORDER — ONDANSETRON HCL 4 MG PO TABS
4.0000 mg | ORAL_TABLET | Freq: Four times a day (QID) | ORAL | Status: DC | PRN
  Administered 2016-01-27: 4 mg via ORAL
  Filled 2016-01-24: qty 1

## 2016-01-24 MED ORDER — ALUM & MAG HYDROXIDE-SIMETH 200-200-20 MG/5ML PO SUSP
30.0000 mL | Freq: Four times a day (QID) | ORAL | Status: DC | PRN
  Filled 2016-01-24: qty 30

## 2016-01-24 MED ORDER — ACETAMINOPHEN 325 MG PO TABS: 325.0000 mg | ORAL_TABLET | Freq: Three times a day (TID) | ORAL | Status: DC | PRN

## 2016-01-24 MED ORDER — ACETAMINOPHEN 650 MG RE SUPP: 650.0000 mg | Freq: Four times a day (QID) | RECTAL | Status: DC | PRN

## 2016-01-24 MED ORDER — NEBIVOLOL HCL 5 MG PO TABS
5.0000 mg | ORAL_TABLET | Freq: Every day | ORAL | Status: DC
  Administered 2016-01-24 – 2016-01-28 (×5): 5 mg via ORAL
  Filled 2016-01-24 (×5): qty 1

## 2016-01-24 MED ORDER — VITAMIN B-1 100 MG PO TABS
100.0000 mg | ORAL_TABLET | Freq: Every day | ORAL | Status: DC
  Administered 2016-01-24 – 2016-01-28 (×5): 100 mg via ORAL
  Filled 2016-01-24 (×5): qty 1

## 2016-01-24 NOTE — ED Notes (Signed)
Pt walked to and from the restroom unassisted, about 15 feet.

## 2016-01-24 NOTE — H&P (Addendum)
[facility] Admission History and Physical       AVAIAH GULICK [id number] DOB: [birth year] DOA: 01/23/2016  Referring physician:  EDP PCP: [name], MD  Specialists:   Chief Complaint: Wants Alcohol Detox   HPI: [name] is a 70 y.o. female with a history of Alcohol Dependence, HTN, DM2 who presents to the ED to undergo Alcohol Detox treatment.  She reports drinking heavily at times, 2 bottles of wine daily, and she has been trying to detox herself, but has had symptoms of withdrawal.   She reports trying to supplement her wine drinking with beer instead.   Her last drink was at noon.   She was evaluated in the ED and found to have a Alcohol level in the 300's.   She reports feeling shakey and agitated.   She was started on the CIWA protocol with IV Ativan.   Her labs also revealed elevated transaminases with an AST = 1717, and and ALT of 505.  AN Ultrasound of the ABD was performed and was negative for Cholelithiasis, but did revealed Fatty Liver changes.  She was referred for admission.      Review of Systems:  Constitutional: No Weight Loss, No Weight Gain, Night Sweats, Fevers, Chills, Dizziness, Light Headedness, Fatigue, or Generalized Weakness HEENT: No Headaches, Difficulty Swallowing,Tooth/Dental Problems,Sore Throat,  No Sneezing, Rhinitis, Ear Ache, Nasal Congestion, or Post Nasal Drip,  Cardio-vascular:  No Chest pain, Orthopnea, PND, Edema in Lower Extremities, Anasarca, Dizziness, Palpitations  Resp: No Dyspnea, No DOE, No Productive Cough, No Non-Productive Cough, No Hemoptysis, No Wheezing.    GI: No Heartburn, Indigestion, Abdominal Pain, Nausea, Vomiting, Diarrhea, Constipation, Hematemesis, Hematochezia, Melena, Change in Bowel Habits,  Loss of Appetite  GU: No Dysuria, No Change in Color of Urine, No Urgency or Urinary Frequency, No Flank pain.  Musculoskeletal: No Joint Pain or Swelling, No Decreased Range of Motion, No Back Pain.  Neurologic:  No Syncope, No Seizures, Muscle Weakness, Paresthesia, Vision Disturbance or Loss, No Diplopia, No Vertigo, No Difficulty Walking,  Skin: No Rash or Lesions. Psych: No Change in Mood or Affect, No Depression, +Agitation,  or Anxiety, No Memory loss, No Confusion, or Hallucinations   Past Medical History  Diagnosis Date  . Alcohol abuse   . Diabetes mellitus   . Hypertension   . Fibromyalgia   . Depression   . Carotid artery stenosis   . Diverticulitis   . Hepatitis 08/28/2014     Past Surgical History  Procedure Laterality Date  . Cesarean section    . Tonsillectomy    . Bunionectomy        Prior to Admission medications   Medication Sig Start Date End Date Taking? Authorizing Provider  amphetamine-dextroamphetamine (ADDERALL XR) 30 MG 24 hr capsule Take 30 mg by mouth daily.  11/25/10  Yes Historical Provider, MD  b complex vitamins tablet Take 1 tablet by mouth daily.   Yes Historical Provider, MD  BELVIQ XR 20 MG TB24 Take 1 tablet by mouth daily. 12/19/15  Yes Historical Provider, MD  BYSTOLIC 5 MG tablet TAKE 1 TABLET DAILY 09/26/15  Yes Philemon Kingdom, MD  diphenoxylate-atropine (LOMOTIL) 2.5-0.025 MG tablet Take 1 tablet by mouth 4 (four) times daily as needed for diarrhea or loose stools.  07/08/10  Yes Historical Provider, MD  escitalopram (LEXAPRO) 20 MG tablet Take 20 mg by mouth daily.   Yes Historical Provider, MD  ezetimibe (ZETIA) 10 MG tablet Take 1 tablet (10 mg total) by mouth  daily. 07/09/14  Yes Philemon Kingdom, MD  glipiZIDE (GLUCOTROL XL) 5 MG 24 hr tablet TAKE 2 TABLETS DAILY WITH BREAKFAST 07/14/15  Yes Philemon Kingdom, MD  glucose blood (BAYER CONTOUR NEXT TEST) test strip Use to test blood sugar 3 times daily as instructed. Dx code: E11.65 02/10/15  Yes Philemon Kingdom, MD  Lancets (ACCU-CHEK MULTICLIX) lancets Use to test blood sugar 3 times daily as instructed. Dx: E11.65 02/10/15  Yes Philemon Kingdom, MD  lisinopril (PRINIVIL,ZESTRIL) 5 MG tablet TAKE 1  TABLET BY MOUTH EVERY DAY 07/14/15  Yes Philemon Kingdom, MD  zaleplon (SONATA) 10 MG capsule TAKE 1-2 CAPSULES BY MOUTH AT BEDTIME.--REFILL ON 2/22 AND 3/34/17 01/05/16  Yes Historical Provider, MD  chlordiazePOXIDE (LIBRIUM) 25 MG capsule 50mg  PO TID x 1D, then 25-50mg  PO BID X 1D, then 25-50mg  PO QD X 1D Patient not taking: Reported on 01/23/2016 03/15/15   Jola Schmidt, MD  diphenoxylate-atropine (LOMOTIL) 2.5-0.025 MG per tablet Take 1 tablet by mouth 4 (four) times daily as needed for diarrhea or loose stools. Patient not taking: Reported on 03/14/2015 07/19/14   [name], MD  glipiZIDE (GLUCOTROL XL) 5 MG 24 hr tablet Take 1 tablet (5 mg total) by mouth daily with breakfast. Patient not taking: Reported on 03/14/2015 02/10/15   Philemon Kingdom, MD  hydrochlorothiazide (HYDRODIURIL) 12.5 MG tablet Take 12.5 mg by mouth daily.  01/21/16   Historical Provider, MD  hydrOXYzine (VISTARIL) 25 MG capsule TAKE 1-3 CAPSULES BY MOUTH ONCE DAILY AS NEEDED FOR ALLERGIES OR ITCHING 01/12/16   Historical Provider, MD  LORazepam (ATIVAN) 0.5 MG tablet Take 1 tablet (0.5 mg total) by mouth daily as needed for anxiety. Patient not taking: Reported on 03/14/2015 09/16/14   [name], MD  nitrofurantoin, macrocrystal-monohydrate, (MACROBID) 100 MG capsule Take 1 capsule (100 mg total) by mouth 2 (two) times daily. Patient not taking: Reported on 03/14/2015 12/30/14   Hyman Bible, PA-C  ondansetron (ZOFRAN) 8 MG tablet Take 1 tablet (8 mg total) by mouth every 8 (eight) hours as needed for nausea or vomiting. Patient not taking: Reported on 03/14/2015 09/16/14   [name], MD  oxyCODONE 10 MG TABS Take 1 tablet (10 mg total) by mouth every 6 (six) hours as needed for moderate pain. Patient not taking: Reported on 03/14/2015 09/05/14   Caren Griffins, MD  Oxycodone HCl 10 MG TABS Take 1 tablet (10 mg total) by mouth 4 (four) times daily. Patient not taking: Reported on 03/14/2015 09/07/14   Hyman Bible, PA-C    oxyCODONE-acetaminophen (PERCOCET) 5-325 MG per tablet 1 to 2 tablets every 6 hours as needed for pain. Patient not taking: Reported on 03/14/2015 09/11/14   Harden Mo, MD  promethazine (PHENERGAN) 12.5 MG tablet Take 1 tablet (12.5 mg total) by mouth every 6 (six) hours as needed for nausea or vomiting. Patient not taking: Reported on 03/14/2015 09/05/14   Caren Griffins, MD  promethazine (PHENERGAN) 25 MG tablet Take 1 tablet (25 mg total) by mouth every 6 (six) hours as needed for nausea or vomiting. Patient not taking: Reported on 03/14/2015 09/11/14   Harden Mo, MD  traMADol (ULTRAM) 50 MG tablet Take 1 tablet (50 mg total) by mouth every 8 (eight) hours as needed for moderate pain. Patient not taking: Reported on 03/15/2015 09/16/14   [name], MD     Allergies  Allergen Reactions  . Latex Hives  . Lipitor [Atorvastatin Calcium] Other (See Comments)    Muscle aches  .  Codeine Rash    And nausea   . Pentazocine Lactate Rash  . Sulfonamide Derivatives Rash    Social History:  reports that she quit smoking about 5 years ago. She has never used smokeless tobacco. She reports that she drinks alcohol. She reports that she does not use illicit drugs.    Family History  Problem Relation Age of Onset  . Cancer Mother   . Heart disease Father   . Heart disease Sister        Physical Exam:  GEN:  Well Nourished and Well Developed  70 y.o. [name] examined and in no acute distress; cooperative with exam Filed Vitals:   01/23/16 2248 01/24/16 0300 01/24/16 0358 01/24/16 0449  BP: 146/78 153/82 164/77 155/87  Pulse: 92 103 99 107  Temp:      TempSrc:      Resp:   16 16  SpO2:  97% 95% 96%   Blood pressure 155/87, pulse 107, temperature 98.4 F (36.9 C), temperature source Oral, resp. rate 16, SpO2 96 %. PSYCH: She is alert and oriented x4; does not appear anxious does not appear depressed; affect is normal HEENT: Normocephalic and Atraumatic, Mucous membranes  pink; PERRLA; EOM intact; Fundi:  Benign;  No scleral icterus, Nares: Patent, Oropharynx: Clear, Fair Dentition,    Neck:  FROM, No Cervical Lymphadenopathy nor Thyromegaly or Carotid Bruit; No JVD; Breasts:: Not examined CHEST WALL: No tenderness CHEST: Normal respiration, clear to auscultation bilaterally HEART: Regular rate and rhythm; no murmurs rubs or gallops BACK: No kyphosis or scoliosis; No CVA tenderness ABDOMEN: Positive Bowel Sounds, Soft Non-Tender, No Rebound or Guarding; No Masses, No Organomegaly Rectal Exam: Not done EXTREMITIES: No Cyanosis, Clubbing, or Edema; No Ulcerations. Genitalia: not examined PULSES: 2+ and symmetric SKIN: Normal hydration no rash or ulceration CNS:  Alert and Oriented x 4, No Focal Deficits Vascular: pulses palpable throughout    Labs on Admission:  Basic Metabolic Panel:  Recent Labs Lab 01/23/16 2317  NA 131*  K 3.8  CL 96*  CO2 21*  GLUCOSE 213*  BUN 18  CREATININE 0.51  CALCIUM 7.9*   Liver Function Tests:  Recent Labs Lab 01/23/16 2317  AST 1717*  ALT 505*  ALKPHOS 86  BILITOT 1.8*  PROT 6.7  ALBUMIN 3.9   No results for input(s): LIPASE, AMYLASE in the last 168 hours. No results for input(s): AMMONIA in the last 168 hours. CBC:  Recent Labs Lab 01/23/16 2317  WBC 4.8  NEUTROABS 3.1  HGB 14.2  HCT 41.7  MCV 92.3  PLT 214   Cardiac Enzymes: No results for input(s): CKTOTAL, CKMB, CKMBINDEX, TROPONINI in the last 168 hours.  BNP (last 3 results) No results for input(s): BNP in the last 8760 hours.  ProBNP (last 3 results) No results for input(s): PROBNP in the last 8760 hours.  CBG: No results for input(s): GLUCAP in the last 168 hours.  Radiological Exams on Admission: US Abdomen Complete  01/24/2016  CLINICAL DATA:  70 year old female with elevated liver function tests. EXAM: ABDOMEN ULTRASOUND COMPLETE COMPARISON:  CT dated 08/28/2014 FINDINGS: Gallbladder: No gallstones or wall thickening  visualized. No sonographic Murphy sign noted by sonographer. Common bile duct: Diameter: 4 mm Liver: Diffuse increased echogenicity compatible with fatty infiltration. IVC: No abnormality visualized. Pancreas: Visualized portion unremarkable. Spleen: Size and appearance within normal limits. Right Kidney: Length: 11 cm. Echogenicity within normal limits. No mass or hydronephrosis visualized. Left Kidney: Length: 11 cm. Echogenicity within normal limits. No  mass or hydronephrosis visualized. Abdominal aorta: The visualized aorta appears unremarkable. Other findings:  None IMPRESSION: Diffuse fatty infiltration of the liver otherwise unremarkable abdominal ultrasound. Electronically Signed   By: Anner Crete M.D.   On: 01/24/2016 03:06           Assessment/Plan:   70 y.o. female with  Active Problems:    1.   Alcohol intoxication (HCC)/EtOH dependence ([place])    CIWA Protocol with PRN IV/PO Ativan    Social Work consult for Rehab Placement          2.   Elevated LFTs- due to Alcoholism or Viral Etiology, [place] of ABD = Negative for Gallstones, + Fatty Liver    Hepatitis Panel ordered    Hold Zetia, Tramadol, Adderal, and Glipzide Rx          3.   Essential hypertension    Monitor BPs    Continue Bystolic Rx    Hold HCTZ      4.   DM (diabetes mellitus), type 2, uncontrolled w/ophthalmic complication (HCC)    Hold Glipizide Rx    SSI coverage PRN    Check HbA1C in AM      5.   Chronic pain syndrome    PRN IV Dilaudid       6.   DVT Prophylaxis    SCDs     Code Status:     FULL CODE     Family Communication:   No Family Present    Disposition Plan:    Inpatient Status       Time spent: [facility] Pager [phone]   If [place] Please Contact the Day Rounding Team MD for [facility]  If 7PM-7AM, Please Contact Night-Floor Coverage  [email or web address] Password [email or web address] 01/24/2016, 4:58 AM     ADDENDUM:   Patient was seen and  examined on 01/24/2016

## 2016-01-24 NOTE — Progress Notes (Signed)
[facility] PROGRESS NOTE  [name] [id number] DOB: June 07, 1946 DOA: 01/23/2016 PCP: [name], MD  Brief Summary    Assessment/Plan  Alcohol withdrawal, hx of hallucinations but not seizures and has never needed ICU stay.  Having worsening nausea and vomiting -  Increase to CIWA q2h  -  Continue MVI, thiamine, folate -  [facility] -  Continue zofran and add phenergan prn -  Change to full liquids to advance as tolerated  Transaminitis due to EtOH abuse, trending down with abstinence -  Hepatitis panel -  Tylenol neg -  ASA neg -  US demonstrates fatty liver -  Bilirubin minimally elevated.  Check INR -  No jaundice or severe pain to suggest acute alcoholic hepatitis  Essential hypertension, blood pressures mildly elevated -  Continue bystolic  DM type 2  -  Continue q4h SSI until tolerating PO  HX of UC per patient with chronic diarrhea, not on any normal maintenance medications -  Schedule lomotil BID and add imodium prn  Diet:  FLD Access:  PIV IVF:  yes Proph:  lovenox  Code Status: full Family Communication: patient alone [facility]position Plan: [facility].  Anticipate hallucinations and moderate to severe withdrawal over next 3-4 days.   Consultants:  None today  Procedures:  RUQ [place]  Antibiotics:  none   HPI/Subjective:  Nausea without vomiting.  Not hungry.  Has chronic diarrhea due to UC that is currently just mucousy but not watery.  Denies hallucinations, seizures.      Objective: Filed Vitals:   01/24/16 0449 01/24/16 0500 01/24/16 0515 01/24/16 0556  BP: 155/87 158/86 158/86 158/76  Pulse: 107 101 103 103  Temp:    97.7 F (36.5 C)  TempSrc:    Oral  Resp: 16   16  Height:    5\' 4"  (1.626 m)  Weight:    71.623 kg (157 lb 14.4 oz)  SpO2: 96% 95%  96%    Intake/Output Summary (Last 24 hours) at 01/24/16 0836 Last data filed at 01/24/16 0441  Gross per 24 hour  Intake    1000 ml  Output      0 ml  Net   1000 ml   Filed Weights   01/24/16 0556  Weight: 71.623 kg (157 lb 14.4 oz)   Body mass index is 27.09 kg/(m^2).  Exam:   General:  Adult female, No acute distress, not confused and able to answer questions quickly/precisely  HEENT:  NCAT, MMM, mildly tremulous  Cardiovascular:  Tachycardic RR, nl S1, S2 no mrg, 2+ pulses, warm extremities  Respiratory:  CTAB, no increased WOB  Abdomen:   NABS, soft, NT/ND  MSK:   Normal tone and bulk, no LEE  Neuro:  Grossly intact, fine tremor  Data Reviewed: Basic Metabolic Panel:  Recent Labs Lab 01/23/16 2317  NA 131*  K 3.8  CL 96*  CO2 21*  GLUCOSE 213*  BUN 18  CREATININE 0.51  CALCIUM 7.9*   Liver Function Tests:  Recent Labs Lab 01/23/16 2317  AST 1717*  ALT 505*  ALKPHOS 86  BILITOT 1.8*  PROT 6.7  ALBUMIN 3.9   No results for input(s): LIPASE, AMYLASE in the last 168 hours. No results for input(s): AMMONIA in the last 168 hours. CBC:  Recent Labs Lab 01/23/16 2317  WBC 4.8  NEUTROABS 3.1  HGB 14.2  HCT 41.7  MCV 92.3  PLT 214    No results  found for this or any previous visit (from the past 240 hour(s)).   Studies: US Abdomen Complete  01/24/2016  CLINICAL DATA:  70 year old female with elevated liver function tests. EXAM: ABDOMEN ULTRASOUND COMPLETE COMPARISON:  CT dated 08/28/2014 FINDINGS: Gallbladder: No gallstones or wall thickening visualized. No sonographic Murphy sign noted by sonographer. Common bile duct: Diameter: 4 mm Liver: Diffuse increased echogenicity compatible with fatty infiltration. IVC: No abnormality visualized. Pancreas: Visualized portion unremarkable. Spleen: Size and appearance within normal limits. Right Kidney: Length: 11 cm. Echogenicity within normal limits. No mass or hydronephrosis visualized. Left Kidney: Length: 11 cm. Echogenicity within normal limits. No mass or hydronephrosis visualized. Abdominal aorta: The visualized aorta  appears unremarkable. Other findings:  None IMPRESSION: Diffuse fatty infiltration of the liver otherwise unremarkable abdominal ultrasound. Electronically Signed   By: Anner Crete M.D.   On: 01/24/2016 03:06    Scheduled Meds: . diphenoxylate-atropine  1 tablet Oral BID  . escitalopram  20 mg Oral Daily  . ezetimibe  10 mg Oral Daily  . insulin aspart  0-9 Units Subcutaneous 6 times per day  . LORazepam  0-4 mg Oral Q2H  . nebivolol  5 mg Oral Daily  . thiamine  100 mg Oral Daily   Continuous Infusions: . sodium chloride 75 mL/hr at 01/24/16 0605    Active Problems:   Essential hypertension   EtOH dependence ([place])   DM (diabetes mellitus), type 2, uncontrolled w/ophthalmic complication ([place])   Alcohol intoxication ([place])   Chronic pain syndrome   Elevated LFTs    Time spent: 30 min    Moya Duan, [facility] Pager [phone]. If 7PM-7AM, please contact night-coverage at [email or web address], password [facility] 01/24/2016, 8:36 AM

## 2016-01-24 NOTE — ED Notes (Signed)
Pt tried but was unable to urinate.

## 2016-01-24 NOTE — ED Provider Notes (Signed)
CSN: [id number]     Arrival date & time 01/23/16  2155 History   First MD Initiated Contact with Patient 01/23/16 2205     Chief Complaint  Patient presents with  . Alcohol Intoxication     (Consider location/radiation/quality/duration/timing/severity/associated sxs/prior Treatment) HPI Comments: [name] is a 70 year old female with a history of alcohol abuse, presents to the emergency department tonight intoxicated, complaining of abdominal pain, nausea, vomiting.  She states she wants detox.  She also tells me that she has spoken with [facility] and is planning on entering for a 60 day program after she is medically cleared through the emergency department.  She says she had one episode of sobriety several years ago, but since her son's suicide in 2012.  She's been drinking steadily.  Her beverage of choice is beer and wine.  Denies any street drugs  Patient is a 70 y.o. female presenting with intoxication. The history is provided by the patient.  Alcohol Intoxication This is a chronic problem. The problem occurs constantly. The problem has been unchanged. Associated symptoms include abdominal pain, nausea and vomiting. Pertinent negatives include no chest pain, chills, coughing or fever. Nothing aggravates the symptoms. She has tried nothing for the symptoms. The treatment provided no relief.    Past Medical History  Diagnosis Date  . Alcohol abuse   . Diabetes mellitus   . Hypertension   . Fibromyalgia   . Depression   . Carotid artery stenosis   . Diverticulitis   . Hepatitis 08/28/2014   Past Surgical History  Procedure Laterality Date  . Cesarean section    . Tonsillectomy    . Bunionectomy     Family History  Problem Relation Age of Onset  . Cancer Mother   . Heart disease Father   . Heart disease Sister    Social History  Substance Use Topics  . Smoking status: Former Smoker    Quit date: 03/11/2010  . Smokeless tobacco: Never Used  . Alcohol Use: Yes   Comment: 4-5 bottles of wine per day / 2-3 quarts per day   OB History    No data available     Review of Systems  Constitutional: Negative for fever and chills.  Respiratory: Negative for cough.   Cardiovascular: Negative for chest pain.  Gastrointestinal: Positive for nausea, vomiting and abdominal pain. Negative for diarrhea and constipation.  Genitourinary: Negative for dysuria.  Neurological: Negative for dizziness.  All other systems reviewed and are negative.     Allergies  Latex; Lipitor; Codeine; Pentazocine lactate; and Sulfonamide derivatives  Home Medications   Prior to Admission medications   Medication Sig Start Date End Date Taking? Authorizing Provider  amphetamine-dextroamphetamine (ADDERALL XR) 30 MG 24 hr capsule Take 30 mg by mouth daily.  11/25/10  Yes Historical Provider, MD  b complex vitamins tablet Take 1 tablet by mouth daily.   Yes Historical Provider, MD  BELVIQ XR 20 MG TB24 Take 1 tablet by mouth daily. 12/19/15  Yes Historical Provider, MD  BYSTOLIC 5 MG tablet TAKE 1 TABLET DAILY 09/26/15  Yes Philemon Kingdom, MD  diphenoxylate-atropine (LOMOTIL) 2.5-0.025 MG tablet Take 1 tablet by mouth 4 (four) times daily as needed for diarrhea or loose stools.  07/08/10  Yes Historical Provider, MD  escitalopram (LEXAPRO) 20 MG tablet Take 20 mg by mouth daily.   Yes Historical Provider, MD  ezetimibe (ZETIA) 10 MG tablet Take 1 tablet (10 mg total) by mouth daily. 07/09/14  Yes Philemon Kingdom, MD  glipiZIDE (GLUCOTROL XL) 5 MG 24 hr tablet TAKE 2 TABLETS DAILY WITH BREAKFAST 07/14/15  Yes Philemon Kingdom, MD  glucose blood (BAYER CONTOUR NEXT TEST) test strip Use to test blood sugar 3 times daily as instructed. Dx code: E11.65 02/10/15  Yes Philemon Kingdom, MD  Lancets (ACCU-CHEK MULTICLIX) lancets Use to test blood sugar 3 times daily as instructed. Dx: E11.65 02/10/15  Yes Philemon Kingdom, MD  lisinopril (PRINIVIL,ZESTRIL) 5 MG tablet TAKE 1 TABLET BY MOUTH  EVERY DAY 07/14/15  Yes Philemon Kingdom, MD  zaleplon (SONATA) 10 MG capsule TAKE 1-2 CAPSULES BY MOUTH AT BEDTIME.--REFILL ON 2/22 AND 3/34/17 01/05/16  Yes Historical Provider, MD  chlordiazePOXIDE (LIBRIUM) 25 MG capsule 50mg  PO TID x 1D, then 25-50mg  PO BID X 1D, then 25-50mg  PO QD X 1D Patient not taking: Reported on 01/23/2016 03/15/15   Jola Schmidt, MD  diphenoxylate-atropine (LOMOTIL) 2.5-0.025 MG per tablet Take 1 tablet by mouth 4 (four) times daily as needed for diarrhea or loose stools. Patient not taking: Reported on 03/14/2015 07/19/14   [name], MD  glipiZIDE (GLUCOTROL XL) 5 MG 24 hr tablet Take 1 tablet (5 mg total) by mouth daily with breakfast. Patient not taking: Reported on 03/14/2015 02/10/15   Philemon Kingdom, MD  hydrochlorothiazide (HYDRODIURIL) 12.5 MG tablet Take 12.5 mg by mouth daily.  01/21/16   Historical Provider, MD  hydrOXYzine (VISTARIL) 25 MG capsule TAKE 1-3 CAPSULES BY MOUTH ONCE DAILY AS NEEDED FOR ALLERGIES OR ITCHING 01/12/16   Historical Provider, MD  LORazepam (ATIVAN) 0.5 MG tablet Take 1 tablet (0.5 mg total) by mouth daily as needed for anxiety. Patient not taking: Reported on 03/14/2015 09/16/14   [name], MD  nitrofurantoin, macrocrystal-monohydrate, (MACROBID) 100 MG capsule Take 1 capsule (100 mg total) by mouth 2 (two) times daily. Patient not taking: Reported on 03/14/2015 12/30/14   Hyman Bible, PA-C  ondansetron (ZOFRAN) 8 MG tablet Take 1 tablet (8 mg total) by mouth every 8 (eight) hours as needed for nausea or vomiting. Patient not taking: Reported on 03/14/2015 09/16/14   [name], MD  oxyCODONE 10 MG TABS Take 1 tablet (10 mg total) by mouth every 6 (six) hours as needed for moderate pain. Patient not taking: Reported on 03/14/2015 09/05/14   Caren Griffins, MD  Oxycodone HCl 10 MG TABS Take 1 tablet (10 mg total) by mouth 4 (four) times daily. Patient not taking: Reported on 03/14/2015 09/07/14   Hyman Bible, PA-C  oxyCODONE-acetaminophen  (PERCOCET) 5-325 MG per tablet 1 to 2 tablets every 6 hours as needed for pain. Patient not taking: Reported on 03/14/2015 09/11/14   Harden Mo, MD  promethazine (PHENERGAN) 12.5 MG tablet Take 1 tablet (12.5 mg total) by mouth every 6 (six) hours as needed for nausea or vomiting. Patient not taking: Reported on 03/14/2015 09/05/14   Caren Griffins, MD  promethazine (PHENERGAN) 25 MG tablet Take 1 tablet (25 mg total) by mouth every 6 (six) hours as needed for nausea or vomiting. Patient not taking: Reported on 03/14/2015 09/11/14   Harden Mo, MD  traMADol (ULTRAM) 50 MG tablet Take 1 tablet (50 mg total) by mouth every 8 (eight) hours as needed for moderate pain. Patient not taking: Reported on 03/15/2015 09/16/14   [name], MD   BP 157/87 mmHg  Pulse 95  Temp(Src) 100.3 F (37.9 C) (Oral)  Resp 16  Ht 5\' 4"  (1.626 m)  Wt 71.623 kg  BMI 27.09 kg/m2  SpO2 95% Physical  Exam  Constitutional: She is oriented to person, place, and time. She appears well-developed and well-nourished.  HENT:  Head: Normocephalic.  Eyes: Pupils are equal, round, and reactive to light.  Neck: Normal range of motion.  Cardiovascular: Normal rate and regular rhythm.   Pulmonary/Chest: Breath sounds normal. She is in respiratory distress.  Abdominal: Soft. She exhibits no distension. There is tenderness in the right upper quadrant and epigastric area.  Musculoskeletal: Normal range of motion.  Neurological: She is alert and oriented to person, place, and time.  Skin: Skin is warm and dry. There is pallor.  Nursing note and vitals reviewed.   ED Course  Procedures (including critical care time) Labs Review Labs Reviewed  ETHANOL - Abnormal; Notable for the following:    Alcohol, Ethyl (B) 335 (*)    All other components within normal limits  COMPREHENSIVE METABOLIC PANEL - Abnormal; Notable for the following:    Sodium 131 (*)    Chloride 96 (*)    CO2 21 (*)    Glucose, Bld 213 (*)    Calcium  7.9 (*)    AST 1717 (*)    ALT 505 (*)    Total Bilirubin 1.8 (*)    All other components within normal limits  ACETAMINOPHEN LEVEL - Abnormal; Notable for the following:    Acetaminophen (Tylenol), Serum <10 (*)    All other components within normal limits  GLUCOSE, CAPILLARY - Abnormal; Notable for the following:    Glucose-Capillary 143 (*)    All other components within normal limits  PROTIME-INR - Abnormal; Notable for the following:    Prothrombin Time 17.3 (*)    All other components within normal limits  GLUCOSE, CAPILLARY - Abnormal; Notable for the following:    Glucose-Capillary 134 (*)    All other components within normal limits  GLUCOSE, CAPILLARY - Abnormal; Notable for the following:    Glucose-Capillary 124 (*)    All other components within normal limits  GLUCOSE, CAPILLARY - Abnormal; Notable for the following:    Glucose-Capillary 114 (*)    All other components within normal limits  URINE RAPID DRUG SCREEN, HOSP PERFORMED  CBC WITH DIFFERENTIAL/PLATELET  SALICYLATE LEVEL  APTT  HEPATITIS PANEL, ACUTE  HEMOGLOBIN A1C  COMPREHENSIVE METABOLIC PANEL  MAGNESIUM  CBC  URINALYSIS, ROUTINE W REFLEX MICROSCOPIC (NOT AT [facility])    Imaging Review US Abdomen Complete  01/24/2016  CLINICAL DATA:  70 year old female with elevated liver function tests. EXAM: ABDOMEN ULTRASOUND COMPLETE COMPARISON:  CT dated 08/28/2014 FINDINGS: Gallbladder: No gallstones or wall thickening visualized. No sonographic Murphy sign noted by sonographer. Common bile duct: Diameter: 4 mm Liver: Diffuse increased echogenicity compatible with fatty infiltration. IVC: No abnormality visualized. Pancreas: Visualized portion unremarkable. Spleen: Size and appearance within normal limits. Right Kidney: Length: 11 cm. Echogenicity within normal limits. No mass or hydronephrosis visualized. Left Kidney: Length: 11 cm. Echogenicity within normal limits. No mass or hydronephrosis visualized. Abdominal  aorta: The visualized aorta appears unremarkable. Other findings:  None IMPRESSION: Diffuse fatty infiltration of the liver otherwise unremarkable abdominal ultrasound. Electronically Signed   By: Anner Crete M.D.   On: 01/24/2016 03:06   Dg Chest Port 1 View  01/24/2016  CLINICAL DATA:  Fever of unknown origin EXAM: PORTABLE CHEST 1 VIEW COMPARISON:  08/28/2014 FINDINGS: The heart size and mediastinal contours are within normal limits. Both lungs are clear. The visualized skeletal structures are unremarkable. IMPRESSION: No active disease. Electronically Signed   By: Linus Mako.D.  On: 01/24/2016 19:30   I have personally reviewed and evaluated these images and lab results as part of my medical decision-making.   EKG Interpretation None      MDM   Final diagnoses:  Alcohol intoxication, with unspecified complication ([place])  Elevated LFTs  Hyperbilirubinemia         Junius Creamer, NP 01/24/16 [place], MD 01/27/16 1402

## 2016-01-24 NOTE — Progress Notes (Signed)
Rx Brief note  Rx d/c'd Locaserin (Belviq) from [facility] this medication is only indicated for weight management therefore will not be given while inpatient.  Thanks Dorrene German 01/24/2016 6:08 AM

## 2016-01-24 NOTE — ED Notes (Signed)
Bed: WA04 Expected date:  Expected time:  Means of arrival:  Comments: 

## 2016-01-25 DIAGNOSIS — N390 Urinary tract infection, site not specified: Secondary | ICD-10-CM

## 2016-01-25 DIAGNOSIS — E876 Hypokalemia: Secondary | ICD-10-CM

## 2016-01-25 DIAGNOSIS — F10230 Alcohol dependence with withdrawal, uncomplicated: Secondary | ICD-10-CM

## 2016-01-25 LAB — GLUCOSE, CAPILLARY
Glucose-Capillary: 110 mg/dL — ABNORMAL HIGH (ref 65–99)
Glucose-Capillary: 114 mg/dL — ABNORMAL HIGH (ref 65–99)
Glucose-Capillary: 127 mg/dL — ABNORMAL HIGH (ref 65–99)
Glucose-Capillary: 80 mg/dL (ref 65–99)
Glucose-Capillary: 83 mg/dL (ref 65–99)
Glucose-Capillary: 86 mg/dL (ref 65–99)

## 2016-01-25 LAB — COMPREHENSIVE METABOLIC PANEL
ALT: 530 U/L — ABNORMAL HIGH (ref 14–54)
AST: 1037 U/L — ABNORMAL HIGH (ref 15–41)
Albumin: 3.2 g/dL — ABNORMAL LOW (ref 3.5–5.0)
Alkaline Phosphatase: 120 U/L (ref 38–126)
Anion gap: 8 (ref 5–15)
BUN: 17 mg/dL (ref 6–20)
CO2: 23 mmol/L (ref 22–32)
Calcium: 7.4 mg/dL — ABNORMAL LOW (ref 8.9–10.3)
Chloride: 104 mmol/L (ref 101–111)
Creatinine, Ser: 0.78 mg/dL (ref 0.44–1.00)
GFR calc Af Amer: >60 mL/min (ref >60)
GFR calc non Af Amer: >60 mL/min (ref >60)
Glucose, Bld: 85 mg/dL (ref 65–99)
Potassium: 3.2 mmol/L — ABNORMAL LOW (ref 3.5–5.1)
Sodium: 135 mmol/L (ref 135–145)
Total Bilirubin: 2.1 mg/dL — ABNORMAL HIGH (ref 0.3–1.2)
Total Protein: 5.8 g/dL — ABNORMAL LOW (ref 6.5–8.1)

## 2016-01-25 LAB — URINE MICROSCOPIC-ADD ON

## 2016-01-25 LAB — CBC
HCT: 36.6 % (ref 36.0–46.0)
Hemoglobin: 12.8 g/dL (ref 12.0–15.0)
MCH: 32.2 pg (ref 26.0–34.0)
MCHC: 35 g/dL (ref 30.0–36.0)
MCV: 92.2 fL (ref 78.0–100.0)
Platelets: 119 10*3/uL — ABNORMAL LOW (ref 150–400)
RBC: 3.97 MIL/uL (ref 3.87–5.11)
RDW: 14.1 % (ref 11.5–15.5)
WBC: 2.8 10*3/uL — ABNORMAL LOW (ref 4.0–10.5)

## 2016-01-25 LAB — URINALYSIS, ROUTINE W REFLEX MICROSCOPIC
Bilirubin Urine: NEGATIVE
Glucose, UA: NEGATIVE mg/dL
Ketones, ur: NEGATIVE mg/dL
Nitrite: NEGATIVE
Protein, ur: NEGATIVE mg/dL
Specific Gravity, Urine: 1.013 (ref 1.005–1.030)
pH: 7 (ref 5.0–8.0)

## 2016-01-25 LAB — HEPATITIS PANEL, ACUTE
HCV Ab: <0.1 s/co ratio (ref 0.0–0.9)
Hep A IgM: NEGATIVE
Hep B C IgM: NEGATIVE
Hepatitis B Surface Ag: NEGATIVE

## 2016-01-25 LAB — MAGNESIUM: Magnesium: 1.6 mg/dL — ABNORMAL LOW (ref 1.7–2.4)

## 2016-01-25 MED ORDER — SODIUM CHLORIDE 0.9 % IV SOLN
2.0000 g | Freq: Once | INTRAVENOUS | Status: AC
  Administered 2016-01-25: 2 g via INTRAVENOUS
  Filled 2016-01-25: qty 20

## 2016-01-25 MED ORDER — METOPROLOL TARTRATE 1 MG/ML IV SOLN: 5.0000 mg | Freq: Four times a day (QID) | INTRAVENOUS | Status: DC | PRN

## 2016-01-25 MED ORDER — MAGNESIUM CHLORIDE 64 MG PO TBEC
1.0000 | DELAYED_RELEASE_TABLET | Freq: Two times a day (BID) | ORAL | Status: DC
  Administered 2016-01-25 – 2016-01-27 (×5): 64 mg via ORAL
  Filled 2016-01-25 (×6): qty 1

## 2016-01-25 MED ORDER — CALCIUM CARBONATE ANTACID 500 MG PO CHEW
2.0000 | CHEWABLE_TABLET | Freq: Three times a day (TID) | ORAL | Status: DC
  Administered 2016-01-25 – 2016-01-28 (×8): 400 mg via ORAL
  Filled 2016-01-25 (×8): qty 2

## 2016-01-25 MED ORDER — OXYCODONE HCL 5 MG PO TABS: 5.0000 mg | ORAL_TABLET | ORAL | Status: DC | PRN

## 2016-01-25 MED ORDER — POTASSIUM CHLORIDE CRYS ER 20 MEQ PO TBCR
40.0000 meq | EXTENDED_RELEASE_TABLET | Freq: Every day | ORAL | Status: DC
  Administered 2016-01-25 – 2016-01-28 (×4): 40 meq via ORAL
  Filled 2016-01-25 (×4): qty 2

## 2016-01-25 MED ORDER — FOLIC ACID 1 MG PO TABS
1.0000 mg | ORAL_TABLET | Freq: Every day | ORAL | Status: DC
  Administered 2016-01-25 – 2016-01-28 (×4): 1 mg via ORAL
  Filled 2016-01-25 (×4): qty 1

## 2016-01-25 MED ORDER — MAGNESIUM SULFATE 4 GM/100ML IV SOLN
4.0000 g | Freq: Once | INTRAVENOUS | Status: AC
  Administered 2016-01-25: 4 g via INTRAVENOUS
  Filled 2016-01-25: qty 100

## 2016-01-25 MED ORDER — INSULIN ASPART 100 UNIT/ML ~~LOC~~ SOLN: 0.0000 [IU] | Freq: Three times a day (TID) | SUBCUTANEOUS | Status: DC

## 2016-01-25 MED ORDER — PROMETHAZINE HCL 25 MG/ML IJ SOLN: 6.2500 mg | Freq: Four times a day (QID) | INTRAMUSCULAR | Status: DC | PRN

## 2016-01-25 MED ORDER — ADULT MULTIVITAMIN W/MINERALS CH
1.0000 | ORAL_TABLET | Freq: Every day | ORAL | Status: DC
  Administered 2016-01-25 – 2016-01-28 (×4): 1 via ORAL
  Filled 2016-01-25 (×4): qty 1

## 2016-01-25 MED ORDER — DEXTROSE 5 % IV SOLN
1.0000 g | INTRAVENOUS | Status: DC
  Administered 2016-01-25 – 2016-01-26 (×2): 1 g via INTRAVENOUS
  Filled 2016-01-25 (×4): qty 10

## 2016-01-25 NOTE — Plan of Care (Signed)
Problem: Safety: Goal: Ability to remain free from injury will improve Outcome: Progressing Seizure precautions in place.

## 2016-01-25 NOTE — Progress Notes (Addendum)
[facility] PROGRESS NOTE  [name] [id number] DOB: 22-Apr-1946 DOA: 01/23/2016 PCP: [name], MD  Brief Summary  The patient is a 70 year old female with a history of alcohol dependence, hypertension, diabetes mellitus type 2 who presented to the emergency department for alcohol detoxification. She been trying to cut back on her alcohol consumption as an outpatient, trying to detox herself, but had symptoms of withdrawal so she came to the emergency department. In the emergency department, she had an alcohol level in the 300s but was feeling shaky and agitated. She was started on CIWA protocol given IV Ativan. She had a transaminitis with an AST of 1717 and an ALT of 505. Abdominal ultrasound demonstrated no evidence of cholelithiasis but did demonstrate fatty liver changes consistent with her alcohol abuse.  Assessment/Plan  Alcohol withdrawal, hx of hallucinations but not seizures and has never needed ICU stay.  Received 14mg  of ativan yesterday with CIWA scores up to 12 on day 1.  At risk for worsening withdrawal -  Continue CIWA q2h  -  Continue MVI, thiamine, folate -  [facility] -  Continue zofran and add phenergan prn -  Advance to regular diet  Transaminitis due to EtOH abuse, stable to improved today -  Hepatitis panel negative -  Tylenol neg -  ASA neg -  US demonstrates fatty liver -  Bilirubin minimally elevated.  INR 1.46 -  No jaundice or severe pain to suggest acute alcoholic hepatitis  Urinary tract infection with low-grade fever, present at time of admission, NOT associated with UTI -  Follow-up urine culture -  Start ceftriaxone  Essential hypertension, blood pressures mildly elevated -  Continue bystolic -  Add when necessary metoprolol  DM type 2  -  Changed to mealtime insulin now that eating  HX of UC per patient with chronic diarrhea, not on any normal maintenance medications.  Still having  frequent stools -  Continue scheduled lomotil BID and add imodium prn  Hypokalemia, hypocalcemia, and hypomagnesemia, secondary to poor nutrition -  Magnesium 4gm IV once and start daily oral supplementation.   -  Calcium gluconate 2gm IV once and start daily oral supplementation.  -  Start daily oral potassium supplementation.   -  Repeat BMP in AM  -  Check phos with AML  Diet:  diabetic Access:  PIV IVF:  yes Proph:  lovenox  Code Status: full Family Communication: patient alone [facility]position Plan: [facility] few days.  Needs to have CIWA scores of 10 or less for 24 hours before discharge.  Consultants:  None today  Procedures:  RUQ [place]  Antibiotics:  none   HPI/Subjective:  Nausea improving and she was to eat a regular meal today. Denies abdominal pain, shortness of breath, chest pain. She has persistent shakes/tremor, but denies hallucinations or seizures.  Objective: Filed Vitals:   01/25/16 0150 01/25/16 0555 01/25/16 0721 01/25/16 1100  BP: 133/72 148/65 166/86 153/78  Pulse: 66 67 70 92  Temp: 98.1 F (36.7 C) 99 F (37.2 C)  98.7 F (37.1 C)  TempSrc: Axillary Oral  Oral  Resp: 16   16  Height:      Weight:      SpO2: 95% 96%  98%    Intake/Output Summary (Last 24 hours) at 01/25/16 1259 Last data filed at 01/25/16 1100  Gross per 24 hour  Intake      0 ml  Output   1200  ml  Net  -1200 ml   Filed Weights   01/24/16 0556  Weight: 71.623 kg (157 lb 14.4 oz)   Body mass index is 27.09 kg/(m^2).  Exam:   General:  Adult female, No acute distress, not confused and able to answer questions quickly/precisely  HEENT:  NCAT, MMM, mildly tremulous  Cardiovascular:  Tachycardic RR, nl S1, S2 no mrg, 2+ pulses, warm extremities  Respiratory:  CTAB, no increased WOB  Abdomen:   NABS, soft, NT/ND  MSK:   Normal tone and bulk, no LEE  Neuro:  Grossly intact, fine tremor  Data Reviewed: Basic Metabolic Panel:  Recent Labs Lab  01/23/16 2317 01/25/16 0805  NA 131* 135  K 3.8 3.2*  CL 96* 104  CO2 21* 23  GLUCOSE 213* 85  BUN 18 17  CREATININE 0.51 0.78  CALCIUM 7.9* 7.4*  MG  --  1.6*   Liver Function Tests:  Recent Labs Lab 01/23/16 2317 01/25/16 0805  AST 1717* 1037*  ALT 505* 530*  ALKPHOS 86 120  BILITOT 1.8* 2.1*  PROT 6.7 5.8*  ALBUMIN 3.9 3.2*   No results for input(s): LIPASE, AMYLASE in the last 168 hours. No results for input(s): AMMONIA in the last 168 hours. CBC:  Recent Labs Lab 01/23/16 2317 01/25/16 0805  WBC 4.8 2.8*  NEUTROABS 3.1  --   HGB 14.2 12.8  HCT 41.7 36.6  MCV 92.3 92.2  PLT 214 119*    No results found for this or any previous visit (from the past 240 hour(s)).   Studies: US Abdomen Complete  01/24/2016  CLINICAL DATA:  70 year old female with elevated liver function tests. EXAM: ABDOMEN ULTRASOUND COMPLETE COMPARISON:  CT dated 08/28/2014 FINDINGS: Gallbladder: No gallstones or wall thickening visualized. No sonographic Murphy sign noted by sonographer. Common bile duct: Diameter: 4 mm Liver: Diffuse increased echogenicity compatible with fatty infiltration. IVC: No abnormality visualized. Pancreas: Visualized portion unremarkable. Spleen: Size and appearance within normal limits. Right Kidney: Length: 11 cm. Echogenicity within normal limits. No mass or hydronephrosis visualized. Left Kidney: Length: 11 cm. Echogenicity within normal limits. No mass or hydronephrosis visualized. Abdominal aorta: The visualized aorta appears unremarkable. Other findings:  None IMPRESSION: Diffuse fatty infiltration of the liver otherwise unremarkable abdominal ultrasound. Electronically Signed   By: Anner Crete M.D.   On: 01/24/2016 03:06   Dg Chest Port 1 View  01/24/2016  CLINICAL DATA:  Fever of unknown origin EXAM: PORTABLE CHEST 1 VIEW COMPARISON:  08/28/2014 FINDINGS: The heart size and mediastinal contours are within normal limits. Both lungs are clear. The visualized  skeletal structures are unremarkable. IMPRESSION: No active disease. Electronically Signed   By: Inez Catalina M.D.   On: 01/24/2016 19:30    Scheduled Meds: . cefTRIAXone (ROCEPHIN)  IV  1 g Intravenous Q24H  . diphenoxylate-atropine  1 tablet Oral BID  . escitalopram  20 mg Oral Daily  . ezetimibe  10 mg Oral Daily  . insulin aspart  0-9 Units Subcutaneous 6 times per day  . LORazepam  0-4 mg Oral Q2H  . nebivolol  5 mg Oral Daily  . thiamine  100 mg Oral Daily   Continuous Infusions: . sodium chloride 75 mL/hr at 01/25/16 0201    Active Problems:   Essential hypertension   EtOH dependence ([place])   DM (diabetes mellitus), type 2, uncontrolled w/ophthalmic complication ([facility])   Alcohol intoxication ([place])   Chronic pain syndrome   Elevated LFTs    Time spent: 30 min  Janece Canterbury  [facility] Pager [phone]. If 7PM-7AM, please contact night-coverage at [email or web address], password [facility] 01/25/2016, 12:59 PM  LOS: 1 day

## 2016-01-26 DIAGNOSIS — F10231 Alcohol dependence with withdrawal delirium: Secondary | ICD-10-CM

## 2016-01-26 LAB — CBC
HCT: 38.2 % (ref 36.0–46.0)
Hemoglobin: 12.7 g/dL (ref 12.0–15.0)
MCH: 31.5 pg (ref 26.0–34.0)
MCHC: 33.2 g/dL (ref 30.0–36.0)
MCV: 94.8 fL (ref 78.0–100.0)
Platelets: 127 10*3/uL — ABNORMAL LOW (ref 150–400)
RBC: 4.03 MIL/uL (ref 3.87–5.11)
RDW: 14.4 % (ref 11.5–15.5)
WBC: 3.2 10*3/uL — ABNORMAL LOW (ref 4.0–10.5)

## 2016-01-26 LAB — COMPREHENSIVE METABOLIC PANEL
ALT: 368 U/L — ABNORMAL HIGH (ref 14–54)
AST: 396 U/L — ABNORMAL HIGH (ref 15–41)
Albumin: 3.3 g/dL — ABNORMAL LOW (ref 3.5–5.0)
Alkaline Phosphatase: 137 U/L — ABNORMAL HIGH (ref 38–126)
Anion gap: 12 (ref 5–15)
BUN: 19 mg/dL (ref 6–20)
CO2: 21 mmol/L — ABNORMAL LOW (ref 22–32)
Calcium: 8.1 mg/dL — ABNORMAL LOW (ref 8.9–10.3)
Chloride: 104 mmol/L (ref 101–111)
Creatinine, Ser: 0.67 mg/dL (ref 0.44–1.00)
GFR calc Af Amer: >60 mL/min (ref >60)
GFR calc non Af Amer: >60 mL/min (ref >60)
Glucose, Bld: 132 mg/dL — ABNORMAL HIGH (ref 65–99)
Potassium: 3.7 mmol/L (ref 3.5–5.1)
Sodium: 137 mmol/L (ref 135–145)
Total Bilirubin: 0.6 mg/dL (ref 0.3–1.2)
Total Protein: 6.2 g/dL — ABNORMAL LOW (ref 6.5–8.1)

## 2016-01-26 LAB — GLUCOSE, CAPILLARY
Glucose-Capillary: 117 mg/dL — ABNORMAL HIGH (ref 65–99)
Glucose-Capillary: 113 mg/dL — ABNORMAL HIGH (ref 65–99)
Glucose-Capillary: 137 mg/dL — ABNORMAL HIGH (ref 65–99)

## 2016-01-26 LAB — HEMOGLOBIN A1C
Hgb A1c MFr Bld: 8.1 % — ABNORMAL HIGH (ref 4.8–5.6)
Mean Plasma Glucose: 186 mg/dL

## 2016-01-26 LAB — MAGNESIUM: Magnesium: 2 mg/dL (ref 1.7–2.4)

## 2016-01-26 LAB — PHOSPHORUS: Phosphorus: 2.2 mg/dL — ABNORMAL LOW (ref 2.5–4.6)

## 2016-01-26 MED ORDER — POTASSIUM PHOSPHATE MONOBASIC 500 MG PO TABS
500.0000 mg | ORAL_TABLET | Freq: Three times a day (TID) | ORAL | Status: DC
Start: 2016-01-26 — End: 2016-01-28
  Administered 2016-01-26 – 2016-01-28 (×7): 500 mg via ORAL
  Filled 2016-01-26 (×12): qty 1

## 2016-01-26 MED ORDER — SODIUM CHLORIDE 0.9 % IV SOLN
1.0000 g | Freq: Once | INTRAVENOUS | Status: AC
  Administered 2016-01-26: 1 g via INTRAVENOUS
  Filled 2016-01-26: qty 10

## 2016-01-26 NOTE — Progress Notes (Signed)
[facility] PROGRESS NOTE  [name] [id number] DOB: 08-05-1946 DOA: 01/23/2016 PCP: [name], MD  Brief Summary  The patient is a 70 year old female with a history of alcohol dependence, hypertension, diabetes mellitus type 2 who presented to the emergency department for alcohol detoxification. She been trying to cut back on her alcohol consumption as an outpatient, trying to detox herself, but had symptoms of withdrawal so she came to the emergency department. In the emergency department, she had an alcohol level in the 300s but was feeling shaky and agitated. She was started on CIWA protocol given IV Ativan. She had a transaminitis with an AST of 1717 and an ALT of 505. Abdominal ultrasound demonstrated no evidence of cholelithiasis but did demonstrate fatty liver changes consistent with her alcohol abuse.  Assessment/Plan  Alcohol withdrawal, hx of hallucinations but not seizures and has never needed ICU stay.  Received 12mg  of ativan yesterday with CIWA scores up to 16.  At risk for worsening withdrawal -  Continue CIWA q2h  -  Continue MVI, thiamine, folate -  [facility] -  Continue zofran and add phenergan prn -  Advance to regular diet  Transaminitis due to EtOH abuse, continues to improve -  Hepatitis panel negative -  Tylenol neg -  ASA neg -  US demonstrates fatty liver -  Bilirubin minimally elevated.  INR 1.46 -  No jaundice or severe pain to suggest acute alcoholic hepatitis  Urinary tract infection with low-grade fever, present at time of admission, NOT associated with UTI -  Follow-up urine culture -  Ceftriaxone day 2  Essential hypertension, blood pressures mildly elevated -  Continue bystolic -  Add when necessary metoprolol  DM type 2, CBGs low normal, discontinue sliding scale insulin   HX of UC per patient with chronic diarrhea, not on any normal maintenance medications.  Still having frequent  stools -  Continue scheduled lomotil BID and add imodium prn  Hypokalemia, hypocalcemia, hypophosphatemia, and hypomagnesemia, secondary to poor nutrition -  Magnesium 4gm IV once and start daily oral supplementation.   -  Calcium gluconate 2gm IV once and start daily oral supplementation.  -  Continue daily oral potassium supplementation.   -  Repeat BMP in AM  -  Check phos with AML  Diet:  diabetic Access:  PIV IVF:  yes Proph:  lovenox  Code Status: full Family Communication: patient alone [facility]  Needs to have CIWA scores of 10 or less for 24 hours before discharge.  Consultants:  None today  Procedures:  RUQ [place]  Antibiotics:  none   HPI/Subjective:  Confused this morning. Stated that she had a low blood sugar overnight but this was inaccurate. States she feels very shaky but denies hallucinations. No seizures. Once regular food  Objective: Filed Vitals:   01/26/16 0200 01/26/16 0458 01/26/16 1043 01/26/16 1238  BP: 151/79 149/84 144/61 128/68  Pulse: 80 88 79 62  Temp: 98.5 F (36.9 C) 98.1 F (36.7 C) 98.9 F (37.2 C) 99.1 F (37.3 C)  TempSrc: Oral Oral Oral Oral  Resp: 16 20 18 18   Height:      Weight:      SpO2: 99% 97% 98% 99%    Intake/Output Summary (Last 24 hours) at 01/26/16 1241 Last data filed at 01/26/16 0900  Gross per 24 hour  Intake    480 ml  Output    300 ml  Net    180 ml   Filed Weights   01/24/16 0556  Weight: 71.623 kg (157 lb 14.4 oz)   Body mass index is 27.09 kg/(m^2).  Exam:   General:  Adult female, No acute distress,confused, tremulous   HEENT:  NCAT, MMM,positive tongue fasciculations   Cardiovascular:  Tachycardic RR, nl S1, S2 no mrg, 2+ pulses, warm extremities  Respiratory:  CTAB, no increased WOB  Abdomen:   NABS, soft, NT/ND  MSK:   Normal tone and bulk, no LEE Neuro:  Grossly intact,tremulous   Data Reviewed: Basic Metabolic Panel:  Recent Labs Lab  01/23/16 2317 01/25/16 0805 01/26/16 0458  NA 131* 135 137  K 3.8 3.2* 3.7  CL 96* 104 104  CO2 21* 23 21*  GLUCOSE 213* 85 132*  BUN 18 17 19   CREATININE 0.51 0.78 0.67  CALCIUM 7.9* 7.4* 8.1*  MG  --  1.6* 2.0  PHOS  --   --  2.2*   Liver Function Tests:  Recent Labs Lab 01/23/16 2317 01/25/16 0805 01/26/16 0458  AST 1717* 1037* 396*  ALT 505* 530* 368*  ALKPHOS 86 120 137*  BILITOT 1.8* 2.1* 0.6  PROT 6.7 5.8* 6.2*  ALBUMIN 3.9 3.2* 3.3*   No results for input(s): LIPASE, AMYLASE in the last 168 hours. No results for input(s): AMMONIA in the last 168 hours. CBC:  Recent Labs Lab 01/23/16 2317 01/25/16 0805 01/26/16 0458  WBC 4.8 2.8* 3.2*  NEUTROABS 3.1  --   --   HGB 14.2 12.8 12.7  HCT 41.7 36.6 38.2  MCV 92.3 92.2 94.8  PLT 214 119* 127*    Recent Results (from the past 240 hour(s))  Culture, Urine     Status: None (Preliminary result)   Collection Time: 01/25/16  3:04 AM  Result Value Ref Range Status   Specimen Description URINE, RANDOM  Final   Special Requests NONE  Final   Culture   Final    TOO YOUNG TO READ Performed at [facility]    Report Status PENDING  Incomplete     Studies: Dg Chest Port 1 View  01/24/2016  CLINICAL DATA:  Fever of unknown origin EXAM: PORTABLE CHEST 1 VIEW COMPARISON:  08/28/2014 FINDINGS: The heart size and mediastinal contours are within normal limits. Both lungs are clear. The visualized skeletal structures are unremarkable. IMPRESSION: No active disease. Electronically Signed   By: Inez Catalina M.D.   On: 01/24/2016 19:30    Scheduled Meds: . calcium carbonate  2 tablet Oral TID  . cefTRIAXone (ROCEPHIN)  IV  1 g Intravenous Q24H  . diphenoxylate-atropine  1 tablet Oral BID  . escitalopram  20 mg Oral Daily  . folic acid  1 mg Oral Daily  . LORazepam  0-4 mg Oral Q2H  . magnesium chloride  1 tablet Oral BID  . multivitamin with minerals  1 tablet Oral Daily  . nebivolol  5 mg Oral Daily  .  potassium chloride  40 mEq Oral Daily  . thiamine  100 mg Oral Daily   Continuous Infusions:    Active Problems:   Essential hypertension   EtOH dependence ([place])   DM (diabetes mellitus), type 2, uncontrolled w/ophthalmic complication ([place])   Alcohol intoxication ([place])   Chronic pain syndrome   Elevated LFTs   UTI (urinary tract infection)    Time spent: 30 min    Lexany Belknap, [place] [facility] Pager [phone]. If 7PM-7AM, please contact night-coverage at [email or web address], password [email or web address]  01/26/2016, 12:41 PM  LOS: 2 days

## 2016-01-27 LAB — MAGNESIUM: Magnesium: 1.5 mg/dL — ABNORMAL LOW (ref 1.7–2.4)

## 2016-01-27 LAB — URINE CULTURE: Culture: >=100000

## 2016-01-27 LAB — CBC
HCT: 38.3 % (ref 36.0–46.0)
Hemoglobin: 13 g/dL (ref 12.0–15.0)
MCH: 31.3 pg (ref 26.0–34.0)
MCHC: 33.9 g/dL (ref 30.0–36.0)
MCV: 92.3 fL (ref 78.0–100.0)
Platelets: 112 10*3/uL — ABNORMAL LOW (ref 150–400)
RBC: 4.15 MIL/uL (ref 3.87–5.11)
RDW: 14 % (ref 11.5–15.5)
WBC: 4.3 10*3/uL (ref 4.0–10.5)

## 2016-01-27 LAB — COMPREHENSIVE METABOLIC PANEL
ALT: 250 U/L — ABNORMAL HIGH (ref 14–54)
AST: 137 U/L — ABNORMAL HIGH (ref 15–41)
Albumin: 3.6 g/dL (ref 3.5–5.0)
Alkaline Phosphatase: 113 U/L (ref 38–126)
Anion gap: 9 (ref 5–15)
BUN: 11 mg/dL (ref 6–20)
CO2: 27 mmol/L (ref 22–32)
Calcium: 8.7 mg/dL — ABNORMAL LOW (ref 8.9–10.3)
Chloride: 99 mmol/L — ABNORMAL LOW (ref 101–111)
Creatinine, Ser: 0.59 mg/dL (ref 0.44–1.00)
GFR calc Af Amer: >60 mL/min (ref >60)
GFR calc non Af Amer: >60 mL/min (ref >60)
Glucose, Bld: 117 mg/dL — ABNORMAL HIGH (ref 65–99)
Potassium: 4.3 mmol/L (ref 3.5–5.1)
Sodium: 135 mmol/L (ref 135–145)
Total Bilirubin: 1.1 mg/dL (ref 0.3–1.2)
Total Protein: 6.7 g/dL (ref 6.5–8.1)

## 2016-01-27 LAB — GLUCOSE, CAPILLARY
Glucose-Capillary: 115 mg/dL — ABNORMAL HIGH (ref 65–99)
Glucose-Capillary: 163 mg/dL — ABNORMAL HIGH (ref 65–99)
Glucose-Capillary: 156 mg/dL — ABNORMAL HIGH (ref 65–99)
Glucose-Capillary: 155 mg/dL — ABNORMAL HIGH (ref 65–99)

## 2016-01-27 LAB — PHOSPHORUS: Phosphorus: 3.3 mg/dL (ref 2.5–4.6)

## 2016-01-27 MED ORDER — LISINOPRIL 10 MG PO TABS
5.0000 mg | ORAL_TABLET | Freq: Every day | ORAL | Status: DC
  Administered 2016-01-27 – 2016-01-28 (×2): 5 mg via ORAL
  Filled 2016-01-27 (×2): qty 1

## 2016-01-27 MED ORDER — CEPHALEXIN 500 MG PO CAPS
500.0000 mg | ORAL_CAPSULE | Freq: Two times a day (BID) | ORAL | Status: DC
  Administered 2016-01-27 – 2016-01-28 (×3): 500 mg via ORAL
  Filled 2016-01-27 (×3): qty 1

## 2016-01-27 MED ORDER — HYDROCHLOROTHIAZIDE 12.5 MG PO CAPS
12.5000 mg | ORAL_CAPSULE | Freq: Every day | ORAL | Status: DC
Start: 2016-01-27 — End: 2016-01-28
  Administered 2016-01-27 – 2016-01-28 (×2): 12.5 mg via ORAL
  Filled 2016-01-27 (×2): qty 1

## 2016-01-27 MED ORDER — MAGNESIUM CHLORIDE 64 MG PO TBEC
2.0000 | DELAYED_RELEASE_TABLET | Freq: Two times a day (BID) | ORAL | Status: DC
  Administered 2016-01-27 – 2016-01-28 (×2): 128 mg via ORAL
  Filled 2016-01-27 (×3): qty 2

## 2016-01-27 MED ORDER — PROMETHAZINE HCL 12.5 MG PO TABS
6.2500 mg | ORAL_TABLET | Freq: Four times a day (QID) | ORAL | Status: DC | PRN
  Filled 2016-01-27: qty 1

## 2016-01-27 NOTE — Progress Notes (Signed)
Pt ambulated in hallway and made a complete lap around 3W. No complaints or signs of duress. Pt had a steady gait while ambulating. She'd like to walk again later this afternoon.

## 2016-01-27 NOTE — Clinical Social Work Note (Signed)
Clinical Social Work Assessment  Patient Details  Name: [name] MRN: [id number] Date of Birth: 06-29-1946  Date of referral:  01/27/16               Reason for consult:  Discharge Planning, Substance Use/ETOH Abuse                Permission sought to share information with:  Family Supports Permission granted to share information::     Name::        Agency::     Relationship::     Contact Information:     Housing/Transportation Living arrangements for the past 2 months:  Single Family Home Source of Information:  Patient Patient Interpreter Needed:  None Criminal Activity/Legal Involvement Pertinent to Current Situation/Hospitalization:  No - Comment as needed Significant Relationships:  Spouse Lives with:  Spouse Do you feel safe going back to the place where you live?   (pt wants to go home to pack for [name]) Need for family participation in patient care:  No (Coment)  Care giving concerns:  Pt admitted from home. Per chart, pt planning for admission to [name] for residential substance abuse treatment.    Social Worker assessment / plan:   CSW following for possible placement into [facility] upon discharge.   CSW spoke with pt who is agreeable to CSW discussing with [name] regarding plan. Pt reports that she wants to go home and pack for her stay at [facility] before admitting from [facility]. CSW discussed that if [name] can accept then discussion will have to be with [name] if pt can go home before admission. Pt very adamant that she will have to go home first. CSW discussed that [name] has to review pt medical information before facility will accept pt and won't review information until pt medically stable from hospital MD stand point. CSW discussed with pt that CSW will send that information to [facility] when pt ready for discharge in order to work with pt regarding plan.   CSW spoke with  [name] who is familiar with pt. [facility] that pt has to have completed withdrawal process, not on any opiates or benzos, fully ambulatory, and medically stable before [name] will review information to determine if they can accept pt. CSW notified MD of this.   CSW update pt and will send [name] information when pt is medically stable for [name] to review and notify CSW if they can accept.   CSW to continue to follow.  Employment status:  Retired Forensic scientist:  Managed Care PT Recommendations:  Not assessed at this time Information / Referral to community resources:  Residential Substance Abuse Treatment Options  Patient/Family's Response to care:  Pt alert and oriented x 4. Pt is agreeable to [name], but reports wanting to go home before admitting to [facility]. CSW discussed that CSW will have to discuss with [name] if facility can accept pt.  Patient/Family's Understanding of and Emotional Response to Diagnosis, Current Treatment, and Prognosis:  Pt displayed understanding surrounding medical condition.   Emotional Assessment Appearance:  Appears stated age Attitude/Demeanor/Rapport:  Other (appropriate) Affect (typically observed):  Appropriate Orientation:  Oriented to Self, Oriented to Place, Oriented to  Time, Oriented to Situation Alcohol / Substance use:  Not Applicable Psych involvement (Current and /or in the community):  No (Comment)  Discharge Needs  Concerns to be addressed:  Discharge Planning Concerns Readmission within  the last 30 days:  No Current discharge risk:  Substance Abuse Barriers to Discharge:  Continued Medical Work up   Alison Murray A, [place] 01/27/2016, 5:15 PM  [phone]

## 2016-01-27 NOTE — Progress Notes (Signed)
[facility] PROGRESS NOTE  [name] [id number] DOB: [birth year] DOA: 01/23/2016 PCP: [name], MD  Brief Summary  The patient is a 70 year old female with a history of alcohol dependence, hypertension, diabetes mellitus type 2 who presented to the emergency department for alcohol detoxification. She been trying to cut back on her alcohol consumption as an outpatient, trying to detox herself, but had symptoms of withdrawal so she came to the emergency department. In the emergency department, she had an alcohol level in the 300s but was feeling shaky and agitated. She was started on CIWA protocol given IV Ativan. She had a transaminitis with an AST of 1717 and an ALT of 505. Abdominal ultrasound demonstrated no evidence of cholelithiasis but did demonstrate fatty liver changes consistent with her alcohol abuse.    Assessment/Plan  Alcohol withdrawal, hx of hallucinations but not seizures and has never needed ICU stay.  Received 20mg  of ativan yesterday with CIWA scores up to 16, however scores have been dramatically lower today. -  Changed to CIWA q4h  -  She needs to be at least 24 hours with CIWA scores less than or equal to 10. I would be very surprised if she can go from 20 g of Ativan to nothing without any symptoms of withdrawal. -  Continue MVI, thiamine, folate -  [facility] until she is no longer drinking or taking any benzodiazepines -  Continue zofran and add phenergan prn  Transaminitis due to EtOH abuse, continues to improve -  Hepatitis panel negative -  Tylenol neg -  ASA neg -  US demonstrated fatty liver consistent with alcoholism -  Bilirubin minimally elevated.  INR 1.46 -  No jaundice or severe pain to suggest acute alcoholic hepatitis  Escherichia coli urinary tract infection with low-grade fever, present at time of admission, NOT associated with UTI. -  Changed to Keflex to complete a seven-day course of  antibiotics  Essential hypertension, blood pressures mildly elevated -  Continue bystolic -  Resume ACE inhibitor  DM type 2, CBGs low normal, discontinue sliding scale insulin   HX of UC per patient with chronic diarrhea, not on any normal maintenance medications.  Still having frequent stools -  Continue scheduled lomotil BID with imodium prn  Hypokalemia, hypocalcemia, hypophosphatemia, and hypomagnesemia, secondary to poor nutrition, improving -  Increase oral magnesium supplementation  Diet:  diabetic Access:  PIV IVF:  Off Proph:  lovenox  Code Status: full Family Communication: patient alone [facility].  Needs to have CIWA scores of 10 or less for 24 hours before discharge.  Possibly home tomorrow depending on how she does overnight  Consultants:  None today  Procedures:  RUQ [place]  Antibiotics:  Ceftriaxone from 2/12-2/13  Keflex 2/14  HPI/Subjective:  Would really like to go home today. States she does not feel like she has had severe symptoms of withdrawal. I tried to explain that she received 20 mg of Ativan yesterday but still had difficulty sleeping, concerning that she is just being adequately treated for withdrawal but with large doses of benzodiazepines. Explained that she needs to go 24 hours without serious symptoms of withdrawal before she can safely be discharged.  Denies nausea, vomiting. Has been tolerating her food. Her diarrhea has decreased in frequency and is slightly more formed than before.  Objective: Filed Vitals:   01/26/16 1238 01/26/16 2205 01/27/16 0649 01/27/16 1325  BP: 128/68 146/71 141/71 146/69  Pulse: 62 77 72 69  Temp: 99.1 F (37.3 C) 98.4 F (36.9 C) 97.9 F (36.6 C) 98.2 F (36.8 C)  TempSrc: Oral Oral Oral Oral  Resp: 18 18 18 16   Height:      Weight:      SpO2: 99% 99% 97% 98%    Intake/Output Summary (Last 24 hours) at 01/27/16 1638 Last data filed at 01/27/16 1325  Gross  per 24 hour  Intake    760 ml  Output    180 ml  Net    580 ml   Filed Weights   01/24/16 0556  Weight: 71.623 kg (157 lb 14.4 oz)   Body mass index is 27.09 kg/(m^2).  Exam:   General:  Adult female, demanding to go home, mildly  tremulous   HEENT:  NCAT, MMM minimale fasciculations   Cardiovascular:  RRR, nl S1, S2 no mrg, 2+ pulses, warm extremities  Respiratory:  CTAB, no increased WOB  Abdomen:   NABS, soft, NT/ND  MSK:   Normal tone and bulk, no LEE Neuro:  Grossly intact  Data Reviewed: Basic Metabolic Panel:  Recent Labs Lab 01/23/16 2317 01/25/16 0805 01/26/16 0458 01/27/16 0711  NA 131* 135 137 135  K 3.8 3.2* 3.7 4.3  CL 96* 104 104 99*  CO2 21* 23 21* 27  GLUCOSE 213* 85 132* 117*  BUN 18 17 19 11   CREATININE 0.51 0.78 0.67 0.59  CALCIUM 7.9* 7.4* 8.1* 8.7*  MG  --  1.6* 2.0 1.5*  PHOS  --   --  2.2* 3.3   Liver Function Tests:  Recent Labs Lab 01/23/16 2317 01/25/16 0805 01/26/16 0458 01/27/16 0711  AST 1717* 1037* 396* 137*  ALT 505* 530* 368* 250*  ALKPHOS 86 120 137* 113  BILITOT 1.8* 2.1* 0.6 1.1  PROT 6.7 5.8* 6.2* 6.7  ALBUMIN 3.9 3.2* 3.3* 3.6   No results for input(s): LIPASE, AMYLASE in the last 168 hours. No results for input(s): AMMONIA in the last 168 hours. CBC:  Recent Labs Lab 01/23/16 2317 01/25/16 0805 01/26/16 0458 01/27/16 0711  WBC 4.8 2.8* 3.2* 4.3  NEUTROABS 3.1  --   --   --   HGB 14.2 12.8 12.7 13.0  HCT 41.7 36.6 38.2 38.3  MCV 92.3 92.2 94.8 92.3  PLT 214 119* 127* 112*    Recent Results (from the past 240 hour(s))  Culture, Urine     Status: None   Collection Time: 01/25/16  3:04 AM  Result Value Ref Range Status   Specimen Description URINE, RANDOM  Final   Special Requests NONE  Final   Culture   Final    >=100,000 COLONIES/mL ESCHERICHIA COLI Performed at [facility]    Report Status 01/27/2016 FINAL  Final   Organism ID, Bacteria ESCHERICHIA COLI  Final      Susceptibility    Escherichia coli - MIC*    AMPICILLIN >=32 RESISTANT Resistant     CEFAZOLIN <=4 SENSITIVE Sensitive     CEFTRIAXONE <=1 SENSITIVE Sensitive     CIPROFLOXACIN >=4 RESISTANT Resistant     GENTAMICIN <=1 SENSITIVE Sensitive     IMIPENEM <=0.25 SENSITIVE Sensitive     NITROFURANTOIN <=16 SENSITIVE Sensitive     TRIMETH/SULFA <=20 SENSITIVE Sensitive     AMPICILLIN/SULBACTAM 16 INTERMEDIATE Intermediate     PIP/TAZO <=4 SENSITIVE Sensitive     * >=100,000 COLONIES/mL ESCHERICHIA COLI     Studies: No results found.  Scheduled Meds: . calcium carbonate  2 tablet Oral TID  . cephALEXin  500 mg Oral Q12H  . diphenoxylate-atropine  1 tablet Oral BID  . escitalopram  20 mg Oral Daily  . folic acid  1 mg Oral Daily  . hydrochlorothiazide  12.5 mg Oral Daily  . lisinopril  5 mg Oral Daily  . LORazepam  0-4 mg Oral Q2H  . magnesium chloride  2 tablet Oral BID  . multivitamin with minerals  1 tablet Oral Daily  . nebivolol  5 mg Oral Daily  . potassium chloride  40 mEq Oral Daily  . potassium phosphate (monobasic)  500 mg Oral TID WC & HS  . thiamine  100 mg Oral Daily   Continuous Infusions:    Active Problems:   Essential hypertension   EtOH dependence ([place])   DM (diabetes mellitus), type 2, uncontrolled w/ophthalmic complication ([place])   Alcohol intoxication ([place])   Chronic pain syndrome   Elevated LFTs   UTI (urinary tract infection)    Time spent: 30 min    Morenike Cuff, [place] [facility] Pager [phone]. If 7PM-7AM, please contact night-coverage at [email or web address], password [facility] 01/27/2016, 4:38 PM  LOS: 3 days

## 2016-01-27 NOTE — Progress Notes (Signed)
CSW following for possible placement into [facility] upon discharge.   CSW spoke with pt who is agreeable to CSW discussing with [name] regarding plan.   CSW spoke with [name] who is familiar with pt. [facility] states that pt has to have completed withdrawal process, not on any opiates or benzos, fully ambulatory, and medically stable before [name] will review information to determine if they can accept pt.   CSW update pt and will send [name] information when pt is medically stable for [name] to review and notify CSW if they can accept.   CSW to continue to follow.  Alison Murray, MSW, [place] Work [phone]

## 2016-01-28 DIAGNOSIS — N3000 Acute cystitis without hematuria: Secondary | ICD-10-CM

## 2016-01-28 DIAGNOSIS — F10120 Alcohol abuse with intoxication, uncomplicated: Secondary | ICD-10-CM

## 2016-01-28 DIAGNOSIS — E1165 Type 2 diabetes mellitus with hyperglycemia: Secondary | ICD-10-CM

## 2016-01-28 DIAGNOSIS — E1139 Type 2 diabetes mellitus with other diabetic ophthalmic complication: Secondary | ICD-10-CM

## 2016-01-28 DIAGNOSIS — F10239 Alcohol dependence with withdrawal, unspecified: Principal | ICD-10-CM

## 2016-01-28 DIAGNOSIS — I10 Essential (primary) hypertension: Secondary | ICD-10-CM

## 2016-01-28 DIAGNOSIS — R7989 Other specified abnormal findings of blood chemistry: Secondary | ICD-10-CM

## 2016-01-28 LAB — GLUCOSE, CAPILLARY
Glucose-Capillary: 144 mg/dL — ABNORMAL HIGH (ref 65–99)
Glucose-Capillary: 155 mg/dL — ABNORMAL HIGH (ref 65–99)

## 2016-01-28 MED ORDER — ADULT MULTIVITAMIN W/MINERALS CH: 1.0000 | ORAL_TABLET | Freq: Every day | ORAL | Status: DC

## 2016-01-28 MED ORDER — THIAMINE HCL 100 MG PO TABS: 100.0000 mg | ORAL_TABLET | Freq: Every day | ORAL | Status: DC

## 2016-01-28 MED ORDER — CEPHALEXIN 500 MG PO CAPS: 500.0000 mg | ORAL_CAPSULE | Freq: Two times a day (BID) | ORAL | Status: DC

## 2016-01-28 MED ORDER — POTASSIUM PHOSPHATE MONOBASIC 500 MG PO TABS: 500.0000 mg | ORAL_TABLET | Freq: Three times a day (TID) | ORAL | Status: DC

## 2016-01-28 MED ORDER — POTASSIUM CHLORIDE CRYS ER 20 MEQ PO TBCR: 20.0000 meq | EXTENDED_RELEASE_TABLET | Freq: Every day | ORAL | Status: DC

## 2016-01-28 MED ORDER — MAGNESIUM CHLORIDE 64 MG PO TBEC: 2.0000 | DELAYED_RELEASE_TABLET | Freq: Two times a day (BID) | ORAL | Status: DC

## 2016-01-28 MED ORDER — CHLORDIAZEPOXIDE HCL 10 MG PO CAPS: ORAL_CAPSULE | ORAL | Status: DC

## 2016-01-28 MED ORDER — FOLIC ACID 1 MG PO TABS: 1.0000 mg | ORAL_TABLET | Freq: Every day | ORAL | Status: DC

## 2016-01-28 MED ORDER — CALCIUM CARBONATE ANTACID 500 MG PO CHEW: 2.0000 | CHEWABLE_TABLET | Freq: Three times a day (TID) | ORAL | Status: DC

## 2016-01-28 NOTE — Discharge Summary (Signed)
[name], is a 70 y.o. female  DOB Jul 28, 1946  MRN [id number].  Admission date:  01/23/2016  Admitting Physician  Theressa Millard, MD  Discharge Date:  01/28/2016   Primary MD  [name], MD  Recommendations for primary care physician for things to follow:  - Please check CBC, CMP in 1 week. - Patient to follow with [name] upon discharge, husband will arrange for transfer.   Admission Diagnosis  Hyperbilirubinemia [E80.6] Elevated LFTs [R79.89] Alcohol intoxication, with unspecified complication ([place]) [id number]   Discharge Diagnosis  Hyperbilirubinemia [E80.6] Elevated LFTs [R79.89] Alcohol intoxication, with unspecified complication ([place]) [id number]   Active Problems:   Essential hypertension   EtOH dependence ([place])   DM (diabetes mellitus), type 2, uncontrolled w/ophthalmic complication (HCC)   Alcohol intoxication ([place])   Chronic pain syndrome   Elevated LFTs   UTI (urinary tract infection)      Past Medical History  Diagnosis Date  . Alcohol abuse   . Diabetes mellitus   . Hypertension   . Fibromyalgia   . Depression   . Carotid artery stenosis   . Diverticulitis   . Hepatitis 08/28/2014    Past Surgical History  Procedure Laterality Date  . Cesarean section    . Tonsillectomy    . Bunionectomy         History of present illness and  Hospital Course:     Kindly see H&P for history of present illness and admission details, please review complete Labs, Consult reports and Test reports for all details in brief  HPI  from the history and physical done on the day of admission [name] is a 70 y.o. female with a history of Alcohol Dependence, HTN, DM2 who presents to the ED to undergo Alcohol Detox treatment. She reports drinking heavily at times, 2 bottles of wine daily, and she has been trying to detox herself, but has had symptoms of  withdrawal. She reports trying to supplement her wine drinking with beer instead. Her last drink was at noon. She was evaluated in the ED and found to have a Alcohol level in the 300's. She reports feeling shakey and agitated. She was started on the CIWA protocol with IV Ativan. Her labs also revealed elevated transaminases with an AST = 1717, and and ALT of 505. AN Ultrasound of the ABD was performed and was negative for Cholelithiasis, but did revealed Fatty Liver changes. She was referred for admission.    Hospital Course   The patient is a 70 year old female with a history of alcohol dependence, hypertension, diabetes mellitus type 2 who presented to the emergency department for alcohol detoxification. She been trying to cut back on her alcohol consumption as an outpatient, trying to detox herself, but had symptoms of withdrawal so she came to the emergency department. In the emergency department, she had an alcohol level in the 300s but was feeling shaky and agitated. She was started on CIWA protocol given IV Ativan. She had a transaminitis with an AST of  1717 and an ALT of 505. Abdominal ultrasound demonstrated no evidence of cholelithiasis but did demonstrate fatty liver changes consistent with her alcohol abuse.   Alcohol withdrawal, hx of hallucinations but not seizures and has never needed ICU stay.Treated with CIWA protocol, high Ativan required initially, significantly tapered down, no need of Ativan over last 24 hours, CIWA score of 0, no evidence of DTs or withdrawals ,no need for Ativan or long-acting benzo diazepam or discharge. - Patient to follow with [name] on discharge  Transaminitis due to EtOH abuse, continues to improve - Hepatitis panel negative - Tylenol neg - ASA neg - US demonstrated fatty liver consistent with alcoholism - Bilirubin minimally elevated. INR 1.46 - No jaundice or severe pain to suggest acute alcoholic  hepatitis  Escherichia coli urinary tract infection with low-grade fever, present at time of admission,  - Changed to Keflex to complete another 3 days on discharge  Essential hypertension, blood pressures mildly elevated - Continue bystolic - Resume ACE inhibitor  DM type 2, resume home medication on discharge  HX of UC per patient with chronic diarrhea, not on any normal maintenance medications. Still having frequent stools - Continue scheduled lomotil BID with imodium prn  Hypokalemia, hypocalcemia, hypophosphatemia, and hypomagnesemia, secondary to poor nutrition, improving - Discharged on supplemental on discharge  Discharge Condition:  Stable   Follow UP      Discharge Instructions  and  Discharge Medications     Discharge Instructions    Diet - low sodium heart healthy    Complete by:  As directed      Discharge instructions    Complete by:  As directed   Follow with Primary MD [name], MD in 7 days   Get CBC, CMP,checked  by Primary MD next visit.    Activity: As tolerated with Full fall precautions use walker/cane & assistance as needed   Disposition Home    Diet: Heart Healthy  , with feeding assistance and aspiration precautions.  For Heart failure patients - Check your Weight same time everyday, if you gain over 2 pounds, or you develop in leg swelling, experience more shortness of breath or chest pain, call your Primary MD immediately. Follow Cardiac Low Salt Diet and 1.5 lit/day fluid restriction.   On your next visit with your primary care physician please Get Medicines reviewed and adjusted.   Please request your Prim.MD to go over all Hospital Tests and Procedure/Radiological results at the follow up, please get all Hospital records sent to your Prim MD by signing hospital release before you go home.   If you experience worsening of your admission symptoms, develop shortness of breath, life threatening emergency, suicidal or  homicidal thoughts you must seek medical attention immediately by calling 911 or calling your MD immediately  if symptoms less severe.  You Must read complete instructions/literature along with all the possible adverse reactions/side effects for all the Medicines you take and that have been prescribed to you. Take any new Medicines after you have completely understood and accpet all the possible adverse reactions/side effects.   Do not drive, operating heavy machinery, perform activities at heights, swimming or participation in water activities or provide baby sitting services if your were admitted for syncope or siezures until you have seen by Primary MD or a Neurologist and advised to do so again.  Do not drive when taking Pain medications.    Do not take more than prescribed Pain, Sleep and Anxiety Medications  Special Instructions: If  you have smoked or chewed Tobacco  in the last 2 yrs please stop smoking, stop any regular Alcohol  and or any Recreational drug use.  Wear Seat belts while driving.   Please note  You were cared for by a hospitalist during your hospital stay. If you have any questions about your discharge medications or the care you received while you were in the hospital after you are discharged, you can call the unit and asked to speak with the hospitalist on call if the hospitalist that took care of you is not available. Once you are discharged, your primary care physician will handle any further medical issues. Please note that NO REFILLS for any discharge medications will be authorized once you are discharged, as it is imperative that you return to your primary care physician (or establish a relationship with a primary care physician if you do not have one) for your aftercare needs so that they can reassess your need for medications and monitor your lab values.     Increase activity slowly    Complete by:  As directed             Medication List    STOP taking  these medications        hydrOXYzine 25 MG capsule  Commonly known as:  VISTARIL     nitrofurantoin (macrocrystal-monohydrate) 100 MG capsule  Commonly known as:  MACROBID     ondansetron 8 MG tablet  Commonly known as:  ZOFRAN     Oxycodone HCl 10 MG Tabs     oxyCODONE-acetaminophen 5-325 MG tablet  Commonly known as:  PERCOCET     traMADol 50 MG tablet  Commonly known as:  ULTRAM     zaleplon 10 MG capsule  Commonly known as:  SONATA      TAKE these medications        accu-chek multiclix lancets  Use to test blood sugar 3 times daily as instructed. Dx: E11.65     amphetamine-dextroamphetamine 30 MG 24 hr capsule  Commonly known as:  ADDERALL XR  Take 30 mg by mouth daily.     b complex vitamins tablet  Take 1 tablet by mouth daily.     BELVIQ XR 20 MG Tb24  Generic drug:  Lorcaserin HCl ER  Take 1 tablet by mouth daily.     BYSTOLIC 5 MG tablet  Generic drug:  nebivolol  TAKE 1 TABLET DAILY     calcium carbonate 500 MG chewable tablet  Commonly known as:  TUMS - dosed in mg elemental calcium  Chew 2 tablets (400 mg of elemental calcium total) by mouth 3 (three) times daily.     cephALEXin 500 MG capsule  Commonly known as:  KEFLEX  Take 1 capsule (500 mg total) by mouth every 12 (twelve) hours.     chlordiazePOXIDE 10 MG capsule  Commonly known as:  LIBRIUM  Please take 1 tablet oral  three times daily for 3 days, then 1 tablet oral 2 times daily for 3 days, then 1 tablet oral daily for 3 days then stop     diphenoxylate-atropine 2.5-0.025 MG tablet  Commonly known as:  LOMOTIL  Take 1 tablet by mouth 4 (four) times daily as needed for diarrhea or loose stools.     diphenoxylate-atropine 2.5-0.025 MG tablet  Commonly known as:  LOMOTIL  Take 1 tablet by mouth 4 (four) times daily as needed for diarrhea or loose stools.     escitalopram 20 MG tablet  Commonly known as:  LEXAPRO  Take 20 mg by mouth daily.     ezetimibe 10 MG tablet  Commonly known  as:  ZETIA  Take 1 tablet (10 mg total) by mouth daily.     folic acid 1 MG tablet  Commonly known as:  FOLVITE  Take 1 tablet (1 mg total) by mouth daily.     glipiZIDE 5 MG 24 hr tablet  Commonly known as:  GLUCOTROL XL  Take 1 tablet (5 mg total) by mouth daily with breakfast.     glucose blood test strip  Commonly known as:  BAYER CONTOUR NEXT TEST  Use to test blood sugar 3 times daily as instructed. Dx code: E11.65     hydrochlorothiazide 12.5 MG tablet  Commonly known as:  HYDRODIURIL  Take 12.5 mg by mouth daily.     lisinopril 5 MG tablet  Commonly known as:  PRINIVIL,ZESTRIL  TAKE 1 TABLET BY MOUTH EVERY DAY     LORazepam 0.5 MG tablet  Commonly known as:  ATIVAN  Take 1 tablet (0.5 mg total) by mouth daily as needed for anxiety.     magnesium chloride 64 MG Tbec SR tablet  Commonly known as:  SLOW-MAG  Take 2 tablets (128 mg total) by mouth 2 (two) times daily.     multivitamin with minerals Tabs tablet  Take 1 tablet by mouth daily.     potassium chloride SA 20 MEQ tablet  Commonly known as:  K-DUR,KLOR-CON  Take 1 tablet (20 mEq total) by mouth daily.     potassium phosphate (monobasic) 500 MG tablet  Commonly known as:  K-PHOS ORIGINAL  Take 1 tablet (500 mg total) by mouth 4 (four) times daily -  with meals and at bedtime.     promethazine 12.5 MG tablet  Commonly known as:  PHENERGAN  Take 1 tablet (12.5 mg total) by mouth every 6 (six) hours as needed for nausea or vomiting.     thiamine 100 MG tablet  Take 1 tablet (100 mg total) by mouth daily.          Diet and Activity recommendation: See Discharge Instructions above   Consults obtained - nnoe   Major procedures and Radiology Reports - PLEASE review detailed and final reports for all details, in brief -      US Abdomen Complete  01/24/2016  CLINICAL DATA:  70 year old female with elevated liver function tests. EXAM: ABDOMEN ULTRASOUND COMPLETE COMPARISON:  CT dated 08/28/2014  FINDINGS: Gallbladder: No gallstones or wall thickening visualized. No sonographic Murphy sign noted by sonographer. Common bile duct: Diameter: 4 mm Liver: Diffuse increased echogenicity compatible with fatty infiltration. IVC: No abnormality visualized. Pancreas: Visualized portion unremarkable. Spleen: Size and appearance within normal limits. Right Kidney: Length: 11 cm. Echogenicity within normal limits. No mass or hydronephrosis visualized. Left Kidney: Length: 11 cm. Echogenicity within normal limits. No mass or hydronephrosis visualized. Abdominal aorta: The visualized aorta appears unremarkable. Other findings:  None IMPRESSION: Diffuse fatty infiltration of the liver otherwise unremarkable abdominal ultrasound. Electronically Signed   By: Anner Crete M.D.   On: 01/24/2016 03:06   Dg Chest Port 1 View  01/24/2016  CLINICAL DATA:  Fever of unknown origin EXAM: PORTABLE CHEST 1 VIEW COMPARISON:  08/28/2014 FINDINGS: The heart size and mediastinal contours are within normal limits. Both lungs are clear. The visualized skeletal structures are unremarkable. IMPRESSION: No active disease. Electronically Signed   By: Inez Catalina M.D.   On: 01/24/2016 19:30  Micro Results     Recent Results (from the past 240 hour(s))  Culture, Urine     Status: None   Collection Time: 01/25/16  3:04 AM  Result Value Ref Range Status   Specimen Description URINE, RANDOM  Final   Special Requests NONE  Final   Culture   Final    >=100,000 COLONIES/mL ESCHERICHIA COLI Performed at [facility]    Report Status 01/27/2016 FINAL  Final   Organism ID, Bacteria ESCHERICHIA COLI  Final      Susceptibility   Escherichia coli - MIC*    AMPICILLIN >=32 RESISTANT Resistant     CEFAZOLIN <=4 SENSITIVE Sensitive     CEFTRIAXONE <=1 SENSITIVE Sensitive     CIPROFLOXACIN >=4 RESISTANT Resistant     GENTAMICIN <=1 SENSITIVE Sensitive     IMIPENEM <=0.25 SENSITIVE Sensitive     NITROFURANTOIN <=16  SENSITIVE Sensitive     TRIMETH/SULFA <=20 SENSITIVE Sensitive     AMPICILLIN/SULBACTAM 16 INTERMEDIATE Intermediate     PIP/TAZO <=4 SENSITIVE Sensitive     * >=100,000 COLONIES/mL ESCHERICHIA COLI       Today   Subjective:   B[place] Man today has no headache,no chest abdominal pain,no new weakness tingling or numbness, feels much better wants to go home today.   Objective:   Blood pressure 126/95, pulse 67, temperature 97.6 F (36.4 C), temperature source Oral, resp. rate 18, height 5\' 4"  (1.626 m), weight 71.623 kg (157 lb 14.4 oz), SpO2 99 %.   Intake/Output Summary (Last 24 hours) at 01/28/16 1126 Last data filed at 01/27/16 1325  Gross per 24 hour  Intake    240 ml  Output      0 ml  Net    240 ml    Exam Awake Alert, Oriented x 3, No new F.N deficits, Normal affect [place].AT,PERRAL Supple Neck,No JVD, No cervical lymphadenopathy appriciated.  Symmetrical Chest wall movement, Good air movement bilaterally, CTAB RRR,No Gallops,Rubs or new Murmurs, No Parasternal Heave +ve B.Sounds, Abd Soft, Non tender, No organomegaly appriciated, No rebound -guarding or rigidity. No Cyanosis, Clubbing or edema, No new Rash or bruise, no tremors.  Data Review   CBC w Diff: Lab Results  Component Value Date   WBC 4.3 01/27/2016   HGB 13.0 01/27/2016   HCT 38.3 01/27/2016   PLT 112* 01/27/2016   LYMPHOPCT 23 01/23/2016   MONOPCT 10 01/23/2016   EOSPCT 0 01/23/2016   BASOPCT 1 01/23/2016    CMP: Lab Results  Component Value Date   NA 135 01/27/2016   K 4.3 01/27/2016   CL 99* 01/27/2016   CO2 27 01/27/2016   BUN 11 01/27/2016   CREATININE 0.59 01/27/2016   CREATININE 0.81 09/16/2014   PROT 6.7 01/27/2016   ALBUMIN 3.6 01/27/2016   BILITOT 1.1 01/27/2016   ALKPHOS 113 01/27/2016   AST 137* 01/27/2016   ALT 250* 01/27/2016  .   Total Time in preparing paper work, data evaluation and todays exam - 35 minutes  Carrol Hougland M.D on 01/28/2016 at 11:26  AM  [facility]   Office  [phone]

## 2016-01-28 NOTE — Discharge Instructions (Signed)
Follow with Primary MD [name], MD in 7 days   Get CBC, CMP,checked  by Primary MD next visit.    Activity: As tolerated with Full fall precautions use walker/cane & assistance as needed   Disposition Home    Diet: Heart Healthy  , with feeding assistance and aspiration precautions.  For Heart failure patients - Check your Weight same time everyday, if you gain over 2 pounds, or you develop in leg swelling, experience more shortness of breath or chest pain, call your Primary MD immediately. Follow Cardiac Low Salt Diet and 1.5 lit/day fluid restriction.   On your next visit with your primary care physician please Get Medicines reviewed and adjusted.   Please request your Prim.MD to go over all Hospital Tests and Procedure/Radiological results at the follow up, please get all Hospital records sent to your Prim MD by signing hospital release before you go home.   If you experience worsening of your admission symptoms, develop shortness of breath, life threatening emergency, suicidal or homicidal thoughts you must seek medical attention immediately by calling 911 or calling your MD immediately  if symptoms less severe.  You Must read complete instructions/literature along with all the possible adverse reactions/side effects for all the Medicines you take and that have been prescribed to you. Take any new Medicines after you have completely understood and accpet all the possible adverse reactions/side effects.   Do not drive, operating heavy machinery, perform activities at heights, swimming or participation in water activities or provide baby sitting services if your were admitted for syncope or siezures until you have seen by Primary MD or a Neurologist and advised to do so again.  Do not drive when taking Pain medications.    Do not take more than prescribed Pain, Sleep and Anxiety Medications  Special Instructions: If you have smoked or chewed Tobacco  in the last 2 yrs please  stop smoking, stop any regular Alcohol  and or any Recreational drug use.  Wear Seat belts while driving.   Please note  You were cared for by a hospitalist during your hospital stay. If you have any questions about your discharge medications or the care you received while you were in the hospital after you are discharged, you can call the unit and asked to speak with the hospitalist on call if the hospitalist that took care of you is not available. Once you are discharged, your primary care physician will handle any further medical issues. Please note that NO REFILLS for any discharge medications will be authorized once you are discharged, as it is imperative that you return to your primary care physician (or establish a relationship with a primary care physician if you do not have one) for your aftercare needs so that they can reassess your need for medications and monitor your lab values.

## 2016-01-28 NOTE — Care Management Note (Signed)
Case Management Note  Patient Details  Name: REILYNN DEHERRERA MRN: [id number] Date of Birth: [birth year]  Subjective/Objective:      70 yo admitted with Elevated LFTs.              Action/Plan: From home alone  Expected Discharge Date:                  Expected Discharge Plan:   ([name])  In-House Referral:  Clinical Social Work  Discharge planning Services  CM Consult  Post Acute Care Choice:    Choice offered to:     DME Arranged:    DME Agency:     HH Arranged:    [place] Agency:     Status of Service:  Completed, signed off  Medicare Important Message Given:    Date Medicare IM Given:    Medicare IM give by:    Date Additional Medicare IM Given:    Additional Medicare Important Message give by:     If discussed at [place] of Stay Meetings, dates discussed:    Additional Comments:  Lynnell Catalan, RN 01/28/2016, 11:56 AM

## 2016-01-28 NOTE — Progress Notes (Signed)
Pt for discharge home today.  CSW spoke with [name] and facility does not have bed available today, but will plan to admit pt tomorrow or the next. CSW connected pt with [name] regarding plans for pt to admit to [facility] from home. [name] did not request any discharge information given pt will be coming to facility from home. Pt husband plans to transport pt to home. Pt states that she feel confident that she can remain sober until admission to [facility].   CSW update MD and RN regarding plan to discharge home and then admit to [facility] from home.   No further social work needs identified at this time.   CSW signing off.   Alison Murray, MSW, [place] [facility] [phone]

## 2018-03-09 DIAGNOSIS — R0602 Shortness of breath: Secondary | ICD-10-CM | POA: Insufficient documentation

## 2018-05-05 DIAGNOSIS — J9601 Acute respiratory failure with hypoxia: Secondary | ICD-10-CM | POA: Insufficient documentation

## 2018-05-05 DIAGNOSIS — J189 Pneumonia, unspecified organism: Secondary | ICD-10-CM

## 2018-05-05 DIAGNOSIS — E1165 Type 2 diabetes mellitus with hyperglycemia: Secondary | ICD-10-CM | POA: Insufficient documentation

## 2018-05-05 DIAGNOSIS — F17200 Nicotine dependence, unspecified, uncomplicated: Secondary | ICD-10-CM | POA: Insufficient documentation

## 2018-05-05 DIAGNOSIS — IMO0002 Reserved for concepts with insufficient information to code with codable children: Secondary | ICD-10-CM | POA: Insufficient documentation

## 2018-05-05 HISTORY — DX: Pneumonia, unspecified organism: J18.9

## 2018-10-03 ENCOUNTER — Encounter (INDEPENDENT_AMBULATORY_CARE_PROVIDER_SITE_OTHER): Payer: Self-pay | Admitting: Vascular Surgery

## 2018-10-03 ENCOUNTER — Ambulatory Visit (INDEPENDENT_AMBULATORY_CARE_PROVIDER_SITE_OTHER): Payer: Medicare Other | Admitting: Vascular Surgery

## 2018-10-03 VITALS — BP 147/78 | HR 73 | Resp 18 | Ht 63.5 in | Wt 166.0 lb

## 2018-10-03 DIAGNOSIS — IMO0002 Reserved for concepts with insufficient information to code with codable children: Secondary | ICD-10-CM

## 2018-10-03 DIAGNOSIS — I6523 Occlusion and stenosis of bilateral carotid arteries: Secondary | ICD-10-CM | POA: Diagnosis not present

## 2018-10-03 DIAGNOSIS — E1165 Type 2 diabetes mellitus with hyperglycemia: Secondary | ICD-10-CM

## 2018-10-03 DIAGNOSIS — E1139 Type 2 diabetes mellitus with other diabetic ophthalmic complication: Secondary | ICD-10-CM | POA: Diagnosis not present

## 2018-10-03 DIAGNOSIS — F10232 Alcohol dependence with withdrawal with perceptual disturbance: Secondary | ICD-10-CM | POA: Diagnosis not present

## 2018-10-03 DIAGNOSIS — I10 Essential (primary) hypertension: Secondary | ICD-10-CM

## 2018-10-03 DIAGNOSIS — F10932 Alcohol use, unspecified with withdrawal with perceptual disturbance: Secondary | ICD-10-CM

## 2018-10-03 DIAGNOSIS — Z87891 Personal history of nicotine dependence: Secondary | ICD-10-CM

## 2018-10-03 DIAGNOSIS — I6529 Occlusion and stenosis of unspecified carotid artery: Secondary | ICD-10-CM | POA: Insufficient documentation

## 2018-10-03 NOTE — Assessment & Plan Note (Signed)
blood glucose control important in reducing the progression of atherosclerotic disease. Also, involved in wound healing. On appropriate medications.  

## 2018-10-03 NOTE — Assessment & Plan Note (Signed)
blood pressure control important in reducing the progression of atherosclerotic disease. On appropriate oral medications.  

## 2018-10-03 NOTE — Patient Instructions (Signed)
Carotid Artery Disease The carotid arteries are arteries on both sides of the neck. They carry blood to the brain. Carotid artery disease is when the arteries get smaller (narrow) or get blocked. If these arteries get smaller or get blocked, you are more likely to have a stroke or warning stroke (transient ischemic attack). Follow these instructions at home:  Take medicines as told by your doctor. Make sure you understand all your medicine instructions. Do not stop your medicines without talking to your doctor first.  Follow your doctor's diet instructions. It is important to eat a healthy diet that includes plenty of: ? Fresh fruits. ? Vegetables. ? Lean meats.  Avoid: ? High-fat foods. ? High-sodium foods. ? Foods that are fried, overly processed, or have poor nutritional value.  Stay a healthy weight.  Stay active. Get at least 30 minutes of activity every day.  Do not smoke.  Limit alcohol use to: ? No more than 2 drinks a day for men. ? No more than 1 drink a day for women who are not pregnant.  Do not use illegal drugs.  Keep all doctor visits as told. Get help right away if:  You have sudden weakness or loss of feeling (numbness) on one side of the body, such as the face, arm, or leg.  You have sudden confusion.  You have trouble speaking (aphasia) or understanding.  You have sudden trouble seeing out of one or both eyes.  You have sudden trouble walking.  You have dizziness or feel like you might pass out (faint).  You have a loss of balance or your movements are not steady (uncoordinated).  You have a sudden, severe headache with no known cause.  You have trouble swallowing (dysphagia). Call your local emergency services (911 in [place] Do notdrive yourself to the clinic or hospital. This information is not intended to replace advice given to you by your health care provider. Make sure you discuss any questions you have with your health care  provider. Document Released: 11/15/2012 Document Revised: 05/06/2016 Document Reviewed: 05/30/2013 Elsevier Interactive Patient Education  2018 [employer] Inc[employer]  

## 2018-10-03 NOTE — Assessment & Plan Note (Signed)
The patient has a history of carotid artery stenosis that has not been checked in several years.  It sounds like she may have had some sort of informal check recently and was told it had gotten worse but this was not a formal imaging study.  I would recommend a carotid duplex to be performed in the near future at her convenience.  I discussed the pathophysiology and natural history of carotid disease.  I discussed the reason and rationale for intervention for high-grade stenosis.  We will see her back following her study to discuss the results and determine further treatment options.

## 2018-10-03 NOTE — Progress Notes (Signed)
Patient ID: [name], female   DOB: 1946-05-31, 72 y.o.   MRN: [id number]  Chief Complaint  Patient presents with  . New Patient (Initial Visit)    Carotid Stenosis    HPI [name] is a 72 y.o. female.  I am asked to see the patient by DR. Potter for evaluation of carotid stenosis.  The patient reports 6 or 7 years ago having TIA symptoms.  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  She was treated medically and has had no further focal neurologic symptoms.  She does complain of a lot of chest pressure and shortness of breath with exertion and is slated to go back and see a cardiologist.  She does not think she has had any carotid evaluation in several years.  She denies fever or chills.   Past Medical History:  Diagnosis Date  . Alcohol abuse   . Carotid artery stenosis   . Depression   . Diabetes mellitus   . Diverticulitis   . Fibromyalgia   . Hepatitis 08/28/2014  . Hypertension     Past Surgical History:  Procedure Laterality Date  . BUNIONECTOMY    . CESAREAN SECTION    . TONSILLECTOMY      Family History  Problem Relation Age of Onset  . Cancer Mother   . Heart disease Father   . Heart disease Sister   no bleeding or clotting disorders  Social History Social History   Tobacco Use  . Smoking status: Former Smoker    Last attempt to quit: 03/11/2010    Years since quitting: 8.5  . Smokeless tobacco: Never Used  Substance Use Topics  . Alcohol use: Yes    Comment: 4-5 bottles of wine per day / 2-3 quarts per day  . Drug use: No    Comment: Pt denies     Allergies  Allergen Reactions  . Latex Hives  . Lipitor [Atorvastatin Calcium] Other (See Comments)    Muscle aches  . Codeine Rash    And nausea   . Pentazocine Lactate Rash  . Sulfonamide Derivatives Rash    Current Outpatient Medications  Medication Sig Dispense Refill  .  amphetamine-dextroamphetamine (ADDERALL XR) 30 MG 24 hr capsule Take 30 mg by mouth daily.     Marland Kitchen b complex vitamins tablet Take 1 tablet by mouth daily.    Marland Kitchen BYSTOLIC 5 MG tablet TAKE 1 TABLET DAILY 90 tablet 0  . calcium carbonate (TUMS - DOSED IN MG ELEMENTAL CALCIUM) 500 MG chewable tablet Chew 2 tablets (400 mg of elemental calcium total) by mouth 3 (three) times daily. 60 tablet 0  . Cholecalciferol (VITAMIN D3) 1000 units CAPS Take by mouth.    . COMBIVENT RESPIMAT 20-100 MCG/ACT AERS respimat INL 1 INHALATION PO QID  0  . diphenoxylate-atropine (LOMOTIL) 2.5-0.025 MG tablet Take 1 tablet by mouth 4 (four) times daily as needed for diarrhea or loose stools.     . Dulaglutide 0.75 MG/0.5ML SOPN Inject into the skin.    Marland Kitchen EPINEPHrine 0.3 mg/0.3 mL IJ SOAJ injection Inject into the muscle.    . escitalopram (LEXAPRO) 20 MG tablet Take 20 mg by mouth daily.    Marland Kitchen glucose blood (BAYER CONTOUR NEXT TEST) test strip Use to test blood sugar 3 times daily as instructed. Dx code: E11.65 100 each 11  . hydrochlorothiazide (HYDRODIURIL) 12.5 MG tablet Take  12.5 mg by mouth daily.     . Lancets (ACCU-CHEK MULTICLIX) lancets Use to test blood sugar 3 times daily as instructed. Dx: E11.65 100 each 11  . lisinopril (PRINIVIL,ZESTRIL) 5 MG tablet TAKE 1 TABLET BY MOUTH EVERY DAY 90 tablet 0  . LORazepam (ATIVAN) 0.5 MG tablet Take 1 tablet (0.5 mg total) by mouth daily as needed for anxiety. 20 tablet 0  . magnesium chloride (SLOW-MAG) 64 MG TBEC SR tablet Take 2 tablets (128 mg total) by mouth 2 (two) times daily. 30 tablet 0  . metFORMIN (GLUCOPHAGE) 1000 MG tablet Take by mouth.    . Multiple Vitamin (MULTIVITAMIN WITH MINERALS) TABS tablet Take 1 tablet by mouth daily. 30 tablet 0  . omeprazole (PRILOSEC) 40 MG capsule TK 1 C PO D  2  . potassium chloride SA (K-DUR,KLOR-CON) 20 MEQ tablet Take 1 tablet (20 mEq total) by mouth daily. 20 tablet 0  . potassium phosphate, monobasic, (K-PHOS ORIGINAL) 500 MG  tablet Take 1 tablet (500 mg total) by mouth 4 (four) times daily -  with meals and at bedtime. 60 tablet 0  . pregabalin (LYRICA) 200 MG capsule     . rosuvastatin (CRESTOR) 5 MG tablet Take by mouth.    . sitaGLIPtin (JANUVIA) 100 MG tablet TAKE 1 TABLET BY MOUTH EVERY DAY.     No current facility-administered medications for this visit.       REVIEW OF SYSTEMS (Negative unless checked)  Constitutional: [] Weight loss  [] Fever  [] Chills Cardiac: [] Chest pain   [x] Chest pressure   [] Palpitations   [] Shortness of breath when laying flat   [] Shortness of breath at rest   [x] Shortness of breath with exertion. Vascular:  [] Pain in legs with walking   [] Pain in legs at rest   [] Pain in legs when laying flat   [] Claudication   [] Pain in feet when walking  [] Pain in feet at rest  [] Pain in feet when laying flat   [] History of DVT   [] Phlebitis   [] Swelling in legs   [] Varicose veins   [] Non-healing ulcers Pulmonary:   [] Uses home oxygen   [] Productive cough   [] Hemoptysis   [] Wheeze  [] COPD   [] Asthma Neurologic:  [] Dizziness  [] Blackouts   [] Seizures   [] History of stroke   [x] History of TIA  [] Aphasia   [] Temporary blindness   [] Dysphagia   [] Weakness or numbness in arms   [] Weakness or numbness in legs Musculoskeletal:  [] Arthritis   [] Joint swelling   [] Joint pain   [] Low back pain Hematologic:  [] Easy bruising  [] Easy bleeding   [] Hypercoagulable state   [] Anemic  [] Hepatitis Gastrointestinal:  [] Blood in stool   [] Vomiting blood  [] Gastroesophageal reflux/heartburn   [] Abdominal pain Genitourinary:  [] Chronic kidney disease   [] Difficult urination  [] Frequent urination  [] Burning with urination   [] Hematuria Skin:  [] Rashes   [] Ulcers   [] Wounds Psychological:  [x] History of anxiety   [x]  History of major depression.    Physical Exam BP (!) 147/78 (BP Location: Right Arm, Patient Position: Sitting)   Pulse 73   Resp 18   Ht 5' 3.5" (1.613 m)   Wt 166 lb (75.3 kg)   BMI 28.94 kg/m    Gen:  WD/WN, NAD Head: [place]/AT, No temporalis wasting. Prominent temp pulse not noted. Ear/Nose/Throat: Hearing grossly intact, nares w/o erythema or drainage, oropharynx w/o Erythema/Exudate Eyes: Conjunctiva clear, sclera non-icteric  Neck: trachea midline.  No bruit Pulmonary:  Good air movement, clear to auscultation bilaterally.  Cardiac: RRR, normal S1, S2 Vascular:  Vessel Right Left  Radial Palpable Palpable                          PT  1+ palpable Palpable  DP  1+ palpable  1+ palpable   Gastrointestinal: soft, non-tender/non-distended.  Musculoskeletal: M/S 5/5 throughout.  Extremities without ischemic changes.  No deformity or atrophy.  No edema. Neurologic: Sensation grossly intact in extremities.  Symmetrical.  Speech is fluent. Motor exam as listed above. Psychiatric: Judgment intact Insight appears to be pretty good.  Does have somewhat of an anxious affect Dermatologic: No rashes or ulcers noted.  No cellulitis or open wounds.  Radiology No results found.  Labs No results found for this or any previous visit (from the past 2160 hour(s)).  Assessment/Plan:  Essential hypertension blood pressure control important in reducing the progression of atherosclerotic disease. On appropriate oral medications.   DM (diabetes mellitus), type 2, uncontrolled w/ophthalmic complication (HCC) blood glucose control important in reducing the progression of atherosclerotic disease. Also, involved in wound healing. On appropriate medications.   Carotid artery stenosis The patient has a history of carotid artery stenosis that has not been checked in several years.  It sounds like she may have had some sort of informal check recently and was told it had gotten worse but this was not a formal imaging study.  I would recommend a carotid duplex to be performed in the near future at her convenience.  I discussed the pathophysiology and natural history of carotid disease.  I discussed  the reason and rationale for intervention for high-grade stenosis.  We will see her back following her study to discuss the results and determine further treatment options.      Leotis Pain 10/03/2018, 12:26 PM   This note was created with Dragon medical transcription system.  Any errors from dictation are unintentional.

## 2018-10-12 ENCOUNTER — Ambulatory Visit (INDEPENDENT_AMBULATORY_CARE_PROVIDER_SITE_OTHER): Payer: Medicare Other | Admitting: Nurse Practitioner

## 2018-10-12 ENCOUNTER — Ambulatory Visit (INDEPENDENT_AMBULATORY_CARE_PROVIDER_SITE_OTHER): Payer: Medicare Other

## 2018-10-12 ENCOUNTER — Encounter (INDEPENDENT_AMBULATORY_CARE_PROVIDER_SITE_OTHER): Payer: Self-pay | Admitting: Nurse Practitioner

## 2018-10-12 VITALS — BP 131/78 | HR 72 | Resp 18 | Ht 63.5 in | Wt 165.0 lb

## 2018-10-12 DIAGNOSIS — I6523 Occlusion and stenosis of bilateral carotid arteries: Secondary | ICD-10-CM

## 2018-10-12 DIAGNOSIS — E1165 Type 2 diabetes mellitus with hyperglycemia: Secondary | ICD-10-CM

## 2018-10-12 DIAGNOSIS — E1139 Type 2 diabetes mellitus with other diabetic ophthalmic complication: Secondary | ICD-10-CM | POA: Diagnosis not present

## 2018-10-12 DIAGNOSIS — Z87891 Personal history of nicotine dependence: Secondary | ICD-10-CM

## 2018-10-12 DIAGNOSIS — I10 Essential (primary) hypertension: Secondary | ICD-10-CM

## 2018-10-12 DIAGNOSIS — IMO0002 Reserved for concepts with insufficient information to code with codable children: Secondary | ICD-10-CM

## 2018-10-13 ENCOUNTER — Encounter (INDEPENDENT_AMBULATORY_CARE_PROVIDER_SITE_OTHER): Payer: Self-pay | Admitting: Nurse Practitioner

## 2018-10-13 NOTE — Progress Notes (Signed)
Subjective:    Patient ID: [name], female    DOB: 04/20/1946, 72 y.o.   MRN: [id number] Chief Complaint  Patient presents with  . Follow-up    Bilateral Carotid follow up    HPI  [name] is a 72 y.o. female The patient is seen for follow up evaluation of carotid stenosis. The carotid stenosis followed by ultrasound.   The patient denies amaurosis fugax. There is no recent history of TIA symptoms or focal motor deficits. There is no prior documented CVA.  The patient is taking enteric-coated aspirin 81 mg daily.  There is no history of migraine headaches. There is no history of seizures.  The patient has a history of coronary artery disease, no recent episodes of angina or shortness of breath. The patient denies PAD or claudication symptoms. There is a history of hyperlipidemia which is being treated with a statin.    Carotid Duplex done today shows 1-39% bilaterally.  Study done at [facility] on 12/10/11, showed bilateral stenosis of 50-69%.    Past Medical History:  Diagnosis Date  . Alcohol abuse   . Carotid artery stenosis   . Depression   . Diabetes mellitus   . Diverticulitis   . Fibromyalgia   . Hepatitis 08/28/2014  . Hypertension     Past Surgical History:  Procedure Laterality Date  . BUNIONECTOMY    . CESAREAN SECTION    . TONSILLECTOMY      Social History   Socioeconomic History  . Marital status: Married    Spouse name: Not on file  . Number of children: Not on file  . Years of education: Not on file  . Highest education level: Not on file  Occupational History  . Not on file  Social Needs  . Financial resource strain: Not on file  . Food insecurity:    Worry: Not on file    Inability: Not on file  . Transportation needs:    Medical: Not on file    Non-medical: Not on file  Tobacco Use  . Smoking status: Former Smoker    Last attempt to quit: 03/11/2010    Years since quitting: 8.5  . Smokeless tobacco: Never Used    Substance and Sexual Activity  . Alcohol use: Yes    Comment: 4-5 bottles of wine per day / 2-3 quarts per day  . Drug use: No    Comment: Pt denies   . Sexual activity: Never  Lifestyle  . Physical activity:    Days per week: Not on file    Minutes per session: Not on file  . Stress: Not on file  Relationships  . Social connections:    Talks on phone: Not on file    Gets together: Not on file    Attends religious service: Not on file    Active member of club or organization: Not on file    Attends meetings of clubs or organizations: Not on file    Relationship status: Not on file  . Intimate partner violence:    Fear of current or ex partner: Not on file    Emotionally abused: Not on file    Physically abused: Not on file    Forced sexual activity: Not on file  Other Topics Concern  . Not on file  Social History Narrative  . Not on file    Family History  Problem Relation Age of Onset  . Cancer Mother   . Heart disease Father   .  Heart disease Sister     Allergies  Allergen Reactions  . Latex Hives  . Lipitor [Atorvastatin Calcium] Other (See Comments)    Muscle aches  . Codeine Rash    And nausea   . Pentazocine Lactate Rash  . Sulfonamide Derivatives Rash     Review of Systems   Review of Systems: Negative Unless Checked Constitutional: [] Weight loss  [] Fever  [] Chills Cardiac: [] Chest pain   []  Atrial Fibrillation  [] Palpitations   [] Shortness of breath when laying flat   [] Shortness of breath with exertion. Vascular:  [] Pain in legs with walking   [] Pain in legs with standing  [] History of DVT   [] Phlebitis   [] Swelling in legs   [] Varicose veins   [] Non-healing ulcers Pulmonary:   [] Uses home oxygen   [] Productive cough   [] Hemoptysis   [] Wheeze  [] COPD   [] Asthma Neurologic:  [] Dizziness   [] Seizures   [] History of stroke   [] History of TIA  [] Aphasia   [] Vissual changes   [] Weakness or numbness in arm   [] Weakness or numbness in leg Musculoskeletal:    [] Joint swelling   [] Joint pain   [] Low back pain  []  History of Knee Replacement Hematologic:  [] Easy bruising  [] Easy bleeding   [] Hypercoagulable state   [] Anemic Gastrointestinal:  [] Diarrhea   [] Vomiting  [] Gastroesophageal reflux/heartburn   [] Difficulty swallowing. Genitourinary:  [] Chronic kidney disease   [] Difficult urination  [] Anuric   [] Blood in urine Skin:  [] Rashes   [] Ulcers  Psychological:  [x] History of anxiety   [x]  History of major depression  []  Memory Difficulties     Objective:   Physical Exam  BP 131/78 (BP Location: Right Arm, Patient Position: Sitting)   Pulse 72   Resp 18   Ht 5' 3.5" (1.613 m)   Wt 165 lb (74.8 kg)   BMI 28.77 kg/m   Gen: WD/WN, NAD Head: [place]/AT, No temporalis wasting.  Ear/Nose/Throat: Hearing grossly intact, nares w/o erythema or drainage Eyes: PER, EOMI, sclera nonicteric.  Neck: Supple, no masses.  No JVD.  Pulmonary:  Good air movement, no use of accessory muscles.  Cardiac: RRR Vascular: no bruit auscultated Vessel Right Left  Radial Palpable Palpable   Gastrointestinal: soft, non-distended. No guarding/no peritoneal signs.  Musculoskeletal: M/S 5/5 throughout.  No deformity or atrophy.  Neurologic: Pain and light touch intact in extremities.  Symmetrical.  Speech is fluent. Motor exam as listed above. Psychiatric: Judgment intact, Mood & affect appropriate for pt's clinical situation. Dermatologic: No Venous rashes. No Ulcers Noted.  No changes consistent with cellulitis. Lymph : No Cervical lymphadenopathy, no lichenification or skin changes of chronic lymphedema.      Assessment & Plan:   1. Bilateral carotid artery stenosis Recommend:  Given the patient's asymptomatic subcritical stenosis no further invasive testing or surgery at this time.  Duplex ultrasound shows 1-39% stenosis bilaterally.  Continue antiplatelet therapy as prescribed Continue management of CAD, HTN and Hyperlipidemia Healthy heart diet,   encouraged exercise at least 4 times per week Follow up in 12 months with duplex ultrasound and physical exam  - VAS US CAROTID; Future  2. Essential hypertension Continue antihypertensive medications as already ordered, these medications have been reviewed and there are no changes at this time.   3. DM (diabetes mellitus), type 2, uncontrolled w/ophthalmic complication ([place]) Continue hypoglycemic medications as already ordered, these medications have been reviewed and there are no changes at this time.  Hgb A1C to be monitored as already arranged by primary  service    Current Outpatient Medications on File Prior to Visit  Medication Sig Dispense Refill  . amphetamine-dextroamphetamine (ADDERALL XR) 30 MG 24 hr capsule Take 30 mg by mouth daily.     Marland Kitchen b complex vitamins tablet Take 1 tablet by mouth daily.    Marland Kitchen BELVIQ XR 20 MG TB24 TAKE 1 TABLET BY MOUTH EVERY DAY IN THE MORNING  2  . BYSTOLIC 5 MG tablet TAKE 1 TABLET DAILY 90 tablet 0  . calcium carbonate (TUMS - DOSED IN MG ELEMENTAL CALCIUM) 500 MG chewable tablet Chew 2 tablets (400 mg of elemental calcium total) by mouth 3 (three) times daily. 60 tablet 0  . Cholecalciferol (VITAMIN D3) 1000 units CAPS Take by mouth.    . COMBIVENT RESPIMAT 20-100 MCG/ACT AERS respimat INL 1 INHALATION PO QID  0  . diphenoxylate-atropine (LOMOTIL) 2.5-0.025 MG tablet Take 1 tablet by mouth 4 (four) times daily as needed for diarrhea or loose stools.     . Dulaglutide 0.75 MG/0.5ML SOPN Inject into the skin.    Marland Kitchen escitalopram (LEXAPRO) 20 MG tablet Take 20 mg by mouth daily.    Marland Kitchen glucose blood (BAYER CONTOUR NEXT TEST) test strip Use to test blood sugar 3 times daily as instructed. Dx code: E11.65 100 each 11  . hydrochlorothiazide (HYDRODIURIL) 12.5 MG tablet Take 12.5 mg by mouth daily.     Marland Kitchen lisinopril (PRINIVIL,ZESTRIL) 5 MG tablet TAKE 1 TABLET BY MOUTH EVERY DAY 90 tablet 0  . LORazepam (ATIVAN) 0.5 MG tablet Take 1 tablet (0.5 mg total) by  mouth daily as needed for anxiety. 20 tablet 0  . magnesium chloride (SLOW-MAG) 64 MG TBEC SR tablet Take 2 tablets (128 mg total) by mouth 2 (two) times daily. 30 tablet 0  . metFORMIN (GLUCOPHAGE-XR) 500 MG 24 hr tablet   0  . Multiple Vitamin (MULTIVITAMIN WITH MINERALS) TABS tablet Take 1 tablet by mouth daily. 30 tablet 0  . omeprazole (PRILOSEC) 40 MG capsule TK 1 C PO D  2  . potassium chloride SA (K-DUR,KLOR-CON) 20 MEQ tablet Take 1 tablet (20 mEq total) by mouth daily. 20 tablet 0  . potassium phosphate, monobasic, (K-PHOS ORIGINAL) 500 MG tablet Take 1 tablet (500 mg total) by mouth 4 (four) times daily -  with meals and at bedtime. 60 tablet 0  . pregabalin (LYRICA) 200 MG capsule     . rosuvastatin (CRESTOR) 5 MG tablet Take by mouth.    . sitaGLIPtin (JANUVIA) 100 MG tablet TAKE 1 TABLET BY MOUTH EVERY DAY.    Marland Kitchen EPINEPHrine 0.3 mg/0.3 mL IJ SOAJ injection Inject into the muscle.    . Lancets (ACCU-CHEK MULTICLIX) lancets Use to test blood sugar 3 times daily as instructed. Dx: E11.65 (Patient not taking: Reported on 10/12/2018) 100 each 11   No current facility-administered medications on file prior to visit.     There are no Patient Instructions on file for this visit. Return in about 1 year (around 10/13/2019).   Kris Hartmann, NP  This note was completed with Sales executive.  Any errors are purely unintentional.

## 2019-09-10 ENCOUNTER — Encounter (HOSPITAL_COMMUNITY): Payer: Self-pay

## 2019-09-10 ENCOUNTER — Ambulatory Visit (HOSPITAL_COMMUNITY)
Admission: EM | Admit: 2019-09-10 | Discharge: 2019-09-10 | Disposition: A | Discharge status: Home / Self Care | Payer: Medicare Other | Attending: Family Medicine | Admitting: Family Medicine

## 2019-09-10 ENCOUNTER — Other Ambulatory Visit: Payer: Self-pay

## 2019-09-10 DIAGNOSIS — T63421A Toxic effect of venom of ants, accidental (unintentional), initial encounter: Secondary | ICD-10-CM | POA: Diagnosis not present

## 2019-09-10 DIAGNOSIS — E1142 Type 2 diabetes mellitus with diabetic polyneuropathy: Secondary | ICD-10-CM

## 2019-09-10 MED ORDER — CEPHALEXIN 500 MG PO CAPS
500.0000 mg | ORAL_CAPSULE | Freq: Two times a day (BID) | ORAL | 0 refills | Status: DC
Start: 2019-09-10 — End: 2020-08-08

## 2019-09-10 NOTE — ED Provider Notes (Signed)
[place]    CSN: [id number] Arrival date & time: 09/10/19  1650      History   Chief Complaint Chief Complaint  Patient presents with  . Insect Bite    HPI [name] is a 73 y.o. female.   HPI  Patient is here for insect bites, multiple, on both feet.  She has diabetic neuropathy and has limited feeling in her feet.  She did not feel the bites occur.  They do not itch.  They do not bother her.  She has noticed some increased redness today.  She is concerned because she is diabetic, and she wishes to avoid a diabetic foot infection.  She has not had any fever.  No drainage.  Past Medical History:  Diagnosis Date  . Alcohol abuse   . Carotid artery stenosis   . Depression   . Diabetes mellitus   . Diverticulitis   . Fibromyalgia   . Hepatitis 08/28/2014  . Hypertension     Patient Active Problem List   Diagnosis Date Noted  . Carotid artery stenosis 10/03/2018  . UTI (urinary tract infection) 01/25/2016  . Elevated LFTs 01/24/2016  . Hyperbilirubinemia   . History of alcohol abuse 09/16/2014  . Essential hypertension, benign 09/16/2014  . Anxiety state 09/16/2014  . Chronic pain syndrome 09/16/2014  . Alcohol intoxication ([place]) 08/28/2014  . Hepatitis 08/28/2014  . DM (diabetes mellitus), type 2, uncontrolled w/ophthalmic complication ([place]) [id number]  . Hypotension 07/14/2014  . Alcohol withdrawal ([place]) 07/14/2014  . EtOH dependence ([place]) 06/23/2014  . Alcohol withdrawal with perceptual disturbances ([place]) 06/09/2014  . Hyponatremia 06/09/2014  . Alcohol abuse, continuous 05/29/2012    Class: Acute  . HYPERLIPIDEMIA 08/20/2007  . ANXIETY 08/20/2007  . DEPRESSION 08/20/2007  . Essential hypertension 08/20/2007    Past Surgical History:  Procedure Laterality Date  . BUNIONECTOMY    . CESAREAN SECTION    . TONSILLECTOMY      OB History   No obstetric history on file.      Home Medications    Prior to Admission medications    Medication Sig Start Date End Date Taking? Authorizing Provider  amphetamine-dextroamphetamine (ADDERALL XR) 30 MG 24 hr capsule Take 30 mg by mouth daily.  11/25/10   [provider]  b complex vitamins tablet Take 1 tablet by mouth daily.    [provider]  BELVIQ XR 20 MG TB24 TAKE 1 TABLET BY MOUTH EVERY DAY IN THE MORNING 10/05/18   [provider]  BYSTOLIC 5 MG tablet TAKE 1 TABLET DAILY 09/26/15   Philemon Kingdom, MD  calcium carbonate (TUMS - DOSED IN MG ELEMENTAL CALCIUM) 500 MG chewable tablet Chew 2 tablets (400 mg of elemental calcium total) by mouth 3 (three) times daily. 01/28/16   Elgergawy, Silver Huguenin, MD  cephALEXin (KEFLEX) 500 MG capsule Take 1 capsule (500 mg total) by mouth 2 (two) times daily. 09/10/19   Raylene Everts, MD  Cholecalciferol (VITAMIN D3) 1000 units CAPS Take by mouth.    [provider]  COMBIVENT RESPIMAT 20-100 MCG/ACT AERS respimat INL 1 INHALATION PO QID 07/16/18   [provider]  diphenoxylate-atropine (LOMOTIL) 2.5-0.025 MG tablet Take 1 tablet by mouth 4 (four) times daily as needed for diarrhea or loose stools.  07/08/10   [provider]  Dulaglutide 0.75 MG/0.5ML SOPN Inject into the skin.    [provider]  EPINEPHrine 0.3 mg/0.3 mL IJ SOAJ injection Inject into the muscle.  [provider]  escitalopram (LEXAPRO) 20 MG tablet Take 20 mg by mouth daily.    [provider]  glucose blood (BAYER CONTOUR NEXT TEST) test strip Use to test blood sugar 3 times daily as instructed. Dx code: E11.65 02/10/15   Philemon Kingdom, MD  hydrochlorothiazide (HYDRODIURIL) 12.5 MG tablet Take 12.5 mg by mouth daily.  01/21/16   [provider]  Lancets (ACCU-CHEK MULTICLIX) lancets Use to test blood sugar 3 times daily as instructed. Dx: E11.65 Patient not taking: Reported on 10/12/2018 02/10/15   Philemon Kingdom, MD  lisinopril (PRINIVIL,ZESTRIL) 5 MG tablet TAKE 1 TABLET BY  MOUTH EVERY DAY 07/14/15   Philemon Kingdom, MD  LORazepam (ATIVAN) 0.5 MG tablet Take 1 tablet (0.5 mg total) by mouth daily as needed for anxiety. 09/16/14   [name], MD  magnesium chloride (SLOW-MAG) 64 MG TBEC SR tablet Take 2 tablets (128 mg total) by mouth 2 (two) times daily. 01/28/16   Elgergawy, Silver Huguenin, MD  metFORMIN (GLUCOPHAGE-XR) 500 MG 24 hr tablet  10/03/18   [provider]  Multiple Vitamin (MULTIVITAMIN WITH MINERALS) TABS tablet Take 1 tablet by mouth daily. 01/28/16   Elgergawy, Silver Huguenin, MD  omeprazole (PRILOSEC) 40 MG capsule TK 1 C PO D 08/30/18   [provider]  potassium chloride SA (K-DUR,KLOR-CON) 20 MEQ tablet Take 1 tablet (20 mEq total) by mouth daily. 01/28/16   Elgergawy, Silver Huguenin, MD  potassium phosphate, monobasic, (K-PHOS ORIGINAL) 500 MG tablet Take 1 tablet (500 mg total) by mouth 4 (four) times daily -  with meals and at bedtime. 01/28/16   Elgergawy, Silver Huguenin, MD  pregabalin (LYRICA) 200 MG capsule  10/08/13   [provider]  rosuvastatin (CRESTOR) 5 MG tablet Take by mouth. 07/26/18   [provider]  sitaGLIPtin (JANUVIA) 100 MG tablet TAKE 1 TABLET BY MOUTH EVERY DAY. 10/02/18   [provider]    Family History Family History  Problem Relation Age of Onset  . Cancer Mother   . Heart disease Father   . Heart disease Sister     Social History Social History   Tobacco Use  . Smoking status: Former Smoker    Quit date: 03/11/2010    Years since quitting: 9.5  . Smokeless tobacco: Never Used  Substance Use Topics  . Alcohol use: Yes    Comment: 4-5 bottles of wine per day / 2-3 quarts per day  . Drug use: No    Comment: Pt denies      Allergies   Latex, Lipitor [atorvastatin calcium], Codeine, Pentazocine lactate, and Sulfonamide derivatives   Review of Systems Review of Systems  Constitutional: Negative for chills and fever.  HENT: Negative for ear pain and sore throat.   Eyes: Negative  for pain and visual disturbance.  Respiratory: Negative for cough and shortness of breath.   Cardiovascular: Negative for chest pain and palpitations.  Gastrointestinal: Negative for abdominal pain and vomiting.  Genitourinary: Negative for dysuria and hematuria.  Musculoskeletal: Negative for arthralgias and back pain.  Skin: Positive for rash. Negative for color change.  Neurological: Negative for seizures and syncope.  All other systems reviewed and are negative.    Physical Exam Triage Vital Signs ED Triage Vitals  Enc Vitals Group     BP 09/10/19 1734 124/76     Pulse Rate 09/10/19 1734 80     Resp 09/10/19 1734 16     Temp 09/10/19 1734 98.2 F (36.8 C)  Temp Source 09/10/19 1734 Temporal     SpO2 09/10/19 1734 98 %     Weight --      Height --      Head Circumference --      Peak Flow --      Pain Score 09/10/19 1741 0     Pain Loc --      Pain Edu? --      Excl. in [place]? --    No data found.  Updated Vital Signs BP 124/76 (BP Location: Left Arm)   Pulse 80   Temp 98.2 F (36.8 C) (Temporal)   Resp 16   SpO2 98%   Visual Acuity Right Eye Distance:   Left Eye Distance:   Bilateral Distance:    Right Eye Near:   Left Eye Near:    Bilateral Near:     Physical Exam Constitutional:      General: She is not in acute distress.    Appearance: She is well-developed.  HENT:     Head: Normocephalic and atraumatic.  Eyes:     Conjunctiva/sclera: Conjunctivae normal.     Pupils: Pupils are equal, round, and reactive to light.  Neck:     Musculoskeletal: Normal range of motion.  Cardiovascular:     Rate and Rhythm: Normal rate.  Pulmonary:     Effort: Pulmonary effort is normal. No respiratory distress.  Abdominal:     General: There is no distension.     Palpations: Abdomen is soft.  Musculoskeletal: Normal range of motion.     Comments: Hallux valgus repaired on the left, hallux valgus present on the right  Skin:    General: Skin is warm and dry.      Comments: Multiple pustules on erythematous base scattered over both feet right greater than left  Neurological:     General: No focal deficit present.     Mental Status: She is alert.     Sensory: Sensory deficit present.  Psychiatric:        Mood and Affect: Mood normal.        Behavior: Behavior normal.      UC Treatments / Results  Labs (all labs ordered are listed, but only abnormal results are displayed) Labs Reviewed - No data to display  EKG   Radiology No results found.  Procedures Procedures (including critical care time)  Medications Ordered in UC Medications - No data to display  Initial Impression / Assessment and Plan / UC Course  I have reviewed the triage vital signs and the nursing notes.  Pertinent labs & imaging results that were available during my care of the patient were reviewed by me and considered in my medical decision making (see chart for details).      Final Clinical Impressions(s) / UC Diagnoses   Final diagnoses:  Fire ant bite, accidental or unintentional, initial encounter  Diabetic polyneuropathy associated with type 2 diabetes mellitus ([place])     Discharge Instructions     Wash feet daily and apply cortisone cream Take antibiotic 2 times a day until gone See your personal physician for any problems   ED Prescriptions    Medication Sig Dispense Auth. Provider   cephALEXin (KEFLEX) 500 MG capsule Take 1 capsule (500 mg total) by mouth 2 (two) times daily. 10 capsule Raylene Everts, MD     PDMP not reviewed this encounter.   Raylene Everts, MD 09/10/19 [id number]

## 2019-09-10 NOTE — Discharge Instructions (Addendum)
Wash feet daily and apply cortisone cream Take antibiotic 2 times a day until gone See your personal physician for any problems

## 2019-09-10 NOTE — ED Triage Notes (Signed)
Pt presents with unknown insect bite on both feet from unknown bug; pt states bites are not itchy or painful.

## 2019-10-12 ENCOUNTER — Ambulatory Visit (INDEPENDENT_AMBULATORY_CARE_PROVIDER_SITE_OTHER): Payer: Medicare Other | Admitting: Vascular Surgery

## 2019-10-12 ENCOUNTER — Encounter (INDEPENDENT_AMBULATORY_CARE_PROVIDER_SITE_OTHER): Payer: Medicare Other

## 2019-10-19 ENCOUNTER — Ambulatory Visit (INDEPENDENT_AMBULATORY_CARE_PROVIDER_SITE_OTHER): Payer: Medicare Other | Admitting: Vascular Surgery

## 2019-10-19 ENCOUNTER — Encounter (INDEPENDENT_AMBULATORY_CARE_PROVIDER_SITE_OTHER): Payer: Medicare Other

## 2019-11-27 ENCOUNTER — Encounter (INDEPENDENT_AMBULATORY_CARE_PROVIDER_SITE_OTHER): Payer: Self-pay | Admitting: Vascular Surgery

## 2019-11-27 ENCOUNTER — Encounter (INDEPENDENT_AMBULATORY_CARE_PROVIDER_SITE_OTHER): Payer: Self-pay

## 2019-11-27 ENCOUNTER — Ambulatory Visit (INDEPENDENT_AMBULATORY_CARE_PROVIDER_SITE_OTHER): Payer: Medicare Other | Admitting: Vascular Surgery

## 2019-11-27 ENCOUNTER — Ambulatory Visit (INDEPENDENT_AMBULATORY_CARE_PROVIDER_SITE_OTHER): Payer: Medicare Other

## 2019-11-27 ENCOUNTER — Other Ambulatory Visit: Payer: Self-pay

## 2019-11-27 VITALS — BP 136/75 | HR 74 | Resp 16 | Wt 172.4 lb

## 2019-11-27 DIAGNOSIS — E1139 Type 2 diabetes mellitus with other diabetic ophthalmic complication: Secondary | ICD-10-CM

## 2019-11-27 DIAGNOSIS — IMO0002 Reserved for concepts with insufficient information to code with codable children: Secondary | ICD-10-CM

## 2019-11-27 DIAGNOSIS — I6523 Occlusion and stenosis of bilateral carotid arteries: Secondary | ICD-10-CM

## 2019-11-27 DIAGNOSIS — E1165 Type 2 diabetes mellitus with hyperglycemia: Secondary | ICD-10-CM

## 2019-11-27 DIAGNOSIS — I10 Essential (primary) hypertension: Secondary | ICD-10-CM

## 2019-11-27 NOTE — Assessment & Plan Note (Signed)
Carotid duplex does show some progression of her disease from her previous study but more in line from studies prior to this with stenosis in the 40 to 59% range on the right and in the 60 to 79% range on the left. She should continue her aspirin and statin therapy at current.  Her disease is below the threshold for repair, but in these ranges I would recommend a 35-month follow-up and not an annual follow-up.  She will contact our office with any problems in the interim.

## 2019-11-27 NOTE — Patient Instructions (Signed)
Carotid Artery Disease  The carotid arteries are arteries on both sides of the neck. They carry blood to the brain, face, and neck. Carotid artery disease happens when these arteries become smaller (narrow) or get blocked. If these arteries become smaller or get blocked, you are more likely to have a stroke or a warning stroke (transient ischemic attack). Follow these instructions at home:  Take over-the-counter and prescription medicines only as told by your doctor.  Make sure you understand all instructions about your medicines. Do not stop taking your medicines without talking to your doctor first.  Follow your doctor's diet instructions. It is important to follow a healthy diet. ? Eat foods that include plenty of: ? Fresh fruits. ? Vegetables. ? Lean meats. ? Avoid these foods: ? Foods that are high in fat. ? Foods that are high in salt (sodium). ? Foods that are fried. ? Foods that are processed. ? Foods that have few good nutrients (poor nutritional value).  Keep a healthy weight.  Stay active. Get at least 30 minutes of activity every day.  Do not smoke.  Limit alcohol use to: ? No more than 2 drinks a day for men. ? No more than 1 drink a day for women who are not pregnant.  Do not use illegal drugs.  Keep all follow-up visits as told by your doctor. This is important. Contact a doctor if: Get help right away if:  You have any symptoms of stroke or TIA. The acronym BEFAST is an easy way to remember the main warning signs of stroke. ? B = Balance problems. Signs include dizziness, sudden trouble walking, or loss of balance ? E = Eye problems. This includes trouble seeing or a sudden change in vision. ? F = Face changes. This includes sudden weakness or numbness of the face, or the face or eyelid drooping to one side. ? A = Arm weakness or numbness. This happens suddenly and usually on one side of the body. ? S = Speech problems. This includes trouble speaking or  trouble understanding. ? T = Time. Time to call 911 or seek emergency care. Do not wait to see if symptoms go away. Make note of the time your symptoms started.  Other signs of stroke may include: ? A sudden, severe headache with no known cause. ? Feeling sick to your stomach (nauseous) or throwing up (vomiting). ? Seizure. Call your local emergency services (911 in [place] Do notdrive yourself to the clinic or hospital. Summary  The carotid arteries are arteries on both sides of the neck.  If these arteries get smaller or get blocked, you are more likely to have a stroke or a warning stroke (transient ischemic attack).  Take over-the-counter and prescription medicines only as told by your doctor.  Keep all follow-up visits as told by your doctor. This is important. This information is not intended to replace advice given to you by your health care provider. Make sure you discuss any questions you have with your health care provider. Document Released: 11/15/2012 Document Revised: 11/24/2017 Document Reviewed: 11/24/2017 Elsevier Patient Education  2020 [employer]  

## 2019-11-27 NOTE — Progress Notes (Signed)
MRN : [id number]  [name] is a 73 y.o. (10/03/1946) female who presents with chief complaint of  Chief Complaint  Patient presents with  . Follow-up    ultrasound follow up  .  History of Present Illness: Patient returns in follow-up of her carotid disease.  She has had one episode of some facial numbness which was brief and likely related more to some dental and TMJ issue she was having.  No other symptoms that are concerning for focal neurologic ischemia.  No arm or leg weakness or numbness.  No speech or swallowing difficulty.  No visual symptoms.  Carotid duplex does show some progression of her disease from her previous study but more in line from studies prior to this with stenosis in the 40 to 59% range on the right and in the 60 to 79% range on the left.  Current Outpatient Medications  Medication Sig Dispense Refill  . amphetamine-dextroamphetamine (ADDERALL XR) 30 MG 24 hr capsule Take 30 mg by mouth daily.     Marland Kitchen b complex vitamins tablet Take 1 tablet by mouth daily.    Marland Kitchen BELVIQ XR 20 MG TB24 TAKE 1 TABLET BY MOUTH EVERY DAY IN THE MORNING  2  . BYSTOLIC 5 MG tablet TAKE 1 TABLET DAILY 90 tablet 0  . Cholecalciferol (VITAMIN D3) 1000 units CAPS Take by mouth.    . COMBIVENT RESPIMAT 20-100 MCG/ACT AERS respimat INL 1 INHALATION PO QID  0  . diphenoxylate-atropine (LOMOTIL) 2.5-0.025 MG tablet Take 1 tablet by mouth 4 (four) times daily as needed for diarrhea or loose stools.     . Dulaglutide 0.75 MG/0.5ML SOPN Inject into the skin.    Marland Kitchen EPINEPHrine 0.3 mg/0.3 mL IJ SOAJ injection Inject into the muscle.    . escitalopram (LEXAPRO) 20 MG tablet Take 20 mg by mouth daily.    Marland Kitchen glucose blood (BAYER CONTOUR NEXT TEST) test strip Use to test blood sugar 3 times daily as instructed. Dx code: E11.65 100 each 11  . Lancets (ACCU-CHEK MULTICLIX) lancets Use to test blood sugar 3 times daily as instructed. Dx: E11.65 100 each 11  . lisinopril (PRINIVIL,ZESTRIL) 5 MG tablet TAKE  1 TABLET BY MOUTH EVERY DAY 90 tablet 0  . LORazepam (ATIVAN) 0.5 MG tablet Take 1 tablet (0.5 mg total) by mouth daily as needed for anxiety. 20 tablet 0  . metFORMIN (GLUCOPHAGE-XR) 500 MG 24 hr tablet   0  . Multiple Vitamin (MULTIVITAMIN WITH MINERALS) TABS tablet Take 1 tablet by mouth daily. 30 tablet 0  . omeprazole (PRILOSEC) 40 MG capsule TK 1 C PO D  2  . potassium phosphate, monobasic, (K-PHOS ORIGINAL) 500 MG tablet Take 1 tablet (500 mg total) by mouth 4 (four) times daily -  with meals and at bedtime. 60 tablet 0  . pregabalin (LYRICA) 200 MG capsule     . rosuvastatin (CRESTOR) 5 MG tablet Take by mouth.    . sitaGLIPtin (JANUVIA) 100 MG tablet TAKE 1 TABLET BY MOUTH EVERY DAY.    . calcium carbonate (TUMS - DOSED IN MG ELEMENTAL CALCIUM) 500 MG chewable tablet Chew 2 tablets (400 mg of elemental calcium total) by mouth 3 (three) times daily. (Patient not taking: Reported on 11/27/2019) 60 tablet 0  . cephALEXin (KEFLEX) 500 MG capsule Take 1 capsule (500 mg total) by mouth 2 (two) times daily. (Patient not taking: Reported on 11/27/2019) 10 capsule 0  . hydrochlorothiazide (HYDRODIURIL) 12.5 MG tablet Take 12.5 mg  by mouth daily.     . magnesium chloride (SLOW-MAG) 64 MG TBEC SR tablet Take 2 tablets (128 mg total) by mouth 2 (two) times daily. (Patient not taking: Reported on 11/27/2019) 30 tablet 0  . potassium chloride SA (K-DUR,KLOR-CON) 20 MEQ tablet Take 1 tablet (20 mEq total) by mouth daily. (Patient not taking: Reported on 11/27/2019) 20 tablet 0   No current facility-administered medications for this visit.    Past Medical History:  Diagnosis Date  . Alcohol abuse   . CAP (community acquired pneumonia) 05/05/2018  . Carotid artery stenosis   . Depression   . Diabetes mellitus   . Diverticulitis   . Fibromyalgia   . Hepatitis 08/28/2014  . Hypertension   . Myalgia and myositis 03/28/2012    Past Surgical History:  Procedure Laterality Date  . BUNIONECTOMY    .  CESAREAN SECTION    . TONSILLECTOMY       Social History   Tobacco Use  . Smoking status: Former Smoker    Quit date: 03/11/2010    Years since quitting: 9.7  . Smokeless tobacco: Never Used  Substance Use Topics  . Alcohol use: Yes    Comment: 4-5 bottles of wine per day / 2-3 quarts per day  . Drug use: No    Comment: Pt denies     Family History  Problem Relation Age of Onset  . Cancer Mother   . Heart disease Father   . Heart disease Sister      Allergies  Allergen Reactions  . Latex Hives  . Lipitor [Atorvastatin Calcium] Other (See Comments)    Muscle aches  . Codeine Rash    And nausea   . Pentazocine Lactate Rash  . Sulfonamide Derivatives Rash    REVIEW OF SYSTEMS (Negative unless checked)  Constitutional: [] ?Weight loss  [] ?Fever  [] ?Chills Cardiac: [] ?Chest pain   [x] ?Chest pressure   [] ?Palpitations   [] ?Shortness of breath when laying flat   [] ?Shortness of breath at rest   [x] ?Shortness of breath with exertion. Vascular:  [] ?Pain in legs with walking   [] ?Pain in legs at rest   [] ?Pain in legs when laying flat   [] ?Claudication   [] ?Pain in feet when walking  [] ?Pain in feet at rest  [] ?Pain in feet when laying flat   [] ?History of DVT   [] ?Phlebitis   [] ?Swelling in legs   [] ?Varicose veins   [] ?Non-healing ulcers Pulmonary:   [] ?Uses home oxygen   [] ?Productive cough   [] ?Hemoptysis   [] ?Wheeze  [] ?COPD   [] ?Asthma Neurologic:  [] ?Dizziness  [] ?Blackouts   [] ?Seizures   [] ?History of stroke   [x] ?History of TIA  [] ?Aphasia   [] ?Temporary blindness   [] ?Dysphagia   [] ?Weakness or numbness in arms   [] ?Weakness or numbness in legs Musculoskeletal:  [] ?Arthritis   [] ?Joint swelling   [] ?Joint pain   [] ?Low back pain Hematologic:  [] ?Easy bruising  [] ?Easy bleeding   [] ?Hypercoagulable state   [] ?Anemic  [] ?Hepatitis Gastrointestinal:  [] ?Blood in stool   [] ?Vomiting blood  [] ?Gastroesophageal reflux/heartburn   [] ?Abdominal pain Genitourinary:  [] ?Chronic  kidney disease   [] ?Difficult urination  [] ?Frequent urination  [] ?Burning with urination   [] ?Hematuria Skin:  [] ?Rashes   [] ?Ulcers   [] ?Wounds Psychological:  [x] ?History of anxiety   [x] ? History of major depression.  Physical Examination  Vitals:   11/27/19 1548  BP: 136/75  Pulse: 74  Resp: 16  Weight: 172 lb 6.4 oz (78.2 kg)   Body mass index is  30.06 kg/m. Gen:  WD/WN, NAD Head: [place]/AT, No temporalis wasting. Ear/Nose/Throat: Hearing grossly intact, nares w/o erythema or drainage, trachea midline Eyes: Conjunctiva clear. Sclera non-icteric Neck: Supple.  Bilateral carotid bruits Pulmonary:  Good air movement, equal and clear to auscultation bilaterally.  Cardiac: RRR, No JVD Vascular:  Vessel Right Left  Radial Palpable Palpable                  .  Musculoskeletal: M/S 5/5 throughout.  No deformity or atrophy.  No significant lower extremity edema. Neurologic: CN 2-12 intact. Sensation grossly intact in extremities.  Symmetrical.  Speech is fluent. Motor exam as listed above. Psychiatric: Judgment intact, Mood & affect appropriate for pt's clinical situation. Dermatologic: No rashes or ulcers noted.  No cellulitis or open wounds.    CBC Lab Results  Component Value Date   WBC 4.3 01/27/2016   HGB 13.0 01/27/2016   HCT 38.3 01/27/2016   MCV 92.3 01/27/2016   PLT 112 (L) 01/27/2016    BMET    Component Value Date/Time   NA 135 01/27/2016 0711   K 4.3 01/27/2016 0711   CL 99 (L) 01/27/2016 0711   CO2 27 01/27/2016 0711   GLUCOSE 117 (H) 01/27/2016 0711   GLUCOSE 123 (H) 11/17/2006 1240   BUN 11 01/27/2016 0711   CREATININE 0.59 01/27/2016 0711   CREATININE 0.81 09/16/2014 1759   CALCIUM 8.7 (L) 01/27/2016 0711   GFRNONAA >60 01/27/2016 0711   GFRNONAA 76 09/16/2014 1759   GFRAA >60 01/27/2016 0711   GFRAA 88 09/16/2014 1759   CrCl cannot be calculated (Patient's most recent lab result is older than the maximum 21 days allowed.).  COAG Lab  Results  Component Value Date   INR 1.46 01/24/2016   INR 1.05 08/28/2014   INR 0.95 06/08/2014    Radiology No results found.    Assessment/Plan Essential hypertension blood pressure control important in reducing the progression of atherosclerotic disease. On appropriate oral medications.   DM (diabetes mellitus), type 2, uncontrolled w/ophthalmic complication ([facility]) blood glucose control important in reducing the progression of atherosclerotic disease. Also, involved in wound healing. On appropriate medications.  Carotid artery stenosis Carotid duplex does show some progression of her disease from her previous study but more in line from studies prior to this with stenosis in the 40 to 59% range on the right and in the 60 to 79% range on the left. She should continue her aspirin and statin therapy at current.  Her disease is below the threshold for repair, but in these ranges I would recommend a 33-month follow-up and not an annual follow-up.  She will contact our office with any problems in the interim.    Leotis Pain, MD  11/27/2019 4:46 PM    This note was created with Dragon medical transcription system.  Any errors from dictation are purely unintentional

## 2020-01-03 ENCOUNTER — Encounter (INDEPENDENT_AMBULATORY_CARE_PROVIDER_SITE_OTHER): Payer: Self-pay

## 2020-02-22 DIAGNOSIS — Z789 Other specified health status: Secondary | ICD-10-CM | POA: Insufficient documentation

## 2020-05-28 ENCOUNTER — Other Ambulatory Visit: Payer: Self-pay | Admitting: Internal Medicine

## 2020-05-28 DIAGNOSIS — R1031 Right lower quadrant pain: Secondary | ICD-10-CM

## 2020-06-03 ENCOUNTER — Encounter (INDEPENDENT_AMBULATORY_CARE_PROVIDER_SITE_OTHER): Payer: Self-pay | Admitting: Vascular Surgery

## 2020-06-03 ENCOUNTER — Ambulatory Visit (INDEPENDENT_AMBULATORY_CARE_PROVIDER_SITE_OTHER): Payer: Medicare Other

## 2020-06-03 ENCOUNTER — Other Ambulatory Visit: Payer: Self-pay

## 2020-06-03 ENCOUNTER — Ambulatory Visit (INDEPENDENT_AMBULATORY_CARE_PROVIDER_SITE_OTHER): Payer: Medicare Other | Admitting: Vascular Surgery

## 2020-06-03 VITALS — BP 145/75 | HR 74 | Resp 16 | Wt 159.6 lb

## 2020-06-03 DIAGNOSIS — I10 Essential (primary) hypertension: Secondary | ICD-10-CM | POA: Diagnosis not present

## 2020-06-03 DIAGNOSIS — E1139 Type 2 diabetes mellitus with other diabetic ophthalmic complication: Secondary | ICD-10-CM | POA: Diagnosis not present

## 2020-06-03 DIAGNOSIS — F17200 Nicotine dependence, unspecified, uncomplicated: Secondary | ICD-10-CM

## 2020-06-03 DIAGNOSIS — I6523 Occlusion and stenosis of bilateral carotid arteries: Secondary | ICD-10-CM

## 2020-06-03 DIAGNOSIS — IMO0002 Reserved for concepts with insufficient information to code with codable children: Secondary | ICD-10-CM

## 2020-06-03 DIAGNOSIS — E1165 Type 2 diabetes mellitus with hyperglycemia: Secondary | ICD-10-CM

## 2020-06-03 DIAGNOSIS — E785 Hyperlipidemia, unspecified: Secondary | ICD-10-CM | POA: Diagnosis not present

## 2020-06-03 NOTE — Patient Instructions (Signed)
Carotid Artery Disease  Carotid artery disease is the narrowing or blockage of one or both carotid arteries. This condition is also called carotid artery stenosis. The carotid arteries are the two main blood vessels on either side of the neck. They send blood to the brain, other parts of the head, and the neck.  This condition increases your risk for a stroke or a transient ischemic attack (TIA). A TIA is a "mini-stroke" that causes stroke-like symptoms that go away quickly. What are the causes? This condition is mainly caused by a narrowing and hardening of the carotid arteries. The carotid arteries can become narrow or clogged with a buildup of plaque. Plaque includes:  Fat.  Cholesterol.  Calcium.  Other substances. What increases the risk? The following factors may make you more likely to develop this condition:  Having certain medical conditions, such as: ? High cholesterol. ? High blood pressure. ? Diabetes. ? Obesity.  Smoking.  A family history of cardiovascular disease.  Not being active or lack of regular exercise.  Being female. Men have a higher risk of having arteries become narrow and harden earlier in life than women.  Old age. What are the signs or symptoms? This condition may not have any signs or symptoms until a stroke or TIA happens. In some cases, your doctor may be able to hear a whooshing sound. This can suggest a change in blood flow caused by plaque buildup. An eye exam can also help find signs of the condition. How is this treated? This condition may be treated with more than one treatment. Treatment options include:  Lifestyle changes, such as: ? Quitting smoking. ? Getting regular exercise, or getting exercise as told by your doctor. ? Eating a healthy diet. ? Managing stress. ? Keeping a healthy weight.  Medicines to control: ? Blood pressure. ? Cholesterol. ? Blood clotting.  Surgery. You may have: ? A surgery to remove the blockages in  the carotid arteries. ? A procedure in which a small mesh tube (stent) is used to widen the blocked carotid arteries. Follow these instructions at home: Eating and drinking Follow instructions about your diet from your doctor. It is important to follow a healthy diet.  Eat a diet that includes: ? A lot of fresh fruits and vegetables. ? Low-fat (lean) meats.  Avoid these foods: ? Foods that are high in fat. ? Foods that are high in salt (sodium). ? Foods that are fried. ? Foods that are processed. ? Foods that have few good nutrients (poor nutritional value).  Lifestyle   Keep a healthy weight.  Do exercises as told by your doctor to stay active. Each week, you should get one of the following: ? At least 150 minutes of exercise that raises your heart rate and makes you sweat (moderate-intensity exercise). ? At least 75 minutes of exercise that takes a lot of effort.  Do not use any products that contain nicotine or tobacco, such as cigarettes, e-cigarettes, and chewing tobacco. If you need help quitting, ask your doctor.  Do not drink alcohol if: ? Your doctor tells you not to drink. ? You are pregnant, may be pregnant, or are planning to become pregnant.  If you drink alcohol: ? Limit how much you use to:  0-1 drink a day for women.  0-2 drinks a day for men. ? Be aware of how much alcohol is in your drink. In the [place] one drink equals one 12 oz bottle of beer (355 mL), one 5   oz glass of wine (148 mL), or one 1 oz glass of hard liquor (44 mL).  Do not use drugs.  Manage your stress. Ask your doctor for tips on how to do this. General instructions  Take over-the-counter and prescription medicines only as told by your doctor.  Keep all follow-up visits as told by your doctor. This is important. Where to find more information  American Heart Association: www.heart.org Get help right away if:  You have any signs of a stroke. "BE FAST" is an easy way to remember the  main warning signs: ? B - Balance. Signs are dizziness, sudden trouble walking, or loss of balance. ? E - Eyes. Signs are trouble seeing or a change in how you see. ? F - Face. Signs are sudden weakness or loss of feeling of the face, or the face or eyelid drooping on one side. ? A - Arms. Signs are weakness or loss of feeling in an arm. This happens suddenly and usually on one side of the body. ? S - Speech. Signs are sudden trouble speaking, slurred speech, or trouble understanding what people say. ? T - Time. Time to call emergency services. Write down what time symptoms started.  You have other signs of a stroke, such as: ? A sudden, very bad headache with no known cause. ? Feeling like you may vomit (nausea). ? Vomiting. ? A seizure. These symptoms may be an emergency. Do not wait to see if the symptoms will go away. Get medical help right away. Call your local emergency services (911 in the [place] Do not drive yourself to the hospital. Summary  The carotid arteries are blood vessels on both sides of the neck.  If these arteries get smaller or get blocked, you are more likely to have a stroke or a mini-stroke.  This condition can be treated with lifestyle changes, medicines, surgery, or a blend of these treatments.  Get help right away if you have any signs of a stroke. "BE FAST" is an easy way to remember the main warning signs of stroke. This information is not intended to replace advice given to you by your health care provider. Make sure you discuss any questions you have with your health care provider. Document Revised: 06/25/2019 Document Reviewed: 06/25/2019 Elsevier Patient Education  2020 [employer]  

## 2020-06-03 NOTE — Progress Notes (Signed)
MRN : [id number]  [name] is a 74 y.o. (06-20-1946) female who presents with chief complaint of  Chief Complaint  Patient presents with  . Follow-up    ultrasound follow up  .  History of Present Illness: Patient returns in follow-up of her carotid disease.  She has had no major problems or issues since her last visit about 6 months ago.  She denies any focal neurologic symptoms. Specifically, the patient denies amaurosis fugax, speech or swallowing difficulties, or arm or leg weakness or numbness.  She has been under a fair bit of stress and has had to travel back and forth between here in [place] because of issues within her family.  Her carotid duplex today shows carotid stenosis in the 40 to 59% range on the right which is unchanged.  On the left, there has been progression of her velocities now into the 80 to 99% range by duplex criteria.  Current Outpatient Medications  Medication Sig Dispense Refill  . amphetamine-dextroamphetamine (ADDERALL XR) 30 MG 24 hr capsule Take 30 mg by mouth daily.     Marland Kitchen b complex vitamins tablet Take 1 tablet by mouth daily.    Marland Kitchen BYSTOLIC 5 MG tablet TAKE 1 TABLET DAILY 90 tablet 0  . diphenoxylate-atropine (LOMOTIL) 2.5-0.025 MG tablet Take 1 tablet by mouth 4 (four) times daily as needed for diarrhea or loose stools.     . Dulaglutide 0.75 MG/0.5ML SOPN Inject into the skin.    Marland Kitchen EPINEPHrine 0.3 mg/0.3 mL IJ SOAJ injection Inject into the muscle.    . escitalopram (LEXAPRO) 20 MG tablet Take 20 mg by mouth daily.    Marland Kitchen glucose blood (BAYER CONTOUR NEXT TEST) test strip Use to test blood sugar 3 times daily as instructed. Dx code: E11.65 100 each 11  . Lancets (ACCU-CHEK MULTICLIX) lancets Use to test blood sugar 3 times daily as instructed. Dx: E11.65 100 each 11  . lisinopril (PRINIVIL,ZESTRIL) 5 MG tablet TAKE 1 TABLET BY MOUTH EVERY DAY 90 tablet 0  . LORazepam (ATIVAN) 0.5 MG tablet Take 1 tablet (0.5 mg total) by mouth daily as needed for  anxiety. 20 tablet 0  . metFORMIN (GLUCOPHAGE-XR) 500 MG 24 hr tablet   0  . omeprazole (PRILOSEC) 40 MG capsule TK 1 C PO D  2  . pregabalin (LYRICA) 200 MG capsule     . rosuvastatin (CRESTOR) 5 MG tablet Take by mouth.    . sitaGLIPtin (JANUVIA) 100 MG tablet TAKE 1 TABLET BY MOUTH EVERY DAY.    Marland Kitchen BELVIQ XR 20 MG TB24 TAKE 1 TABLET BY MOUTH EVERY DAY IN THE MORNING  2  . calcium carbonate (TUMS - DOSED IN MG ELEMENTAL CALCIUM) 500 MG chewable tablet Chew 2 tablets (400 mg of elemental calcium total) by mouth 3 (three) times daily. (Patient not taking: Reported on 11/27/2019) 60 tablet 0  . cephALEXin (KEFLEX) 500 MG capsule Take 1 capsule (500 mg total) by mouth 2 (two) times daily. (Patient not taking: Reported on 11/27/2019) 10 capsule 0  . Cholecalciferol (VITAMIN D3) 1000 units CAPS Take by mouth.    . COMBIVENT RESPIMAT 20-100 MCG/ACT AERS respimat INL 1 INHALATION PO QID  0  . hydrochlorothiazide (HYDRODIURIL) 12.5 MG tablet Take 12.5 mg by mouth daily.  (Patient not taking: Reported on 06/03/2020)    . magnesium chloride (SLOW-MAG) 64 MG TBEC SR tablet Take 2 tablets (128 mg total) by mouth 2 (two) times daily. (Patient not taking: Reported  on 11/27/2019) 30 tablet 0  . Multiple Vitamin (MULTIVITAMIN WITH MINERALS) TABS tablet Take 1 tablet by mouth daily. 30 tablet 0  . potassium chloride SA (K-DUR,KLOR-CON) 20 MEQ tablet Take 1 tablet (20 mEq total) by mouth daily. (Patient not taking: Reported on 11/27/2019) 20 tablet 0  . potassium phosphate, monobasic, (K-PHOS ORIGINAL) 500 MG tablet Take 1 tablet (500 mg total) by mouth 4 (four) times daily -  with meals and at bedtime. 60 tablet 0   No current facility-administered medications for this visit.    Past Medical History:  Diagnosis Date  . Alcohol abuse   . CAP (community acquired pneumonia) 05/05/2018  . Carotid artery stenosis   . Depression   . Diabetes mellitus   . Diverticulitis   . Fibromyalgia   . Hepatitis 08/28/2014    . Hypertension   . Myalgia and myositis 03/28/2012    Past Surgical History:  Procedure Laterality Date  . BUNIONECTOMY    . CESAREAN SECTION    . TONSILLECTOMY       Social History   Tobacco Use  . Smoking status: Former Smoker    Quit date: 03/11/2010    Years since quitting: 10.2  . Smokeless tobacco: Never Used  Substance Use Topics  . Alcohol use: Yes    Comment: 4-5 bottles of wine per day / 2-3 quarts per day  . Drug use: No    Comment: Pt denies      Family History  Problem Relation Age of Onset  . Cancer Mother   . Heart disease Father   . Heart disease Sister     Allergies  Allergen Reactions  . Latex Hives  . Lipitor [Atorvastatin Calcium] Other (See Comments)    Muscle aches  . Codeine Rash    And nausea   . Pentazocine Lactate Rash  . Sulfonamide Derivatives Rash    REVIEW OF SYSTEMS(Negative unless checked)  Constitutional: [] ??Weight loss[] ??Fever[] ??Chills Cardiac:[] ??Chest pain[x] ??Chest pressure[] ??Palpitations [] ??Shortness of breath when laying flat [] ??Shortness of breath at rest [x] ??Shortness of breath with exertion. Vascular: [] ??Pain in legs with walking[] ??Pain in legsat rest[] ??Pain in legs when laying flat [] ??Claudication [] ??Pain in feet when walking [] ??Pain in feet at rest [] ??Pain in feet when laying flat [] ??History of DVT [] ??Phlebitis [] ??Swelling in legs [] ??Varicose veins [] ??Non-healing ulcers Pulmonary: [] ??Uses home oxygen [] ??Productive cough[] ??Hemoptysis [] ??Wheeze [] ??COPD [] ??Asthma Neurologic: [] ??Dizziness [] ??Blackouts [] ??Seizures [] ??History of stroke [x] ??History of TIA[] ??Aphasia [] ??Temporary blindness[] ??Dysphagia [] ??Weaknessor numbness in arms [] ??Weakness or numbnessin legs Musculoskeletal: [] ??Arthritis [] ??Joint swelling [] ??Joint pain [] ??Low back pain Hematologic:[] ??Easy bruising[] ??Easy bleeding  [] ??Hypercoagulable state [] ??Anemic [] ??Hepatitis Gastrointestinal:[] ??Blood in stool[] ??Vomiting blood[] ??Gastroesophageal reflux/heartburn[] ??Abdominal pain Genitourinary: [] ??Chronic kidney disease [] ??Difficulturination [] ??Frequenturination [] ??Burning with urination[] ??Hematuria Skin: [] ??Rashes [] ??Ulcers [] ??Wounds Psychological: [x] ??History of anxiety[x] ??History of major depression.  Physical Examination  Vitals:   06/03/20 1416  BP: (!) 145/75  Pulse: 74  Resp: 16  Weight: 159 lb 9.6 oz (72.4 kg)   Body mass index is 27.83 kg/m. Gen:  WD/WN, NAD Head: [place]/AT, No temporalis wasting. Ear/Nose/Throat: Hearing grossly intact, nares w/o erythema or drainage, trachea midline Eyes: Conjunctiva clear. Sclera non-icteric Neck: Supple.  Trachea midline Pulmonary:  Good air movement, equal and clear to auscultation bilaterally.  Cardiac: RRR, No JVD Vascular:  Vessel Right Left  Radial Palpable Palpable           Musculoskeletal: M/S 5/5 throughout.  No deformity or atrophy.  No edema. Neurologic: CN 2-12 intact. Sensation grossly intact in extremities.  Symmetrical.  Speech is fluent. Motor exam as listed above. Psychiatric: Judgment  intact, Mood & affect appropriate for pt's clinical situation. Dermatologic: No rashes or ulcers noted.  No cellulitis or open wounds.      CBC Lab Results  Component Value Date   WBC 4.3 01/27/2016   HGB 13.0 01/27/2016   HCT 38.3 01/27/2016   MCV 92.3 01/27/2016   PLT 112 (L) 01/27/2016    BMET    Component Value Date/Time   NA 135 01/27/2016 0711   K 4.3 01/27/2016 0711   CL 99 (L) 01/27/2016 0711   CO2 27 01/27/2016 0711   GLUCOSE 117 (H) 01/27/2016 0711   GLUCOSE 123 (H) 11/17/2006 1240   BUN 11 01/27/2016 0711   CREATININE 0.59 01/27/2016 0711   CREATININE 0.81 09/16/2014 1759   CALCIUM 8.7 (L) 01/27/2016 0711   GFRNONAA >60 01/27/2016 0711   GFRNONAA 76 09/16/2014 1759   GFRAA >60  01/27/2016 0711   GFRAA 88 09/16/2014 1759   CrCl cannot be calculated (Patient's most recent lab result is older than the maximum 21 days allowed.).  COAG Lab Results  Component Value Date   INR 1.46 01/24/2016   INR 1.05 08/28/2014   INR 0.95 06/08/2014    Radiology No results found.   Assessment/Plan Essential hypertension blood pressure control important in reducing the progression of atherosclerotic disease. On appropriate oral medications.   DM (diabetes mellitus), type 2, uncontrolled w/ophthalmic complication (HCC) blood glucose control important in reducing the progression of atherosclerotic disease. Also, involved in wound healing. On appropriate medications.  Hyperlipidemia lipid control important in reducing the progression of atherosclerotic disease. Continue statin therapy   Tobacco dependency Represents an atherosclerotic risk factor and one of the factors that could be worsening progression of her disease.  Carotid artery stenosis Her carotid duplex today shows carotid stenosis in the 40 to 59% range on the right which is unchanged.  On the left, there has been progression of her velocities now into the 80 to 99% range by duplex criteria. Given this finding, she should have a CT scan of the neck for further evaluation of her carotid disease and determine the best form of repair for her if her disease has progressed beyond 75% as suggested by duplex.  She actually has an upcoming CT scan of the abdomen pelvis scheduled for abdominal pain and weight loss, I can reach out to one of the CT scan folks to see if this can be done at the same time.  This may not be possible due to contrast restrictions but we can ask and see what they think.  We will see her back in the office following her CT scan to discuss the results and determine further treatment options.  She should continue her aspirin and statin agent.    Leotis Pain, MD  06/03/2020 3:02 PM    This note  was created with Dragon medical transcription system.  Any errors from dictation are purely unintentional

## 2020-06-03 NOTE — Assessment & Plan Note (Signed)
Her carotid duplex today shows carotid stenosis in the 40 to 59% range on the right which is unchanged.  On the left, there has been progression of her velocities now into the 80 to 99% range by duplex criteria. Given this finding, she should have a CT scan of the neck for further evaluation of her carotid disease and determine the best form of repair for her if her disease has progressed beyond 75% as suggested by duplex.  She actually has an upcoming CT scan of the abdomen pelvis scheduled for abdominal pain and weight loss, I can reach out to one of the CT scan folks to see if this can be done at the same time.  This may not be possible due to contrast restrictions but we can ask and see what they think.  We will see her back in the office following her CT scan to discuss the results and determine further treatment options.  She should continue her aspirin and statin agent.

## 2020-06-03 NOTE — Assessment & Plan Note (Signed)
lipid control important in reducing the progression of atherosclerotic disease. Continue statin therapy  

## 2020-06-03 NOTE — Assessment & Plan Note (Signed)
Represents an atherosclerotic risk factor and one of the factors that could be worsening progression of her disease.

## 2020-06-10 ENCOUNTER — Ambulatory Visit
Admission: RE | Admit: 2020-06-10 | Discharge: 2020-06-10 | Disposition: A | Discharge status: Home / Self Care | Payer: Medicare Other | Source: Ambulatory Visit | Attending: Internal Medicine | Admitting: Internal Medicine

## 2020-06-10 ENCOUNTER — Other Ambulatory Visit: Payer: Self-pay

## 2020-06-10 DIAGNOSIS — R1031 Right lower quadrant pain: Secondary | ICD-10-CM | POA: Diagnosis present

## 2020-06-10 MED ORDER — IOHEXOL 300 MG/ML  SOLN
100.0000 mL | Freq: Once | INTRAMUSCULAR | Status: AC | PRN
  Administered 2020-06-10: 100 mL via INTRAVENOUS

## 2020-06-12 ENCOUNTER — Telehealth (INDEPENDENT_AMBULATORY_CARE_PROVIDER_SITE_OTHER): Payer: Self-pay

## 2020-06-12 NOTE — Telephone Encounter (Signed)
The pt called about having a CT scheduled she was seen on 6/22 for essential hypertension she was recommended to have a carotid CT by DR. Dew. The pt wants to know  When she can schedule this.

## 2020-06-13 NOTE — Telephone Encounter (Signed)
I called and left a Voicemail for the patient making her aware that the referral was put in for her CT and I left her the number to [facility] to call and checkup on the referral and or schedule.

## 2020-06-20 ENCOUNTER — Encounter (INDEPENDENT_AMBULATORY_CARE_PROVIDER_SITE_OTHER): Payer: Self-pay

## 2020-07-01 ENCOUNTER — Other Ambulatory Visit: Payer: Self-pay

## 2020-07-01 ENCOUNTER — Ambulatory Visit
Admission: RE | Admit: 2020-07-01 | Discharge: 2020-07-01 | Disposition: A | Discharge status: Home / Self Care | Payer: Medicare Other | Source: Ambulatory Visit | Attending: Vascular Surgery | Admitting: Vascular Surgery

## 2020-07-01 DIAGNOSIS — I6523 Occlusion and stenosis of bilateral carotid arteries: Secondary | ICD-10-CM | POA: Diagnosis present

## 2020-07-01 MED ORDER — IOHEXOL 350 MG/ML SOLN
75.0000 mL | Freq: Once | INTRAVENOUS | Status: AC | PRN
  Administered 2020-07-01: 75 mL via INTRAVENOUS

## 2020-07-11 ENCOUNTER — Other Ambulatory Visit: Payer: Self-pay

## 2020-07-11 ENCOUNTER — Ambulatory Visit (INDEPENDENT_AMBULATORY_CARE_PROVIDER_SITE_OTHER): Payer: Medicare Other | Admitting: Vascular Surgery

## 2020-07-11 ENCOUNTER — Encounter (INDEPENDENT_AMBULATORY_CARE_PROVIDER_SITE_OTHER): Payer: Self-pay | Admitting: Vascular Surgery

## 2020-07-11 VITALS — BP 150/78 | HR 69 | Resp 16 | Wt 162.6 lb

## 2020-07-11 DIAGNOSIS — E1165 Type 2 diabetes mellitus with hyperglycemia: Secondary | ICD-10-CM

## 2020-07-11 DIAGNOSIS — E1139 Type 2 diabetes mellitus with other diabetic ophthalmic complication: Secondary | ICD-10-CM

## 2020-07-11 DIAGNOSIS — I10 Essential (primary) hypertension: Secondary | ICD-10-CM

## 2020-07-11 DIAGNOSIS — F17200 Nicotine dependence, unspecified, uncomplicated: Secondary | ICD-10-CM | POA: Diagnosis not present

## 2020-07-11 DIAGNOSIS — I6523 Occlusion and stenosis of bilateral carotid arteries: Secondary | ICD-10-CM

## 2020-07-11 DIAGNOSIS — IMO0002 Reserved for concepts with insufficient information to code with codable children: Secondary | ICD-10-CM

## 2020-07-11 DIAGNOSIS — E785 Hyperlipidemia, unspecified: Secondary | ICD-10-CM

## 2020-07-11 NOTE — Patient Instructions (Signed)
Carotid Endarterectomy A carotid endarterectomy is a surgery to remove a blockage in the carotid arteries. The carotid arteries are the large blood vessels on both sides of the neck that supply blood to the brain. Carotid artery disease, also called carotid artery stenosis, is the narrowing or blockage of one or both carotid arteries. Carotid artery disease is usually caused by atherosclerosis, which is a buildup of fat and plaque in the arteries. Some buildup of plaque normally occurs with aging. The plaque may partially or totally block blood flow or cause a clot to form in the carotid arteries. This may cause a stroke. Tell a health care provider about:  Any allergies you have.  All medicines you are taking, including vitamins, herbs, eye drops, creams, and over-the-counter medicines.  Any problems you or family members have had with anesthetic medicines.  Any blood disorders you have.  Any surgeries you have had.  Any medical conditions you have, or have had, including diabetes, kidney problems, and infections.  Whether you are pregnant or may be pregnant. What are the risks? Generally, this is a safe procedure. However, problems may occur, including:  Infection.  Bleeding.  Blood clots.  Allergic reactions to medicines.  Damage to nerves near the carotid arteries. This can cause a hoarse voice or weakness of muscles in your face.  Stroke.  Seizures.  Heart attack (myocardial infarction).  Narrowing of the opened blood vessel (restenosis). This may require another surgery. What happens before the procedure? Staying hydrated Follow instructions from your health care provider about hydration, which may include:  Up to 2 hours before the procedure - you may continue to drink clear liquids, such as water, clear fruit juice, black coffee, and plain tea.  Eating and drinking restrictions Follow instructions from your health care provider about eating and drinking, which may  include:  8 hours before the procedure - stop eating heavy meals or foods, such as meat, fried foods, or fatty foods.  6 hours before the procedure - stop eating light meals or foods, such as toast or cereal.  6 hours before the procedure - stop drinking milk or drinks that contain milk.  2 hours before the procedure - stop drinking clear liquids. Medicines  Ask your health care provider about: ? Changing or stopping your regular medicines. This is especially important if you are taking diabetes medicines or blood thinners. ? Taking medicines such as aspirin and ibuprofen. These medicines can thin your blood. Do not take these medicines unless your health care provider tells you to take them. ? Taking over-the-counter medicines, vitamins, herbs, and supplements. General instructions  Do not use any products that contain nicotine or tobacco for at least 4-6 weeks, or as soon as possible, before the procedure. These products include cigarettes, e-cigarettes, and chewing tobacco. If you need help quitting, ask your health care provider.  You may need to have blood tests, a test to check heart rhythm (electrocardiogram), or a test to check blood flow (angiogram).  Plan to have someone take you home from the hospital or clinic.  Ask your health care provider: ? How your surgery site will be marked. ? What steps will be taken to help prevent infection. These may include:  Removing hair at the surgery site.  Washing skin with a germ-killing soap.  Taking antibiotic medicine. What happens during the procedure?   An IV will be inserted into one of your veins.  You will be given one or more of the following: ?  A medicine to help you relax (sedative). ? A medicine to make you fall asleep (general anesthetic).  The surgeon will make a small incision in your neck to expose the carotid artery.  A tube may be inserted into the carotid artery above and below the blockage. This tube will  allow blood to flow around the blockage during the surgery.  An incision will be made in the carotid artery at the location of the blockage.  The blockage will be removed. In some cases, a section of the carotid artery may be removed and a graft patch may be used to repair the artery.  The carotid artery will be closed with stitches (sutures).  If a tube was inserted into the artery to allow blood flow around the blockage during surgery, the tube will be removed. Once the tube is removed, blood flow through the carotid artery will be restored.  The incision in the neck will be closed with sutures.  A bandage (dressing) will be placed over your incision. The procedure may vary among health care providers and hospitals. What happens after the procedure?  Your blood pressure, heart rate, breathing rate, and blood oxygen level will be monitored until you leave the hospital or clinic.  You may have some pain or an ache in your neck for about 2 weeks. This is normal.  Do not drive for 24 hours if you were given a sedative during your procedure. Summary  A carotid endarterectomy is a surgery to remove a blockage in the carotid arteries.  The carotid arteries are the large blood vessels on both sides of the neck that supply blood to the brain.  Before the procedure, ask your health care provider about changing or stopping your regular medicines.  Follow instructions from your health care provider about eating and drinking before the procedure.  After the procedure, do not drive for 24 hours if you were given a sedative. This information is not intended to replace advice given to you by your health care provider. Make sure you discuss any questions you have with your health care provider. Document Revised: 08/08/2018 Document Reviewed: 08/08/2018 Elsevier Patient Education  2020 [employer].

## 2020-07-11 NOTE — Assessment & Plan Note (Addendum)
She has undergone a CT angiogram of her neck which I have independently reviewed.  I would generally agree with the official radiology report of a 60% right ICA stenosis with a high-grade, near occlusive left ICA stenosis just beyond the origin.  She has a reasonable aortic arch and only mild tortuosity in her carotid arteries.  There is moderate calcification.  I had a long discussion with she and her family today.  Given this degree of stenosis, clearly would benefit from intervention.  I discussed that her anatomy is acceptable for either carotid artery stenting or carotid endarterectomy.  She appears to prefer the latter which is certainly reasonable.  She does have a previous history of TIA but has not had any recent symptoms.  She should continue her aspirin and statin agent.  I discussed that we hold Plavix or other blood thinners for about 5 to 7 days before surgery, and if that is upcoming I would not start any other blood thinners at this time.  If she were to have stenting done, we would want to start Plavix several days before the stent.  She is interested in having another opinion from her neurologist which is certainly a reasonable option.  She is also scheduled to see her cardiologist next week.  I have tried to answer all of her questions and she wants to go home and think about this but seems to be understanding and desirous of having left carotid endarterectomy.

## 2020-07-11 NOTE — Progress Notes (Signed)
MRN : [id number]  [name] is a 74 y.o. (Dec 29, 1945) female who presents with chief complaint of  Chief Complaint  Patient presents with  . Follow-up    ct results  .  History of Present Illness: Patient returns today in follow up of her carotid disease.  She has undergone a CT angiogram of her neck which I have independently reviewed.  I would generally agree with the official radiology report of a 60% right ICA stenosis with a high-grade, near occlusive left ICA stenosis just beyond the origin.  She has a reasonable aortic arch and only mild tortuosity in her carotid arteries.  There is moderate calcification.  She does have a history of 2 previous TIAs several years ago.  No recent neurologic symptoms.  Current Outpatient Medications  Medication Sig Dispense Refill  . amphetamine-dextroamphetamine (ADDERALL XR) 30 MG 24 hr capsule Take 30 mg by mouth daily.     Marland Kitchen b complex vitamins tablet Take 1 tablet by mouth daily.    Marland Kitchen BYSTOLIC 5 MG tablet TAKE 1 TABLET DAILY 90 tablet 0  . diphenoxylate-atropine (LOMOTIL) 2.5-0.025 MG tablet Take 1 tablet by mouth 4 (four) times daily as needed for diarrhea or loose stools.     . Dulaglutide 0.75 MG/0.5ML SOPN Inject into the skin.    Marland Kitchen EPINEPHrine 0.3 mg/0.3 mL IJ SOAJ injection Inject into the muscle.    . escitalopram (LEXAPRO) 20 MG tablet Take 20 mg by mouth daily.    Marland Kitchen glucose blood (BAYER CONTOUR NEXT TEST) test strip Use to test blood sugar 3 times daily as instructed. Dx code: E11.65 100 each 11  . Lancets (ACCU-CHEK MULTICLIX) lancets Use to test blood sugar 3 times daily as instructed. Dx: E11.65 100 each 11  . lisinopril (PRINIVIL,ZESTRIL) 5 MG tablet TAKE 1 TABLET BY MOUTH EVERY DAY 90 tablet 0  . LORazepam (ATIVAN) 0.5 MG tablet Take 1 tablet (0.5 mg total) by mouth daily as needed for anxiety. 20 tablet 0  . metFORMIN (GLUCOPHAGE-XR) 500 MG 24 hr tablet   0  . omeprazole (PRILOSEC) 40 MG capsule TK 1 C PO D  2  . pregabalin  (LYRICA) 200 MG capsule     . rosuvastatin (CRESTOR) 5 MG tablet Take by mouth.    . sitaGLIPtin (JANUVIA) 100 MG tablet TAKE 1 TABLET BY MOUTH EVERY DAY.    Marland Kitchen BELVIQ XR 20 MG TB24 TAKE 1 TABLET BY MOUTH EVERY DAY IN THE MORNING  2  . calcium carbonate (TUMS - DOSED IN MG ELEMENTAL CALCIUM) 500 MG chewable tablet Chew 2 tablets (400 mg of elemental calcium total) by mouth 3 (three) times daily. (Patient not taking: Reported on 11/27/2019) 60 tablet 0  . cephALEXin (KEFLEX) 500 MG capsule Take 1 capsule (500 mg total) by mouth 2 (two) times daily. (Patient not taking: Reported on 11/27/2019) 10 capsule 0  . Cholecalciferol (VITAMIN D3) 1000 units CAPS Take by mouth.    . COMBIVENT RESPIMAT 20-100 MCG/ACT AERS respimat INL 1 INHALATION PO QID  0  . hydrochlorothiazide (HYDRODIURIL) 12.5 MG tablet Take 12.5 mg by mouth daily.  (Patient not taking: Reported on 06/03/2020)    . magnesium chloride (SLOW-MAG) 64 MG TBEC SR tablet Take 2 tablets (128 mg total) by mouth 2 (two) times daily. (Patient not taking: Reported on 11/27/2019) 30 tablet 0  . Multiple Vitamin (MULTIVITAMIN WITH MINERALS) TABS tablet Take 1 tablet by mouth daily. 30 tablet 0  . potassium chloride SA (K-DUR,KLOR-CON)  20 MEQ tablet Take 1 tablet (20 mEq total) by mouth daily. (Patient not taking: Reported on 11/27/2019) 20 tablet 0  . potassium phosphate, monobasic, (K-PHOS ORIGINAL) 500 MG tablet Take 1 tablet (500 mg total) by mouth 4 (four) times daily -  with meals and at bedtime. 60 tablet 0   No current facility-administered medications for this visit.    Past Medical History:  Diagnosis Date  . Alcohol abuse   . CAP (community acquired pneumonia) 05/05/2018  . Carotid artery stenosis   . Depression   . Diabetes mellitus   . Diverticulitis   . Fibromyalgia   . Hepatitis 08/28/2014  . Hypertension   . Myalgia and myositis 03/28/2012    Past Surgical History:  Procedure Laterality Date  . BUNIONECTOMY    . CESAREAN  SECTION    . TONSILLECTOMY       Social History   Tobacco Use  . Smoking status: Former Smoker    Quit date: 03/11/2010    Years since quitting: 10.3  . Smokeless tobacco: Never Used  Substance Use Topics  . Alcohol use: Yes    Comment: 4-5 bottles of wine per day / 2-3 quarts per day  . Drug use: No    Comment: Pt denies       Family History  Problem Relation Age of Onset  . Cancer Mother   . Heart disease Father   . Heart disease Sister     Allergies  Allergen Reactions  . Latex Hives  . Lipitor [Atorvastatin Calcium] Other (See Comments)    Muscle aches  . Codeine Rash    And nausea   . Pentazocine Lactate Rash  . Sulfonamide Derivatives Rash    REVIEW OF SYSTEMS(Negative unless checked)  Constitutional: [] ???Weight loss[] ???Fever[] ???Chills Cardiac:[] ???Chest pain[x] ???Chest pressure[] ???Palpitations [] ???Shortness of breath when laying flat [] ???Shortness of breath at rest [x] ???Shortness of breath with exertion. Vascular: [] ???Pain in legs with walking[] ???Pain in legsat rest[] ???Pain in legs when laying flat [] ???Claudication [] ???Pain in feet when walking [] ???Pain in feet at rest [] ???Pain in feet when laying flat [] ???History of DVT [] ???Phlebitis [] ???Swelling in legs [] ???Varicose veins [] ???Non-healing ulcers Pulmonary: [] ???Uses home oxygen [] ???Productive cough[] ???Hemoptysis [] ???Wheeze [] ???COPD [] ???Asthma Neurologic: [] ???Dizziness [] ???Blackouts [] ???Seizures [] ???History of stroke [x] ???History of TIA[] ???Aphasia [] ???Temporary blindness[] ???Dysphagia [] ???Weaknessor numbness in arms [] ???Weakness or numbnessin legs Musculoskeletal: [] ???Arthritis [] ???Joint swelling [] ???Joint pain [] ???Low back pain Hematologic:[] ???Easy bruising[] ???Easy bleeding [] ???Hypercoagulable state [] ???Anemic [] ???Hepatitis Gastrointestinal:[] ???Blood in stool[] ???Vomiting  blood[] ???Gastroesophageal reflux/heartburn[] ???Abdominal pain Genitourinary: [] ???Chronic kidney disease [] ???Difficulturination [] ???Frequenturination [] ???Burning with urination[] ???Hematuria Skin: [] ???Rashes [] ???Ulcers [] ???Wounds Psychological: [x] ???History of anxiety[x] ???History of major depression.  Physical Examination  BP (!) 150/78 (BP Location: Right Arm)   Pulse 69   Resp 16   Wt 162 lb 9.6 oz (73.8 kg)   BMI 28.35 kg/m  Gen:  WD/WN, NAD Head: [place]/AT, No temporalis wasting. Ear/Nose/Throat: Hearing grossly intact, nares w/o erythema or drainage Eyes: Conjunctiva clear. Sclera non-icteric Neck: Supple.  Trachea midline Pulmonary:  Good air movement, no use of accessory muscles.  Cardiac: RRR, no JVD Vascular:  Vessel Right Left  Radial Palpable Palpable       Musculoskeletal: M/S 5/5 throughout.  No deformity or atrophy. No edema. Neurologic: Sensation grossly intact in extremities.  Symmetrical.  Speech is fluent.  Psychiatric: Judgment intact, Mood & affect appropriate for pt's clinical situation. Dermatologic: No rashes or ulcers noted.  No cellulitis or open wounds.       Labs No results found for this or any previous visit (from the past 2160 hour(s)).  Radiology CT  Angio Neck W/Cm &/Or Wo/Cm  Result Date: 07/02/2020 CLINICAL DATA:  Carotid artery stenosis. 80-99% stenosis of the left carotid artery by carotid duplex 06/03/2020, increased from 6 months prior. EXAM: CT ANGIOGRAPHY NECK TECHNIQUE: Multidetector CT imaging of the neck was performed using the standard protocol during bolus administration of intravenous contrast. Multiplanar CT image reconstructions and MIPs were obtained to evaluate the vascular anatomy. Carotid stenosis measurements (when applicable) are obtained utilizing NASCET criteria, using the distal internal carotid diameter as the denominator. CONTRAST:  110mL OMNIPAQUE IOHEXOL 350 MG/ML SOLN COMPARISON:   Carotid Doppler ultrasound 06/03/20 and 11/27/2019 FINDINGS: Aortic arch: Atherosclerotic calcifications are present at the aortic arch and origins of the great vessels without significant stenosis. No aneurysm is present. Right carotid system: Atherosclerotic calcifications are present in the wall of the distal right common carotid artery. The lumen is narrowed to 3.2 mm compared with more distal measurement of 5.5 mm. Extensive calcified and noncalcified plaque are present in the distal left common carotid artery and through the bifurcation. The right external carotid artery demonstrates high-grade stenosis. Dense calcification is present along the lateral posterior margin of the proximal right ICA. The lumen is measured scratched at the lumen is narrowed to 1.4 mm. This compares with a more distal lumen of 4.5 mm. No significant stenosis is present in more distal right common carotid artery through the skull base. Left carotid system: The left common carotid artery is within normal limits proximally. Atherosclerotic changes are present in the wall of the distal left common carotid artery without significant stenosis. A high-grade, near occlusive stenosis is present in the proximal left ICA. The lumen is narrowed to less than 1 mm. More distal vessel measures 3.8 mm decreased in caliber compared to 4.5 mm on the right. Vertebral arteries: The vertebral arteries originate from the subclavian arteries bilaterally. The left vertebral artery is hypoplastic. Focal stenosis is present at the origin of the hypoplastic left vertebral artery. No significant stenosis is present on the right. A very small left V4 segment reaches the vertebrobasilar junction. Skeleton: Degenerative changes are present cervical spine with straightening of normal cervical lordosis. No focal lytic or blastic lesions are present. Other neck: Soft tissues the neck are otherwise unremarkable. Upper chest: Lung apices are clear. Thoracic inlet is  within normal limits. IMPRESSION: 1. High-grade, near occlusive stenosis of the proximal left ICA. The lumen is narrowed to less than 1 mm. More distal left ICA is also decreased in caliber relative to the distal right ICA. The findings are consistent with a high-grade stenosis. 2. 60% stenosis of the distal right common carotid artery. 3. High-grade stenosis of the right external carotid artery. 4. Focal stenosis at the origin of the hypoplastic left vertebral artery. 5. Aortic Atherosclerosis (ICD10-I70.0). 6. Multilevel degenerative changes within the cervical spine. Electronically Signed   By: San Morelle M.D.   On: 07/02/2020 11:10    Assessment/Plan Essential hypertension blood pressure control important in reducing the progression of atherosclerotic disease. On appropriate oral medications.   DM (diabetes mellitus), type 2, uncontrolled w/ophthalmic complication (HCC) blood glucose control important in reducing the progression of atherosclerotic disease. Also, involved in wound healing. On appropriate medications.  Hyperlipidemia lipid control important in reducing the progression of atherosclerotic disease. Continue statin therapy   Tobacco dependency Represents an atherosclerotic risk factor and one of the factors that could be worsening progression of her disease.  Carotid artery stenosis She has undergone a CT angiogram of her neck which I have  independently reviewed.  I would generally agree with the official radiology report of a 60% right ICA stenosis with a high-grade, near occlusive left ICA stenosis just beyond the origin.  She has a reasonable aortic arch and only mild tortuosity in her carotid arteries.  There is moderate calcification.  I had a long discussion with she and her family today.  Given this degree of stenosis, clearly would benefit from intervention.  I discussed that her anatomy is acceptable for either carotid artery stenting or carotid  endarterectomy.  She appears to prefer the latter which is certainly reasonable.  She does have a previous history of TIA but has not had any recent symptoms.  She should continue her aspirin and statin agent.  I discussed that we hold Plavix or other blood thinners for about 5 to 7 days before surgery, and if that is upcoming I would not start any other blood thinners at this time.  If she were to have stenting done, we would want to start Plavix several days before the stent.  She is interested in having another opinion from her neurologist which is certainly a reasonable option.  She is also scheduled to see her cardiologist next week.  I have tried to answer all of her questions and she wants to go home and think about this but seems to be understanding and desirous of having left carotid endarterectomy.    Leotis Pain, MD  07/11/2020 12:38 PM    This note was created with Dragon medical transcription system.  Any errors from dictation are purely unintentional

## 2020-07-29 ENCOUNTER — Ambulatory Visit (INDEPENDENT_AMBULATORY_CARE_PROVIDER_SITE_OTHER): Payer: Medicare Other | Admitting: Vascular Surgery

## 2020-07-29 ENCOUNTER — Other Ambulatory Visit: Payer: Self-pay

## 2020-07-29 VITALS — BP 117/65 | HR 78 | Resp 16 | Wt 155.0 lb

## 2020-07-29 DIAGNOSIS — E1139 Type 2 diabetes mellitus with other diabetic ophthalmic complication: Secondary | ICD-10-CM

## 2020-07-29 DIAGNOSIS — E785 Hyperlipidemia, unspecified: Secondary | ICD-10-CM | POA: Diagnosis not present

## 2020-07-29 DIAGNOSIS — I10 Essential (primary) hypertension: Secondary | ICD-10-CM

## 2020-07-29 DIAGNOSIS — E1165 Type 2 diabetes mellitus with hyperglycemia: Secondary | ICD-10-CM

## 2020-07-29 DIAGNOSIS — I6523 Occlusion and stenosis of bilateral carotid arteries: Secondary | ICD-10-CM | POA: Diagnosis not present

## 2020-07-29 DIAGNOSIS — IMO0002 Reserved for concepts with insufficient information to code with codable children: Secondary | ICD-10-CM

## 2020-07-29 NOTE — Assessment & Plan Note (Signed)
After a long discussion today with her husband and the patient, she has decided to proceed with left carotid stent placement.  I have discussed the risks and benefits of the procedure.  I have given her a prescription for Plavix 75 mg daily to go ahead and start.  She will need to be taking this at least 5 days prior to the procedure.  We will get her on the schedule for left carotid artery stent placement likely sometime next week.

## 2020-07-29 NOTE — H&P (View-Only) (Signed)
MRN : [id number]  [name] is a 74 y.o. (1946-05-09) female who presents with chief complaint of  Chief Complaint  Patient presents with  . Follow-up    discuss surgery  .  History of Present Illness: Patient returns today in follow up of her carotid disease to again discuss surgery versus stenting.  She had not made her decision on which to proceed with at her last visit a couple of weeks ago, and had further questions.  She saw her cardiologist and they discussed the situation and she now is more interested in proceeding with carotid stent placement.  She does have significant comorbidities including her heart disease and lots of pain issues and is much more concerned about open surgical therapy.  I have previously reviewed her CT angiogram and we did again discuss the results today  Current Outpatient Medications  Medication Sig Dispense Refill  . amphetamine-dextroamphetamine (ADDERALL XR) 30 MG 24 hr capsule Take 30 mg by mouth daily.     Marland Kitchen b complex vitamins tablet Take 1 tablet by mouth daily.    Marland Kitchen BYSTOLIC 5 MG tablet TAKE 1 TABLET DAILY 90 tablet 0  . diphenoxylate-atropine (LOMOTIL) 2.5-0.025 MG tablet Take 1 tablet by mouth 4 (four) times daily as needed for diarrhea or loose stools.     . Dulaglutide 0.75 MG/0.5ML SOPN Inject into the skin.    Marland Kitchen EPINEPHrine 0.3 mg/0.3 mL IJ SOAJ injection Inject into the muscle.    . escitalopram (LEXAPRO) 20 MG tablet Take 20 mg by mouth daily.    Marland Kitchen glucose blood (BAYER CONTOUR NEXT TEST) test strip Use to test blood sugar 3 times daily as instructed. Dx code: E11.65 100 each 11  . hydrochlorothiazide (HYDRODIURIL) 12.5 MG tablet Take 12.5 mg by mouth daily.     . Lancets (ACCU-CHEK MULTICLIX) lancets Use to test blood sugar 3 times daily as instructed. Dx: E11.65 100 each 11  . lisinopril (PRINIVIL,ZESTRIL) 5 MG tablet TAKE 1 TABLET BY MOUTH EVERY DAY 90 tablet 0  . LORazepam (ATIVAN) 0.5 MG tablet Take 1 tablet (0.5 mg total) by  mouth daily as needed for anxiety. 20 tablet 0  . metFORMIN (GLUCOPHAGE-XR) 500 MG 24 hr tablet   0  . omeprazole (PRILOSEC) 40 MG capsule TK 1 C PO D  2  . pregabalin (LYRICA) 200 MG capsule     . rosuvastatin (CRESTOR) 5 MG tablet Take by mouth.    . sitaGLIPtin (JANUVIA) 100 MG tablet TAKE 1 TABLET BY MOUTH EVERY DAY.    Marland Kitchen BELVIQ XR 20 MG TB24 TAKE 1 TABLET BY MOUTH EVERY DAY IN THE MORNING  2  . calcium carbonate (TUMS - DOSED IN MG ELEMENTAL CALCIUM) 500 MG chewable tablet Chew 2 tablets (400 mg of elemental calcium total) by mouth 3 (three) times daily. (Patient not taking: Reported on 11/27/2019) 60 tablet 0  . cephALEXin (KEFLEX) 500 MG capsule Take 1 capsule (500 mg total) by mouth 2 (two) times daily. (Patient not taking: Reported on 11/27/2019) 10 capsule 0  . Cholecalciferol (VITAMIN D3) 1000 units CAPS Take by mouth.    . COMBIVENT RESPIMAT 20-100 MCG/ACT AERS respimat INL 1 INHALATION PO QID  0  . magnesium chloride (SLOW-MAG) 64 MG TBEC SR tablet Take 2 tablets (128 mg total) by mouth 2 (two) times daily. (Patient not taking: Reported on 11/27/2019) 30 tablet 0  . Multiple Vitamin (MULTIVITAMIN WITH MINERALS) TABS tablet Take 1 tablet by mouth daily. 30 tablet 0  .  potassium chloride SA (K-DUR,KLOR-CON) 20 MEQ tablet Take 1 tablet (20 mEq total) by mouth daily. (Patient not taking: Reported on 11/27/2019) 20 tablet 0  . potassium phosphate, monobasic, (K-PHOS ORIGINAL) 500 MG tablet Take 1 tablet (500 mg total) by mouth 4 (four) times daily -  with meals and at bedtime. 60 tablet 0   No current facility-administered medications for this visit.    Past Medical History:  Diagnosis Date  . Alcohol abuse   . CAP (community acquired pneumonia) 05/05/2018  . Carotid artery stenosis   . Depression   . Diabetes mellitus   . Diverticulitis   . Fibromyalgia   . Hepatitis 08/28/2014  . Hypertension   . Myalgia and myositis 03/28/2012    Past Surgical History:  Procedure Laterality  Date  . BUNIONECTOMY    . CESAREAN SECTION    . TONSILLECTOMY       Social History   Tobacco Use  . Smoking status: Former Smoker    Quit date: 03/11/2010    Years since quitting: 10.3  . Smokeless tobacco: Never Used  Substance Use Topics  . Alcohol use: Yes    Comment: 4-5 bottles of wine per day / 2-3 quarts per day  . Drug use: No    Comment: Pt denies       Family History  Problem Relation Age of Onset  . Cancer Mother   . Heart disease Father   . Heart disease Sister     Allergies  Allergen Reactions  . Latex Hives  . Lipitor [Atorvastatin Calcium] Other (See Comments)    Muscle aches  . Codeine Rash    And nausea   . Pentazocine Lactate Rash  . Sulfonamide Derivatives Rash    REVIEW OF SYSTEMS(Negative unless checked)  Constitutional: [] ????Weight loss[] ????Fever[] ????Chills Cardiac:[] ????Chest pain[x] ????Chest pressure[] ????Palpitations [] ????Shortness of breath when laying flat [] ????Shortness of breath at rest [x] ????Shortness of breath with exertion. Vascular: [] ????Pain in legs with walking[] ????Pain in legsat rest[] ????Pain in legs when laying flat [] ????Claudication [] ????Pain in feet when walking [] ????Pain in feet at rest [] ????Pain in feet when laying flat [] ????History of DVT [] ????Phlebitis [] ????Swelling in legs [] ????Varicose veins [] ????Non-healing ulcers Pulmonary: [] ????Uses home oxygen [] ????Productive cough[] ????Hemoptysis [] ????Wheeze [] ????COPD [] ????Asthma Neurologic: [] ????Dizziness [] ????Blackouts [] ????Seizures [] ????History of stroke [x] ????History of TIA[] ????Aphasia [] ????Temporary blindness[] ????Dysphagia [] ????Weaknessor numbness in arms [] ????Weakness or numbnessin legs Musculoskeletal: [] ????Arthritis [] ????Joint swelling [] ????Joint pain [] ????Low back pain Hematologic:[] ????Easy bruising[] ????Easy bleeding [] ????Hypercoagulable state  [] ????Anemic [] ????Hepatitis Gastrointestinal:[] ????Blood in stool[] ????Vomiting blood[] ????Gastroesophageal reflux/heartburn[] ????Abdominal pain Genitourinary: [] ????Chronic kidney disease [] ????Difficulturination [] ????Frequenturination [] ????Burning with urination[] ????Hematuria Skin: [] ????Rashes [] ????Ulcers [] ????Wounds Psychological: [x] ????History of anxiety[x] ????History of major depression.   Physical Examination  BP 117/65 (BP Location: Right Arm)   Pulse 78   Resp 16   Wt 155 lb (70.3 kg)   BMI 27.03 kg/m  Gen:  WD/WN, NAD Head: [place]/AT, No temporalis wasting. Ear/Nose/Throat: Hearing grossly intact, nares w/o erythema or drainage Eyes: Conjunctiva clear. Sclera non-icteric Neck: Supple.  Trachea midline Pulmonary:  Good air movement, no use of accessory muscles.  Cardiac: RRR, no JVD Vascular:  Vessel Right Left  Radial Palpable Palpable       Musculoskeletal: M/S 5/5 throughout.  No deformity or atrophy.  No significant edema. Neurologic: Sensation grossly intact in extremities.  Symmetrical.  Speech is fluent.  Psychiatric: Judgment intact, Mood & affect appropriate for pt's clinical situation. Dermatologic: No rashes or ulcers noted.  No cellulitis or open wounds.       Labs No results found for this or any previous visit (from the past 2160  hour(s)).  Radiology CT Angio Neck W/Cm &/Or Wo/Cm  Result Date: 07/02/2020 CLINICAL DATA:  Carotid artery stenosis. 80-99% stenosis of the left carotid artery by carotid duplex 06/03/2020, increased from 6 months prior. EXAM: CT ANGIOGRAPHY NECK TECHNIQUE: Multidetector CT imaging of the neck was performed using the standard protocol during bolus administration of intravenous contrast. Multiplanar CT image reconstructions and MIPs were obtained to evaluate the vascular anatomy. Carotid stenosis measurements (when applicable) are obtained utilizing NASCET criteria, using the distal internal  carotid diameter as the denominator. CONTRAST:  59mL OMNIPAQUE IOHEXOL 350 MG/ML SOLN COMPARISON:  Carotid Doppler ultrasound 06/03/20 and 11/27/2019 FINDINGS: Aortic arch: Atherosclerotic calcifications are present at the aortic arch and origins of the great vessels without significant stenosis. No aneurysm is present. Right carotid system: Atherosclerotic calcifications are present in the wall of the distal right common carotid artery. The lumen is narrowed to 3.2 mm compared with more distal measurement of 5.5 mm. Extensive calcified and noncalcified plaque are present in the distal left common carotid artery and through the bifurcation. The right external carotid artery demonstrates high-grade stenosis. Dense calcification is present along the lateral posterior margin of the proximal right ICA. The lumen is measured scratched at the lumen is narrowed to 1.4 mm. This compares with a more distal lumen of 4.5 mm. No significant stenosis is present in more distal right common carotid artery through the skull base. Left carotid system: The left common carotid artery is within normal limits proximally. Atherosclerotic changes are present in the wall of the distal left common carotid artery without significant stenosis. A high-grade, near occlusive stenosis is present in the proximal left ICA. The lumen is narrowed to less than 1 mm. More distal vessel measures 3.8 mm decreased in caliber compared to 4.5 mm on the right. Vertebral arteries: The vertebral arteries originate from the subclavian arteries bilaterally. The left vertebral artery is hypoplastic. Focal stenosis is present at the origin of the hypoplastic left vertebral artery. No significant stenosis is present on the right. A very small left V4 segment reaches the vertebrobasilar junction. Skeleton: Degenerative changes are present cervical spine with straightening of normal cervical lordosis. No focal lytic or blastic lesions are present. Other neck: Soft  tissues the neck are otherwise unremarkable. Upper chest: Lung apices are clear. Thoracic inlet is within normal limits. IMPRESSION: 1. High-grade, near occlusive stenosis of the proximal left ICA. The lumen is narrowed to less than 1 mm. More distal left ICA is also decreased in caliber relative to the distal right ICA. The findings are consistent with a high-grade stenosis. 2. 60% stenosis of the distal right common carotid artery. 3. High-grade stenosis of the right external carotid artery. 4. Focal stenosis at the origin of the hypoplastic left vertebral artery. 5. Aortic Atherosclerosis (ICD10-I70.0). 6. Multilevel degenerative changes within the cervical spine. Electronically Signed   By: San Morelle M.D.   On: 07/02/2020 11:10    Assessment/Plan Essential hypertension blood pressure control important in reducing the progression of atherosclerotic disease. On appropriate oral medications.   DM (diabetes mellitus), type 2, uncontrolled w/ophthalmic complication (HCC) blood glucose control important in reducing the progression of atherosclerotic disease. Also, involved in wound healing. On appropriate medications.  Hyperlipidemia lipid control important in reducing the progression of atherosclerotic disease. Continue statin therapy  Carotid artery stenosis After a long discussion today with her husband and the patient, she has decided to proceed with left carotid stent placement.  I have discussed the risks and benefits of  the procedure.  I have given her a prescription for Plavix 75 mg daily to go ahead and start.  She will need to be taking this at least 5 days prior to the procedure.  We will get her on the schedule for left carotid artery stent placement likely sometime next week.    Leotis Pain, MD  07/29/2020 5:25 PM    This note was created with Dragon medical transcription system.  Any errors from dictation are purely unintentional

## 2020-07-29 NOTE — Patient Instructions (Signed)
Carotid Angioplasty With Stent Carotid angioplasty with stent is a procedure to open or widen an artery in the neck (carotid artery) that has become narrowed. This is done by inflating a small balloon inside the artery and then placing a small piece of metal that looks like a coil or spring (stent) inside the artery. The stent helps keep the artery open by supporting the artery walls. The carotid arteries supply blood to the brain. When fats, cholesterol, and other materials (plaque) build up in an artery, the artery becomes narrow and can become blocked. This can reduce or block blood flow to certain areas of the brain, which can cause serious health problems, including stroke. Tell a health care provider about:  Any allergies you have.  All medicines you are taking, including vitamins, herbs, eye drops, creams, and over-the-counter medicines.  Any problems you or family members have had with anesthetic medicines.  Any blood disorders you have.  Any surgeries you have had.  Any medical conditions you have.  Whether you are pregnant or may be pregnant. What are the risks? Generally, this is a safe procedure. However, problems may occur, including:  Infection.  Bleeding.  Allergic reactions to medicines or dyes.  Damage to other structures or organs, or to the carotid artery itself.  The carotid artery becoming blocked again.  A collection of blood under the skin (hematoma) around the stent site that gets larger.  A blood clot in another part of the body.  Kidney injury.  Stroke.  Heart attack. What happens before the procedure?  Ask your health care provider about: ? Changing or stopping your regular medicines. This is especially important if you are taking diabetes medicines or blood thinners. ? Whether aspirin is recommended before this procedure. ? Taking over-the-counter medicines, vitamins, herbs, and supplements.  Follow instructions from your health care provider  about eating or drinking restrictions.  Do not use any products that contain nicotine or tobacco for 4 weeks before the procedure. These products include cigarettes, e-cigarettes, and chewing tobacco. If you need help quitting, ask your health care provider.  Ask your health care provider what steps will be taken to help prevent infection. These may include: ? Removing hair at the surgery site. ? Washing skin with a germ-killing soap. ? Taking antibiotic medicine.  You may have blood tests and imaging tests done.  Plan to have someone take you home from the hospital or clinic.  If you will be going home right after the procedure, plan to have someone with you for 24 hours. What happens during the procedure?   An IV will be inserted into one of your veins.  You may be given one or more of the following: ? A medicine to help you relax (sedative). ? A medicine to numb the area where the catheter will be inserted (local anesthetic).  Most commonly, an incision will be made in your groin. In some cases, an incision may be made in your wrist or forearm instead of your groin.  A small, thin tube (catheter) will be inserted through your incision, into an artery. The catheter will be threaded upward into your carotid artery. An X-ray machine (fluoroscope) will help your health care provider guide the catheter to the correct place in your artery.  Dye will be injected into the catheter and will travel to the narrow or blocked part of your carotid artery.  X-ray images will be taken of how the dye flows through your artery. While the images  are being taken, you may be given instructions about breathing, swallowing, moving, or talking.  A filter (distal protection device) will be inserted into your artery. This will be used to catch plaque that comes loose in your artery during the procedure. This reduces the risk of plaque moving into your brain.  A small balloon will be inserted into your  artery. The balloon will be inflated for a few seconds to widen your artery and will then be removed.  The stent will be placed in your artery.  A second small balloon will be inserted into your artery and inflated. This expands the stent inside of your artery so that the stent holds up the artery walls. The balloon will then be removed.  The catheter and the distal protection device will be removed from your artery.  Your incision may be closed with stitches (sutures), skin glue, or adhesive tape.  A bandage (dressing) will be placed over your incision. The procedure may vary among health care providers and hospitals. What happens after the procedure?  Your blood pressure, heart rate, breathing rate, and blood oxygen level will be monitored until you leave the hospital or clinic.  You may continue to receive fluids and medicines through an IV.  You may need to have pressure placed on the incision site to prevent bleeding.  You will need to keep the area still for a few hours, or as long as directed by your health care provider. If the procedure was done in the groin, you will be instructed not to bend or cross your legs.  You may have some pain. Pain medicines will be available to help you.  You may have a test that uses sound waves to take pictures (ultrasound) of the carotid artery. This can be compared to future tests to check for changes in the artery.  Do not drive for 24 hours. Summary  Carotid angioplasty with stent is a procedure to open or widen an artery in the neck (carotid artery) that has become narrowed.  The procedure is done to lower the risk of problems that can result from reduced blood flow to the brain, including a stroke.  The stent placed inside the artery will help keep the artery open by supporting the artery walls.  Follow instructions from your health care provider about taking medicines and about eating and drinking before the procedure. This  information is not intended to replace advice given to you by your health care provider. Make sure you discuss any questions you have with your health care provider. Document Revised: 09/26/2018 Document Reviewed: 09/21/2018 Elsevier Patient Education  2020 [employer].

## 2020-07-29 NOTE — Progress Notes (Signed)
MRN : [id number]  [name] is a 74 y.o. (1946-02-11) female who presents with chief complaint of  Chief Complaint  Patient presents with  . Follow-up    discuss surgery  .  History of Present Illness: Patient returns today in follow up of her carotid disease to again discuss surgery versus stenting.  She had not made her decision on which to proceed with at her last visit a couple of weeks ago, and had further questions.  She saw her cardiologist and they discussed the situation and she now is more interested in proceeding with carotid stent placement.  She does have significant comorbidities including her heart disease and lots of pain issues and is much more concerned about open surgical therapy.  I have previously reviewed her CT angiogram and we did again discuss the results today  Current Outpatient Medications  Medication Sig Dispense Refill  . amphetamine-dextroamphetamine (ADDERALL XR) 30 MG 24 hr capsule Take 30 mg by mouth daily.     Marland Kitchen b complex vitamins tablet Take 1 tablet by mouth daily.    Marland Kitchen BYSTOLIC 5 MG tablet TAKE 1 TABLET DAILY 90 tablet 0  . diphenoxylate-atropine (LOMOTIL) 2.5-0.025 MG tablet Take 1 tablet by mouth 4 (four) times daily as needed for diarrhea or loose stools.     . Dulaglutide 0.75 MG/0.5ML SOPN Inject into the skin.    Marland Kitchen EPINEPHrine 0.3 mg/0.3 mL IJ SOAJ injection Inject into the muscle.    . escitalopram (LEXAPRO) 20 MG tablet Take 20 mg by mouth daily.    Marland Kitchen glucose blood (BAYER CONTOUR NEXT TEST) test strip Use to test blood sugar 3 times daily as instructed. Dx code: E11.65 100 each 11  . hydrochlorothiazide (HYDRODIURIL) 12.5 MG tablet Take 12.5 mg by mouth daily.     . Lancets (ACCU-CHEK MULTICLIX) lancets Use to test blood sugar 3 times daily as instructed. Dx: E11.65 100 each 11  . lisinopril (PRINIVIL,ZESTRIL) 5 MG tablet TAKE 1 TABLET BY MOUTH EVERY DAY 90 tablet 0  . LORazepam (ATIVAN) 0.5 MG tablet Take 1 tablet (0.5 mg total) by  mouth daily as needed for anxiety. 20 tablet 0  . metFORMIN (GLUCOPHAGE-XR) 500 MG 24 hr tablet   0  . omeprazole (PRILOSEC) 40 MG capsule TK 1 C PO D  2  . pregabalin (LYRICA) 200 MG capsule     . rosuvastatin (CRESTOR) 5 MG tablet Take by mouth.    . sitaGLIPtin (JANUVIA) 100 MG tablet TAKE 1 TABLET BY MOUTH EVERY DAY.    Marland Kitchen BELVIQ XR 20 MG TB24 TAKE 1 TABLET BY MOUTH EVERY DAY IN THE MORNING  2  . calcium carbonate (TUMS - DOSED IN MG ELEMENTAL CALCIUM) 500 MG chewable tablet Chew 2 tablets (400 mg of elemental calcium total) by mouth 3 (three) times daily. (Patient not taking: Reported on 11/27/2019) 60 tablet 0  . cephALEXin (KEFLEX) 500 MG capsule Take 1 capsule (500 mg total) by mouth 2 (two) times daily. (Patient not taking: Reported on 11/27/2019) 10 capsule 0  . Cholecalciferol (VITAMIN D3) 1000 units CAPS Take by mouth.    . COMBIVENT RESPIMAT 20-100 MCG/ACT AERS respimat INL 1 INHALATION PO QID  0  . magnesium chloride (SLOW-MAG) 64 MG TBEC SR tablet Take 2 tablets (128 mg total) by mouth 2 (two) times daily. (Patient not taking: Reported on 11/27/2019) 30 tablet 0  . Multiple Vitamin (MULTIVITAMIN WITH MINERALS) TABS tablet Take 1 tablet by mouth daily. 30 tablet 0  .  potassium chloride SA (K-DUR,KLOR-CON) 20 MEQ tablet Take 1 tablet (20 mEq total) by mouth daily. (Patient not taking: Reported on 11/27/2019) 20 tablet 0  . potassium phosphate, monobasic, (K-PHOS ORIGINAL) 500 MG tablet Take 1 tablet (500 mg total) by mouth 4 (four) times daily -  with meals and at bedtime. 60 tablet 0   No current facility-administered medications for this visit.    Past Medical History:  Diagnosis Date  . Alcohol abuse   . CAP (community acquired pneumonia) 05/05/2018  . Carotid artery stenosis   . Depression   . Diabetes mellitus   . Diverticulitis   . Fibromyalgia   . Hepatitis 08/28/2014  . Hypertension   . Myalgia and myositis 03/28/2012    Past Surgical History:  Procedure Laterality  Date  . BUNIONECTOMY    . CESAREAN SECTION    . TONSILLECTOMY       Social History   Tobacco Use  . Smoking status: Former Smoker    Quit date: 03/11/2010    Years since quitting: 10.3  . Smokeless tobacco: Never Used  Substance Use Topics  . Alcohol use: Yes    Comment: 4-5 bottles of wine per day / 2-3 quarts per day  . Drug use: No    Comment: Pt denies       Family History  Problem Relation Age of Onset  . Cancer Mother   . Heart disease Father   . Heart disease Sister     Allergies  Allergen Reactions  . Latex Hives  . Lipitor [Atorvastatin Calcium] Other (See Comments)    Muscle aches  . Codeine Rash    And nausea   . Pentazocine Lactate Rash  . Sulfonamide Derivatives Rash    REVIEW OF SYSTEMS(Negative unless checked)  Constitutional: [] ????Weight loss[] ????Fever[] ????Chills Cardiac:[] ????Chest pain[x] ????Chest pressure[] ????Palpitations [] ????Shortness of breath when laying flat [] ????Shortness of breath at rest [x] ????Shortness of breath with exertion. Vascular: [] ????Pain in legs with walking[] ????Pain in legsat rest[] ????Pain in legs when laying flat [] ????Claudication [] ????Pain in feet when walking [] ????Pain in feet at rest [] ????Pain in feet when laying flat [] ????History of DVT [] ????Phlebitis [] ????Swelling in legs [] ????Varicose veins [] ????Non-healing ulcers Pulmonary: [] ????Uses home oxygen [] ????Productive cough[] ????Hemoptysis [] ????Wheeze [] ????COPD [] ????Asthma Neurologic: [] ????Dizziness [] ????Blackouts [] ????Seizures [] ????History of stroke [x] ????History of TIA[] ????Aphasia [] ????Temporary blindness[] ????Dysphagia [] ????Weaknessor numbness in arms [] ????Weakness or numbnessin legs Musculoskeletal: [] ????Arthritis [] ????Joint swelling [] ????Joint pain [] ????Low back pain Hematologic:[] ????Easy bruising[] ????Easy bleeding [] ????Hypercoagulable state  [] ????Anemic [] ????Hepatitis Gastrointestinal:[] ????Blood in stool[] ????Vomiting blood[] ????Gastroesophageal reflux/heartburn[] ????Abdominal pain Genitourinary: [] ????Chronic kidney disease [] ????Difficulturination [] ????Frequenturination [] ????Burning with urination[] ????Hematuria Skin: [] ????Rashes [] ????Ulcers [] ????Wounds Psychological: [x] ????History of anxiety[x] ????History of major depression.   Physical Examination  BP 117/65 (BP Location: Right Arm)   Pulse 78   Resp 16   Wt 155 lb (70.3 kg)   BMI 27.03 kg/m  Gen:  WD/WN, NAD Head: [place]/AT, No temporalis wasting. Ear/Nose/Throat: Hearing grossly intact, nares w/o erythema or drainage Eyes: Conjunctiva clear. Sclera non-icteric Neck: Supple.  Trachea midline Pulmonary:  Good air movement, no use of accessory muscles.  Cardiac: RRR, no JVD Vascular:  Vessel Right Left  Radial Palpable Palpable       Musculoskeletal: M/S 5/5 throughout.  No deformity or atrophy.  No significant edema. Neurologic: Sensation grossly intact in extremities.  Symmetrical.  Speech is fluent.  Psychiatric: Judgment intact, Mood & affect appropriate for pt's clinical situation. Dermatologic: No rashes or ulcers noted.  No cellulitis or open wounds.       Labs No results found for this or any previous visit (from the past 2160  hour(s)).  Radiology CT Angio Neck W/Cm &/Or Wo/Cm  Result Date: 07/02/2020 CLINICAL DATA:  Carotid artery stenosis. 80-99% stenosis of the left carotid artery by carotid duplex 06/03/2020, increased from 6 months prior. EXAM: CT ANGIOGRAPHY NECK TECHNIQUE: Multidetector CT imaging of the neck was performed using the standard protocol during bolus administration of intravenous contrast. Multiplanar CT image reconstructions and MIPs were obtained to evaluate the vascular anatomy. Carotid stenosis measurements (when applicable) are obtained utilizing NASCET criteria, using the distal internal  carotid diameter as the denominator. CONTRAST:  38mL OMNIPAQUE IOHEXOL 350 MG/ML SOLN COMPARISON:  Carotid Doppler ultrasound 06/03/20 and 11/27/2019 FINDINGS: Aortic arch: Atherosclerotic calcifications are present at the aortic arch and origins of the great vessels without significant stenosis. No aneurysm is present. Right carotid system: Atherosclerotic calcifications are present in the wall of the distal right common carotid artery. The lumen is narrowed to 3.2 mm compared with more distal measurement of 5.5 mm. Extensive calcified and noncalcified plaque are present in the distal left common carotid artery and through the bifurcation. The right external carotid artery demonstrates high-grade stenosis. Dense calcification is present along the lateral posterior margin of the proximal right ICA. The lumen is measured scratched at the lumen is narrowed to 1.4 mm. This compares with a more distal lumen of 4.5 mm. No significant stenosis is present in more distal right common carotid artery through the skull base. Left carotid system: The left common carotid artery is within normal limits proximally. Atherosclerotic changes are present in the wall of the distal left common carotid artery without significant stenosis. A high-grade, near occlusive stenosis is present in the proximal left ICA. The lumen is narrowed to less than 1 mm. More distal vessel measures 3.8 mm decreased in caliber compared to 4.5 mm on the right. Vertebral arteries: The vertebral arteries originate from the subclavian arteries bilaterally. The left vertebral artery is hypoplastic. Focal stenosis is present at the origin of the hypoplastic left vertebral artery. No significant stenosis is present on the right. A very small left V4 segment reaches the vertebrobasilar junction. Skeleton: Degenerative changes are present cervical spine with straightening of normal cervical lordosis. No focal lytic or blastic lesions are present. Other neck: Soft  tissues the neck are otherwise unremarkable. Upper chest: Lung apices are clear. Thoracic inlet is within normal limits. IMPRESSION: 1. High-grade, near occlusive stenosis of the proximal left ICA. The lumen is narrowed to less than 1 mm. More distal left ICA is also decreased in caliber relative to the distal right ICA. The findings are consistent with a high-grade stenosis. 2. 60% stenosis of the distal right common carotid artery. 3. High-grade stenosis of the right external carotid artery. 4. Focal stenosis at the origin of the hypoplastic left vertebral artery. 5. Aortic Atherosclerosis (ICD10-I70.0). 6. Multilevel degenerative changes within the cervical spine. Electronically Signed   By: San Morelle M.D.   On: 07/02/2020 11:10    Assessment/Plan Essential hypertension blood pressure control important in reducing the progression of atherosclerotic disease. On appropriate oral medications.   DM (diabetes mellitus), type 2, uncontrolled w/ophthalmic complication (HCC) blood glucose control important in reducing the progression of atherosclerotic disease. Also, involved in wound healing. On appropriate medications.  Hyperlipidemia lipid control important in reducing the progression of atherosclerotic disease. Continue statin therapy  Carotid artery stenosis After a long discussion today with her husband and the patient, she has decided to proceed with left carotid stent placement.  I have discussed the risks and benefits of  the procedure.  I have given her a prescription for Plavix 75 mg daily to go ahead and start.  She will need to be taking this at least 5 days prior to the procedure.  We will get her on the schedule for left carotid artery stent placement likely sometime next week.    Leotis Pain, MD  07/29/2020 5:25 PM    This note was created with Dragon medical transcription system.  Any errors from dictation are purely unintentional

## 2020-07-31 ENCOUNTER — Telehealth (INDEPENDENT_AMBULATORY_CARE_PROVIDER_SITE_OTHER): Payer: Self-pay

## 2020-07-31 NOTE — Telephone Encounter (Signed)
Spoke with the patient and she is now scheduled with Dr. Lucky Cowboy for a Left Carotid Stent placement on 08/07/20 with a 6:45 am arrival time to the [facility]. Covid testing on 08/05/20 between 8-1 pm at the [facility]. Pre-procedure instructions were discussed and will be mailed.

## 2020-08-05 ENCOUNTER — Other Ambulatory Visit
Admission: RE | Admit: 2020-08-05 | Discharge: 2020-08-05 | Disposition: A | Discharge status: Home / Self Care | Payer: Medicare Other | Source: Ambulatory Visit | Attending: Vascular Surgery | Admitting: Vascular Surgery

## 2020-08-05 ENCOUNTER — Other Ambulatory Visit: Payer: Self-pay

## 2020-08-05 DIAGNOSIS — Z20822 Contact with and (suspected) exposure to covid-19: Secondary | ICD-10-CM | POA: Insufficient documentation

## 2020-08-05 DIAGNOSIS — Z01812 Encounter for preprocedural laboratory examination: Secondary | ICD-10-CM | POA: Insufficient documentation

## 2020-08-06 ENCOUNTER — Other Ambulatory Visit (INDEPENDENT_AMBULATORY_CARE_PROVIDER_SITE_OTHER): Payer: Self-pay | Admitting: Nurse Practitioner

## 2020-08-06 LAB — SARS CORONAVIRUS 2 (TAT 6-24 HRS): SARS Coronavirus 2: NEGATIVE

## 2020-08-07 ENCOUNTER — Other Ambulatory Visit: Payer: Self-pay

## 2020-08-07 ENCOUNTER — Encounter: Payer: Self-pay | Admitting: Vascular Surgery

## 2020-08-07 ENCOUNTER — Encounter
Admission: RE | Disposition: A | Discharge status: Home / Self Care | Payer: Self-pay | Source: Home / Self Care | Attending: Vascular Surgery

## 2020-08-07 ENCOUNTER — Inpatient Hospital Stay
Admission: RE | Admit: 2020-08-07 | Discharge: 2020-08-08 | DRG: 036 | Disposition: A | Discharge status: Home / Self Care | Payer: Medicare Other | Attending: Vascular Surgery | Admitting: Vascular Surgery

## 2020-08-07 DIAGNOSIS — M797 Fibromyalgia: Secondary | ICD-10-CM | POA: Diagnosis present

## 2020-08-07 DIAGNOSIS — I10 Essential (primary) hypertension: Secondary | ICD-10-CM | POA: Diagnosis present

## 2020-08-07 DIAGNOSIS — I6522 Occlusion and stenosis of left carotid artery: Secondary | ICD-10-CM

## 2020-08-07 DIAGNOSIS — Z885 Allergy status to narcotic agent status: Secondary | ICD-10-CM

## 2020-08-07 DIAGNOSIS — I6523 Occlusion and stenosis of bilateral carotid arteries: Secondary | ICD-10-CM | POA: Diagnosis present

## 2020-08-07 DIAGNOSIS — Z882 Allergy status to sulfonamides status: Secondary | ICD-10-CM

## 2020-08-07 DIAGNOSIS — Z888 Allergy status to other drugs, medicaments and biological substances status: Secondary | ICD-10-CM

## 2020-08-07 DIAGNOSIS — Z79899 Other long term (current) drug therapy: Secondary | ICD-10-CM | POA: Diagnosis not present

## 2020-08-07 DIAGNOSIS — Z91040 Latex allergy status: Secondary | ICD-10-CM | POA: Diagnosis not present

## 2020-08-07 DIAGNOSIS — Z20822 Contact with and (suspected) exposure to covid-19: Secondary | ICD-10-CM | POA: Diagnosis present

## 2020-08-07 DIAGNOSIS — E785 Hyperlipidemia, unspecified: Secondary | ICD-10-CM | POA: Diagnosis present

## 2020-08-07 DIAGNOSIS — Z7984 Long term (current) use of oral hypoglycemic drugs: Secondary | ICD-10-CM

## 2020-08-07 DIAGNOSIS — Z87891 Personal history of nicotine dependence: Secondary | ICD-10-CM | POA: Diagnosis not present

## 2020-08-07 DIAGNOSIS — F329 Major depressive disorder, single episode, unspecified: Secondary | ICD-10-CM | POA: Diagnosis present

## 2020-08-07 DIAGNOSIS — E1165 Type 2 diabetes mellitus with hyperglycemia: Secondary | ICD-10-CM | POA: Diagnosis present

## 2020-08-07 HISTORY — PX: CAROTID PTA/STENT INTERVENTION: CATH118231

## 2020-08-07 LAB — MRSA PCR SCREENING: MRSA by PCR: NEGATIVE

## 2020-08-07 LAB — POCT ACTIVATED CLOTTING TIME: Activated Clotting Time: 224 seconds

## 2020-08-07 LAB — GLUCOSE, CAPILLARY
Glucose-Capillary: 174 mg/dL — ABNORMAL HIGH (ref 70–99)
Glucose-Capillary: 150 mg/dL — ABNORMAL HIGH (ref 70–99)
Glucose-Capillary: 127 mg/dL — ABNORMAL HIGH (ref 70–99)

## 2020-08-07 SURGERY — CAROTID PTA/STENT INTERVENTION
Anesthesia: Moderate Sedation | Laterality: Left

## 2020-08-07 MED ORDER — ACETAMINOPHEN 325 MG RE SUPP
325.0000 mg | RECTAL | Status: DC | PRN
  Filled 2020-08-07: qty 2

## 2020-08-07 MED ORDER — CLOPIDOGREL BISULFATE 75 MG PO TABS: 75.0000 mg | ORAL_TABLET | Freq: Every day | ORAL | Status: DC

## 2020-08-07 MED ORDER — POTASSIUM CHLORIDE CRYS ER 20 MEQ PO TBCR: 20.0000 meq | EXTENDED_RELEASE_TABLET | Freq: Every day | ORAL | Status: DC | PRN

## 2020-08-07 MED ORDER — CEFAZOLIN SODIUM-DEXTROSE 2-4 GM/100ML-% IV SOLN
INTRAVENOUS | Status: AC
  Administered 2020-08-07: 2 g via INTRAVENOUS
  Filled 2020-08-07: qty 100

## 2020-08-07 MED ORDER — FENTANYL CITRATE (PF) 100 MCG/2ML IJ SOLN
INTRAMUSCULAR | Status: AC
  Filled 2020-08-07: qty 2

## 2020-08-07 MED ORDER — NEBIVOLOL HCL 5 MG PO TABS
5.0000 mg | ORAL_TABLET | Freq: Every day | ORAL | Status: DC
  Filled 2020-08-07: qty 1

## 2020-08-07 MED ORDER — ESCITALOPRAM OXALATE 10 MG PO TABS
20.0000 mg | ORAL_TABLET | Freq: Every day | ORAL | Status: DC
  Filled 2020-08-07: qty 2

## 2020-08-07 MED ORDER — SODIUM CHLORIDE 0.9 % IV BOLUS
500.0000 mL | Freq: Once | INTRAVENOUS | Status: AC
  Administered 2020-08-07: 500 mL via INTRAVENOUS

## 2020-08-07 MED ORDER — SODIUM CHLORIDE 0.9 % IV SOLN
500.0000 mL | Freq: Once | INTRAVENOUS | Status: AC | PRN
  Administered 2020-08-07: 500 mL via INTRAVENOUS

## 2020-08-07 MED ORDER — ATROPINE SULFATE 1 MG/10ML IJ SOSY
PREFILLED_SYRINGE | INTRAMUSCULAR | Status: AC
  Filled 2020-08-07: qty 20

## 2020-08-07 MED ORDER — CEFAZOLIN SODIUM-DEXTROSE 2-4 GM/100ML-% IV SOLN: 2.0000 g | Freq: Three times a day (TID) | INTRAVENOUS | Status: DC

## 2020-08-07 MED ORDER — CEFAZOLIN SODIUM-DEXTROSE 2-4 GM/100ML-% IV SOLN: 2.0000 g | Freq: Once | INTRAVENOUS | Status: AC

## 2020-08-07 MED ORDER — MORPHINE SULFATE (PF) 4 MG/ML IV SOLN: 2.0000 mg | INTRAVENOUS | Status: DC | PRN

## 2020-08-07 MED ORDER — DIPHENOXYLATE-ATROPINE 2.5-0.025 MG PO TABS: 1.0000 | ORAL_TABLET | Freq: Four times a day (QID) | ORAL | Status: DC | PRN

## 2020-08-07 MED ORDER — SODIUM CHLORIDE 0.9 % IV SOLN
INTRAVENOUS | Status: AC | PRN
  Administered 2020-08-07: 250 mL via INTRAVENOUS

## 2020-08-07 MED ORDER — LISINOPRIL 10 MG PO TABS
5.0000 mg | ORAL_TABLET | Freq: Every day | ORAL | Status: DC
  Filled 2020-08-07: qty 0.5

## 2020-08-07 MED ORDER — FENTANYL CITRATE (PF) 100 MCG/2ML IJ SOLN: 12.5000 ug | Freq: Once | INTRAMUSCULAR | Status: DC | PRN

## 2020-08-07 MED ORDER — MAGNESIUM SULFATE 2 GM/50ML IV SOLN
2.0000 g | Freq: Every day | INTRAVENOUS | Status: DC | PRN
  Filled 2020-08-07: qty 50

## 2020-08-07 MED ORDER — B COMPLEX-C PO TABS
1.0000 | ORAL_TABLET | Freq: Every day | ORAL | Status: DC
  Filled 2020-08-07: qty 1

## 2020-08-07 MED ORDER — PHENYLEPHRINE HCL (PRESSORS) 10 MG/ML IV SOLN
INTRAVENOUS | Status: AC
  Filled 2020-08-07: qty 1

## 2020-08-07 MED ORDER — ALUM & MAG HYDROXIDE-SIMETH 200-200-20 MG/5ML PO SUSP: 15.0000 mL | ORAL | Status: DC | PRN

## 2020-08-07 MED ORDER — MIDAZOLAM HCL 2 MG/2ML IJ SOLN
INTRAMUSCULAR | Status: DC | PRN
  Administered 2020-08-07: 2 mg via INTRAVENOUS
  Administered 2020-08-07: 1 mg via INTRAVENOUS

## 2020-08-07 MED ORDER — HEPARIN SODIUM (PORCINE) 1000 UNIT/ML IJ SOLN
INTRAMUSCULAR | Status: AC
  Filled 2020-08-07: qty 1

## 2020-08-07 MED ORDER — OXYCODONE-ACETAMINOPHEN 5-325 MG PO TABS: 1.0000 | ORAL_TABLET | ORAL | Status: DC | PRN

## 2020-08-07 MED ORDER — PREGABALIN 50 MG PO CAPS
100.0000 mg | ORAL_CAPSULE | Freq: Two times a day (BID) | ORAL | Status: DC
  Administered 2020-08-07: 100 mg via ORAL
  Filled 2020-08-07: qty 2

## 2020-08-07 MED ORDER — ADULT MULTIVITAMIN W/MINERALS CH: 1.0000 | ORAL_TABLET | Freq: Every day | ORAL | Status: DC

## 2020-08-07 MED ORDER — LORAZEPAM 0.5 MG PO TABS: 0.5000 mg | ORAL_TABLET | Freq: Every day | ORAL | Status: DC | PRN

## 2020-08-07 MED ORDER — IODIXANOL 320 MG/ML IV SOLN
INTRAVENOUS | Status: DC | PRN
  Administered 2020-08-07: 40 mL via INTRA_ARTERIAL

## 2020-08-07 MED ORDER — ONDANSETRON HCL 4 MG/2ML IJ SOLN: 4.0000 mg | Freq: Four times a day (QID) | INTRAMUSCULAR | Status: DC | PRN

## 2020-08-07 MED ORDER — MIDAZOLAM HCL 2 MG/ML PO SYRP: 8.0000 mg | ORAL_SOLUTION | Freq: Once | ORAL | Status: DC | PRN

## 2020-08-07 MED ORDER — MIDAZOLAM HCL 5 MG/5ML IJ SOLN
INTRAMUSCULAR | Status: AC
  Filled 2020-08-07: qty 5

## 2020-08-07 MED ORDER — ASPIRIN EC 81 MG PO TBEC: 81.0000 mg | DELAYED_RELEASE_TABLET | Freq: Every day | ORAL | Status: DC

## 2020-08-07 MED ORDER — HYDRALAZINE HCL 20 MG/ML IJ SOLN: 5.0000 mg | INTRAMUSCULAR | Status: DC | PRN

## 2020-08-07 MED ORDER — SODIUM CHLORIDE 0.9 % IV SOLN: INTRAVENOUS | Status: DC

## 2020-08-07 MED ORDER — OXYCODONE-ACETAMINOPHEN 5-325 MG PO TABS: 1.0000 | ORAL_TABLET | Freq: Four times a day (QID) | ORAL | Status: DC | PRN

## 2020-08-07 MED ORDER — DIPHENHYDRAMINE HCL 50 MG/ML IJ SOLN: 50.0000 mg | Freq: Once | INTRAMUSCULAR | Status: DC | PRN

## 2020-08-07 MED ORDER — METHYLPREDNISOLONE SODIUM SUCC 125 MG IJ SOLR: 125.0000 mg | Freq: Once | INTRAMUSCULAR | Status: DC | PRN

## 2020-08-07 MED ORDER — GUAIFENESIN-DM 100-10 MG/5ML PO SYRP: 15.0000 mL | ORAL_SOLUTION | ORAL | Status: DC | PRN

## 2020-08-07 MED ORDER — LINAGLIPTIN 5 MG PO TABS
5.0000 mg | ORAL_TABLET | Freq: Every day | ORAL | Status: DC
  Filled 2020-08-07: qty 1

## 2020-08-07 MED ORDER — FAMOTIDINE 20 MG PO TABS: 40.0000 mg | ORAL_TABLET | Freq: Once | ORAL | Status: DC | PRN

## 2020-08-07 MED ORDER — ATROPINE SULFATE 1 MG/10ML IJ SOSY
PREFILLED_SYRINGE | INTRAMUSCULAR | Status: DC | PRN
  Administered 2020-08-07: 1 mg via INTRAVENOUS

## 2020-08-07 MED ORDER — ACETAMINOPHEN 325 MG PO TABS: 325.0000 mg | ORAL_TABLET | ORAL | Status: DC | PRN

## 2020-08-07 MED ORDER — FENTANYL CITRATE (PF) 100 MCG/2ML IJ SOLN
INTRAMUSCULAR | Status: DC | PRN
Start: 2020-08-07 — End: 2020-08-07
  Administered 2020-08-07: 25 ug via INTRAVENOUS
  Administered 2020-08-07: 50 ug via INTRAVENOUS

## 2020-08-07 MED ORDER — DIPHENHYDRAMINE HCL 50 MG/ML IJ SOLN
INTRAMUSCULAR | Status: AC
  Filled 2020-08-07: qty 1

## 2020-08-07 MED ORDER — CHLORHEXIDINE GLUCONATE CLOTH 2 % EX PADS: 6.0000 | MEDICATED_PAD | Freq: Every day | CUTANEOUS | Status: DC

## 2020-08-07 MED ORDER — METOPROLOL TARTRATE 5 MG/5ML IV SOLN: 2.0000 mg | INTRAVENOUS | Status: DC | PRN

## 2020-08-07 MED ORDER — AMPHETAMINE-DEXTROAMPHET ER 30 MG PO CP24: 30.0000 mg | ORAL_CAPSULE | Freq: Every day | ORAL | Status: DC

## 2020-08-07 MED ORDER — FAMOTIDINE IN NACL 20-0.9 MG/50ML-% IV SOLN: 20.0000 mg | Freq: Two times a day (BID) | INTRAVENOUS | Status: DC

## 2020-08-07 MED ORDER — ROSUVASTATIN CALCIUM 20 MG PO TABS
20.0000 mg | ORAL_TABLET | Freq: Every day | ORAL | Status: DC
  Filled 2020-08-07: qty 1

## 2020-08-07 MED ORDER — DIPHENHYDRAMINE HCL 50 MG/ML IJ SOLN
INTRAMUSCULAR | Status: DC | PRN
  Administered 2020-08-07: 50 mg via INTRAVENOUS

## 2020-08-07 MED ORDER — PANTOPRAZOLE SODIUM 40 MG PO TBEC: 40.0000 mg | DELAYED_RELEASE_TABLET | Freq: Every day | ORAL | Status: DC

## 2020-08-07 MED ORDER — PHENOL 1.4 % MT LIQD
1.0000 | OROMUCOSAL | Status: DC | PRN
  Filled 2020-08-07: qty 177

## 2020-08-07 MED ORDER — SODIUM CHLORIDE 0.9 % IV SOLN: Freq: Once | INTRAVENOUS | Status: AC

## 2020-08-07 MED ORDER — HEPARIN SODIUM (PORCINE) 1000 UNIT/ML IJ SOLN
INTRAMUSCULAR | Status: DC | PRN
  Administered 2020-08-07: 7000 [IU] via INTRAVENOUS
  Administered 2020-08-07: 2000 [IU] via INTRAVENOUS

## 2020-08-07 MED ORDER — CEFAZOLIN SODIUM-DEXTROSE 2-4 GM/100ML-% IV SOLN
2.0000 g | Freq: Three times a day (TID) | INTRAVENOUS | Status: AC
  Administered 2020-08-07 – 2020-08-08 (×2): 2 g via INTRAVENOUS
  Filled 2020-08-07 (×2): qty 100

## 2020-08-07 MED ORDER — DOPAMINE-DEXTROSE 3.2-5 MG/ML-% IV SOLN
INTRAVENOUS | Status: AC
  Filled 2020-08-07: qty 250

## 2020-08-07 MED ORDER — LABETALOL HCL 5 MG/ML IV SOLN: 10.0000 mg | INTRAVENOUS | Status: DC | PRN

## 2020-08-07 SURGICAL SUPPLY — 19 items
BALLN VTRAC 4.5X30X135 (BALLOONS) ×2
BALLOON VTRAC 4.5X30X135 (BALLOONS) IMPLANT
CATH ANGIO 5F 100CM .035 PIG (CATHETERS) ×1 IMPLANT
CATH BEACON 5 .035 100 H1 TIP (CATHETERS) ×1 IMPLANT
COVER PROBE U/S 5X48 (MISCELLANEOUS) ×1 IMPLANT
DEVICE EMBOSHIELD NAV6 4.0-7.0 (FILTER) ×1 IMPLANT
DEVICE INFLAT 30 PLUS (MISCELLANEOUS) ×1 IMPLANT
DEVICE STARCLOSE SE CLOSURE (Vascular Products) ×1 IMPLANT
DEVICE TORQUE .025-.038 (MISCELLANEOUS) ×1 IMPLANT
GLIDEWIRE ANGLED SS 035X260CM (WIRE) ×1 IMPLANT
KIT CAROTID MANIFOLD (MISCELLANEOUS) ×1 IMPLANT
PACK ANGIOGRAPHY (CUSTOM PROCEDURE TRAY) ×2 IMPLANT
SHEATH BRITE TIP 5FRX11 (SHEATH) ×1 IMPLANT
SHEATH SHUTTLE 6FRX80 (SHEATH) ×1 IMPLANT
STENT XACT CAR 9-7X40X136 (Permanent Stent) ×1 IMPLANT
SYR MEDRAD MARK 7 150ML (SYRINGE) ×1 IMPLANT
TUBING CONTRAST HIGH PRESS 72 (TUBING) ×1 IMPLANT
WIRE G VAS 035X260 STIFF (WIRE) ×1 IMPLANT
WIRE J 3MM .035X145CM (WIRE) ×1 IMPLANT

## 2020-08-07 NOTE — Interval H&P Note (Signed)
History and Physical Interval Note:  08/07/2020 8:13 AM  [name]  has presented today for surgery, with the diagnosis of LT Carotid Stent Placement   ABBOTT  Carotid artery stenosis Covid Aug 24 cc:  Judi Cong.  The various methods of treatment have been discussed with the patient and family. After consideration of risks, benefits and other options for treatment, the patient has consented to  Procedure(s): CAROTID PTA/STENT INTERVENTION (Left) as a surgical intervention.  The patient's history has been reviewed, patient examined, no change in status, stable for surgery.  I have reviewed the patient's chart and labs.  Questions were answered to the patient's satisfaction.     Leotis Pain

## 2020-08-07 NOTE — Op Note (Signed)
OPERATIVE NOTE DATE: 08/07/2020  PROCEDURE: 1.  Ultrasound guidance for vascular access right femoral artery 2.  Placement of a 9 mm proximal, 7 mm distal, 4 cm long Exact stent with the use of the NAV-6 embolic protection device in the left carotid artery  PRE-OPERATIVE DIAGNOSIS: 1. High grade left carotid artery stenosis. 2. Previous TIA x 2  POST-OPERATIVE DIAGNOSIS:  Same as above  SURGEON: Leotis Pain, MD  ASSISTANT(S):  none  ANESTHESIA: local/MCS  ESTIMATED BLOOD LOSS:  25 cc  CONTRAST: 40 cc  FLUORO TIME: 5.8 minutes  MODERATE CONSCIOUS SEDATION TIME:  Approximately 30 minutes using 3 mg of Versed and 75 mcg of Fentanyl  FINDING(S): 1.   90% left carotid artery stenosis  SPECIMEN(S):   none  INDICATIONS:   Patient is a 74 y.o. female who presents with >80% left carotid artery stenosis.  The patient has previous TIAs and a relatively high carotid bifurcation that would make surgery somewhat difficult with the lesion tracking well above the bifurcation and carotid artery stenting was felt to be preferred to endarterectomy for that reason.  Risks and benefits were discussed and informed consent was obtained.   DESCRIPTION: After obtaining full informed written consent, the patient was brought back to the vascular suite and placed supine upon the table.  The patient received IV antibiotics prior to induction. Moderate conscious sedation was administered during a face to face encounter with the patient throughout the procedure with my supervision of the RN administering medicines and monitoring the patients vital signs and mental status throughout from the start of the procedure until the patient was taken to the recovery room.  After obtaining adequate anesthesia, the patient was prepped and draped in the standard fashion.   The right femoral artery was visualized with ultrasound and found to be widely patent. It was then accessed under direct ultrasound guidance without  difficulty with a Seldinger needle. A permanent image was recorded. A J-wire was placed and we then placed a 6 French sheath. The patient was then heparinized and a total of 9000 units of intravenous heparin were given and an ACT was checked to confirm successful anticoagulation. A pigtail catheter was then placed into the ascending aorta. 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 I then selectively cannulated the left common carotid artery without difficulty with a headhunter catheter and advanced into the mid left common carotid artery.  Cervical and cerebral carotid angiography was then performed. There were no obvious intracranial filling defects with no cross-filling. The carotid bifurcation demonstrated a 90% left carotid artery stenosis above the bifurcation tracking about 1 to 2 cm above the carotid bifurcation and then normalizing.  I then advanced into the external carotid artery with a Glidewire and the headhunter catheter and then exchanged for the Amplatz Super Stiff wire. Over the Amplatz Super Stiff wire, a 6 [place] shuttle sheath was placed into the mid common carotid artery. I then used the NAV-6  Embolic protection device and crossed the lesion and parked this in the distal internal carotid artery at the base of the skull.  I then selected a 9 mm proximal, 7 mm distal, 4 cm long exact stent. This was deployed across the lesion encompassing it in its entirety. A 4.5 mm diameter by 3 cm length balloon was used to post dilate the stent. Only about a 10% residual stenosis was present after angioplasty. Completion angiogram showed normal intracranial filling without new defects. At this point  I elected to terminate the procedure. The sheath was removed and StarClose closure device was deployed in the right femoral artery with excellent hemostatic result. The patient was taken to the recovery room in stable condition having tolerated the procedure well.  COMPLICATIONS:  none  CONDITION: stable  Leotis Pain 08/07/2020 9:34 AM   This note was created with Dragon Medical transcription system. Any errors in dictation are purely unintentional.

## 2020-08-08 DIAGNOSIS — I6522 Occlusion and stenosis of left carotid artery: Secondary | ICD-10-CM

## 2020-08-08 LAB — BASIC METABOLIC PANEL
Anion gap: 13 (ref 5–15)
BUN: 10 mg/dL (ref 8–23)
CO2: 34 mmol/L — ABNORMAL HIGH (ref 22–32)
Calcium: 9.3 mg/dL (ref 8.9–10.3)
Chloride: 112 mmol/L — ABNORMAL HIGH (ref 98–111)
Creatinine, Ser: 0.6 mg/dL (ref 0.44–1.00)
GFR calc Af Amer: >60 mL/min (ref >60)
GFR calc non Af Amer: >60 mL/min (ref >60)
Glucose, Bld: 130 mg/dL — ABNORMAL HIGH (ref 70–99)
Potassium: 5 mmol/L (ref 3.5–5.1)
Sodium: 139 mmol/L (ref 135–145)

## 2020-08-08 LAB — CBC
HCT: 32.2 % — ABNORMAL LOW (ref 36.0–46.0)
Hemoglobin: 10.8 g/dL — ABNORMAL LOW (ref 12.0–15.0)
MCH: 30 pg (ref 26.0–34.0)
MCHC: 33.5 g/dL (ref 30.0–36.0)
MCV: 89.4 fL (ref 80.0–100.0)
Platelets: 244 10*3/uL (ref 150–400)
RBC: 3.6 MIL/uL — ABNORMAL LOW (ref 3.87–5.11)
RDW: 13.3 % (ref 11.5–15.5)
WBC: 7.1 10*3/uL (ref 4.0–10.5)
nRBC: 0 % (ref 0.0–0.2)

## 2020-08-08 LAB — MAGNESIUM: Magnesium: 1.7 mg/dL (ref 1.7–2.4)

## 2020-08-08 MED ORDER — CLOPIDOGREL BISULFATE 75 MG PO TABS
75.0000 mg | ORAL_TABLET | Freq: Every day | ORAL | 3 refills | Status: DC
Start: 2020-08-08 — End: 2020-12-01

## 2020-08-08 MED ORDER — ASPIRIN 81 MG PO TBEC: 81.0000 mg | DELAYED_RELEASE_TABLET | Freq: Every day | ORAL | 3 refills | Status: AC

## 2020-08-08 NOTE — Plan of Care (Signed)
  Problem: Education: Goal: Knowledge of General Education information will improve Description: Including pain rating scale, medication(s)/side effects and non-pharmacologic comfort measures 08/08/2020 1100 by Jesse Sans, RN Outcome: Adequate for Discharge 08/08/2020 2297 by Jesse Sans, RN Outcome: Progressing   Problem: Health Behavior/Discharge Planning: Goal: Ability to manage health-related needs will improve 08/08/2020 1100 by Jesse Sans, RN Outcome: Adequate for Discharge 08/08/2020 [id number] by Jesse Sans, RN Outcome: Progressing   Problem: Clinical Measurements: Goal: Ability to maintain clinical measurements within normal limits will improve 08/08/2020 1100 by Jesse Sans, RN Outcome: Adequate for Discharge 08/08/2020 1194 by Jesse Sans, RN Outcome: Progressing Goal: Will remain free from infection 08/08/2020 1100 by Jesse Sans, RN Outcome: Adequate for Discharge 08/08/2020 1740 by Jesse Sans, RN Outcome: Progressing Goal: Diagnostic test results will improve 08/08/2020 1100 by Jesse Sans, RN Outcome: Adequate for Discharge 08/08/2020 8144 by Jesse Sans, RN Outcome: Progressing Goal: Respiratory complications will improve 08/08/2020 1100 by Jesse Sans, RN Outcome: Adequate for Discharge 08/08/2020 8185 by Jesse Sans, RN Outcome: Progressing Goal: Cardiovascular complication will be avoided 08/08/2020 1100 by Jesse Sans, RN Outcome: Adequate for Discharge 08/08/2020 6314 by Jesse Sans, RN Outcome: Progressing   Problem: Activity: Goal: Risk for activity intolerance will decrease 08/08/2020 1100 by Jesse Sans, RN Outcome: Adequate for Discharge 08/08/2020 9702 by Jesse Sans, RN Outcome: Progressing   Problem: Nutrition: Goal: Adequate nutrition will be maintained 08/08/2020 1100 by Jesse Sans, RN Outcome: Adequate for Discharge 08/08/2020 6378 by Jesse Sans, RN Outcome: Progressing    Problem: Coping: Goal: Level of anxiety will decrease 08/08/2020 1100 by Jesse Sans, RN Outcome: Adequate for Discharge 08/08/2020 5885 by Jesse Sans, RN Outcome: Progressing   Problem: Elimination: Goal: Will not experience complications related to bowel motility 08/08/2020 1100 by Jesse Sans, RN Outcome: Adequate for Discharge 08/08/2020 0277 by Jesse Sans, RN Outcome: Progressing Goal: Will not experience complications related to urinary retention 08/08/2020 1100 by Jesse Sans, RN Outcome: Adequate for Discharge 08/08/2020 4128 by Jesse Sans, RN Outcome: Progressing   Problem: Pain Managment: Goal: General experience of comfort will improve 08/08/2020 1100 by Jesse Sans, RN Outcome: Adequate for Discharge 08/08/2020 7867 by Jesse Sans, RN Outcome: Progressing   Problem: Safety: Goal: Ability to remain free from injury will improve 08/08/2020 1100 by Jesse Sans, RN Outcome: Adequate for Discharge 08/08/2020 6720 by Jesse Sans, RN Outcome: Progressing

## 2020-08-08 NOTE — Discharge Summary (Signed)
[facility]    Discharge Summary  Patient ID:  [name] MRN: [id number] DOB/AGE: [id number] 72 y.o.  Admit date: 08/07/2020 Discharge date: 08/08/2020 Date of Surgery: 08/07/2020 Surgeon: Surgeon(s): Algernon Huxley, MD  Admission Diagnosis: Carotid stenosis, bilateral [I65.23]  Discharge Diagnoses:  Carotid stenosis, bilateral [I65.23]  Secondary Diagnoses: Past Medical History:  Diagnosis Date   Alcohol abuse    CAP (community acquired pneumonia) 05/05/2018   Carotid artery stenosis    Depression    Diabetes mellitus    Diverticulitis    Fibromyalgia    Hepatitis 08/28/2014   Hypertension    Myalgia and myositis 03/28/2012   Procedure(s): (08/07/20) 1.  Ultrasound guidance for vascular access right femoral artery 2.  Placement of a 9 mm proximal, 7 mm distal, 4 cm long Exact stent with the use of the NAV-6 embolic protection device in the left carotid artery  Discharged Condition: Good  HPI / Hospital Course:  Patient is a 74 y.o. female who presents with >80% left carotid artery stenosis.  The patient has previous TIAs and a relatively high carotid bifurcation that would make surgery somewhat difficult with the lesion tracking well above the bifurcation and carotid artery stenting was felt to be preferred to endarterectomy for that reason.  Risks and benefits were discussed and informed consent was obtained. On 08/07/20, the patient underwent:  1) Ultrasound guidance for vascular access right femoral artery 2) Placement of a 9 mm proximal, 7 mm distal, 4 cm long Exact stent with the use of the NAV-6 embolic protection device in the left carotid artery  The patient tolerated the procedure well was transferred from the recovery room to the ICU for observation overnight.  The patient's that of surgery was unremarkable.  During her brief inpatient stay, her diet was advanced, she was urinating independently, her pain was controlled with the  use of p.o. pain medication she was ambulating at baseline.  Day of discharge, the patient was afebrile with stable vital signs and unremarkable physical exam.  Physical exam: Alert and oriented x3, no acute distress Face: Symmetrical, tongue midline Neck: Trachea midline, no swelling or ecchymosis noted Cardiovascular: Regular rate and rhythm Pulmonary: Clear to auscultation bilaterally Abdomen: Soft, nontender, nondistended Extremity: Bilateral lower extremities are warm distally to toes.  Good capillary refill.  Minimal edema.  Motor/sensory is intact Neurological: Cranial nerves intact, no deficits  Labs: As below  Complications: None  Consults: None  Significant Diagnostic Studies: CBC Lab Results  Component Value Date   WBC 7.1 08/08/2020   HGB 10.8 (L) 08/08/2020   HCT 32.2 (L) 08/08/2020   MCV 89.4 08/08/2020   PLT 244 08/08/2020   BMET    Component Value Date/Time   NA 139 08/08/2020 0803   K 5.0 08/08/2020 0803   CL 112 (H) 08/08/2020 0803   CO2 34 (H) 08/08/2020 0803   GLUCOSE 130 (H) 08/08/2020 0803   GLUCOSE 123 (H) 11/17/2006 1240   BUN 10 08/08/2020 0803   CREATININE 0.60 08/08/2020 0803   CREATININE 0.81 09/16/2014 1759   CALCIUM 9.3 08/08/2020 0803   GFRNONAA >60 08/08/2020 0803   GFRNONAA 76 09/16/2014 1759   GFRAA >60 08/08/2020 0803   GFRAA 88 09/16/2014 1759   COAG Lab Results  Component Value Date   INR 1.46 01/24/2016   INR 1.05 08/28/2014   INR 0.95 06/08/2014   Disposition:  Discharge to :Home  Allergies as of 08/08/2020      Reactions  Latex Hives   Lipitor [atorvastatin Calcium] Other (See Comments)   Muscle aches   Codeine Rash   And nausea   Pentazocine Lactate Rash   Sulfonamide Derivatives Rash      Medication List    STOP taking these medications   Belviq XR 20 MG Tb24 Generic drug: Lorcaserin HCl ER   calcium carbonate 500 MG chewable tablet Commonly known as: TUMS - dosed in mg elemental calcium   cephALEXin  500 MG capsule Commonly known as: KEFLEX   Combivent Respimat 20-100 MCG/ACT Aers respimat Generic drug: Ipratropium-Albuterol   hydrochlorothiazide 12.5 MG tablet Commonly known as: HYDRODIURIL   magnesium chloride 64 MG Tbec SR tablet Commonly known as: SLOW-MAG   potassium chloride SA 20 MEQ tablet Commonly known as: KLOR-CON   potassium phosphate (monobasic) 500 MG tablet Commonly known as: K-PHOS ORIGINAL   Vitamin D3 25 MCG (1000 UT) Caps     TAKE these medications   accu-chek multiclix lancets Use to test blood sugar 3 times daily as instructed. Dx: E11.65   amphetamine-dextroamphetamine 30 MG 24 hr capsule Commonly known as: ADDERALL XR Take 30 mg by mouth daily.   aspirin 81 MG EC tablet Take 1 tablet (81 mg total) by mouth daily. Swallow whole.   b complex vitamins tablet Take 1 tablet by mouth daily.   Bystolic 5 MG tablet Generic drug: nebivolol TAKE 1 TABLET DAILY   clopidogrel 75 MG tablet Commonly known as: PLAVIX Take 1 tablet (75 mg total) by mouth daily.   diphenoxylate-atropine 2.5-0.025 MG tablet Commonly known as: LOMOTIL Take 1 tablet by mouth 4 (four) times daily as needed for diarrhea or loose stools.   Dulaglutide 0.75 MG/0.5ML Sopn Inject 3 mg into the skin once a week.   EPINEPHrine 0.3 mg/0.3 mL Soaj injection Commonly known as: EPI-PEN Inject into the muscle.   escitalopram 20 MG tablet Commonly known as: LEXAPRO Take 20 mg by mouth daily.   glucose blood test strip Commonly known as: Bayer Contour Next Test Use to test blood sugar 3 times daily as instructed. Dx code: E11.65   Januvia 100 MG tablet Generic drug: sitaGLIPtin TAKE 1 TABLET BY MOUTH EVERY DAY.   lisinopril 5 MG tablet Commonly known as: ZESTRIL TAKE 1 TABLET BY MOUTH EVERY DAY   LORazepam 0.5 MG tablet Commonly known as: Ativan Take 1 tablet (0.5 mg total) by mouth daily as needed for anxiety.   Lyrica 200 MG capsule Generic drug: pregabalin    metFORMIN 500 MG 24 hr tablet Commonly known as: GLUCOPHAGE-XR   multivitamin with minerals Tabs tablet Take 1 tablet by mouth daily.   omeprazole 40 MG capsule Commonly known as: PRILOSEC TK 1 C PO D   rosuvastatin 5 MG tablet Commonly known as: CRESTOR Take by mouth.      Verbal and written Discharge instructions given to the patient. Wound care per Discharge AVS  Follow-up Information    Dew, Erskine Squibb, MD Follow up in 1 month(s).   Specialties: Vascular Surgery, Radiology, Interventional Cardiology Why: Can see Dew or Arna Medici.  Will need carotid duplex exam with visit. Contact information: [place] [address] [phone]              Signed: Sela Hua, PA-C  08/08/2020, 11:59 AM

## 2020-08-08 NOTE — Progress Notes (Addendum)
Shift summary:  - AA+Ox4, POD #1. - Ambulating in room without assistance. - Potential for discharge to home today.   - 1100 hrs: This RN discussed discharge orders and plan of care with the patient. PIVs removed. We discussed DC prescriptions, follow-up appointment  (to be made by patient), and signs/symptoms for which to call MD. Awaiting transportation at this time.   - 1120 hrs, Patient discharged from unit. Husband providing transportation home. VS WNL and stable. Patient leaving in good general physical condition.

## 2020-08-08 NOTE — TOC Transition Note (Signed)
Transition of Care [facility]) - CM/SW Discharge Note   Patient Details  Name: [name] MRN: [id number] Date of Birth: [birth year]  [facility] CM/SW Contact:  Meriel Flavors, LCSW Phone Number: 08/08/2020, 4:29 PM   Clinical Narrative:    Patient discharged home today home/self.   Final next level of care: Home/Self Care Barriers to Discharge: No Barriers Identified   Patient Goals and CMS Choice        Discharge Placement                       Discharge Plan and Services                DME Arranged: N/A DME Agency: NA       HH Arranged: NA HH Agency: NA        Social Determinants of Health (SDOH) Interventions     Readmission Risk Interventions No flowsheet data found.

## 2020-08-08 NOTE — Plan of Care (Signed)

## 2020-08-15 ENCOUNTER — Other Ambulatory Visit (INDEPENDENT_AMBULATORY_CARE_PROVIDER_SITE_OTHER): Payer: Self-pay | Admitting: Vascular Surgery

## 2020-08-15 DIAGNOSIS — Z9889 Other specified postprocedural states: Secondary | ICD-10-CM

## 2020-08-15 DIAGNOSIS — Z95828 Presence of other vascular implants and grafts: Secondary | ICD-10-CM

## 2020-08-19 ENCOUNTER — Ambulatory Visit (INDEPENDENT_AMBULATORY_CARE_PROVIDER_SITE_OTHER): Payer: Medicare Other

## 2020-08-19 ENCOUNTER — Encounter (INDEPENDENT_AMBULATORY_CARE_PROVIDER_SITE_OTHER): Payer: Self-pay | Admitting: Vascular Surgery

## 2020-08-19 ENCOUNTER — Ambulatory Visit (INDEPENDENT_AMBULATORY_CARE_PROVIDER_SITE_OTHER): Payer: Medicare Other | Admitting: Vascular Surgery

## 2020-08-19 ENCOUNTER — Other Ambulatory Visit: Payer: Self-pay

## 2020-08-19 VITALS — BP 127/68 | HR 69 | Ht 63.0 in | Wt 159.0 lb

## 2020-08-19 DIAGNOSIS — Z95828 Presence of other vascular implants and grafts: Secondary | ICD-10-CM | POA: Diagnosis not present

## 2020-08-19 DIAGNOSIS — Z9889 Other specified postprocedural states: Secondary | ICD-10-CM | POA: Diagnosis not present

## 2020-08-19 DIAGNOSIS — I10 Essential (primary) hypertension: Secondary | ICD-10-CM

## 2020-08-19 DIAGNOSIS — I6523 Occlusion and stenosis of bilateral carotid arteries: Secondary | ICD-10-CM

## 2020-08-19 NOTE — Assessment & Plan Note (Signed)
blood pressure control important in reducing the progression of atherosclerotic disease. On appropriate oral medications.  

## 2020-08-19 NOTE — Progress Notes (Signed)
Patient ID: [name], female   DOB: [birth year], 74 y.o.   MRN: [id number]  Chief Complaint  Patient presents with  . Follow-up    1 mo [facility] post carotid stenosis     HPI Nikeya Maxim [name] is a 74 y.o. female.  Patient returns two weeks after left carotid stent placement. She is doing well.  No pain. No periprocedural complications.  Duplex today shows her right ICA stenosis to be on the lower end of the 40-59% range and her left ICA stent to be widely patent.    Past Medical History:  Diagnosis Date  . Alcohol abuse   . CAP (community acquired pneumonia) 05/05/2018  . Carotid artery stenosis   . Depression   . Diabetes mellitus   . Diverticulitis   . Fibromyalgia   . Hepatitis 08/28/2014  . Hypertension   . Myalgia and myositis 03/28/2012    Past Surgical History:  Procedure Laterality Date  . BUNIONECTOMY    . CAROTID PTA/STENT INTERVENTION Left 08/07/2020   Procedure: CAROTID PTA/STENT INTERVENTION;  Surgeon: Algernon Huxley, MD;  Location: [facility];  Service: Cardiovascular;  Laterality: Left;  . CESAREAN SECTION    . TONSILLECTOMY        Allergies  Allergen Reactions  . Latex Hives  . Lipitor [Atorvastatin Calcium] Other (See Comments)    Muscle aches  . Codeine Rash    And nausea   . Pentazocine Lactate Rash  . Sulfonamide Derivatives Rash    Current Outpatient Medications  Medication Sig Dispense Refill  . amphetamine-dextroamphetamine (ADDERALL XR) 30 MG 24 hr capsule Take 30 mg by mouth daily.     Marland Kitchen aspirin EC 81 MG EC tablet Take 1 tablet (81 mg total) by mouth daily. Swallow whole. 90 tablet 3  . b complex vitamins tablet Take 1 tablet by mouth daily.    Marland Kitchen BYSTOLIC 5 MG tablet TAKE 1 TABLET DAILY 90 tablet 0  . clopidogrel (PLAVIX) 75 MG tablet Take 1 tablet (75 mg total) by mouth daily. 90 tablet 3  . diphenoxylate-atropine (LOMOTIL) 2.5-0.025 MG tablet Take 1 tablet by mouth 4 (four) times daily as needed for diarrhea or loose stools.      . Dulaglutide 0.75 MG/0.5ML SOPN Inject 3 mg into the skin once a week.     Marland Kitchen EPINEPHrine 0.3 mg/0.3 mL IJ SOAJ injection Inject into the muscle.    . escitalopram (LEXAPRO) 20 MG tablet Take 20 mg by mouth daily.    Marland Kitchen glucose blood (BAYER CONTOUR NEXT TEST) test strip Use to test blood sugar 3 times daily as instructed. Dx code: E11.65 100 each 11  . Lancets (ACCU-CHEK MULTICLIX) lancets Use to test blood sugar 3 times daily as instructed. Dx: E11.65 100 each 11  . lisinopril (PRINIVIL,ZESTRIL) 5 MG tablet TAKE 1 TABLET BY MOUTH EVERY DAY 90 tablet 0  . LORazepam (ATIVAN) 0.5 MG tablet Take 1 tablet (0.5 mg total) by mouth daily as needed for anxiety. 20 tablet 0  . metFORMIN (GLUCOPHAGE-XR) 500 MG 24 hr tablet   0  . omeprazole (PRILOSEC) 40 MG capsule TK 1 C PO D  2  . pregabalin (LYRICA) 200 MG capsule     . pregabalin (LYRICA) 300 MG capsule Take 300 mg by mouth 2 (two) times daily.    . rosuvastatin (CRESTOR) 5 MG tablet Take by mouth.    . sitaGLIPtin (JANUVIA) 100 MG tablet TAKE 1 TABLET BY MOUTH EVERY DAY.    Marland Kitchen  Multiple Vitamin (MULTIVITAMIN WITH MINERALS) TABS tablet Take 1 tablet by mouth daily. 30 tablet 0   No current facility-administered medications for this visit.        Physical Exam BP 127/68   Pulse 69   Ht 5\' 3"  (1.6 m)   Wt 159 lb (72.1 kg)   BMI 28.17 kg/m  Gen:  WD/WN, NAD Skin: incision C/D/I     Assessment/Plan:  Essential hypertension, benign blood pressure control important in reducing the progression of atherosclerotic disease. On appropriate oral medications.   Carotid stenosis, bilateral Duplex today shows her right ICA stenosis to be on the lower end of the 40-59% range and her left ICA stent to be widely patent. She is doing well. Continue plavix until the next visit. RTC three months with duplex.      Leotis Pain 08/19/2020, 12:10 PM   This note was created with Dragon medical transcription system.  Any errors from dictation are  unintentional.

## 2020-08-19 NOTE — Assessment & Plan Note (Signed)
Duplex today shows her right ICA stenosis to be on the lower end of the 40-59% range and her left ICA stent to be widely patent. She is doing well. Continue plavix until the next visit. RTC three months with duplex.

## 2020-08-27 ENCOUNTER — Telehealth: Payer: Medicare Other | Admitting: Nurse Practitioner

## 2020-08-27 DIAGNOSIS — Z20822 Contact with and (suspected) exposure to covid-19: Secondary | ICD-10-CM | POA: Diagnosis not present

## 2020-08-27 NOTE — Progress Notes (Signed)
E-Visit for Corona Virus Screening  Your current symptoms could be consistent with the coronavirus.  Many health care providers can now test patients at their office but not all are.  [facility] has multiple testing sites. For information on our COVID testing locations and hours go to [email or web address]  Even though you have had vaccine you have classic symptoms and need to be tested. We are enrolling you in our [facility] for [place] . Daily you will receive a questionnaire within the [facility] website. Our COVID 19 response team will be monitoring your responses daily.  Testing Information: The COVID-19 Community Testing sites are testing BY APPOINTMENT ONLY.  You can schedule online at [email or web address]  If you do not have access to a smart phone or computer you may call [phone] for an appointment.   Additional testing sites in the Community:  . For [facility] Testing sites in [facility]  [email or web address]  . For Pop-up testing sites in [place]  [email or web address]  . For [facility] [email or web address]  . For [facility] testing in [place] and [employer] [email or web address]  . For [facility] testing in [facility]   [email or web address]  For  more information about community testing call [phone]   Please quarantine yourself while awaiting your test results. Please stay home for a minimum of 10 days from the first day of illness with improving symptoms and you have had 24 hours of no fever (without the use of Tylenol (Acetaminophen) Motrin (Ibuprofen) or any fever reducing medication).  Also - Do not get tested prior to  returning to work because once you have had a positive test the test can stay positive for more than a month in some cases.   You should wear a mask or cloth face covering over your nose and mouth if you must be around other people or animals, including pets (even at home). Try to stay at least 6 feet away from other people. This will protect the people around you.  Please continue good preventive care measures, including:  frequent hand-washing, avoid touching your face, cover coughs/sneezes, stay out of crowds and keep a 6 foot distance from others.  COVID-19 is a respiratory illness with symptoms that are similar to the flu. Symptoms are typically mild to moderate, but there have been cases of severe illness and death due to the virus.   The following symptoms may appear 2-14 days after exposure: . Fever . Cough . Shortness of breath or difficulty breathing . Chills . Repeated shaking with chills . Muscle pain . Headache . Sore throat . New loss of taste or smell . Fatigue . Congestion or runny nose . Nausea or vomiting . Diarrhea  Go to the nearest hospital ED for assessment if fever/cough/breathlessness are severe or illness seems like a threat to life.  It is vitally important that if you feel that you have an infection such as this virus or any other virus that you stay home and away from places where you may spread it to others.  You should avoid contact with people age 7 and older  You may also take acetaminophen (Tylenol) as needed for fever.  Reduce your risk of any infection by using the same precautions used for avoiding the common cold or flu:  Marland Kitchen Wash your hands often with soap and warm water for at least 20 seconds.  If soap and water are not readily available, use an alcohol-based hand sanitizer with at least 60% alcohol.  Marland Kitchen  If coughing or sneezing, cover your mouth and nose by coughing or sneezing into the elbow areas of your shirt or coat, into a tissue or into your sleeve  (not your hands). . Avoid shaking hands with others and consider head nods or verbal greetings only. . Avoid touching your eyes, nose, or mouth with unwashed hands.  . Avoid close contact with people who are sick. . Avoid places or events with large numbers of people in one location, like concerts or sporting events. . Carefully consider travel plans you have or are making. . If you are planning any travel outside or inside the [place], visit the CDC's Travelers' Health webpage for the latest health notices. . If you have some symptoms but not all symptoms, continue to monitor at home and seek medical attention if your symptoms worsen. . If you are having a medical emergency, call 911.  HOME CARE . Only take medications as instructed by your medical team. . Drink plenty of fluids and get plenty of rest. . A steam or ultrasonic humidifier can help if you have congestion.   GET HELP RIGHT AWAY IF YOU HAVE EMERGENCY WARNING SIGNS** FOR COVID-19. If you or someone is showing any of these signs seek emergency medical care immediately. Call 911 or proceed to your closest emergency facility if: . You develop worsening high fever. . Trouble breathing . Bluish lips or face . Persistent pain or pressure in the chest . New confusion . Inability to wake or stay awake . You cough up blood. . Your symptoms become more severe  **This list is not all possible symptoms. Contact your medical provider for any symptoms that are sever or concerning to you.  MAKE SURE YOU   Understand these instructions.  Will watch your condition.  Will get help right away if you are not doing well or get worse.  Your e-visit answers were reviewed by a board certified advanced clinical practitioner to complete your personal care plan.  Depending on the condition, your plan could have included both over the counter or prescription medications.  If there is a problem please reply once you have received a response from your  provider.  Your safety is important to [place].  If you have drug allergies check your prescription carefully.    You can use MyChart to ask questions about today's visit, request a non-urgent call back, or ask for a work or school excuse for 24 hours related to this e-Visit. If it has been greater than 24 hours you will need to follow up with your provider, or enter a new e-Visit to address those concerns. You will get an e-mail in the next two days asking about your experience.  I hope that your e-visit has been valuable and will speed your recovery. Thank you for using e-visits.  5-10 minutes spent reviewing and documenting in chart.

## 2020-09-09 ENCOUNTER — Encounter (INDEPENDENT_AMBULATORY_CARE_PROVIDER_SITE_OTHER): Payer: Medicare Other

## 2020-09-27 ENCOUNTER — Other Ambulatory Visit: Payer: Self-pay

## 2020-09-27 ENCOUNTER — Ambulatory Visit: Payer: Medicare Other | Attending: Internal Medicine

## 2020-09-27 DIAGNOSIS — Z23 Encounter for immunization: Secondary | ICD-10-CM

## 2020-09-27 NOTE — Progress Notes (Signed)
   Covid-19 Vaccination Clinic  Name:  [name]    MRN: [id number] DOB: [birth year]  09/27/2020  [name] was observed post Covid-19 immunization for 30 minutes based on pre-vaccination screening without incident. She was provided with Vaccine Information Sheet and instruction to access the V-Safe system.   [name] was instructed to call 911 with any severe reactions post vaccine: Marland Kitchen Difficulty breathing  . Swelling of face and throat  . A fast heartbeat  . A bad rash all over body  . Dizziness and weakness

## 2020-11-21 ENCOUNTER — Ambulatory Visit (INDEPENDENT_AMBULATORY_CARE_PROVIDER_SITE_OTHER): Payer: Medicare Other

## 2020-11-21 ENCOUNTER — Other Ambulatory Visit: Payer: Self-pay

## 2020-11-21 ENCOUNTER — Ambulatory Visit (INDEPENDENT_AMBULATORY_CARE_PROVIDER_SITE_OTHER): Payer: Medicare Other | Admitting: Vascular Surgery

## 2020-11-21 ENCOUNTER — Encounter (INDEPENDENT_AMBULATORY_CARE_PROVIDER_SITE_OTHER): Payer: Self-pay | Admitting: Vascular Surgery

## 2020-11-21 VITALS — BP 149/77 | HR 85 | Ht 63.0 in | Wt 162.0 lb

## 2020-11-21 DIAGNOSIS — I10 Essential (primary) hypertension: Secondary | ICD-10-CM | POA: Diagnosis not present

## 2020-11-21 DIAGNOSIS — E1139 Type 2 diabetes mellitus with other diabetic ophthalmic complication: Secondary | ICD-10-CM | POA: Diagnosis not present

## 2020-11-21 DIAGNOSIS — I6523 Occlusion and stenosis of bilateral carotid arteries: Secondary | ICD-10-CM

## 2020-11-21 DIAGNOSIS — E785 Hyperlipidemia, unspecified: Secondary | ICD-10-CM | POA: Diagnosis not present

## 2020-11-21 DIAGNOSIS — IMO0002 Reserved for concepts with insufficient information to code with codable children: Secondary | ICD-10-CM

## 2020-11-21 DIAGNOSIS — E1165 Type 2 diabetes mellitus with hyperglycemia: Secondary | ICD-10-CM

## 2020-11-21 NOTE — Assessment & Plan Note (Signed)
Duplex today shows 40 to 59% right ICA stenosis with the highest left ICA stent velocities being 127 cm/s which would fall into the normal range post stent.  Overall doing well.  Would continue aspirin and Plavix for 1 year following stent placement.  Return to clinic in 6 months for follow-up duplex.

## 2020-11-21 NOTE — Progress Notes (Signed)
MRN : [id number]  [name] is a 74 y.o. (12-Jun-1946) female who presents with chief complaint of  Chief Complaint  Patient presents with  . Carotid  .  History of Present Illness: Patient returns in follow-up of her carotid disease.  She is about 3-1/2 to 4 months status post left carotid artery stent placement for high-grade stenosis with previous TIA symptoms.  She is doing well.  No recurrent neurologic symptoms after her procedure.  She is having some blood in her stools and may need a GI work-up.  She will have to come off of Plavix if that is the case but at current that is still pending. Duplex today shows 40 to 59% right ICA stenosis with the highest left ICA stent velocities being 127 cm/s which would fall into the normal range post stent  Current Outpatient Medications  Medication Sig Dispense Refill  . amphetamine-dextroamphetamine (ADDERALL XR) 30 MG 24 hr capsule Take 30 mg by mouth daily.     Marland Kitchen aspirin EC 81 MG EC tablet Take 1 tablet (81 mg total) by mouth daily. Swallow whole. 90 tablet 3  . b complex vitamins tablet Take 1 tablet by mouth daily.    Marland Kitchen BYSTOLIC 5 MG tablet TAKE 1 TABLET DAILY 90 tablet 0  . clopidogrel (PLAVIX) 75 MG tablet Take 1 tablet (75 mg total) by mouth daily. 90 tablet 3  . diphenoxylate-atropine (LOMOTIL) 2.5-0.025 MG tablet Take 1 tablet by mouth 4 (four) times daily as needed for diarrhea or loose stools.     . Dulaglutide 0.75 MG/0.5ML SOPN Inject 3 mg into the skin once a week.     Marland Kitchen EPINEPHrine 0.3 mg/0.3 mL IJ SOAJ injection Inject into the muscle.    . escitalopram (LEXAPRO) 20 MG tablet Take 20 mg by mouth daily.    Marland Kitchen glucose blood (BAYER CONTOUR NEXT TEST) test strip Use to test blood sugar 3 times daily as instructed. Dx code: E11.65 100 each 11  . Lancets (ACCU-CHEK MULTICLIX) lancets Use to test blood sugar 3 times daily as instructed. Dx: E11.65 100 each 11  . lisinopril (PRINIVIL,ZESTRIL) 5 MG tablet TAKE 1 TABLET BY MOUTH EVERY  DAY 90 tablet 0  . LORazepam (ATIVAN) 0.5 MG tablet Take 1 tablet (0.5 mg total) by mouth daily as needed for anxiety. 20 tablet 0  . metFORMIN (GLUCOPHAGE-XR) 500 MG 24 hr tablet   0  . omeprazole (PRILOSEC) 40 MG capsule TK 1 C PO D  2  . pregabalin (LYRICA) 200 MG capsule     . pregabalin (LYRICA) 300 MG capsule Take 300 mg by mouth 2 (two) times daily.    . rosuvastatin (CRESTOR) 5 MG tablet Take by mouth.    . sitaGLIPtin (JANUVIA) 100 MG tablet TAKE 1 TABLET BY MOUTH EVERY DAY.    . Multiple Vitamin (MULTIVITAMIN WITH MINERALS) TABS tablet Take 1 tablet by mouth daily. 30 tablet 0   No current facility-administered medications for this visit.    Past Medical History:  Diagnosis Date  . Alcohol abuse   . CAP (community acquired pneumonia) 05/05/2018  . Carotid artery stenosis   . Depression   . Diabetes mellitus   . Diverticulitis   . Fibromyalgia   . Hepatitis 08/28/2014  . Hypertension   . Myalgia and myositis 03/28/2012    Past Surgical History:  Procedure Laterality Date  . BUNIONECTOMY    . CAROTID PTA/STENT INTERVENTION Left 08/07/2020   Procedure: CAROTID PTA/STENT INTERVENTION;  Surgeon:  Algernon Huxley, MD;  Location: [facility];  Service: Cardiovascular;  Laterality: Left;  . CESAREAN SECTION    . TONSILLECTOMY       Social History   Tobacco Use  . Smoking status: Former Smoker    Quit date: 03/11/2010    Years since quitting: 10.7  . Smokeless tobacco: Never Used  Substance Use Topics  . Alcohol use: Not Currently  . Drug use: No    Comment: Pt denies       Family History  Problem Relation Age of Onset  . Cancer Mother   . Heart disease Father   . Heart disease Sister      Allergies  Allergen Reactions  . Latex Hives  . Lipitor [Atorvastatin Calcium] Other (See Comments)    Muscle aches  . Codeine Rash    And nausea   . Pentazocine Lactate Rash  . Sulfonamide Derivatives Rash     REVIEW OF SYSTEMS(Negative unless  checked)  Constitutional: [] ?????Weight loss[] ?????Fever[] ?????Chills Cardiac:[] ?????Chest pain[x] ?????Chest pressure[] ?????Palpitations [] ?????Shortness of breath when laying flat [] ?????Shortness of breath at rest [x] ?????Shortness of breath with exertion. Vascular: [] ?????Pain in legs with walking[] ?????Pain in legsat rest[] ?????Pain in legs when laying flat [] ?????Claudication [] ?????Pain in feet when walking [] ?????Pain in feet at rest [] ?????Pain in feet when laying flat [] ?????History of DVT [] ?????Phlebitis [] ?????Swelling in legs [] ?????Varicose veins [] ?????Non-healing ulcers Pulmonary: [] ?????Uses home oxygen [] ?????Productive cough[] ?????Hemoptysis [] ?????Wheeze [] ?????COPD [] ?????Asthma Neurologic: [] ?????Dizziness [] ?????Blackouts [] ?????Seizures [] ?????History of stroke [x] ?????History of TIA[] ?????Aphasia [] ?????Temporary blindness[] ?????Dysphagia [] ?????Weaknessor numbness in arms [] ?????Weakness or numbnessin legs Musculoskeletal: [] ?????Arthritis [] ?????Joint swelling [] ?????Joint pain [] ?????Low back pain Hematologic:[] ?????Easy bruising[] ?????Easy bleeding [] ?????Hypercoagulable state [] ?????Anemic [] ?????Hepatitis Gastrointestinal:[] ?????Blood in stool[] ?????Vomiting blood[] ?????Gastroesophageal reflux/heartburn[] ?????Abdominal pain Genitourinary: [] ?????Chronic kidney disease [] ?????Difficulturination [] ?????Frequenturination [] ?????Burning with urination[] ?????Hematuria Skin: [] ?????Rashes [] ?????Ulcers [] ?????Wounds Psychological: [x] ?????History of anxiety[x] ?????History of major depression.   Physical Examination  Vitals:   11/21/20 1126  BP: (!) 149/77  Pulse: 85  Weight: 162 lb (73.5 kg)  Height: 5\' 3"  (1.6 m)   Body mass index is 28.7 kg/m. Gen:  WD/WN, NAD Head: [place]/AT, No temporalis wasting. Ear/Nose/Throat: Hearing grossly intact, nares  w/o erythema or drainage, trachea midline Eyes: Conjunctiva clear. Sclera non-icteric Neck: Supple.  Right carotid bruit  Pulmonary:  Good air movement, equal and clear to auscultation bilaterally.  Cardiac: RRR, No JVD Vascular:  Vessel Right Left  Radial Palpable Palpable   Musculoskeletal: M/S 5/5 throughout.  No deformity or atrophy. No edema. Neurologic: CN 2-12 intact. Sensation grossly intact in extremities.  Symmetrical.  Speech is fluent. Motor exam as listed above. Psychiatric: Judgment intact, Mood & affect appropriate for pt's clinical situation. Dermatologic: No rashes or ulcers noted.  No cellulitis or open wounds.      CBC Lab Results  Component Value Date   WBC 7.1 08/08/2020   HGB 10.8 (L) 08/08/2020   HCT 32.2 (L) 08/08/2020   MCV 89.4 08/08/2020   PLT 244 08/08/2020    BMET    Component Value Date/Time   NA 139 08/08/2020 0803   K 5.0 08/08/2020 0803   CL 112 (H) 08/08/2020 0803   CO2 34 (H) 08/08/2020 0803   GLUCOSE 130 (H) 08/08/2020 0803   GLUCOSE 123 (H) 11/17/2006 1240   BUN 10 08/08/2020 0803   CREATININE 0.60 08/08/2020 0803   CREATININE 0.81 09/16/2014 1759   CALCIUM 9.3 08/08/2020 0803   GFRNONAA >60 08/08/2020 0803   GFRNONAA 76 09/16/2014 1759   GFRAA >60 08/08/2020 0803   GFRAA 88 09/16/2014 1759   CrCl cannot be calculated (Patient's  most recent lab result is older than the maximum 21 days allowed.).  COAG Lab Results  Component Value Date   INR 1.46 01/24/2016   INR 1.05 08/28/2014   INR 0.95 06/08/2014    Radiology No results found.   Assessment/Plan Essential hypertension blood pressure control important in reducing the progression of atherosclerotic disease. On appropriate oral medications.   DM (diabetes mellitus), type 2, uncontrolled w/ophthalmic complication (HCC) blood glucose control important in reducing the progression of atherosclerotic disease. Also, involved in wound healing. On appropriate  medications.  Hyperlipidemia lipid control important in reducing the progression of atherosclerotic disease. Continue statin therapy  Carotid stenosis, bilateral Duplex today shows 40 to 59% right ICA stenosis with the highest left ICA stent velocities being 127 cm/s which would fall into the normal range post stent.  Overall doing well.  Would continue aspirin and Plavix for 1 year following stent placement.  Return to clinic in 6 months for follow-up duplex.    Leotis Pain, MD  11/21/2020 12:04 PM    This note was created with Dragon medical transcription system.  Any errors from dictation are purely unintentional

## 2020-11-30 ENCOUNTER — Other Ambulatory Visit (INDEPENDENT_AMBULATORY_CARE_PROVIDER_SITE_OTHER): Payer: Self-pay | Admitting: Vascular Surgery

## 2021-02-03 ENCOUNTER — Other Ambulatory Visit (INDEPENDENT_AMBULATORY_CARE_PROVIDER_SITE_OTHER): Payer: Self-pay | Admitting: Nurse Practitioner

## 2021-02-16 ENCOUNTER — Other Ambulatory Visit: Payer: Self-pay

## 2021-02-16 ENCOUNTER — Other Ambulatory Visit
Admission: RE | Admit: 2021-02-16 | Discharge: 2021-02-16 | Disposition: A | Discharge status: Home / Self Care | Payer: Medicare Other | Source: Ambulatory Visit | Attending: Internal Medicine | Admitting: Internal Medicine

## 2021-02-16 DIAGNOSIS — Z20822 Contact with and (suspected) exposure to covid-19: Secondary | ICD-10-CM | POA: Insufficient documentation

## 2021-02-16 DIAGNOSIS — Z01812 Encounter for preprocedural laboratory examination: Secondary | ICD-10-CM | POA: Diagnosis present

## 2021-02-16 LAB — SARS CORONAVIRUS 2 (TAT 6-24 HRS): SARS Coronavirus 2: NEGATIVE

## 2021-02-18 ENCOUNTER — Encounter
Admission: RE | Disposition: A | Discharge status: Home / Self Care | Payer: Self-pay | Source: Home / Self Care | Attending: Internal Medicine

## 2021-02-18 ENCOUNTER — Ambulatory Visit: Payer: Medicare Other | Admitting: Anesthesiology

## 2021-02-18 ENCOUNTER — Other Ambulatory Visit: Payer: Self-pay

## 2021-02-18 ENCOUNTER — Encounter: Payer: Self-pay | Admitting: Internal Medicine

## 2021-02-18 ENCOUNTER — Ambulatory Visit
Admission: RE | Admit: 2021-02-18 | Discharge: 2021-02-18 | Disposition: A | Discharge status: Home / Self Care | Payer: Medicare Other | Attending: Internal Medicine | Admitting: Internal Medicine

## 2021-02-18 DIAGNOSIS — Z7984 Long term (current) use of oral hypoglycemic drugs: Secondary | ICD-10-CM | POA: Insufficient documentation

## 2021-02-18 DIAGNOSIS — R933 Abnormal findings on diagnostic imaging of other parts of digestive tract: Secondary | ICD-10-CM | POA: Diagnosis not present

## 2021-02-18 DIAGNOSIS — Z7902 Long term (current) use of antithrombotics/antiplatelets: Secondary | ICD-10-CM | POA: Insufficient documentation

## 2021-02-18 DIAGNOSIS — R195 Other fecal abnormalities: Secondary | ICD-10-CM | POA: Insufficient documentation

## 2021-02-18 DIAGNOSIS — K641 Second degree hemorrhoids: Secondary | ICD-10-CM | POA: Diagnosis not present

## 2021-02-18 DIAGNOSIS — D122 Benign neoplasm of ascending colon: Secondary | ICD-10-CM | POA: Diagnosis not present

## 2021-02-18 DIAGNOSIS — K529 Noninfective gastroenteritis and colitis, unspecified: Secondary | ICD-10-CM | POA: Insufficient documentation

## 2021-02-18 DIAGNOSIS — Z79899 Other long term (current) drug therapy: Secondary | ICD-10-CM | POA: Insufficient documentation

## 2021-02-18 DIAGNOSIS — Z91040 Latex allergy status: Secondary | ICD-10-CM | POA: Insufficient documentation

## 2021-02-18 DIAGNOSIS — Z882 Allergy status to sulfonamides status: Secondary | ICD-10-CM | POA: Diagnosis not present

## 2021-02-18 DIAGNOSIS — Z7982 Long term (current) use of aspirin: Secondary | ICD-10-CM | POA: Insufficient documentation

## 2021-02-18 DIAGNOSIS — K5730 Diverticulosis of large intestine without perforation or abscess without bleeding: Secondary | ICD-10-CM | POA: Insufficient documentation

## 2021-02-18 DIAGNOSIS — Z885 Allergy status to narcotic agent status: Secondary | ICD-10-CM | POA: Insufficient documentation

## 2021-02-18 HISTORY — PX: COLONOSCOPY WITH PROPOFOL: SHX5780

## 2021-02-18 HISTORY — PX: ESOPHAGOGASTRODUODENOSCOPY (EGD) WITH PROPOFOL: SHX5813

## 2021-02-18 LAB — GLUCOSE, CAPILLARY: Glucose-Capillary: 142 mg/dL — ABNORMAL HIGH (ref 70–99)

## 2021-02-18 SURGERY — ESOPHAGOGASTRODUODENOSCOPY (EGD) WITH PROPOFOL
Anesthesia: General

## 2021-02-18 MED ORDER — LIDOCAINE HCL (PF) 2 % IJ SOLN
INTRAMUSCULAR | Status: AC
  Filled 2021-02-18: qty 5

## 2021-02-18 MED ORDER — PROPOFOL 10 MG/ML IV BOLUS
INTRAVENOUS | Status: DC | PRN
  Administered 2021-02-18: 70 mg via INTRAVENOUS
  Administered 2021-02-18: 20 mg via INTRAVENOUS
  Administered 2021-02-18: 10 mg via INTRAVENOUS

## 2021-02-18 MED ORDER — PHENYLEPHRINE HCL (PRESSORS) 10 MG/ML IV SOLN
INTRAVENOUS | Status: DC | PRN
  Administered 2021-02-18: 100 ug via INTRAVENOUS

## 2021-02-18 MED ORDER — MIDAZOLAM HCL 2 MG/2ML IJ SOLN
INTRAMUSCULAR | Status: AC
  Filled 2021-02-18: qty 2

## 2021-02-18 MED ORDER — PROPOFOL 500 MG/50ML IV EMUL
INTRAVENOUS | Status: AC
  Filled 2021-02-18: qty 50

## 2021-02-18 MED ORDER — PROPOFOL 500 MG/50ML IV EMUL
INTRAVENOUS | Status: DC | PRN
  Administered 2021-02-18: 120 ug/kg/min via INTRAVENOUS

## 2021-02-18 MED ORDER — MIDAZOLAM HCL 2 MG/2ML IJ SOLN
INTRAMUSCULAR | Status: DC | PRN
  Administered 2021-02-18: 2 mg via INTRAVENOUS

## 2021-02-18 MED ORDER — EPHEDRINE 5 MG/ML INJ
INTRAVENOUS | Status: AC
  Filled 2021-02-18: qty 10

## 2021-02-18 MED ORDER — PROPOFOL 10 MG/ML IV BOLUS
INTRAVENOUS | Status: AC
  Filled 2021-02-18: qty 20

## 2021-02-18 MED ORDER — PHENYLEPHRINE HCL (PRESSORS) 10 MG/ML IV SOLN
INTRAVENOUS | Status: AC
  Filled 2021-02-18: qty 1

## 2021-02-18 MED ORDER — SODIUM CHLORIDE 0.9 % IV SOLN: INTRAVENOUS | Status: DC

## 2021-02-18 MED ORDER — EPHEDRINE SULFATE 50 MG/ML IJ SOLN
INTRAMUSCULAR | Status: DC | PRN
  Administered 2021-02-18: 10 mg via INTRAVENOUS
  Administered 2021-02-18 (×2): 15 mg via INTRAVENOUS

## 2021-02-18 NOTE — Anesthesia Preprocedure Evaluation (Signed)
Anesthesia Evaluation  Patient identified by MRN, date of birth, ID band Patient awake    Reviewed: Allergy & Precautions, NPO status , Patient's Chart, lab work & pertinent test results  History of Anesthesia Complications Negative for: history of anesthetic complications  Airway Mallampati: II  TM Distance: >3 FB Neck ROM: Full    Dental no notable dental hx.    Pulmonary neg sleep apnea, neg COPD, former smoker,    breath sounds clear to auscultation- rhonchi (-) wheezing      Cardiovascular Exercise Tolerance: Good hypertension, Pt. on medications (-) CAD, (-) Past MI, (-) Cardiac Stents and (-) CABG  Rhythm:Regular Rate:Normal - Systolic murmurs and - Diastolic murmurs    Neuro/Psych neg Seizures PSYCHIATRIC DISORDERS Anxiety Depression    GI/Hepatic negative GI ROS, Neg liver ROS,   Endo/Other  diabetes, Oral Hypoglycemic Agents  Renal/GU negative Renal ROS     Musculoskeletal  (+) Fibromyalgia -  Abdominal (+) - obese,   Peds  Hematology negative hematology ROS (+)   Anesthesia Other Findings Past Medical History: No date: Alcohol abuse 05/05/2018: CAP (community acquired pneumonia) No date: Carotid artery stenosis No date: Depression No date: Diabetes mellitus No date: Diverticulitis No date: Fibromyalgia 08/28/2014: Hepatitis No date: Hypertension 03/28/2012: Myalgia and myositis   Reproductive/Obstetrics                             Anesthesia Physical Anesthesia Plan  ASA: II  Anesthesia Plan: General   Post-op Pain Management:    Induction: Intravenous  PONV Risk Score and Plan: 2 and Propofol infusion  Airway Management Planned: Natural Airway  Additional Equipment:   Intra-op Plan:   Post-operative Plan:   Informed Consent: I have reviewed the patients History and Physical, chart, labs and discussed the procedure including the risks, benefits and  alternatives for the proposed anesthesia with the patient or authorized representative who has indicated his/her understanding and acceptance.     Dental advisory given  Plan Discussed with: CRNA and Anesthesiologist  Anesthesia Plan Comments:         Anesthesia Quick Evaluation

## 2021-02-18 NOTE — Interval H&P Note (Signed)
History and Physical Interval Note:  02/18/2021 9:43 AM  [name]  has presented today for surgery, with the diagnosis of Home positive stool, abnormal CT scan, chronic diarrhea.  The various methods of treatment have been discussed with the patient and family. After consideration of risks, benefits and other options for treatment, the patient has consented to  Procedure(s): ESOPHAGOGASTRODUODENOSCOPY (EGD) WITH PROPOFOL (N/A) COLONOSCOPY WITH PROPOFOL (N/A) as a surgical intervention.  The patient's history has been reviewed, patient examined, no change in status, stable for surgery.  I have reviewed the patient's chart and labs.  Questions were answered to the patient's satisfaction.     [place], [place]

## 2021-02-18 NOTE — Op Note (Signed)
[facility] Patient Name: [name] Procedure Date: 02/18/2021 9:30 AM MRN: [id number] Account #: [id number] Date of Birth: Jan 16, 1946 Admit Type: Outpatient Age: 75 Room: [facility] ENDO ROOM 2 Gender: Female Note Status: Finalized Procedure:             Colonoscopy Indications:           Chronic diarrhea, Heme positive stool, Abnormal CT of                         the GI tract Providers:             Benay Pike. Alice Reichert MD, MD Referring MD:          Leonie Douglas. Doy Hutching, MD (Referring MD) Medicines:             Propofol per Anesthesia Complications:         No immediate complications. Procedure:             Pre-Anesthesia Assessment:                        - The risks and benefits of the procedure and the                         sedation options and risks were discussed with the                         patient. All questions were answered and informed                         consent was obtained.                        - Patient identification and proposed procedure were                         verified prior to the procedure by the nurse. The                         procedure was verified in the procedure room.                        - ASA Grade Assessment: III - A patient with severe                         systemic disease.                        - After reviewing the risks and benefits, the patient                         was deemed in satisfactory condition to undergo the                         procedure.                        After obtaining informed consent, the colonoscope was                         passed under direct vision. Throughout  the procedure,                         the patient's blood pressure, pulse, and oxygen                         saturations were monitored continuously. The                         Colonoscope was introduced through the anus and                         advanced to the the cecum, identified by appendiceal                          orifice and ileocecal valve. The colonoscopy was                         somewhat difficult due to poor endoscopic                         visualization. Successful completion of the procedure                         was aided by lavage and performing the maneuvers                         documented (below) in this report. The patient                         tolerated the procedure well. The quality of the bowel                         preparation was fair except the cecum was poor. The                         cecum and remainder of colon were irrigated with                         copious amounts of water and suctioned providing an                         overall adequate prep of the entire colon for                         evaluation. Findings:      The perianal and digital rectal examinations were normal. Pertinent       negatives include normal sphincter tone and no palpable rectal lesions.      Two sessile polyps were found in the ascending colon. The polyps were 6       to 8 mm in size. These polyps were removed with a hot snare. Resection       and retrieval were complete.      Many small and large-mouthed diverticula were found in the sigmoid colon       and descending colon. There was no evidence of diverticular bleeding.      Non-bleeding internal hemorrhoids were found during retroflexion. The       hemorrhoids were Grade  II (internal hemorrhoids that prolapse but reduce       spontaneously).      The exam was otherwise without abnormality. Impression:            - Two 6 to 8 mm polyps in the ascending colon, removed                         with a hot snare. Resected and retrieved.                        - Mild diverticulosis in the sigmoid colon and in the                         descending colon. There was no evidence of                         diverticular bleeding.                        - Non-bleeding internal hemorrhoids.                        - The  examination was otherwise normal. Recommendation:        - Patient has a contact number available for                         emergencies. The signs and symptoms of potential                         delayed complications were discussed with the patient.                         Return to normal activities tomorrow. Written                         discharge instructions were provided to the patient.                        - Resume previous diet.                        - Continue present medications.                        - Await pathology results.                        - Repeat colonoscopy is recommended for surveillance.                         The colonoscopy date will be determined after                         pathology results from today's exam become available                         for review.                        - Return to GI office in 3 months.                        -  Follow up with Octavia Bruckner, PA-C in [ ]  months.                        - Random biopsies were not performed and overlooked as                         part of this examination.                        - The findings and recommendations were discussed with                         the patient. Procedure Code(s):     --- Professional ---                        [phone], Colonoscopy, flexible; with removal of                         tumor(s), polyp(s), or other lesion(s) by snare                         technique Diagnosis Code(s):     --- Professional ---                        R93.3, Abnormal findings on diagnostic imaging of                         other parts of digestive tract                        K57.30, Diverticulosis of large intestine without                         perforation or abscess without bleeding                        R19.5, Other fecal abnormalities                        K52.9, Noninfective gastroenteritis and colitis,                         unspecified                        K64.1, Second  degree hemorrhoids                        K63.5, Polyp of colon CPT copyright 2019 American Medical Association. All rights reserved. The codes documented in this report are preliminary and upon coder review may  be revised to meet current compliance requirements. Efrain Sella MD, MD 02/18/2021 10:25:02 AM This report has been signed electronically. Number of Addenda: 0 Note Initiated On: 02/18/2021 9:30 AM Scope Withdrawal Time: 0 hours 15 minutes 7 seconds  Total Procedure Duration: 0 hours 19 minutes 24 seconds  Estimated Blood Loss:  Estimated blood loss: none.      [facility]

## 2021-02-18 NOTE — Interval H&P Note (Signed)
History and Physical Interval Note:  02/18/2021 10:28 AM  [name]  has presented today for surgery, with the diagnosis of Home positive stool, abnormal CT scan, chronic diarrhea.  The various methods of treatment have been discussed with the patient and family. After consideration of risks, benefits and other options for treatment, the patient has consented to  Procedure(s): ESOPHAGOGASTRODUODENOSCOPY (EGD) WITH PROPOFOL (N/A) COLONOSCOPY WITH PROPOFOL (N/A) as a surgical intervention.  The patient's history has been reviewed, patient examined, no change in status, stable for surgery.  I have reviewed the patient's chart and labs.  Questions were answered to the patient's satisfaction.     [place], [place]

## 2021-02-18 NOTE — Op Note (Signed)
[facility] Gastroenterology Patient Name: [name] Procedure Date: 02/18/2021 9:32 AM MRN: [id number] Account #: [id number] Date of Birth: 09-22-1946 Admit Type: Outpatient Age: 75 Room: [facility] ENDO ROOM 2 Gender: Female Note Status: Finalized Procedure:             Upper GI endoscopy Indications:           Abnormal CT of the GI tract, Endoscopy to assess                         diarrhea in patient suspected of having disease of the                         small-bowel Providers:             Benay Pike. Alice Reichert MD, MD Referring MD:          Leonie Douglas. Doy Hutching, MD (Referring MD) Medicines:             Propofol per Anesthesia Complications:         No immediate complications. Procedure:             Pre-Anesthesia Assessment:                        - The risks and benefits of the procedure and the                         sedation options and risks were discussed with the                         patient. All questions were answered and informed                         consent was obtained.                        - Patient identification and proposed procedure were                         verified prior to the procedure by the nurse. The                         procedure was verified in the procedure room.                        - ASA Grade Assessment: III - A patient with severe                         systemic disease.                        - After reviewing the risks and benefits, the patient                         was deemed in satisfactory condition to undergo the                         procedure.                        After obtaining  informed consent, the endoscope was                         passed under direct vision. Throughout the procedure,                         the patient's blood pressure, pulse, and oxygen                         saturations were monitored continuously. The Endoscope                         was introduced through the mouth, and  advanced to the                         third part of duodenum. The upper GI endoscopy was                         accomplished without difficulty. The patient tolerated                         the procedure well. Findings:      The esophagus was normal.      The stomach was normal.      The examined duodenum was normal. Biopsies for histology were taken with       a cold forceps for evaluation of celiac disease. Impression:            - Normal esophagus.                        - Normal stomach.                        - Normal examined duodenum. Biopsied. Recommendation:        - Await pathology results.                        - Proceed with colonoscopy Procedure Code(s):     --- Professional ---                        [phone], Esophagogastroduodenoscopy, flexible,                         transoral; with biopsy, single or multiple Diagnosis Code(s):     --- Professional ---                        R93.3, Abnormal findings on diagnostic imaging of                         other parts of digestive tract                        R19.7, Diarrhea, unspecified CPT copyright 2019 American Medical Association. All rights reserved. The codes documented in this report are preliminary and upon coder review may  be revised to meet current compliance requirements. Efrain Sella MD, MD 02/18/2021 9:55:36 AM This report has been signed electronically. Number of Addenda: 0 Note Initiated On: 02/18/2021 9:32 AM Estimated Blood Loss:  Estimated blood loss: none.  [facility]

## 2021-02-18 NOTE — Transfer of Care (Signed)
Immediate Anesthesia Transfer of Care Note  Patient: [name]  Procedure(s) Performed: ESOPHAGOGASTRODUODENOSCOPY (EGD) WITH PROPOFOL (N/A ) COLONOSCOPY WITH PROPOFOL (N/A )  Patient Location: PACU and Endoscopy Unit  Anesthesia Type:General  Level of Consciousness: drowsy and patient cooperative  Airway & Oxygen Therapy: Patient Spontanous Breathing  Post-op Assessment: Report given to RN and Post -op Vital signs reviewed and stable  Post vital signs: Reviewed and stable  Last Vitals:  Vitals Value Taken Time  BP 100/45 02/18/21 1022  Temp 36.2 C 02/18/21 1019  Pulse 79 02/18/21 1025  Resp 21 02/18/21 1025  SpO2 98 % 02/18/21 1025  Vitals shown include unvalidated device data.  Last Pain:  Vitals:   02/18/21 1019  TempSrc: Temporal  PainSc: Asleep         Complications: No complications documented.

## 2021-02-18 NOTE — H&P (Signed)
Outpatient short stay form Pre-procedure 02/18/2021 10:27 AM Brandom Kerwin K. Alice Reichert, M.D.  Primary Physician: Fulton Reek, M.D.  Reason for visit:  Chronic diarrhea, abnormal CT scan of the GI tract in the abdomen  History of present illness:  AS above. Patient denies intractable heartburn, dysphagia, hemetemesis, abdominal pain, nausea or vomiting.     Current Facility-Administered Medications:  .  0.9 %  sodium chloride infusion, , Intravenous, Continuous, [place], Benay Pike, MD, Last Rate: 20 mL/hr at 02/18/21 0930, Restarted at 02/18/21 4536  Medications Prior to Admission  Medication Sig Dispense Refill Last Dose  . amphetamine-dextroamphetamine (ADDERALL XR) 30 MG 24 hr capsule Take 30 mg by mouth daily.    Past Month at Unknown time  . aspirin EC 81 MG EC tablet Take 1 tablet (81 mg total) by mouth daily. Swallow whole. 90 tablet 3 Past Week at Unknown time  . b complex vitamins tablet Take 1 tablet by mouth daily.   Past Week at Unknown time  . BYSTOLIC 5 MG tablet TAKE 1 TABLET DAILY 90 tablet 0 Past Week at Unknown time  . clopidogrel (PLAVIX) 75 MG tablet TAKE 1 TABLET BY MOUTH EVERY DAY 30 tablet 11 02/13/2021 at Unknown time  . diphenoxylate-atropine (LOMOTIL) 2.5-0.025 MG tablet Take 1 tablet by mouth 4 (four) times daily as needed for diarrhea or loose stools.    Past Month at Unknown time  . Dulaglutide 0.75 MG/0.5ML SOPN Inject 3 mg into the skin once a week.    02/17/2021 at Unknown time  . EPINEPHrine 0.3 mg/0.3 mL IJ SOAJ injection Inject into the muscle.   Past Month at Unknown time  . escitalopram (LEXAPRO) 20 MG tablet Take 20 mg by mouth daily.   02/17/2021 at 0800  . glucose blood (BAYER CONTOUR NEXT TEST) test strip Use to test blood sugar 3 times daily as instructed. Dx code: E11.65 100 each 11 02/17/2021 at Unknown time  . Lancets (ACCU-CHEK MULTICLIX) lancets Use to test blood sugar 3 times daily as instructed. Dx: E11.65 100 each 11 02/17/2021 at Unknown time  . lisinopril  (PRINIVIL,ZESTRIL) 5 MG tablet TAKE 1 TABLET BY MOUTH EVERY DAY 90 tablet 0 02/17/2021 at 0800  . LORazepam (ATIVAN) 0.5 MG tablet Take 1 tablet (0.5 mg total) by mouth daily as needed for anxiety. 20 tablet 0 Past Week at Unknown time  . metFORMIN (GLUCOPHAGE-XR) 500 MG 24 hr tablet   0 02/17/2021  . Multiple Vitamin (MULTIVITAMIN WITH MINERALS) TABS tablet Take 1 tablet by mouth daily. 30 tablet 0 Past Week at Unknown time  . omeprazole (PRILOSEC) 40 MG capsule TK 1 C PO D  2 02/17/2021 at 0800  . pregabalin (LYRICA) 200 MG capsule    02/17/2021 at 0800  . pregabalin (LYRICA) 300 MG capsule Take 300 mg by mouth 2 (two) times daily.   02/17/2021 at 0800  . rosuvastatin (CRESTOR) 5 MG tablet Take by mouth.   Past Week at Unknown time  . sitaGLIPtin (JANUVIA) 100 MG tablet TAKE 1 TABLET BY MOUTH EVERY DAY.   02/17/2021 at 0800     Allergies  Allergen Reactions  . Latex Hives  . Lipitor [Atorvastatin Calcium] Other (See Comments)    Muscle aches  . Codeine Rash    And nausea   . Pentazocine Lactate Rash  . Sulfonamide Derivatives Rash     Past Medical History:  Diagnosis Date  . Alcohol abuse   . CAP (community acquired pneumonia) 05/05/2018  . Carotid artery stenosis   .  Depression   . Diabetes mellitus   . Diverticulitis   . Fibromyalgia   . Hepatitis 08/28/2014  . Hypertension   . Myalgia and myositis 03/28/2012    Review of systems:  Otherwise negative.    Physical Exam  Gen: Alert, oriented. Appears stated age.  HEENT: [place]/AT. PERRLA. Lungs: CTA, no wheezes. CV: RR nl S1, S2. Abd: soft, benign, no masses. BS+ Ext: No edema. Pulses 2+    Planned procedures: Proceed with EGD. The patient understands the nature of the planned procedure, indications, risks, alternatives and potential complications including but not limited to bleeding, infection, perforation, damage to internal organs and possible oversedation/side effects from anesthesia. The patient agrees and gives consent to  proceed.  Please refer to procedure notes for findings, recommendations and patient disposition/instructions.     Taishaun Levels K. Alice Reichert, M.D. Gastroenterology 02/18/2021  10:27 AM

## 2021-02-18 NOTE — H&P (Signed)
Outpatient short stay form Pre-procedure 02/18/2021 9:42 AM [name] K. Alice Reichert, M.D.  Primary Physician: Fulton Reek, M.D.  Reason for visit:  Heme positive stool, chronic diarrhea, abnormal CT of the GI tract  History of present illness:  [name] is a pleasant 75 y/o female with chronic watery diarrhea, heme positive stool and abnormal CT scan of the GI tract showing colitis in the right colon and diverticulosis in the sigmoid.    Current Facility-Administered Medications:  .  0.9 %  sodium chloride infusion, , Intravenous, Continuous, [place], Benay Pike, MD, Last Rate: 20 mL/hr at 02/18/21 0930, Restarted at 02/18/21 9628  Medications Prior to Admission  Medication Sig Dispense Refill Last Dose  . amphetamine-dextroamphetamine (ADDERALL XR) 30 MG 24 hr capsule Take 30 mg by mouth daily.    Past Month at Unknown time  . aspirin EC 81 MG EC tablet Take 1 tablet (81 mg total) by mouth daily. Swallow whole. 90 tablet 3 Past Week at Unknown time  . b complex vitamins tablet Take 1 tablet by mouth daily.   Past Week at Unknown time  . BYSTOLIC 5 MG tablet TAKE 1 TABLET DAILY 90 tablet 0 Past Week at Unknown time  . clopidogrel (PLAVIX) 75 MG tablet TAKE 1 TABLET BY MOUTH EVERY DAY 30 tablet 11 02/13/2021 at Unknown time  . diphenoxylate-atropine (LOMOTIL) 2.5-0.025 MG tablet Take 1 tablet by mouth 4 (four) times daily as needed for diarrhea or loose stools.    Past Month at Unknown time  . Dulaglutide 0.75 MG/0.5ML SOPN Inject 3 mg into the skin once a week.    02/17/2021 at Unknown time  . EPINEPHrine 0.3 mg/0.3 mL IJ SOAJ injection Inject into the muscle.   Past Month at Unknown time  . escitalopram (LEXAPRO) 20 MG tablet Take 20 mg by mouth daily.   02/17/2021 at 0800  . glucose blood (BAYER CONTOUR NEXT TEST) test strip Use to test blood sugar 3 times daily as instructed. Dx code: E11.65 100 each 11 02/17/2021 at Unknown time  . Lancets (ACCU-CHEK MULTICLIX) lancets Use to test blood sugar 3  times daily as instructed. Dx: E11.65 100 each 11 02/17/2021 at Unknown time  . lisinopril (PRINIVIL,ZESTRIL) 5 MG tablet TAKE 1 TABLET BY MOUTH EVERY DAY 90 tablet 0 02/17/2021 at 0800  . LORazepam (ATIVAN) 0.5 MG tablet Take 1 tablet (0.5 mg total) by mouth daily as needed for anxiety. 20 tablet 0 Past Week at Unknown time  . metFORMIN (GLUCOPHAGE-XR) 500 MG 24 hr tablet   0 02/17/2021  . Multiple Vitamin (MULTIVITAMIN WITH MINERALS) TABS tablet Take 1 tablet by mouth daily. 30 tablet 0 Past Week at Unknown time  . omeprazole (PRILOSEC) 40 MG capsule TK 1 C PO D  2 02/17/2021 at 0800  . pregabalin (LYRICA) 200 MG capsule    02/17/2021 at 0800  . pregabalin (LYRICA) 300 MG capsule Take 300 mg by mouth 2 (two) times daily.   02/17/2021 at 0800  . rosuvastatin (CRESTOR) 5 MG tablet Take by mouth.   Past Week at Unknown time  . sitaGLIPtin (JANUVIA) 100 MG tablet TAKE 1 TABLET BY MOUTH EVERY DAY.   02/17/2021 at 0800     Allergies  Allergen Reactions  . Latex Hives  . Lipitor [Atorvastatin Calcium] Other (See Comments)    Muscle aches  . Codeine Rash    And nausea   . Pentazocine Lactate Rash  . Sulfonamide Derivatives Rash     Past Medical History:  Diagnosis  Date  . Alcohol abuse   . CAP (community acquired pneumonia) 05/05/2018  . Carotid artery stenosis   . Depression   . Diabetes mellitus   . Diverticulitis   . Fibromyalgia   . Hepatitis 08/28/2014  . Hypertension   . Myalgia and myositis 03/28/2012    Review of systems:  Otherwise negative.    Physical Exam  Gen: Alert, oriented. Appears stated age.  HEENT: [place]/AT. PERRLA. Lungs: CTA, no wheezes. CV: RR nl S1, S2. Abd: soft, benign, no masses. BS+ Ext: No edema. Pulses 2+    Planned procedures: Proceed with colonoscopy. The patient understands the nature of the planned procedure, indications, risks, alternatives and potential complications including but not limited to bleeding, infection, perforation, damage to internal organs  and possible oversedation/side effects from anesthesia. The patient agrees and gives consent to proceed.  Please refer to procedure notes for findings, recommendations and patient disposition/instructions.     [name] K. Alice Reichert, M.D. Gastroenterology 02/18/2021  9:42 AM

## 2021-02-18 NOTE — Anesthesia Postprocedure Evaluation (Signed)
Anesthesia Post Note  Patient: DESIRA ALESSANDRINI  Procedure(s) Performed: ESOPHAGOGASTRODUODENOSCOPY (EGD) WITH PROPOFOL (N/A ) COLONOSCOPY WITH PROPOFOL (N/A )  Patient location during evaluation: Endoscopy Anesthesia Type: General Level of consciousness: awake and alert and oriented Pain management: pain level controlled Vital Signs Assessment: post-procedure vital signs reviewed and stable Respiratory status: spontaneous breathing, nonlabored ventilation and respiratory function stable Cardiovascular status: blood pressure returned to baseline and stable Postop Assessment: no signs of nausea or vomiting Anesthetic complications: no   No complications documented.   Last Vitals:  Vitals:   02/18/21 1039 02/18/21 1049  BP: (!) 119/49 (!) 127/57  Pulse:    Resp:    Temp:    SpO2:      Last Pain:  Vitals:   02/18/21 1049  TempSrc:   PainSc: 0-No pain                 Criss Bartles

## 2021-02-19 LAB — SURGICAL PATHOLOGY

## 2021-02-20 ENCOUNTER — Encounter: Payer: Self-pay | Admitting: Internal Medicine

## 2021-05-15 ENCOUNTER — Ambulatory Visit (INDEPENDENT_AMBULATORY_CARE_PROVIDER_SITE_OTHER): Payer: Medicare Other | Admitting: Vascular Surgery

## 2021-05-15 ENCOUNTER — Other Ambulatory Visit: Payer: Self-pay

## 2021-05-15 ENCOUNTER — Ambulatory Visit (INDEPENDENT_AMBULATORY_CARE_PROVIDER_SITE_OTHER): Payer: Medicare Other

## 2021-05-15 ENCOUNTER — Encounter (INDEPENDENT_AMBULATORY_CARE_PROVIDER_SITE_OTHER): Payer: Self-pay | Admitting: Vascular Surgery

## 2021-05-15 VITALS — BP 129/73 | HR 83 | Resp 16 | Wt 161.2 lb

## 2021-05-15 DIAGNOSIS — I6523 Occlusion and stenosis of bilateral carotid arteries: Secondary | ICD-10-CM

## 2021-05-15 DIAGNOSIS — E1139 Type 2 diabetes mellitus with other diabetic ophthalmic complication: Secondary | ICD-10-CM | POA: Diagnosis not present

## 2021-05-15 DIAGNOSIS — I10 Essential (primary) hypertension: Secondary | ICD-10-CM

## 2021-05-15 DIAGNOSIS — E785 Hyperlipidemia, unspecified: Secondary | ICD-10-CM | POA: Diagnosis not present

## 2021-05-15 DIAGNOSIS — IMO0002 Reserved for concepts with insufficient information to code with codable children: Secondary | ICD-10-CM

## 2021-05-15 DIAGNOSIS — E1165 Type 2 diabetes mellitus with hyperglycemia: Secondary | ICD-10-CM

## 2021-05-15 NOTE — Assessment & Plan Note (Signed)
Duplex today shows a patent left ICA stent. The right carotid velocities have increased into the 60-79% range.  Although this is below the threshold to need repair at this time, it has progressed and we will need to follow this closely.  I will plan to see her back in 6 months with carotid duplex.  She will continue her dual antiplatelet therapy and statin agent.

## 2021-05-15 NOTE — Progress Notes (Signed)
MRN : [id number]  [name] is a 75 y.o. (Jun 11, 1946) female who presents with chief complaint of  Chief Complaint  Patient presents with  . Carotid    Ultrasound follow up  .  History of Present Illness: Patient returns in follow-up of her carotid disease.  She is coming up on a year status post left carotid artery stent placement last summer for high-grade disease.  She is doing well.  She is currently tolerating aspirin and Plavix without any major issues.  She had some GI bleeding issues last year but that seems to be better.  No focal neurologic symptoms. Specifically, the patient denies amaurosis fugax, speech or swallowing difficulties, or arm or leg weakness or numbness. Duplex today shows a patent left ICA stent. The right carotid velocities have increased into the 60-79% range.   Current Outpatient Medications  Medication Sig Dispense Refill  . amphetamine-dextroamphetamine (ADDERALL XR) 30 MG 24 hr capsule Take 30 mg by mouth daily.     Marland Kitchen aspirin EC 81 MG EC tablet Take 1 tablet (81 mg total) by mouth daily. Swallow whole. 90 tablet 3  . b complex vitamins tablet Take 1 tablet by mouth daily.    Marland Kitchen BYSTOLIC 5 MG tablet TAKE 1 TABLET DAILY 90 tablet 0  . clopidogrel (PLAVIX) 75 MG tablet TAKE 1 TABLET BY MOUTH EVERY DAY 30 tablet 11  . diphenoxylate-atropine (LOMOTIL) 2.5-0.025 MG tablet Take 1 tablet by mouth 4 (four) times daily as needed for diarrhea or loose stools.     . Dulaglutide 0.75 MG/0.5ML SOPN Inject 3 mg into the skin once a week.     Marland Kitchen EPINEPHrine 0.3 mg/0.3 mL IJ SOAJ injection Inject into the muscle.    . escitalopram (LEXAPRO) 20 MG tablet Take 20 mg by mouth daily.    Marland Kitchen glucose blood (BAYER CONTOUR NEXT TEST) test strip Use to test blood sugar 3 times daily as instructed. Dx code: E11.65 100 each 11  . Lancets (ACCU-CHEK MULTICLIX) lancets Use to test blood sugar 3 times daily as instructed. Dx: E11.65 100 each 11  . lisinopril (PRINIVIL,ZESTRIL) 5 MG  tablet TAKE 1 TABLET BY MOUTH EVERY DAY 90 tablet 0  . LORazepam (ATIVAN) 0.5 MG tablet Take 1 tablet (0.5 mg total) by mouth daily as needed for anxiety. 20 tablet 0  . metFORMIN (GLUCOPHAGE-XR) 500 MG 24 hr tablet   0  . omeprazole (PRILOSEC) 40 MG capsule TK 1 C PO D  2  . pregabalin (LYRICA) 200 MG capsule     . pregabalin (LYRICA) 300 MG capsule Take 300 mg by mouth 2 (two) times daily.    . rosuvastatin (CRESTOR) 5 MG tablet Take by mouth.    . sitaGLIPtin (JANUVIA) 100 MG tablet TAKE 1 TABLET BY MOUTH EVERY DAY.    . Multiple Vitamin (MULTIVITAMIN WITH MINERALS) TABS tablet Take 1 tablet by mouth daily. 30 tablet 0   No current facility-administered medications for this visit.    Past Medical History:  Diagnosis Date  . Alcohol abuse   . CAP (community acquired pneumonia) 05/05/2018  . Carotid artery stenosis   . Depression   . Diabetes mellitus   . Diverticulitis   . Fibromyalgia   . Hepatitis 08/28/2014  . Hypertension   . Myalgia and myositis 03/28/2012    Past Surgical History:  Procedure Laterality Date  . BUNIONECTOMY    . CAROTID PTA/STENT INTERVENTION Left 08/07/2020   Procedure: CAROTID PTA/STENT INTERVENTION;  Surgeon: Leotis Pain  S, MD;  Location: [facility];  Service: Cardiovascular;  Laterality: Left;  . CESAREAN SECTION    . COLONOSCOPY    . COLONOSCOPY WITH ESOPHAGOGASTRODUODENOSCOPY (EGD)    . COLONOSCOPY WITH PROPOFOL N/A 02/18/2021   Procedure: COLONOSCOPY WITH PROPOFOL;  Surgeon: [place], Benay Pike, MD;  Location: [facility];  Service: Gastroenterology;  Laterality: N/A;  . ESOPHAGOGASTRODUODENOSCOPY (EGD) WITH PROPOFOL N/A 02/18/2021   Procedure: ESOPHAGOGASTRODUODENOSCOPY (EGD) WITH PROPOFOL;  Surgeon: [place], Benay Pike, MD;  Location: [facility];  Service: Gastroenterology;  Laterality: N/A;  . TONSILLECTOMY       Social History   Tobacco Use  . Smoking status: Former Smoker    Quit date: 03/11/2010    Years since quitting: 11.1  .  Smokeless tobacco: Never Used  Vaping Use  . Vaping Use: Former  Substance Use Topics  . Alcohol use: Not Currently  . Drug use: No    Comment: Pt denies        Family History  Problem Relation Age of Onset  . Cancer Mother   . Heart disease Father   . Heart disease Sister      Allergies  Allergen Reactions  . Latex Hives  . Lipitor [Atorvastatin Calcium] Other (See Comments)    Muscle aches  . Codeine Rash    And nausea   . Pentazocine Lactate Rash  . Sulfonamide Derivatives Rash    REVIEW OF SYSTEMS(Negative unless checked)  Constitutional: [] ??????Weight loss[] ??????Fever[] ??????Chills Cardiac:[] ??????Chest pain[x] ??????Chest pressure[] ??????Palpitations [] ??????Shortness of breath when laying flat [] ??????Shortness of breath at rest [x] ??????Shortness of breath with exertion. Vascular: [] ??????Pain in legs with walking[] ??????Pain in legsat rest[] ??????Pain in legs when laying flat [] ??????Claudication [] ??????Pain in feet when walking [] ??????Pain in feet at rest [] ??????Pain in feet when laying flat [] ??????History of DVT [] ??????Phlebitis [] ??????Swelling in legs [] ??????Varicose veins [] ??????Non-healing ulcers Pulmonary: [] ??????Uses home oxygen [] ??????Productive cough[] ??????Hemoptysis [] ??????Wheeze [] ??????COPD [] ??????Asthma Neurologic: [] ??????Dizziness [] ??????Blackouts [] ??????Seizures [] ??????History of stroke [x] ??????History of TIA[] ??????Aphasia [] ??????Temporary blindness[] ??????Dysphagia [] ??????Weaknessor numbness in arms [] ??????Weakness or numbnessin legs Musculoskeletal: [] ??????Arthritis [] ??????Joint swelling [] ??????Joint pain [] ??????Low back pain Hematologic:[] ??????Easy bruising[] ??????Easy bleeding [] ??????Hypercoagulable state [] ??????Anemic [] ??????Hepatitis Gastrointestinal:[] ??????Blood in stool[] ??????Vomiting blood[] ??????Gastroesophageal  reflux/heartburn[] ??????Abdominal pain Genitourinary: [] ??????Chronic kidney disease [] ??????Difficulturination [] ??????Frequenturination [] ??????Burning with urination[] ??????Hematuria Skin: [] ??????Rashes [] ??????Ulcers [] ??????Wounds Psychological: [x] ??????History of anxiety[x] ??????History of major depression.   Physical Examination  Vitals:   05/15/21 1122  BP: 129/73  Pulse: 83  Resp: 16  Weight: 161 lb 3.2 oz (73.1 kg)   Body mass index is 28.56 kg/m. Gen:  WD/WN, NAD Head: [place]/AT, No temporalis wasting. Ear/Nose/Throat: Hearing grossly intact, nares w/o erythema or drainage, trachea midline Eyes: Conjunctiva clear. Sclera non-icteric Neck: Supple.  Right carotid bruit  Pulmonary:  Good air movement, equal and clear to auscultation bilaterally.  Cardiac: RRR, No JVD Vascular:  Vessel Right Left  Radial Palpable Palpable           Musculoskeletal: M/S 5/5 throughout.  No deformity or atrophy. No edema. Neurologic: CN 2-12 intact. Sensation grossly intact in extremities.  Symmetrical.  Speech is fluent. Motor exam as listed above. Psychiatric: Judgment intact, Mood & affect appropriate for pt's clinical situation. Dermatologic: No rashes or ulcers noted.  No cellulitis or open wounds.   CBC Lab Results  Component Value Date   WBC 7.1 08/08/2020   HGB 10.8 (L) 08/08/2020   HCT 32.2 (L) 08/08/2020   MCV 89.4 08/08/2020   PLT 244 08/08/2020    BMET    Component Value Date/Time   NA 139 08/08/2020 0803   K 5.0 08/08/2020 0803  CL 112 (H) 08/08/2020 0803   CO2 34 (H) 08/08/2020 0803   GLUCOSE 130 (H) 08/08/2020 0803   GLUCOSE 123 (H) 11/17/2006 1240   BUN 10 08/08/2020 0803   CREATININE 0.60 08/08/2020 0803   CREATININE 0.81 09/16/2014 1759   CALCIUM 9.3 08/08/2020 0803   GFRNONAA >60 08/08/2020 0803   GFRNONAA 76 09/16/2014 1759   GFRAA >60 08/08/2020 0803   GFRAA 88 09/16/2014 1759   CrCl cannot be calculated (Patient's most  recent lab result is older than the maximum 21 days allowed.).  COAG Lab Results  Component Value Date   INR 1.46 01/24/2016   INR 1.05 08/28/2014   INR 0.95 06/08/2014    Radiology No results found.   Assessment/Plan Essential hypertension blood pressure control important in reducing the progression of atherosclerotic disease. On appropriate oral medications.   DM (diabetes mellitus), type 2, uncontrolled w/ophthalmic complication (HCC) blood glucose control important in reducing the progression of atherosclerotic disease. Also, involved in wound healing. On appropriate medications.  Hyperlipidemia lipid control important in reducing the progression of atherosclerotic disease. Continue statin therapy  Carotid stenosis, bilateral Duplex today shows a patent left ICA stent. The right carotid velocities have increased into the 60-79% range.  Although this is below the threshold to need repair at this time, it has progressed and we will need to follow this closely.  I will plan to see her back in 6 months with carotid duplex.  She will continue her dual antiplatelet therapy and statin agent.    Leotis Pain, MD  05/15/2021 11:58 AM    This note was created with Dragon medical transcription system.  Any errors from dictation are purely unintentional

## 2021-10-22 ENCOUNTER — Ambulatory Visit (INDEPENDENT_AMBULATORY_CARE_PROVIDER_SITE_OTHER): Payer: Medicare Other

## 2021-10-22 ENCOUNTER — Ambulatory Visit (INDEPENDENT_AMBULATORY_CARE_PROVIDER_SITE_OTHER): Payer: Medicare Other | Admitting: Podiatry

## 2021-10-22 ENCOUNTER — Other Ambulatory Visit: Payer: Self-pay

## 2021-10-22 ENCOUNTER — Encounter: Payer: Self-pay | Admitting: Podiatry

## 2021-10-22 DIAGNOSIS — M2011 Hallux valgus (acquired), right foot: Secondary | ICD-10-CM | POA: Diagnosis not present

## 2021-10-22 DIAGNOSIS — I6523 Occlusion and stenosis of bilateral carotid arteries: Secondary | ICD-10-CM | POA: Diagnosis not present

## 2021-10-22 NOTE — Progress Notes (Signed)
Subjective:  Patient ID: [name], female    DOB: 14-Mar-1946,  MRN: [id number] HPI Chief Complaint  Patient presents with   Foot Pain    1st MPJ right - bunion deformity x years, aching more and more recently, redness, shoes are uncomfortable, had left foot fixed with cast and it was an "awful experience"   New Patient (Initial Visit)   Diabetes    Last a1c was 8.1    75 y.o. female presents with the above complaint.   ROS: Denies fever chills nausea vomiting muscle aches pains calf pain back pain chest pain shortness of breath.  Past Medical History:  Diagnosis Date   Alcohol abuse    CAP (community acquired pneumonia) 05/05/2018   Carotid artery stenosis    Depression    Diabetes mellitus    Diverticulitis    Fibromyalgia    Hepatitis 08/28/2014   Hypertension    Myalgia and myositis 03/28/2012   Past Surgical History:  Procedure Laterality Date   BUNIONECTOMY     CAROTID PTA/STENT INTERVENTION Left 08/07/2020   Procedure: CAROTID PTA/STENT INTERVENTION;  Surgeon: Algernon Huxley, MD;  Location: [place] [facility];  Service: Cardiovascular;  Laterality: Left;   CESAREAN SECTION     COLONOSCOPY     COLONOSCOPY WITH ESOPHAGOGASTRODUODENOSCOPY (EGD)     COLONOSCOPY WITH PROPOFOL N/A 02/18/2021   Procedure: COLONOSCOPY WITH PROPOFOL;  Surgeon: Toledo, Benay Pike, MD;  Location: [facility];  Service: Gastroenterology;  Laterality: N/A;   ESOPHAGOGASTRODUODENOSCOPY (EGD) WITH PROPOFOL N/A 02/18/2021   Procedure: ESOPHAGOGASTRODUODENOSCOPY (EGD) WITH PROPOFOL;  Surgeon: Toledo, Benay Pike, MD;  Location: [facility];  Service: Gastroenterology;  Laterality: N/A;   TONSILLECTOMY      Current Outpatient Medications:    amphetamine-dextroamphetamine (ADDERALL XR) 30 MG 24 hr capsule, Take 30 mg by mouth daily. , Disp: , Rfl:    aspirin EC 81 MG EC tablet, Take 1 tablet (81 mg total) by mouth daily. Swallow whole., Disp: 90 tablet, Rfl: 3   b complex vitamins tablet, Take 1  tablet by mouth daily., Disp: , Rfl:    BYSTOLIC 5 MG tablet, TAKE 1 TABLET DAILY, Disp: 90 tablet, Rfl: 0   clopidogrel (PLAVIX) 75 MG tablet, TAKE 1 TABLET BY MOUTH EVERY DAY, Disp: 30 tablet, Rfl: 11   diphenoxylate-atropine (LOMOTIL) 2.5-0.025 MG tablet, Take 1 tablet by mouth 4 (four) times daily as needed for diarrhea or loose stools. , Disp: , Rfl:    Dulaglutide 0.75 MG/0.5ML SOPN, Inject 3 mg into the skin once a week. , Disp: , Rfl:    EPINEPHrine 0.3 mg/0.3 mL IJ SOAJ injection, Inject into the muscle., Disp: , Rfl:    escitalopram (LEXAPRO) 20 MG tablet, Take 20 mg by mouth daily., Disp: , Rfl:    fluorouracil (EFUDEX) 5 % cream, SMARTSIG:Sparingly Topical Twice Daily, Disp: , Rfl:    glucose blood (BAYER CONTOUR NEXT TEST) test strip, Use to test blood sugar 3 times daily as instructed. Dx code: E11.65, Disp: 100 each, Rfl: 11   Lancets (ACCU-CHEK MULTICLIX) lancets, Use to test blood sugar 3 times daily as instructed. Dx: E11.65, Disp: 100 each, Rfl: 11   lisinopril (PRINIVIL,ZESTRIL) 5 MG tablet, TAKE 1 TABLET BY MOUTH EVERY DAY, Disp: 90 tablet, Rfl: 0   LORazepam (ATIVAN) 0.5 MG tablet, Take 1 tablet (0.5 mg total) by mouth daily as needed for anxiety., Disp: 20 tablet, Rfl: 0   metFORMIN (GLUCOPHAGE-XR) 500 MG 24 hr tablet, , Disp: , Rfl: 0  omeprazole (PRILOSEC) 40 MG capsule, TK 1 C PO D, Disp: , Rfl: 2   pregabalin (LYRICA) 200 MG capsule, , Disp: , Rfl:    pregabalin (LYRICA) 300 MG capsule, Take 300 mg by mouth 2 (two) times daily., Disp: , Rfl:    rosuvastatin (CRESTOR) 5 MG tablet, Take by mouth., Disp: , Rfl:    sitaGLIPtin (JANUVIA) 100 MG tablet, TAKE 1 TABLET BY MOUTH EVERY DAY., Disp: , Rfl:   Allergies  Allergen Reactions   Latex Hives   Lipitor [Atorvastatin Calcium] Other (See Comments)    Muscle aches   Codeine Rash    And nausea    Pentazocine Lactate Rash   Sulfonamide Derivatives Rash   Review of Systems Objective:  There were no vitals filed for  this visit.  General: Well developed, nourished, in no acute distress, alert and oriented x3   Dermatological: Skin is warm, dry and supple bilateral. Nails x 10 are well maintained; remaining integument appears unremarkable at this time. There are no open sores, no preulcerative lesions, no rash or signs of infection present.  Vascular: Dorsalis Pedis artery and Posterior Tibial artery pedal pulses are 2/4 bilateral with immedate capillary fill time. Pedal hair growth present. No varicosities and no lower extremity edema present bilateral.   Neruologic: Grossly intact via light touch bilateral. Vibratory intact via tuning fork bilateral. Protective threshold with Semmes Wienstein monofilament intact to all pedal sites bilateral. Patellar and Achilles deep tendon reflexes 2+ bilateral. No Babinski or clonus noted bilateral.   Musculoskeletal: No gross boney pedal deformities bilateral. No pain, crepitus, or limitation noted with foot and ankle range of motion bilateral. Muscular strength 5/5 in all groups tested bilateral.  Hypermobile first TMT joint with an increase in the first intermetatarsal angle moderately reducible.  Hallux abductus angle is increased and the toe appears to be dislocated with limited dorsiflexion.  She has a hammertoe deformity of the second right with the toe not being able to be put in the neutral position either at the PIPJ or the metatarsal phalangeal joint.  Flexible hammertoe deformities #3 #4 #5  Gait: Unassisted, Nonantalgic.    Radiographs:  Radiographs taken today demonstrate osseously mature individual with mild osteopenia.  An increase in the first intermetatarsal angle greater than 15 degrees.  Hallux abductus angle dislocated and greater than normal dimensions.  Lateral view does demonstrate hammertoe deformities second worse than third fourth or fifth.  He also demonstrates an elongated plantarflexed second metatarsal.  Assessment & Plan:   Assessment:  Recent right knee injury.  Severe hallux abductovalgus deformity plantarflexed second metatarsal.  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  Plan: Discussed etiology pathology conservative versus surgical therapies.  I expressed to her that it be necessary for her to get her diabetes under better control before surgery would be an option.  Also instructed her to get her knee taken care of so this that can be rehabbed prior to any type of surgery on her foot itself.  She understands and is amenable to it and she and I will follow-up with each other once her knee is well taken care of.     Shahida Schnackenberg T. [place], [place]

## 2021-11-20 ENCOUNTER — Ambulatory Visit (INDEPENDENT_AMBULATORY_CARE_PROVIDER_SITE_OTHER): Payer: Medicare Other | Admitting: Vascular Surgery

## 2021-11-20 ENCOUNTER — Encounter (INDEPENDENT_AMBULATORY_CARE_PROVIDER_SITE_OTHER): Payer: Medicare Other

## 2021-11-29 ENCOUNTER — Other Ambulatory Visit (INDEPENDENT_AMBULATORY_CARE_PROVIDER_SITE_OTHER): Payer: Self-pay | Admitting: Vascular Surgery

## 2021-11-30 ENCOUNTER — Other Ambulatory Visit (INDEPENDENT_AMBULATORY_CARE_PROVIDER_SITE_OTHER): Payer: Self-pay | Admitting: Vascular Surgery

## 2021-12-18 ENCOUNTER — Ambulatory Visit (INDEPENDENT_AMBULATORY_CARE_PROVIDER_SITE_OTHER): Payer: HMO

## 2021-12-18 ENCOUNTER — Ambulatory Visit (INDEPENDENT_AMBULATORY_CARE_PROVIDER_SITE_OTHER): Payer: HMO | Admitting: Vascular Surgery

## 2021-12-18 ENCOUNTER — Other Ambulatory Visit: Payer: Self-pay

## 2021-12-18 ENCOUNTER — Encounter (INDEPENDENT_AMBULATORY_CARE_PROVIDER_SITE_OTHER): Payer: Self-pay | Admitting: Vascular Surgery

## 2021-12-18 VITALS — BP 119/71 | HR 74 | Resp 16 | Wt 162.2 lb

## 2021-12-18 DIAGNOSIS — I10 Essential (primary) hypertension: Secondary | ICD-10-CM | POA: Diagnosis not present

## 2021-12-18 DIAGNOSIS — E1139 Type 2 diabetes mellitus with other diabetic ophthalmic complication: Secondary | ICD-10-CM | POA: Diagnosis not present

## 2021-12-18 DIAGNOSIS — I6523 Occlusion and stenosis of bilateral carotid arteries: Secondary | ICD-10-CM

## 2021-12-18 DIAGNOSIS — E785 Hyperlipidemia, unspecified: Secondary | ICD-10-CM | POA: Diagnosis not present

## 2021-12-18 NOTE — Progress Notes (Signed)
MRN : [id number]  [name] is a 76 y.o. (19-Aug-1946) female who presents with chief complaint of  Chief Complaint  Patient presents with   Follow-up    Ultrasound follow up  .  History of Present Illness: Patient returns in follow-up of her carotid disease.  She is about a year and a half status post left carotid stent for high-grade symptomatic stenosis.  Doing well with no new complaints since her last visit.  Tolerating medications without any major issues. Carotid duplex today shows improvement in the right carotid velocities down to the 40-59% range and a widely patent left carotid stent  Current Outpatient Medications  Medication Sig Dispense Refill   amphetamine-dextroamphetamine (ADDERALL XR) 30 MG 24 hr capsule Take 30 mg by mouth daily.      aspirin EC 81 MG EC tablet Take 1 tablet (81 mg total) by mouth daily. Swallow whole. 90 tablet 3   b complex vitamins tablet Take 1 tablet by mouth daily.     BYSTOLIC 5 MG tablet TAKE 1 TABLET DAILY 90 tablet 0   clopidogrel (PLAVIX) 75 MG tablet TAKE 1 TABLET BY MOUTH EVERY DAY 30 tablet 11   diphenoxylate-atropine (LOMOTIL) 2.5-0.025 MG tablet Take 1 tablet by mouth 4 (four) times daily as needed for diarrhea or loose stools.      Dulaglutide 0.75 MG/0.5ML SOPN Inject 3 mg into the skin once a week.      EPINEPHrine 0.3 mg/0.3 mL IJ SOAJ injection Inject into the muscle.     escitalopram (LEXAPRO) 20 MG tablet Take 20 mg by mouth daily.     fluorouracil (EFUDEX) 5 % cream SMARTSIG:Sparingly Topical Twice Daily     glucose blood (BAYER CONTOUR NEXT TEST) test strip Use to test blood sugar 3 times daily as instructed. Dx code: E11.65 100 each 11   Lancets (ACCU-CHEK MULTICLIX) lancets Use to test blood sugar 3 times daily as instructed. Dx: E11.65 100 each 11   lisinopril (PRINIVIL,ZESTRIL) 5 MG tablet TAKE 1 TABLET BY MOUTH EVERY DAY 90 tablet 0   LORazepam (ATIVAN) 0.5 MG tablet Take 1 tablet (0.5 mg total) by mouth daily as  needed for anxiety. 20 tablet 0   metFORMIN (GLUCOPHAGE-XR) 500 MG 24 hr tablet   0   omeprazole (PRILOSEC) 40 MG capsule TK 1 C PO D  2   pregabalin (LYRICA) 200 MG capsule      pregabalin (LYRICA) 300 MG capsule Take 300 mg by mouth 2 (two) times daily.     rosuvastatin (CRESTOR) 5 MG tablet Take by mouth.     sitaGLIPtin (JANUVIA) 100 MG tablet TAKE 1 TABLET BY MOUTH EVERY DAY.     No current facility-administered medications for this visit.    Past Medical History:  Diagnosis Date   Alcohol abuse    CAP (community acquired pneumonia) 05/05/2018   Carotid artery stenosis    Depression    Diabetes mellitus    Diverticulitis    Fibromyalgia    Hepatitis 08/28/2014   Hypertension    Myalgia and myositis 03/28/2012    Past Surgical History:  Procedure Laterality Date   BUNIONECTOMY     CAROTID PTA/STENT INTERVENTION Left 08/07/2020   Procedure: CAROTID PTA/STENT INTERVENTION;  Surgeon: Algernon Huxley, MD;  Location: [place] [facility];  Service: Cardiovascular;  Laterality: Left;   CESAREAN SECTION     COLONOSCOPY     COLONOSCOPY WITH ESOPHAGOGASTRODUODENOSCOPY (EGD)     COLONOSCOPY WITH PROPOFOL N/A 02/18/2021  Procedure: COLONOSCOPY WITH PROPOFOL;  Surgeon: Toledo, Benay Pike, MD;  Location: [facility];  Service: Gastroenterology;  Laterality: N/A;   ESOPHAGOGASTRODUODENOSCOPY (EGD) WITH PROPOFOL N/A 02/18/2021   Procedure: ESOPHAGOGASTRODUODENOSCOPY (EGD) WITH PROPOFOL;  Surgeon: Toledo, Benay Pike, MD;  Location: [facility];  Service: Gastroenterology;  Laterality: N/A;   TONSILLECTOMY       Social History   Tobacco Use   Smoking status: Former    Types: Cigarettes    Quit date: 03/11/2010    Years since quitting: 11.7   Smokeless tobacco: Never  Vaping Use   Vaping Use: Former  Substance Use Topics   Alcohol use: Not Currently   Drug use: No    Comment: Pt denies       Family History  Problem Relation Age of Onset   Cancer Mother    Heart disease Father     Heart disease Sister      Allergies  Allergen Reactions   Latex Hives   Lipitor [Atorvastatin Calcium] Other (See Comments)    Muscle aches   Codeine Rash    And nausea    Pentazocine Lactate Rash   Sulfonamide Derivatives Rash    REVIEW OF SYSTEMS (Negative unless checked)   Constitutional: [] Weight loss  [] Fever  [] Chills Cardiac: [] Chest pain   [x] Chest pressure   [] Palpitations   [] Shortness of breath when laying flat   [] Shortness of breath at rest   [x] Shortness of breath with exertion. Vascular:  [] Pain in legs with walking   [] Pain in legs at rest   [] Pain in legs when laying flat   [] Claudication   [] Pain in feet when walking  [] Pain in feet at rest  [] Pain in feet when laying flat   [] History of DVT   [] Phlebitis   [] Swelling in legs   [] Varicose veins   [] Non-healing ulcers Pulmonary:   [] Uses home oxygen   [] Productive cough   [] Hemoptysis   [] Wheeze  [] COPD   [] Asthma Neurologic:  [] Dizziness  [] Blackouts   [] Seizures   [] History of stroke   [x] History of TIA  [] Aphasia   [] Temporary blindness   [] Dysphagia   [] Weakness or numbness in arms   [] Weakness or numbness in legs Musculoskeletal:  [] Arthritis   [] Joint swelling   [] Joint pain   [] Low back pain Hematologic:  [] Easy bruising  [] Easy bleeding   [] Hypercoagulable state   [] Anemic  [] Hepatitis Gastrointestinal:  [] Blood in stool   [] Vomiting blood  [] Gastroesophageal reflux/heartburn   [] Abdominal pain Genitourinary:  [] Chronic kidney disease   [] Difficult urination  [] Frequent urination  [] Burning with urination   [] Hematuria Skin:  [] Rashes   [] Ulcers   [] Wounds Psychological:  [x] History of anxiety   [x]  History of major depression.  Physical Examination  Vitals:   12/18/21 0910  BP: 119/71  Pulse: 74  Resp: 16  Weight: 162 lb 3.2 oz (73.6 kg)   Body mass index is 28.73 kg/m. Gen:  WD/WN, NAD Head: [place]/AT, No temporalis wasting. Ear/Nose/Throat: Hearing grossly intact, nares w/o erythema or drainage,  trachea midline Eyes: Conjunctiva clear. Sclera non-icteric Neck: Supple.  No bruit  Pulmonary:  Good air movement, equal and clear to auscultation bilaterally.  Cardiac: RRR, No JVD Vascular:  Vessel Right Left  Radial Palpable Palpable       Musculoskeletal: M/S 5/5 throughout.  No deformity or atrophy. No edema. Neurologic: CN 2-12 intact. Sensation grossly intact in extremities.  Symmetrical.  Speech is fluent. Motor exam as listed above. Psychiatric: Judgment intact, Mood & affect  appropriate for pt's clinical situation. Dermatologic: No rashes or ulcers noted.  No cellulitis or open wounds. Lymph : No Cervical, Axillary, or Inguinal lymphadenopathy.    CBC Lab Results  Component Value Date   WBC 7.1 08/08/2020   HGB 10.8 (L) 08/08/2020   HCT 32.2 (L) 08/08/2020   MCV 89.4 08/08/2020   PLT 244 08/08/2020    BMET    Component Value Date/Time   NA 139 08/08/2020 0803   K 5.0 08/08/2020 0803   CL 112 (H) 08/08/2020 0803   CO2 34 (H) 08/08/2020 0803   GLUCOSE 130 (H) 08/08/2020 0803   GLUCOSE 123 (H) 11/17/2006 1240   BUN 10 08/08/2020 0803   CREATININE 0.60 08/08/2020 0803   CREATININE 0.81 09/16/2014 1759   CALCIUM 9.3 08/08/2020 0803   GFRNONAA >60 08/08/2020 0803   GFRNONAA 76 09/16/2014 1759   GFRAA >60 08/08/2020 0803   GFRAA 88 09/16/2014 1759   CrCl cannot be calculated (Patient's most recent lab result is older than the maximum 21 days allowed.).  COAG Lab Results  Component Value Date   INR 1.46 01/24/2016   INR 1.05 08/28/2014   INR 0.95 06/08/2014    Radiology No results found.   Assessment/Plan Essential hypertension blood pressure control important in reducing the progression of atherosclerotic disease. On appropriate oral medications.     DM (diabetes mellitus), type 2, uncontrolled w/ophthalmic complication (HCC) blood glucose control important in reducing the progression of atherosclerotic disease. Also, involved in wound healing. On  appropriate medications.   Hyperlipidemia lipid control important in reducing the progression of atherosclerotic disease. Continue statin therapy  Carotid stenosis, bilateral Carotid duplex today shows improvement in the right carotid velocities down to the 40-59% range and a widely patent left carotid stent.  Overall doing well.  I would continue her current medical regimen.  Recheck in 6 months.    Leotis Pain, MD  12/18/2021 9:50 AM    This note was created with Dragon medical transcription system.  Any errors from dictation are purely unintentional

## 2021-12-18 NOTE — Assessment & Plan Note (Signed)
Carotid duplex today shows improvement in the right carotid velocities down to the 40-59% range and a widely patent left carotid stent.  Overall doing well.  I would continue her current medical regimen.  Recheck in 6 months.

## 2022-01-22 DIAGNOSIS — H2513 Age-related nuclear cataract, bilateral: Secondary | ICD-10-CM | POA: Diagnosis not present

## 2022-01-22 DIAGNOSIS — H18413 Arcus senilis, bilateral: Secondary | ICD-10-CM | POA: Diagnosis not present

## 2022-01-22 DIAGNOSIS — H35373 Puckering of macula, bilateral: Secondary | ICD-10-CM | POA: Diagnosis not present

## 2022-01-22 DIAGNOSIS — H25013 Cortical age-related cataract, bilateral: Secondary | ICD-10-CM | POA: Diagnosis not present

## 2022-01-22 DIAGNOSIS — H2511 Age-related nuclear cataract, right eye: Secondary | ICD-10-CM | POA: Diagnosis not present

## 2022-02-01 DIAGNOSIS — R8290 Unspecified abnormal findings in urine: Secondary | ICD-10-CM | POA: Diagnosis not present

## 2022-02-01 DIAGNOSIS — Z79899 Other long term (current) drug therapy: Secondary | ICD-10-CM | POA: Diagnosis not present

## 2022-02-01 DIAGNOSIS — E7800 Pure hypercholesterolemia, unspecified: Secondary | ICD-10-CM | POA: Diagnosis not present

## 2022-02-01 DIAGNOSIS — E118 Type 2 diabetes mellitus with unspecified complications: Secondary | ICD-10-CM | POA: Diagnosis not present

## 2022-02-08 DIAGNOSIS — F419 Anxiety disorder, unspecified: Secondary | ICD-10-CM | POA: Diagnosis not present

## 2022-02-08 DIAGNOSIS — E118 Type 2 diabetes mellitus with unspecified complications: Secondary | ICD-10-CM | POA: Diagnosis not present

## 2022-02-08 DIAGNOSIS — Z789 Other specified health status: Secondary | ICD-10-CM | POA: Diagnosis not present

## 2022-02-08 DIAGNOSIS — I10 Essential (primary) hypertension: Secondary | ICD-10-CM | POA: Diagnosis not present

## 2022-02-08 DIAGNOSIS — F32A Depression, unspecified: Secondary | ICD-10-CM | POA: Diagnosis not present

## 2022-02-08 DIAGNOSIS — E7800 Pure hypercholesterolemia, unspecified: Secondary | ICD-10-CM | POA: Diagnosis not present

## 2022-03-16 ENCOUNTER — Encounter: Payer: Self-pay | Admitting: Podiatry

## 2022-03-16 ENCOUNTER — Ambulatory Visit (INDEPENDENT_AMBULATORY_CARE_PROVIDER_SITE_OTHER): Payer: HMO | Admitting: Podiatry

## 2022-03-16 DIAGNOSIS — M2011 Hallux valgus (acquired), right foot: Secondary | ICD-10-CM | POA: Diagnosis not present

## 2022-03-16 DIAGNOSIS — M2041 Other hammer toe(s) (acquired), right foot: Secondary | ICD-10-CM

## 2022-03-16 DIAGNOSIS — E559 Vitamin D deficiency, unspecified: Secondary | ICD-10-CM

## 2022-03-20 ENCOUNTER — Encounter: Payer: Self-pay | Admitting: Podiatry

## 2022-03-20 NOTE — Progress Notes (Signed)
?  Subjective:  ?Patient ID: [name], female    DOB: 11-16-1946,  MRN: [id number] ? ?Chief Complaint  ?Patient presents with  ? Consult  ?  Discuss surgery for right foot - bunion and hammertoe  ? ? ?76 y.o. female presents with the above complaint. History confirmed with patient.  She is referred to me by Dr. Milinda Pointer for consultation for surgery on her right foot bunion and second hammertoe.  She previously had surgery on her left foot many years ago and it was difficult as far as her recovery and quite painful.  She is avoided surgery on the right foot so far but now it is becoming increasingly painful.  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 ?Objective:  ?Physical Exam: ?warm, good capillary refill, no trophic changes or ulcerative lesions, normal DP and PT pulses, normal sensory exam, and on her right foot she has hallux valgus with hypermobility of the first ray deviation of the hallux and hammertoe deformity of the second toe. ? ?Radiographs: ?Multiple views x-ray of the right foot: Hallux valgus deformity, there is a elongated second ray and hammertoe deformity.  There is also a tailor's bunion. ?Assessment:  ? ?1. Acquired hallux valgus of right foot   ?2. Hammer toe of right foot   ?3. Vitamin D deficiency   ? ? ? ?Plan:  ?Patient was evaluated and treated and all questions answered. ? ?Discussed the etiology and treatment including surgical and non surgical treatment for painful bunions and hammertoes.  Her tailor's bunion is not painful and does not desire surgical correction here.  She has exhausted all non surgical treatment prior to this visit including shoe gear changes and padding.  desires surgical intervention. We discussed all risks including but not limited to: pain, swelling, infection, scar, numbness which may be temporary or permanent, chronic pain, stiffness, nerve pain or damage, wound healing problems, bone healing problems including delayed or non-union and  recurrence. Specifically we discussed the following procedures: Lapidus arthrodesis with bone graft from the heel, Weil osteotomy and second hammertoe correction all of the right foot. Informed consent was signed today. Surgery will be scheduled at a mutually agreeable date. Information regarding this will be forwarded to our surgery scheduler. ? ?She has not had her vitamin D checked in some time and I have ordered a lab for this and her calcium. ? ?Surgical plan: ? ?Procedure: ?-Right foot Lapidus arthrodesis bone graft from the heel second hammertoe correction Weil osteotomy second ? ?Location: ?-[facility] ? ?Anesthesia plan: ?-IV sedation with a regional block ? ?Postoperative pain plan: ?- Tylenol 1000 mg every 6 hours, ibuprofen 600 mg every 6 hours, gabapentin 300 mg every 8 hours x5 days, oxycodone 5 mg 1-2 tabs every 6 hours only as needed ? ?DVT prophylaxis: ?-ASA 325 mg twice daily ? ?WB Restrictions / DME needs: ?-NWB in CAM boot this was dispensed today ? ?Return for After Surgery.  ? ?

## 2022-04-05 DIAGNOSIS — H2513 Age-related nuclear cataract, bilateral: Secondary | ICD-10-CM | POA: Diagnosis not present

## 2022-04-05 DIAGNOSIS — H2511 Age-related nuclear cataract, right eye: Secondary | ICD-10-CM | POA: Diagnosis not present

## 2022-04-06 DIAGNOSIS — H2512 Age-related nuclear cataract, left eye: Secondary | ICD-10-CM | POA: Diagnosis not present

## 2022-04-15 ENCOUNTER — Telehealth: Payer: Self-pay | Admitting: *Deleted

## 2022-04-15 NOTE — Telephone Encounter (Signed)
Patient is calling because for the last 2 days , right foot has so painful, unable to use it, feels like an elephant or car ran over it. ? She has to walk on her heel. She fell on her deck 2 night ago. ?Patient needs to be scheduled for an evaluation (possible injury). ?

## 2022-04-16 ENCOUNTER — Telehealth: Payer: Self-pay | Admitting: *Deleted

## 2022-04-16 NOTE — Telephone Encounter (Signed)
LMOM with the instructions the Dr Sherryle Lis gave, also left our number in case she has questions or needs appointment.

## 2022-04-16 NOTE — Telephone Encounter (Signed)
Patient came in and picked a boot, said that she did not get one at her last appointment. ?

## 2022-04-19 DIAGNOSIS — H52202 Unspecified astigmatism, left eye: Secondary | ICD-10-CM | POA: Diagnosis not present

## 2022-04-19 DIAGNOSIS — H2512 Age-related nuclear cataract, left eye: Secondary | ICD-10-CM | POA: Diagnosis not present

## 2022-04-19 DIAGNOSIS — H2513 Age-related nuclear cataract, bilateral: Secondary | ICD-10-CM | POA: Diagnosis not present

## 2022-04-20 ENCOUNTER — Ambulatory Visit (INDEPENDENT_AMBULATORY_CARE_PROVIDER_SITE_OTHER): Payer: HMO

## 2022-04-20 ENCOUNTER — Ambulatory Visit (INDEPENDENT_AMBULATORY_CARE_PROVIDER_SITE_OTHER): Payer: HMO | Admitting: Podiatry

## 2022-04-20 DIAGNOSIS — S99921A Unspecified injury of right foot, initial encounter: Secondary | ICD-10-CM

## 2022-04-20 DIAGNOSIS — M2041 Other hammer toe(s) (acquired), right foot: Secondary | ICD-10-CM

## 2022-04-20 DIAGNOSIS — S93124A Dislocation of metatarsophalangeal joint of right lesser toe(s), initial encounter: Secondary | ICD-10-CM | POA: Diagnosis not present

## 2022-04-20 DIAGNOSIS — M2011 Hallux valgus (acquired), right foot: Secondary | ICD-10-CM

## 2022-04-20 MED ORDER — TRAMADOL HCL 50 MG PO TABS
50.0000 mg | ORAL_TABLET | Freq: Four times a day (QID) | ORAL | 0 refills | Status: DC | PRN
Start: 2022-04-20 — End: 2022-05-04

## 2022-04-20 NOTE — Progress Notes (Signed)
?  Subjective:  ?Patient ID: [name], female    DOB: 02-11-1946,  MRN: [id number] ? ?Chief Complaint  ?Patient presents with  ? Bunions  ?  Right foot post op  ? ? ?76 y.o. female presents with the above complaint. History confirmed with patient.  She is referred to me by Dr. Milinda Pointer for consultation for surgery on her right foot bunion and second hammertoe.  She previously had surgery on her left foot many years ago and it was difficult as far as her recovery and quite painful.  She is avoided surgery on the right foot so far but now it is becoming increasingly painful.  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 ? ?Interval history: ?Since last visit she had been struggling while on vacation and at some point noticed severe sharp pain in swelling around the toes and the forefoot ? ?Objective:  ?Physical Exam: ?warm, good capillary refill, no trophic changes or ulcerative lesions, normal DP and PT pulses, normal sensory exam, and on her right foot she has hallux valgus with hypermobility of the first ray deviation of the hallux and hammertoe deformity of the second toe.  She has sharp pain on palpation to the plantar plate of the second and third toes there is contracture of the toes and quite a bit of swelling and edema here ? ?Radiographs: ?Multiple views x-ray of the right foot: New films taken today show maintained alignment of hallux valgus deformity, elongated second ray and hammertoe deformities there is dorsal and lateral dislocation and subluxation of the second and third MTPJ's ?Assessment:  ? ?1. Injury of plantar plate of right foot, initial encounter   ?2. Acquired hallux valgus of right foot   ?3. Hammer toe of right foot   ?4. Dislocation of metatarsophalangeal joint of lesser toe, right, initial encounter   ? ? ? ?Plan:  ?Patient was evaluated and treated and all questions answered. ? ?We reviewed her x-rays together today.  I discussed with her that she likely has plantar  plate disruption.  I am ordering a stat MRI to evaluate this.  She has upcoming foot surgery for her bunion, I expect we will need to also address plantar plate injury and possible metatarsal osteotomies simultaneously.  We may need to move her surgery up if this is more acute before there is significant articular erosions present in the MTPJ's.  I will discuss with her further after the MRI.  CAM boot was dispensed to support the foot I also showed her how to tape and strap the toes down to maintain position of the toes in a more anatomic alignment.  Strapping was applied today with athletic training tape to plantarflex the MTPJ's ? ?Return for i will message / call after MRI to review.  ? ?

## 2022-04-20 NOTE — Patient Instructions (Signed)
Call [facility] at [phone] to schedule your MRI ? ?

## 2022-04-22 ENCOUNTER — Ambulatory Visit
Admission: RE | Admit: 2022-04-22 | Discharge: 2022-04-22 | Disposition: A | Discharge status: Home / Self Care | Payer: HMO | Source: Ambulatory Visit | Attending: Podiatry | Admitting: Podiatry

## 2022-04-22 DIAGNOSIS — S99921A Unspecified injury of right foot, initial encounter: Secondary | ICD-10-CM

## 2022-04-22 DIAGNOSIS — S93601A Unspecified sprain of right foot, initial encounter: Secondary | ICD-10-CM | POA: Diagnosis not present

## 2022-04-22 DIAGNOSIS — S93124A Dislocation of metatarsophalangeal joint of right lesser toe(s), initial encounter: Secondary | ICD-10-CM | POA: Diagnosis not present

## 2022-04-22 DIAGNOSIS — M19071 Primary osteoarthritis, right ankle and foot: Secondary | ICD-10-CM | POA: Diagnosis not present

## 2022-04-22 DIAGNOSIS — M7989 Other specified soft tissue disorders: Secondary | ICD-10-CM | POA: Diagnosis not present

## 2022-04-26 ENCOUNTER — Telehealth: Payer: Self-pay | Admitting: *Deleted

## 2022-04-26 ENCOUNTER — Encounter: Payer: Self-pay | Admitting: Podiatry

## 2022-04-26 NOTE — Telephone Encounter (Signed)
Patient is calling for the MRI results done on Thursday. Please advise. ?

## 2022-04-27 NOTE — Telephone Encounter (Signed)
I called the patient on 04/26/2022 around 5:30 PM to review her MRI results.  Went to [employer], I left a voice message as well as sent a MyChart message with her results. ?

## 2022-05-03 ENCOUNTER — Other Ambulatory Visit: Payer: Self-pay | Admitting: Podiatry

## 2022-05-04 NOTE — Telephone Encounter (Signed)
Please advise 

## 2022-05-26 DIAGNOSIS — E1151 Type 2 diabetes mellitus with diabetic peripheral angiopathy without gangrene: Secondary | ICD-10-CM | POA: Diagnosis not present

## 2022-05-26 DIAGNOSIS — E7800 Pure hypercholesterolemia, unspecified: Secondary | ICD-10-CM | POA: Diagnosis not present

## 2022-05-26 DIAGNOSIS — Z789 Other specified health status: Secondary | ICD-10-CM | POA: Diagnosis not present

## 2022-05-26 DIAGNOSIS — I10 Essential (primary) hypertension: Secondary | ICD-10-CM | POA: Diagnosis not present

## 2022-05-31 ENCOUNTER — Telehealth: Payer: Self-pay | Admitting: Urology

## 2022-05-31 NOTE — Telephone Encounter (Signed)
DOS - 06/25/22  LAPIDUS PROCEDURE INCLUDING BUNIONECTOMY RIGHT --- [id number] [name] OSTEOTOMY RIGHT --- 46047 METATARSAL OSTEOTOMY 2ND, 3RD RIGHT --- 28308 HAMMERTOE REPAIR 2ND, 3RD RIGHT --- 99872 BONE GRAFT RIGHT --- 20900 RECONSTRUCTION 2ND, 3RD RIGHT --- [phone]  HTA EFFECTIVE DATE - 12/13/21  RECEIVED FAX FROM [facility] STATING THAT CPT CODES 76184, 85927, 28308, 63943, 20900 AND 20037 HAS BEEN APPROVED, AUTH # [id number], GOOD FROM 06/25/22 - 09/23/22.

## 2022-06-18 ENCOUNTER — Ambulatory Visit (INDEPENDENT_AMBULATORY_CARE_PROVIDER_SITE_OTHER): Payer: HMO | Admitting: Vascular Surgery

## 2022-06-18 ENCOUNTER — Encounter (INDEPENDENT_AMBULATORY_CARE_PROVIDER_SITE_OTHER): Payer: HMO

## 2022-06-18 DIAGNOSIS — E559 Vitamin D deficiency, unspecified: Secondary | ICD-10-CM | POA: Diagnosis not present

## 2022-06-19 LAB — VITAMIN D 25 HYDROXY (VIT D DEFICIENCY, FRACTURES): Vit D, 25-Hydroxy: 24.7 ng/mL — ABNORMAL LOW (ref 30.0–100.0)

## 2022-06-19 LAB — CALCIUM: Calcium: 9.8 mg/dL (ref 8.7–10.3)

## 2022-06-21 ENCOUNTER — Other Ambulatory Visit: Payer: Self-pay | Admitting: Podiatry

## 2022-06-21 MED ORDER — VITAMIN D (ERGOCALCIFEROL) 1.25 MG (50000 UNIT) PO CAPS: 50000.0000 [IU] | ORAL_CAPSULE | ORAL | 0 refills | Status: DC

## 2022-06-25 ENCOUNTER — Other Ambulatory Visit: Payer: Self-pay | Admitting: Podiatry

## 2022-06-25 DIAGNOSIS — M205X1 Other deformities of toe(s) (acquired), right foot: Secondary | ICD-10-CM | POA: Diagnosis not present

## 2022-06-25 DIAGNOSIS — M7741 Metatarsalgia, right foot: Secondary | ICD-10-CM | POA: Diagnosis not present

## 2022-06-25 DIAGNOSIS — M2041 Other hammer toe(s) (acquired), right foot: Secondary | ICD-10-CM | POA: Diagnosis not present

## 2022-06-25 DIAGNOSIS — M2011 Hallux valgus (acquired), right foot: Secondary | ICD-10-CM | POA: Diagnosis not present

## 2022-06-25 DIAGNOSIS — Y929 Unspecified place or not applicable: Secondary | ICD-10-CM | POA: Diagnosis not present

## 2022-06-25 DIAGNOSIS — Y939 Activity, unspecified: Secondary | ICD-10-CM | POA: Diagnosis not present

## 2022-06-25 DIAGNOSIS — M89771 Major osseous defect, right ankle and foot: Secondary | ICD-10-CM | POA: Diagnosis not present

## 2022-06-25 DIAGNOSIS — Y999 Unspecified external cause status: Secondary | ICD-10-CM | POA: Diagnosis not present

## 2022-06-25 DIAGNOSIS — M21541 Acquired clubfoot, right foot: Secondary | ICD-10-CM | POA: Diagnosis not present

## 2022-06-25 DIAGNOSIS — S93524A Sprain of metatarsophalangeal joint of right lesser toe(s), initial encounter: Secondary | ICD-10-CM | POA: Diagnosis not present

## 2022-06-25 MED ORDER — ACETAMINOPHEN 500 MG PO TABS: 1000.0000 mg | ORAL_TABLET | Freq: Four times a day (QID) | ORAL | 0 refills | Status: AC | PRN

## 2022-06-25 MED ORDER — GABAPENTIN 300 MG PO CAPS: 300.0000 mg | ORAL_CAPSULE | Freq: Three times a day (TID) | ORAL | 0 refills | Status: DC

## 2022-06-25 MED ORDER — OXYCODONE HCL 5 MG PO TABS: 5.0000 mg | ORAL_TABLET | ORAL | 0 refills | Status: AC | PRN

## 2022-06-25 NOTE — Addendum Note (Signed)
Addended bySherryle Lis, Danyl Deems R on: 06/25/2022 02:40 PM   Modules accepted: Orders

## 2022-06-25 NOTE — Progress Notes (Signed)
06/25/22 right foot Lapidus, Akin, Weil and Hammertoe 2/3

## 2022-07-01 ENCOUNTER — Ambulatory Visit (INDEPENDENT_AMBULATORY_CARE_PROVIDER_SITE_OTHER): Payer: PPO

## 2022-07-01 ENCOUNTER — Ambulatory Visit (INDEPENDENT_AMBULATORY_CARE_PROVIDER_SITE_OTHER): Payer: PPO | Admitting: Podiatry

## 2022-07-01 DIAGNOSIS — M2041 Other hammer toe(s) (acquired), right foot: Secondary | ICD-10-CM

## 2022-07-01 DIAGNOSIS — S93124A Dislocation of metatarsophalangeal joint of right lesser toe(s), initial encounter: Secondary | ICD-10-CM

## 2022-07-01 DIAGNOSIS — M2011 Hallux valgus (acquired), right foot: Secondary | ICD-10-CM

## 2022-07-01 DIAGNOSIS — S99921A Unspecified injury of right foot, initial encounter: Secondary | ICD-10-CM

## 2022-07-02 ENCOUNTER — Ambulatory Visit (INDEPENDENT_AMBULATORY_CARE_PROVIDER_SITE_OTHER): Payer: HMO | Admitting: Vascular Surgery

## 2022-07-02 ENCOUNTER — Encounter (INDEPENDENT_AMBULATORY_CARE_PROVIDER_SITE_OTHER): Payer: HMO

## 2022-07-02 NOTE — Progress Notes (Unsigned)
  Subjective:  Patient ID: [name], female    DOB: 1946/06/02,  MRN: [id number]  Chief Complaint  Patient presents with   Routine Post Op      POV #1 DOS 06/25/2022 [place] PROCEDURE BUNIONECTOMY,RIGHT, Barbie Banner OSTEOTOMY, 2ND HAMMERTOE CORRECTION, SHORTENING 2ND METATARSAL, BONE GRAFT      76 y.o. female returns for post-op check. Overall she is doing well having minimal pain   Review of Systems: Negative except as noted in the HPI. Denies N/V/F/Ch.   Objective:  There were no vitals filed for this visit. There is no height or weight on file to calculate BMI. Constitutional Well developed. Well nourished.  Vascular Foot warm and well perfused. Capillary refill normal to all digits.  Calf is soft and supple, no posterior calf or knee pain, negative Homans' sign  Neurologic Normal speech. Oriented to person, place, and time. Epicritic sensation to light touch grossly present bilaterally.  Dermatologic Skin healing well without signs of infection. Skin edges well coapted without signs of infection.  Orthopedic: Tenderness to palpation noted about the surgical site. Moderate edema   Multiple view plain film radiographs: good correction noted with all hardware intact and in good position  Assessment:   1. Acquired hallux valgus of right foot   2. Hammer toe of right foot   3. Injury of plantar plate of right foot, initial encounter   4. Dislocation of metatarsophalangeal joint of lesser toe, right, initial encounter    Plan:  Patient was evaluated and treated and all questions answered.  S/p foot surgery right -Progressing as expected post-operatively. -XR: noted above, equivalent to immediate postoperative films -WB Status: NWB in CAM boot with knee scooter, she may gradually use heel for transfers -Sutures: plan to remove in 2 weeks. -Medications:  -Foot redressed.  Return in about 2 weeks (around 07/15/2022) for post op (no x-rays), suture removal.

## 2022-07-15 ENCOUNTER — Ambulatory Visit (INDEPENDENT_AMBULATORY_CARE_PROVIDER_SITE_OTHER): Payer: HMO | Admitting: Podiatry

## 2022-07-15 ENCOUNTER — Telehealth: Payer: Self-pay | Admitting: *Deleted

## 2022-07-15 DIAGNOSIS — M2011 Hallux valgus (acquired), right foot: Secondary | ICD-10-CM

## 2022-07-15 DIAGNOSIS — M2041 Other hammer toe(s) (acquired), right foot: Secondary | ICD-10-CM

## 2022-07-15 DIAGNOSIS — T8189XA Other complications of procedures, not elsewhere classified, initial encounter: Secondary | ICD-10-CM

## 2022-07-15 MED ORDER — MUPIROCIN 2 % EX OINT: 1.0000 | TOPICAL_OINTMENT | Freq: Two times a day (BID) | CUTANEOUS | 2 refills | Status: DC

## 2022-07-15 MED ORDER — DOXYCYCLINE HYCLATE 100 MG PO TABS: 100.0000 mg | ORAL_TABLET | Freq: Two times a day (BID) | ORAL | 0 refills | Status: DC

## 2022-07-15 NOTE — Telephone Encounter (Signed)
Patient is calling to ask physician if she may pivot a little more with the soft case. Please advise.

## 2022-07-15 NOTE — Telephone Encounter (Signed)
Patient is wanting to put  a little more pressure on the heel rather than the scooter to transfer, scooter is causing a lot of pain with her knee.

## 2022-07-16 NOTE — Telephone Encounter (Signed)
Called patient, no answer, left vmessage

## 2022-07-19 NOTE — Progress Notes (Signed)
  Subjective:  Patient ID: [name], female    DOB: 01/09/1946,  MRN: [id number]  Chief Complaint  Patient presents with   Routine Post Op    POV #2 DOS 06/25/2022 [place] PROCEDURE BUNIONECTOMY, Barbie Banner OSTEOTOMY, 2ND HAMMERTOE CORRECTION, SHORTENING 2ND METATARSAL, BONE GRAFT      76 y.o. female returns for post-op check.    Review of Systems: Negative except as noted in the HPI. Denies N/V/F/Ch.   Objective:  There were no vitals filed for this visit. There is no height or weight on file to calculate BMI. Constitutional Well developed. Well nourished.  Vascular Foot warm and well perfused. Capillary refill normal to all digits.  Calf is soft and supple, no posterior calf or knee pain, negative Homans' sign  Neurologic Normal speech. Oriented to person, place, and time. Epicritic sensation to light touch grossly present bilaterally.  Dermatologic Incisions are well-healed with the exception of the distal 1 to 2 cm of the bony incision which has some superficial delayed healing.  No drainage or erythema or signs of infection.  Orthopedic: Tenderness to palpation noted about the surgical site. Moderate edema   Multiple view plain film radiographs: good correction noted with all hardware intact and in good position  Assessment:   1. Acquired hallux valgus of right foot   2. Hammer toe of right foot   3. Delayed surgical wound healing, initial encounter    Plan:  Patient was evaluated and treated and all questions answered.  S/p foot surgery right -All sutures removed today except for the distal portion of the bunion incision.  She may continue to use the heel for weightbearing and transfers.  Rx for doxycycline and mupirocin sent she will change the dressing.  I will see her back in 12 days for reevaluation.  Return in about 12 days (around 07/27/2022) for post op (no x-rays).

## 2022-07-27 ENCOUNTER — Encounter (INDEPENDENT_AMBULATORY_CARE_PROVIDER_SITE_OTHER): Payer: Self-pay | Admitting: Vascular Surgery

## 2022-07-27 ENCOUNTER — Ambulatory Visit (INDEPENDENT_AMBULATORY_CARE_PROVIDER_SITE_OTHER): Payer: HMO | Admitting: Vascular Surgery

## 2022-07-27 ENCOUNTER — Ambulatory Visit (INDEPENDENT_AMBULATORY_CARE_PROVIDER_SITE_OTHER): Payer: HMO

## 2022-07-27 VITALS — BP 99/66 | HR 97 | Resp 16 | Wt 160.0 lb

## 2022-07-27 DIAGNOSIS — I6523 Occlusion and stenosis of bilateral carotid arteries: Secondary | ICD-10-CM | POA: Diagnosis not present

## 2022-07-27 DIAGNOSIS — E785 Hyperlipidemia, unspecified: Secondary | ICD-10-CM | POA: Diagnosis not present

## 2022-07-27 DIAGNOSIS — E1139 Type 2 diabetes mellitus with other diabetic ophthalmic complication: Secondary | ICD-10-CM | POA: Diagnosis not present

## 2022-07-27 DIAGNOSIS — I10 Essential (primary) hypertension: Secondary | ICD-10-CM

## 2022-07-27 NOTE — Progress Notes (Signed)
MRN : [id number]  [name] is a 76 y.o. (28-Nov-1946) female who presents with chief complaint of  Chief Complaint  Patient presents with   Follow-up    Ultrasound follow up  .  History of Present Illness: Patient returns today in follow up of her carotid disease.  She is roughly 2 years status post left carotid artery stent placement.  She is doing well.  She is tolerating dual antiplatelet therapy without any bleeding issues.  Her biggest complaint currently is of a recent foot and ankle injury which required a major surgery and she is still in a walking boot.  No focal neurologic symptoms. Specifically, the patient denies amaurosis fugax, speech or swallowing difficulties, or arm or leg weakness or numbness. Carotid duplex today shows continued improvement in the right ICA velocities now in the 1 to 39% range.  Her left carotid stent is widely patent.  Current Outpatient Medications  Medication Sig Dispense Refill   aspirin EC 81 MG EC tablet Take 1 tablet (81 mg total) by mouth daily. Swallow whole. 90 tablet 3   BYSTOLIC 5 MG tablet TAKE 1 TABLET DAILY 90 tablet 0   clopidogrel (PLAVIX) 75 MG tablet TAKE 1 TABLET BY MOUTH EVERY DAY 30 tablet 11   diphenoxylate-atropine (LOMOTIL) 2.5-0.025 MG tablet Take 1 tablet by mouth 4 (four) times daily as needed for diarrhea or loose stools.      doxycycline (VIBRA-TABS) 100 MG tablet Take 1 tablet (100 mg total) by mouth 2 (two) times daily. 28 tablet 0   Dulaglutide 0.75 MG/0.5ML SOPN Inject 3 mg into the skin once a week.      EPINEPHrine 0.3 mg/0.3 mL IJ SOAJ injection Inject into the muscle.     glipiZIDE (GLUCOTROL XL) 10 MG 24 hr tablet Take 1 tablet by mouth daily.     glucose blood (BAYER CONTOUR NEXT TEST) test strip Use to test blood sugar 3 times daily as instructed. Dx code: E11.65 100 each 11   Lancets (ACCU-CHEK MULTICLIX) lancets Use to test blood sugar 3 times daily as instructed. Dx: E11.65 100 each 11   lisdexamfetamine  (VYVANSE) 50 MG capsule Take 50 mg by mouth daily.     lisinopril (PRINIVIL,ZESTRIL) 5 MG tablet TAKE 1 TABLET BY MOUTH EVERY DAY 90 tablet 0   LORazepam (ATIVAN) 0.5 MG tablet Take 1 tablet (0.5 mg total) by mouth daily as needed for anxiety. 20 tablet 0   metFORMIN (GLUCOPHAGE) 1000 MG tablet SMARTSIG:1 By Mouth     mupirocin ointment (BACTROBAN) 2 % Apply 1 Application topically 2 (two) times daily. 30 g 2   omeprazole (PRILOSEC) 40 MG capsule TK 1 C PO D  2   pregabalin (LYRICA) 300 MG capsule Take 300 mg by mouth 2 (two) times daily.     rosuvastatin (CRESTOR) 5 MG tablet Take by mouth.     sitaGLIPtin (JANUVIA) 100 MG tablet TAKE 1 TABLET BY MOUTH EVERY DAY.     traMADol (ULTRAM) 50 MG tablet TAKE 1 TABLET(50 MG) BY MOUTH EVERY 6 HOURS FOR UP TO 5 DAYS AS NEEDED 20 tablet 0   Vitamin D, Ergocalciferol, (DRISDOL) 1.25 MG (50000 UNIT) CAPS capsule Take 1 capsule (50,000 Units total) by mouth every 7 (seven) days for 8 doses. 8 capsule 0   No current facility-administered medications for this visit.    Past Medical History:  Diagnosis Date   Alcohol abuse    CAP (community acquired pneumonia) 05/05/2018   Carotid artery  stenosis    Depression    Diabetes mellitus    Diverticulitis    Fibromyalgia    Hepatitis 08/28/2014   Hypertension    Myalgia and myositis 03/28/2012    Past Surgical History:  Procedure Laterality Date   BUNIONECTOMY     CAROTID PTA/STENT INTERVENTION Left 08/07/2020   Procedure: CAROTID PTA/STENT INTERVENTION;  Surgeon: Algernon Huxley, MD;  Location: [facility];  Service: Cardiovascular;  Laterality: Left;   CESAREAN SECTION     COLONOSCOPY     COLONOSCOPY WITH ESOPHAGOGASTRODUODENOSCOPY (EGD)     COLONOSCOPY WITH PROPOFOL N/A 02/18/2021   Procedure: COLONOSCOPY WITH PROPOFOL;  Surgeon: Toledo, Benay Pike, MD;  Location: [facility];  Service: Gastroenterology;  Laterality: N/A;   ESOPHAGOGASTRODUODENOSCOPY (EGD) WITH PROPOFOL N/A 02/18/2021    Procedure: ESOPHAGOGASTRODUODENOSCOPY (EGD) WITH PROPOFOL;  Surgeon: Toledo, Benay Pike, MD;  Location: [facility];  Service: Gastroenterology;  Laterality: N/A;   TONSILLECTOMY       Social History   Tobacco Use   Smoking status: Former    Types: Cigarettes    Quit date: 03/11/2010    Years since quitting: 12.3   Smokeless tobacco: Never  Vaping Use   Vaping Use: Former  Substance Use Topics   Alcohol use: Not Currently   Drug use: No    Comment: Pt denies       Family History  Problem Relation Age of Onset   Cancer Mother    Heart disease Father    Heart disease Sister      Allergies  Allergen Reactions   Latex Hives   Lipitor [Atorvastatin Calcium] Other (See Comments)    Muscle aches   Codeine Rash    And nausea    Pentazocine Lactate Rash   Sulfonamide Derivatives Rash    REVIEW OF SYSTEMS (Negative unless checked)   Constitutional: '[]'$ Weight loss  '[]'$ Fever  '[]'$ Chills Cardiac: '[]'$ Chest pain   '[x]'$ Chest pressure   '[]'$ Palpitations   '[]'$ Shortness of breath when laying flat   '[]'$ Shortness of breath at rest   '[x]'$ Shortness of breath with exertion. Vascular:  '[]'$ Pain in legs with walking   '[]'$ Pain in legs at rest   '[]'$ Pain in legs when laying flat   '[]'$ Claudication   '[]'$ Pain in feet when walking  '[]'$ Pain in feet at rest  '[]'$ Pain in feet when laying flat   '[]'$ History of DVT   '[]'$ Phlebitis   '[]'$ Swelling in legs   '[]'$ Varicose veins   '[]'$ Non-healing ulcers Pulmonary:   '[]'$ Uses home oxygen   '[]'$ Productive cough   '[]'$ Hemoptysis   '[]'$ Wheeze  '[]'$ COPD   '[]'$ Asthma Neurologic:  '[]'$ Dizziness  '[]'$ Blackouts   '[]'$ Seizures   '[]'$ History of stroke   '[x]'$ History of TIA  '[]'$ Aphasia   '[]'$ Temporary blindness   '[]'$ Dysphagia   '[]'$ Weakness or numbness in arms   '[]'$ Weakness or numbness in legs Musculoskeletal:  '[]'$ Arthritis   '[]'$ Joint swelling   '[]'$ Joint pain   '[]'$ Low back pain Hematologic:  '[]'$ Easy bruising  '[]'$ Easy bleeding   '[]'$ Hypercoagulable state   '[]'$ Anemic  '[]'$ Hepatitis Gastrointestinal:  '[]'$ Blood in stool   '[]'$ Vomiting blood   '[]'$ Gastroesophageal reflux/heartburn   '[]'$ Abdominal pain Genitourinary:  '[]'$ Chronic kidney disease   '[]'$ Difficult urination  '[]'$ Frequent urination  '[]'$ Burning with urination   '[]'$ Hematuria Skin:  '[]'$ Rashes   '[]'$ Ulcers   '[]'$ Wounds Psychological:  '[x]'$ History of anxiety   '[x]'$  History of major depression.  Physical Examination  BP 99/66 (BP Location: Right Arm)   Pulse 97   Resp 16   Wt 160 lb (72.6 kg)  BMI 28.34 kg/m  Gen:  WD/WN, NAD Head: [place]/AT, No temporalis wasting. Ear/Nose/Throat: Hearing grossly intact, nares w/o erythema or drainage Eyes: Conjunctiva clear. Sclera non-icteric Neck: Supple.  Trachea midline Pulmonary:  Good air movement, no use of accessory muscles.  Cardiac: RRR, no JVD Vascular:  Vessel Right Left  Radial Palpable Palpable               Musculoskeletal: M/S 5/5 throughout.  No deformity or atrophy.  Right foot is currently in a walking postoperative boot.  Moderate right lower extremity edema. Neurologic: Sensation grossly intact in extremities.  Symmetrical.  Speech is fluent.  Psychiatric: Judgment intact, Mood & affect appropriate for pt's clinical situation. Dermatologic: No rashes or ulcers noted.  No cellulitis or open wounds.      Labs Recent Results (from the past 2160 hour(s))  Vitamin D, 25-hydroxy     Status: Abnormal   Collection Time: 06/18/22  2:28 PM  Result Value Ref Range   Vit D, 25-Hydroxy 24.7 (L) 30.0 - 100.0 ng/mL    Comment: Vitamin D deficiency has been defined by the [place] practice guideline as a level of serum 25-OH vitamin D less than 20 ng/mL (1,2). The Endocrine Society went on to further define vitamin D insufficiency as a level between 21 and 29 ng/mL (2). 1. IOM (Institute of Medicine). 2010. Dietary reference    intakes for calcium and D. [place]: The    [employer]. 2. Holick MF, Binkley Humphrey, Bischoff-Ferrari HA, et al.    Evaluation, treatment, and prevention  of vitamin D    deficiency: an Endocrine Society clinical practice    guideline. JCEM. 2011 Jul; 96(7):1911-30.   Calcium     Status: None   Collection Time: 06/18/22  2:28 PM  Result Value Ref Range   Calcium 9.8 8.7 - 10.3 mg/dL    Radiology DG Foot Complete Right  Result Date: 07/08/2022 Please see detailed radiograph report in office note.   Assessment/Plan Essential hypertension blood pressure control important in reducing the progression of atherosclerotic disease. On appropriate oral medications.     DM (diabetes mellitus), type 2, uncontrolled w/ophthalmic complication (HCC) blood glucose control important in reducing the progression of atherosclerotic disease. Also, involved in wound healing. On appropriate medications.   Hyperlipidemia lipid control important in reducing the progression of atherosclerotic disease. Continue statin therapy  Carotid stenosis, bilateral Carotid duplex today shows continued improvement in the right ICA velocities now in the 1 to 39% range.  Her left carotid stent is widely patent.  Continue current medical regimen.  Recheck in 1 year.    Leotis Pain, MD  07/27/2022 2:15 PM    This note was created with Dragon medical transcription system.  Any errors from dictation are purely unintentional

## 2022-07-27 NOTE — Assessment & Plan Note (Signed)
Carotid duplex today shows continued improvement in the right ICA velocities now in the 1 to 39% range.  Her left carotid stent is widely patent.  Continue current medical regimen.  Recheck in 1 year.

## 2022-07-28 DIAGNOSIS — E7800 Pure hypercholesterolemia, unspecified: Secondary | ICD-10-CM | POA: Diagnosis not present

## 2022-07-28 DIAGNOSIS — Z79899 Other long term (current) drug therapy: Secondary | ICD-10-CM | POA: Diagnosis not present

## 2022-07-28 DIAGNOSIS — E118 Type 2 diabetes mellitus with unspecified complications: Secondary | ICD-10-CM | POA: Diagnosis not present

## 2022-07-28 DIAGNOSIS — I10 Essential (primary) hypertension: Secondary | ICD-10-CM | POA: Diagnosis not present

## 2022-08-02 ENCOUNTER — Ambulatory Visit (INDEPENDENT_AMBULATORY_CARE_PROVIDER_SITE_OTHER): Payer: HMO | Admitting: Podiatry

## 2022-08-02 ENCOUNTER — Encounter: Payer: Self-pay | Admitting: Podiatry

## 2022-08-02 DIAGNOSIS — M2041 Other hammer toe(s) (acquired), right foot: Secondary | ICD-10-CM

## 2022-08-02 DIAGNOSIS — M2011 Hallux valgus (acquired), right foot: Secondary | ICD-10-CM

## 2022-08-02 DIAGNOSIS — T8189XA Other complications of procedures, not elsewhere classified, initial encounter: Secondary | ICD-10-CM

## 2022-08-02 NOTE — Progress Notes (Signed)
  Subjective:  Patient ID: [name], female    DOB: December 02, 1946,  MRN: [id number]  Chief Complaint  Patient presents with   Routine Post Op    "It looks better."      76 y.o. female returns for post-op check.  Says it is doing better   Review of Systems: Negative except as noted in the HPI. Denies N/V/F/Ch.   Objective:  There were no vitals filed for this visit. There is no height or weight on file to calculate BMI. Constitutional Well developed. Well nourished.  Vascular Foot warm and well perfused. Capillary refill normal to all digits.  Calf is soft and supple, no posterior calf or knee pain, negative Homans' sign  Neurologic Normal speech. Oriented to person, place, and time. Epicritic sensation to light touch grossly present bilaterally.  Dermatologic Incisions are well-healed, small areas persistent scab on the distal hallux  Orthopedic: Minimal tenderness and edema today   Multiple view plain film radiographs: good correction noted with all hardware intact and in good position  Assessment:   1. Acquired hallux valgus of right foot   2. Hammer toe of right foot   3. Delayed surgical wound healing, initial encounter     Plan:  Patient was evaluated and treated and all questions answered.  S/p foot surgery right -Remaining sutures removed today, has improved quite a bit.  Continue mupirocin ointment to the distal portion of the hallux.  The remainder of the foot I would like her to begin washing and bathing, do not soak or submerge in water but can leave open to air.  Plan to return in 2 weeks for new x-rays and pin removal.  I discussed with her I like her to begin walking on the foot in the boot and begin range of motion of the toes as well  Return in about 2 weeks (around 08/16/2022) for post op (new x-rays).

## 2022-08-02 NOTE — Patient Instructions (Signed)
Begin showering the foot and washing gently. Apply the ointment and a bandaid to the site on the big toe. Do not soak the foot or submerge in water. Do not wrap in gauze and coban, use only the ACE wrap. You may walk in the boot

## 2022-08-04 DIAGNOSIS — T466X5A Adverse effect of antihyperlipidemic and antiarteriosclerotic drugs, initial encounter: Secondary | ICD-10-CM | POA: Insufficient documentation

## 2022-08-05 ENCOUNTER — Encounter: Payer: PPO | Admitting: Podiatry

## 2022-08-05 ENCOUNTER — Other Ambulatory Visit: Payer: Self-pay | Admitting: Internal Medicine

## 2022-08-05 DIAGNOSIS — Z1231 Encounter for screening mammogram for malignant neoplasm of breast: Secondary | ICD-10-CM

## 2022-08-15 ENCOUNTER — Emergency Department (HOSPITAL_COMMUNITY)
Admission: EM | Admit: 2022-08-15 | Discharge: 2022-08-16 | Disposition: A | Discharge status: Home / Self Care | Payer: HMO | Attending: Emergency Medicine | Admitting: Emergency Medicine

## 2022-08-15 ENCOUNTER — Encounter (HOSPITAL_COMMUNITY): Payer: Self-pay | Admitting: Emergency Medicine

## 2022-08-15 ENCOUNTER — Emergency Department (HOSPITAL_COMMUNITY): Payer: HMO

## 2022-08-15 ENCOUNTER — Other Ambulatory Visit: Payer: Self-pay

## 2022-08-15 DIAGNOSIS — I10 Essential (primary) hypertension: Secondary | ICD-10-CM | POA: Insufficient documentation

## 2022-08-15 DIAGNOSIS — R0602 Shortness of breath: Secondary | ICD-10-CM | POA: Diagnosis not present

## 2022-08-15 DIAGNOSIS — R29818 Other symptoms and signs involving the nervous system: Secondary | ICD-10-CM | POA: Diagnosis not present

## 2022-08-15 DIAGNOSIS — Z91040 Latex allergy status: Secondary | ICD-10-CM | POA: Insufficient documentation

## 2022-08-15 DIAGNOSIS — R531 Weakness: Secondary | ICD-10-CM | POA: Diagnosis not present

## 2022-08-15 DIAGNOSIS — Z7982 Long term (current) use of aspirin: Secondary | ICD-10-CM | POA: Diagnosis not present

## 2022-08-15 DIAGNOSIS — E119 Type 2 diabetes mellitus without complications: Secondary | ICD-10-CM | POA: Insufficient documentation

## 2022-08-15 DIAGNOSIS — I639 Cerebral infarction, unspecified: Secondary | ICD-10-CM | POA: Diagnosis not present

## 2022-08-15 DIAGNOSIS — R29810 Facial weakness: Secondary | ICD-10-CM | POA: Diagnosis not present

## 2022-08-15 DIAGNOSIS — G9341 Metabolic encephalopathy: Secondary | ICD-10-CM | POA: Diagnosis not present

## 2022-08-15 DIAGNOSIS — I6782 Cerebral ischemia: Secondary | ICD-10-CM | POA: Diagnosis not present

## 2022-08-15 DIAGNOSIS — J329 Chronic sinusitis, unspecified: Secondary | ICD-10-CM | POA: Diagnosis not present

## 2022-08-15 DIAGNOSIS — R2990 Unspecified symptoms and signs involving the nervous system: Secondary | ICD-10-CM

## 2022-08-15 DIAGNOSIS — J3489 Other specified disorders of nose and nasal sinuses: Secondary | ICD-10-CM | POA: Diagnosis not present

## 2022-08-15 DIAGNOSIS — R4701 Aphasia: Secondary | ICD-10-CM | POA: Insufficient documentation

## 2022-08-15 DIAGNOSIS — Z7984 Long term (current) use of oral hypoglycemic drugs: Secondary | ICD-10-CM | POA: Insufficient documentation

## 2022-08-15 DIAGNOSIS — R9431 Abnormal electrocardiogram [ECG] [EKG]: Secondary | ICD-10-CM | POA: Diagnosis not present

## 2022-08-15 DIAGNOSIS — R410 Disorientation, unspecified: Secondary | ICD-10-CM | POA: Diagnosis not present

## 2022-08-15 DIAGNOSIS — Z20822 Contact with and (suspected) exposure to covid-19: Secondary | ICD-10-CM | POA: Insufficient documentation

## 2022-08-15 DIAGNOSIS — Z79899 Other long term (current) drug therapy: Secondary | ICD-10-CM | POA: Diagnosis not present

## 2022-08-15 DIAGNOSIS — Z7902 Long term (current) use of antithrombotics/antiplatelets: Secondary | ICD-10-CM | POA: Insufficient documentation

## 2022-08-15 DIAGNOSIS — I6529 Occlusion and stenosis of unspecified carotid artery: Secondary | ICD-10-CM | POA: Diagnosis not present

## 2022-08-15 DIAGNOSIS — R7989 Other specified abnormal findings of blood chemistry: Secondary | ICD-10-CM | POA: Insufficient documentation

## 2022-08-15 DIAGNOSIS — Z794 Long term (current) use of insulin: Secondary | ICD-10-CM | POA: Insufficient documentation

## 2022-08-15 LAB — COMPREHENSIVE METABOLIC PANEL
ALT: 10 U/L (ref 0–44)
AST: 18 U/L (ref 15–41)
Albumin: 3.3 g/dL — ABNORMAL LOW (ref 3.5–5.0)
Alkaline Phosphatase: 56 U/L (ref 38–126)
Anion gap: 11 (ref 5–15)
BUN: 18 mg/dL (ref 8–23)
CO2: 20 mmol/L — ABNORMAL LOW (ref 22–32)
Calcium: 8.7 mg/dL — ABNORMAL LOW (ref 8.9–10.3)
Chloride: 106 mmol/L (ref 98–111)
Creatinine, Ser: 1.21 mg/dL — ABNORMAL HIGH (ref 0.44–1.00)
GFR, Estimated: 47 mL/min — ABNORMAL LOW (ref >60)
Glucose, Bld: 123 mg/dL — ABNORMAL HIGH (ref 70–99)
Potassium: 4 mmol/L (ref 3.5–5.1)
Sodium: 137 mmol/L (ref 135–145)
Total Bilirubin: 0.4 mg/dL (ref 0.3–1.2)
Total Protein: 5.9 g/dL — ABNORMAL LOW (ref 6.5–8.1)

## 2022-08-15 LAB — I-STAT CHEM 8, ED
BUN: 17 mg/dL (ref 8–23)
Calcium, Ion: 1.03 mmol/L — ABNORMAL LOW (ref 1.15–1.40)
Chloride: 105 mmol/L (ref 98–111)
Creatinine, Ser: 1.1 mg/dL — ABNORMAL HIGH (ref 0.44–1.00)
Glucose, Bld: 121 mg/dL — ABNORMAL HIGH (ref 70–99)
HCT: 32 % — ABNORMAL LOW (ref 36.0–46.0)
Hemoglobin: 10.9 g/dL — ABNORMAL LOW (ref 12.0–15.0)
Potassium: 4.2 mmol/L (ref 3.5–5.1)
Sodium: 138 mmol/L (ref 135–145)
TCO2: 20 mmol/L — ABNORMAL LOW (ref 22–32)

## 2022-08-15 LAB — DIFFERENTIAL
Abs Immature Granulocytes: 0.02 10*3/uL (ref 0.00–0.07)
Basophils Absolute: 0.1 10*3/uL (ref 0.0–0.1)
Basophils Relative: 1 %
Eosinophils Absolute: 0.5 10*3/uL (ref 0.0–0.5)
Eosinophils Relative: 8 %
Immature Granulocytes: 0 %
Lymphocytes Relative: 41 %
Lymphs Abs: 2.3 10*3/uL (ref 0.7–4.0)
Monocytes Absolute: 0.5 10*3/uL (ref 0.1–1.0)
Monocytes Relative: 9 %
Neutro Abs: 2.3 10*3/uL (ref 1.7–7.7)
Neutrophils Relative %: 41 %

## 2022-08-15 LAB — CBC
HCT: 32.6 % — ABNORMAL LOW (ref 36.0–46.0)
Hemoglobin: 10.3 g/dL — ABNORMAL LOW (ref 12.0–15.0)
MCH: 28.3 pg (ref 26.0–34.0)
MCHC: 31.6 g/dL (ref 30.0–36.0)
MCV: 89.6 fL (ref 80.0–100.0)
Platelets: 244 10*3/uL (ref 150–400)
RBC: 3.64 MIL/uL — ABNORMAL LOW (ref 3.87–5.11)
RDW: 14.6 % (ref 11.5–15.5)
WBC: 5.7 10*3/uL (ref 4.0–10.5)
nRBC: 0 % (ref 0.0–0.2)

## 2022-08-15 LAB — URINALYSIS, ROUTINE W REFLEX MICROSCOPIC
Bilirubin Urine: NEGATIVE
Glucose, UA: NEGATIVE mg/dL
Hgb urine dipstick: NEGATIVE
Ketones, ur: NEGATIVE mg/dL
Leukocytes,Ua: NEGATIVE
Nitrite: NEGATIVE
Protein, ur: NEGATIVE mg/dL
Specific Gravity, Urine: 1.011 (ref 1.005–1.030)
pH: 6 (ref 5.0–8.0)

## 2022-08-15 LAB — RAPID URINE DRUG SCREEN, HOSP PERFORMED
Amphetamines: POSITIVE — AB
Barbiturates: NOT DETECTED
Benzodiazepines: NOT DETECTED
Cocaine: NOT DETECTED
Opiates: NOT DETECTED
Tetrahydrocannabinol: POSITIVE — AB

## 2022-08-15 LAB — PROTIME-INR
INR: 0.9 (ref 0.8–1.2)
Prothrombin Time: 12.4 seconds (ref 11.4–15.2)

## 2022-08-15 LAB — APTT: aPTT: 26 seconds (ref 24–36)

## 2022-08-15 LAB — RESP PANEL BY RT-PCR (FLU A&B, COVID) ARPGX2
Influenza A by PCR: NEGATIVE
Influenza B by PCR: NEGATIVE
SARS Coronavirus 2 by RT PCR: NEGATIVE

## 2022-08-15 LAB — ETHANOL: Alcohol, Ethyl (B): <10 mg/dL (ref <10)

## 2022-08-15 NOTE — Consult Note (Signed)
NEUROLOGY CONSULTATION NOTE   Date of service: August 15, 2022 Patient Name: [name] MRN:  [id number] DOB:  May 05, 1946 Reason for consult: "stroke code for left sided symptoms" Requesting Provider: Regan Lemming, MD _ _ _   _ __   _ __ _ _  __ __   _ __   __ _  History of Present Illness  [name] is a 76 y.o. 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 Per EMS,Lkw of [id number] and family called EMS for confusion.  EMS concerned about left sided symptoms. She would squeeze well with R hand but not much with left. Patient reported headache and was stumbling when walking. She was not very talkative for EMS but came around in the ambulance and did not seem to have much deficit once she was more awake.  On arrival, no focal deficit. Patient is inappropriately laughing or gets upset, talkative with no focal deficit and able to follow commands. She denies any Etoh use, no recreational drug use. Patient reports that she is in remission from alcohol and has not had any in years.  She adds later on to the history that she took a puff of CBD and then had these symptoms.  Reports had R foot surgery in mid July.  LKW: 1830 mRS: 0 tNKASE: not offered since no obvious deficit. Thrombectomy: not offered since no obvious deficit. NIHSS components Score: Comment  1a Level of Conscious 0'[x]'$  1'[]'$  2'[]'$  3'[]'$      1b LOC Questions 0'[x]'$  1'[]'$  2'[]'$       1c LOC Commands 0'[x]'$  1'[]'$  2'[]'$       2 Best Gaze 0'[x]'$  1'[]'$  2'[]'$       3 Visual 0'[x]'$  1'[]'$  2'[]'$  3'[]'$      4 Facial Palsy 0'[x]'$  1'[]'$  2'[]'$  3'[]'$      5a Motor Arm - left 0'[x]'$  1'[]'$  2'[]'$  3'[]'$  4'[]'$  UN'[]'$    5b Motor Arm - Right 0'[x]'$  1'[]'$  2'[]'$  3'[]'$  4'[]'$  UN'[]'$    6a Motor Leg - Left 0'[x]'$  1'[]'$  2'[]'$  3'[]'$  4'[]'$  UN'[]'$    6b Motor Leg - Right 0'[x]'$  1'[]'$  2'[]'$  3'[]'$  4'[]'$  UN'[]'$    7 Limb Ataxia 0'[x]'$  1'[]'$  2'[]'$  3'[]'$  UN'[]'$     8 Sensory 0'[x]'$  1'[]'$  2'[]'$  UN'[]'$      9  Best Language 0'[x]'$  1'[]'$  2'[]'$  3'[]'$      10 Dysarthria 0'[]'$  1'[x]'$  2'[]'$  UN'[]'$      11 Extinct. and Inattention 0'[x]'$  1'[]'$  2'[]'$       TOTAL: 1     ROS   Constitutional Denies weight loss, fever and chills  HEENT Denies changes in vision and hearing.   Respiratory Denies SOB and cough.   CV Denies palpitations and CP   GI Denies abdominal pain, nausea, vomiting and diarrhea.   GU Denies dysuria and urinary frequency.   MSK Denies myalgia and joint pain.   Skin Denies rash and pruritus.   Neurological Denies headache and syncope.   Psychiatric Denies recent changes in mood. Denies anxiety and depression.    Past History   Past Medical History:  Diagnosis Date   Alcohol abuse    CAP (community acquired pneumonia) 05/05/2018   Carotid artery stenosis    Depression    Diabetes mellitus    Diverticulitis    Fibromyalgia    Hepatitis 08/28/2014   Hypertension    Myalgia and myositis 03/28/2012   Past Surgical  History:  Procedure Laterality Date   BUNIONECTOMY     CAROTID PTA/STENT INTERVENTION Left 08/07/2020   Procedure: CAROTID PTA/STENT INTERVENTION;  Surgeon: Algernon Huxley, MD;  Location: [facility];  Service: Cardiovascular;  Laterality: Left;   CESAREAN SECTION     COLONOSCOPY     COLONOSCOPY WITH ESOPHAGOGASTRODUODENOSCOPY (EGD)     COLONOSCOPY WITH PROPOFOL N/A 02/18/2021   Procedure: COLONOSCOPY WITH PROPOFOL;  Surgeon: Toledo, Benay Pike, MD;  Location: [facility];  Service: Gastroenterology;  Laterality: N/A;   ESOPHAGOGASTRODUODENOSCOPY (EGD) WITH PROPOFOL N/A 02/18/2021   Procedure: ESOPHAGOGASTRODUODENOSCOPY (EGD) WITH PROPOFOL;  Surgeon: Toledo, Benay Pike, MD;  Location: [facility];  Service: Gastroenterology;  Laterality: N/A;   TONSILLECTOMY     Family History  Problem Relation Age of Onset   Cancer Mother    Heart disease Father    Heart disease Sister    Social History   Socioeconomic History   Marital status: Married    Spouse name: Not on file   Number  of children: Not on file   Years of education: Not on file   Highest education level: Not on file  Occupational History   Not on file  Tobacco Use   Smoking status: Every Day    Types: Cigarettes, E-cigarettes    Last attempt to quit: 03/11/2010    Years since quitting: 12.4   Smokeless tobacco: Never  Vaping Use   Vaping Use: Former  Substance and Sexual Activity   Alcohol use: Not Currently   Drug use: No    Comment: Pt denies    Sexual activity: Never  Other Topics Concern   Not on file  Social History Narrative   Not on file   Social Determinants of Health   Financial Resource Strain: Not on file  Food Insecurity: Not on file  Transportation Needs: Not on file  Physical Activity: Not on file  Stress: Not on file  Social Connections: Not on file   Allergies  Allergen Reactions   Latex Hives   Lipitor [Atorvastatin Calcium] Other (See Comments)    Muscle aches   Codeine Rash    And nausea    Pentazocine Lactate Rash   Sulfonamide Derivatives Rash    Medications  (Not in a hospital admission)    Vitals   Vitals:   08/15/22 2016 08/15/22 2017  BP: (!) 112/49   Pulse: 81   Resp: (!) 7   Temp: 98.3 F (36.8 C)   TempSrc: Oral   SpO2: 96%   Weight:  74.6 kg  Height:  '5\' 3"'$  (1.6 m)     Body mass index is 29.13 kg/m.  Physical Exam   General: Laying comfortably in bed; in no acute distress.  HENT: Normal oropharynx and mucosa. Normal external appearance of ears and nose.  Neck: Supple, no pain or tenderness  CV: No JVD. No peripheral edema.  Pulmonary: Symmetric Chest rise. Normal respiratory effort.  Abdomen: Soft to touch, non-tender.  Ext: No cyanosis, edema, or deformity  Skin: No rash. Normal palpation of skin.   Musculoskeletal: Normal digits and nails by inspection. No clubbing.   Neurologic Examination  Mental status/Cognition: Alert, oriented to self, place, month and year, good attention.  Speech/language: Fluent, comprehension  intact, object naming intact, repetition intact.  Cranial nerves:   CN II Pupils equal and reactive to light, no VF deficits    CN III,IV,VI EOM intact, no gaze preference or deviation, no nystagmus    CN V normal  sensation in V1, V2, and V3 segments bilaterally    CN VII no asymmetry, no nasolabial fold flattening    CN VIII normal hearing to speech    CN IX & X normal palatal elevation, no uvular deviation    CN XI 5/5 head turn and 5/5 shoulder shrug bilaterally    CN XII midline tongue protrusion    Motor:  Muscle bulk: normal, tone normal, pronator drift none tremor none Mvmt Root Nerve  Muscle Right Left Comments  SA C5/6 Ax Deltoid     EF C5/6 Mc Biceps 5 5   EE C6/7/8 Rad Triceps 5 5   WF C6/7 Med FCR     WE C7/8 PIN ECU     F Ab C8/T1 U ADM/FDI 5 5   HF L1/2/3 Fem Illopsoas 5 5   KE L2/3/4 Fem Quad     DF L4/5 D Peron Tib Ant - 5   PF S1/2 Tibial Grc/Sol - 5    Reflexes:  Right Left Comments  Pectoralis      Biceps (C5/6) 2 2   Brachioradialis (C5/6) 2 2    Triceps (C6/7) 2 2    Patellar (L3/4) 2 2    Achilles (S1)      Hoffman      Plantar     Jaw jerk    Sensation:  Light touch Intact throughout   Pin prick    Temperature    Vibration   Proprioception    Coordination/Complex Motor:  - Finger to Nose intact BL - Heel to shin uable to do due to R foot surgery recently - Rapid alternating movement are normal - Gait: deferred.  Labs   CBC:  Recent Labs  Lab 08/15/22 1949 08/15/22 1958  WBC 5.7  --   NEUTROABS 2.3  --   HGB 10.3* 10.9*  HCT 32.6* 32.0*  MCV 89.6  --   PLT 244  --     Basic Metabolic Panel:  Lab Results  Component Value Date   NA 138 08/15/2022   K 4.2 08/15/2022   CO2 20 (L) 08/15/2022   GLUCOSE 121 (H) 08/15/2022   BUN 17 08/15/2022   CREATININE 1.10 (H) 08/15/2022   CALCIUM 8.7 (L) 08/15/2022   GFRNONAA 47 (L) 08/15/2022   GFRAA >60 08/08/2020   Lipid Panel:  Lab Results  Component Value Date   LDLCALC 135 (H)  04/18/2014   HgbA1c:  Lab Results  Component Value Date   HGBA1C 8.1 (H) 01/25/2016   Urine Drug Screen:     Component Value Date/Time   LABOPIA NONE DETECTED 01/24/2016 0435   COCAINSCRNUR NONE DETECTED 01/24/2016 0435   COCAINSCRNUR NEGATIVE 12/25/2010 1430   LABBENZ NONE DETECTED 01/24/2016 0435   LABBENZ (A) 12/25/2010 1430    POSITIVE (NOTE) Result repeated and verified. Sent for confirmatory testing   AMPHETMU NONE DETECTED 01/24/2016 [place] DETECTED 01/24/2016 0435   LABBARB NONE DETECTED 01/24/2016 0435    Alcohol Level     Component Value Date/Time   ETH <10 08/15/2022 1953    CT Head without contrast(Personally reviewed): CTH was negative for a large hypodensity concerning for a large territory infarct or hyperdensity concerning for an [place]  Impression   ZAEDA MCFERRAN is a 76 y.o. 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 Per  EMS,Lkw of 1830 and family called EMS for confusion.  No focal deficit on arrival. However, appears distraught, then laughing inappropriately a while later. No arm or leg weakness, no numbness, no aphasia, no vision deficit.  Reports that she took a puff of CBS and then had these symptoms.  Recommendations  - Low suspicion for stroke. Recommend starting with a UDS. If non revealing, can get an MRI Brain without contrast. - ETOH levels are negative. ______________________________________________________________________   Thank you for the opportunity to take part in the care of this patient. If you have any further questions, please contact the neurology consultation attending.  Signed,  [place] Pager Number [phone] _ _ _   _ __   _ __ _ _  __ __   _ __   __ _

## 2022-08-15 NOTE — Code Documentation (Signed)
Responded to Code Stroke called at [facility] for L sided weakness, facial droop, AMS, LSN-1830. Pt arrived at 1947, CBG-121, NIH-0, ,CT negative for acute changes. TNK not given>NIH-0. Plan UDS and possible MRI depending on results.

## 2022-08-15 NOTE — ED Triage Notes (Signed)
Patient BIB [facility] from home c/o stroke like symptoms.  EMS reports they were called out because the patient was "acting like she was drunk per husband."  Patient reportedly sober for a long time.  EMS reports left side facial droop and left side weakness upon their arrival and all symptoms resolved at 1938.  Patient has history of similar episodes.  Patient also endorses shob and was placed on 3L [place] by EMS.    110/72 82 HR

## 2022-08-15 NOTE — ED Provider Notes (Signed)
  Provider Note MRN:  [id number]  Arrival date & time: 08/15/22    ED Course and Medical Decision Making  Assumed care from Dr. Armandina Gemma at shift change.  Strokelike symptoms after vaping THC, if THC is negative will need MRI.  UDS positive for amphetamines but patient takes Vyvanse, positive for THC, otherwise normal.  MRI is obtained and is without evidence of acute stroke, patient is appropriate for discharge.  Procedures  Final Clinical Impressions(s) / ED Diagnoses     ICD-10-CM   1. Stroke-like symptoms  R29.90       ED Discharge Orders     None       Discharge Instructions   None     Barth Kirks. Sedonia Small, [place] [email or web address]    Maudie Flakes, MD 08/16/22 [phone]

## 2022-08-15 NOTE — ED Provider Notes (Signed)
[place] EMERGENCY DEPARTMENT Provider Note   CSN: [id number] Arrival date & time: 08/15/22  1947  An emergency department physician performed an initial assessment on this suspected stroke patient at 15.  History  Chief Complaint  Patient presents with   Code Stroke    [name] is a 76 y.o. female.  HPI   76 year old female with medical history significant for alcohol abuse, HTN, fibromyalgia, DM 2, depression, carotid artery stenosis, hepatitis who presents to the emergency department as a code stroke.  The patient states that she smoked a THC vape earlier today.  Subsequently she developed left-sided facial droop, left-sided weakness and had aphasia.  Symptoms have since resolved on my evaluation.  A code stroke was called based on symptoms with EMS and the patient's airway was cleared and neurology evaluated the patient bedside in the CT scanner for code stroke imaging.  Home Medications Prior to Admission medications   Medication Sig Start Date End Date Taking? Authorizing Provider  aspirin EC 81 MG EC tablet Take 1 tablet (81 mg total) by mouth daily. Swallow whole. 08/08/20   Stegmayer, Janalyn Harder, PA-C  BYSTOLIC 5 MG tablet TAKE 1 TABLET DAILY 09/26/15   Philemon Kingdom, MD  clopidogrel (PLAVIX) 75 MG tablet TAKE 1 TABLET BY MOUTH EVERY DAY 11/30/21   Algernon Huxley, MD  diphenoxylate-atropine (LOMOTIL) 2.5-0.025 MG tablet Take 1 tablet by mouth 4 (four) times daily as needed for diarrhea or loose stools.  07/08/10   [provider]  doxycycline (VIBRA-TABS) 100 MG tablet Take 1 tablet (100 mg total) by mouth 2 (two) times daily. 07/15/22   McDonald, Stephan Minister, DPM  Dulaglutide 0.75 MG/0.5ML SOPN Inject 3 mg into the skin once a week.     [provider]  EPINEPHrine 0.3 mg/0.3 mL IJ SOAJ injection Inject into the muscle.    [provider]  glipiZIDE (GLUCOTROL XL) 10 MG 24 hr tablet Take 1 tablet by mouth daily. Patient not  taking: Reported on 08/02/2022 02/08/22 02/08/23  [provider]  glucose blood (BAYER CONTOUR NEXT TEST) test strip Use to test blood sugar 3 times daily as instructed. Dx code: E11.65 02/10/15   Philemon Kingdom, MD  Lancets (ACCU-CHEK MULTICLIX) lancets Use to test blood sugar 3 times daily as instructed. Dx: E11.65 02/10/15   Philemon Kingdom, MD  lisdexamfetamine (VYVANSE) 50 MG capsule Take 50 mg by mouth daily.    [provider]  lisinopril (PRINIVIL,ZESTRIL) 5 MG tablet TAKE 1 TABLET BY MOUTH EVERY DAY 07/14/15   Philemon Kingdom, MD  LORazepam (ATIVAN) 0.5 MG tablet Take 1 tablet (0.5 mg total) by mouth daily as needed for anxiety. 09/16/14   [name], MD  metFORMIN (GLUCOPHAGE) 1000 MG tablet SMARTSIG:1 By Mouth 10/19/21   [provider]  mupirocin ointment (BACTROBAN) 2 % Apply 1 Application topically 2 (two) times daily. 07/15/22   Criselda Peaches, DPM  omeprazole (PRILOSEC) 40 MG capsule TK 1 C PO D 08/30/18   [provider]  pregabalin (LYRICA) 300 MG capsule Take 300 mg by mouth 2 (two) times daily. 08/05/20   [provider]  rosuvastatin (CRESTOR) 5 MG tablet Take by mouth. 07/26/18   [provider]  sitaGLIPtin (JANUVIA) 100 MG tablet TAKE 1 TABLET BY MOUTH EVERY DAY. 10/02/18   [provider]  traMADol (ULTRAM) 50 MG tablet TAKE 1 TABLET(50 MG) BY MOUTH EVERY 6 HOURS FOR UP TO 5 DAYS AS NEEDED 05/04/22   McDonald,  Stephan Minister, DPM      Allergies    Latex, Lipitor [atorvastatin calcium], Codeine, Pentazocine lactate, and Sulfonamide derivatives    Review of Systems   Review of Systems  Unable to perform ROS: Acuity of condition    Physical Exam Updated Vital Signs BP 119/66   Pulse 79   Temp 98.3 F (36.8 C) (Oral)   Resp 12   Ht '5\' 3"'$  (1.6 m)   Wt 74.6 kg   SpO2 99%   BMI 29.13 kg/m  Physical Exam Vitals and nursing note reviewed.  Constitutional:      General: She is not in acute distress.     Appearance: She is well-developed.  HENT:     Head: Normocephalic and atraumatic.  Eyes:     Conjunctiva/sclera: Conjunctivae normal.  Cardiovascular:     Rate and Rhythm: Normal rate and regular rhythm.  Pulmonary:     Effort: Pulmonary effort is normal. No respiratory distress.     Breath sounds: Normal breath sounds.  Abdominal:     Palpations: Abdomen is soft.     Tenderness: There is no abdominal tenderness.  Musculoskeletal:        General: No swelling.     Cervical back: Neck supple.  Skin:    General: Skin is warm and dry.     Capillary Refill: Capillary refill takes less than 2 seconds.  Neurological:     Mental Status: She is alert.     Comments: MENTAL STATUS EXAM:    Orientation: Alert and oriented to person, place and time.  Memory: Cooperative, follows commands well.  Language: Speech is clear and language is normal.   CRANIAL NERVES:    CN 2 (Optic): Visual fields intact to confrontation.  CN 3,4,6 (EOM): Pupils equal and reactive to light. Full extraocular eye movement without nystagmus.  CN 5 (Trigeminal): Facial sensation is normal, no weakness of masticatory muscles.  CN 7 (Facial): No facial weakness or asymmetry.  CN 8 (Auditory): Auditory acuity grossly normal.  CN 9,10 (Glossophar): The uvula is midline, the palate elevates symmetrically.  CN 11 (spinal access): Normal sternocleidomastoid and trapezius strength.  CN 12 (Hypoglossal): The tongue is midline. No atrophy or fasciculations.Marland Kitchen   MOTOR:  Muscle Strength: 5/5RUE, 5/5LUE, 5/5RLE, 5/5LLE.   COORDINATION:   Intact finger-to-nose, no tremor.   SENSATION:   Intact to light touch all four extremities.     Psychiatric:        Mood and Affect: Mood normal.     ED Results / Procedures / Treatments   Labs (all labs ordered are listed, but only abnormal results are displayed) Labs Reviewed  CBC - Abnormal; Notable for the following components:      Result Value   RBC 3.64 (*)    Hemoglobin  10.3 (*)    HCT 32.6 (*)    All other components within normal limits  COMPREHENSIVE METABOLIC PANEL - Abnormal; Notable for the following components:   CO2 20 (*)    Glucose, Bld 123 (*)    Creatinine, Ser 1.21 (*)    Calcium 8.7 (*)    Total Protein 5.9 (*)    Albumin 3.3 (*)    GFR, Estimated 47 (*)    All other components within normal limits  I-STAT CHEM 8, ED - Abnormal; Notable for the following components:   Creatinine, Ser 1.10 (*)    Glucose, Bld 121 (*)    Calcium, Ion 1.03 (*)    TCO2 20 (*)  Hemoglobin 10.9 (*)    HCT 32.0 (*)    All other components within normal limits  RESP PANEL BY RT-PCR (FLU A&B, COVID) ARPGX2  ETHANOL  PROTIME-INR  APTT  DIFFERENTIAL  RAPID URINE DRUG SCREEN, HOSP PERFORMED  URINALYSIS, ROUTINE W REFLEX MICROSCOPIC    EKG EKG Interpretation  Date/Time:  Sunday August 15 2022 20:57:47 EDT Ventricular Rate:  76 PR Interval:  163 QRS Duration: 134 QT Interval:  426 QTC Calculation: 479 R Axis:   -15 Text Interpretation: Sinus rhythm Left bundle branch block Confirmed by Regan Lemming ([id number]) on 08/15/2022 10:47:53 PM  Radiology CT HEAD CODE STROKE WO CONTRAST  Result Date: 08/15/2022 CLINICAL DATA:  Code stroke. EXAM: CT HEAD WITHOUT CONTRAST TECHNIQUE: Contiguous axial images were obtained from the base of the skull through the vertex without intravenous contrast. RADIATION DOSE REDUCTION: This exam was performed according to the departmental dose-optimization program which includes automated exposure control, adjustment of the mA and/or kV according to patient size and/or use of iterative reconstruction technique. COMPARISON:  Prior study from 11/10/2011. FINDINGS: Brain: Cerebral volume within normal limits. Mild chronic microvascular ischemic disease noted involving the supratentorial cerebral white matter. No acute intracranial hemorrhage. No acute large vessel territory infarct. No mass lesion or midline shift. No hydrocephalus or  extra-axial fluid collection. Vascular: No hyperdense vessel. Scattered vascular calcifications noted within the carotid siphons. Skull: Scalp soft tissues and calvarium within normal limits. Sinuses/Orbits: Globes orbital soft tissues within normal limits. Chronic right-sided paranasal sinusitis noted. Mastoid air cells are clear. Other: None. ASPECTS [facility] Stroke Program Early CT Score) - Ganglionic level infarction (caudate, lentiform nuclei, internal capsule, insula, M1-M3 cortex): 7 - Supraganglionic infarction (M4-M6 cortex): 3 Total score (0-10 with 10 being normal): 10 IMPRESSION: 1. No acute intracranial abnormality. 2. ASPECTS is 10. 3. Mild chronic microvascular ischemic disease. These results were communicated to Dr. Lorrin Goodell at 8:11 pm on 08/15/2022 by text page via the [facility] messaging system. Electronically Signed   By: Jeannine Boga M.D.   On: 08/15/2022 20:12    Procedures Procedures    Medications Ordered in ED Medications - No data to display  ED Course/ Medical Decision Making/ A&P                           Medical Decision Making Amount and/or Complexity of Data Reviewed Labs: ordered. Radiology: ordered.    76 year old female with medical history significant for alcohol abuse, HTN, fibromyalgia, DM 2, depression, carotid artery stenosis, hepatitis who presents to the emergency department as a code stroke.  The patient states that she smoked a THC vape earlier today.  Subsequently she developed left-sided facial droop, left-sided weakness and had aphasia.  Symptoms have since resolved on my evaluation.  A code stroke was called based on symptoms with EMS and the patient's airway was cleared and neurology evaluated the patient bedside in the CT scanner for code stroke imaging.  On arrival, the patient was vitally stable, afebrile, not tachycardic or tachypneic, BP 626 systolic, low diastolic BP 948/54.  Patient was initially placed on 3 L/min O2 with EMS due to  complaint of shortness of breath.  EKG was performed revealed sinus rhythm, left bundle branch block present, trigger rate 76, no STEMI.  The a code stroke imaging revealed the following: IMPRESSION:  1. No acute intracranial abnormality.  2. ASPECTS is 10.  3. Mild chronic microvascular ischemic disease.    These results were communicated to  Dr. Lorrin Goodell at 8:11 pm on  08/15/2022 by text page via the [facility] messaging system.    Laboratory evaluation significant for CBC without leukocytosis, stable anemia to 10.3, CMP with mild non-anion gap acidosis with a bicarb of 20, creatinine mildly elevated 1.21, alcohol level normal, COVID-19 PCR testing pending, urinalysis and UDS collected and pending.  Neurology recommended obtaining a UDS, lower suspicion for acute CVA although if UDS negative beyond positive for THC, recommendations did include obtaining an MRI for further evaluation.  Laboratory workup as above and imaging pending at time of of signout. Signout given to Dr. Sedonia Small at 2300.   Final Clinical Impression(s) / ED Diagnoses Final diagnoses:  Stroke-like symptoms    Rx / DC Orders ED Discharge Orders     None         Regan Lemming, MD 08/15/22 2254

## 2022-08-16 ENCOUNTER — Emergency Department (HOSPITAL_COMMUNITY): Payer: HMO

## 2022-08-16 DIAGNOSIS — J3489 Other specified disorders of nose and nasal sinuses: Secondary | ICD-10-CM | POA: Diagnosis not present

## 2022-08-16 DIAGNOSIS — R531 Weakness: Secondary | ICD-10-CM | POA: Diagnosis not present

## 2022-08-16 DIAGNOSIS — R410 Disorientation, unspecified: Secondary | ICD-10-CM | POA: Diagnosis not present

## 2022-08-16 NOTE — ED Notes (Signed)
Patient back from MRI.

## 2022-08-16 NOTE — ED Notes (Signed)
Patient to MRI.

## 2022-08-16 NOTE — Discharge Instructions (Signed)
You were evaluated in the Emergency Department and after careful evaluation, we did not find any emergent condition requiring admission or further testing in the hospital.  Your exam/testing today is overall reassuring.  MRI did not show any signs of stroke.  Please return to the Emergency Department if you experience any worsening of your condition.   Thank you for allowing [place] to be a part of your care.

## 2022-08-18 ENCOUNTER — Ambulatory Visit (INDEPENDENT_AMBULATORY_CARE_PROVIDER_SITE_OTHER): Payer: HMO | Admitting: Podiatry

## 2022-08-18 ENCOUNTER — Ambulatory Visit (INDEPENDENT_AMBULATORY_CARE_PROVIDER_SITE_OTHER): Payer: HMO

## 2022-08-18 ENCOUNTER — Other Ambulatory Visit: Payer: Self-pay | Admitting: Podiatry

## 2022-08-18 DIAGNOSIS — M2011 Hallux valgus (acquired), right foot: Secondary | ICD-10-CM | POA: Diagnosis not present

## 2022-08-18 DIAGNOSIS — M2041 Other hammer toe(s) (acquired), right foot: Secondary | ICD-10-CM

## 2022-08-18 NOTE — Progress Notes (Signed)
  Subjective:  Patient ID: [name], female    DOB: [birth year],  MRN: [id number]  Chief Complaint  Patient presents with   Bunions    Right foot post op with new xrays, DOS 06/25/22      77 y.o. female returns for post-op check.  Still has some scab left.  Has noticed swelling as well   Review of Systems: Negative except as noted in the HPI. Denies N/V/F/Ch.   Objective:  There were no vitals filed for this visit. There is no height or weight on file to calculate BMI. Constitutional Well developed. Well nourished.  Vascular Foot warm and well perfused. Capillary refill normal to all digits.  Calf is soft and supple, no posterior calf or knee pain, negative Homans' sign  Neurologic Normal speech. Oriented to person, place, and time. Epicritic sensation to light touch grossly present bilaterally.  Dermatologic Incisions are well-healed, small areas persistent scab on the distal hallux, much improved  Orthopedic: Minimal tenderness and edema today, mostly centered around the forefoot   Multiple view plain film radiographs: Some loss of correction in the intermetatarsal angle, there is pull-through of the intercuneiform screw, incomplete fusion noted in the plantar portions of the first TMT J Assessment:   1. Acquired hallux valgus of right foot   2. Hammer toe of right foot     Plan:  Patient was evaluated and treated and all questions answered.  S/p foot surgery right -We reviewed her x-rays.  She is still having some delayed healing at the fusion site.  I recommended she continue to restrict her activity and weight-bear as tolerated only in the surgical boot.  I would like to see her back in 4 weeks for new radiographs.  We discussed her delayed bone healing is secondary to her diabetes and vitamin D insufficiency which has been supplemented at this point.  If not significant increase in healing by the next visit then I would recommend noninvasive bone  stimulation.   Return in about 4 weeks (around 09/15/2022) for post op (new x-rays).

## 2022-09-07 NOTE — Progress Notes (Signed)
Charlene Alkema M. [name], MD [facility] [email or web address]  

## 2022-09-15 ENCOUNTER — Ambulatory Visit (INDEPENDENT_AMBULATORY_CARE_PROVIDER_SITE_OTHER): Payer: HMO | Admitting: Podiatry

## 2022-09-15 ENCOUNTER — Ambulatory Visit (INDEPENDENT_AMBULATORY_CARE_PROVIDER_SITE_OTHER): Payer: HMO

## 2022-09-15 DIAGNOSIS — M2011 Hallux valgus (acquired), right foot: Secondary | ICD-10-CM

## 2022-09-19 NOTE — Progress Notes (Signed)
  Subjective:  Patient ID: [name], female    DOB: [birth year],  MRN: [id number]  Chief Complaint  Patient presents with   Routine Post Op    DOS 06/25/22, bunion correction right      76 y.o. female returns for post-op check.      Review of Systems: Negative except as noted in the HPI. Denies N/V/F/Ch.   Objective:  There were no vitals filed for this visit. There is no height or weight on file to calculate BMI. Constitutional Well developed. Well nourished.  Vascular Foot warm and well perfused. Capillary refill normal to all digits.  Calf is soft and supple, no posterior calf or knee pain, negative Homans' sign  Neurologic Normal speech. Oriented to person, place, and time. Epicritic sensation to light touch grossly present bilaterally.  Dermatologic Incisions are well-healed   Orthopedic: Minimal tenderness and edema today, mostly centered around the forefoot   Multiple view plain film radiographs: Continued loss of correction and nonunion of the tarsometatarsal joint Assessment:   1. Acquired hallux valgus of right foot     Plan:  Patient was evaluated and treated and all questions answered.  S/p foot surgery right -We reviewed her x-rays.  She is still having some delayed healing at the fusion site.  She may transition to a postoperative shoe.  Continue vitamin D supplementation.  Return in 3 weeks for new x-rays expect we will need noninvasive consideration.  We discussed the risk of this not healing in the next 3 months and need for surgical revision   Return in about 3 weeks (around 10/06/2022) for post op (new x-rays).

## 2022-10-06 ENCOUNTER — Encounter: Payer: HMO | Admitting: Podiatry

## 2022-10-08 ENCOUNTER — Other Ambulatory Visit (INDEPENDENT_AMBULATORY_CARE_PROVIDER_SITE_OTHER): Payer: Self-pay | Admitting: Vascular Surgery

## 2022-10-11 ENCOUNTER — Ambulatory Visit (INDEPENDENT_AMBULATORY_CARE_PROVIDER_SITE_OTHER): Payer: HMO

## 2022-10-11 ENCOUNTER — Encounter (INDEPENDENT_AMBULATORY_CARE_PROVIDER_SITE_OTHER): Payer: Self-pay

## 2022-10-11 ENCOUNTER — Ambulatory Visit (INDEPENDENT_AMBULATORY_CARE_PROVIDER_SITE_OTHER): Payer: HMO | Admitting: Podiatry

## 2022-10-11 DIAGNOSIS — M2041 Other hammer toe(s) (acquired), right foot: Secondary | ICD-10-CM

## 2022-10-11 DIAGNOSIS — E559 Vitamin D deficiency, unspecified: Secondary | ICD-10-CM | POA: Diagnosis not present

## 2022-10-11 DIAGNOSIS — M2011 Hallux valgus (acquired), right foot: Secondary | ICD-10-CM | POA: Diagnosis not present

## 2022-10-11 DIAGNOSIS — M960 Pseudarthrosis after fusion or arthrodesis: Secondary | ICD-10-CM

## 2022-10-12 NOTE — Progress Notes (Signed)
  Subjective:  Patient ID: [name], female    DOB: [birth year],  MRN: [id number]  Chief Complaint  Patient presents with   Bunions    POV DOS 06/25/2022 LAPIDUS BUNIONECTOMY, Barbie Banner OSTEOTOMY, AND HAMMER TOE REPAIR 2ND RT/  X-RAYS      76 y.o. female returns for post-op check.   She has been wearing a supportive shoe with a carbon fiber insert, she notes it is not particularly painful and swelling is continuing to improve she still wearing a compression sleeve for this   Review of Systems: Negative except as noted in the HPI. Denies N/V/F/Ch.   Objective:  There were no vitals filed for this visit. There is no height or weight on file to calculate BMI. Constitutional Well developed. Well nourished.  Vascular Foot warm and well perfused. Capillary refill normal to all digits.  Calf is soft and supple, no posterior calf or knee pain, negative Homans' sign  Neurologic Normal speech. Oriented to person, place, and time. Epicritic sensation to light touch grossly present bilaterally.  Dermatologic Incisions are well-healed   Orthopedic: Little to no edema around the first tarsometatarsal joint   Multiple view plain film radiographs: No change in correction or alignment, there is continued lucency at the first tarsometatarsal joint there is some bridging dorsal and lateral.  Lateral deviation of the second and third MTPJ's Assessment:   1. Acquired hallux valgus of right foot   2. Hammer toe of right foot   3. Pseudarthrosis after fusion or arthrodesis   4. Vitamin D deficiency     Plan:  Patient was evaluated and treated and all questions answered.  S/p foot surgery right -We again reviewed her x-rays.  I do think she should benefit from noninvasive bone stimulation and a prescription for this will be sent to [facility].  We discussed the use of this.  I did order her a vitamin D level to be checked again next week we will consider further supplementation pending this.  I will  see her back in 6 weeks for new x-rays continued weightbearing as tolerated in supportive shoe with carbon fiber insert.  Advised not to go barefoot at this time.   Return in about 6 weeks (around 11/22/2022) for surgery follow up (right foot xrays).

## 2022-11-24 ENCOUNTER — Encounter: Payer: HMO | Admitting: Podiatry

## 2022-12-01 ENCOUNTER — Ambulatory Visit (INDEPENDENT_AMBULATORY_CARE_PROVIDER_SITE_OTHER): Payer: HMO

## 2022-12-01 ENCOUNTER — Ambulatory Visit (INDEPENDENT_AMBULATORY_CARE_PROVIDER_SITE_OTHER): Payer: HMO | Admitting: Podiatry

## 2022-12-01 DIAGNOSIS — M960 Pseudarthrosis after fusion or arthrodesis: Secondary | ICD-10-CM | POA: Diagnosis not present

## 2022-12-01 DIAGNOSIS — M2011 Hallux valgus (acquired), right foot: Secondary | ICD-10-CM | POA: Diagnosis not present

## 2022-12-01 NOTE — Progress Notes (Signed)
  Subjective:  Patient ID: [name], female    DOB: [birth year],  MRN: [id number]  Chief Complaint  Patient presents with   Bunions    Follow up right foot      76 y.o. female returns for post-op check.   She has been wearing a supportive shoe with a carbon fiber insert, she received a bone stimulator for the had to go out of town for a family funeral, has not used it yet she was not sure where to use it.  Has been on her foot quite a bit with travel has been trying to walk towards the outside keep weight off of it   Review of Systems: Negative except as noted in the HPI. Denies N/V/F/Ch.   Objective:  There were no vitals filed for this visit. There is no height or weight on file to calculate BMI. Constitutional Well developed. Well nourished.  Vascular Foot warm and well perfused. Capillary refill normal to all digits.  Calf is soft and supple, no posterior calf or knee pain, negative Homans' sign  Neurologic Normal speech. Oriented to person, place, and time. Epicritic sensation to light touch grossly present bilaterally.  Dermatologic Incisions are well-healed   Orthopedic: Little to no edema around the first tarsometatarsal joint   Multiple view plain film radiographs: No change in correction or alignment, there is continued lucency at the first tarsometatarsal joint there is some bridging dorsal and lateral.  Lateral deviation of the second and third MTPJ's Assessment:   1. Acquired hallux valgus of right foot   2. Pseudarthrosis after fusion or arthrodesis     Plan:  Patient was evaluated and treated and all questions answered.  S/p foot surgery right -We again reviewed her x-rays.  I reviewed with her where to place the bone stimulator.  Advised her to use this daily.  She had her vitamin D level checked on 11/12/2022 and it was 37.4.  She will continue a daily OTC supplementation for this.  Advised her to continue to rest as much as she can, return in 8 weeks for  new x-rays after thorough bone stimulation   Return in about 8 weeks (around 01/26/2023) for surgery follow up new xrays.

## 2023-01-27 ENCOUNTER — Ambulatory Visit: Payer: HMO | Admitting: Podiatry

## 2023-01-31 ENCOUNTER — Other Ambulatory Visit: Payer: Self-pay | Admitting: Podiatry

## 2023-02-16 ENCOUNTER — Ambulatory Visit: Payer: Medicare HMO | Admitting: Podiatry

## 2023-02-16 ENCOUNTER — Ambulatory Visit (INDEPENDENT_AMBULATORY_CARE_PROVIDER_SITE_OTHER): Payer: Medicare HMO

## 2023-02-16 ENCOUNTER — Encounter: Payer: Self-pay | Admitting: Podiatry

## 2023-02-16 DIAGNOSIS — M2011 Hallux valgus (acquired), right foot: Secondary | ICD-10-CM

## 2023-02-16 DIAGNOSIS — M960 Pseudarthrosis after fusion or arthrodesis: Secondary | ICD-10-CM

## 2023-02-16 NOTE — Progress Notes (Signed)
  Subjective:  Patient ID: [name], female    DOB: 09-11-1946,  MRN: [id number]  Chief Complaint  Patient presents with   Bunions    surgery follow up DOS 06/25/22  new xrays      77 y.o. female returns for post-op check.   She returns for follow-up she has been using the bone stimulator since last visit.  The stimulator indicates she has used the stimulator for 42 out of 67 treatment days.  She says it feels better   Review of Systems: Negative except as noted in the HPI. Denies N/V/F/Ch.   Objective:  There were no vitals filed for this visit. There is no height or weight on file to calculate BMI. Constitutional Well developed. Well nourished.  Vascular Foot warm and well perfused. Capillary refill normal to all digits.  Calf is soft and supple, no posterior calf or knee pain, negative Homans' sign  Neurologic Normal speech. Oriented to person, place, and time. Epicritic sensation to light touch grossly present bilaterally.  Dermatologic Incisions are well-healed   Orthopedic: Little to no edema around the first tarsometatarsal joint   Multiple view plain film radiographs: Significant improvement in the fusion across the joint noted on all views, no change in alignment or hardware Assessment:   1. Pseudarthrosis after fusion or arthrodesis   2. Acquired hallux valgus of right foot     Plan:  Patient was evaluated and treated and all questions answered.  S/p foot surgery right -Starting to see improvement.  I recommend she continue noninvasive bone marrow stimulation.  I like to see her back in 3 months for follow-up.  May continue regular shoe gear and activity as tolerated.   Return in about 3 months (around 05/19/2023) for surgery follow up on right foot new xrays.

## 2023-05-19 ENCOUNTER — Ambulatory Visit (INDEPENDENT_AMBULATORY_CARE_PROVIDER_SITE_OTHER): Payer: Medicare HMO | Admitting: Podiatry

## 2023-05-19 DIAGNOSIS — M960 Pseudarthrosis after fusion or arthrodesis: Secondary | ICD-10-CM

## 2023-05-20 NOTE — Progress Notes (Signed)
Patient was no-show for appointment today 

## 2023-06-27 ENCOUNTER — Other Ambulatory Visit (HOSPITAL_BASED_OUTPATIENT_CLINIC_OR_DEPARTMENT_OTHER): Payer: Self-pay

## 2023-06-27 ENCOUNTER — Other Ambulatory Visit (HOSPITAL_COMMUNITY): Payer: Self-pay

## 2023-06-27 MED ORDER — TRULICITY 3 MG/0.5ML ~~LOC~~ SOAJ
3.0000 mg | SUBCUTANEOUS | 1 refills | Status: AC
  Filled 2023-06-27: qty 2, 28d supply, fill #0
  Filled 2023-07-19: qty 2, 28d supply, fill #1
  Filled 2023-09-03: qty 2, 28d supply, fill #2
  Filled 2023-10-15: qty 2, 28d supply, fill #3
  Filled 2023-11-13: qty 2, 28d supply, fill #4

## 2023-07-15 ENCOUNTER — Other Ambulatory Visit (HOSPITAL_BASED_OUTPATIENT_CLINIC_OR_DEPARTMENT_OTHER): Payer: Self-pay

## 2023-07-18 ENCOUNTER — Other Ambulatory Visit (INDEPENDENT_AMBULATORY_CARE_PROVIDER_SITE_OTHER): Payer: Self-pay | Admitting: Vascular Surgery

## 2023-07-25 ENCOUNTER — Other Ambulatory Visit (INDEPENDENT_AMBULATORY_CARE_PROVIDER_SITE_OTHER): Payer: Self-pay | Admitting: Vascular Surgery

## 2023-07-25 DIAGNOSIS — I6523 Occlusion and stenosis of bilateral carotid arteries: Secondary | ICD-10-CM

## 2023-07-26 ENCOUNTER — Ambulatory Visit (INDEPENDENT_AMBULATORY_CARE_PROVIDER_SITE_OTHER): Payer: Medicare HMO | Admitting: Vascular Surgery

## 2023-07-26 ENCOUNTER — Encounter (INDEPENDENT_AMBULATORY_CARE_PROVIDER_SITE_OTHER): Payer: Self-pay | Admitting: Vascular Surgery

## 2023-07-26 ENCOUNTER — Ambulatory Visit (INDEPENDENT_AMBULATORY_CARE_PROVIDER_SITE_OTHER): Payer: Medicare HMO

## 2023-07-26 VITALS — BP 108/67 | HR 67 | Resp 16 | Wt 150.2 lb

## 2023-07-26 DIAGNOSIS — I6523 Occlusion and stenosis of bilateral carotid arteries: Secondary | ICD-10-CM

## 2023-07-26 DIAGNOSIS — E785 Hyperlipidemia, unspecified: Secondary | ICD-10-CM | POA: Diagnosis not present

## 2023-07-26 DIAGNOSIS — E1139 Type 2 diabetes mellitus with other diabetic ophthalmic complication: Secondary | ICD-10-CM | POA: Diagnosis not present

## 2023-07-26 DIAGNOSIS — I10 Essential (primary) hypertension: Secondary | ICD-10-CM | POA: Diagnosis not present

## 2023-07-26 NOTE — Progress Notes (Signed)
MRN : [id number]  [name] is a 77 y.o. (Mar 12, 1946) female who presents with chief complaint of  Chief Complaint  Patient presents with   Follow-up    Ultrasound follow up  .  History of Present Illness: Patient returns in follow-up of her carotid disease.  She is about 3 years status post left carotid stent placement for high-grade stenosis.  She is doing well.  She has improved her medical condition significantly, lost some weight, and is overall doing quite well.  She denies any recent focal neurologic symptoms. Specifically, the patient denies amaurosis fugax, speech or swallowing difficulties, or arm or leg weakness or numbness.  Carotid duplex today shows stenosis just into the 40 to 59% range on the right and a widely patent left carotid stent without significant recurrent stenosis.  Current Outpatient Medications  Medication Sig Dispense Refill   amphetamine-dextroamphetamine (ADDERALL XR) 20 MG 24 hr capsule Take 40 mg by mouth every morning.     aspirin EC 81 MG EC tablet Take 1 tablet (81 mg total) by mouth daily. Swallow whole. 90 tablet 3   BYSTOLIC 5 MG tablet TAKE 1 TABLET DAILY 90 tablet 0   clopidogrel (PLAVIX) 75 MG tablet TAKE 1 TABLET BY MOUTH EVERY DAY 90 tablet 2   diphenoxylate-atropine (LOMOTIL) 2.5-0.025 MG tablet Take 1 tablet by mouth 4 (four) times daily as needed for diarrhea or loose stools.      Dulaglutide (TRULICITY) 3 MG/0.5ML SOPN Inject 3 mg into the skin once a week. 6 mL 1   Dulaglutide 0.75 MG/0.5ML SOPN Inject 3 mg into the skin once a week.      empagliflozin (JARDIANCE) 25 MG TABS tablet Take 25 mg by mouth daily.     EPINEPHrine 0.3 mg/0.3 mL IJ SOAJ injection Inject into the muscle.     glucose blood (BAYER CONTOUR NEXT TEST) test strip Use to test blood sugar 3 times daily as instructed. Dx code: E11.65 100 each 11   Lancets (ACCU-CHEK MULTICLIX) lancets Use to test blood sugar 3 times daily as instructed. Dx: E11.65 100 each 11    lisinopril (PRINIVIL,ZESTRIL) 5 MG tablet TAKE 1 TABLET BY MOUTH EVERY DAY 90 tablet 0   LORazepam (ATIVAN) 0.5 MG tablet Take 1 tablet (0.5 mg total) by mouth daily as needed for anxiety. 20 tablet 0   metFORMIN (GLUCOPHAGE) 1000 MG tablet SMARTSIG:1 By Mouth     mupirocin ointment (BACTROBAN) 2 % Apply 1 Application topically 2 (two) times daily. 30 g 2   omeprazole (PRILOSEC) 40 MG capsule TK 1 C PO D  2   pregabalin (LYRICA) 300 MG capsule Take 300 mg by mouth 2 (two) times daily.     tretinoin (RETIN-A) 0.1 % cream SMARTSIG:sparingly Topical Every Night     doxycycline (VIBRA-TABS) 100 MG tablet Take 1 tablet (100 mg total) by mouth 2 (two) times daily. (Patient not taking: Reported on 07/26/2023) 28 tablet 0   lisdexamfetamine (VYVANSE) 50 MG capsule Take 50 mg by mouth daily.     rosuvastatin (CRESTOR) 5 MG tablet Take by mouth.     sitaGLIPtin (JANUVIA) 100 MG tablet TAKE 1 TABLET BY MOUTH EVERY DAY. (Patient not taking: Reported on 07/26/2023)     traMADol (ULTRAM) 50 MG tablet TAKE 1 TABLET(50 MG) BY MOUTH EVERY 6 HOURS FOR UP TO 5 DAYS AS NEEDED 20 tablet 0   Vitamin D, Ergocalciferol, (DRISDOL) 1.25 MG (50000 UNIT) CAPS capsule TAKE 1 CAPSULE BY MOUTH EVERY 7  DAYS FOR 8 DOSES (Patient not taking: Reported on 07/26/2023) 8 capsule 0   No current facility-administered medications for this visit.    Past Medical History:  Diagnosis Date   Alcohol abuse    CAP (community acquired pneumonia) 05/05/2018   Carotid artery stenosis    Depression    Diabetes mellitus    Diverticulitis    Fibromyalgia    Hepatitis 08/28/2014   Hypertension    Myalgia and myositis 03/28/2012    Past Surgical History:  Procedure Laterality Date   BUNIONECTOMY     CAROTID PTA/STENT INTERVENTION Left 08/07/2020   Procedure: CAROTID PTA/STENT INTERVENTION;  Surgeon: Annice Needy, MD;  Location: [facility];  Service: Cardiovascular;  Laterality: Left;   CESAREAN SECTION     COLONOSCOPY      COLONOSCOPY WITH ESOPHAGOGASTRODUODENOSCOPY (EGD)     COLONOSCOPY WITH PROPOFOL N/A 02/18/2021   Procedure: COLONOSCOPY WITH PROPOFOL;  Surgeon: Toledo, Boykin Nearing, MD;  Location: [facility];  Service: Gastroenterology;  Laterality: N/A;   ESOPHAGOGASTRODUODENOSCOPY (EGD) WITH PROPOFOL N/A 02/18/2021   Procedure: ESOPHAGOGASTRODUODENOSCOPY (EGD) WITH PROPOFOL;  Surgeon: Toledo, Boykin Nearing, MD;  Location: [facility];  Service: Gastroenterology;  Laterality: N/A;   TONSILLECTOMY       Social History   Tobacco Use   Smoking status: Every Day    Current packs/day: 0.00    Types: Cigarettes, E-cigarettes    Last attempt to quit: 03/11/2010    Years since quitting: 13.3   Smokeless tobacco: Never  Vaping Use   Vaping status: Former  Substance Use Topics   Alcohol use: Not Currently   Drug use: No    Comment: Pt denies       Family History  Problem Relation Age of Onset   Cancer Mother    Heart disease Father    Heart disease Sister      Allergies  Allergen Reactions   Latex Hives   Lipitor [Atorvastatin Calcium] Other (See Comments)    Muscle aches   Codeine Rash    And nausea    Pentazocine Lactate Rash   Sulfonamide Derivatives Rash    REVIEW OF SYSTEMS (Negative unless checked)   Constitutional: [] Weight loss  [] Fever  [] Chills Cardiac: [] Chest pain   [x] Chest pressure   [] Palpitations   [] Shortness of breath when laying flat   [] Shortness of breath at rest   [x] Shortness of breath with exertion. Vascular:  [] Pain in legs with walking   [] Pain in legs at rest   [] Pain in legs when laying flat   [] Claudication   [] Pain in feet when walking  [] Pain in feet at rest  [] Pain in feet when laying flat   [] History of DVT   [] Phlebitis   [] Swelling in legs   [] Varicose veins   [] Non-healing ulcers Pulmonary:   [] Uses home oxygen   [] Productive cough   [] Hemoptysis   [] Wheeze  [] COPD   [] Asthma Neurologic:  [] Dizziness  [] Blackouts   [] Seizures   [] History of stroke    [x] History of TIA  [] Aphasia   [] Temporary blindness   [] Dysphagia   [] Weakness or numbness in arms   [] Weakness or numbness in legs Musculoskeletal:  [] Arthritis   [] Joint swelling   [] Joint pain   [] Low back pain Hematologic:  [] Easy bruising  [] Easy bleeding   [] Hypercoagulable state   [] Anemic  [] Hepatitis Gastrointestinal:  [] Blood in stool   [] Vomiting blood  [] Gastroesophageal reflux/heartburn   [] Abdominal pain Genitourinary:  [] Chronic kidney disease   [] Difficult urination  []   Frequent urination  [] Burning with urination   [] Hematuria Skin:  [] Rashes   [] Ulcers   [] Wounds Psychological:  [x] History of anxiety   [x]  History of major depression.  Physical Examination  Vitals:   07/26/23 1347  BP: 108/67  Pulse: 67  Resp: 16  Weight: 150 lb 3.2 oz (68.1 kg)   Body mass index is 26.61 kg/m. Gen:  WD/WN, NAD Head: [place]/AT, No temporalis wasting. Ear/Nose/Throat: Hearing grossly intact, nares w/o erythema or drainage, trachea midline Eyes: Conjunctiva clear. Sclera non-icteric Neck: Supple.  No bruit  Pulmonary:  Good air movement, equal and clear to auscultation bilaterally.  Cardiac: RRR, No JVD Vascular:  Vessel Right Left  Radial Palpable Palpable           Musculoskeletal: M/S 5/5 throughout.  No deformity or atrophy. No edema. Neurologic: CN 2-12 intact. Sensation grossly intact in extremities.  Symmetrical.  Speech is fluent. Motor exam as listed above. Psychiatric: Judgment intact, Mood & affect appropriate for pt's clinical situation. Dermatologic: No rashes or ulcers noted.  No cellulitis or open wounds.     CBC Lab Results  Component Value Date   WBC 5.7 08/15/2022   HGB 10.9 (L) 08/15/2022   HCT 32.0 (L) 08/15/2022   MCV 89.6 08/15/2022   PLT 244 08/15/2022    BMET    Component Value Date/Time   NA 138 08/15/2022 1958   K 4.2 08/15/2022 1958   CL 105 08/15/2022 1958   CO2 20 (L) 08/15/2022 1949   GLUCOSE 121 (H) 08/15/2022 1958   GLUCOSE 123 (H)  11/17/2006 1240   BUN 17 08/15/2022 1958   CREATININE 1.10 (H) 08/15/2022 1958   CREATININE 0.81 09/16/2014 1759   CALCIUM 8.7 (L) 08/15/2022 1949   GFRNONAA 47 (L) 08/15/2022 1949   GFRNONAA 76 09/16/2014 1759   GFRAA >60 08/08/2020 0803   GFRAA 88 09/16/2014 1759   CrCl cannot be calculated (Patient's most recent lab result is older than the maximum 21 days allowed.).  COAG Lab Results  Component Value Date   INR 0.9 08/15/2022   INR 1.46 01/24/2016   INR 1.05 08/28/2014    Radiology No results found.   Assessment/Plan Carotid stenosis, bilateral Carotid duplex today shows stenosis just into the 40 to 59% range on the right and a widely patent left carotid stent without significant recurrent stenosis.  Continue aspirin, Plavix, and her statin agent.  She has had to lower her frequency of statin use due to side effects, but this still seems to be keeping her cholesterol under good control.  Follow-up in 1 year with carotid duplex.  Essential hypertension blood pressure control important in reducing the progression of atherosclerotic disease. On appropriate oral medications.     DM (diabetes mellitus), type 2, uncontrolled w/ophthalmic complication (HCC) blood glucose control important in reducing the progression of atherosclerotic disease. Also, involved in wound healing. On appropriate medications.   Hyperlipidemia lipid control important in reducing the progression of atherosclerotic disease. Continue statin therapy   Festus Barren, MD  07/26/2023 2:40 PM    This note was created with Dragon medical transcription system.  Any errors from dictation are purely unintentional

## 2023-07-26 NOTE — Assessment & Plan Note (Signed)
Carotid duplex today shows stenosis just into the 40 to 59% range on the right and a widely patent left carotid stent without significant recurrent stenosis.  Continue aspirin, Plavix, and her statin agent.  She has had to lower her frequency of statin use due to side effects, but this still seems to be keeping her cholesterol under good control.  Follow-up in 1 year with carotid duplex.

## 2023-08-07 ENCOUNTER — Inpatient Hospital Stay (HOSPITAL_COMMUNITY)
Admission: EM | Admit: 2023-08-07 | Discharge: 2023-08-11 | DRG: 377 | Disposition: A | Discharge status: Home / Self Care | Payer: Medicare HMO | Attending: Internal Medicine | Admitting: Internal Medicine

## 2023-08-07 ENCOUNTER — Other Ambulatory Visit: Payer: Self-pay

## 2023-08-07 ENCOUNTER — Emergency Department (HOSPITAL_COMMUNITY): Payer: Medicare HMO

## 2023-08-07 ENCOUNTER — Encounter (HOSPITAL_COMMUNITY): Payer: Self-pay | Admitting: Internal Medicine

## 2023-08-07 DIAGNOSIS — I6529 Occlusion and stenosis of unspecified carotid artery: Secondary | ICD-10-CM | POA: Diagnosis present

## 2023-08-07 DIAGNOSIS — Z7982 Long term (current) use of aspirin: Secondary | ICD-10-CM

## 2023-08-07 DIAGNOSIS — Z91040 Latex allergy status: Secondary | ICD-10-CM

## 2023-08-07 DIAGNOSIS — F909 Attention-deficit hyperactivity disorder, unspecified type: Secondary | ICD-10-CM | POA: Diagnosis present

## 2023-08-07 DIAGNOSIS — Z882 Allergy status to sulfonamides status: Secondary | ICD-10-CM

## 2023-08-07 DIAGNOSIS — R578 Other shock: Secondary | ICD-10-CM | POA: Diagnosis present

## 2023-08-07 DIAGNOSIS — E119 Type 2 diabetes mellitus without complications: Secondary | ICD-10-CM

## 2023-08-07 DIAGNOSIS — Z809 Family history of malignant neoplasm, unspecified: Secondary | ICD-10-CM

## 2023-08-07 DIAGNOSIS — F17210 Nicotine dependence, cigarettes, uncomplicated: Secondary | ICD-10-CM | POA: Diagnosis present

## 2023-08-07 DIAGNOSIS — K5731 Diverticulosis of large intestine without perforation or abscess with bleeding: Principal | ICD-10-CM | POA: Diagnosis present

## 2023-08-07 DIAGNOSIS — F32A Depression, unspecified: Secondary | ICD-10-CM | POA: Diagnosis present

## 2023-08-07 DIAGNOSIS — D62 Acute posthemorrhagic anemia: Secondary | ICD-10-CM | POA: Diagnosis present

## 2023-08-07 DIAGNOSIS — K922 Gastrointestinal hemorrhage, unspecified: Principal | ICD-10-CM

## 2023-08-07 DIAGNOSIS — M797 Fibromyalgia: Secondary | ICD-10-CM | POA: Diagnosis present

## 2023-08-07 DIAGNOSIS — F17290 Nicotine dependence, other tobacco product, uncomplicated: Secondary | ICD-10-CM | POA: Diagnosis present

## 2023-08-07 DIAGNOSIS — Z6826 Body mass index (BMI) 26.0-26.9, adult: Secondary | ICD-10-CM

## 2023-08-07 DIAGNOSIS — Z7984 Long term (current) use of oral hypoglycemic drugs: Secondary | ICD-10-CM

## 2023-08-07 DIAGNOSIS — E875 Hyperkalemia: Secondary | ICD-10-CM | POA: Diagnosis present

## 2023-08-07 DIAGNOSIS — F1011 Alcohol abuse, in remission: Secondary | ICD-10-CM | POA: Diagnosis present

## 2023-08-07 DIAGNOSIS — Z888 Allergy status to other drugs, medicaments and biological substances status: Secondary | ICD-10-CM

## 2023-08-07 DIAGNOSIS — Z885 Allergy status to narcotic agent status: Secondary | ICD-10-CM

## 2023-08-07 DIAGNOSIS — Z8249 Family history of ischemic heart disease and other diseases of the circulatory system: Secondary | ICD-10-CM

## 2023-08-07 DIAGNOSIS — Z79899 Other long term (current) drug therapy: Secondary | ICD-10-CM

## 2023-08-07 DIAGNOSIS — Z7902 Long term (current) use of antithrombotics/antiplatelets: Secondary | ICD-10-CM

## 2023-08-07 DIAGNOSIS — Z7985 Long-term (current) use of injectable non-insulin antidiabetic drugs: Secondary | ICD-10-CM

## 2023-08-07 DIAGNOSIS — E785 Hyperlipidemia, unspecified: Secondary | ICD-10-CM | POA: Diagnosis present

## 2023-08-07 DIAGNOSIS — N179 Acute kidney failure, unspecified: Secondary | ICD-10-CM | POA: Diagnosis present

## 2023-08-07 DIAGNOSIS — R451 Restlessness and agitation: Secondary | ICD-10-CM | POA: Diagnosis not present

## 2023-08-07 DIAGNOSIS — E1151 Type 2 diabetes mellitus with diabetic peripheral angiopathy without gangrene: Secondary | ICD-10-CM | POA: Diagnosis present

## 2023-08-07 DIAGNOSIS — F419 Anxiety disorder, unspecified: Secondary | ICD-10-CM | POA: Diagnosis present

## 2023-08-07 DIAGNOSIS — E441 Mild protein-calorie malnutrition: Secondary | ICD-10-CM | POA: Diagnosis present

## 2023-08-07 DIAGNOSIS — I10 Essential (primary) hypertension: Secondary | ICD-10-CM | POA: Diagnosis present

## 2023-08-07 LAB — CBC
HCT: 31.5 % — ABNORMAL LOW (ref 36.0–46.0)
Hemoglobin: 9.3 g/dL — ABNORMAL LOW (ref 12.0–15.0)
MCH: 26.1 pg (ref 26.0–34.0)
MCHC: 29.5 g/dL — ABNORMAL LOW (ref 30.0–36.0)
MCV: 88.2 fL (ref 80.0–100.0)
Platelets: 268 10*3/uL (ref 150–400)
RBC: 3.57 MIL/uL — ABNORMAL LOW (ref 3.87–5.11)
RDW: 16.7 % — ABNORMAL HIGH (ref 11.5–15.5)
WBC: 9.2 10*3/uL (ref 4.0–10.5)
nRBC: 0 % (ref 0.0–0.2)

## 2023-08-07 LAB — COMPREHENSIVE METABOLIC PANEL
ALT: 11 U/L (ref 0–44)
AST: 26 U/L (ref 15–41)
Albumin: 3.3 g/dL — ABNORMAL LOW (ref 3.5–5.0)
Alkaline Phosphatase: 60 U/L (ref 38–126)
Anion gap: 8 (ref 5–15)
BUN: 36 mg/dL — ABNORMAL HIGH (ref 8–23)
CO2: 23 mmol/L (ref 22–32)
Calcium: 8 mg/dL — ABNORMAL LOW (ref 8.9–10.3)
Chloride: 105 mmol/L (ref 98–111)
Creatinine, Ser: 1.06 mg/dL — ABNORMAL HIGH (ref 0.44–1.00)
GFR, Estimated: 55 mL/min — ABNORMAL LOW (ref >60)
Glucose, Bld: 163 mg/dL — ABNORMAL HIGH (ref 70–99)
Potassium: 5.2 mmol/L — ABNORMAL HIGH (ref 3.5–5.1)
Sodium: 136 mmol/L (ref 135–145)
Total Bilirubin: 0.6 mg/dL (ref 0.3–1.2)
Total Protein: 6.2 g/dL — ABNORMAL LOW (ref 6.5–8.1)

## 2023-08-07 LAB — PROTIME-INR
INR: 0.9 (ref 0.8–1.2)
Prothrombin Time: 12.7 seconds (ref 11.4–15.2)

## 2023-08-07 LAB — PREPARE RBC (CROSSMATCH)

## 2023-08-07 LAB — HEMOGLOBIN AND HEMATOCRIT, BLOOD
HCT: 18.7 % — ABNORMAL LOW (ref 36.0–46.0)
Hemoglobin: 5.8 g/dL — CL (ref 12.0–15.0)

## 2023-08-07 LAB — APTT: aPTT: 26 seconds (ref 24–36)

## 2023-08-07 LAB — MRSA NEXT GEN BY PCR, NASAL: MRSA by PCR Next Gen: DETECTED — AB

## 2023-08-07 LAB — POC OCCULT BLOOD, ED: Fecal Occult Bld: POSITIVE — AB

## 2023-08-07 MED ORDER — NOREPINEPHRINE 4 MG/250ML-% IV SOLN
INTRAVENOUS | Status: AC
  Administered 2023-08-07: 5 ug/min via INTRAVENOUS
  Filled 2023-08-07: qty 250

## 2023-08-07 MED ORDER — FENTANYL CITRATE PF 50 MCG/ML IJ SOSY
25.0000 ug | PREFILLED_SYRINGE | INTRAMUSCULAR | Status: DC | PRN
  Administered 2023-08-07: 25 ug via INTRAVENOUS
  Filled 2023-08-07: qty 1

## 2023-08-07 MED ORDER — SODIUM CHLORIDE 0.9% FLUSH
10.0000 mL | Freq: Two times a day (BID) | INTRAVENOUS | Status: DC
  Administered 2023-08-07 – 2023-08-08 (×2): 10 mL
  Administered 2023-08-08: 30 mL
  Administered 2023-08-09 (×2): 10 mL
  Administered 2023-08-10: 20 mL
  Administered 2023-08-10 – 2023-08-11 (×2): 10 mL

## 2023-08-07 MED ORDER — ONDANSETRON HCL 4 MG/2ML IJ SOLN
4.0000 mg | Freq: Once | INTRAMUSCULAR | Status: AC
  Administered 2023-08-07: 4 mg via INTRAVENOUS
  Filled 2023-08-07: qty 2

## 2023-08-07 MED ORDER — LIDOCAINE-PRILOCAINE 2.5-2.5 % EX CREA
TOPICAL_CREAM | Freq: Three times a day (TID) | CUTANEOUS | Status: DC | PRN
  Filled 2023-08-07: qty 5

## 2023-08-07 MED ORDER — SODIUM CHLORIDE 0.9 % IV BOLUS
1000.0000 mL | Freq: Once | INTRAVENOUS | Status: AC
  Administered 2023-08-07: 1000 mL via INTRAVENOUS

## 2023-08-07 MED ORDER — SODIUM CHLORIDE 0.9 % IV SOLN: INTRAVENOUS | Status: AC

## 2023-08-07 MED ORDER — SODIUM CHLORIDE 0.9% IV SOLUTION: Freq: Once | INTRAVENOUS | Status: DC

## 2023-08-07 MED ORDER — SODIUM CHLORIDE 0.9 % IV SOLN: 20.0000 ug | Freq: Once | INTRAVENOUS | Status: DC

## 2023-08-07 MED ORDER — SODIUM CHLORIDE 0.9% FLUSH: 10.0000 mL | INTRAVENOUS | Status: DC | PRN

## 2023-08-07 MED ORDER — PANTOPRAZOLE SODIUM 40 MG IV SOLR
40.0000 mg | INTRAVENOUS | Status: DC
  Administered 2023-08-07 – 2023-08-11 (×5): 40 mg via INTRAVENOUS
  Filled 2023-08-07 (×5): qty 10

## 2023-08-07 MED ORDER — SODIUM CHLORIDE 0.9 % IV SOLN: INTRAVENOUS | Status: DC | PRN

## 2023-08-07 MED ORDER — MUPIROCIN 2 % EX OINT
1.0000 | TOPICAL_OINTMENT | Freq: Two times a day (BID) | CUTANEOUS | Status: DC
  Administered 2023-08-07 – 2023-08-11 (×5): 1 via NASAL
  Filled 2023-08-07: qty 22

## 2023-08-07 MED ORDER — CHLORHEXIDINE GLUCONATE CLOTH 2 % EX PADS: 6.0000 | MEDICATED_PAD | Freq: Every day | CUTANEOUS | Status: DC

## 2023-08-07 MED ORDER — SODIUM CHLORIDE 0.9 % IV SOLN
20.0000 ug | Freq: Once | INTRAVENOUS | Status: AC
  Administered 2023-08-07: 20 ug via INTRAVENOUS
  Filled 2023-08-07: qty 5

## 2023-08-07 MED ORDER — SODIUM CHLORIDE 0.9 % IV SOLN: 250.0000 mL | INTRAVENOUS | Status: DC

## 2023-08-07 MED ORDER — IOHEXOL 350 MG/ML SOLN
100.0000 mL | Freq: Once | INTRAVENOUS | Status: AC | PRN
  Administered 2023-08-07: 100 mL via INTRAVENOUS

## 2023-08-07 MED ORDER — ACETAMINOPHEN 650 MG RE SUPP: 650.0000 mg | Freq: Four times a day (QID) | RECTAL | Status: DC | PRN

## 2023-08-07 MED ORDER — ONDANSETRON HCL 4 MG/2ML IJ SOLN: 4.0000 mg | Freq: Four times a day (QID) | INTRAMUSCULAR | Status: DC | PRN

## 2023-08-07 MED ORDER — ACETAMINOPHEN 325 MG PO TABS
650.0000 mg | ORAL_TABLET | Freq: Four times a day (QID) | ORAL | Status: DC | PRN
  Administered 2023-08-09: 650 mg via ORAL
  Filled 2023-08-07: qty 2

## 2023-08-07 MED ORDER — FENTANYL CITRATE PF 50 MCG/ML IJ SOSY
25.0000 ug | PREFILLED_SYRINGE | Freq: Once | INTRAMUSCULAR | Status: AC
  Administered 2023-08-07: 25 ug via INTRAVENOUS
  Filled 2023-08-07: qty 1

## 2023-08-07 MED ORDER — FENTANYL CITRATE PF 50 MCG/ML IJ SOSY
50.0000 ug | PREFILLED_SYRINGE | INTRAMUSCULAR | Status: DC | PRN
  Administered 2023-08-07 – 2023-08-08 (×2): 50 ug via INTRAVENOUS
  Filled 2023-08-07 (×3): qty 1

## 2023-08-07 MED ORDER — CHLORHEXIDINE GLUCONATE CLOTH 2 % EX PADS
6.0000 | MEDICATED_PAD | Freq: Every day | CUTANEOUS | Status: DC
  Administered 2023-08-07 – 2023-08-11 (×4): 6 via TOPICAL

## 2023-08-07 MED ORDER — NOREPINEPHRINE 4 MG/250ML-% IV SOLN: 0.0000 ug/min | INTRAVENOUS | Status: DC

## 2023-08-07 MED ORDER — ONDANSETRON HCL 4 MG PO TABS: 4.0000 mg | ORAL_TABLET | Freq: Four times a day (QID) | ORAL | Status: DC | PRN

## 2023-08-07 MED ORDER — SODIUM CHLORIDE 0.9 % IV BOLUS
500.0000 mL | Freq: Once | INTRAVENOUS | Status: AC
  Administered 2023-08-07: 500 mL via INTRAVENOUS

## 2023-08-07 NOTE — Progress Notes (Addendum)
eLink Physician-Brief Progress Note Patient Name: [name] DOB: May 14, 1946 MRN: [id number]   Date of Service  08/07/2023  HPI/Events of Note  77 year old female with a history of alcohol use disorder with metabolic syndrome and a history of diverticulosis/diverticulitis who initially presented with hematochezia in the setting of dual antiplatelet therapy for carotid artery disease.  Initial hemoglobin 9.3 with otherwise normal platelet/INR.  GI was consulted and recommended correction of underlying coagulopathy with conservative management.  Has received more than 3.5 L of crystalloid solution.  Received 1 unit of PRBC emergently.  Currently has 1 18-gauge midline and an additional PICC pending.  Patient had a large amount of bright red rectal bleeding as soon as emergent PRBC completed.  Hypotensive 60s over 40s.  Additional 500 cc crystalloid bolus was ordered by primary team and the patient was made Trendelenburg.  eICU Interventions  Additional unit of emergent blood to be ordered now.  Placed 2 units on hold as soon as type and screen is resolved.  Adequate IV access, will obtain arterial line  Transient norepinephrine as needed.  Patient to be transitioned to ICU service given one-to-one nursing made and hypotension requiring vasopressors.  Mentating appropriately, protecting airway, no emergent procedures at this time.  Labs pending every 6 hours.  Repeat CTA bleed study pending.   2138 -persistent abdominal/rectal pain.  Refractory to fentanyl 25 mcg.  Mentating appropriately.  Will add Emla cream and increase fentanyl to 50 mcg as needed.  Undergoing line placement.  Intervention Category Major Interventions: Hemorrhage - evaluation and management  Kiandria Clum 08/07/2023, 8:17 PM

## 2023-08-07 NOTE — Consult Note (Signed)
NAME:  [name], MRN:  [id number], DOB:  07-10-1946, LOS: 0 ADMISSION DATE:  08/07/2023, CONSULTATION DATE:  08/07/23 REFERRING MD:  Bobette Mo, MD CHIEF COMPLAINT:  Acute blood loss anemia   History of Present Illness:  77 year old female with hx alcohol abuse (last drink 8 years), carotid artery stenosis, HLD, HTN, DM2, fibromyalgia, hx diverticulosis/diverticulitis who presents with hematochezia associated with gurgling of her stomach that began on morning of admission. On DAPT for carotid artery stenosis. She denied abdominal pain, dizziness, syncope. Denies frequent NSAID use. In the EDS she is hemodynamically stable on room air. Labs with Hg 9.3, Plt 268. INR 12.7/0.9. K 5.2 with BUN/Cr 36/1.06. Admitted by [facility]. GI consulted  PCCM consulted in setting of acute rectal bleed with borderline low-normal pressures. Has received 3.5L NS. Patient in Trendelenburg with SBP 110s. Reports abdominal gurgling and discomfort  Pertinent  Medical History  Alcohol abuse, carotid artery stenosis, HLD, HTN, DM2, fibromyalgia, hx diverticulosis/diverticulitis   Significant Hospital Events: Including procedures, antibiotic start and stop dates in addition to other pertinent events   8/25 PCCM consulted  Interim History / Subjective:  As above  Objective   Blood pressure (!) 94/26, pulse (!) 56, temperature (!) 97.1 F (36.2 C), temperature source Oral, resp. rate 13, height 5\' 3"  (1.6 m), weight 70.2 kg, SpO2 99%.        Intake/Output Summary (Last 24 hours) at 08/07/2023 1817 Last data filed at 08/07/2023 1803 Gross per 24 hour  Intake 2125.98 ml  Output --  Net 2125.98 ml   Filed Weights   08/07/23 0840 08/07/23 1417  Weight: 68 kg 70.2 kg   Physical Exam: General: Critically ill-appearing, mild distress HENT: [place], AT, OP clear, MMM Eyes: EOMI, no scleral icterus, pale conjunctiva Respiratory: Clear to auscultation bilaterally.  No crackles, wheezing or rales Cardiovascular:  RRR, -M/R/G, no JVD GI: BS+, soft, mild diffuse tenderness Extremities:-Edema,-tenderness Neuro: AAO x4, CNII-XII grossly intact  CTA 10:15 AM 1. No acute or focal lesion to explain the patient's lower GI bleed. No arterial enhancement within the bowel. 2. 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 3. Moderate stenosis of the proximal SMA. 4. High-grade stenosis of the IMA. 5. 50% stenosis of the right renal artery. 6. High-grade stenosis of the left lower pole renal artery. 7. 16 mm simple cyst in the right kidney. No follow-up imaging is recommended. JACR 2018 Feb; 264-273, Management of the Incidental Renal Mass on CT, [employer] 2021; 814-848, Bosniak Classification of Cystic Renal Masses, Version 2019. 8. Degenerative retrolisthesis at L1-2 measures 6 mm. 9. Mild diffuse wall thickening in the urinary bladder is nonspecific, but can be seen in the setting of cystitis. 10.  Aortic Atherosclerosis (ICD10-I70.0).  Resolved Hospital Problem list   N/A  Assessment & Plan:  Acute blood loss anemia 2/2 hematochezia LGIB, suspected diverticular --Initial CTA neg. Will need repeat CTA if bleed persists --GI following. Initially recommended supportive management. Hospitalist team contacting them again --NPO --Emergent PRBC x 1 and platelets x 1 ordered --Type and screen --Trend CBC  AKI, prerenal Mild hyperkalemia --Trend UOP/Cr  Carotid artery stenosis HTN HLD --Hold ASA/Plavix --Holding home antihypertensive agents  DM2 --SSI  ADHD Anxiety/depression Fibromyalgia --Holding home meds --Reassess when HDS  Best Practice (right click and "Reselect all SmartList Selections" daily)   Diet/type: NPO DVT prophylaxis: other holding for bleed GI prophylaxis: PPI Lines: N/A Foley:  N/A Code Status:  full code Last date of multidisciplinary goals of care  discussion [8/25]  Labs   CBC: Recent Labs  Lab 08/07/23 0919  WBC 9.2  HGB 9.3*  HCT 31.5*   MCV 88.2  PLT 268    Basic Metabolic Panel: Recent Labs  Lab 08/07/23 0919  NA 136  K 5.2*  CL 105  CO2 23  GLUCOSE 163*  BUN 36*  CREATININE 1.06*  CALCIUM 8.0*   GFR: Estimated Creatinine Clearance: 43.1 mL/min (A) (by C-G formula based on SCr of 1.06 mg/dL (H)). Recent Labs  Lab 08/07/23 0919  WBC 9.2    Liver Function Tests: Recent Labs  Lab 08/07/23 0919  AST 26  ALT 11  ALKPHOS 60  BILITOT 0.6  PROT 6.2*  ALBUMIN 3.3*   No results for input(s): "LIPASE", "AMYLASE" in the last 168 hours. No results for input(s): "AMMONIA" in the last 168 hours.  ABG    Component Value Date/Time   TCO2 20 (L) 08/15/2022 1958     Coagulation Profile: Recent Labs  Lab 08/07/23 0944  INR 0.9    Cardiac Enzymes: No results for input(s): "CKTOTAL", "CKMB", "CKMBINDEX", "TROPONINI" in the last 168 hours.  HbA1C: Hgb A1c MFr Bld  Date/Time Value Ref Range Status  01/25/2016 08:05 AM 8.1 (H) 4.8 - 5.6 % Final    Comment:    (NOTE)         Pre-diabetes: 5.7 - 6.4         Diabetes: >6.4         Glycemic control for adults with diabetes: <7.0   02/10/2015 02:58 PM 6.8 (H) 4.6 - 6.5 % Final    Comment:    Glycemic Control Guidelines for People with Diabetes:Non Diabetic:  <6%Goal of Therapy: <7%Additional Action Suggested:  >8%     CBG: No results for input(s): "GLUCAP" in the last 168 hours.  Review of Systems:   Review of Systems  Constitutional:  Negative for chills, diaphoresis, fever, malaise/fatigue and weight loss.  HENT:  Negative for congestion.   Respiratory:  Negative for cough, hemoptysis, sputum production, shortness of breath and wheezing.   Cardiovascular:  Negative for chest pain, palpitations and leg swelling.  Gastrointestinal:  Positive for abdominal pain and blood in stool.     Past Medical History:  She,  has a past medical history of Alcohol abuse, CAP (community acquired pneumonia) (05/05/2018), Carotid artery stenosis, Depression,  Diabetes mellitus, Diverticulitis, Essential hypertension (08/20/2007), Fibromyalgia, Hepatitis (08/28/2014), Hyperlipidemia (08/20/2007), Hypertension, and Myalgia and myositis (03/28/2012).   Surgical History:   Past Surgical History:  Procedure Laterality Date   BUNIONECTOMY     CAROTID PTA/STENT INTERVENTION Left 08/07/2020   Procedure: CAROTID PTA/STENT INTERVENTION;  Surgeon: Annice Needy, MD;  Location: [facility];  Service: Cardiovascular;  Laterality: Left;   CESAREAN SECTION     COLONOSCOPY     COLONOSCOPY WITH ESOPHAGOGASTRODUODENOSCOPY (EGD)     COLONOSCOPY WITH PROPOFOL N/A 02/18/2021   Procedure: COLONOSCOPY WITH PROPOFOL;  Surgeon: Toledo, Boykin Nearing, MD;  Location: [facility];  Service: Gastroenterology;  Laterality: N/A;   ESOPHAGOGASTRODUODENOSCOPY (EGD) WITH PROPOFOL N/A 02/18/2021   Procedure: ESOPHAGOGASTRODUODENOSCOPY (EGD) WITH PROPOFOL;  Surgeon: Toledo, Boykin Nearing, MD;  Location: [facility] END[facility];  Service: Gastroenterology;  Laterality: N/A;   TONSILLECTOMY       Social History:   reports that she has been smoking cigarettes and e-cigarettes. She has never used smokeless tobacco. She reports that she does not currently use alcohol. She reports that she does not use drugs.   Family History:  Her family history includes Cancer in her mother; Heart disease in her father and sister.   Allergies Allergies  Allergen Reactions   Latex Hives   Lipitor [Atorvastatin Calcium] Other (See Comments)    Muscle aches   Codeine Rash    And nausea    Pentazocine Lactate Rash   Sulfonamide Derivatives Rash     Home Medications  Prior to Admission medications   Medication Sig Start Date End Date Taking? Authorizing Provider  amphetamine-dextroamphetamine (ADDERALL XR) 20 MG 24 hr capsule Take 40 mg by mouth every morning. 06/29/23   [provider]  aspirin EC 81 MG EC tablet Take 1 tablet (81 mg total) by mouth daily. Swallow whole. 08/08/20   Stegmayer,  Ranae Plumber, PA-C  BYSTOLIC 5 MG tablet TAKE 1 TABLET DAILY 09/26/15   Carlus Pavlov, MD  clopidogrel (PLAVIX) 75 MG tablet TAKE 1 TABLET BY MOUTH EVERY DAY 07/18/23   Georgiana Spinner, NP  diphenoxylate-atropine (LOMOTIL) 2.5-0.025 MG tablet Take 1 tablet by mouth 4 (four) times daily as needed for diarrhea or loose stools.  07/08/10   [provider]  doxycycline (VIBRA-TABS) 100 MG tablet Take 1 tablet (100 mg total) by mouth 2 (two) times daily. Patient not taking: Reported on 07/26/2023 07/15/22   Edwin Cap, DPM  Dulaglutide (TRULICITY) 3 MG/0.5ML SOPN Inject 3 mg into the skin once a week. 06/23/23     Dulaglutide 0.75 MG/0.5ML SOPN Inject 3 mg into the skin once a week.     [provider]  empagliflozin (JARDIANCE) 25 MG TABS tablet Take 25 mg by mouth daily.    [provider]  EPINEPHrine 0.3 mg/0.3 mL IJ SOAJ injection Inject into the muscle.    [provider]  glucose blood (BAYER CONTOUR NEXT TEST) test strip Use to test blood sugar 3 times daily as instructed. Dx code: E11.65 02/10/15   Carlus Pavlov, MD  Lancets (ACCU-CHEK MULTICLIX) lancets Use to test blood sugar 3 times daily as instructed. Dx: E11.65 02/10/15   Carlus Pavlov, MD  lisdexamfetamine (VYVANSE) 50 MG capsule Take 50 mg by mouth daily.    [provider]  lisinopril (PRINIVIL,ZESTRIL) 5 MG tablet TAKE 1 TABLET BY MOUTH EVERY DAY 07/14/15   Carlus Pavlov, MD  LORazepam (ATIVAN) 0.5 MG tablet Take 1 tablet (0.5 mg total) by mouth daily as needed for anxiety. 09/16/14   Doris Cheadle, MD  metFORMIN (GLUCOPHAGE) 1000 MG tablet SMARTSIG:1 By Mouth 10/19/21   [provider]  mupirocin ointment (BACTROBAN) 2 % Apply 1 Application topically 2 (two) times daily. 07/15/22   Edwin Cap, DPM  omeprazole (PRILOSEC) 40 MG capsule TK 1 C PO D 08/30/18   [provider]  pregabalin (LYRICA) 300 MG capsule Take 300 mg by mouth 2 (two) times daily. 08/05/20    [provider]  rosuvastatin (CRESTOR) 5 MG tablet Take by mouth. 07/26/18   [provider]  sitaGLIPtin (JANUVIA) 100 MG tablet TAKE 1 TABLET BY MOUTH EVERY DAY. Patient not taking: Reported on 07/26/2023 10/02/18   [provider]  traMADol (ULTRAM) 50 MG tablet TAKE 1 TABLET(50 MG) BY MOUTH EVERY 6 HOURS FOR UP TO 5 DAYS AS NEEDED 05/04/22   Edwin Cap, DPM  tretinoin (RETIN-A) 0.1 % cream SMARTSIG:sparingly Topical Every Night 08/26/22   [provider]  Vitamin D, Ergocalciferol, (DRISDOL) 1.25 MG (50000 UNIT) CAPS capsule TAKE 1 CAPSULE BY MOUTH EVERY 7 DAYS FOR 8 DOSES Patient not taking: Reported  on 07/26/2023 08/23/22   Edwin Cap, DPM     Critical care time: 30 min    The patient is critically ill with multiple organ systems failure and requires high complexity decision making for assessment and support, frequent evaluation and titration of therapies, application of advanced monitoring technologies and extensive interpretation of multiple databases.    [name], M.D. [facility] Pulmonary/Critical Care Medicine 08/07/2023 6:17 PM   Please see Amion for pager number to reach on-call Pulmonary and Critical Care Team.

## 2023-08-07 NOTE — ED Notes (Signed)
Patient transported to CT 

## 2023-08-07 NOTE — ED Triage Notes (Signed)
Pt arrived via POV. C/o bright red blood in stool that began this AM. No abd pain. Hx colitis.  Takes Plavix  AOx4

## 2023-08-07 NOTE — ED Notes (Signed)
Pt. Stool is bright red and says she feels like she has to keep going. Nurse notified.

## 2023-08-07 NOTE — Progress Notes (Signed)
[facility] admitting physician addendum:  The patient was seen due to hypotension in the setting of multiple episodes of hematochezia after arrival to the ICU.  Normal saline bolus ordered and.  PICC line team and PCCM consulted.  They ordered emergent blood and platelet transfusion.  GI contacted and recommended repeat CTA.  I spoke to interventional radiology on-call (Dr. Miles Costain) to make him aware of upcoming repeat CTA study, once more stable to go to the CT suite, in case there is any need for embolization.  He recommended giving desmopressin 20 mcg IVPB x 1, which was ordered after being discussed with GI on-call (Dr. Marca Ancona) as well.  Sanda Klein, MD.

## 2023-08-07 NOTE — ED Provider Notes (Signed)
[place] EMERGENCY DEPARTMENT AT [facility] Provider Note   CSN: [id number] Arrival date & time: 08/07/23  1308     History  Chief Complaint  Patient presents with   Rectal Bleeding    [name] is a 77 y.o. female, history of carotid artery stenosis, alcohol abuse, diabetes, who presents to the ED secondary to bright red blood from the rectum, that started this morning.  She states that yesterday, she had some bilateral lower quadrant abdominal pain, that was sharp and stabbing, associated with a few loose stools, this resolved, and she states that loose stools are normal for her, but this morning she woke up and felt some pressure around her anus, she had to go to the bathroom, and that she had a large amount of bright red blood in the toilet.  She denies any dizziness, shortness of breath, chest pain, or current abdominal pain.  Is on Plavix and aspirin has been compliant with these.  Does not drink alcohol currently.   Home Medications Prior to Admission medications   Medication Sig Start Date End Date Taking? Authorizing Provider  amphetamine-dextroamphetamine (ADDERALL XR) 20 MG 24 hr capsule Take 40 mg by mouth every morning. 06/29/23   [provider]  aspirin EC 81 MG EC tablet Take 1 tablet (81 mg total) by mouth daily. Swallow whole. 08/08/20   Stegmayer, Ranae Plumber, PA-C  BYSTOLIC 5 MG tablet TAKE 1 TABLET DAILY 09/26/15   Carlus Pavlov, MD  clopidogrel (PLAVIX) 75 MG tablet TAKE 1 TABLET BY MOUTH EVERY DAY 07/18/23   Georgiana Spinner, NP  diphenoxylate-atropine (LOMOTIL) 2.5-0.025 MG tablet Take 1 tablet by mouth 4 (four) times daily as needed for diarrhea or loose stools.  07/08/10   [provider]  doxycycline (VIBRA-TABS) 100 MG tablet Take 1 tablet (100 mg total) by mouth 2 (two) times daily. Patient not taking: Reported on 07/26/2023 07/15/22   Edwin Cap, DPM  Dulaglutide (TRULICITY) 3 MG/0.5ML SOPN Inject 3 mg into the skin once a  week. 06/23/23     Dulaglutide 0.75 MG/0.5ML SOPN Inject 3 mg into the skin once a week.     [provider]  empagliflozin (JARDIANCE) 25 MG TABS tablet Take 25 mg by mouth daily.    [provider]  EPINEPHrine 0.3 mg/0.3 mL IJ SOAJ injection Inject into the muscle.    [provider]  glucose blood (BAYER CONTOUR NEXT TEST) test strip Use to test blood sugar 3 times daily as instructed. Dx code: E11.65 02/10/15   Carlus Pavlov, MD  Lancets (ACCU-CHEK MULTICLIX) lancets Use to test blood sugar 3 times daily as instructed. Dx: E11.65 02/10/15   Carlus Pavlov, MD  lisdexamfetamine (VYVANSE) 50 MG capsule Take 50 mg by mouth daily.    [provider]  lisinopril (PRINIVIL,ZESTRIL) 5 MG tablet TAKE 1 TABLET BY MOUTH EVERY DAY 07/14/15   Carlus Pavlov, MD  LORazepam (ATIVAN) 0.5 MG tablet Take 1 tablet (0.5 mg total) by mouth daily as needed for anxiety. 09/16/14   Doris Cheadle, MD  metFORMIN (GLUCOPHAGE) 1000 MG tablet SMARTSIG:1 By Mouth 10/19/21   [provider]  mupirocin ointment (BACTROBAN) 2 % Apply 1 Application topically 2 (two) times daily. 07/15/22   Edwin Cap, DPM  omeprazole (PRILOSEC) 40 MG capsule TK 1 C PO D 08/30/18   [provider]  pregabalin (LYRICA) 300 MG capsule Take 300 mg by mouth 2 (two) times daily. 08/05/20   [provider]  rosuvastatin (CRESTOR) 5 MG tablet Take by mouth. 07/26/18   [provider]  sitaGLIPtin (JANUVIA) 100 MG tablet TAKE 1 TABLET BY MOUTH EVERY DAY. Patient not taking: Reported on 07/26/2023 10/02/18   [provider]  traMADol (ULTRAM) 50 MG tablet TAKE 1 TABLET(50 MG) BY MOUTH EVERY 6 HOURS FOR UP TO 5 DAYS AS NEEDED 05/04/22   Edwin Cap, DPM  tretinoin (RETIN-A) 0.1 % cream SMARTSIG:sparingly Topical Every Night 08/26/22   [provider]  Vitamin D, Ergocalciferol, (DRISDOL) 1.25 MG (50000 UNIT) CAPS capsule TAKE 1 CAPSULE BY MOUTH EVERY 7 DAYS  FOR 8 DOSES Patient not taking: Reported on 07/26/2023 08/23/22   Edwin Cap, DPM      Allergies    Latex, Lipitor [atorvastatin calcium], Codeine, Pentazocine lactate, and Sulfonamide derivatives    Review of Systems   Review of Systems  Gastrointestinal:  Positive for hematochezia.    Physical Exam Updated Vital Signs BP (!) 129/53   Pulse (!) 59   Temp 98 F (36.7 C)   Resp 18   Ht 5\' 3"  (1.6 m)   Wt 68 kg   SpO2 100%   BMI 26.57 kg/m  Physical Exam Vitals and nursing note reviewed.  Constitutional:      General: She is not in acute distress.    Appearance: She is well-developed.  HENT:     Head: Normocephalic and atraumatic.  Eyes:     Conjunctiva/sclera: Conjunctivae normal.  Cardiovascular:     Rate and Rhythm: Normal rate and regular rhythm.     Heart sounds: No murmur heard. Pulmonary:     Effort: Pulmonary effort is normal. No respiratory distress.     Breath sounds: Normal breath sounds.  Abdominal:     Palpations: Abdomen is soft.     Tenderness: There is no abdominal tenderness.  Genitourinary:    Comments: +bright red blood on hemoccult. No anal tenderness or fissures noted.  Musculoskeletal:        General: No swelling.     Cervical back: Neck supple.  Skin:    General: Skin is warm and dry.     Capillary Refill: Capillary refill takes less than 2 seconds.  Neurological:     Mental Status: She is alert.  Psychiatric:        Mood and Affect: Mood normal.     ED Results / Procedures / Treatments   Labs (all labs ordered are listed, but only abnormal results are displayed) Labs Reviewed  COMPREHENSIVE METABOLIC PANEL - Abnormal; Notable for the following components:      Result Value   Potassium 5.2 (*)    Glucose, Bld 163 (*)    BUN 36 (*)    Creatinine, Ser 1.06 (*)    Calcium 8.0 (*)    Total Protein 6.2 (*)    Albumin 3.3 (*)    GFR, Estimated 55 (*)    All other components within normal limits  CBC - Abnormal; Notable for the  following components:   RBC 3.57 (*)    Hemoglobin 9.3 (*)    HCT 31.5 (*)    MCHC 29.5 (*)    RDW 16.7 (*)    All other components within normal limits  POC OCCULT BLOOD, ED - Abnormal; Notable for the following components:   Fecal Occult Bld POSITIVE (*)    All other components within normal limits  PROTIME-INR  APTT  CBC WITH DIFFERENTIAL/PLATELET  POTASSIUM  HEMOGLOBIN AND HEMATOCRIT, BLOOD  HEMOGLOBIN  AND HEMATOCRIT, BLOOD  TYPE AND SCREEN    EKG None  Radiology CT ANGIO GI BLEED  Result Date: 08/07/2023 CLINICAL DATA:  Lower GI bleed EXAM: CTA ABDOMEN AND PELVIS WITHOUT AND WITH CONTRAST TECHNIQUE: Multidetector CT imaging of the abdomen and pelvis was performed using the standard protocol during bolus administration of intravenous contrast. Multiplanar reconstructed images and MIPs were obtained and reviewed to evaluate the vascular anatomy. RADIATION DOSE REDUCTION: This exam was performed according to the departmental dose-optimization program which includes automated exposure control, adjustment of the mA and/or kV according to patient size and/or use of iterative reconstruction technique. CONTRAST:  OMNIPAQUE IOHEXOL 350 MG/ML SOLN COMPARISON:  Abdominal ultrasound 01/24/2016. CT of the abdomen and pelvis with contrast 08/28/2014. FINDINGS: VASCULAR Aorta: Diffuse atherosclerotic changes are present throughout the abdominal aorta. No aneurysm or focal stenosis is present. 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 The lumen is narrowed to 1 mm compared with a more normal distal segment at 5.7 mm. The more distal branch vessels are within normal limits. SMA: Atherosclerotic calcifications are present at the ostium. The lumen slightly more distal is narrowed to 2 mm. This compares with 5.7 mm slightly more distal. The branch vessels are within normal limits. Renals: Atherosclerotic changes are present at the ostium of the right renal artery. A 50%  stenosis is present. High-grade stenosis is present in the left lower pole renal artery. The upper pole renal artery is unremarkable. IMA: A high-grade stenosis is present at the ostium of the IMA. More distal branch vessels are within normal limits. Inflow: Atherosclerotic calcifications scratched at atherosclerotic changes are present in the proximal iliac arteries bilaterally without a significant stenosis. Proximal Outflow: Bilateral common femoral and visualized portions of the superficial and profunda femoral arteries are patent without evidence of aneurysm, dissection, vasculitis or significant stenosis. Veins: No obvious venous abnormality within the limitations of this arterial phase study. Review of the MIP images confirms the above findings. NON-VASCULAR Lower chest: Mild dependent fibrotic change or scarring is present. The lungs are otherwise clear. The heart size is normal. No significant pleural or pericardial effusion is present. Hepatobiliary: No focal liver abnormality is seen. No gallstones, gallbladder wall thickening, or biliary dilatation. Pancreas: Unremarkable. No pancreatic ductal dilatation or surrounding inflammatory changes. Spleen: Normal in size without focal abnormality. Adrenals/Urinary Tract: Adrenal glands are normal bilaterally. A simple cyst anteriorly in the right kidney measures 16 mm. No other focal lesions are present. No stone or obstruction is present. Ureters are within normal limits bilaterally. Mild diffuse wall thickening is present in the urinary bladder. No discrete lesions are present. Stomach/Bowel: The stomach and duodenum are within normal limits. Yony Roulston bowel is unremarkable. Terminal ileum is within normal limits. The appendix is visualized and normal. The ascending and transverse colon are within normal limits. The descending and sigmoid colon are unremarkable. Lymphatic: No significant abdominal adenopathy is present. Reproductive: Uterus and bilateral adnexa  are unremarkable. Other: No abdominal wall hernia or abnormality. No abdominopelvic ascites. Musculoskeletal: Degenerative retrolisthesis at L1-2 measures 6 mm. No other significant listhesis is present. Bony pelvis is normal. The hips are located and within normal limits bilaterally. IMPRESSION: 1. No acute or focal lesion to explain the patient's lower GI bleed. No arterial enhancement within the bowel. 2. 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 3. Moderate stenosis of the proximal SMA. 4. High-grade stenosis of the IMA. 5. 50% stenosis of the right renal artery. 6. High-grade stenosis of the  left lower pole renal artery. 7. 16 mm simple cyst in the right kidney. No follow-up imaging is recommended. JACR 2018 Feb; 264-273, Management of the Incidental Renal Mass on CT, [employer] 2021; 814-848, Bosniak Classification of Cystic Renal Masses, Version 2019. 8. Degenerative retrolisthesis at L1-2 measures 6 mm. 9. Mild diffuse wall thickening in the urinary bladder is nonspecific, but can be seen in the setting of cystitis. 10.  Aortic Atherosclerosis (ICD10-I70.0). Electronically Signed   By: Marin Roberts M.D.   On: 08/07/2023 11:27    Procedures Procedures    Medications Ordered in ED Medications  pantoprazole (PROTONIX) injection 40 mg (has no administration in time range)  0.9 %  sodium chloride infusion (has no administration in time range)  acetaminophen (TYLENOL) tablet 650 mg (has no administration in time range)    Or  acetaminophen (TYLENOL) suppository 650 mg (has no administration in time range)  ondansetron (ZOFRAN) tablet 4 mg (has no administration in time range)    Or  ondansetron (ZOFRAN) injection 4 mg (has no administration in time range)  sodium chloride 0.9 % bolus 1,000 mL (1,000 mLs Intravenous New Bag/Given 08/07/23 1008)  iohexol (OMNIPAQUE) 350 MG/ML injection 100 mL (100 mLs Intravenous Contrast Given 08/07/23 1025)    ED Course/ Medical Decision  Making/ A&P                                 Medical Decision Making Patient is a 77 year old female, history of carotid artery stenosis, on Plavix and aspirin, who presents to the ED secondary to bright red blood from her rectum, just woke up like this, and has had several bowel movements, and just blood when she attempted to the toilet, and in her underwear.  On exam she does have gross amount of blood, in her rectum.  We will obtain type and screen, CBC, CMP as well as GI bleed, protocol given history of stenosis  Amount and/or Complexity of Data Reviewed Labs: ordered.    Details: Hemoglobin of 9.3,  positive Hemoccult, K of 5.2 Radiology: ordered.    Details: GI bleed protocol, shows no evidence of any kind of the focal lesion to explain the patient's GI bleed.  She does have significant stent stenosis of the celiac artery, and several other areas Discussion of management or test interpretation with external provider(s): Discussed with patient, I believe that she has a GI bleed, and given that she has a gross amount of blood, on exam, as well as on Plavix and aspirin I discussed this with Dr. Bosie Clos, and he agrees with admission.  Admitted to Dr. Robb Matar, for further management and trending of hemoglobin.  Dr. Bosie Clos will consult.  She was mildly hypotensive on exam, so she is given 1 L fluids, and blood pressure has now normalized.  At this time she is overall well-appearing, hemoglobin is above 8, no need for transfusion.  Risk Prescription drug management. Decision regarding hospitalization.    Final Clinical Impression(s) / ED Diagnoses Final diagnoses:  Lower GI bleed    Rx / DC Orders ED Discharge Orders     None         Mileah Hemmer Elbert Ewings, PA 08/07/23 1231    Elayne Snare K, DO 08/07/23 1505

## 2023-08-07 NOTE — H&P (Signed)
History and Physical    Patient: [name] XBJ:[id number] DOB: [birth year] DOA: 08/07/2023 DOS: the patient was seen and examined on 08/07/2023 PCP: Marguarite Arbour, MD  Patient coming from: Home  Chief Complaint:  Chief Complaint  Patient presents with   Rectal Bleeding   HPI: [name] is a 77 y.o. female with medical history significant of alcohol abuse, history of alcohol withdrawal, community-acquired pneumonia, carotid artery stenosis, hyperlipidemia, hypertension, depression, type 2 diabetes, diverticulosis, diverticulitis, fibromyalgia, history of unspecified hepatitis, hypertension, myalgia and myositis who presented to the emergency department complaints of having at least 5 different episodes of hematochezia since earlier this morning associated with generalized abdominal mild tenderness or discomfort.  She is on DAPT with ASA and clopidogrel for carotid artery stenosis.  No fever, chills or night sweats. No sore throat, rhinorrhea, dyspnea, wheezing or hemoptysis.  No chest pain, palpitations, diaphoresis, PND, orthopnea or pitting edema of the lower extremities.  No appetite changes, abdominal pain, diarrhea, constipation, melena or hematochezia.  No flank pain, dysuria, frequency or hematuria.  No polyuria, polydipsia, polyphagia or blurred vision.  Lab work: CBC showed a white count of 9.2, hemoglobin 9.3 g/dL and platelets 308.  Normal PT, INR and PTT.  CMP showed a potassium of 5.2 mmol/L, glucose 163, BUN 36, creatinine 1.06 and corrected calcium 8.6 mg/dL.  The rest of the LFTs and electrolytes were normal.  Imaging: CTA GI bleed with no acute or focal lesion to explain the patient's lower GI bleed.  No arterial enhancement within the bowel.  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  Moderate stenosis of the proximal SMA.  High-grade stenosis of the IMA.  50% stenosis of the right renal arteries.  High-grade stenosis of the left lower pole renal artery.  60  mm simple cyst of the right kidney.  No follow-up needed.  L1-L2 degenerative retrolisthesis.  Mild diffuse wall thickening in the urinary bladder.  Aortic atherosclerosis.   ED course: Initial vital signs were temperature 97.8 F, pulse 85, respiration 15, BP 92/50 mmHg O2 sat 100% on room air.  The patient received 1000 mL of normal saline bolus.  [facility] consulted.  Review of Systems: As mentioned in the history of present illness. All other systems reviewed and are negative. Past Medical History:  Diagnosis Date   Alcohol abuse    CAP (community acquired pneumonia) 05/05/2018   Carotid artery stenosis    Depression    Diabetes mellitus    Diverticulitis    Fibromyalgia    Hepatitis 08/28/2014   Hypertension    Myalgia and myositis 03/28/2012   Past Surgical History:  Procedure Laterality Date   BUNIONECTOMY     CAROTID PTA/STENT INTERVENTION Left 08/07/2020   Procedure: CAROTID PTA/STENT INTERVENTION;  Surgeon: Annice Needy, MD;  Location: [facility] INVASIVE [facility];  Service: Cardiovascular;  Laterality: Left;   CESAREAN SECTION     COLONOSCOPY     COLONOSCOPY WITH ESOPHAGOGASTRODUODENOSCOPY (EGD)     COLONOSCOPY WITH PROPOFOL N/A 02/18/2021   Procedure: COLONOSCOPY WITH PROPOFOL;  Surgeon: Toledo, Boykin Nearing, MD;  Location: [facility] ENDOSCOPY;  Service: Gastroenterology;  Laterality: N/A;   ESOPHAGOGASTRODUODENOSCOPY (EGD) WITH PROPOFOL N/A 02/18/2021   Procedure: ESOPHAGOGASTRODUODENOSCOPY (EGD) WITH PROPOFOL;  Surgeon: Toledo, Boykin Nearing, MD;  Location: [facility] ENDOSCOPY;  Service: Gastroenterology;  Laterality: N/A;   TONSILLECTOMY     Social History:  reports that she has been smoking cigarettes and e-cigarettes. She has never used smokeless tobacco. She reports that she  does not currently use alcohol. She reports that she does not use drugs.  Allergies  Allergen Reactions   Latex Hives   Lipitor [Atorvastatin Calcium] Other (See Comments)    Muscle aches   Codeine Rash    And nausea     Pentazocine Lactate Rash   Sulfonamide Derivatives Rash    Family History  Problem Relation Age of Onset   Cancer Mother    Heart disease Father    Heart disease Sister     Prior to Admission medications   Medication Sig Start Date End Date Taking? Authorizing Provider  amphetamine-dextroamphetamine (ADDERALL XR) 20 MG 24 hr capsule Take 40 mg by mouth every morning. 06/29/23   [provider]  aspirin EC 81 MG EC tablet Take 1 tablet (81 mg total) by mouth daily. Swallow whole. 08/08/20   Stegmayer, Ranae Plumber, PA-C  BYSTOLIC 5 MG tablet TAKE 1 TABLET DAILY 09/26/15   Carlus Pavlov, MD  clopidogrel (PLAVIX) 75 MG tablet TAKE 1 TABLET BY MOUTH EVERY DAY 07/18/23   Georgiana Spinner, NP  diphenoxylate-atropine (LOMOTIL) 2.5-0.025 MG tablet Take 1 tablet by mouth 4 (four) times daily as needed for diarrhea or loose stools.  07/08/10   [provider]  doxycycline (VIBRA-TABS) 100 MG tablet Take 1 tablet (100 mg total) by mouth 2 (two) times daily. Patient not taking: Reported on 07/26/2023 07/15/22   Edwin Cap, DPM  Dulaglutide (TRULICITY) 3 MG/0.5ML SOPN Inject 3 mg into the skin once a week. 06/23/23     Dulaglutide 0.75 MG/0.5ML SOPN Inject 3 mg into the skin once a week.     [provider]  empagliflozin (JARDIANCE) 25 MG TABS tablet Take 25 mg by mouth daily.    [provider]  EPINEPHrine 0.3 mg/0.3 mL IJ SOAJ injection Inject into the muscle.    [provider]  glucose blood (BAYER CONTOUR NEXT TEST) test strip Use to test blood sugar 3 times daily as instructed. Dx code: E11.65 02/10/15   Carlus Pavlov, MD  Lancets (ACCU-CHEK MULTICLIX) lancets Use to test blood sugar 3 times daily as instructed. Dx: E11.65 02/10/15   Carlus Pavlov, MD  lisdexamfetamine (VYVANSE) 50 MG capsule Take 50 mg by mouth daily.    [provider]  lisinopril (PRINIVIL,ZESTRIL) 5 MG tablet TAKE 1 TABLET BY MOUTH EVERY DAY 07/14/15   Carlus Pavlov, MD  LORazepam (ATIVAN) 0.5 MG tablet Take 1 tablet (0.5 mg total) by mouth daily as needed for anxiety. 09/16/14   Doris Cheadle, MD  metFORMIN (GLUCOPHAGE) 1000 MG tablet SMARTSIG:1 By Mouth 10/19/21   [provider]  mupirocin ointment (BACTROBAN) 2 % Apply 1 Application topically 2 (two) times daily. 07/15/22   Edwin Cap, DPM  omeprazole (PRILOSEC) 40 MG capsule TK 1 C PO D 08/30/18   [provider]  pregabalin (LYRICA) 300 MG capsule Take 300 mg by mouth 2 (two) times daily. 08/05/20   [provider]  rosuvastatin (CRESTOR) 5 MG tablet Take by mouth. 07/26/18   [provider]  sitaGLIPtin (JANUVIA) 100 MG tablet TAKE 1 TABLET BY MOUTH EVERY DAY. Patient not taking: Reported on 07/26/2023 10/02/18   [provider]  traMADol (ULTRAM) 50 MG tablet TAKE 1 TABLET(50 MG) BY MOUTH EVERY 6 HOURS FOR UP TO 5 DAYS AS NEEDED 05/04/22   Edwin Cap, DPM  tretinoin (RETIN-A) 0.1 % cream SMARTSIG:sparingly Topical Every Night 08/26/22   [provider]  Vitamin D, Ergocalciferol, (DRISDOL) 1.25  MG (50000 UNIT) CAPS capsule TAKE 1 CAPSULE BY MOUTH EVERY 7 DAYS FOR 8 DOSES Patient not taking: Reported on 07/26/2023 08/23/22   Edwin Cap, DPM    Physical Exam: Vitals:   08/07/23 0831 08/07/23 0840 08/07/23 0900 08/07/23 1210  BP: (!) 92/50  (!) 113/52 (!) 129/53  Pulse: 85  (!) 53 (!) 59  Resp: 15  16 18   Temp: 97.8 F (36.6 C)   98 F (36.7 C)  TempSrc: Oral     SpO2: 100%  97% 100%  Weight:  68 kg    Height:  5\' 3"  (1.6 m)     Physical Exam Vitals and nursing note reviewed.  Constitutional:      General: She is awake. She is not in acute distress.    Appearance: Normal appearance. She is overweight.  HENT:     Head: Normocephalic.     Nose: No rhinorrhea.     Mouth/Throat:     Mouth: Mucous membranes are moist.  Eyes:     General: No scleral icterus.    Pupils: Pupils are equal, round, and reactive to light.   Neck:     Vascular: No JVD.  Cardiovascular:     Rate and Rhythm: Normal rate and regular rhythm.     Heart sounds: S1 normal and S2 normal.  Pulmonary:     Effort: Pulmonary effort is normal.     Breath sounds: Normal breath sounds. No wheezing, rhonchi or rales.  Abdominal:     General: Bowel sounds are normal. There is no distension.     Palpations: Abdomen is soft.     Tenderness: There is no abdominal tenderness.  Musculoskeletal:     Cervical back: Neck supple.     Right lower leg: No edema.     Left lower leg: No edema.  Skin:    General: Skin is warm and dry.     Coloration: Skin is pale.  Neurological:     General: No focal deficit present.     Mental Status: She is alert and oriented to person, place, and time.  Psychiatric:        Mood and Affect: Mood normal.        Behavior: Behavior normal. Behavior is cooperative.    Data Reviewed:  Results are pending, will review when available.  Assessment and Plan: Principal Problem:   Acute blood loss anemia (ABLA) In the setting of:   Lower GI bleed Admit to stepdown/inpatient. Keep NPO for now. Continue IV fluids. Continue pantoprazole 40 mg IVP daily.. Monitor H&H. Transfuse as needed. GI consult greatly appreciated. -Will follow the recommendations.  Active Problems:   Hyperkalemia Holding lisinopril. Received IV fluids earlier Follow potassium level.    Hypocalcemia Repeat calcium with albumin level in AM. Further workup depending on results.    Mild protein malnutrition (HCC) In the setting of acute blood loss. Would recheck 48 to 72 hours after bleeding subsided.    Essential hypertension BP has been low or soft normal. Hold antihypertensives for now.    Diabetes mellitus, type 2 (HCC) Currently NPO. CBG monitoring every 6 hours.    Hyperlipidemia Resume rosuvastatin after med rec performed.     Advance Care Planning:   Code Status: Full Code   Consults: [facility] Charlott Rakes,  MD).  Family Communication:   Severity of Illness: The appropriate patient status for this patient is OBSERVATION. Observation status is judged to be reasonable and necessary in order to provide the  required intensity of service to ensure the patient's safety. The patient's presenting symptoms, physical exam findings, and initial radiographic and laboratory data in the context of their medical condition is felt to place them at decreased risk for further clinical deterioration. Furthermore, it is anticipated that the patient will be medically stable for discharge from the hospital within 2 midnights of admission.   Author: Bobette Mo, MD 08/07/2023 12:21 PM  For on call review [email or web address].   This document was prepared using Dragon voice recognition software and may contain some unintended transcription errors.

## 2023-08-07 NOTE — Consult Note (Signed)
Referring Provider: Dr. Robb Matar Primary Care Physician:  Marguarite Arbour, MD Primary Gastroenterologist:  Gentry Fitz  Reason for Consultation:  Rectal bleeding  HPI: [name] is a 77 y.o. female with acute onset of bright red blood per rectum that started this morning and has occurred several times. Denies any associated abdominal pain, N/V, dizziness, or lightheadedness. She has had BRBPR in the ER as well and feels like she needs to go again. Reports that her abdomen is gurgling. Colonoscopy in March 2022 showed left-sided diverticulosis, internal hemorrhoids, and 2 small polyps were removed (Dr. Norma Fredrickson at [facility]). Hgb 9.3. History of alcohol abuse until 8 years ago. Took an NSAID today but otherwise denies NSAID use. On Plavix. Husband in room.  Past Medical History:  Diagnosis Date   Alcohol abuse    CAP (community acquired pneumonia) 05/05/2018   Carotid artery stenosis    Depression    Diabetes mellitus    Diverticulitis    Essential hypertension 08/20/2007   Qualifier: Diagnosis of   By: Jonny Ruiz MD, Len Blalock        Fibromyalgia    Hepatitis 08/28/2014   Hyperlipidemia 08/20/2007   Qualifier: Diagnosis of   By: Jonny Ruiz MD, Len Blalock        Hypertension    Myalgia and myositis 03/28/2012    Past Surgical History:  Procedure Laterality Date   BUNIONECTOMY     CAROTID PTA/STENT INTERVENTION Left 08/07/2020   Procedure: CAROTID PTA/STENT INTERVENTION;  Surgeon: Annice Needy, MD;  Location: [facility];  Service: Cardiovascular;  Laterality: Left;   CESAREAN SECTION     COLONOSCOPY     COLONOSCOPY WITH ESOPHAGOGASTRODUODENOSCOPY (EGD)     COLONOSCOPY WITH PROPOFOL N/A 02/18/2021   Procedure: COLONOSCOPY WITH PROPOFOL;  Surgeon: Toledo, Boykin Nearing, MD;  Location: [facility];  Service: Gastroenterology;  Laterality: N/A;   ESOPHAGOGASTRODUODENOSCOPY (EGD) WITH PROPOFOL N/A 02/18/2021   Procedure: ESOPHAGOGASTRODUODENOSCOPY (EGD) WITH PROPOFOL;  Surgeon: Toledo, Boykin Nearing, MD;   Location: [facility] ENDOS[facility];  Service: Gastroenterology;  Laterality: N/A;   TONSILLECTOMY      Prior to Admission medications   Medication Sig Start Date End Date Taking? Authorizing Provider  amphetamine-dextroamphetamine (ADDERALL XR) 20 MG 24 hr capsule Take 40 mg by mouth every morning. 06/29/23   [provider]  aspirin EC 81 MG EC tablet Take 1 tablet (81 mg total) by mouth daily. Swallow whole. 08/08/20   Stegmayer, Ranae Plumber, PA-C  BYSTOLIC 5 MG tablet TAKE 1 TABLET DAILY 09/26/15   Carlus Pavlov, MD  clopidogrel (PLAVIX) 75 MG tablet TAKE 1 TABLET BY MOUTH EVERY DAY 07/18/23   Georgiana Spinner, NP  diphenoxylate-atropine (LOMOTIL) 2.5-0.025 MG tablet Take 1 tablet by mouth 4 (four) times daily as needed for diarrhea or loose stools.  07/08/10   [provider]  doxycycline (VIBRA-TABS) 100 MG tablet Take 1 tablet (100 mg total) by mouth 2 (two) times daily. Patient not taking: Reported on 07/26/2023 07/15/22   Edwin Cap, DPM  Dulaglutide (TRULICITY) 3 MG/0.5ML SOPN Inject 3 mg into the skin once a week. 06/23/23     Dulaglutide 0.75 MG/0.5ML SOPN Inject 3 mg into the skin once a week.     [provider]  empagliflozin (JARDIANCE) 25 MG TABS tablet Take 25 mg by mouth daily.    [provider]  EPINEPHrine 0.3 mg/0.3 mL IJ SOAJ injection Inject into the muscle.    [provider]  glucose blood (BAYER CONTOUR NEXT TEST)  test strip Use to test blood sugar 3 times daily as instructed. Dx code: E11.65 02/10/15   Carlus Pavlov, MD  Lancets (ACCU-CHEK MULTICLIX) lancets Use to test blood sugar 3 times daily as instructed. Dx: E11.65 02/10/15   Carlus Pavlov, MD  lisdexamfetamine (VYVANSE) 50 MG capsule Take 50 mg by mouth daily.    [provider]  lisinopril (PRINIVIL,ZESTRIL) 5 MG tablet TAKE 1 TABLET BY MOUTH EVERY DAY 07/14/15   Carlus Pavlov, MD  LORazepam (ATIVAN) 0.5 MG tablet Take 1 tablet (0.5 mg total) by mouth daily as  needed for anxiety. 09/16/14   Doris Cheadle, MD  metFORMIN (GLUCOPHAGE) 1000 MG tablet SMARTSIG:1 By Mouth 10/19/21   [provider]  mupirocin ointment (BACTROBAN) 2 % Apply 1 Application topically 2 (two) times daily. 07/15/22   Edwin Cap, DPM  omeprazole (PRILOSEC) 40 MG capsule TK 1 C PO D 08/30/18   [provider]  pregabalin (LYRICA) 300 MG capsule Take 300 mg by mouth 2 (two) times daily. 08/05/20   [provider]  rosuvastatin (CRESTOR) 5 MG tablet Take by mouth. 07/26/18   [provider]  sitaGLIPtin (JANUVIA) 100 MG tablet TAKE 1 TABLET BY MOUTH EVERY DAY. Patient not taking: Reported on 07/26/2023 10/02/18   [provider]  traMADol (ULTRAM) 50 MG tablet TAKE 1 TABLET(50 MG) BY MOUTH EVERY 6 HOURS FOR UP TO 5 DAYS AS NEEDED 05/04/22   Edwin Cap, DPM  tretinoin (RETIN-A) 0.1 % cream SMARTSIG:sparingly Topical Every Night 08/26/22   [provider]  Vitamin D, Ergocalciferol, (DRISDOL) 1.25 MG (50000 UNIT) CAPS capsule TAKE 1 CAPSULE BY MOUTH EVERY 7 DAYS FOR 8 DOSES Patient not taking: Reported on 07/26/2023 08/23/22   Edwin Cap, DPM    Scheduled Meds:  pantoprazole (PROTONIX) IV  40 mg Intravenous Q24H   Continuous Infusions:  sodium chloride 100 mL/hr at 08/07/23 1342   PRN Meds:.acetaminophen **OR** acetaminophen, ondansetron **OR** ondansetron (ZOFRAN) IV  Allergies as of 08/07/2023 - Review Complete 08/07/2023  Allergen Reaction Noted   Latex Hives 11/10/2011   Lipitor [atorvastatin calcium] Other (See Comments) 03/12/2012   Codeine Rash 08/20/2007   Pentazocine lactate Rash 08/20/2007   Sulfonamide derivatives Rash 08/20/2007    Family History  Problem Relation Age of Onset   Cancer Mother    Heart disease Father    Heart disease Sister     Social History   Socioeconomic History   Marital status: Married    Spouse name: Not on file   Number of children: Not on file   Years of education:  Not on file   Highest education level: Not on file  Occupational History   Not on file  Tobacco Use   Smoking status: Every Day    Current packs/day: 0.00    Types: Cigarettes, E-cigarettes    Last attempt to quit: 03/11/2010    Years since quitting: 13.4   Smokeless tobacco: Never  Vaping Use   Vaping status: Former  Substance and Sexual Activity   Alcohol use: Not Currently   Drug use: No    Comment: Pt denies    Sexual activity: Never  Other Topics Concern   Not on file  Social History Narrative   Not on file   Social Determinants of Health   Financial Resource Strain: Not on file  Food Insecurity: Not on file  Transportation Needs: Not on file  Physical Activity: Not on file  Stress: Not on file  Social Connections:  Not on file  Intimate Partner Violence: Not on file    Review of Systems: All negative except as stated above in HPI.  Physical Exam: Vital signs: Vitals:   08/07/23 1210 08/07/23 1345  BP: (!) 129/53 (!) 96/43  Pulse: (!) 59 64  Resp: 18 20  Temp: 98 F (36.7 C) 98.1 F (36.7 C)  SpO2: 100% 97%     General:   Lethargic, well-nourished, no acute distress  Head: normocephalic, atraumatic Eyes: anicteric sclera ENT: oropharynx clear Neck: supple, nontender Lungs:  Clear throughout to auscultation.   No wheezes, crackles, or rhonchi. No acute distress. Heart:  Regular rate and rhythm; no murmurs, clicks, rubs,  or gallops. Abdomen: soft, nontender, nondistended, +BS  Rectal:  Deferred Ext: no edema  GI:  Lab Results: Recent Labs    08/07/23 0919  WBC 9.2  HGB 9.3*  HCT 31.5*  PLT 268   BMET Recent Labs    08/07/23 0919  NA 136  K 5.2*  CL 105  CO2 23  GLUCOSE 163*  BUN 36*  CREATININE 1.06*  CALCIUM 8.0*   LFT Recent Labs    08/07/23 0919  PROT 6.2*  ALBUMIN 3.3*  AST 26  ALT 11  ALKPHOS 60  BILITOT 0.6   PT/INR Recent Labs    08/07/23 0944  LABPROT 12.7  INR 0.9     Studies/Results: CT ANGIO GI  BLEED  Result Date: 08/07/2023 CLINICAL DATA:  Lower GI bleed EXAM: CTA ABDOMEN AND PELVIS WITHOUT AND WITH CONTRAST TECHNIQUE: Multidetector CT imaging of the abdomen and pelvis was performed using the standard protocol during bolus administration of intravenous contrast. Multiplanar reconstructed images and MIPs were obtained and reviewed to evaluate the vascular anatomy. RADIATION DOSE REDUCTION: This exam was performed according to the departmental dose-optimization program which includes automated exposure control, adjustment of the mA and/or kV according to patient size and/or use of iterative reconstruction technique. CONTRAST:  OMNIPAQUE IOHEXOL 350 MG/ML SOLN COMPARISON:  Abdominal ultrasound 01/24/2016. CT of the abdomen and pelvis with contrast 08/28/2014. FINDINGS: VASCULAR Aorta: Diffuse atherosclerotic changes are present throughout the abdominal aorta. No aneurysm or focal stenosis is present. 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 The lumen is narrowed to 1 mm compared with a more normal distal segment at 5.7 mm. The more distal branch vessels are within normal limits. SMA: Atherosclerotic calcifications are present at the ostium. The lumen slightly more distal is narrowed to 2 mm. This compares with 5.7 mm slightly more distal. The branch vessels are within normal limits. Renals: Atherosclerotic changes are present at the ostium of the right renal artery. A 50% stenosis is present. High-grade stenosis is present in the left lower pole renal artery. The upper pole renal artery is unremarkable. IMA: A high-grade stenosis is present at the ostium of the IMA. More distal branch vessels are within normal limits. Inflow: Atherosclerotic calcifications scratched at atherosclerotic changes are present in the proximal iliac arteries bilaterally without a significant stenosis. Proximal Outflow: Bilateral common femoral and visualized portions of the superficial and  profunda femoral arteries are patent without evidence of aneurysm, dissection, vasculitis or significant stenosis. Veins: No obvious venous abnormality within the limitations of this arterial phase study. Review of the MIP images confirms the above findings. NON-VASCULAR Lower chest: Mild dependent fibrotic change or scarring is present. The lungs are otherwise clear. The heart size is normal. No significant pleural or pericardial effusion is present. Hepatobiliary: No focal liver abnormality  is seen. No gallstones, gallbladder wall thickening, or biliary dilatation. Pancreas: Unremarkable. No pancreatic ductal dilatation or surrounding inflammatory changes. Spleen: Normal in size without focal abnormality. Adrenals/Urinary Tract: Adrenal glands are normal bilaterally. A simple cyst anteriorly in the right kidney measures 16 mm. No other focal lesions are present. No stone or obstruction is present. Ureters are within normal limits bilaterally. Mild diffuse wall thickening is present in the urinary bladder. No discrete lesions are present. Stomach/Bowel: The stomach and duodenum are within normal limits. Small bowel is unremarkable. Terminal ileum is within normal limits. The appendix is visualized and normal. The ascending and transverse colon are within normal limits. The descending and sigmoid colon are unremarkable. Lymphatic: No significant abdominal adenopathy is present. Reproductive: Uterus and bilateral adnexa are unremarkable. Other: No abdominal wall hernia or abnormality. No abdominopelvic ascites. Musculoskeletal: Degenerative retrolisthesis at L1-2 measures 6 mm. No other significant listhesis is present. Bony pelvis is normal. The hips are located and within normal limits bilaterally. IMPRESSION: 1. No acute or focal lesion to explain the patient's lower GI bleed. No arterial enhancement within the bowel. 2. 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 3. Moderate stenosis of the proximal SMA.  4. High-grade stenosis of the IMA. 5. 50% stenosis of the right renal artery. 6. High-grade stenosis of the left lower pole renal artery. 7. 16 mm simple cyst in the right kidney. No follow-up imaging is recommended. JACR 2018 Feb; 264-273, Management of the Incidental Renal Mass on CT, [employer] 2021; [id number], Bosniak Classification of Cystic Renal Masses, Version 2019. 8. Degenerative retrolisthesis at L1-2 measures 6 mm. 9. Mild diffuse wall thickening in the urinary bladder is nonspecific, but can be seen in the setting of cystitis. 10.  Aortic Atherosclerosis (ICD10-I70.0). Electronically Signed   By: Marin Roberts M.D.   On: 08/07/2023 11:27    Impression/Plan: Painless hematochezia likely due to diverticular bleeding - CT angio negative for active GI bleeding. Hold Plavix. NPO except ice chips. Follow H/H. May need repeat CTA in bleeding worsens. I do not think an updated colonoscopy is needed but if bleeding does not resolve in next 36 hours then will need to revisit this question. Supportive care. Dr. Lorenso Quarry from [facility] will f/u tomorrow.    LOS: 0 days   Shirley Friar  08/07/2023, 1:50 PM  Questions please call [phone]

## 2023-08-07 NOTE — Progress Notes (Signed)
Peripherally Inserted Central Catheter Placement  The IV Nurse has discussed with the patient and/or persons authorized to consent for the patient, the purpose of this procedure and the potential benefits and risks involved with this procedure.  The benefits include less needle sticks, lab draws from the catheter, and the patient may be discharged home with the catheter. Risks include, but not limited to, infection, bleeding, blood clot (thrombus formation), and puncture of an artery; nerve damage and irregular heartbeat and possibility to perform a PICC exchange if needed/ordered by physician.  Alternatives to this procedure were also discussed.  Bard Power PICC patient education guide, fact sheet on infection prevention and patient information card has been provided to patient /or left at bedside.    PICC Placement Documentation  PICC Double Lumen 08/07/23 Right Basilic 38 cm 0 cm (Active)  Indication for Insertion or Continuance of Line Poor Vasculature-patient has had multiple peripheral attempts or PIVs lasting less than 24 hours 08/07/23 2300  Exposed Catheter (cm) 0 cm 08/07/23 2300  Site Assessment Clean, Dry, Intact 08/07/23 2300  Lumen #1 Status Flushed;Saline locked;Blood return noted 08/07/23 2300  Lumen #2 Status Flushed;Saline locked;Blood return noted 08/07/23 2300  Dressing Type Transparent;Securing device 08/07/23 2300  Dressing Status Antimicrobial disc in place 08/07/23 2300  Line Care Connections checked and tightened 08/07/23 2300  Line Adjustment (NICU/IV Team Only) No 08/07/23 2300  Dressing Intervention New dressing 08/07/23 2300  Dressing Change Due 08/14/23 08/07/23 2300       Vernona Rieger  Lauri Till 08/07/2023, 11:05 PM

## 2023-08-08 ENCOUNTER — Inpatient Hospital Stay (HOSPITAL_COMMUNITY): Payer: Medicare HMO

## 2023-08-08 ENCOUNTER — Encounter (HOSPITAL_COMMUNITY): Payer: Self-pay

## 2023-08-08 DIAGNOSIS — I10 Essential (primary) hypertension: Secondary | ICD-10-CM | POA: Diagnosis present

## 2023-08-08 DIAGNOSIS — F419 Anxiety disorder, unspecified: Secondary | ICD-10-CM | POA: Diagnosis present

## 2023-08-08 DIAGNOSIS — D62 Acute posthemorrhagic anemia: Secondary | ICD-10-CM | POA: Diagnosis present

## 2023-08-08 DIAGNOSIS — E875 Hyperkalemia: Secondary | ICD-10-CM | POA: Diagnosis present

## 2023-08-08 DIAGNOSIS — E441 Mild protein-calorie malnutrition: Secondary | ICD-10-CM | POA: Diagnosis present

## 2023-08-08 DIAGNOSIS — F1011 Alcohol abuse, in remission: Secondary | ICD-10-CM | POA: Diagnosis present

## 2023-08-08 DIAGNOSIS — K922 Gastrointestinal hemorrhage, unspecified: Secondary | ICD-10-CM | POA: Diagnosis present

## 2023-08-08 DIAGNOSIS — Z888 Allergy status to other drugs, medicaments and biological substances status: Secondary | ICD-10-CM | POA: Diagnosis not present

## 2023-08-08 DIAGNOSIS — M797 Fibromyalgia: Secondary | ICD-10-CM | POA: Diagnosis present

## 2023-08-08 DIAGNOSIS — Z7984 Long term (current) use of oral hypoglycemic drugs: Secondary | ICD-10-CM | POA: Diagnosis not present

## 2023-08-08 DIAGNOSIS — Z8249 Family history of ischemic heart disease and other diseases of the circulatory system: Secondary | ICD-10-CM | POA: Diagnosis not present

## 2023-08-08 DIAGNOSIS — N179 Acute kidney failure, unspecified: Secondary | ICD-10-CM | POA: Diagnosis present

## 2023-08-08 DIAGNOSIS — E1151 Type 2 diabetes mellitus with diabetic peripheral angiopathy without gangrene: Secondary | ICD-10-CM | POA: Diagnosis present

## 2023-08-08 DIAGNOSIS — F909 Attention-deficit hyperactivity disorder, unspecified type: Secondary | ICD-10-CM | POA: Diagnosis present

## 2023-08-08 DIAGNOSIS — R578 Other shock: Secondary | ICD-10-CM | POA: Diagnosis present

## 2023-08-08 DIAGNOSIS — R451 Restlessness and agitation: Secondary | ICD-10-CM | POA: Diagnosis not present

## 2023-08-08 DIAGNOSIS — K5731 Diverticulosis of large intestine without perforation or abscess with bleeding: Secondary | ICD-10-CM | POA: Diagnosis present

## 2023-08-08 DIAGNOSIS — F17210 Nicotine dependence, cigarettes, uncomplicated: Secondary | ICD-10-CM | POA: Diagnosis present

## 2023-08-08 DIAGNOSIS — E785 Hyperlipidemia, unspecified: Secondary | ICD-10-CM | POA: Diagnosis present

## 2023-08-08 DIAGNOSIS — Z885 Allergy status to narcotic agent status: Secondary | ICD-10-CM | POA: Diagnosis not present

## 2023-08-08 DIAGNOSIS — Z91040 Latex allergy status: Secondary | ICD-10-CM | POA: Diagnosis not present

## 2023-08-08 DIAGNOSIS — F17290 Nicotine dependence, other tobacco product, uncomplicated: Secondary | ICD-10-CM | POA: Diagnosis present

## 2023-08-08 DIAGNOSIS — F32A Depression, unspecified: Secondary | ICD-10-CM | POA: Diagnosis present

## 2023-08-08 LAB — TYPE AND SCREEN
ABO/RH(D): A POS
ABO/RH(D): A POS
Antibody Screen: NEGATIVE
Antibody Screen: NEGATIVE
Unit division: 0

## 2023-08-08 LAB — CBC WITH DIFFERENTIAL/PLATELET
Abs Immature Granulocytes: 0.05 10*3/uL (ref 0.00–0.07)
Basophils Absolute: 0.1 10*3/uL (ref 0.0–0.1)
Basophils Relative: 1 %
Eosinophils Absolute: 0.2 10*3/uL (ref 0.0–0.5)
Eosinophils Relative: 1 %
HCT: 21.1 % — ABNORMAL LOW (ref 36.0–46.0)
Hemoglobin: 6.8 g/dL — CL (ref 12.0–15.0)
Immature Granulocytes: 0 %
Lymphocytes Relative: 17 %
Lymphs Abs: 2.3 10*3/uL (ref 0.7–4.0)
MCH: 27.3 pg (ref 26.0–34.0)
MCHC: 32.2 g/dL (ref 30.0–36.0)
MCV: 84.7 fL (ref 80.0–100.0)
Monocytes Absolute: 0.8 10*3/uL (ref 0.1–1.0)
Monocytes Relative: 6 %
Neutro Abs: 10.1 10*3/uL — ABNORMAL HIGH (ref 1.7–7.7)
Neutrophils Relative %: 75 %
Platelets: 201 10*3/uL (ref 150–400)
RBC: 2.49 MIL/uL — ABNORMAL LOW (ref 3.87–5.11)
RDW: 16.8 % — ABNORMAL HIGH (ref 11.5–15.5)
WBC: 13.4 10*3/uL — ABNORMAL HIGH (ref 4.0–10.5)
nRBC: 0 % (ref 0.0–0.2)

## 2023-08-08 LAB — HEMOGLOBIN AND HEMATOCRIT, BLOOD
HCT: 24.1 % — ABNORMAL LOW (ref 36.0–46.0)
Hemoglobin: 7.7 g/dL — ABNORMAL LOW (ref 12.0–15.0)

## 2023-08-08 LAB — BPAM RBC
Blood Product Expiration Date: 202409132359
ISSUE DATE / TIME: 202408251820
Unit Type and Rh: 9500

## 2023-08-08 LAB — CBC
HCT: 24.8 % — ABNORMAL LOW (ref 36.0–46.0)
HCT: 22 % — ABNORMAL LOW (ref 36.0–46.0)
Hemoglobin: 8.2 g/dL — ABNORMAL LOW (ref 12.0–15.0)
Hemoglobin: 7.1 g/dL — ABNORMAL LOW (ref 12.0–15.0)
MCH: 28.2 pg (ref 26.0–34.0)
MCH: 27.7 pg (ref 26.0–34.0)
MCHC: 33.1 g/dL (ref 30.0–36.0)
MCHC: 32.3 g/dL (ref 30.0–36.0)
MCV: 85.2 fL (ref 80.0–100.0)
MCV: 85.9 fL (ref 80.0–100.0)
Platelets: 159 10*3/uL (ref 150–400)
Platelets: 152 10*3/uL (ref 150–400)
RBC: 2.91 MIL/uL — ABNORMAL LOW (ref 3.87–5.11)
RBC: 2.56 MIL/uL — ABNORMAL LOW (ref 3.87–5.11)
RDW: 16.3 % — ABNORMAL HIGH (ref 11.5–15.5)
RDW: 16.5 % — ABNORMAL HIGH (ref 11.5–15.5)
WBC: 9.9 10*3/uL (ref 4.0–10.5)
WBC: 10.4 10*3/uL (ref 4.0–10.5)
nRBC: 0 % (ref 0.0–0.2)
nRBC: 0 % (ref 0.0–0.2)

## 2023-08-08 LAB — COMPREHENSIVE METABOLIC PANEL
ALT: 12 U/L (ref 0–44)
AST: 12 U/L — ABNORMAL LOW (ref 15–41)
Albumin: 2.3 g/dL — ABNORMAL LOW (ref 3.5–5.0)
Alkaline Phosphatase: 37 U/L — ABNORMAL LOW (ref 38–126)
Anion gap: 3 — ABNORMAL LOW (ref 5–15)
BUN: 26 mg/dL — ABNORMAL HIGH (ref 8–23)
CO2: 20 mmol/L — ABNORMAL LOW (ref 22–32)
Calcium: 6.7 mg/dL — ABNORMAL LOW (ref 8.9–10.3)
Chloride: 114 mmol/L — ABNORMAL HIGH (ref 98–111)
Creatinine, Ser: 0.78 mg/dL (ref 0.44–1.00)
GFR, Estimated: >60 mL/min (ref >60)
Glucose, Bld: 163 mg/dL — ABNORMAL HIGH (ref 70–99)
Potassium: 3.9 mmol/L (ref 3.5–5.1)
Sodium: 137 mmol/L (ref 135–145)
Total Bilirubin: 0.6 mg/dL (ref 0.3–1.2)
Total Protein: 4 g/dL — ABNORMAL LOW (ref 6.5–8.1)

## 2023-08-08 LAB — PREPARE RBC (CROSSMATCH)

## 2023-08-08 LAB — BASIC METABOLIC PANEL
Anion gap: 11 (ref 5–15)
BUN: 14 mg/dL (ref 8–23)
CO2: 20 mmol/L — ABNORMAL LOW (ref 22–32)
Calcium: 7.7 mg/dL — ABNORMAL LOW (ref 8.9–10.3)
Chloride: 108 mmol/L (ref 98–111)
Creatinine, Ser: 0.66 mg/dL (ref 0.44–1.00)
GFR, Estimated: >60 mL/min (ref >60)
Glucose, Bld: 124 mg/dL — ABNORMAL HIGH (ref 70–99)
Potassium: 3.7 mmol/L (ref 3.5–5.1)
Sodium: 139 mmol/L (ref 135–145)

## 2023-08-08 LAB — PHOSPHORUS: Phosphorus: 3.8 mg/dL (ref 2.5–4.6)

## 2023-08-08 LAB — MAGNESIUM: Magnesium: 1.6 mg/dL — ABNORMAL LOW (ref 1.7–2.4)

## 2023-08-08 MED ORDER — PREGABALIN 50 MG PO CAPS
100.0000 mg | ORAL_CAPSULE | Freq: Two times a day (BID) | ORAL | Status: DC
  Administered 2023-08-09 – 2023-08-11 (×6): 100 mg via ORAL
  Filled 2023-08-08: qty 2
  Filled 2023-08-08: qty 1
  Filled 2023-08-08: qty 2
  Filled 2023-08-08 (×3): qty 1

## 2023-08-08 MED ORDER — MAGNESIUM SULFATE 4 GM/100ML IV SOLN
4.0000 g | Freq: Once | INTRAVENOUS | Status: AC
  Administered 2023-08-08: 4 g via INTRAVENOUS
  Filled 2023-08-08: qty 100

## 2023-08-08 MED ORDER — CALCIUM GLUCONATE-NACL 2-0.675 GM/100ML-% IV SOLN
2.0000 g | Freq: Once | INTRAVENOUS | Status: AC
  Administered 2023-08-08: 2000 mg via INTRAVENOUS
  Filled 2023-08-08: qty 100

## 2023-08-08 MED ORDER — SODIUM CHLORIDE 0.9% IV SOLUTION: Freq: Once | INTRAVENOUS | Status: DC

## 2023-08-08 MED ORDER — MAGNESIUM SULFATE 2 GM/50ML IV SOLN
2.0000 g | Freq: Once | INTRAVENOUS | Status: AC
  Administered 2023-08-08: 2 g via INTRAVENOUS
  Filled 2023-08-08: qty 50

## 2023-08-08 MED ORDER — LORAZEPAM 0.5 MG PO TABS
0.5000 mg | ORAL_TABLET | Freq: Every day | ORAL | Status: DC | PRN
  Administered 2023-08-08 – 2023-08-10 (×3): 0.5 mg via ORAL
  Filled 2023-08-08 (×3): qty 1

## 2023-08-08 MED ORDER — POTASSIUM CHLORIDE CRYS ER 20 MEQ PO TBCR
40.0000 meq | EXTENDED_RELEASE_TABLET | Freq: Once | ORAL | Status: AC
  Administered 2023-08-08: 40 meq via ORAL
  Filled 2023-08-08: qty 2

## 2023-08-08 MED ORDER — IOHEXOL 350 MG/ML SOLN
100.0000 mL | Freq: Once | INTRAVENOUS | Status: AC | PRN
  Administered 2023-08-08: 100 mL via INTRAVENOUS

## 2023-08-08 MED ORDER — LACTATED RINGERS IV SOLN: INTRAVENOUS | Status: AC

## 2023-08-08 MED ORDER — NOREPINEPHRINE 4 MG/250ML-% IV SOLN: 0.0000 ug/min | INTRAVENOUS | Status: DC

## 2023-08-08 NOTE — Progress Notes (Signed)
[facility] Progress Note  SUBJECTIVE:   Interval history: [name] was seen and evaluated today at bedside. No bleeding per rectum since shift change last night per nursing staff. Patient denied any rectal bleeding today. CTA today negative for signs of active GI extravasation/bleeding. Patient accompanied in room by spouse. She denied any abdominal pain, nausea, vomiting, chest pain, shortness of breath.  Past Medical History:  Diagnosis Date   Alcohol abuse    CAP (community acquired pneumonia) 05/05/2018   Carotid artery stenosis    Depression    Diabetes mellitus    Diverticulitis    Essential hypertension 08/20/2007   Qualifier: Diagnosis of   By: Jonny Ruiz MD, Len Blalock        Fibromyalgia    Hepatitis 08/28/2014   Hyperlipidemia 08/20/2007   Qualifier: Diagnosis of   By: Jonny Ruiz MD, Len Blalock        Hypertension    Myalgia and myositis 03/28/2012   Past Surgical History:  Procedure Laterality Date   BUNIONECTOMY     CAROTID PTA/STENT INTERVENTION Left 08/07/2020   Procedure: CAROTID PTA/STENT INTERVENTION;  Surgeon: Annice Needy, MD;  Location: [facility] INVASIVE CV LAB;  Service: Cardiovascular;  Laterality: Left;   CESAREAN SECTION     COLONOSCOPY     COLONOSCOPY WITH ESOPHAGOGASTRODUODENOSCOPY (EGD)     COLONOSCOPY WITH PROPOFOL N/A 02/18/2021   Procedure: COLONOSCOPY WITH PROPOFOL;  Surgeon: Toledo, Boykin Nearing, MD;  Location: [facility] ENDOSCOPY;  Service: Gastroenterology;  Laterality: N/A;   ESOPHAGOGASTRODUODENOSCOPY (EGD) WITH PROPOFOL N/A 02/18/2021   Procedure: ESOPHAGOGASTRODUODENOSCOPY (EGD) WITH PROPOFOL;  Surgeon: Toledo, Boykin Nearing, MD;  Location: [facility] ENDOSCOPY;  Service: Gastroenterology;  Laterality: N/A;   TONSILLECTOMY     Current Facility-Administered Medications  Medication Dose Route Frequency Provider Last Rate Last Admin   0.9 %  sodium chloride infusion (Manually program via Guardrails IV Fluids)   Intravenous Once Conrad [place], MD   Held at 08/07/23  2129   0.9 %  sodium chloride infusion (Manually program via Guardrails IV Fluids)   Intravenous Once Conrad [place], MD   Held at 08/07/23 2129   0.9 %  sodium chloride infusion (Manually program via Guardrails IV Fluids)   Intravenous Once Conrad [place], MD   Held at 08/08/23 0809   0.9 %  sodium chloride infusion  250 mL Intravenous Continuous Paliwal, Aditya, MD   Held at 08/07/23 2128   0.9 %  sodium chloride infusion   Intra-arterial PRN Paliwal, Eliezer Lofts, MD       acetaminophen (TYLENOL) tablet 650 mg  650 mg Oral Q6H PRN Bobette Mo, MD       Or   acetaminophen (TYLENOL) suppository 650 mg  650 mg Rectal Q6H PRN Bobette Mo, MD       Chlorhexidine Gluconate Cloth 2 % PADS 6 each  6 each Topical Q0600 Conrad [place], MD   6 each at 08/07/23 2015   fentaNYL (SUBLIMAZE) injection 50 mcg  50 mcg Intravenous Q2H PRN Conrad [place], MD   50 mcg at 08/07/23 2305   lactated ringers infusion   Intravenous Continuous Bowser, Kaylyn Layer, NP 100 mL/hr at 08/08/23 1114 New Bag at 08/08/23 1114   lidocaine-prilocaine (EMLA) cream   Topical Q8H PRN Conrad [place], MD   Given at 08/07/23 2311   mupirocin ointment (BACTROBAN) 2 % 1 Application  1 Application Nasal BID Bobette Mo, MD   1 Application at 08/08/23 1101   ondansetron (ZOFRAN) tablet 4 mg  4  mg Oral Q6H PRN Bobette Mo, MD       Or   ondansetron [facility]) injection 4 mg  4 mg Intravenous Q6H PRN Bobette Mo, MD       pantoprazole (PROTONIX) injection 40 mg  40 mg Intravenous Q24H Bobette Mo, MD   40 mg at 08/08/23 1101   sodium chloride flush (NS) 0.9 % injection 10-40 mL  10-40 mL Intracatheter PRN Bobette Mo, MD       sodium chloride flush (NS) 0.9 % injection 10-40 mL  10-40 mL Intracatheter Q12H Bobette Mo, MD   30 mL at 08/08/23 1101   sodium chloride flush (NS) 0.9 % injection 10-40 mL  10-40 mL Intracatheter PRN Bobette Mo, MD       Allergies as of 08/07/2023 -  Review Complete 08/07/2023  Allergen Reaction Noted   Latex Hives 11/10/2011   Lipitor [atorvastatin calcium] Other (See Comments) 03/12/2012   Codeine Rash 08/20/2007   Pentazocine lactate Rash 08/20/2007   Sulfonamide derivatives Rash 08/20/2007   Review of Systems:  Review of Systems  Respiratory:  Negative for shortness of breath.   Cardiovascular:  Negative for chest pain.  Gastrointestinal:  Negative for abdominal pain, nausea and vomiting.    OBJECTIVE:   Temp:  [97.1 F (36.2 C)-98.6 F (37 C)] 98.2 F (36.8 C) (08/26 1125) Pulse Rate:  [50-93] 79 (08/26 1115) Resp:  [7-22] 12 (08/26 1115) BP: (65-169)/(22-102) 151/56 (08/26 1100) SpO2:  [52 %-100 %] 98 % (08/26 1115) Weight:  [70.2 kg] 70.2 kg (08/25 1417) Last BM Date :  (PTA) Physical Exam Constitutional:      General: She is not in acute distress.    Appearance: She is not toxic-appearing.  Cardiovascular:     Rate and Rhythm: Normal rate and regular rhythm.  Pulmonary:     Effort: No respiratory distress.     Breath sounds: Normal breath sounds.  Abdominal:     General: Bowel sounds are normal. There is no distension.     Palpations: Abdomen is soft.     Tenderness: There is no abdominal tenderness. There is no guarding.  Musculoskeletal:     Right lower leg: No edema.     Left lower leg: No edema.  Skin:    General: Skin is warm and dry.  Neurological:     Mental Status: She is alert.     Labs: Recent Labs    08/07/23 0919 08/07/23 1623 08/08/23 0143 08/08/23 0525 08/08/23 1105  WBC 9.2  --   --  13.4* 9.9  HGB 9.3*   < > 7.7* 6.8* 8.2*  HCT 31.5*   < > 24.1* 21.1* 24.8*  PLT 268  --   --  201 159   < > = values in this interval not displayed.   BMET Recent Labs    08/07/23 0919 08/08/23 0525  NA 136 137  K 5.2* 3.9  CL 105 114*  CO2 23 20*  GLUCOSE 163* 163*  BUN 36* 26*  CREATININE 1.06* 0.78  CALCIUM 8.0* 6.7*   LFT Recent Labs    08/08/23 0525  PROT 4.0*  ALBUMIN 2.3*   AST 12*  ALT 12  ALKPHOS 37*  BILITOT 0.6   PT/INR Recent Labs    08/07/23 0944  LABPROT 12.7  INR 0.9   Diagnostic imaging: CT ANGIO GI BLEED  Result Date: 08/08/2023 CLINICAL DATA:  Worsening rectal bleeding EXAM: CTA ABDOMEN AND PELVIS WITHOUT AND WITH  CONTRAST TECHNIQUE: Multidetector CT imaging of the abdomen and pelvis was performed using the standard protocol during bolus administration of intravenous contrast. Multiplanar reconstructed images and MIPs were obtained and reviewed to evaluate the vascular anatomy. RADIATION DOSE REDUCTION: This exam was performed according to the departmental dose-optimization program which includes automated exposure control, adjustment of the mA and/or kV according to patient size and/or use of iterative reconstruction technique. CONTRAST:  OMNIPAQUE IOHEXOL 350 MG/ML SOLN COMPARISON:  08/07/2023 FINDINGS: VASCULAR Normal contour and caliber of the abdominal aorta. Severe mixed calcific atherosclerosis. No evidence of aneurysm, dissection, or other acute aortic pathology. Standard branching pattern of the abdominal aorta with solitary bilateral renal arteries. Multifocal branch vessel stenosis as detailed by previous day's examination, particularly with significant stenoses of the celiac, superior mesenteric, right renal artery, and inferior mesenteric artery origins. Review of the MIP images confirms the above findings. NON-VASCULAR Lower Chest: No acute findings. Hepatobiliary: No solid liver abnormality is seen. Excreted contrast in the gallbladder. No gallstones, gallbladder wall thickening, or biliary dilatation. Pancreas: Unremarkable. No pancreatic ductal dilatation or surrounding inflammatory changes. Spleen: Normal in size without significant abnormality. Adrenals/Urinary Tract: Adrenal glands are unremarkable. Kidneys are normal, without renal calculi, solid lesion, or hydronephrosis. Excreted contrast in the urinary bladder. Stomach/Bowel:  Stomach is within normal limits. Appendix appears normal. No evidence of bowel wall thickening, distention, or inflammatory changes. Sigmoid diverticulosis. Admixed stool and contrast scattered throughout the colon and rectum, without evidence of intraluminal extravasation on contrast phases. Lymphatic: No enlarged abdominal or pelvic lymph nodes. Reproductive: No mass. Blood product within the vaginal vault (series 11, image 103). Other: No abdominal wall hernia or abnormality. No ascites. Musculoskeletal: No acute osseous findings. IMPRESSION: 1. Admixed stool and contrast scattered throughout the colon and rectum, without evidence of intraluminal extravasation on contrast phases to specifically localize nidus of GI bleeding. 2. Blood product within the vaginal vault of uncertain origin, again without visible extravasation. 3. Sigmoid diverticulosis without evidence of acute diverticulitis. 4. Severe mixed calcific atherosclerosis. Multifocal aortic branch vessel stenosis as detailed by previous day's examination, particularly with significant stenoses of the celiac, superior mesenteric, right renal artery, and inferior mesenteric artery origins. Aortic Atherosclerosis (ICD10-I70.0). Electronically Signed   By: Jearld Lesch M.D.   On: 08/08/2023 11:15   [place] EKG SITE RITE  Result Date: 08/07/2023 If Site Rite image not attached, placement could not be confirmed due to current cardiac rhythm.  CT ANGIO GI BLEED  Result Date: 08/07/2023 CLINICAL DATA:  Lower GI bleed EXAM: CTA ABDOMEN AND PELVIS WITHOUT AND WITH CONTRAST TECHNIQUE: Multidetector CT imaging of the abdomen and pelvis was performed using the standard protocol during bolus administration of intravenous contrast. Multiplanar reconstructed images and MIPs were obtained and reviewed to evaluate the vascular anatomy. RADIATION DOSE REDUCTION: This exam was performed according to the departmental dose-optimization program which includes automated  exposure control, adjustment of the mA and/or kV according to patient size and/or use of iterative reconstruction technique. CONTRAST:  OMNIPAQUE IOHEXOL 350 MG/ML SOLN COMPARISON:  Abdominal ultrasound 01/24/2016. CT of the abdomen and pelvis with contrast 08/28/2014. FINDINGS: VASCULAR Aorta: Diffuse atherosclerotic changes are present throughout the abdominal aorta. No aneurysm or focal stenosis is present. 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 The lumen is narrowed to 1 mm compared with a more normal distal segment at 5.7 mm. The more distal branch vessels are within normal limits. SMA: Atherosclerotic calcifications are present at the ostium. The lumen  slightly more distal is narrowed to 2 mm. This compares with 5.7 mm slightly more distal. The branch vessels are within normal limits. Renals: Atherosclerotic changes are present at the ostium of the right renal artery. A 50% stenosis is present. High-grade stenosis is present in the left lower pole renal artery. The upper pole renal artery is unremarkable. IMA: A high-grade stenosis is present at the ostium of the IMA. More distal branch vessels are within normal limits. Inflow: Atherosclerotic calcifications scratched at atherosclerotic changes are present in the proximal iliac arteries bilaterally without a significant stenosis. Proximal Outflow: Bilateral common femoral and visualized portions of the superficial and profunda femoral arteries are patent without evidence of aneurysm, dissection, vasculitis or significant stenosis. Veins: No obvious venous abnormality within the limitations of this arterial phase study. Review of the MIP images confirms the above findings. NON-VASCULAR Lower chest: Mild dependent fibrotic change or scarring is present. The lungs are otherwise clear. The heart size is normal. No significant pleural or pericardial effusion is present. Hepatobiliary: No focal liver abnormality is seen. No  gallstones, gallbladder wall thickening, or biliary dilatation. Pancreas: Unremarkable. No pancreatic ductal dilatation or surrounding inflammatory changes. Spleen: Normal in size without focal abnormality. Adrenals/Urinary Tract: Adrenal glands are normal bilaterally. A simple cyst anteriorly in the right kidney measures 16 mm. No other focal lesions are present. No stone or obstruction is present. Ureters are within normal limits bilaterally. Mild diffuse wall thickening is present in the urinary bladder. No discrete lesions are present. Stomach/Bowel: The stomach and duodenum are within normal limits. Small bowel is unremarkable. Terminal ileum is within normal limits. The appendix is visualized and normal. The ascending and transverse colon are within normal limits. The descending and sigmoid colon are unremarkable. Lymphatic: No significant abdominal adenopathy is present. Reproductive: Uterus and bilateral adnexa are unremarkable. Other: No abdominal wall hernia or abnormality. No abdominopelvic ascites. Musculoskeletal: Degenerative retrolisthesis at L1-2 measures 6 mm. No other significant listhesis is present. Bony pelvis is normal. The hips are located and within normal limits bilaterally. IMPRESSION: 1. No acute or focal lesion to explain the patient's lower GI bleed. No arterial enhancement within the bowel. 2. 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 3. Moderate stenosis of the proximal SMA. 4. High-grade stenosis of the IMA. 5. 50% stenosis of the right renal artery. 6. High-grade stenosis of the left lower pole renal artery. 7. 16 mm simple cyst in the right kidney. No follow-up imaging is recommended. JACR 2018 Feb; 264-273, Management of the Incidental Renal Mass on CT, [employer] 2021; 814-848, Bosniak Classification of Cystic Renal Masses, Version 2019. 8. Degenerative retrolisthesis at L1-2 measures 6 mm. 9. Mild diffuse wall thickening in the urinary bladder is nonspecific, but can  be seen in the setting of cystitis. 10.  Aortic Atherosclerosis (ICD10-I70.0). Electronically Signed   By: Marin Roberts M.D.   On: 08/07/2023 11:27    IMPRESSION: Hematochezia Personal history diverticulosis Personal history colon polyps Hypertension Hyperlipidemia Diabetes mellitus  PLAN: -No signs of further GI bleeding today -If bleeding were to recur overnight, recommend start bowel preparation for colonoscopy to investigate further, discussed with patient, she is understanding of this recommendation -Ok for clear liquid diet now -Trend H/H, transfuse for Hgb < 7 -Monitor stool output -[facility] will follow   LOS: 0 days   Charlene Shi, DO [facility]

## 2023-08-08 NOTE — Progress Notes (Signed)
Aline attempted x2 PT not able to tolerate, even after emla cream. RN notified.

## 2023-08-08 NOTE — Progress Notes (Signed)
RN noted cardiac rhythm changes. EKG obtained, NSR with BBB noted on EKG.  Pt asymptomatic.  MD made aware.  New orders placed.

## 2023-08-08 NOTE — Progress Notes (Signed)
NAME:  [name], MRN:  [id number], DOB:  06-08-1946, LOS: 0 ADMISSION DATE:  08/07/2023, CONSULTATION DATE:  08/07/23 REFERRING MD:  Bobette Mo, MD CHIEF COMPLAINT:  Acute blood loss anemia   History of Present Illness:  77 year old female with hx alcohol abuse (last drink 8 years), carotid artery stenosis, HLD, HTN, DM2, fibromyalgia, hx diverticulosis/diverticulitis who presents with hematochezia associated with gurgling of her stomach that began on morning of admission. On DAPT for carotid artery stenosis. She denied abdominal pain, dizziness, syncope. Denies frequent NSAID use. In the EDS she is hemodynamically stable on room air. Labs with Hg 9.3, Plt 268. INR 12.7/0.9. K 5.2 with BUN/Cr 36/1.06. Admitted by [facility]. GI consulted  PCCM consulted in setting of acute rectal bleed with borderline low-normal pressures. Has received 3.5L NS. Patient in Trendelenburg with SBP 110s. Reports abdominal gurgling and discomfort  Pertinent  Medical History  Alcohol abuse, carotid artery stenosis, HLD, HTN, DM2, fibromyalgia, hx diverticulosis/diverticulitis   Significant Hospital Events: Including procedures, antibiotic start and stop dates in addition to other pertinent events   8/25 PCCM consulted. Overnight hematochezia and shock. Transfused, given desmopressin PRBC and started on pressors 8/26 pressors off no further hematochezia   Interim History / Subjective:  Shock overnight after multiple episodes of hematochezia Was continued on pressors overnight even with SBP 120-140s   Repeat CTA GI bleed is ordered   Objective   Blood pressure (!) 165/44, pulse 73, temperature 98.1 F (36.7 C), temperature source Oral, resp. rate 13, height 5\' 3"  (1.6 m), weight 70.2 kg, SpO2 100%.        Intake/Output Summary (Last 24 hours) at 08/08/2023 1028 Last data filed at 08/08/2023 0940 Gross per 24 hour  Intake 4447.14 ml  Output 500 ml  Net 3947.14 ml   Filed Weights   08/07/23 0840  08/07/23 1417  Weight: 68 kg 70.2 kg   Physical Exam: General: wdwn acutely ill older adult F NAD  HEENT: NCAT pink mm anicteric sclera Neuro: AAOx4 following commands  Respiratory: even unlabored, CTA  Cardiovascular: rr cap refill < 3 sec  GI: soft non-distended. Mild generalized tenderness  Extremities:no acute deformity  Skin: c/d/w   Resolved Hospital Problem list   N/A  Assessment & Plan:   ABLA LGIB  Hemorrhagic shock, improved  -looks like 3 PRBC 1 plt + DDAVP were given  P -repeat CTA GIB study is ordered and pending due to overnight events -will check a BMP this evening given contrast load & start LR 100/hr x 5 hrs in interim.  - q6hr CBC, transfuse PRN  -dc NE  -GI is following, no plans for scope at present  -continue NPO  - holding DAPT as below  CAS HTN HLD  -holding DAPT and antihypertensives -cont cardiac monitoring   DM2 -SSI  Hypomagnesemia Hypocalcemia -replace   ADHD Anxiety/depression Fibromyalgia -holding home meds; NPO   Best Practice (right click and "Reselect all SmartList Selections" daily)   Diet/type: NPO DVT prophylaxis: other holding for bleed GI prophylaxis: PPI Lines: N/A Foley:  N/A Code Status:  full code Last date of multidisciplinary goals of care discussion [8/25]  Labs   CBC: Recent Labs  Lab 08/07/23 0919 08/07/23 1623 08/08/23 0143 08/08/23 0525  WBC 9.2  --   --  13.4*  NEUTROABS  --   --   --  10.1*  HGB 9.3* 5.8* 7.7* 6.8*  HCT 31.5* 18.7* 24.1* 21.1*  MCV 88.2  --   --  84.7  PLT 268  --   --  201    Basic Metabolic Panel: Recent Labs  Lab 08/07/23 0919 08/08/23 0525  NA 136 137  K 5.2* 3.9  CL 105 114*  CO2 23 20*  GLUCOSE 163* 163*  BUN 36* 26*  CREATININE 1.06* 0.78  CALCIUM 8.0* 6.7*  MG  --  1.6*  PHOS  --  3.8   GFR: Estimated Creatinine Clearance: 57.1 mL/min (by C-G formula based on SCr of 0.78 mg/dL). Recent Labs  Lab 08/07/23 0919 08/08/23 0525  WBC 9.2 13.4*     Liver Function Tests: Recent Labs  Lab 08/07/23 0919 08/08/23 0525  AST 26 12*  ALT 11 12  ALKPHOS 60 37*  BILITOT 0.6 0.6  PROT 6.2* 4.0*  ALBUMIN 3.3* 2.3*   No results for input(s): "LIPASE", "AMYLASE" in the last 168 hours. No results for input(s): "AMMONIA" in the last 168 hours.  ABG    Component Value Date/Time   TCO2 20 (L) 08/15/2022 1958     Coagulation Profile: Recent Labs  Lab 08/07/23 0944  INR 0.9    Cardiac Enzymes: No results for input(s): "CKTOTAL", "CKMB", "CKMBINDEX", "TROPONINI" in the last 168 hours.  HbA1C: Hgb A1c MFr Bld  Date/Time Value Ref Range Status  01/25/2016 08:05 AM 8.1 (H) 4.8 - 5.6 % Final    Comment:    (NOTE)         Pre-diabetes: 5.7 - 6.4         Diabetes: >6.4         Glycemic control for adults with diabetes: <7.0   02/10/2015 02:58 PM 6.8 (H) 4.6 - 6.5 % Final    Comment:    Glycemic Control Guidelines for People with Diabetes:Non Diabetic:  <6%Goal of Therapy: <7%Additional Action Suggested:  >8%     CBG: No results for input(s): "GLUCAP" in the last 168 hours.  CRITICAL CARE Performed by: Lanier Clam   Total critical care time: 36 minutes  Critical care time was exclusive of separately billable procedures and treating other patients. Critical care was necessary to treat or prevent imminent or life-threatening deterioration.  Critical care was time spent personally by me on the following activities: development of treatment plan with patient and/or surrogate as well as nursing, discussions with consultants, evaluation of patient's response to treatment, examination of patient, obtaining history from patient or surrogate, ordering and performing treatments and interventions, ordering and review of laboratory studies, ordering and review of radiographic studies, pulse oximetry and re-evaluation of patient's condition.  Tessie Fass MSN, AGACNP-BC [facility] Pulmonary/Critical Care Medicine Amion for pager   08/08/2023, 10:28 AM

## 2023-08-08 NOTE — Progress Notes (Signed)
[facility] ADULT ICU REPLACEMENT PROTOCOL   The patient does apply for the [facility] Adult ICU Electrolyte Replacment Protocol based on the criteria listed below:   1.Exclusion criteria: TCTS, ECMO, Dialysis, and Myasthenia Gravis patients 2. Is GFR >/= 30 ml/min? Yes.    Patient's GFR today is >60 3. Is SCr </= 2? Yes.   Patient's SCr is 0.78 mg/dL 4. Did SCr increase >/= 0.5 in 24 hours? No. 5.Pt's weight >40kg  Yes.   6. Abnormal electrolyte(s): Mag  7. Electrolytes replaced per protocol 8.  Call MD STAT for K+ </= 2.5, Phos </= 1, or Mag </= 1 Physician:  Thersa Salt Mayur Duman 08/08/2023 6:22 AM

## 2023-08-08 NOTE — Progress Notes (Signed)
Patient not seen. CCM will take over. Transfer to [facility] when out of ICU.

## 2023-08-08 NOTE — Progress Notes (Addendum)
eLink Physician-Brief Progress Note Patient Name: [name] DOB: 09-17-1946 MRN: [id number]   Date of Service  08/08/2023  HPI/Events of Note  77 year old female that initially presented with lower GI bleed with now stabilized hemoglobin.  Anticipating transfer to hospitalist tomorrow.  Complaining of anxiety, received Ativan p.o. at home.  eICU Interventions  Reorder home p.o. Ativan.   2111 -per bedside team, the patient is swollen and has received large amount of crystalloid and colloid infusion over the past 24-48 hours.  Hemodynamically stable.  Team favored low volume resuscitation earlier today, would favor maintaining euvolemia-the patient is urinating appropriately.  +3.6 L for the stay with uncalculated bleeding-the patient can manage volume status autonomously and no intervention is indicated.  2341 -persistent agitation and restlessness.  Also requesting her home Lyrica.  Reordered 100 mg twice daily per home dosing.  Intervention Category Minor Interventions: Agitation / anxiety - evaluation and management  [name] 08/08/2023, 8:07 PM

## 2023-08-08 NOTE — Progress Notes (Addendum)
       Overnight   NAME: [name] MRN: [id number] DOB : 1946/04/26    Date of Service   08/08/2023   HPI/Events of Note    Notified by RN for bloody bowel movement corresponding hypotension.  77 year old female history of alcohol abuse(8 years ago was last drink), carotid artery stenosis, HLD, HTN, DM 2, fibromyalgia, diverticulosis/diverticulitis. Admitted through the ER with hematochezia. On DAPT for carotid artery stenosis.  CCM was initially consulted in the setting of acute rectal bleed with borderline pressures She has received 3.5 L of normal saline, and being in Trendelenburg position. She has reported abdominal gurgling and discomfort.  Bedside nursing reported blood pressure in the 60s(multiple checks). At that time CCM was alerted, patient was made ICU status, and the addition of emergent blood, Levophed, PICC line, and A-line were ordered.  Patient remains on (titrating) Levophed with blood pressures  120-140 sbp. Mentation was maintained throughout.    Interventions/ Plan   Remain ICU status Replace blood work CCM Continue all previous.      Chinita Greenland BSN MSNA MSN ACNPC-AG Acute Care Nurse Practitioner [facility]

## 2023-08-09 DIAGNOSIS — K922 Gastrointestinal hemorrhage, unspecified: Secondary | ICD-10-CM | POA: Diagnosis not present

## 2023-08-09 LAB — MAGNESIUM: Magnesium: 2 mg/dL (ref 1.7–2.4)

## 2023-08-09 LAB — CBC
HCT: 26.8 % — ABNORMAL LOW (ref 36.0–46.0)
HCT: 26.7 % — ABNORMAL LOW (ref 36.0–46.0)
HCT: 28.5 % — ABNORMAL LOW (ref 36.0–46.0)
Hemoglobin: 8.8 g/dL — ABNORMAL LOW (ref 12.0–15.0)
Hemoglobin: 8.9 g/dL — ABNORMAL LOW (ref 12.0–15.0)
Hemoglobin: 9.6 g/dL — ABNORMAL LOW (ref 12.0–15.0)
MCH: 28.1 pg (ref 26.0–34.0)
MCH: 28.8 pg (ref 26.0–34.0)
MCH: 28.9 pg (ref 26.0–34.0)
MCHC: 32.8 g/dL (ref 30.0–36.0)
MCHC: 33.3 g/dL (ref 30.0–36.0)
MCHC: 33.7 g/dL (ref 30.0–36.0)
MCV: 85.6 fL (ref 80.0–100.0)
MCV: 86.4 fL (ref 80.0–100.0)
MCV: 85.8 fL (ref 80.0–100.0)
Platelets: 152 10*3/uL (ref 150–400)
Platelets: 154 10*3/uL (ref 150–400)
Platelets: 172 10*3/uL (ref 150–400)
RBC: 3.13 MIL/uL — ABNORMAL LOW (ref 3.87–5.11)
RBC: 3.09 MIL/uL — ABNORMAL LOW (ref 3.87–5.11)
RBC: 3.32 MIL/uL — ABNORMAL LOW (ref 3.87–5.11)
RDW: 16 % — ABNORMAL HIGH (ref 11.5–15.5)
RDW: 16.1 % — ABNORMAL HIGH (ref 11.5–15.5)
RDW: 16.2 % — ABNORMAL HIGH (ref 11.5–15.5)
WBC: 9.9 10*3/uL (ref 4.0–10.5)
WBC: 8.7 10*3/uL (ref 4.0–10.5)
WBC: 8.7 10*3/uL (ref 4.0–10.5)
nRBC: 0 % (ref 0.0–0.2)
nRBC: 0 % (ref 0.0–0.2)
nRBC: 0 % (ref 0.0–0.2)

## 2023-08-09 LAB — BASIC METABOLIC PANEL
Anion gap: 8 (ref 5–15)
BUN: 11 mg/dL (ref 8–23)
CO2: 21 mmol/L — ABNORMAL LOW (ref 22–32)
Calcium: 7.9 mg/dL — ABNORMAL LOW (ref 8.9–10.3)
Chloride: 105 mmol/L (ref 98–111)
Creatinine, Ser: 0.57 mg/dL (ref 0.44–1.00)
GFR, Estimated: >60 mL/min (ref >60)
Glucose, Bld: 95 mg/dL (ref 70–99)
Potassium: 3.8 mmol/L (ref 3.5–5.1)
Sodium: 134 mmol/L — ABNORMAL LOW (ref 135–145)

## 2023-08-09 LAB — GLUCOSE, CAPILLARY
Glucose-Capillary: 88 mg/dL (ref 70–99)
Glucose-Capillary: 101 mg/dL — ABNORMAL HIGH (ref 70–99)

## 2023-08-09 LAB — BPAM PLATELET PHERESIS
Blood Product Expiration Date: 202408252359
Blood Product Expiration Date: 202408282359
ISSUE DATE / TIME: 202408260110
Unit Type and Rh: 5100
Unit Type and Rh: 6200

## 2023-08-09 LAB — CBC WITH DIFFERENTIAL/PLATELET
Abs Immature Granulocytes: 0.03 10*3/uL (ref 0.00–0.07)
Basophils Absolute: 0 10*3/uL (ref 0.0–0.1)
Basophils Relative: 1 %
Eosinophils Absolute: 0.2 10*3/uL (ref 0.0–0.5)
Eosinophils Relative: 3 %
HCT: 28.1 % — ABNORMAL LOW (ref 36.0–46.0)
Hemoglobin: 9.3 g/dL — ABNORMAL LOW (ref 12.0–15.0)
Immature Granulocytes: 0 %
Lymphocytes Relative: 22 %
Lymphs Abs: 1.7 10*3/uL (ref 0.7–4.0)
MCH: 28.3 pg (ref 26.0–34.0)
MCHC: 33.1 g/dL (ref 30.0–36.0)
MCV: 85.4 fL (ref 80.0–100.0)
Monocytes Absolute: 0.6 10*3/uL (ref 0.1–1.0)
Monocytes Relative: 7 %
Neutro Abs: 5.3 10*3/uL (ref 1.7–7.7)
Neutrophils Relative %: 67 %
Platelets: 174 10*3/uL (ref 150–400)
RBC: 3.29 MIL/uL — ABNORMAL LOW (ref 3.87–5.11)
RDW: 16.2 % — ABNORMAL HIGH (ref 11.5–15.5)
WBC: 7.9 10*3/uL (ref 4.0–10.5)
nRBC: 0 % (ref 0.0–0.2)

## 2023-08-09 LAB — PREPARE PLATELET PHERESIS
Unit division: 0
Unit division: 0

## 2023-08-09 MED ORDER — LISINOPRIL 5 MG PO TABS
5.0000 mg | ORAL_TABLET | Freq: Every day | ORAL | Status: DC
  Administered 2023-08-09 – 2023-08-11 (×3): 5 mg via ORAL
  Filled 2023-08-09: qty 1
  Filled 2023-08-09 (×2): qty 2

## 2023-08-09 MED ORDER — NEBIVOLOL HCL 10 MG PO TABS
5.0000 mg | ORAL_TABLET | Freq: Every day | ORAL | Status: DC
  Administered 2023-08-09 – 2023-08-11 (×3): 5 mg via ORAL
  Filled 2023-08-09 (×3): qty 1

## 2023-08-09 MED ORDER — INSULIN ASPART 100 UNIT/ML IJ SOLN
0.0000 [IU] | Freq: Three times a day (TID) | INTRAMUSCULAR | Status: DC
  Administered 2023-08-11: 2 [IU] via SUBCUTANEOUS

## 2023-08-09 NOTE — Progress Notes (Signed)
[facility] Progress Note  SUBJECTIVE:   Interval history: [name] was seen and evaluated today at bedside. Resting comfortably in bed. Denied abdominal pain. No nausea or vomiting. Tolerating clear liquids. Noted having some dark red stool this AM. None this afternoon. Hgb stable.   Past Medical History:  Diagnosis Date   Alcohol abuse    CAP (community acquired pneumonia) 05/05/2018   Carotid artery stenosis    Depression    Diabetes mellitus    Diverticulitis    Essential hypertension 08/20/2007   Qualifier: Diagnosis of   By: Jonny Ruiz MD, Len Blalock        Fibromyalgia    Hepatitis 08/28/2014   Hyperlipidemia 08/20/2007   Qualifier: Diagnosis of   By: Jonny Ruiz MD, Len Blalock        Hypertension    Myalgia and myositis 03/28/2012   Past Surgical History:  Procedure Laterality Date   BUNIONECTOMY     CAROTID PTA/STENT INTERVENTION Left 08/07/2020   Procedure: CAROTID PTA/STENT INTERVENTION;  Surgeon: Annice Needy, MD;  Location: [facility] INVASIVE CV LAB;  Service: Cardiovascular;  Laterality: Left;   CESAREAN SECTION     COLONOSCOPY     COLONOSCOPY WITH ESOPHAGOGASTRODUODENOSCOPY (EGD)     COLONOSCOPY WITH PROPOFOL N/A 02/18/2021   Procedure: COLONOSCOPY WITH PROPOFOL;  Surgeon: Toledo, Boykin Nearing, MD;  Location: [facility] ENDOSCOPY;  Service: Gastroenterology;  Laterality: N/A;   ESOPHAGOGASTRODUODENOSCOPY (EGD) WITH PROPOFOL N/A 02/18/2021   Procedure: ESOPHAGOGASTRODUODENOSCOPY (EGD) WITH PROPOFOL;  Surgeon: Toledo, Boykin Nearing, MD;  Location: [facility] ENDOSCOPY;  Service: Gastroenterology;  Laterality: N/A;   TONSILLECTOMY     Current Facility-Administered Medications  Medication Dose Route Frequency Provider Last Rate Last Admin   0.9 %  sodium chloride infusion (Manually program via Guardrails IV Fluids)   Intravenous Once Conrad [place], MD   Held at 08/07/23 2129   0.9 %  sodium chloride infusion (Manually program via Guardrails IV Fluids)   Intravenous Once Conrad Col[place]il, MD    Held at 08/07/23 2129   0.9 %  sodium chloride infusion (Manually program via Guardrails IV Fluids)   Intravenous Once Conrad Arley, MD   Held at 08/08/23 0809   0.9 %  sodium chloride infusion (Manually program via Guardrails IV Fluids)   Intravenous Once Lorin Glass, MD   Held at 08/08/23 2112   0.9 %  sodium chloride infusion  250 mL Intravenous Continuous Conrad [place], MD   Held at 08/07/23 2128   0.9 %  sodium chloride infusion   Intra-arterial PRN Paliwal, Eliezer Lofts, MD       acetaminophen (TYLENOL) tablet 650 mg  650 mg Oral Q6H PRN Bobette Mo, MD   650 mg at 08/09/23 1010   Or   acetaminophen (TYLENOL) suppository 650 mg  650 mg Rectal Q6H PRN Bobette Mo, MD       Chlorhexidine Gluconate Cloth 2 % PADS 6 each  6 each Topical Q0600 Paliwal, Aditya, MD   6 each at 08/09/23 1011   fentaNYL (SUBLIMAZE) injection 50 mcg  50 mcg Intravenous Q2H PRN Paliwal, Aditya, MD   50 mcg at 08/08/23 1700   insulin aspart (novoLOG) injection 0-15 Units  0-15 Units Subcutaneous TID WC Jonah Blue, MD       lidocaine-prilocaine (EMLA) cream   Topical Q8H PRN Conrad Hughes, MD   Given at 08/07/23 2311   lisinopril (ZESTRIL) tablet 5 mg  5 mg Oral Daily Jonah Blue, MD       LORazepam (  ATIVAN) tablet 0.5 mg  0.5 mg Oral Daily PRN Paliwal, Aditya, MD   0.5 mg at 08/08/23 2022   mupirocin ointment (BACTROBAN) 2 % 1 Application  1 Application Nasal BID Bobette Mo, MD   1 Application at 08/09/23 1011   nebivolol (BYSTOLIC) tablet 5 mg  5 mg Oral Daily Jonah Blue, MD       ondansetron [facility]) tablet 4 mg  4 mg Oral Q6H PRN Bobette Mo, MD       Or   ondansetron [facility]) injection 4 mg  4 mg Intravenous Q6H PRN Bobette Mo, MD       pantoprazole (PROTONIX) injection 40 mg  40 mg Intravenous Q24H Bobette Mo, MD   40 mg at 08/09/23 1010   pregabalin (LYRICA) capsule 100 mg  100 mg Oral BID Paliwal, Aditya, MD   100 mg at 08/09/23 1010   sodium  chloride flush (NS) 0.9 % injection 10-40 mL  10-40 mL Intracatheter PRN Bobette Mo, MD       sodium chloride flush (NS) 0.9 % injection 10-40 mL  10-40 mL Intracatheter Q12H Bobette Mo, MD   10 mL at 08/09/23 1011   sodium chloride flush (NS) 0.9 % injection 10-40 mL  10-40 mL Intracatheter PRN Bobette Mo, MD       Allergies as of 08/07/2023 - Review Complete 08/07/2023  Allergen Reaction Noted   Latex Hives 11/10/2011   Lipitor [atorvastatin calcium] Other (See Comments) 03/12/2012   Codeine Rash 08/20/2007   Pentazocine lactate Rash 08/20/2007   Sulfonamide derivatives Rash 08/20/2007   Review of Systems:  Review of Systems  Gastrointestinal:  Positive for blood in stool. Negative for abdominal pain, nausea and vomiting.    OBJECTIVE:   Temp:  [97.8 F (36.6 C)-98.5 F (36.9 C)] 98.3 F (36.8 C) (08/27 1200) Pulse Rate:  [56-88] 61 (08/27 1500) Resp:  [10-19] 17 (08/27 1500) BP: (138-175)/(34-95) 154/44 (08/27 1500) SpO2:  [97 %-100 %] 99 % (08/27 1500) Last BM Date : 08/09/23 Physical Exam Constitutional:      General: She is not in acute distress.    Appearance: She is not ill-appearing, toxic-appearing or diaphoretic.  Cardiovascular:     Rate and Rhythm: Normal rate and regular rhythm.  Pulmonary:     Effort: No respiratory distress.     Breath sounds: Normal breath sounds.     Comments: No supplemental oxygen in place. Abdominal:     General: Bowel sounds are normal. There is no distension.     Palpations: Abdomen is soft.     Tenderness: There is no abdominal tenderness. There is no guarding.  Neurological:     Mental Status: She is alert.     Labs: Recent Labs    08/09/23 0214 08/09/23 0611 08/09/23 0922  WBC 9.9 8.7 8.7  HGB 8.8* 8.9* 9.6*  HCT 26.8* 26.7* 28.5*  PLT 152 154 172   BMET Recent Labs    08/08/23 0525 08/08/23 1705 08/09/23 0611  NA 137 139 134*  K 3.9 3.7 3.8  CL 114* 108 105  CO2 20* 20* 21*   GLUCOSE 163* 124* 95  BUN 26* 14 11  CREATININE 0.78 0.66 0.57  CALCIUM 6.7* 7.7* 7.9*   LFT Recent Labs    08/08/23 0525  PROT 4.0*  ALBUMIN 2.3*  AST 12*  ALT 12  ALKPHOS 37*  BILITOT 0.6   PT/INR Recent Labs    08/07/23 0944  LABPROT 12.7  INR 0.9   Diagnostic imaging: CT ANGIO GI BLEED  Result Date: 08/08/2023 CLINICAL DATA:  Worsening rectal bleeding EXAM: CTA ABDOMEN AND PELVIS WITHOUT AND WITH CONTRAST TECHNIQUE: Multidetector CT imaging of the abdomen and pelvis was performed using the standard protocol during bolus administration of intravenous contrast. Multiplanar reconstructed images and MIPs were obtained and reviewed to evaluate the vascular anatomy. RADIATION DOSE REDUCTION: This exam was performed according to the departmental dose-optimization program which includes automated exposure control, adjustment of the mA and/or kV according to patient size and/or use of iterative reconstruction technique. CONTRAST:  OMNIPAQUE IOHEXOL 350 MG/ML SOLN COMPARISON:  08/07/2023 FINDINGS: VASCULAR Normal contour and caliber of the abdominal aorta. Severe mixed calcific atherosclerosis. No evidence of aneurysm, dissection, or other acute aortic pathology. Standard branching pattern of the abdominal aorta with solitary bilateral renal arteries. Multifocal branch vessel stenosis as detailed by previous day's examination, particularly with significant stenoses of the celiac, superior mesenteric, right renal artery, and inferior mesenteric artery origins. Review of the MIP images confirms the above findings. NON-VASCULAR Lower Chest: No acute findings. Hepatobiliary: No solid liver abnormality is seen. Excreted contrast in the gallbladder. No gallstones, gallbladder wall thickening, or biliary dilatation. Pancreas: Unremarkable. No pancreatic ductal dilatation or surrounding inflammatory changes. Spleen: Normal in size without significant abnormality. Adrenals/Urinary Tract: Adrenal  glands are unremarkable. Kidneys are normal, without renal calculi, solid lesion, or hydronephrosis. Excreted contrast in the urinary bladder. Stomach/Bowel: Stomach is within normal limits. Appendix appears normal. No evidence of bowel wall thickening, distention, or inflammatory changes. Sigmoid diverticulosis. Admixed stool and contrast scattered throughout the colon and rectum, without evidence of intraluminal extravasation on contrast phases. Lymphatic: No enlarged abdominal or pelvic lymph nodes. Reproductive: No mass. Blood product within the vaginal vault (series 11, image 103). Other: No abdominal wall hernia or abnormality. No ascites. Musculoskeletal: No acute osseous findings. IMPRESSION: 1. Admixed stool and contrast scattered throughout the colon and rectum, without evidence of intraluminal extravasation on contrast phases to specifically localize nidus of GI bleeding. 2. Blood product within the vaginal vault of uncertain origin, again without visible extravasation. 3. Sigmoid diverticulosis without evidence of acute diverticulitis. 4. Severe mixed calcific atherosclerosis. Multifocal aortic branch vessel stenosis as detailed by previous day's examination, particularly with significant stenoses of the celiac, superior mesenteric, right renal artery, and inferior mesenteric artery origins. Aortic Atherosclerosis (ICD10-I70.0). Electronically Signed   By: Jearld Lesch M.D.   On: 08/08/2023 11:15   [place] EKG SITE RITE  Result Date: 08/07/2023 If Site Rite image not attached, placement could not be confirmed due to current cardiac rhythm.   IMPRESSION: Hematochezia Personal history diverticulosis Personal history colon polyps Hypertension Hyperlipidemia Diabetes mellitus  PLAN: -Likely diverticular bleeding that is slowing, Hgb is stable -Ok to advance to full liquids today -Trend H/H, transfuse for Hgb < 7  -Monitor stool output -Likely does not need colonoscopy this admission -[facility]  will follow   LOS: 1 day   Liliane Shi, [facility]

## 2023-08-09 NOTE — Progress Notes (Signed)
Progress Note   Patient: [name]:[id number] DOB: 08/19/1946 DOA: 08/07/2023     1 DOS: the patient was seen and examined on 08/09/2023   Brief hospital course: 77yo with h/o ETOH use disorder, HLD, HTN, carotid stenosis on DAPT, and DM who presented on 8/25 with hematochezia.  Hgb 9.3 on presentation, negative CTA for bleeding but with high-grade stenosis of proximal celiac artery, IMA, and L lower pole renal artery as well as moderate stenosis the proximal SMA and R renal arteries.  She was admitted to SDU and started on Protonix drip with GI consulting (no plan for scope, follow clinically and repeat CTA if ongoing bleeding).  She developed hypotension despite fluid repletion and was transferred to ICU.  She was emergently transfused 3 unit PRBCs. She was given desmopressin x 1.  Repeat CTA also unremarkable.  Started on clear liquids and transferred out of ICU.    Assessment and Plan:  Lower GI Bleeding Patient presented on 8/25 with hematochezia Hemorrhagic shock on admission, resolved Ongoing dark bloody stools with small clots this AM, new vs. Old blood clearing Most likely etiology is diverticular bleeding although she has known stenosis and ischemic colitis is a lesser consideration Admitted to SDU Continue to monitor for recurrent bleeding Repeat CTA done and there was no obvious localized nidus of bleeding, also with blood product in the vaginal vault (and clinically evident on Purewick this AM) GI consulting, recommends CLD for now Continue to monitor BID CBC Continue IV Protonix q 24 hours  ABLA Hgb stable this AM Transfused 3 units PRBC to date Monitor closely and follow cbc q12h, transfuse as necessary for Hbg <8 with h/o ASCVD  PVD Patient with known carotid stenosis and imaging this hospitalization with other multivessel disease She is at increased risk for ischemic colitis but does not have the pain out of proportion to exam to suggest this diagnosis For now,  plan for outpatient vascular f/u Holding ASA and Plavix in the setting of GI bleeding  Essential hypertension BP low on admission, increasing now Resume home Bystolic, lisinopril   Diabetes mellitus, type 2 (HCC) Will check A1c hold Glucophage, Trulicity, Jardiance Cover with moderate-scale SSI    Hyperlipidemia No longer appears to be taking rosuvastatin Has reported statin intolerance  ADHD Hold medication - she will not be performing tasks that require attention and focus as an inpatient     Consultants: GI PCCM  Procedures: None  Antibiotics: None  30 Day Unplanned Readmission Risk Score    Flowsheet Row ED to Hosp-Admission (Current) from 08/07/2023 in [facility]ICU/STEPDOWN  30 Day Unplanned Readmission Risk Score (%) 11.98 Filed at 08/09/2023 0400       This score is the patient's risk of an unplanned readmission within 30 days of being discharged (0 -100%). The score is based on dignosis, age, lab data, medications, orders, and past utilization.   Low:  0-14.9   Medium: 15-21.9   High: 22-29.9   Extreme: 30 and above           Subjective: She has had 2 dark BMs with some clots this AM, cramping surrounding BMs but otherwise without significant pain.  She is concerned about her multivessel stenosis seen on imaging, saw Dr. Wyn Quaker about carotids recently and was doing well at that time.   Objective: Vitals:   08/09/23 1400 08/09/23 1500  BP: (!) 154/34 (!) 154/44  Pulse: 61 61  Resp: 14 17  Temp:    SpO2: 97%  99%    Intake/Output Summary (Last 24 hours) at 08/09/2023 1542 Last data filed at 08/09/2023 0352 Gross per 24 hour  Intake 957.65 ml  Output 1400 ml  Net -442.35 ml   Filed Weights   08/07/23 0840 08/07/23 1417  Weight: 68 kg 70.2 kg    Exam:  General:  Appears calm and comfortable and is in NAD Eyes:  EOMI, normal lids, iris ENT:  grossly normal hearing, lips & tongue, mmm Neck:  no LAD, masses or  thyromegaly Cardiovascular:  RRR, no m/r/g. No LE edema.  Respiratory:   CTA bilaterally with no wheezes/rales/rhonchi.  Normal respiratory effort. Abdomen:  soft, mild lower abdominal pain, ND Skin:  no rash or induration seen on limited exam Musculoskeletal:  grossly normal tone BUE/BLE, good ROM, no bony abnormality Psychiatric:  blunted mood and affect, speech fluent and appropriate, AOx3 Neurologic:  CN 2-12 grossly intact, moves all extremities in coordinated fashion  Data Reviewed: I have reviewed the patient's lab results since admission.  Pertinent labs for today include:  Unremarkable BMP WBC 8.7 Hgb 8.9, stable     Family Communication: Husband was present throughout evaluation  Disposition: Status is: Inpatient Remains inpatient appropriate because: ongoing GI bleeding  Planned Discharge Destination: Home    Time spent: 50 minutes  Author: Jonah Blue, MD 08/09/2023 3:42 PM  For on call review [email or web address].

## 2023-08-10 DIAGNOSIS — K922 Gastrointestinal hemorrhage, unspecified: Secondary | ICD-10-CM | POA: Diagnosis not present

## 2023-08-10 LAB — BASIC METABOLIC PANEL
Anion gap: 8 (ref 5–15)
BUN: 10 mg/dL (ref 8–23)
CO2: 25 mmol/L (ref 22–32)
Calcium: 8.5 mg/dL — ABNORMAL LOW (ref 8.9–10.3)
Chloride: 106 mmol/L (ref 98–111)
Creatinine, Ser: 0.56 mg/dL (ref 0.44–1.00)
GFR, Estimated: >60 mL/min (ref >60)
Glucose, Bld: 103 mg/dL — ABNORMAL HIGH (ref 70–99)
Potassium: 3.8 mmol/L (ref 3.5–5.1)
Sodium: 139 mmol/L (ref 135–145)

## 2023-08-10 LAB — GLUCOSE, CAPILLARY
Glucose-Capillary: 106 mg/dL — ABNORMAL HIGH (ref 70–99)
Glucose-Capillary: 98 mg/dL (ref 70–99)
Glucose-Capillary: 94 mg/dL (ref 70–99)
Glucose-Capillary: 124 mg/dL — ABNORMAL HIGH (ref 70–99)

## 2023-08-10 LAB — CBC WITH DIFFERENTIAL/PLATELET
Abs Immature Granulocytes: 0.03 10*3/uL (ref 0.00–0.07)
Basophils Absolute: 0 10*3/uL (ref 0.0–0.1)
Basophils Relative: 1 %
Eosinophils Absolute: 0.3 10*3/uL (ref 0.0–0.5)
Eosinophils Relative: 3 %
HCT: 28 % — ABNORMAL LOW (ref 36.0–46.0)
Hemoglobin: 9.1 g/dL — ABNORMAL LOW (ref 12.0–15.0)
Immature Granulocytes: 0 %
Lymphocytes Relative: 17 %
Lymphs Abs: 1.5 10*3/uL (ref 0.7–4.0)
MCH: 28 pg (ref 26.0–34.0)
MCHC: 32.5 g/dL (ref 30.0–36.0)
MCV: 86.2 fL (ref 80.0–100.0)
Monocytes Absolute: 0.7 10*3/uL (ref 0.1–1.0)
Monocytes Relative: 8 %
Neutro Abs: 6.4 10*3/uL (ref 1.7–7.7)
Neutrophils Relative %: 71 %
Platelets: 174 10*3/uL (ref 150–400)
RBC: 3.25 MIL/uL — ABNORMAL LOW (ref 3.87–5.11)
RDW: 16.5 % — ABNORMAL HIGH (ref 11.5–15.5)
WBC: 8.8 10*3/uL (ref 4.0–10.5)
nRBC: 0 % (ref 0.0–0.2)

## 2023-08-10 LAB — HEMOGLOBIN A1C
Hgb A1c MFr Bld: 5.8 % — ABNORMAL HIGH (ref 4.8–5.6)
Mean Plasma Glucose: 119.76 mg/dL

## 2023-08-10 MED ORDER — ASPIRIN 81 MG PO TBEC
81.0000 mg | DELAYED_RELEASE_TABLET | Freq: Every day | ORAL | Status: DC
  Administered 2023-08-10 – 2023-08-11 (×2): 81 mg via ORAL
  Filled 2023-08-10 (×2): qty 1

## 2023-08-10 MED ORDER — CLOPIDOGREL BISULFATE 75 MG PO TABS
75.0000 mg | ORAL_TABLET | Freq: Every day | ORAL | Status: DC
  Administered 2023-08-11: 75 mg via ORAL
  Filled 2023-08-10: qty 1

## 2023-08-10 NOTE — Progress Notes (Signed)
[facility] Progress Note  SUBJECTIVE:   Interval history: ALEASE [name] was seen and evaluated today at bedside. Resting comfortably in bed. Noted that she had multiple dark brown bowel movements today. She had a small amount of pelvic discomfort after bowel movement. Tolerating oral intake. No chest pain or shortness of breath.  Past Medical History:  Diagnosis Date   Alcohol abuse    CAP (community acquired pneumonia) 05/05/2018   Carotid artery stenosis    Depression    Diabetes mellitus    Diverticulitis    Essential hypertension 08/20/2007   Qualifier: Diagnosis of   By: Jonny Ruiz MD, Len Blalock        Fibromyalgia    Hepatitis 08/28/2014   Hyperlipidemia 08/20/2007   Qualifier: Diagnosis of   By: Jonny Ruiz MD, Len Blalock        Hypertension    Myalgia and myositis 03/28/2012   Past Surgical History:  Procedure Laterality Date   BUNIONECTOMY     CAROTID PTA/STENT INTERVENTION Left 08/07/2020   Procedure: CAROTID PTA/STENT INTERVENTION;  Surgeon: Annice Needy, MD;  Location: [facility] INVASIVE CV LAB;  Service: Cardiovascular;  Laterality: Left;   CESAREAN SECTION     COLONOSCOPY     COLONOSCOPY WITH ESOPHAGOGASTRODUODENOSCOPY (EGD)     COLONOSCOPY WITH PROPOFOL N/A 02/18/2021   Procedure: COLONOSCOPY WITH PROPOFOL;  Surgeon: Toledo, Boykin Nearing, MD;  Location: [facility] ENDOSCOPY;  Service: Gastroenterology;  Laterality: N/A;   ESOPHAGOGASTRODUODENOSCOPY (EGD) WITH PROPOFOL N/A 02/18/2021   Procedure: ESOPHAGOGASTRODUODENOSCOPY (EGD) WITH PROPOFOL;  Surgeon: Toledo, Boykin Nearing, MD;  Location: [facility] ENDOSCOPY;  Service: Gastroenterology;  Laterality: N/A;   TONSILLECTOMY     Current Facility-Administered Medications  Medication Dose Route Frequency Provider Last Rate Last Admin   0.9 %  sodium chloride infusion  250 mL Intravenous Continuous Paliwal, Aditya, MD   Held at 08/07/23 2128   0.9 %  sodium chloride infusion   Intra-arterial PRN Paliwal, Aditya, MD       acetaminophen (TYLENOL)  tablet 650 mg  650 mg Oral Q6H PRN Bobette Mo, MD   650 mg at 08/09/23 1010   Or   acetaminophen (TYLENOL) suppository 650 mg  650 mg Rectal Q6H PRN Bobette Mo, MD       Chlorhexidine Gluconate Cloth 2 % PADS 6 each  6 each Topical Q0600 Paliwal, Aditya, MD   6 each at 08/10/23 1533   fentaNYL (SUBLIMAZE) injection 50 mcg  50 mcg Intravenous Q2H PRN Paliwal, Aditya, MD   50 mcg at 08/08/23 1700   insulin aspart (novoLOG) injection 0-15 Units  0-15 Units Subcutaneous TID WC Jonah Blue, MD       lidocaine-prilocaine (EMLA) cream   Topical Q8H PRN Conrad [place], MD   Given at 08/07/23 2311   lisinopril (ZESTRIL) tablet 5 mg  5 mg Oral Daily Jonah Blue, MD   5 mg at 08/10/23 8416   LORazepam (ATIVAN) tablet 0.5 mg  0.5 mg Oral Daily PRN Conrad [place], MD   0.5 mg at 08/09/23 2134   mupirocin ointment (BACTROBAN) 2 % 1 Application  1 Application Nasal BID Bobette Mo, MD   1 Application at 08/09/23 1011   nebivolol (BYSTOLIC) tablet 5 mg  5 mg Oral Daily Jonah Blue, MD   5 mg at 08/10/23 0942   ondansetron (ZOFRAN) tablet 4 mg  4 mg Oral Q6H PRN Bobette Mo, MD       Or   ondansetron [facility]) injection 4 mg  4 mg Intravenous Q6H PRN Bobette Mo, MD       pantoprazole (PROTONIX) injection 40 mg  40 mg Intravenous Q24H Bobette Mo, MD   40 mg at 08/10/23 0944   pregabalin (LYRICA) capsule 100 mg  100 mg Oral BID Paliwal, Aditya, MD   100 mg at 08/10/23 0942   sodium chloride flush (NS) 0.9 % injection 10-40 mL  10-40 mL Intracatheter PRN Bobette Mo, MD       sodium chloride flush (NS) 0.9 % injection 10-40 mL  10-40 mL Intracatheter Q12H Bobette Mo, MD   20 mL at 08/10/23 0944   sodium chloride flush (NS) 0.9 % injection 10-40 mL  10-40 mL Intracatheter PRN Bobette Mo, MD       Allergies as of 08/07/2023 - Review Complete 08/07/2023  Allergen Reaction Noted   Latex Hives 11/10/2011   Lipitor [atorvastatin  calcium] Other (See Comments) 03/12/2012   Codeine Rash 08/20/2007   Pentazocine lactate Rash 08/20/2007   Sulfonamide derivatives Rash 08/20/2007   Review of Systems:  Review of Systems  Respiratory:  Negative for shortness of breath.   Cardiovascular:  Negative for chest pain.  Gastrointestinal:  Positive for abdominal pain. Negative for nausea and vomiting.    OBJECTIVE:   Temp:  [98 F (36.7 C)-98.4 F (36.9 C)] 98.4 F (36.9 C) (08/28 1511) Pulse Rate:  [54-76] 68 (08/28 1500) Resp:  [9-25] 15 (08/28 1500) BP: (105-177)/(52-119) 177/73 (08/28 1500) SpO2:  [91 %-99 %] 98 % (08/28 1500) Last BM Date : 08/09/23 Physical Exam Constitutional:      General: She is not in acute distress.    Appearance: She is not ill-appearing, toxic-appearing or diaphoretic.  Cardiovascular:     Rate and Rhythm: Normal rate and regular rhythm.  Pulmonary:     Effort: No respiratory distress.     Breath sounds: Normal breath sounds.  Abdominal:     General: Bowel sounds are normal. There is no distension.     Palpations: Abdomen is soft.     Tenderness: There is no abdominal tenderness. There is no guarding.  Neurological:     Mental Status: She is alert.     Labs: Recent Labs    08/09/23 0922 08/09/23 1810 08/10/23 0411  WBC 8.7 7.9 8.8  HGB 9.6* 9.3* 9.1*  HCT 28.5* 28.1* 28.0*  PLT 172 174 174   BMET Recent Labs    08/08/23 1705 08/09/23 0611 08/10/23 0411  NA 139 134* 139  K 3.7 3.8 3.8  CL 108 105 106  CO2 20* 21* 25  GLUCOSE 124* 95 103*  BUN 14 11 10   CREATININE 0.66 0.57 0.56  CALCIUM 7.7* 7.9* 8.5*   LFT Recent Labs    08/08/23 0525  PROT 4.0*  ALBUMIN 2.3*  AST 12*  ALT 12  ALKPHOS 37*  BILITOT 0.6   PT/INR No results for input(s): "LABPROT", "INR" in the last 72 hours. Diagnostic imaging: No results found.  IMPRESSION: Hematochezia, resolved  Normocytic anemia, stable Personal history diverticulosis, suspected cause of above Personal  history colon polyps Hypertension Hyperlipidemia Diabetes mellitus   PLAN: -No further GI bleeding, tolerating diet, Hgb stable  -Ok for diet as tolerated  -Trend H/H, transfuse for Hgb < 7  -[facility] will sign off and be available should any questions or concerns arise   LOS: 2 days   Liliane Shi, [facility]

## 2023-08-10 NOTE — Care Management (Addendum)
Transition of Care [facility]) - Inpatient Brief Assessment   Patient Details  Name: [name] MRN: [id number] Date of Birth: 1946-12-03  [facility]) CM/SW Contact:    Lavenia Atlas, RN Phone Number: 08/10/2023, 6:18 PM   Clinical Narrative: Per chart review patient currently in [facility] ICU for lower GI bleed.    No current TOC needs.    Transition of Care Asessment: Insurance and Status: Insurance coverage has been reviewed Patient has primary care physician: Yes Home environment has been reviewed: from home w/spouse Prior level of function:: independent Prior/Current Home Services: No current home services Social Determinants of Health Reivew: SDOH reviewed no interventions necessary Readmission risk has been reviewed: Yes Transition of care needs: no transition of care needs at this time

## 2023-08-10 NOTE — Progress Notes (Addendum)
Progress Note   Patient: [name] NWG:[id number] DOB: [birth year] DOA: 08/07/2023     2 DOS: the patient was seen and examined on 08/10/2023   Brief hospital course: 77yo with h/o ETOH use disorder, HLD, HTN, carotid stenosis on DAPT, and DM who presented on 8/25 with hematochezia. Hgb 9.3 on presentation, negative CTA for bleeding but with high-grade stenosis of proximal celiac artery, IMA, and L lower pole renal artery as well as moderate stenosis the proximal SMA and R renal arteries. She was admitted to SDU and started on Protonix drip with GI consulting (no plan for scope, follow clinically and repeat CTA if ongoing bleeding). She developed hypotension despite fluid repletion and was transferred to ICU. She was emergently transfused 3 unit PRBCs. She was given desmopressin x 1. Repeat CTA also unremarkable. Started on clear liquids and transferred out of ICU.   Assessment and Plan:  Lower GI Bleeding Patient presented on 8/25 with hematochezia Hemorrhagic shock on admission, resolved Ongoing dark bloody stools with small clots this AM, new vs. Old blood clearing Most likely etiology is diverticular bleeding although she has known stenosis and ischemic colitis is a lesser consideration Admitted to SDU Continue to monitor for recurrent bleeding Repeat CTA done and there was no obvious localized nidus of bleeding, also with blood product in the vaginal vault (resolved by Purewick this AM) GI consulting Continue to monitor CBC Continue IV Protonix q 24 hours   ABLA Hgb still slowly downtrending Transfused 3 units PRBC to date Monitor closely and follow cbc daily, transfuse as necessary for Hbg <8 with h/o ASCVD   PVD Patient with known carotid stenosis and imaging this hospitalization with other multivessel disease She is at increased risk for ischemic colitis but does not have the pain out of proportion to exam to suggest this diagnosis For now, plan for outpatient vascular  f/u GI approved restarting ASA and Plavix today; will start ASA tonight and Plavix tomorrow   Essential hypertension BP low on admission, increasing now Resume home Bystolic, lisinopril   Diabetes mellitus, type 2 (HCC) A1c is 5.8, good control hold Glucophage, Trulicity, Designer, multimedia with moderate-scale SSI    Hyperlipidemia No longer appears to be taking rosuvastatin Has reported statin intolerance   ADHD Hold medication - she will not be performing tasks that require attention and focus as an inpatient      Consultants: GI PCCM   Procedures: None   Antibiotics: None     Subjective: Feeling better. Still some dark blood but ready to advance diet.   Objective: Vitals:   08/10/23 0300 08/10/23 0400  BP: (!) 144/57   Pulse: (!) 56   Resp: 20   Temp:  98.3 F (36.8 C)  SpO2: 92%     Intake/Output Summary (Last 24 hours) at 08/10/2023 0748 Last data filed at 08/10/2023 0700 Gross per 24 hour  Intake --  Output 3300 ml  Net -3300 ml   Filed Weights   08/07/23 0840 08/07/23 1417  Weight: 68 kg 70.2 kg    Exam:  General:  Appears calm and comfortable and is in NAD Eyes:  EOMI, normal lids, iris ENT:  grossly normal hearing, lips & tongue, mmm Neck:  no LAD, masses or thyromegaly Cardiovascular:  RRR, no m/r/g. No LE edema.  Respiratory:   CTA bilaterally with no wheezes/rales/rhonchi.  Normal respiratory effort. Abdomen:  soft, mild but improved lower abdominal pain, ND Skin:  no rash or induration seen on limited exam Musculoskeletal:  grossly normal tone BUE/BLE, good ROM, no bony abnormality Psychiatric:  blunted mood and affect, speech fluent and appropriate, AOx3 Neurologic:  CN 2-12 grossly intact, moves all extremities in coordinated fashion  Data Reviewed: I have reviewed the patient's lab results since admission.  Pertinent labs for today include:  Unremarkable BMP Hgb 9.1, down from 9.6 then 9.3     Family Communication: Husband  was present throughout evaluation  Disposition: Status is: Inpatient Remains inpatient appropriate because: ongoing monitoring  Planned Discharge Destination: Home    Time spent: 50 minutes  Author: Jonah Blue, MD 08/10/2023 7:48 AM  For on call review [email or web address].

## 2023-08-10 NOTE — Hospital Course (Signed)
77yo with h/o ETOH use disorder, HLD, HTN, carotid stenosis on DAPT, and DM who presented on 8/25 with hematochezia. Hgb 9.3 on presentation, negative CTA for bleeding but with high-grade stenosis of proximal celiac artery, IMA, and L lower pole renal artery as well as moderate stenosis the proximal SMA and R renal arteries. She was admitted to SDU and started on Protonix drip with GI consulting (no plan for scope, follow clinically and repeat CTA if ongoing bleeding). She developed hypotension despite fluid repletion and was transferred to ICU. She was emergently transfused 3 unit PRBCs. She was given desmopressin x 1. Repeat CTA also unremarkable. Started on clear liquids and transferred out of ICU.

## 2023-08-11 DIAGNOSIS — K922 Gastrointestinal hemorrhage, unspecified: Secondary | ICD-10-CM | POA: Diagnosis not present

## 2023-08-11 LAB — BPAM RBC
Blood Product Expiration Date: 202409192359
Blood Product Expiration Date: 202409192359
Blood Product Expiration Date: 202409202359
Blood Product Expiration Date: 202409202359
Blood Product Expiration Date: 202409302359
ISSUE DATE / TIME: 202408252019
ISSUE DATE / TIME: 202408260740
ISSUE DATE / TIME: 202408262048
Unit Type and Rh: 6200
Unit Type and Rh: 6200
Unit Type and Rh: 6200
Unit Type and Rh: 6200
Unit Type and Rh: 9500

## 2023-08-11 LAB — TYPE AND SCREEN
ABO/RH(D): A POS
Antibody Screen: NEGATIVE
Unit division: 0
Unit division: 0
Unit division: 0
Unit division: 0
Unit division: 0

## 2023-08-11 LAB — CBC WITH DIFFERENTIAL/PLATELET
Abs Immature Granulocytes: 0.02 10*3/uL (ref 0.00–0.07)
Basophils Absolute: 0 10*3/uL (ref 0.0–0.1)
Basophils Relative: 1 %
Eosinophils Absolute: 0.1 10*3/uL (ref 0.0–0.5)
Eosinophils Relative: 2 %
HCT: 29.3 % — ABNORMAL LOW (ref 36.0–46.0)
Hemoglobin: 9.6 g/dL — ABNORMAL LOW (ref 12.0–15.0)
Immature Granulocytes: 0 %
Lymphocytes Relative: 19 %
Lymphs Abs: 1 10*3/uL (ref 0.7–4.0)
MCH: 28.9 pg (ref 26.0–34.0)
MCHC: 32.8 g/dL (ref 30.0–36.0)
MCV: 88.3 fL (ref 80.0–100.0)
Monocytes Absolute: 0.4 10*3/uL (ref 0.1–1.0)
Monocytes Relative: 8 %
Neutro Abs: 3.9 10*3/uL (ref 1.7–7.7)
Neutrophils Relative %: 70 %
Platelets: 206 10*3/uL (ref 150–400)
RBC: 3.32 MIL/uL — ABNORMAL LOW (ref 3.87–5.11)
RDW: 16.7 % — ABNORMAL HIGH (ref 11.5–15.5)
WBC: 5.5 10*3/uL (ref 4.0–10.5)
nRBC: 0 % (ref 0.0–0.2)

## 2023-08-11 LAB — BASIC METABOLIC PANEL
Anion gap: 8 (ref 5–15)
BUN: 10 mg/dL (ref 8–23)
CO2: 26 mmol/L (ref 22–32)
Calcium: 7.6 mg/dL — ABNORMAL LOW (ref 8.9–10.3)
Chloride: 104 mmol/L (ref 98–111)
Creatinine, Ser: 0.79 mg/dL (ref 0.44–1.00)
GFR, Estimated: >60 mL/min (ref >60)
Glucose, Bld: 177 mg/dL — ABNORMAL HIGH (ref 70–99)
Potassium: 4.1 mmol/L (ref 3.5–5.1)
Sodium: 138 mmol/L (ref 135–145)

## 2023-08-11 LAB — GLUCOSE, CAPILLARY
Glucose-Capillary: 119 mg/dL — ABNORMAL HIGH (ref 70–99)
Glucose-Capillary: 135 mg/dL — ABNORMAL HIGH (ref 70–99)

## 2023-08-11 NOTE — Discharge Summary (Signed)
Physician Discharge Summary   Patient: [name] MRN: [id number] DOB: Aug 31, 1946  Admit date:     08/07/2023  Discharge date: 08/11/23  Discharge Physician: Jonah Blue   PCP: Marguarite Arbour, MD   Recommendations at discharge:   Resume ASA and Plavix daily Continue to monitor for recurrent bleeding F/u with GI - call Dr. Horace Porteous office for an appointment F/u with Dr. Wyn Quaker from vascular - call for an appointment F/u with PCP in 1-2 weeks for repeat CBC, recheck  Discharge Diagnoses: Principal Problem:   Lower GI bleed Active Problems:   Hyperlipidemia   Essential hypertension   Essential hypertension, benign   Carotid artery stenosis   Diabetes mellitus, type 2 (HCC)   Acute blood loss anemia (ABLA)   Hemorrhagic shock [facility])    Hospital Course: 77yo with h/o ETOH use disorder, HLD, HTN, carotid stenosis on DAPT, and DM who presented on 8/25 with hematochezia. Hgb 9.3 on presentation, negative CTA for bleeding but with high-grade stenosis of proximal celiac artery, IMA, and L lower pole renal artery as well as moderate stenosis the proximal SMA and R renal arteries. She was admitted to SDU and started on Protonix drip with GI consulting (no plan for scope, follow clinically and repeat CTA if ongoing bleeding). She developed hypotension despite fluid repletion and was transferred to ICU. She was emergently transfused 3 unit PRBCs. She was given desmopressin x 1. Repeat CTA also unremarkable. Started on clear liquids and transferred out of ICU.   Assessment and Plan:  Lower GI Bleeding Patient presented on 8/25 with hematochezia Hemorrhagic shock on admission, resolved Ongoing dark bloody stools with small clots this AM, new vs. Old blood clearing Most likely etiology is diverticular bleeding although she has known stenosis and ischemic colitis was a lesser consideration Admitted to SDU -> floor Repeat CTA done and there was no obvious localized nidus of bleeding,  also with blood product in the vaginal vault (resolved by Purewick) GI consulted and has signed off Continue daily PPI Stabilized Hgb without further bleeding DC to home today F/u with Dr. Wendi Maya Transfused 3 units PRBC to date Transfuse as necessary for Hbg <8 with h/o ASCVD Hgb appears to have stabilized   PVD Patient with known carotid stenosis and imaging this hospitalization with other multivessel disease She is at increased risk for ischemic colitis but does not have the pain out of proportion to exam to suggest this diagnosis For now, plan for outpatient vascular f/u GI approved restarting ASA and Plavix    Essential hypertension BP low on admission, normalized Resume home Bystolic, lisinopril   Diabetes mellitus, type 2 (HCC) A1c is 5.8, good control Resume home Glucophage, Trulicity, Jardiance   Hyperlipidemia No longer appears to be taking rosuvastatin Has reported statin intolerance   ADHD Held medication - she did not perform tasks that require attention and focus as an inpatient  Resume as needed        Consultants: GI PCCM   Procedures: None   Antibiotics: None      Disposition: Home Diet recommendation:  Cardiac and Carb modified diet DISCHARGE MEDICATION: Allergies as of 08/11/2023       Reactions   Latex Hives   Lipitor [atorvastatin Calcium] Other (See Comments)   Muscle aches   Codeine Rash   And nausea   Pentazocine Lactate Rash   Sulfonamide Derivatives Rash        Medication List     TAKE these medications  accu-chek multiclix lancets Use to test blood sugar 3 times daily as instructed. Dx: E11.65   amphetamine-dextroamphetamine 20 MG 24 hr capsule Commonly known as: ADDERALL XR Take 40 mg by mouth every morning.   aspirin EC 81 MG tablet Take 1 tablet (81 mg total) by mouth daily. Swallow whole.   Bystolic 5 MG tablet Generic drug: nebivolol TAKE 1 TABLET DAILY What changed: how much to take    clopidogrel 75 MG tablet Commonly known as: PLAVIX TAKE 1 TABLET BY MOUTH EVERY DAY   diphenoxylate-atropine 2.5-0.025 MG tablet Commonly known as: LOMOTIL Take 1 tablet by mouth 4 (four) times daily as needed for diarrhea or loose stools.   EPINEPHrine 0.3 mg/0.3 mL Soaj injection Commonly known as: EPI-PEN Inject 0.3 mg into the muscle as needed for anaphylaxis.   glucose blood test strip Commonly known as: Bayer Contour Next Test Use to test blood sugar 3 times daily as instructed. Dx code: E11.65   Jardiance 25 MG Tabs tablet Generic drug: empagliflozin Take 25 mg by mouth daily.   lisinopril 5 MG tablet Commonly known as: ZESTRIL TAKE 1 TABLET BY MOUTH EVERY DAY   LORazepam 0.5 MG tablet Commonly known as: Ativan Take 1 tablet (0.5 mg total) by mouth daily as needed for anxiety.   metFORMIN 1000 MG tablet Commonly known as: GLUCOPHAGE Take 1,000 mg by mouth every evening.   omeprazole 40 MG capsule Commonly known as: PRILOSEC Take 40 mg by mouth daily.   pregabalin 300 MG capsule Commonly known as: LYRICA Take 300 mg by mouth 2 (two) times daily.   Trulicity 3 MG/0.5ML Sopn Generic drug: Dulaglutide Inject 3 mg into the skin once a week.        Discharge Exam: Filed Weights   08/07/23 0840 08/07/23 1417  Weight: 68 kg 70.2 kg     Subjective: She is feeling better.  Has been up and around room without difficulty.  No further BMs.  Started back on ASA last evening and Plavix today.  She reports that she IS going home today.     Objective: Vitals:   08/11/23 0334 08/11/23 1149  BP: (!) 159/59 (!) 136/57  Pulse: (!) 57 69  Resp: 18 16  Temp: 98.3 F (36.8 C) 98.4 F (36.9 C)  SpO2: 100% 96%    Intake/Output Summary (Last 24 hours) at 08/11/2023 1324 Last data filed at 08/11/2023 0341 Gross per 24 hour  Intake 180 ml  Output 1500 ml  Net -1320 ml   Filed Weights   08/07/23 0840 08/07/23 1417  Weight: 68 kg 70.2 kg    Exam:  General:   Appears calm and comfortable and is in NAD Eyes:  EOMI, normal lids, iris ENT:  grossly normal hearing, lips & tongue, mmm Neck:  no LAD, masses or thyromegaly Cardiovascular:  RRR, no m/r/g. No LE edema.  Respiratory:   CTA bilaterally with no wheezes/rales/rhonchi.  Normal respiratory effort. Abdomen:  soft, no significant abdominal TTP, ND Skin:  no rash or induration seen on limited exam Musculoskeletal:  grossly normal tone BUE/BLE, good ROM, no bony abnormality Psychiatric:  blunted mood and affect, speech fluent and appropriate, AOx3 Neurologic:  CN 2-12 grossly intact, moves all extremities in coordinated fashion  Data Reviewed: I have reviewed the patient's lab results since admission.  Pertinent labs for today include:  Glucose 177 Hgb 9.6, up from 9.2     Condition at discharge: stable  The results of significant diagnostics from this hospitalization (including imaging,  microbiology, ancillary and laboratory) are listed below for reference.   Imaging Studies: CT ANGIO GI BLEED  Result Date: 08/08/2023 CLINICAL DATA:  Worsening rectal bleeding EXAM: CTA ABDOMEN AND PELVIS WITHOUT AND WITH CONTRAST TECHNIQUE: Multidetector CT imaging of the abdomen and pelvis was performed using the standard protocol during bolus administration of intravenous contrast. Multiplanar reconstructed images and MIPs were obtained and reviewed to evaluate the vascular anatomy. RADIATION DOSE REDUCTION: This exam was performed according to the departmental dose-optimization program which includes automated exposure control, adjustment of the mA and/or kV according to patient size and/or use of iterative reconstruction technique. CONTRAST:  OMNIPAQUE IOHEXOL 350 MG/ML SOLN COMPARISON:  08/07/2023 FINDINGS: VASCULAR Normal contour and caliber of the abdominal aorta. Severe mixed calcific atherosclerosis. No evidence of aneurysm, dissection, or other acute aortic pathology. Standard branching pattern  of the abdominal aorta with solitary bilateral renal arteries. Multifocal branch vessel stenosis as detailed by previous day's examination, particularly with significant stenoses of the celiac, superior mesenteric, right renal artery, and inferior mesenteric artery origins. Review of the MIP images confirms the above findings. NON-VASCULAR Lower Chest: No acute findings. Hepatobiliary: No solid liver abnormality is seen. Excreted contrast in the gallbladder. No gallstones, gallbladder wall thickening, or biliary dilatation. Pancreas: Unremarkable. No pancreatic ductal dilatation or surrounding inflammatory changes. Spleen: Normal in size without significant abnormality. Adrenals/Urinary Tract: Adrenal glands are unremarkable. Kidneys are normal, without renal calculi, solid lesion, or hydronephrosis. Excreted contrast in the urinary bladder. Stomach/Bowel: Stomach is within normal limits. Appendix appears normal. No evidence of bowel wall thickening, distention, or inflammatory changes. Sigmoid diverticulosis. Admixed stool and contrast scattered throughout the colon and rectum, without evidence of intraluminal extravasation on contrast phases. Lymphatic: No enlarged abdominal or pelvic lymph nodes. Reproductive: No mass. Blood product within the vaginal vault (series 11, image 103). Other: No abdominal wall hernia or abnormality. No ascites. Musculoskeletal: No acute osseous findings. IMPRESSION: 1. Admixed stool and contrast scattered throughout the colon and rectum, without evidence of intraluminal extravasation on contrast phases to specifically localize nidus of GI bleeding. 2. Blood product within the vaginal vault of uncertain origin, again without visible extravasation. 3. Sigmoid diverticulosis without evidence of acute diverticulitis. 4. Severe mixed calcific atherosclerosis. Multifocal aortic branch vessel stenosis as detailed by previous day's examination, particularly with significant stenoses of the  celiac, superior mesenteric, right renal artery, and inferior mesenteric artery origins. Aortic Atherosclerosis (ICD10-I70.0). Electronically Signed   By: Jearld Lesch M.D.   On: 08/08/2023 11:15   [place] EKG SITE RITE  Result Date: 08/07/2023 If Site Rite image not attached, placement could not be confirmed due to current cardiac rhythm.  CT ANGIO GI BLEED  Result Date: 08/07/2023 CLINICAL DATA:  Lower GI bleed EXAM: CTA ABDOMEN AND PELVIS WITHOUT AND WITH CONTRAST TECHNIQUE: Multidetector CT imaging of the abdomen and pelvis was performed using the standard protocol during bolus administration of intravenous contrast. Multiplanar reconstructed images and MIPs were obtained and reviewed to evaluate the vascular anatomy. RADIATION DOSE REDUCTION: This exam was performed according to the departmental dose-optimization program which includes automated exposure control, adjustment of the mA and/or kV according to patient size and/or use of iterative reconstruction technique. CONTRAST:  OMNIPAQUE IOHEXOL 350 MG/ML SOLN COMPARISON:  Abdominal ultrasound 01/24/2016. CT of the abdomen and pelvis with contrast 08/28/2014. FINDINGS: VASCULAR Aorta: Diffuse atherosclerotic changes are present throughout the abdominal aorta. No aneurysm or focal stenosis is present. 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 The  lumen is narrowed to 1 mm compared with a more normal distal segment at 5.7 mm. The more distal branch vessels are within normal limits. SMA: Atherosclerotic calcifications are present at the ostium. The lumen slightly more distal is narrowed to 2 mm. This compares with 5.7 mm slightly more distal. The branch vessels are within normal limits. Renals: Atherosclerotic changes are present at the ostium of the right renal artery. A 50% stenosis is present. High-grade stenosis is present in the left lower pole renal artery. The upper pole renal artery is unremarkable. IMA: A  high-grade stenosis is present at the ostium of the IMA. More distal branch vessels are within normal limits. Inflow: Atherosclerotic calcifications scratched at atherosclerotic changes are present in the proximal iliac arteries bilaterally without a significant stenosis. Proximal Outflow: Bilateral common femoral and visualized portions of the superficial and profunda femoral arteries are patent without evidence of aneurysm, dissection, vasculitis or significant stenosis. Veins: No obvious venous abnormality within the limitations of this arterial phase study. Review of the MIP images confirms the above findings. NON-VASCULAR Lower chest: Mild dependent fibrotic change or scarring is present. The lungs are otherwise clear. The heart size is normal. No significant pleural or pericardial effusion is present. Hepatobiliary: No focal liver abnormality is seen. No gallstones, gallbladder wall thickening, or biliary dilatation. Pancreas: Unremarkable. No pancreatic ductal dilatation or surrounding inflammatory changes. Spleen: Normal in size without focal abnormality. Adrenals/Urinary Tract: Adrenal glands are normal bilaterally. A simple cyst anteriorly in the right kidney measures 16 mm. No other focal lesions are present. No stone or obstruction is present. Ureters are within normal limits bilaterally. Mild diffuse wall thickening is present in the urinary bladder. No discrete lesions are present. Stomach/Bowel: The stomach and duodenum are within normal limits. Small bowel is unremarkable. Terminal ileum is within normal limits. The appendix is visualized and normal. The ascending and transverse colon are within normal limits. The descending and sigmoid colon are unremarkable. Lymphatic: No significant abdominal adenopathy is present. Reproductive: Uterus and bilateral adnexa are unremarkable. Other: No abdominal wall hernia or abnormality. No abdominopelvic ascites. Musculoskeletal: Degenerative retrolisthesis at  L1-2 measures 6 mm. No other significant listhesis is present. Bony pelvis is normal. The hips are located and within normal limits bilaterally. IMPRESSION: 1. No acute or focal lesion to explain the patient's lower GI bleed. No arterial enhancement within the bowel. 2. 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 3. Moderate stenosis of the proximal SMA. 4. High-grade stenosis of the IMA. 5. 50% stenosis of the right renal artery. 6. High-grade stenosis of the left lower pole renal artery. 7. 16 mm simple cyst in the right kidney. No follow-up imaging is recommended. JACR 2018 Feb; 264-273, Management of the Incidental Renal Mass on CT, [employer] 2021; 814-848, Bosniak Classification of Cystic Renal Masses, Version 2019. 8. Degenerative retrolisthesis at L1-2 measures 6 mm. 9. Mild diffuse wall thickening in the urinary bladder is nonspecific, but can be seen in the setting of cystitis. 10.  Aortic Atherosclerosis (ICD10-I70.0). Electronically Signed   By: Marin Roberts M.D.   On: 08/07/2023 11:27   VAS US CAROTID  Result Date: 07/27/2023 Carotid Arterial Duplex Study Patient Name:  JONETTA DEBOCK  Date of Exam:   07/26/2023 Medical Rec #: [id number]        Accession #:    [id number] Date of Birth: 10/21/1946        Patient Gender: F Patient Age:   60 years Exam Location:  [facility]  Procedure:      VAS US CAROTID Referring Phys: Barbara Cower DEW --------------------------------------------------------------------------------  Indications:       Carotid artery disease. Other Factors:     08/07/2020 Left carotid artery stent. Comparison Study:  07/27/2022 Performing Technologist: Debbe Bales RVS  Examination Guidelines: A complete evaluation includes B-mode imaging, spectral Doppler, color Doppler, and power Doppler as needed of all accessible portions of each vessel. Bilateral testing is considered an integral part of a complete examination. Limited examinations for reoccurring  indications may be performed as noted.  Right Carotid Findings: +----------+-------+-------+--------+---------------------------------+--------+           PSV    EDV    StenosisPlaque Description               Comments           cm/s   cm/s                                                     +----------+-------+-------+--------+---------------------------------+--------+ CCA Prox  79     15             homogeneous, smooth and calcific          +----------+-------+-------+--------+---------------------------------+--------+ CCA Mid   108    22             homogeneous, calcific and                                                 irregular                                 +----------+-------+-------+--------+---------------------------------+--------+ CCA Distal114    25                                                       +----------+-------+-------+--------+---------------------------------+--------+ ICA Prox  127    17             homogeneous, smooth and calcific          +----------+-------+-------+--------+---------------------------------+--------+ ICA Mid   135    24                                                       +----------+-------+-------+--------+---------------------------------+--------+ ICA Distal78     24                                                       +----------+-------+-------+--------+---------------------------------+--------+ ECA       187    0              irregular, calcific and  heterogenous                              +----------+-------+-------+--------+---------------------------------+--------+ +----------+--------+-------+--------+-------------------+           PSV cm/sEDV cmsDescribeArm Pressure (mmHG) +----------+--------+-------+--------+-------------------+ [id number]     0                                   +----------+--------+-------+--------+-------------------+ +---------+--------+--+--------+--+ VertebralPSV cm/s59EDV cm/s14 +---------+--------+--+--------+--+  Left Carotid Findings: +----------+--------+--------+--------+------------------+--------+           PSV cm/sEDV cm/sStenosisPlaque DescriptionComments +----------+--------+--------+--------+------------------+--------+ CCA Prox  118     22                                         +----------+--------+--------+--------+------------------+--------+ CCA Mid   104     21                                         +----------+--------+--------+--------+------------------+--------+ CCA Distal109     25                                         +----------+--------+--------+--------+------------------+--------+ ICA Prox  104     21                                Stent    +----------+--------+--------+--------+------------------+--------+ ICA Mid   90      23                                         +----------+--------+--------+--------+------------------+--------+ ICA Distal102     28                                         +----------+--------+--------+--------+------------------+--------+ ECA       130     0                                          +----------+--------+--------+--------+------------------+--------+ +----------+--------+--------+--------+-------------------+           PSV cm/sEDV cm/sDescribeArm Pressure (mmHG) +----------+--------+--------+--------+-------------------+ [id number]     0                                   +----------+--------+--------+--------+-------------------+ +---------+--------+--+--------+-+ VertebralPSV cm/s81EDV cm/s0 +---------+--------+--+--------+-+   Summary: Right Carotid: Velocities in the right ICA are consistent with a 40-59%                stenosis. Left Carotid: Velocities in the left ICA are consistent with a 1-39% stenosis.  Vertebrals:  Bilateral vertebral arteries demonstrate antegrade flow. Subclavians: Normal flow hemodynamics were seen in bilateral subclavian  arteries. *See table(s) above for measurements and observations.  Electronically signed by Festus Barren MD on 07/27/2023 at 3:20:25 PM.    Final     Microbiology: Results for orders placed or performed during the hospital encounter of 08/07/23  MRSA Next Gen by PCR, Nasal     Status: Abnormal   Collection Time: 08/07/23  2:47 PM   Specimen: Nasal Mucosa; Nasal Swab  Result Value Ref Range Status   MRSA by PCR Next Gen DETECTED (A) NOT DETECTED Final    Comment: (NOTE) The GeneXpert MRSA Assay (FDA approved for NASAL specimens only), is one component of a comprehensive MRSA colonization surveillance program. It is not intended to diagnose MRSA infection nor to guide or monitor treatment for MRSA infections. Test performance is not FDA approved in patients less than 44 years old. Performed at [facility], [address], [place], [place] [address]     Labs: CBC: Recent Labs  Lab 08/08/23 0525 08/08/23 1105 08/09/23 0611 08/09/23 0922 08/09/23 1810 08/10/23 0411 08/11/23 1005  WBC 13.4*   < > 8.7 8.7 7.9 8.8 5.5  NEUTROABS 10.1*  --   --   --  5.3 6.4 3.9  HGB 6.8*   < > 8.9* 9.6* 9.3* 9.1* 9.6*  HCT 21.1*   < > 26.7* 28.5* 28.1* 28.0* 29.3*  MCV 84.7   < > 86.4 85.8 85.4 86.2 88.3  PLT 201   < > 154 172 174 174 206   < > = values in this interval not displayed.   Basic Metabolic Panel: Recent Labs  Lab 08/08/23 0525 08/08/23 1705 08/09/23 0611 08/10/23 0411 08/11/23 1005  NA 137 139 134* 139 138  K 3.9 3.7 3.8 3.8 4.1  CL 114* 108 105 106 104  CO2 20* 20* 21* 25 26  GLUCOSE 163* 124* 95 103* 177*  BUN 26* 14 11 10 10   CREATININE 0.78 0.66 0.57 0.56 0.79  CALCIUM 6.7* 7.7* 7.9* 8.5* 7.6*  MG 1.6*  --  2.0  --   --   PHOS 3.8  --   --   --   --    Liver Function Tests: Recent Labs  Lab  08/07/23 0919 08/08/23 0525  AST 26 12*  ALT 11 12  ALKPHOS 60 37*  BILITOT 0.6 0.6  PROT 6.2* 4.0*  ALBUMIN 3.3* 2.3*   CBG: Recent Labs  Lab 08/10/23 1148 08/10/23 1822 08/10/23 2154 08/11/23 0726 08/11/23 1150  GLUCAP 98 94 124* 119* 135*    Discharge time spent: greater than 30 minutes.  Signed: Jonah Blue, MD [facility] 08/11/2023

## 2023-08-11 NOTE — Plan of Care (Signed)
  Problem: Education: Goal: Knowledge of General Education information will improve Description: Including pain rating scale, medication(s)/side effects and non-pharmacologic comfort measures Outcome: Progressing   Problem: Clinical Measurements: Goal: Diagnostic test results will improve Outcome: Progressing   Problem: Activity: Goal: Risk for activity intolerance will decrease Outcome: Progressing   Problem: Pain Managment: Goal: General experience of comfort will improve Outcome: Progressing   

## 2023-08-11 NOTE — Care Management Important Message (Signed)
Important Message  Patient Details IM Letter given. Name: [name] MRN: [id number] Date of Birth: 1946/09/19   Medicare Important Message Given:  Yes     Caren Macadam 08/11/2023, 9:46 AM

## 2023-09-03 ENCOUNTER — Other Ambulatory Visit (HOSPITAL_BASED_OUTPATIENT_CLINIC_OR_DEPARTMENT_OTHER): Payer: Self-pay

## 2023-09-09 ENCOUNTER — Inpatient Hospital Stay (HOSPITAL_COMMUNITY): Payer: Medicare HMO

## 2023-09-09 ENCOUNTER — Other Ambulatory Visit: Payer: Self-pay

## 2023-09-09 ENCOUNTER — Emergency Department (HOSPITAL_COMMUNITY): Payer: Medicare HMO

## 2023-09-09 ENCOUNTER — Encounter (HOSPITAL_COMMUNITY): Payer: Self-pay | Admitting: Emergency Medicine

## 2023-09-09 ENCOUNTER — Inpatient Hospital Stay (HOSPITAL_COMMUNITY)
Admission: EM | Admit: 2023-09-09 | Discharge: 2023-09-20 | DRG: 068 | Disposition: A | Discharge status: Home / Self Care | Payer: Medicare HMO | Attending: Family Medicine | Admitting: Family Medicine

## 2023-09-09 DIAGNOSIS — R1310 Dysphagia, unspecified: Secondary | ICD-10-CM | POA: Diagnosis present

## 2023-09-09 DIAGNOSIS — I672 Cerebral atherosclerosis: Secondary | ICD-10-CM | POA: Diagnosis present

## 2023-09-09 DIAGNOSIS — Z7902 Long term (current) use of antithrombotics/antiplatelets: Secondary | ICD-10-CM

## 2023-09-09 DIAGNOSIS — I10 Essential (primary) hypertension: Secondary | ICD-10-CM | POA: Diagnosis present

## 2023-09-09 DIAGNOSIS — Z79899 Other long term (current) drug therapy: Secondary | ICD-10-CM | POA: Diagnosis not present

## 2023-09-09 DIAGNOSIS — Z7982 Long term (current) use of aspirin: Secondary | ICD-10-CM

## 2023-09-09 DIAGNOSIS — Z7985 Long-term (current) use of injectable non-insulin antidiabetic drugs: Secondary | ICD-10-CM | POA: Diagnosis not present

## 2023-09-09 DIAGNOSIS — Z9889 Other specified postprocedural states: Secondary | ICD-10-CM | POA: Diagnosis not present

## 2023-09-09 DIAGNOSIS — M797 Fibromyalgia: Secondary | ICD-10-CM | POA: Diagnosis present

## 2023-09-09 DIAGNOSIS — E860 Dehydration: Secondary | ICD-10-CM | POA: Diagnosis present

## 2023-09-09 DIAGNOSIS — Z9049 Acquired absence of other specified parts of digestive tract: Secondary | ICD-10-CM

## 2023-09-09 DIAGNOSIS — I639 Cerebral infarction, unspecified: Secondary | ICD-10-CM

## 2023-09-09 DIAGNOSIS — A0472 Enterocolitis due to Clostridium difficile, not specified as recurrent: Secondary | ICD-10-CM | POA: Diagnosis present

## 2023-09-09 DIAGNOSIS — I651 Occlusion and stenosis of basilar artery: Principal | ICD-10-CM | POA: Diagnosis present

## 2023-09-09 DIAGNOSIS — E876 Hypokalemia: Secondary | ICD-10-CM | POA: Diagnosis not present

## 2023-09-09 DIAGNOSIS — F17290 Nicotine dependence, other tobacco product, uncomplicated: Secondary | ICD-10-CM | POA: Diagnosis present

## 2023-09-09 DIAGNOSIS — F17210 Nicotine dependence, cigarettes, uncomplicated: Secondary | ICD-10-CM | POA: Diagnosis present

## 2023-09-09 DIAGNOSIS — N3080 Other cystitis without hematuria: Secondary | ICD-10-CM | POA: Diagnosis present

## 2023-09-09 DIAGNOSIS — E119 Type 2 diabetes mellitus without complications: Secondary | ICD-10-CM | POA: Diagnosis not present

## 2023-09-09 DIAGNOSIS — I6521 Occlusion and stenosis of right carotid artery: Secondary | ICD-10-CM | POA: Diagnosis present

## 2023-09-09 DIAGNOSIS — B961 Klebsiella pneumoniae [K. pneumoniae] as the cause of diseases classified elsewhere: Secondary | ICD-10-CM | POA: Diagnosis present

## 2023-09-09 DIAGNOSIS — F419 Anxiety disorder, unspecified: Secondary | ICD-10-CM | POA: Diagnosis present

## 2023-09-09 DIAGNOSIS — G459 Transient cerebral ischemic attack, unspecified: Secondary | ICD-10-CM | POA: Diagnosis not present

## 2023-09-09 DIAGNOSIS — F1011 Alcohol abuse, in remission: Secondary | ICD-10-CM | POA: Diagnosis present

## 2023-09-09 DIAGNOSIS — R933 Abnormal findings on diagnostic imaging of other parts of digestive tract: Secondary | ICD-10-CM

## 2023-09-09 DIAGNOSIS — K529 Noninfective gastroenteritis and colitis, unspecified: Secondary | ICD-10-CM

## 2023-09-09 DIAGNOSIS — E1165 Type 2 diabetes mellitus with hyperglycemia: Secondary | ICD-10-CM | POA: Diagnosis present

## 2023-09-09 DIAGNOSIS — Z8249 Family history of ischemic heart disease and other diseases of the circulatory system: Secondary | ICD-10-CM

## 2023-09-09 DIAGNOSIS — Z7984 Long term (current) use of oral hypoglycemic drugs: Secondary | ICD-10-CM

## 2023-09-09 DIAGNOSIS — I63532 Cerebral infarction due to unspecified occlusion or stenosis of left posterior cerebral artery: Principal | ICD-10-CM

## 2023-09-09 DIAGNOSIS — E785 Hyperlipidemia, unspecified: Secondary | ICD-10-CM | POA: Diagnosis present

## 2023-09-09 DIAGNOSIS — Z888 Allergy status to other drugs, medicaments and biological substances status: Secondary | ICD-10-CM

## 2023-09-09 DIAGNOSIS — Z882 Allergy status to sulfonamides status: Secondary | ICD-10-CM

## 2023-09-09 DIAGNOSIS — Z8701 Personal history of pneumonia (recurrent): Secondary | ICD-10-CM

## 2023-09-09 DIAGNOSIS — Z885 Allergy status to narcotic agent status: Secondary | ICD-10-CM

## 2023-09-09 DIAGNOSIS — Z8719 Personal history of other diseases of the digestive system: Secondary | ICD-10-CM | POA: Diagnosis not present

## 2023-09-09 DIAGNOSIS — Z91040 Latex allergy status: Secondary | ICD-10-CM

## 2023-09-09 DIAGNOSIS — Z860101 Personal history of adenomatous and serrated colon polyps: Secondary | ICD-10-CM

## 2023-09-09 DIAGNOSIS — Z87440 Personal history of urinary (tract) infections: Secondary | ICD-10-CM

## 2023-09-09 HISTORY — PX: IR ANGIO INTRA EXTRACRAN SEL COM CAROTID INNOMINATE BILAT MOD SED: IMG5360

## 2023-09-09 HISTORY — PX: IR ANGIO VERTEBRAL SEL VERTEBRAL BILAT MOD SED: IMG5369

## 2023-09-09 LAB — I-STAT CHEM 8, ED
BUN: 23 mg/dL (ref 8–23)
Calcium, Ion: 0.98 mmol/L — ABNORMAL LOW (ref 1.15–1.40)
Chloride: 106 mmol/L (ref 98–111)
Creatinine, Ser: 1.2 mg/dL — ABNORMAL HIGH (ref 0.44–1.00)
Glucose, Bld: 178 mg/dL — ABNORMAL HIGH (ref 70–99)
HCT: 31 % — ABNORMAL LOW (ref 36.0–46.0)
Hemoglobin: 10.5 g/dL — ABNORMAL LOW (ref 12.0–15.0)
Potassium: 4.6 mmol/L (ref 3.5–5.1)
Sodium: 135 mmol/L (ref 135–145)
TCO2: 18 mmol/L — ABNORMAL LOW (ref 22–32)

## 2023-09-09 LAB — URINALYSIS, W/ REFLEX TO CULTURE (INFECTION SUSPECTED)
Bacteria, UA: NONE SEEN
Bilirubin Urine: NEGATIVE
Glucose, UA: >=500 mg/dL — AB
Ketones, ur: NEGATIVE mg/dL
Nitrite: NEGATIVE
Protein, ur: NEGATIVE mg/dL
Specific Gravity, Urine: 1.035 — ABNORMAL HIGH (ref 1.005–1.030)
WBC, UA: >50 WBC/hpf (ref 0–5)
pH: 5 (ref 5.0–8.0)

## 2023-09-09 LAB — CBC WITH DIFFERENTIAL/PLATELET
Abs Immature Granulocytes: 0.17 10*3/uL — ABNORMAL HIGH (ref 0.00–0.07)
Basophils Absolute: 0.1 10*3/uL (ref 0.0–0.1)
Basophils Relative: 0 %
Eosinophils Absolute: 0 10*3/uL (ref 0.0–0.5)
Eosinophils Relative: 0 %
HCT: 33 % — ABNORMAL LOW (ref 36.0–46.0)
Hemoglobin: 9.8 g/dL — ABNORMAL LOW (ref 12.0–15.0)
Immature Granulocytes: 1 %
Lymphocytes Relative: 10 %
Lymphs Abs: 1.4 10*3/uL (ref 0.7–4.0)
MCH: 28.1 pg (ref 26.0–34.0)
MCHC: 29.7 g/dL — ABNORMAL LOW (ref 30.0–36.0)
MCV: 94.6 fL (ref 80.0–100.0)
Monocytes Absolute: 0.9 10*3/uL (ref 0.1–1.0)
Monocytes Relative: 7 %
Neutro Abs: 11.4 10*3/uL — ABNORMAL HIGH (ref 1.7–7.7)
Neutrophils Relative %: 82 %
Platelets: 356 10*3/uL (ref 150–400)
RBC: 3.49 MIL/uL — ABNORMAL LOW (ref 3.87–5.11)
RDW: 19.8 % — ABNORMAL HIGH (ref 11.5–15.5)
WBC: 13.9 10*3/uL — ABNORMAL HIGH (ref 4.0–10.5)
nRBC: 0 % (ref 0.0–0.2)

## 2023-09-09 LAB — RAPID URINE DRUG SCREEN, HOSP PERFORMED
Amphetamines: POSITIVE — AB
Barbiturates: NOT DETECTED
Benzodiazepines: POSITIVE — AB
Cocaine: NOT DETECTED
Opiates: POSITIVE — AB
Tetrahydrocannabinol: POSITIVE — AB

## 2023-09-09 LAB — ETHANOL: Alcohol, Ethyl (B): <10 mg/dL (ref <10)

## 2023-09-09 LAB — COMPREHENSIVE METABOLIC PANEL
ALT: 14 U/L (ref 0–44)
AST: 20 U/L (ref 15–41)
Albumin: 3 g/dL — ABNORMAL LOW (ref 3.5–5.0)
Alkaline Phosphatase: 63 U/L (ref 38–126)
Anion gap: 18 — ABNORMAL HIGH (ref 5–15)
BUN: 23 mg/dL (ref 8–23)
CO2: 17 mmol/L — ABNORMAL LOW (ref 22–32)
Calcium: 8.5 mg/dL — ABNORMAL LOW (ref 8.9–10.3)
Chloride: 102 mmol/L (ref 98–111)
Creatinine, Ser: 1.17 mg/dL — ABNORMAL HIGH (ref 0.44–1.00)
GFR, Estimated: 49 mL/min — ABNORMAL LOW (ref >60)
Glucose, Bld: 178 mg/dL — ABNORMAL HIGH (ref 70–99)
Potassium: 4.4 mmol/L (ref 3.5–5.1)
Sodium: 137 mmol/L (ref 135–145)
Total Bilirubin: 0.3 mg/dL (ref 0.3–1.2)
Total Protein: 5.4 g/dL — ABNORMAL LOW (ref 6.5–8.1)

## 2023-09-09 LAB — LIPID PANEL
Cholesterol: 214 mg/dL — ABNORMAL HIGH (ref 0–200)
HDL: 37 mg/dL — ABNORMAL LOW (ref >40)
LDL Cholesterol: 117 mg/dL — ABNORMAL HIGH (ref 0–99)
Total CHOL/HDL Ratio: 5.8 {ratio}
Triglycerides: 299 mg/dL — ABNORMAL HIGH (ref <150)
VLDL: 60 mg/dL — ABNORMAL HIGH (ref 0–40)

## 2023-09-09 LAB — TROPONIN I (HIGH SENSITIVITY)
Troponin I (High Sensitivity): 6 ng/L (ref <18)
Troponin I (High Sensitivity): 4 ng/L (ref <18)

## 2023-09-09 LAB — HEMOGLOBIN A1C
Hgb A1c MFr Bld: 5.7 % — ABNORMAL HIGH (ref 4.8–5.6)
Mean Plasma Glucose: 116.89 mg/dL

## 2023-09-09 LAB — LACTIC ACID, PLASMA
Lactic Acid, Venous: 0.8 mmol/L (ref 0.5–1.9)
Lactic Acid, Venous: 1.5 mmol/L (ref 0.5–1.9)

## 2023-09-09 LAB — PROCALCITONIN: Procalcitonin: 0.4 ng/mL

## 2023-09-09 LAB — PROTIME-INR
INR: 1 (ref 0.8–1.2)
Prothrombin Time: 13.1 s (ref 11.4–15.2)

## 2023-09-09 LAB — GLUCOSE, CAPILLARY: Glucose-Capillary: 119 mg/dL — ABNORMAL HIGH (ref 70–99)

## 2023-09-09 LAB — SEDIMENTATION RATE: Sed Rate: 22 mm/h (ref 0–22)

## 2023-09-09 LAB — APTT: aPTT: 22 s — ABNORMAL LOW (ref 24–36)

## 2023-09-09 LAB — LIPASE, BLOOD: Lipase: 42 U/L (ref 11–51)

## 2023-09-09 LAB — C-REACTIVE PROTEIN: CRP: 2 mg/dL — ABNORMAL HIGH (ref <1.0)

## 2023-09-09 MED ORDER — IOHEXOL 350 MG/ML SOLN
75.0000 mL | Freq: Once | INTRAVENOUS | Status: AC | PRN
  Administered 2023-09-09: 75 mL via INTRAVENOUS

## 2023-09-09 MED ORDER — SODIUM CHLORIDE 0.9 % IV BOLUS
500.0000 mL | Freq: Once | INTRAVENOUS | Status: AC
  Administered 2023-09-09: 500 mL via INTRAVENOUS

## 2023-09-09 MED ORDER — LACTATED RINGERS IV SOLN: INTRAVENOUS | Status: DC

## 2023-09-09 MED ORDER — FENTANYL CITRATE (PF) 100 MCG/2ML IJ SOLN
INTRAMUSCULAR | Status: AC | PRN
Start: 2023-09-09 — End: 2023-09-09
  Administered 2023-09-09: 25 ug via INTRAVENOUS

## 2023-09-09 MED ORDER — MIDAZOLAM HCL 2 MG/2ML IJ SOLN
INTRAMUSCULAR | Status: AC
  Filled 2023-09-09: qty 2

## 2023-09-09 MED ORDER — SODIUM CHLORIDE 0.9 % IV SOLN: INTRAVENOUS | Status: AC

## 2023-09-09 MED ORDER — HYDROMORPHONE HCL 1 MG/ML IJ SOLN
1.0000 mg | INTRAMUSCULAR | Status: DC | PRN
  Administered 2023-09-09 – 2023-09-14 (×16): 1 mg via INTRAVENOUS
  Filled 2023-09-09 (×17): qty 1

## 2023-09-09 MED ORDER — IOHEXOL 300 MG/ML  SOLN
150.0000 mL | Freq: Once | INTRAMUSCULAR | Status: AC | PRN
  Administered 2023-09-09: 128 mL via INTRA_ARTERIAL

## 2023-09-09 MED ORDER — ONDANSETRON HCL 4 MG/2ML IJ SOLN
4.0000 mg | Freq: Once | INTRAMUSCULAR | Status: AC
  Administered 2023-09-09: 4 mg via INTRAVENOUS
  Filled 2023-09-09: qty 2

## 2023-09-09 MED ORDER — LIDOCAINE-EPINEPHRINE 1 %-1:100000 IJ SOLN
INTRAMUSCULAR | Status: AC
  Filled 2023-09-09: qty 1

## 2023-09-09 MED ORDER — NITROGLYCERIN 1 MG/10 ML FOR IR/CATH LAB
INTRA_ARTERIAL | Status: AC
  Filled 2023-09-09: qty 10

## 2023-09-09 MED ORDER — MIDAZOLAM HCL 2 MG/2ML IJ SOLN
INTRAMUSCULAR | Status: AC | PRN
Start: 2023-09-09 — End: 2023-09-09
  Administered 2023-09-09: 1 mg via INTRAVENOUS

## 2023-09-09 MED ORDER — LACTATED RINGERS IV BOLUS
500.0000 mL | Freq: Once | INTRAVENOUS | Status: AC
  Administered 2023-09-09: 500 mL via INTRAVENOUS

## 2023-09-09 MED ORDER — IOHEXOL 300 MG/ML  SOLN: 100.0000 mL | Freq: Once | INTRAMUSCULAR | Status: DC | PRN

## 2023-09-09 MED ORDER — HEPARIN SODIUM (PORCINE) 1000 UNIT/ML IJ SOLN
INTRAMUSCULAR | Status: AC
  Filled 2023-09-09: qty 1

## 2023-09-09 MED ORDER — LIDOCAINE HCL 1 % IJ SOLN
INTRAMUSCULAR | Status: AC
  Filled 2023-09-09: qty 20

## 2023-09-09 MED ORDER — HEPARIN SODIUM (PORCINE) 1000 UNIT/ML IJ SOLN
INTRAMUSCULAR | Status: AC | PRN
Start: 2023-09-09 — End: 2023-09-09
  Administered 2023-09-09: 1000 [IU] via INTRAVENOUS

## 2023-09-09 MED ORDER — FENTANYL CITRATE (PF) 100 MCG/2ML IJ SOLN
INTRAMUSCULAR | Status: AC
  Filled 2023-09-09: qty 2

## 2023-09-09 MED ORDER — LIDOCAINE HCL 1 % IJ SOLN
20.0000 mL | Freq: Once | INTRAMUSCULAR | Status: AC
  Administered 2023-09-09: 20 mL

## 2023-09-09 MED ORDER — MORPHINE SULFATE (PF) 2 MG/ML IV SOLN
1.0000 mg | INTRAVENOUS | Status: DC | PRN
  Administered 2023-09-09 – 2023-09-20 (×9): 1 mg via INTRAVENOUS
  Filled 2023-09-09 (×10): qty 1

## 2023-09-09 NOTE — H&P (Addendum)
History and Physical    [name] ZOX:[id number] DOB: [birth year] DOA: 09/09/2023  PCP: Marguarite Arbour, MD  Patient coming from: right sided weakness n/v  I have personally briefly reviewed patient's old medical records in [facility]  Chief Complaint:   HPI: [name] is a 77 y.o. female with medical history significant of  HTN, HLD, diverticulosis, DM2, ETOH abuse in remission, right ICA stenosis status post stenting, interim history of admission 8/25/-8/29 for lower gi bleed presumed diverticular with associated hemorrhagic shock which required icu admission while on DAPT. Patient seen by Gi and with stabilization was resumed on DAPT. Patient course further complicated by appendectomy 2 weeks. Patient presents  who further interim history of feeling poorly over the last few days with n/v/d and abdominal  pain. However on waking this am around 2 am with  a bout of n/v she noted weakness/numbness in right leg. Due to this EMS was called. Patient notes her symptoms have resolved and noted no current weakness, paresthesias, vision changes, headache, fever/chills or chest pain .  ED Course:  Vitals:afeb, bp 125/50, hr 65, rr 15, sat 98% In ED vitals: snr LBBB  Cxr: IMPRESSION: Lower lung volumes with basilar atelectasis, especially on the left. CTH IMPRESSION: 1. Stable and negative for age noncontrast CT appearance of the brain. ASPECTS 10. 3. Chronic paranasal sinus disease.  MRI brain  IMPRESSION: 1. No evidence of acute ischemia and essentially stable noncontrast MRI appearance of the brain since last year.  MRA  Head 1. Confirmed Poor flow/Occlusion of the Basilar Artery tip and Right PCA origin, concordant with CTA today. This is new since 2012, and SCA origins also no longer visible by MRA or CTA.   CTA IMPRESSION: 1. 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 Nonvisualization of the distal 3-4 mm of the Basilar, new since 2012.  Involvement of the right PCA origin, but right PCA is reconstituted. SCAs are not identified. Stat Brain MRI being obtained for further evaluation, see that report. This was discussed by telephone with Dr. Tollie Eth on 09/09/2023 at 04:50.   2. Motion degraded MCA and ACA branches with no other strong evidence of ELVO.   3. Substantially improved cervical Left ICA following stenting. High-grade contralateral proximal right ICA stenosis estimated at 69%, not significantly changed from 2021. Calcified ICA siphons with mild to moderate bilateral siphon stenosis.   4. Aortic Atherosclerosis (ICD10-I70.0). Cervical spine degeneration and facet ankylosis.   Labs Na 137, K 4.4, bicarb 17, glu 178, cr 1.17 AG 18 Etoh <`10 CE6  Review of Systems: As per HPI otherwise 10 point review of systems negative.   Past Medical History:  Diagnosis Date   Alcohol abuse    CAP (community acquired pneumonia) 05/05/2018   Carotid artery stenosis    Depression    Diabetes mellitus    Diverticulitis    Essential hypertension 08/20/2007   Qualifier: Diagnosis of   By: Jonny Ruiz MD, Len Blalock        Fibromyalgia    Hepatitis 08/28/2014   Hyperlipidemia 08/20/2007   Qualifier: Diagnosis of   By: Jonny Ruiz MD, Len Blalock        Hypertension    Myalgia and myositis 03/28/2012    Past Surgical History:  Procedure Laterality Date   BUNIONECTOMY     CAROTID PTA/STENT INTERVENTION Left 08/07/2020   Procedure: CAROTID PTA/STENT INTERVENTION;  Surgeon: Annice Needy, MD;  Location: [facility]IVE [facility];  Service: Cardiovascular;  Laterality:  Left;   CESAREAN SECTION     COLONOSCOPY     COLONOSCOPY WITH ESOPHAGOGASTRODUODENOSCOPY (EGD)     COLONOSCOPY WITH PROPOFOL N/A 02/18/2021   Procedure: COLONOSCOPY WITH PROPOFOL;  Surgeon: Toledo, Boykin Nearing, MD;  Location: [facility] ENDOSCOPY;  Service: Gastroenterology;  Laterality: N/A;   ESOPHAGOGASTRODUODENOSCOPY (EGD) WITH PROPOFOL N/A 02/18/2021   Procedure:  ESOPHAGOGASTRODUODENOSCOPY (EGD) WITH PROPOFOL;  Surgeon: Toledo, Boykin Nearing, MD;  Location: [facility] ENDOSCOPY;  Service: Gastroenterology;  Laterality: N/A;   TONSILLECTOMY       reports that she has been smoking cigarettes and e-cigarettes. She has never used smokeless tobacco. She reports that she does not currently use alcohol. She reports that she does not use drugs.  Allergies  Allergen Reactions   Latex Hives   Lipitor [Atorvastatin Calcium] Other (See Comments)    Muscle aches   Codeine Rash    And nausea    Pentazocine Lactate Rash   Sulfonamide Derivatives Rash    Family History  Problem Relation Age of Onset   Cancer Mother    Heart disease Father    Heart disease Sister     Prior to Admission medications   Medication Sig Start Date End Date Taking? Authorizing Provider  amphetamine-dextroamphetamine (ADDERALL XR) 20 MG 24 hr capsule Take 40 mg by mouth every morning. 06/29/23   [provider]  aspirin EC 81 MG EC tablet Take 1 tablet (81 mg total) by mouth daily. Swallow whole. 08/08/20   Stegmayer, Kimberly A, PA-C  BYSTOLIC 5 MG tablet TAKE 1 TABLET DAILY Patient taking differently: Take 5 mg by mouth daily. 09/26/15   Carlus Pavlov, MD  clopidogrel (PLAVIX) 75 MG tablet TAKE 1 TABLET BY MOUTH EVERY DAY Patient taking differently: Take 75 mg by mouth daily. 07/18/23   Georgiana Spinner, NP  diphenoxylate-atropine (LOMOTIL) 2.5-0.025 MG tablet Take 1 tablet by mouth 4 (four) times daily as needed for diarrhea or loose stools.  07/08/10   [provider]  Dulaglutide (TRULICITY) 3 MG/0.5ML SOPN Inject 3 mg into the skin once a week. 06/23/23     empagliflozin (JARDIANCE) 25 MG TABS tablet Take 25 mg by mouth daily.    [provider]  EPINEPHrine 0.3 mg/0.3 mL IJ SOAJ injection Inject 0.3 mg into the muscle as needed for anaphylaxis.    [provider]  glucose blood (BAYER CONTOUR NEXT TEST) test strip Use to test blood sugar 3 times  daily as instructed. Dx code: E11.65 02/10/15   Carlus Pavlov, MD  Lancets (ACCU-CHEK MULTICLIX) lancets Use to test blood sugar 3 times daily as instructed. Dx: E11.65 02/10/15   Carlus Pavlov, MD  lisinopril (PRINIVIL,ZESTRIL) 5 MG tablet TAKE 1 TABLET BY MOUTH EVERY DAY Patient taking differently: Take 5 mg by mouth daily. 07/14/15   Carlus Pavlov, MD  LORazepam (ATIVAN) 0.5 MG tablet Take 1 tablet (0.5 mg total) by mouth daily as needed for anxiety. 09/16/14   Doris Cheadle, MD  metFORMIN (GLUCOPHAGE) 1000 MG tablet Take 1,000 mg by mouth every evening. 10/19/21   [provider]  omeprazole (PRILOSEC) 40 MG capsule Take 40 mg by mouth daily. 08/30/18   [provider]  pregabalin (LYRICA) 300 MG capsule Take 300 mg by mouth 2 (two) times daily. 08/05/20   [provider]    Physical Exam: Vitals:   09/09/23 0600 09/09/23 0615 09/09/23 0715 09/09/23 0815  BP: (!) 144/53 (!) 125/50  128/61  Pulse: 69 65  65  Resp: (!) 8 15  17  Temp:      TempSrc:      SpO2: 100% 98% 99% 98%  Weight:      Height:        Constitutional: NAD, calm, comfortable Vitals:   09/09/23 0600 09/09/23 0615 09/09/23 0715 09/09/23 0815  BP: (!) 144/53 (!) 125/50  128/61  Pulse: 69 65  65  Resp: (!) 8 15  17   Temp:      TempSrc:      SpO2: 100% 98% 99% 98%  Weight:      Height:       Eyes: PERRL, lids and conjunctivae normal ENMT: Mucous membranes are moist. Posterior pharynx clear of any exudate or lesions.Normal dentition.  Neck: normal, supple, no masses, no thyromegaly Respiratory: clear to auscultation bilaterally, no wheezing, no crackles. Normal respiratory effort. No accessory muscle use.  Cardiovascular: Regular rate and rhythm, no murmurs / rubs / gallops. No extremity edema. 2+ pedal pulses.  Abdomen: no tenderness, no masses palpated. No hepatosplenomegaly. Bowel sounds positive.  Musculoskeletal: no clubbing / cyanosis. No joint deformity upper and lower  extremities. Good ROM, no contractures. Normal muscle tone.  Skin: no rashes, lesions, ulcers. No induration Neurologic: CN 2-12 grossly intact. Sensation intact,  Strength 5/5 in all 4.  Psychiatric: Normal judgment and insight. Alert and oriented x 3. Normal mood.    Labs on Admission: I have personally reviewed following labs and imaging studies  CBC: Recent Labs  Lab 09/09/23 0539 09/09/23 0543  WBC 13.9*  --   NEUTROABS 11.4*  --   HGB 9.8* 10.5*  HCT 33.0* 31.0*  MCV 94.6  --   PLT 356  --    Basic Metabolic Panel: Recent Labs  Lab 09/09/23 0539 09/09/23 0543  NA 137 135  K 4.4 4.6  CL 102 106  CO2 17*  --   GLUCOSE 178* 178*  BUN 23 23  CREATININE 1.17* 1.20*  CALCIUM 8.5*  --    GFR: Estimated Creatinine Clearance: 36.1 mL/min (A) (by C-G formula based on SCr of 1.2 mg/dL (H)). Liver Function Tests: Recent Labs  Lab 09/09/23 0539  AST 20  ALT 14  ALKPHOS 63  BILITOT 0.3  PROT 5.4*  ALBUMIN 3.0*   Recent Labs  Lab 09/09/23 0539  LIPASE 42   No results for input(s): "AMMONIA" in the last 168 hours. Coagulation Profile: Recent Labs  Lab 09/09/23 0539  INR 1.0   Cardiac Enzymes: No results for input(s): "CKTOTAL", "CKMB", "CKMBINDEX", "TROPONINI" in the last 168 hours. BNP (last 3 results) No results for input(s): "PROBNP" in the last 8760 hours. HbA1C: No results for input(s): "HGBA1C" in the last 72 hours. CBG: No results for input(s): "GLUCAP" in the last 168 hours. Lipid Profile: No results for input(s): "CHOL", "HDL", "LDLCALC", "TRIG", "CHOLHDL", "LDLDIRECT" in the last 72 hours. Thyroid Function Tests: No results for input(s): "TSH", "T4TOTAL", "FREET4", "T3FREE", "THYROIDAB" in the last 72 hours. Anemia Panel: No results for input(s): "VITAMINB12", "FOLATE", "FERRITIN", "TIBC", "IRON", "RETICCTPCT" in the last 72 hours. Urine analysis:    Component Value Date/Time   COLORURINE YELLOW 08/15/2022 2223   APPEARANCEUR CLEAR  08/15/2022 2223   LABSPEC 1.011 08/15/2022 2223   PHURINE 6.0 08/15/2022 2223   GLUCOSEU NEGATIVE 08/15/2022 2223   GLUCOSEU NEGATIVE 11/17/2006 1240   HGBUR NEGATIVE 08/15/2022 2223   BILIRUBINUR NEGATIVE 08/15/2022 2223   KETONESUR NEGATIVE 08/15/2022 2223   PROTEINUR NEGATIVE 08/15/2022 2223   UROBILINOGEN 0.2 12/30/2014 1927   NITRITE NEGATIVE  08/15/2022 2223   LEUKOCYTESUR NEGATIVE 08/15/2022 2223    Radiological Exams on Admission: CT ABDOMEN PELVIS WO CONTRAST  Result Date: 09/09/2023 CLINICAL DATA:  Bowel obstruction suspected. EXAM: CT ABDOMEN AND PELVIS WITHOUT CONTRAST TECHNIQUE: Multidetector CT imaging of the abdomen and pelvis was performed following the standard protocol without IV contrast. RADIATION DOSE REDUCTION: This exam was performed according to the departmental dose-optimization program which includes automated exposure control, adjustment of the mA and/or kV according to patient size and/or use of iterative reconstruction technique. COMPARISON:  CTA abdomen and pelvis 08/08/2023 FINDINGS: Lower chest: Mild atelectasis in the lung bases. Coronary atherosclerosis. Normal heart size. No pleural effusion. Hepatobiliary: No focal liver abnormality is identified on this unenhanced study. Gallbladder wall thickening is likely secondary to nondistention, without acute pericholecystic inflammation. No biliary dilatation. Pancreas: Unremarkable. Spleen: Unremarkable. Adrenals/Urinary Tract: Unremarkable adrenal glands. Excreted IV contrast material from today's earlier head and neck CTA in the renal collecting systems, ureters, and bladder. No hydronephrosis. 1.5 cm low-density lesion in the right kidney compatible with a cyst with no follow-up imaging recommended. Small amount of nondependent gas in the bladder, query recent catheterization. Stomach/Bowel: The stomach is unremarkable. Mild left-sided colonic diverticulosis is noted. There is no evidence of bowel obstruction or  inflammation. Vascular/Lymphatic: Abdominal aortic atherosclerosis without aneurysm. No enlarged lymph nodes. Reproductive: Uterus and bilateral adnexa are unremarkable. Other: No ascites or pneumoperitoneum. Small fat-containing umbilical hernia. Musculoskeletal: No suspicious osseous lesion. Widespread thoracolumbar facet arthrosis. Moderate disc degeneration about the thoracolumbar junction. IMPRESSION: 1. No acute abnormality identified in the abdomen or pelvis. 2.  Aortic Atherosclerosis (ICD10-I70.0). Electronically Signed   By: Sebastian Ache M.D.   On: 09/09/2023 08:45   MR ANGIO HEAD WO CONTRAST  Result Date: 09/09/2023 CLINICAL DATA:  Code stroke. 77 year old female last known well 2100 hours. Abnormal speech and right side deficits. Distal basilar occlusion or near occlusion by CTA. EXAM: MRA HEAD WITHOUT CONTRAST TECHNIQUE: Angiographic images of the Circle of Willis were acquired using MRA technique without intravenous contrast. COMPARISON:  CTA head and neck, brain MRI this morning. Previous intracranial MRA 12/10/2011. FINDINGS: Intermittently motion degraded exam. Anterior circulation: Antegrade flow maintained in the distal cervical ICAs and both ICA siphons. Patent carotid termini, MCA and ACA origins. Motion artifact again degrades detail of the MCA and ACA branches, and multifocal irregularity of those vessels could be artifact. But no anterior circulation large vessel occlusion is evident. Posterior circulation: Dominant right vertebral artery again noted which supplies the basilar. Distal right vertebral, right PICA and proximal basilar arteries are patent. Mid basilar flow signal maintained although more irregular compared to the 2012 MRA. And there is absent flow signal at the basilar tip and right PCA origin concordant with the CTA peer in [facility] they (series 17, image 96. Flow signal there was maintained in 2012. Likewise, SCA origins are not identified now, were visible in 2012. Right PCA  flow is reconstituted as by CTA. And there is a fetal type left PCA origin again noted. Anatomic variants: Dominant right vertebral artery supplies the basilar. Fetal type left PCA origin. Other: Brain MRI today is reported separately. IMPRESSION: 1. Confirmed Poor flow/Occlusion of the Basilar Artery tip and Right PCA origin, concordant with CTA today. This is new since 2012, and SCA origins also no longer visible by MRA or CTA. See MRI at the same time reported separately. 2. Motion degraded exam. Other evidence of intracranial atherosclerosis, but no other large vessel occlusion identified. Electronically Signed  By: Odessa Fleming M.D.   On: 09/09/2023 05:52   MR BRAIN WO CONTRAST  Result Date: 09/09/2023 CLINICAL DATA:  Code stroke. 77 year old female last known well 2100 hours. Abnormal speech and right side deficits. Distal basilar occlusion or near occlusion by CTA. EXAM: MRI HEAD WITHOUT CONTRAST TECHNIQUE: Multiplanar, multiecho pulse sequences of the brain and surrounding structures were obtained without intravenous contrast. COMPARISON:  CTA head and neck today.  Brain MRI 08/16/2022. FINDINGS: Brain: No restricted diffusion or evidence of acute infarction. Stable cerebral volume. No midline shift, mass effect, evidence of mass lesion, ventriculomegaly, extra-axial collection or acute intracranial hemorrhage. Cervicomedullary junction and pituitary are within normal limits. Patchy bilateral cerebral white matter T2 and FLAIR hyperintensity which is most pronounced in the periatrial regions appears stable from last year. Motion artifact in the posterior fossa on FLAIR imaging today. But T1 and T2 signal in the brainstem and cerebellum appears stable from last year, within normal limits for age. No chronic cerebral blood products on SWI. Vascular: Major intracranial vascular flow voids do appear stable compared to the 2023 MRI, including diminutive and irregular appearance of the distal basilar artery on  series 13, image 10. Skull and upper cervical spine: Stable, negative. Sinuses/Orbits: Chronic paranasal sinus disease. Stable postoperative appearance of the orbits. Other: Mastoids remain well aerated. Visible internal auditory structures appear normal. Negative visible scalp and face. IMPRESSION: 1. No evidence of acute ischemia and essentially stable noncontrast MRI appearance of the brain since last year. This was discussed by telephone with Dr. Erick Blinks on 09/09/2023 at 0515 hours. 2. MRA is reported separately. Electronically Signed   By: Odessa Fleming M.D.   On: 09/09/2023 05:42   CT ANGIO HEAD NECK W WO CM (CODE STROKE)  Result Date: 09/09/2023 CLINICAL DATA:  Code stroke. 77 year old female last known well 2100 hours. Abnormal speech and right side deficits. EXAM: CT ANGIOGRAPHY HEAD AND NECK WITH AND WITHOUT CONTRAST TECHNIQUE: Multidetector CT imaging of the head and neck was performed using the standard protocol during bolus administration of intravenous contrast. Multiplanar CT image reconstructions and MIPs were obtained to evaluate the vascular anatomy. Carotid stenosis measurements (when applicable) are obtained utilizing NASCET criteria, using the distal internal carotid diameter as the denominator. RADIATION DOSE REDUCTION: This exam was performed according to the departmental dose-optimization program which includes automated exposure control, adjustment of the mA and/or kV according to patient size and/or use of iterative reconstruction technique. CONTRAST:  75mL OMNIPAQUE IOHEXOL 350 MG/ML SOLN COMPARISON:  Plain head CT 0436 hours today. Prior CTA neck 07/01/2020. Previous brain MRA 12/10/2011. And brain MRI last year 08/16/2022. FINDINGS: CTA NECK Skeleton: Chronic paranasal sinus disease. Chronic cervical spine degeneration. Facet arthropathy and C4-C5 facet ankylosis on the right side. Bilateral cervicothoracic facet ankylosis. No acute osseous abnormality identified. Upper chest:  Negative. Other neck: No acute finding. Aortic arch: Calcified aortic atherosclerosis.  3 vessel arch. Right carotid system: No significant brachiocephalic artery or right CCA origin plaque or stenosis. Tortuous proximal right CCA. Bulky soft and calcified plaque along the medial vessel proximal to the bifurcation. Up to 50 % stenosis with respect to the distal vessel (series 7, image 113). Bulky calcified plaque at the right ICA origin and bulb with high-grade stenosis numerically estimated at 69 % with respect to the distal vessel (series 5, image 201). The vessel remains patent to the skull base. This does not appear significantly changed since 2021. Left carotid system: Left CCA origin plaque without stenosis. Patent stent  beginning in the distal left CCA and continuing through the ICA, with substantially improved flow there compared to the 2021 CTA. No significant stenosis to the skull base. Vertebral arteries: Proximal right subclavian artery calcified plaque without stenosis. Normal right vertebral artery origin. Dominant appearing right vertebral artery is patent and mildly tortuous to the skull base without stenosis. Proximal left subclavian artery plaque and also motion artifact. No high-grade proximal subclavian artery stenosis. Left vertebral artery is non dominant and highly diminutive. There is chronic calcified plaque at its origin. Diminutive left vertebral artery appears stable since 2021, remains patent although diminutive to the skull base. CTA HEAD Posterior circulation: Dominant right vertebral V4 segment with normal PICA origins supplies the proximal basilar. Diminutive left vertebral artery functionally terminates in PICA as before. Proximal basilar artery is patent, but there is distal basilar stenosis or thrombosis along a segment of 3-4 mm as seen on series 10, image 26. This level was not included on the 2021 CTA, but was patent on the 2012 MR a. there is reconstitution of the right PCA  origin, with diminutive or absent right posterior communicating artery on that side. There is a contralateral fetal type left PCA origin. Bilateral PCA branches remain patent with mild irregularity. SCA origins are not identified. Anterior circulation: Both ICA siphons are patent. Both ICA siphons are calcified. On the left there is mild to moderate siphon stenosis maximal in the supraclinoid segment. On the right there is also mild to moderate supraclinoid stenosis. Normal left posterior communicating artery origin. Patent carotid termini. Dominant appearing right ACA A1, similar to 2012 MRA. Normal anterior communicating artery. Patent MCA origins. Bilateral MCA and ACA branch detail is motion degraded. Both MCA bifurcations appear to remain patent. But MCA and ACA branch detail is otherwise limited. Delayed phase images were obtained (series 14), and seem to demonstrate fairly symmetric A2 and M2 enhancement. Venous sinuses: Patent on the delayed images. Anatomic variants: Chronically dominant right and diminutive left vertebral arteries. Left vertebral functionally terminates in PICA. Fetal type left PCA origin. Dominant right ACA A1. Review of the MIP images confirms the above findings Preliminary report of this exam was discussed by telephone with Dr. Tollie Eth on 09/09/2023 at 04:50 . IMPRESSION: 1. 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 Nonvisualization of the distal 3-4 mm of the Basilar, new since 2012. Involvement of the right PCA origin, but right PCA is reconstituted. SCAs are not identified. Stat Brain MRI being obtained for further evaluation, see that report. This was discussed by telephone with Dr. Tollie Eth on 09/09/2023 at 04:50. 2. Motion degraded MCA and ACA branches with no other strong evidence of ELVO. 3. Substantially improved cervical Left ICA following stenting. High-grade contralateral proximal right ICA stenosis estimated at 69%, not significantly changed  from 2021. Calcified ICA siphons with mild to moderate bilateral siphon stenosis. 4. Aortic Atherosclerosis (ICD10-I70.0). Cervical spine degeneration and facet ankylosis. Electronically Signed   By: Odessa Fleming M.D.   On: 09/09/2023 05:26   DG Chest Port 1 View  Result Date: 09/09/2023 CLINICAL DATA:  Code stroke. 77 year old female last known well 2100 hours. Abnormal speech and right side deficits. EXAM: PORTABLE CHEST 1 VIEW COMPARISON:  CTA neck today.  Portable chest 08/15/2022. FINDINGS: Portable AP semi upright view at 0418 hours. Lower lung volumes compared to last year. Platelike left lung base opacity. But elsewhere Allowing for portable technique the lungs are clear. Upper lungs appeared clear on CTA today. Normal cardiac size and  mediastinal contours. Visualized tracheal air column is within normal limits. No acute osseous abnormality identified. Paucity of bowel gas. IMPRESSION: Lower lung volumes with basilar atelectasis, especially on the left. Electronically Signed   By: Odessa Fleming M.D.   On: 09/09/2023 04:52   CT Head Code Stroke WO Contrast  Result Date: 09/09/2023 CLINICAL DATA:  Code stroke. 77 year old female last known well 2100 hours. Abnormal speech and right side deficits. EXAM: CT HEAD WITHOUT CONTRAST TECHNIQUE: Contiguous axial images were obtained from the base of the skull through the vertex without intravenous contrast. RADIATION DOSE REDUCTION: This exam was performed according to the departmental dose-optimization program which includes automated exposure control, adjustment of the mA and/or kV according to patient size and/or use of iterative reconstruction technique. COMPARISON:  Brain MRI 08/16/2022.  Head CT 08/15/2022. FINDINGS: Brain: Stable cerebral volume, normal for age. Stable gray-white matter differentiation throughout the brain. No midline shift, ventriculomegaly, mass effect, evidence of mass lesion, intracranial hemorrhage or evidence of cortically based acute  infarction. Vascular: Calcified atherosclerosis at the skull base. No suspicious intracranial vascular hyperdensity. Skull: Stable.  No acute osseous abnormality identified. 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 Chronic right ethmoidectomy. Tympanic cavities and mastoids remain clear. Other: No gaze deviation. Visualized scalp soft tissues are within normal limits. ASPECTS [facility] Stroke Program Early CT Score) Total score (0-10 with 10 being normal): 10 IMPRESSION: 1. Stable and negative for age noncontrast CT appearance of the brain. ASPECTS 10. 2. The above communicated to Dr. Derry Lory at 4:44 am on 09/09/2023 by text page via the [facility] messaging system. 3. Chronic paranasal sinus disease. Electronically Signed   By: Odessa Fleming M.D.   On: 09/09/2023 04:45    EKG: Independently reviewed.   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 Held home meds.  - Recommend Telemetry monitoring for arrythmia  Leukocytosis  Hx of appendectomy  2 weeks go  - f/u with CT abd and pelvis  -check lactic,esr/procal  - monitor fever curve - no abx currently    Recent Hx of LGI bleed - h/h currently stable continue to monitor   HTN -hold oral medication for permissive hypertension    HLD -continue home statin    DM2 -iss/fs    ETOH abuse -in remision DVT prophylaxis: heparin Code Status: full/ as discussed per patient wishes in event of cardiac arrest  Family Communication: none at bedside Disposition Plan: patient  expected to be admitted less than 2 midnights  Consults called: Neurology  Dr Derry Lory  Admission status: admit progressive   Lurline Del  MD [facility]   If 7PM-7AM, please contact night-coverage [email or web address] Password [facility]  09/09/2023, 10:42 AM

## 2023-09-09 NOTE — ED Notes (Signed)
Pt back to ED, husband at bedside.

## 2023-09-09 NOTE — ED Notes (Signed)
Pt remains drowsy, wakes when spoken to, VSS at present. NAD noted. Unable to perform swallow screen at present due to pt drowsiness. Pt does however get dizzy with movement.

## 2023-09-09 NOTE — ED Provider Notes (Signed)
[place] EMERGENCY DEPARTMENT AT [facility] Provider Note   CSN: [id number] Arrival date & time: 09/09/23  0350     History  Chief Complaint  Patient presents with   Emesis    [name] is a 77 y.o. female.  The history is provided by the patient, the EMS personnel and medical records.  Emesis [name] is a 77 y.o. female who presents to the Emergency Department complaining of vomiting.  She presents to the emergency department by EMS for vomiting that started at 2 AM when she woke up to use the bathroom.  She also noticed that her right leg was somewhat numb and she had trouble walking, so her husband had to assist her to the bathroom.  She states that she had an appendectomy 2 weeks ago when she was in [place] and has not been able to eat since then.  She did try some soup for dinner around 6 PM and that went okay.  She went to bed at 9 PM and was at her baseline.  No associated chest pain, abdominal pain, diarrhea, hematochezia, melena, hematemesis.  She is having normal bowel movements since her appendectomy.  EMS reports that she had some agitation and diaphoresis and did require 2.5 mg of midazolam prior to ED arrival for her agitation.     Home Medications Prior to Admission medications   Medication Sig Start Date End Date Taking? Authorizing Provider  amphetamine-dextroamphetamine (ADDERALL XR) 20 MG 24 hr capsule Take 40 mg by mouth every morning. 06/29/23   [provider]  aspirin EC 81 MG EC tablet Take 1 tablet (81 mg total) by mouth daily. Swallow whole. 08/08/20   Stegmayer, Kimberly A, PA-C  BYSTOLIC 5 MG tablet TAKE 1 TABLET DAILY Patient taking differently: Take 5 mg by mouth daily. 09/26/15   Carlus Pavlov, MD  clopidogrel (PLAVIX) 75 MG tablet TAKE 1 TABLET BY MOUTH EVERY DAY Patient taking differently: Take 75 mg by mouth daily. 07/18/23   Georgiana Spinner, NP  diphenoxylate-atropine (LOMOTIL) 2.5-0.025 MG tablet Take 1 tablet by  mouth 4 (four) times daily as needed for diarrhea or loose stools.  07/08/10   [provider]  Dulaglutide (TRULICITY) 3 MG/0.5ML SOPN Inject 3 mg into the skin once a week. 06/23/23     empagliflozin (JARDIANCE) 25 MG TABS tablet Take 25 mg by mouth daily.    [provider]  EPINEPHrine 0.3 mg/0.3 mL IJ SOAJ injection Inject 0.3 mg into the muscle as needed for anaphylaxis.    [provider]  glucose blood (BAYER CONTOUR NEXT TEST) test strip Use to test blood sugar 3 times daily as instructed. Dx code: E11.65 02/10/15   Carlus Pavlov, MD  Lancets (ACCU-CHEK MULTICLIX) lancets Use to test blood sugar 3 times daily as instructed. Dx: E11.65 02/10/15   Carlus Pavlov, MD  lisinopril (PRINIVIL,ZESTRIL) 5 MG tablet TAKE 1 TABLET BY MOUTH EVERY DAY Patient taking differently: Take 5 mg by mouth daily. 07/14/15   Carlus Pavlov, MD  LORazepam (ATIVAN) 0.5 MG tablet Take 1 tablet (0.5 mg total) by mouth daily as needed for anxiety. 09/16/14   Doris Cheadle, MD  metFORMIN (GLUCOPHAGE) 1000 MG tablet Take 1,000 mg by mouth every evening. 10/19/21   [provider]  omeprazole (PRILOSEC) 40 MG capsule Take 40 mg by mouth daily. 08/30/18   [provider]  pregabalin (LYRICA) 300 MG capsule Take 300 mg by mouth 2 (two) times daily. 08/05/20  [provider]      Allergies    Latex, Lipitor [atorvastatin calcium], Codeine, Pentazocine lactate, and Sulfonamide derivatives    Review of Systems   Review of Systems  Gastrointestinal:  Positive for vomiting.  All other systems reviewed and are negative.   Physical Exam Updated Vital Signs BP (!) 125/50   Pulse 65   Temp 97.8 F (36.6 C) (Oral)   Resp 15   Ht 5\' 2"  (1.575 m)   Wt 65.8 kg   SpO2 98%   BMI 26.52 kg/m  Physical Exam Vitals and nursing note reviewed.  Constitutional:      General: She is in acute distress.     Appearance: She is well-developed. She is ill-appearing and  diaphoretic.  HENT:     Head: Normocephalic and atraumatic.  Cardiovascular:     Rate and Rhythm: Normal rate and regular rhythm.     Heart sounds: No murmur heard. Pulmonary:     Effort: Pulmonary effort is normal. No respiratory distress.     Breath sounds: Normal breath sounds.  Abdominal:     Palpations: Abdomen is soft.     Tenderness: There is no abdominal tenderness. There is no guarding or rebound.  Musculoskeletal:        General: No tenderness.  Skin:    General: Skin is warm.     Coloration: Skin is not pale.  Neurological:     Mental Status: She is alert.     Comments: Dysarthric speech.  No asymmetry of facial movements.  5 out of 5 strength in bilateral upper extremities.  There is mild weakness in the right lower extremity with right lower extremity drift.  There are diminished but symmetric DP pulses bilaterally.  Psychiatric:        Behavior: Behavior normal.     ED Results / Procedures / Treatments   Labs (all labs ordered are listed, but only abnormal results are displayed) Labs Reviewed  COMPREHENSIVE METABOLIC PANEL - Abnormal; Notable for the following components:      Result Value   CO2 17 (*)    Glucose, Bld 178 (*)    Creatinine, Ser 1.17 (*)    Calcium 8.5 (*)    Total Protein 5.4 (*)    Albumin 3.0 (*)    GFR, Estimated 49 (*)    Anion gap 18 (*)    All other components within normal limits  CBC WITH DIFFERENTIAL/PLATELET - Abnormal; Notable for the following components:   WBC 13.9 (*)    RBC 3.49 (*)    Hemoglobin 9.8 (*)    HCT 33.0 (*)    MCHC 29.7 (*)    RDW 19.8 (*)    Neutro Abs 11.4 (*)    Abs Immature Granulocytes 0.17 (*)    All other components within normal limits  APTT - Abnormal; Notable for the following components:   aPTT 22 (*)    All other components within normal limits  I-STAT CHEM 8, ED - Abnormal; Notable for the following components:   Creatinine, Ser 1.20 (*)    Glucose, Bld 178 (*)    Calcium, Ion 0.98 (*)     TCO2 18 (*)    Hemoglobin 10.5 (*)    HCT 31.0 (*)    All other components within normal limits  LIPASE, BLOOD  ETHANOL  PROTIME-INR  URINALYSIS, W/ REFLEX TO CULTURE (INFECTION SUSPECTED)  RAPID URINE DRUG SCREEN, HOSP PERFORMED  CBG MONITORING, ED  CBG MONITORING, ED  TROPONIN  I (HIGH SENSITIVITY)  TROPONIN I (HIGH SENSITIVITY)    EKG EKG Interpretation Date/Time:  Friday September 09 2023 04:01:51 EDT Ventricular Rate:  71 PR Interval:  182 QRS Duration:  147 QT Interval:  458 QTC Calculation: 498 R Axis:   6  Text Interpretation: Sinus rhythm Left bundle branch block Confirmed by Tilden Fossa [phone]) on 09/09/2023 4:10:59 AM  Radiology DG Chest Port 1 View  Result Date: 09/09/2023 CLINICAL DATA:  Code stroke. 77 year old female last known well 2100 hours. Abnormal speech and right side deficits. EXAM: PORTABLE CHEST 1 VIEW COMPARISON:  CTA neck today.  Portable chest 08/15/2022. FINDINGS: Portable AP semi upright view at 0418 hours. Lower lung volumes compared to last year. Platelike left lung base opacity. But elsewhere Allowing for portable technique the lungs are clear. Upper lungs appeared clear on CTA today. Normal cardiac size and mediastinal contours. Visualized tracheal air column is within normal limits. No acute osseous abnormality identified. Paucity of bowel gas. IMPRESSION: Lower lung volumes with basilar atelectasis, especially on the left. Electronically Signed   By: Odessa Fleming M.D.   On: 09/09/2023 04:52   CT Head Code Stroke WO Contrast  Result Date: 09/09/2023 CLINICAL DATA:  Code stroke. 77 year old female last known well 2100 hours. Abnormal speech and right side deficits. EXAM: CT HEAD WITHOUT CONTRAST TECHNIQUE: Contiguous axial images were obtained from the base of the skull through the vertex without intravenous contrast. RADIATION DOSE REDUCTION: This exam was performed according to the departmental dose-optimization program which includes automated  exposure control, adjustment of the mA and/or kV according to patient size and/or use of iterative reconstruction technique. COMPARISON:  Brain MRI 08/16/2022.  Head CT 08/15/2022. FINDINGS: Brain: Stable cerebral volume, normal for age. Stable gray-white matter differentiation throughout the brain. No midline shift, ventriculomegaly, mass effect, evidence of mass lesion, intracranial hemorrhage or evidence of cortically based acute infarction. Vascular: Calcified atherosclerosis at the skull base. No suspicious intracranial vascular hyperdensity. Skull: Stable.  No acute osseous abnormality identified. 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 Chronic right ethmoidectomy. Tympanic cavities and mastoids remain clear. Other: No gaze deviation. Visualized scalp soft tissues are within normal limits. ASPECTS [facility] Stroke Program Early CT Score) Total score (0-10 with 10 being normal): 10 IMPRESSION: 1. Stable and negative for age noncontrast CT appearance of the brain. ASPECTS 10. 2. The above communicated to Dr. Derry Lory at 4:44 am on 09/09/2023 by text page via the [facility] messaging system. 3. Chronic paranasal sinus disease. Electronically Signed   By: Odessa Fleming M.D.   On: 09/09/2023 04:45    Procedures Procedures    Medications Ordered in ED Medications  sodium chloride 0.9 % bolus 500 mL (500 mLs Intravenous New Bag/Given 09/09/23 0627)  iohexol (OMNIPAQUE) 350 MG/ML injection 75 mL (75 mLs Intravenous Contrast Given 09/09/23 0454)  ondansetron (ZOFRAN) injection 4 mg (4 mg Intravenous Given 09/09/23 6045)    ED Course/ Medical Decision Making/ A&P                                 Medical Decision Making Amount and/or Complexity of Data Reviewed Labs: ordered. Radiology: ordered.  Risk Prescription drug management.   Patient with history of recent GI bleed as well as appendectomy here for evaluation of vomiting.  She did not  reveal to EMS but at time of ED arrival reports new right lower extremity  weakness, last known well when she went to bed at 9 PM.  She is dysarthric, ill-appearing and mildly confused on examination and does have some right lower extremity drift.  Code stroke was activated due to her symptoms.  She was evaluated by neurology and had imaging that is not consistent with acute CVA.  Given her emesis, plan to obtain CT abdomen pelvis to evaluate for bowel obstruction.  She was treated with IV fluids.  Patient care transferred pending CT abdomen pelvis, urinalysis and repeat evaluation.        Final Clinical Impression(s) / ED Diagnoses Final diagnoses:  None    Rx / DC Orders ED Discharge Orders     None         Tilden Fossa, MD 09/09/23 [phone]

## 2023-09-09 NOTE — Consult Note (Signed)
NEUROLOGY CONSULTATION NOTE   Date of service: September 09, 2023 Patient Name: [name] MRN:  [id number] DOB:  08/07/1946 Reason for consult: "?rigith sided weakness, nausea, vomiting" Requesting Provider: Tilden Fossa, MD _ _ _   _ __   _ __ _ _  __ __   _ __   __ _  History of Present Illness  [name] is a 77 y.o. female with PMH significant for HTN, HLD, diverticulosis, DM2, who presents to the ED with a few days of not feeling well with nausea, vomiting, diarrhea and complaining of abdominal pain.  Last night, went to bed at 2100 and then woke up at 0200 with nausea and vomiting. She also noticed trouble with walking and R leg felt subjectiuvely numb. Per husband, has not been able to eat much over the last few days and mentions extensive hx of diverticulosos. EMS gave her Versed 5mg  for comabtiveness.  A code stroke was activated with LKW of 2100.  LKW: 2100 mRS: 0 tNKASE:  not offered, MRI brain negative for stroke. Thrombectomy: not offered, low suspiion for stroke. NIHSS components Score: Comment  1a Level of Conscious 0[x]  1[]  2[]  3[]      1b LOC Questions 0[x]  1[]  2[]       1c LOC Commands 0[x]  1[]  2[]       2 Best Gaze 0[x]  1[]  2[]       3 Visual 0[x]  1[]  2[]  3[]      4 Facial Palsy 0[x]  1[]  2[]  3[]      5a Motor Arm - left 0[x]  1[]  2[]  3[]  4[]  UN[]    5b Motor Arm - Right 0[x]  1[]  2[]  3[]  4[]  UN[]    6a Motor Leg - Left 0[x]  1[]  2[]  3[]  4[]  UN[]    6b Motor Leg - Right 0[x]  1[]  2[]  3[]  4[]  UN[]    7 Limb Ataxia 0[x]  1[]  2[]  3[]  UN[]     8 Sensory 0[x]  1[]  2[]  UN[]      9 Best Language 0[x]  1[]  2[]  3[]      10 Dysarthria 0[x]  1[]  2[]  UN[]      11 Extinct. and Inattention 0[x]  1[]  2[]       TOTAL: 0         ROS   Constitutional Denies weight loss, fever and chills.   HEENT Denies changes in vision and hearing.   Respiratory Denies SOB and cough.   CV Denies palpitations and CP   GI + abdominal pain, nausea, vomiting and diarrhea.   GU Denies dysuria and  urinary frequency.   MSK Denies myalgia and joint pain.   Skin Denies rash and pruritus.   Neurological Denies headache and syncope.   Psychiatric Denies recent changes in mood. Denies anxiety and depression.    Past History   Past Medical History:  Diagnosis Date   Alcohol abuse    CAP (community acquired pneumonia) 05/05/2018   Carotid artery stenosis    Depression    Diabetes mellitus    Diverticulitis    Essential hypertension 08/20/2007   Qualifier: Diagnosis of   By: Jonny Ruiz MD, Len Blalock        Fibromyalgia    Hepatitis 08/28/2014   Hyperlipidemia 08/20/2007   Qualifier: Diagnosis of   By: Jonny Ruiz MD, Len Blalock        Hypertension    Myalgia and myositis 03/28/2012   Past Surgical History:  Procedure Laterality Date   BUNIONECTOMY     CAROTID PTA/STENT INTERVENTION Left 08/07/2020   Procedure: CAROTID PTA/STENT INTERVENTION;  Surgeon: Festus Barren  S, MD;  Location: [facility] INVASIVE CV LAB;  Service: Cardiovascular;  Laterality: Left;   CESAREAN SECTION     COLONOSCOPY     COLONOSCOPY WITH ESOPHAGOGASTRODUODENOSCOPY (EGD)     COLONOSCOPY WITH PROPOFOL N/A 02/18/2021   Procedure: COLONOSCOPY WITH PROPOFOL;  Surgeon: Toledo, Boykin Nearing, MD;  Location: [facility] ENDOSCOPY;  Service: Gastroenterology;  Laterality: N/A;   ESOPHAGOGASTRODUODENOSCOPY (EGD) WITH PROPOFOL N/A 02/18/2021   Procedure: ESOPHAGOGASTRODUODENOSCOPY (EGD) WITH PROPOFOL;  Surgeon: Toledo, Boykin Nearing, MD;  Location: [facility] ENDOSCOPY;  Service: Gastroenterology;  Laterality: N/A;   TONSILLECTOMY     Family History  Problem Relation Age of Onset   Cancer Mother    Heart disease Father    Heart disease Sister    Social History   Socioeconomic History   Marital status: Married    Spouse name: Not on file   Number of children: Not on file   Years of education: Not on file   Highest education level: Not on file  Occupational History   Not on file  Tobacco Use   Smoking status: Every Day    Current packs/day: 0.00     Types: Cigarettes, E-cigarettes    Last attempt to quit: 03/11/2010    Years since quitting: 13.5   Smokeless tobacco: Never  Vaping Use   Vaping status: Former  Substance and Sexual Activity   Alcohol use: Not Currently   Drug use: No    Comment: Pt denies    Sexual activity: Never  Other Topics Concern   Not on file  Social History Narrative   Not on file   Social Determinants of Health   Financial Resource Strain: Not on file  Food Insecurity: Not on file  Transportation Needs: Not on file  Physical Activity: Not on file  Stress: Not on file  Social Connections: Not on file   Allergies  Allergen Reactions   Latex Hives   Lipitor [Atorvastatin Calcium] Other (See Comments)    Muscle aches   Codeine Rash    And nausea    Pentazocine Lactate Rash   Sulfonamide Derivatives Rash    Medications  (Not in a hospital admission)    Vitals   Vitals:   09/09/23 0415 09/09/23 0545 09/09/23 0600 09/09/23 0615  BP:  (!) 146/48 (!) 144/53 (!) 125/50  Pulse: 68 66 69 65  Resp: (!) 31 15 (!) 8 15  Temp:      TempSrc:      SpO2: 94% 96% 100% 98%  Weight:      Height:         Body mass index is 26.52 kg/m.  Physical Exam   General: Laying comfortably in bed; in no acute distress.  HENT: Normal oropharynx and mucosa. Normal external appearance of ears and nose.  Neck: Supple, no pain or tenderness  CV: No JVD. No peripheral edema.  Pulmonary: Symmetric Chest rise. Normal respiratory effort.  Abdomen: Soft to touch, non-tender.  Ext: No cyanosis, edema, or deformity  Skin: No rash. Normal palpation of skin.   Musculoskeletal: Normal digits and nails by inspection. No clubbing.   Neurologic Examination  Mental status/Cognition: Alert, oriented to self, place, month and year, good attention.  Speech/language: Fluent, comprehension intact, object naming intact, repetition intact.  Cranial nerves:   CN II Pupils equal and reactive to light, no VF deficits    CN  III,IV,VI EOM intact, no gaze preference or deviation, no nystagmus    CN V normal sensation in V1, V2, and  V3 segments bilaterally    CN VII no asymmetry, no nasolabial fold flattening    CN VIII normal hearing to speech    CN IX & X normal palatal elevation, no uvular deviation    CN XI 5/5 head turn and 5/5 shoulder shrug bilaterally    CN XII midline tongue protrusion    Motor:  Muscle bulk: normal, tone normal, pronator drift none tremor none Mvmt Root Nerve  Muscle Right Left Comments  SA C5/6 Ax Deltoid 5 5   EF C5/6 Mc Biceps 5 5   EE C6/7/8 Rad Triceps 5 5   WF C6/7 Med FCR     WE C7/8 PIN ECU     F Ab C8/T1 U ADM/FDI 4+ 5   HF L1/2/3 Fem Illopsoas 5 5   KE L2/3/4 Fem Quad 5    DF L4/5 D Peron Tib Ant 5 5   PF S1/2 Tibial Grc/Sol 5 5    Sensation:  Light touch Intact throughout   Pin prick    Temperature    Vibration   Proprioception    Coordination/Complex Motor:  - Finger to Nose intact BL - Heel to shin intact BL - Rapid alternating movement are slightly slowed on the right compared to left. - Gait: deferred.  Labs   CBC:  Recent Labs  Lab 09/09/23 0539 09/09/23 0543  WBC 13.9*  --   NEUTROABS 11.4*  --   HGB 9.8* 10.5*  HCT 33.0* 31.0*  MCV 94.6  --   PLT 356  --     Basic Metabolic Panel:  Lab Results  Component Value Date   NA 135 09/09/2023   K 4.6 09/09/2023   CO2 17 (L) 09/09/2023   GLUCOSE 178 (H) 09/09/2023   BUN 23 09/09/2023   CREATININE 1.20 (H) 09/09/2023   CALCIUM 8.5 (L) 09/09/2023   GFRNONAA 49 (L) 09/09/2023   GFRAA >60 08/08/2020   Lipid Panel:  Lab Results  Component Value Date   LDLCALC 135 (H) 04/18/2014   HgbA1c:  Lab Results  Component Value Date   HGBA1C 5.8 (H) 08/09/2023   Urine Drug Screen:     Component Value Date/Time   LABOPIA NONE DETECTED 08/15/2022 2223   COCAINSCRNUR NONE DETECTED 08/15/2022 2223   COCAINSCRNUR NEGATIVE 12/25/2010 1430   LABBENZ NONE DETECTED 08/15/2022 2223   LABBENZ (A)  12/25/2010 1430    POSITIVE (NOTE) Result repeated and verified. Sent for confirmatory testing   AMPHETMU POSITIVE (A) 08/15/2022 2223   THCU POSITIVE (A) 08/15/2022 2223   LABBARB NONE DETECTED 08/15/2022 2223    Alcohol Level     Component Value Date/Time   ETH <10 09/09/2023 0539   INR  Lab Results  Component Value Date   INR 1.0 09/09/2023   APTT  Lab Results  Component Value Date   APTT 22 (L) 09/09/2023     CT Head without contrast(Personally reviewed): CTH was negative for a large hypodensity concerning for a large territory infarct or hyperdensity concerning for an ICH  CT angio Head and Neck with contrast(Personally reviewed): 1. 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 Nonvisualization of the distal 3-4 mm of the Basilar, new since 2012. Involvement of the right PCA origin, but right PCA is reconstituted. SCAs are not identified. Stat Brain MRI being obtained for further evaluation, see that report. This was discussed by telephone with Dr. Tollie Eth on 09/09/2023 at 04:50.  2. Motion degraded MCA and ACA branches with no other strong  evidence of ELVO.  3. Substantially improved cervical Left ICA following stenting. High-grade contralateral proximal right ICA stenosis estimated at 69%, not significantly changed from 2021. Calcified ICA siphons with mild to moderate bilateral siphon stenosis.  4. Aortic Atherosclerosis (ICD10-I70.0). Cervical spine degeneration and facet ankylosis.  MRI Brain(Personally reviewed): 1. No evidence of acute ischemia and essentially stable noncontrast MRI appearance of the brain since last year.   Impression   [name] is a 77 y.o. female with PMH significant for with PMH significant for HTN, HLD, diverticulosis, DM2, who presents to the ED with a few days of not feeling well with nausea, vomiting, diarrhea and complaining of abdominal pain. She developed mild R sided weakness when she woke up  in addition to these symptoms. Her neurologic examination is notable for mild R sided weakness with no other focal neurologic deficit.  She was outside tnkase and too mild to consider treatment. She was not a candidate for thrombectomy 2/2 too mild to treat.  I suspect that despite negative MRI, she probably does have a small posterior circulation stroke.  Recommendations  - Frequent Neuro checks per stroke unit protocol - Recommend obtaining TTE - Recommend obtaining Lipid panel with LDL - Please start statin if LDL > 70 - Recommend HbA1c to evaluate for diabetes and how well it is controlled. - Antithrombotic - Aspirin 81mg  dialy along with plavix 75mg  daily x 21 days, folllowed by Aspirin 81mg  daily. - Recommend DVT ppx - SBP goal - permissive hypertension first 24 h < 220/110. Held home meds.  - Recommend Telemetry monitoring for arrythmia - Recommend bedside swallow screen prior to PO intake. - Stroke education booklet - Recommend PT/OT/SLP consult   ______________________________________________________________________   Thank you for the opportunity to take part in the care of this patient. If you have any further questions, please contact the neurology consultation attending.  Signed,  Erick Blinks [facility] _ _ _   _ __   _ __ _ _  __ __   _ __   __ _

## 2023-09-09 NOTE — ED Notes (Signed)
ED TO INPATIENT HANDOFF REPORT  ED Nurse Name and Phone #: Morrie Sheldon [phone]  S Name/Age/Gender [name] 77 y.o. female Room/Bed: 021C/021C  Code Status   Code Status: Prior  Home/SNF/Other Home Patient oriented to: self, place, time, and situation Is this baseline? Yes   Triage Complete: Triage complete  Chief Complaint TIA (transient ischemic attack) [G45.9]  Triage Note Pt BIB EMS, reports that they were called by husband for nausea and vomiting, upon arrival on scene, pt was "gagging", nausea, and "panicky", pt was given zofran for the nausea and vomiting, pt was combative with EMS and was given Versed. Pt arrived with NRB mask and EKG unremarkable   Allergies Allergies  Allergen Reactions   Latex Hives   Lipitor [Atorvastatin Calcium] Other (See Comments)    Muscle aches   Codeine Rash    And nausea    Pentazocine Lactate Rash   Sulfonamide Derivatives Rash    Level of Care/Admitting Diagnosis ED Disposition     ED Disposition  Admit   Condition  --   Comment  Hospital Area: [facility] [[id number]]  Level of Care: Progressive [102]  Admit to Progressive based on following criteria: NEUROLOGICAL AND NEUROSURGICAL complex patients with significant risk of instability, who do not meet ICU criteria, yet require close observation or frequent assessment (< / = every 2 - 4 hours) with medical / nursing intervention.  May admit patient to [facility] or [facility] if equivalent level of care is available:: No  Covid Evaluation: Asymptomatic - no recent exposure (last 10 days) testing not required  Diagnosis: TIA (transient ischemic attack) [562130]  Admitting Physician: Lurline Del [[id number]]  Attending Physician: Lurline Del [[id number]]  Certification:: I certify this patient will need inpatient services for at least 2 midnights  Expected Medical Readiness: 09/12/2023          B Medical/Surgery History Past Medical History:   Diagnosis Date   Alcohol abuse    CAP (community acquired pneumonia) 05/05/2018   Carotid artery stenosis    Depression    Diabetes mellitus    Diverticulitis    Essential hypertension 08/20/2007   Qualifier: Diagnosis of   By: Jonny Ruiz MD, Len Blalock        Fibromyalgia    Hepatitis 08/28/2014   Hyperlipidemia 08/20/2007   Qualifier: Diagnosis of   By: Jonny Ruiz MD, Len Blalock        Hypertension    Myalgia and myositis 03/28/2012   Past Surgical History:  Procedure Laterality Date   BUNIONECTOMY     CAROTID PTA/STENT INTERVENTION Left 08/07/2020   Procedure: CAROTID PTA/STENT INTERVENTION;  Surgeon: Annice Needy, MD;  Location: [facility] INVASIVE CV LAB;  Service: Cardiovascular;  Laterality: Left;   CESAREAN SECTION     COLONOSCOPY     COLONOSCOPY WITH ESOPHAGOGASTRODUODENOSCOPY (EGD)     COLONOSCOPY WITH PROPOFOL N/A 02/18/2021   Procedure: COLONOSCOPY WITH PROPOFOL;  Surgeon: Toledo, Boykin Nearing, MD;  Location: [facility] ENDOSCOPY;  Service: Gastroenterology;  Laterality: N/A;   ESOPHAGOGASTRODUODENOSCOPY (EGD) WITH PROPOFOL N/A 02/18/2021   Procedure: ESOPHAGOGASTRODUODENOSCOPY (EGD) WITH PROPOFOL;  Surgeon: Toledo, Boykin Nearing, MD;  Location: [facility] ENDOSCOPY;  Service: Gastroenterology;  Laterality: N/A;   TONSILLECTOMY       A IV Location/Drains/Wounds Patient Lines/Drains/Airways Status     Active Line/Drains/Airways     Name Placement date Placement time Site Days   Peripheral IV 09/09/23 18 G Left;Posterior Hand 09/09/23  0402  Hand  less than 1  Intake/Output Last 24 hours  Intake/Output Summary (Last 24 hours) at 09/09/2023 1122 Last data filed at 09/09/2023 1610 Gross per 24 hour  Intake 750 ml  Output --  Net 750 ml    Labs/Imaging Results for orders placed or performed during the hospital encounter of 09/09/23 (from the past 48 hour(s))  Comprehensive metabolic panel     Status: Abnormal   Collection Time: 09/09/23  5:39 AM  Result Value Ref Range   Sodium 137 135  - 145 mmol/L   Potassium 4.4 3.5 - 5.1 mmol/L   Chloride 102 98 - 111 mmol/L   CO2 17 (L) 22 - 32 mmol/L   Glucose, Bld 178 (H) 70 - 99 mg/dL    Comment: Glucose reference range applies only to samples taken after fasting for at least 8 hours.   BUN 23 8 - 23 mg/dL   Creatinine, Ser 9.60 (H) 0.44 - 1.00 mg/dL   Calcium 8.5 (L) 8.9 - 10.3 mg/dL   Total Protein 5.4 (L) 6.5 - 8.1 g/dL   Albumin 3.0 (L) 3.5 - 5.0 g/dL   AST 20 15 - 41 U/L   ALT 14 0 - 44 U/L   Alkaline Phosphatase 63 38 - 126 U/L   Total Bilirubin 0.3 0.3 - 1.2 mg/dL   GFR, Estimated 49 (L) >60 mL/min    Comment: (NOTE) Calculated using the CKD-EPI Creatinine Equation (2021)    Anion gap 18 (H) 5 - 15    Comment: Performed at [facility], [address], [place], [place] [address]  Lipase, blood     Status: None   Collection Time: 09/09/23  5:39 AM  Result Value Ref Range   Lipase 42 11 - 51 U/L    Comment: Performed at [facility], [address] [place], [place] [address]  Troponin I (High Sensitivity)     Status: None   Collection Time: 09/09/23  5:39 AM  Result Value Ref Range   Troponin I (High Sensitivity) 6 <18 ng/L    Comment: (NOTE) Elevated high sensitivity troponin I (hsTnI) values and significant  changes across serial measurements may suggest ACS but many other  chronic and acute conditions are known to elevate hsTnI results.  Refer to the "Links" section for chest pain algorithms and additional  guidance. Performed at [facility], [address], [place], [place] [address]   CBC with Differential     Status: Abnormal   Collection Time: 09/09/23  5:39 AM  Result Value Ref Range   WBC 13.9 (H) 4.0 - 10.5 K/uL   RBC 3.49 (L) 3.87 - 5.11 MIL/uL   Hemoglobin 9.8 (L) 12.0 - 15.0 g/dL   HCT 56.2 (L) 13.0 - 86.5 %   MCV 94.6 80.0 - 100.0 fL   MCH 28.1 26.0 - 34.0 pg   MCHC 29.7 (L) 30.0 - 36.0 g/dL   RDW 78.4 (H) 69.6 - 29.5 %   Platelets 356 150 - 400 K/uL   nRBC 0.0 0.0 -  0.2 %   Neutrophils Relative % 82 %   Neutro Abs 11.4 (H) 1.7 - 7.7 K/uL   Lymphocytes Relative 10 %   Lymphs Abs 1.4 0.7 - 4.0 K/uL   Monocytes Relative 7 %   Monocytes Absolute 0.9 0.1 - 1.0 K/uL   Eosinophils Relative 0 %   Eosinophils Absolute 0.0 0.0 - 0.5 K/uL   Basophils Relative 0 %   Basophils Absolute 0.1 0.0 - 0.1 K/uL  Immature Granulocytes 1 %   Abs Immature Granulocytes 0.17 (H) 0.00 - 0.07 K/uL    Comment: Performed at [facility], [address], [place], [place] [address]  Ethanol     Status: None   Collection Time: 09/09/23  5:39 AM  Result Value Ref Range   Alcohol, Ethyl (B) <10 <10 mg/dL    Comment: (NOTE) Lowest detectable limit for serum alcohol is 10 mg/dL.  For medical purposes only. Performed at [facility], [address], [place], [place] [address]   Protime-INR     Status: None   Collection Time: 09/09/23  5:39 AM  Result Value Ref Range   Prothrombin Time 13.1 11.4 - 15.2 seconds   INR 1.0 0.8 - 1.2    Comment: (NOTE) INR goal varies based on device and disease states. Performed at [facility], [address], [place], [place] [address]   APTT     Status: Abnormal   Collection Time: 09/09/23  5:39 AM  Result Value Ref Range   aPTT 22 (L) 24 - 36 seconds    Comment: Performed at [facility], [address], [place], [place] [address]  I-stat chem 8, ED     Status: Abnormal   Collection Time: 09/09/23  5:43 AM  Result Value Ref Range   Sodium 135 135 - 145 mmol/L   Potassium 4.6 3.5 - 5.1 mmol/L   Chloride 106 98 - 111 mmol/L   BUN 23 8 - 23 mg/dL   Creatinine, Ser 2.84 (H) 0.44 - 1.00 mg/dL   Glucose, Bld 132 (H) 70 - 99 mg/dL    Comment: Glucose reference range applies only to samples taken after fasting for at least 8 hours.   Calcium, Ion 0.98 (L) 1.15 - 1.40 mmol/L   TCO2 18 (L) 22 - 32 mmol/L   Hemoglobin 10.5 (L) 12.0 - 15.0 g/dL   HCT 44.0 (L) 10.2 - 72.5 %  Troponin I (High Sensitivity)     Status:  None   Collection Time: 09/09/23  6:45 AM  Result Value Ref Range   Troponin I (High Sensitivity) 4 <18 ng/L    Comment: (NOTE) Elevated high sensitivity troponin I (hsTnI) values and significant  changes across serial measurements may suggest ACS but many other  chronic and acute conditions are known to elevate hsTnI results.  Refer to the "Links" section for chest pain algorithms and additional  guidance. Performed at [facility], [address], [place], [place] [address]    CT ABDOMEN PELVIS WO CONTRAST  Result Date: 09/09/2023 CLINICAL DATA:  Bowel obstruction suspected. EXAM: CT ABDOMEN AND PELVIS WITHOUT CONTRAST TECHNIQUE: Multidetector CT imaging of the abdomen and pelvis was performed following the standard protocol without IV contrast. RADIATION DOSE REDUCTION: This exam was performed according to the departmental dose-optimization program which includes automated exposure control, adjustment of the mA and/or kV according to patient size and/or use of iterative reconstruction technique. COMPARISON:  CTA abdomen and pelvis 08/08/2023 FINDINGS: Lower chest: Mild atelectasis in the lung bases. Coronary atherosclerosis. Normal heart size. No pleural effusion. Hepatobiliary: No focal liver abnormality is identified on this unenhanced study. Gallbladder wall thickening is likely secondary to nondistention, without acute pericholecystic inflammation. No biliary dilatation. Pancreas: Unremarkable. Spleen: Unremarkable. Adrenals/Urinary Tract: Unremarkable adrenal glands. Excreted IV contrast material from today's earlier head and neck CTA in the renal collecting systems, ureters, and bladder. No hydronephrosis. 1.5 cm low-density lesion in the right kidney  compatible with a cyst with no follow-up imaging recommended. Small amount of nondependent gas in the bladder, query recent catheterization. Stomach/Bowel: The stomach is unremarkable. Mild left-sided colonic diverticulosis is noted.  There is no evidence of bowel obstruction or inflammation. Vascular/Lymphatic: Abdominal aortic atherosclerosis without aneurysm. No enlarged lymph nodes. Reproductive: Uterus and bilateral adnexa are unremarkable. Other: No ascites or pneumoperitoneum. Small fat-containing umbilical hernia. Musculoskeletal: No suspicious osseous lesion. Widespread thoracolumbar facet arthrosis. Moderate disc degeneration about the thoracolumbar junction. IMPRESSION: 1. No acute abnormality identified in the abdomen or pelvis. 2.  Aortic Atherosclerosis (ICD10-I70.0). Electronically Signed   By: Sebastian Ache M.D.   On: 09/09/2023 08:45   MR ANGIO HEAD WO CONTRAST  Result Date: 09/09/2023 CLINICAL DATA:  Code stroke. 77 year old female last known well 2100 hours. Abnormal speech and right side deficits. Distal basilar occlusion or near occlusion by CTA. EXAM: MRA HEAD WITHOUT CONTRAST TECHNIQUE: Angiographic images of the Circle of Willis were acquired using MRA technique without intravenous contrast. COMPARISON:  CTA head and neck, brain MRI this morning. Previous intracranial MRA 12/10/2011. FINDINGS: Intermittently motion degraded exam. Anterior circulation: Antegrade flow maintained in the distal cervical ICAs and both ICA siphons. Patent carotid termini, MCA and ACA origins. Motion artifact again degrades detail of the MCA and ACA branches, and multifocal irregularity of those vessels could be artifact. But no anterior circulation large vessel occlusion is evident. Posterior circulation: Dominant right vertebral artery again noted which supplies the basilar. Distal right vertebral, right PICA and proximal basilar arteries are patent. Mid basilar flow signal maintained although more irregular compared to the 2012 MRA. And there is absent flow signal at the basilar tip and right PCA origin concordant with the CTA peer in Stu they (series 17, image 96. Flow signal there was maintained in 2012. Likewise, SCA origins are not  identified now, were visible in 2012. Right PCA flow is reconstituted as by CTA. And there is a fetal type left PCA origin again noted. Anatomic variants: Dominant right vertebral artery supplies the basilar. Fetal type left PCA origin. Other: Brain MRI today is reported separately. IMPRESSION: 1. Confirmed Poor flow/Occlusion of the Basilar Artery tip and Right PCA origin, concordant with CTA today. This is new since 2012, and SCA origins also no longer visible by MRA or CTA. See MRI at the same time reported separately. 2. Motion degraded exam. Other evidence of intracranial atherosclerosis, but no other large vessel occlusion identified. Electronically Signed   By: Odessa Fleming M.D.   On: 09/09/2023 05:52   MR BRAIN WO CONTRAST  Result Date: 09/09/2023 CLINICAL DATA:  Code stroke. 77 year old female last known well 2100 hours. Abnormal speech and right side deficits. Distal basilar occlusion or near occlusion by CTA. EXAM: MRI HEAD WITHOUT CONTRAST TECHNIQUE: Multiplanar, multiecho pulse sequences of the brain and surrounding structures were obtained without intravenous contrast. COMPARISON:  CTA head and neck today.  Brain MRI 08/16/2022. FINDINGS: Brain: No restricted diffusion or evidence of acute infarction. Stable cerebral volume. No midline shift, mass effect, evidence of mass lesion, ventriculomegaly, extra-axial collection or acute intracranial hemorrhage. Cervicomedullary junction and pituitary are within normal limits. Patchy bilateral cerebral white matter T2 and FLAIR hyperintensity which is most pronounced in the periatrial regions appears stable from last year. Motion artifact in the posterior fossa on FLAIR imaging today. But T1 and T2 signal in the brainstem and cerebellum appears stable from last year, within normal limits for age. No chronic cerebral blood products on SWI. Vascular: Major intracranial  vascular flow voids do appear stable compared to the 2023 MRI, including diminutive and  irregular appearance of the distal basilar artery on series 13, image 10. Skull and upper cervical spine: Stable, negative. Sinuses/Orbits: Chronic paranasal sinus disease. Stable postoperative appearance of the orbits. Other: Mastoids remain well aerated. Visible internal auditory structures appear normal. Negative visible scalp and face. IMPRESSION: 1. No evidence of acute ischemia and essentially stable noncontrast MRI appearance of the brain since last year. This was discussed by telephone with Dr. Erick Blinks on 09/09/2023 at 0515 hours. 2. MRA is reported separately. Electronically Signed   By: Odessa Fleming M.D.   On: 09/09/2023 05:42   CT ANGIO HEAD NECK W WO CM (CODE STROKE)  Result Date: 09/09/2023 CLINICAL DATA:  Code stroke. 77 year old female last known well 2100 hours. Abnormal speech and right side deficits. EXAM: CT ANGIOGRAPHY HEAD AND NECK WITH AND WITHOUT CONTRAST TECHNIQUE: Multidetector CT imaging of the head and neck was performed using the standard protocol during bolus administration of intravenous contrast. Multiplanar CT image reconstructions and MIPs were obtained to evaluate the vascular anatomy. Carotid stenosis measurements (when applicable) are obtained utilizing NASCET criteria, using the distal internal carotid diameter as the denominator. RADIATION DOSE REDUCTION: This exam was performed according to the departmental dose-optimization program which includes automated exposure control, adjustment of the mA and/or kV according to patient size and/or use of iterative reconstruction technique. CONTRAST:  75mL OMNIPAQUE IOHEXOL 350 MG/ML SOLN COMPARISON:  Plain head CT 0436 hours today. Prior CTA neck 07/01/2020. Previous brain MRA 12/10/2011. And brain MRI last year 08/16/2022. FINDINGS: CTA NECK Skeleton: Chronic paranasal sinus disease. Chronic cervical spine degeneration. Facet arthropathy and C4-C5 facet ankylosis on the right side. Bilateral cervicothoracic facet ankylosis. No  acute osseous abnormality identified. Upper chest: Negative. Other neck: No acute finding. Aortic arch: Calcified aortic atherosclerosis.  3 vessel arch. Right carotid system: No significant brachiocephalic artery or right CCA origin plaque or stenosis. Tortuous proximal right CCA. Bulky soft and calcified plaque along the medial vessel proximal to the bifurcation. Up to 50 % stenosis with respect to the distal vessel (series 7, image 113). Bulky calcified plaque at the right ICA origin and bulb with high-grade stenosis numerically estimated at 69 % with respect to the distal vessel (series 5, image 201). The vessel remains patent to the skull base. This does not appear significantly changed since 2021. Left carotid system: Left CCA origin plaque without stenosis. Patent stent beginning in the distal left CCA and continuing through the ICA, with substantially improved flow there compared to the 2021 CTA. No significant stenosis to the skull base. Vertebral arteries: Proximal right subclavian artery calcified plaque without stenosis. Normal right vertebral artery origin. Dominant appearing right vertebral artery is patent and mildly tortuous to the skull base without stenosis. Proximal left subclavian artery plaque and also motion artifact. No high-grade proximal subclavian artery stenosis. Left vertebral artery is non dominant and highly diminutive. There is chronic calcified plaque at its origin. Diminutive left vertebral artery appears stable since 2021, remains patent although diminutive to the skull base. CTA HEAD Posterior circulation: Dominant right vertebral V4 segment with normal PICA origins supplies the proximal basilar. Diminutive left vertebral artery functionally terminates in PICA as before. Proximal basilar artery is patent, but there is distal basilar stenosis or thrombosis along a segment of 3-4 mm as seen on series 10, image 26. This level was not included on the 2021 CTA, but was patent on the  2012 MR  a. there is reconstitution of the right PCA origin, with diminutive or absent right posterior communicating artery on that side. There is a contralateral fetal type left PCA origin. Bilateral PCA branches remain patent with mild irregularity. SCA origins are not identified. Anterior circulation: Both ICA siphons are patent. Both ICA siphons are calcified. On the left there is mild to moderate siphon stenosis maximal in the supraclinoid segment. On the right there is also mild to moderate supraclinoid stenosis. Normal left posterior communicating artery origin. Patent carotid termini. Dominant appearing right ACA A1, similar to 2012 MRA. Normal anterior communicating artery. Patent MCA origins. Bilateral MCA and ACA branch detail is motion degraded. Both MCA bifurcations appear to remain patent. But MCA and ACA branch detail is otherwise limited. Delayed phase images were obtained (series 14), and seem to demonstrate fairly symmetric A2 and M2 enhancement. Venous sinuses: Patent on the delayed images. Anatomic variants: Chronically dominant right and diminutive left vertebral arteries. Left vertebral functionally terminates in PICA. Fetal type left PCA origin. Dominant right ACA A1. Review of the MIP images confirms the above findings Preliminary report of this exam was discussed by telephone with Dr. Tollie Eth on 09/09/2023 at 04:50 . IMPRESSION: 1. 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 Nonvisualization of the distal 3-4 mm of the Basilar, new since 2012. Involvement of the right PCA origin, but right PCA is reconstituted. SCAs are not identified. Stat Brain MRI being obtained for further evaluation, see that report. This was discussed by telephone with Dr. Tollie Eth on 09/09/2023 at 04:50. 2. Motion degraded MCA and ACA branches with no other strong evidence of ELVO. 3. Substantially improved cervical Left ICA following stenting. High-grade contralateral proximal right ICA  stenosis estimated at 69%, not significantly changed from 2021. Calcified ICA siphons with mild to moderate bilateral siphon stenosis. 4. Aortic Atherosclerosis (ICD10-I70.0). Cervical spine degeneration and facet ankylosis. Electronically Signed   By: Odessa Fleming M.D.   On: 09/09/2023 05:26   DG Chest Port 1 View  Result Date: 09/09/2023 CLINICAL DATA:  Code stroke. 77 year old female last known well 2100 hours. Abnormal speech and right side deficits. EXAM: PORTABLE CHEST 1 VIEW COMPARISON:  CTA neck today.  Portable chest 08/15/2022. FINDINGS: Portable AP semi upright view at 0418 hours. Lower lung volumes compared to last year. Platelike left lung base opacity. But elsewhere Allowing for portable technique the lungs are clear. Upper lungs appeared clear on CTA today. Normal cardiac size and mediastinal contours. Visualized tracheal air column is within normal limits. No acute osseous abnormality identified. Paucity of bowel gas. IMPRESSION: Lower lung volumes with basilar atelectasis, especially on the left. Electronically Signed   By: Odessa Fleming M.D.   On: 09/09/2023 04:52   CT Head Code Stroke WO Contrast  Result Date: 09/09/2023 CLINICAL DATA:  Code stroke. 77 year old female last known well 2100 hours. Abnormal speech and right side deficits. EXAM: CT HEAD WITHOUT CONTRAST TECHNIQUE: Contiguous axial images were obtained from the base of the skull through the vertex without intravenous contrast. RADIATION DOSE REDUCTION: This exam was performed according to the departmental dose-optimization program which includes automated exposure control, adjustment of the mA and/or kV according to patient size and/or use of iterative reconstruction technique. COMPARISON:  Brain MRI 08/16/2022.  Head CT 08/15/2022. FINDINGS: Brain: Stable cerebral volume, normal for age. Stable gray-white matter differentiation throughout the brain. No midline shift, ventriculomegaly, mass effect, evidence of mass lesion, intracranial  hemorrhage or evidence of cortically based acute infarction. Vascular: Calcified  atherosclerosis at the skull base. No suspicious intracranial vascular hyperdensity. Skull: Stable.  No acute osseous abnormality identified. 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 Chronic right ethmoidectomy. Tympanic cavities and mastoids remain clear. Other: No gaze deviation. Visualized scalp soft tissues are within normal limits. ASPECTS [facility] Stroke Program Early CT Score) Total score (0-10 with 10 being normal): 10 IMPRESSION: 1. Stable and negative for age noncontrast CT appearance of the brain. ASPECTS 10. 2. The above communicated to Dr. Derry Lory at 4:44 am on 09/09/2023 by text page via the [facility] messaging system. 3. Chronic paranasal sinus disease. Electronically Signed   By: Odessa Fleming M.D.   On: 09/09/2023 04:45    Pending Labs Unresulted Labs (From admission, onward)     Start     Ordered   09/09/23 0959  Lipid panel  Add-on,   AD        09/09/23 0958   09/09/23 0959  Hemoglobin A1c  Add-on,   AD        09/09/23 0958   09/09/23 0423  Urine rapid drug screen (hosp performed)  Once,   STAT        09/09/23 0423   09/09/23 0410  Urinalysis, w/ Reflex to Culture (Infection Suspected) -Urine, Clean Catch  Once,   URGENT       Question:  Specimen Source  Answer:  Urine, Clean Catch   09/09/23 0410            Vitals/Pain Today's Vitals   09/09/23 1005 09/09/23 1045 09/09/23 1111 09/09/23 1115  BP:  138/66    Pulse:  63    Resp:  15    Temp:    98.3 F (36.8 C)  TempSrc:    Oral  SpO2:  100%    Weight:      Height:      PainSc: 0-No pain  0-No pain     Isolation Precautions No active isolations  Medications Medications  lactated ringers infusion (has no administration in time range)  sodium chloride 0.9 % bolus 500 mL (0 mLs Intravenous Stopped 09/09/23 0727)  iohexol (OMNIPAQUE) 350 MG/ML injection 75 mL (75 mLs  Intravenous Contrast Given 09/09/23 0454)  ondansetron (ZOFRAN) injection 4 mg (4 mg Intravenous Given 09/09/23 0627)  lactated ringers bolus 500 mL (500 mLs Intravenous New Bag/Given 09/09/23 1110)    Mobility Person assist; pt independently ambulated prior to this visit      Focused Assessments See chart   R Recommendations: See Admitting Provider Note  Report given to:   Additional Notes: see chart

## 2023-09-09 NOTE — ED Notes (Signed)
Attending provider at bedside

## 2023-09-09 NOTE — ED Notes (Signed)
Assisted pt with calling her husband because she was looking for her hearing aids.  Husband states they are at home & he will bring them as well as her glasses.

## 2023-09-09 NOTE — ED Notes (Signed)
Patient transported to MRI with RN and cardiac monitor

## 2023-09-09 NOTE — Consult Note (Signed)
Chief Complaint: Patient was seen in consultation today for right sided weakness.  Referring Physician(s): Dr. Keturah Barre  Supervising Physician: Julieanne Cotton  Patient Status: [facility] - ED  History of Present Illness: [name] is a 77 y.o. female with history of DM, fibromyalgia, HTN, diverticulitis, s/p L ICA stent placement by Vascular surgery in 2021 who recently traveled to [facility] 2 week ago.  While on vacation he developed acute appendicitis and underwent emergent lap appendectomy.  She was discharged home however reports she developed a reaction to the diflucan she was given prior to leaving the hospital.  She initially had hives and nausea, however this improved.  She completed course of antibiotics and was slowly improving at home.  Her husband also tested positive for Covid upon return home.  She was asymptomatic but self-tested and returned positive so she took prophylactic Paxlovid.  All things considered, she was recovering from these events when suddenly this AM she developed acute nasuea, vomiting, and lower extremity weakness.  She presented to the hospital with ongoing right-sided symptoms.  Preliminary imaging negative for acute stroke, however due to ongoing concern for status of known prior stenosis and stent, diagnostic angiogram has been recommended by Neurology.  Case reviewed and approved by Dr. Corliss Skains.   Patient assessed at bedside alongside Dr. Corliss Skains.  Patient confirms history above.  She is reportedly back to baseline.  She has no complaints at present. She is agreeable to diagnostic angiogram as recommended by team.   Past Medical History:  Diagnosis Date   Alcohol abuse    CAP (community acquired pneumonia) 05/05/2018   Carotid artery stenosis    Depression    Diabetes mellitus    Diverticulitis    Essential hypertension 08/20/2007   Qualifier: Diagnosis of   By: Jonny Ruiz MD, Len Blalock        Fibromyalgia    Hepatitis 08/28/2014    Hyperlipidemia 08/20/2007   Qualifier: Diagnosis of   By: Jonny Ruiz MD, Len Blalock        Hypertension    Myalgia and myositis 03/28/2012    Past Surgical History:  Procedure Laterality Date   BUNIONECTOMY     CAROTID PTA/STENT INTERVENTION Left 08/07/2020   Procedure: CAROTID PTA/STENT INTERVENTION;  Surgeon: Annice Needy, MD;  Location: [facility] INVASIVE CV LAB;  Service: Cardiovascular;  Laterality: Left;   CESAREAN SECTION     COLONOSCOPY     COLONOSCOPY WITH ESOPHAGOGASTRODUODENOSCOPY (EGD)     COLONOSCOPY WITH PROPOFOL N/A 02/18/2021   Procedure: COLONOSCOPY WITH PROPOFOL;  Surgeon: Toledo, Boykin Nearing, MD;  Location: [facility] ENDOSCOPY;  Service: Gastroenterology;  Laterality: N/A;   ESOPHAGOGASTRODUODENOSCOPY (EGD) WITH PROPOFOL N/A 02/18/2021   Procedure: ESOPHAGOGASTRODUODENOSCOPY (EGD) WITH PROPOFOL;  Surgeon: Toledo, Boykin Nearing, MD;  Location: [facility] ENDOSCOPY;  Service: Gastroenterology;  Laterality: N/A;   TONSILLECTOMY      Allergies: Latex, Lipitor [atorvastatin calcium], Codeine, Pentazocine lactate, and Sulfonamide derivatives  Medications: Prior to Admission medications   Medication Sig Start Date End Date Taking? Authorizing Provider  amphetamine-dextroamphetamine (ADDERALL XR) 20 MG 24 hr capsule Take 40 mg by mouth every morning. 06/29/23   [provider]  aspirin EC 81 MG EC tablet Take 1 tablet (81 mg total) by mouth daily. Swallow whole. 08/08/20   Stegmayer, Kimberly A, PA-C  BYSTOLIC 5 MG tablet TAKE 1 TABLET DAILY Patient taking differently: Take 5 mg by mouth daily. 09/26/15   Carlus Pavlov, MD  clopidogrel (PLAVIX) 75 MG tablet TAKE 1 TABLET BY MOUTH  EVERY DAY Patient taking differently: Take 75 mg by mouth daily. 07/18/23   Georgiana Spinner, NP  diphenoxylate-atropine (LOMOTIL) 2.5-0.025 MG tablet Take 1 tablet by mouth 4 (four) times daily as needed for diarrhea or loose stools.  07/08/10   [provider]  Dulaglutide (TRULICITY) 3 MG/0.5ML SOPN Inject 3 mg  into the skin once a week. 06/23/23     empagliflozin (JARDIANCE) 25 MG TABS tablet Take 25 mg by mouth daily.    [provider]  EPINEPHrine 0.3 mg/0.3 mL IJ SOAJ injection Inject 0.3 mg into the muscle as needed for anaphylaxis.    [provider]  glucose blood (BAYER CONTOUR NEXT TEST) test strip Use to test blood sugar 3 times daily as instructed. Dx code: E11.65 02/10/15   Carlus Pavlov, MD  Lancets (ACCU-CHEK MULTICLIX) lancets Use to test blood sugar 3 times daily as instructed. Dx: E11.65 02/10/15   Carlus Pavlov, MD  lisinopril (PRINIVIL,ZESTRIL) 5 MG tablet TAKE 1 TABLET BY MOUTH EVERY DAY Patient taking differently: Take 5 mg by mouth daily. 07/14/15   Carlus Pavlov, MD  LORazepam (ATIVAN) 0.5 MG tablet Take 1 tablet (0.5 mg total) by mouth daily as needed for anxiety. 09/16/14   Doris Cheadle, MD  metFORMIN (GLUCOPHAGE) 1000 MG tablet Take 1,000 mg by mouth every evening. 10/19/21   [provider]  omeprazole (PRILOSEC) 40 MG capsule Take 40 mg by mouth daily. 08/30/18   [provider]  pregabalin (LYRICA) 300 MG capsule Take 300 mg by mouth 2 (two) times daily. 08/05/20   [provider]     Family History  Problem Relation Age of Onset   Cancer Mother    Heart disease Father    Heart disease Sister     Social History   Socioeconomic History   Marital status: Married    Spouse name: Not on file   Number of children: Not on file   Years of education: Not on file   Highest education level: Not on file  Occupational History   Not on file  Tobacco Use   Smoking status: Every Day    Current packs/day: 0.00    Types: Cigarettes, E-cigarettes    Last attempt to quit: 03/11/2010    Years since quitting: 13.5   Smokeless tobacco: Never  Vaping Use   Vaping status: Former  Substance and Sexual Activity   Alcohol use: Not Currently   Drug use: No    Comment: Pt denies    Sexual activity: Never  Other Topics Concern    Not on file  Social History Narrative   Not on file   Social Determinants of Health   Financial Resource Strain: Not on file  Food Insecurity: Not on file  Transportation Needs: Not on file  Physical Activity: Not on file  Stress: Not on file  Social Connections: Not on file     Review of Systems: A 12 point ROS discussed and pertinent positives are indicated in the HPI above.  All other systems are negative.  Review of Systems  Constitutional:  Negative for fatigue and fever.  Respiratory:  Negative for cough and shortness of breath.   Cardiovascular:  Negative for chest pain.  Gastrointestinal:  Negative for abdominal pain, nausea and vomiting.  Musculoskeletal:  Negative for back pain.  Psychiatric/Behavioral:  Negative for behavioral problems and confusion.     Vital Signs: BP 138/66   Pulse 63   Temp 98.3 F (36.8 C) (Oral)   Resp  15   Ht 5\' 2"  (1.575 m)   Wt 145 lb (65.8 kg)   SpO2 100%   BMI 26.52 kg/m   Physical Exam Vitals and nursing note reviewed.  Constitutional:      General: She is not in acute distress.    Appearance: Normal appearance. She is not ill-appearing.  HENT:     Mouth/Throat:     Mouth: Mucous membranes are moist.     Pharynx: Oropharynx is clear.  Cardiovascular:     Rate and Rhythm: Normal rate and regular rhythm.  Pulmonary:     Effort: Pulmonary effort is normal. No respiratory distress.     Breath sounds: Normal breath sounds.  Abdominal:     General: Abdomen is flat. Bowel sounds are normal. There is no distension.     Palpations: Abdomen is soft.  Skin:    General: Skin is warm and dry.  Neurological:     General: No focal deficit present.     Mental Status: She is alert and oriented to person, place, and time. Mental status is at baseline.  Psychiatric:        Mood and Affect: Mood normal.        Behavior: Behavior normal.        Thought Content: Thought content normal.        Judgment: Judgment normal.      MD  Evaluation Airway: WNL Heart: WNL Abdomen: WNL Chest/ Lungs: WNL ASA  Classification: 3 Mallampati/Airway Score: Two   Imaging: CT ABDOMEN PELVIS WO CONTRAST  Result Date: 09/09/2023 CLINICAL DATA:  Bowel obstruction suspected. EXAM: CT ABDOMEN AND PELVIS WITHOUT CONTRAST TECHNIQUE: Multidetector CT imaging of the abdomen and pelvis was performed following the standard protocol without IV contrast. RADIATION DOSE REDUCTION: This exam was performed according to the departmental dose-optimization program which includes automated exposure control, adjustment of the mA and/or kV according to patient size and/or use of iterative reconstruction technique. COMPARISON:  CTA abdomen and pelvis 08/08/2023 FINDINGS: Lower chest: Mild atelectasis in the lung bases. Coronary atherosclerosis. Normal heart size. No pleural effusion. Hepatobiliary: No focal liver abnormality is identified on this unenhanced study. Gallbladder wall thickening is likely secondary to nondistention, without acute pericholecystic inflammation. No biliary dilatation. Pancreas: Unremarkable. Spleen: Unremarkable. Adrenals/Urinary Tract: Unremarkable adrenal glands. Excreted IV contrast material from today's earlier head and neck CTA in the renal collecting systems, ureters, and bladder. No hydronephrosis. 1.5 cm low-density lesion in the right kidney compatible with a cyst with no follow-up imaging recommended. Small amount of nondependent gas in the bladder, query recent catheterization. Stomach/Bowel: The stomach is unremarkable. Mild left-sided colonic diverticulosis is noted. There is no evidence of bowel obstruction or inflammation. Vascular/Lymphatic: Abdominal aortic atherosclerosis without aneurysm. No enlarged lymph nodes. Reproductive: Uterus and bilateral adnexa are unremarkable. Other: No ascites or pneumoperitoneum. Small fat-containing umbilical hernia. Musculoskeletal: No suspicious osseous lesion. Widespread thoracolumbar facet  arthrosis. Moderate disc degeneration about the thoracolumbar junction. IMPRESSION: 1. No acute abnormality identified in the abdomen or pelvis. 2.  Aortic Atherosclerosis (ICD10-I70.0). Electronically Signed   By: Sebastian Ache M.D.   On: 09/09/2023 08:45   MR ANGIO HEAD WO CONTRAST  Result Date: 09/09/2023 CLINICAL DATA:  Code stroke. 77 year old female last known well 2100 hours. Abnormal speech and right side deficits. Distal basilar occlusion or near occlusion by CTA. EXAM: MRA HEAD WITHOUT CONTRAST TECHNIQUE: Angiographic images of the Circle of Willis were acquired using MRA technique without intravenous contrast. COMPARISON:  CTA head and neck, brain  MRI this morning. Previous intracranial MRA 12/10/2011. FINDINGS: Intermittently motion degraded exam. Anterior circulation: Antegrade flow maintained in the distal cervical ICAs and both ICA siphons. Patent carotid termini, MCA and ACA origins. Motion artifact again degrades detail of the MCA and ACA branches, and multifocal irregularity of those vessels could be artifact. But no anterior circulation large vessel occlusion is evident. Posterior circulation: Dominant right vertebral artery again noted which supplies the basilar. Distal right vertebral, right PICA and proximal basilar arteries are patent. Mid basilar flow signal maintained although more irregular compared to the 2012 MRA. And there is absent flow signal at the basilar tip and right PCA origin concordant with the CTA peer in Stu they (series 17, image 96. Flow signal there was maintained in 2012. Likewise, SCA origins are not identified now, were visible in 2012. Right PCA flow is reconstituted as by CTA. And there is a fetal type left PCA origin again noted. Anatomic variants: Dominant right vertebral artery supplies the basilar. Fetal type left PCA origin. Other: Brain MRI today is reported separately. IMPRESSION: 1. Confirmed Poor flow/Occlusion of the Basilar Artery tip and Right PCA  origin, concordant with CTA today. This is new since 2012, and SCA origins also no longer visible by MRA or CTA. See MRI at the same time reported separately. 2. Motion degraded exam. Other evidence of intracranial atherosclerosis, but no other large vessel occlusion identified. Electronically Signed   By: Odessa Fleming M.D.   On: 09/09/2023 05:52   MR BRAIN WO CONTRAST  Result Date: 09/09/2023 CLINICAL DATA:  Code stroke. 77 year old female last known well 2100 hours. Abnormal speech and right side deficits. Distal basilar occlusion or near occlusion by CTA. EXAM: MRI HEAD WITHOUT CONTRAST TECHNIQUE: Multiplanar, multiecho pulse sequences of the brain and surrounding structures were obtained without intravenous contrast. COMPARISON:  CTA head and neck today.  Brain MRI 08/16/2022. FINDINGS: Brain: No restricted diffusion or evidence of acute infarction. Stable cerebral volume. No midline shift, mass effect, evidence of mass lesion, ventriculomegaly, extra-axial collection or acute intracranial hemorrhage. Cervicomedullary junction and pituitary are within normal limits. Patchy bilateral cerebral white matter T2 and FLAIR hyperintensity which is most pronounced in the periatrial regions appears stable from last year. Motion artifact in the posterior fossa on FLAIR imaging today. But T1 and T2 signal in the brainstem and cerebellum appears stable from last year, within normal limits for age. No chronic cerebral blood products on SWI. Vascular: Major intracranial vascular flow voids do appear stable compared to the 2023 MRI, including diminutive and irregular appearance of the distal basilar artery on series 13, image 10. Skull and upper cervical spine: Stable, negative. Sinuses/Orbits: Chronic paranasal sinus disease. Stable postoperative appearance of the orbits. Other: Mastoids remain well aerated. Visible internal auditory structures appear normal. Negative visible scalp and face. IMPRESSION: 1. No evidence of acute  ischemia and essentially stable noncontrast MRI appearance of the brain since last year. This was discussed by telephone with Dr. Erick Blinks on 09/09/2023 at 0515 hours. 2. MRA is reported separately. Electronically Signed   By: Odessa Fleming M.D.   On: 09/09/2023 05:42   CT ANGIO HEAD NECK W WO CM (CODE STROKE)  Result Date: 09/09/2023 CLINICAL DATA:  Code stroke. 77 year old female last known well 2100 hours. Abnormal speech and right side deficits. EXAM: CT ANGIOGRAPHY HEAD AND NECK WITH AND WITHOUT CONTRAST TECHNIQUE: Multidetector CT imaging of the head and neck was performed using the standard protocol during bolus administration of intravenous contrast. Multiplanar  CT image reconstructions and MIPs were obtained to evaluate the vascular anatomy. Carotid stenosis measurements (when applicable) are obtained utilizing NASCET criteria, using the distal internal carotid diameter as the denominator. RADIATION DOSE REDUCTION: This exam was performed according to the departmental dose-optimization program which includes automated exposure control, adjustment of the mA and/or kV according to patient size and/or use of iterative reconstruction technique. CONTRAST:  75mL OMNIPAQUE IOHEXOL 350 MG/ML SOLN COMPARISON:  Plain head CT 0436 hours today. Prior CTA neck 07/01/2020. Previous brain MRA 12/10/2011. And brain MRI last year 08/16/2022. FINDINGS: CTA NECK Skeleton: Chronic paranasal sinus disease. Chronic cervical spine degeneration. Facet arthropathy and C4-C5 facet ankylosis on the right side. Bilateral cervicothoracic facet ankylosis. No acute osseous abnormality identified. Upper chest: Negative. Other neck: No acute finding. Aortic arch: Calcified aortic atherosclerosis.  3 vessel arch. Right carotid system: No significant brachiocephalic artery or right CCA origin plaque or stenosis. Tortuous proximal right CCA. Bulky soft and calcified plaque along the medial vessel proximal to the bifurcation. Up to 50 %  stenosis with respect to the distal vessel (series 7, image 113). Bulky calcified plaque at the right ICA origin and bulb with high-grade stenosis numerically estimated at 69 % with respect to the distal vessel (series 5, image 201). The vessel remains patent to the skull base. This does not appear significantly changed since 2021. Left carotid system: Left CCA origin plaque without stenosis. Patent stent beginning in the distal left CCA and continuing through the ICA, with substantially improved flow there compared to the 2021 CTA. No significant stenosis to the skull base. Vertebral arteries: Proximal right subclavian artery calcified plaque without stenosis. Normal right vertebral artery origin. Dominant appearing right vertebral artery is patent and mildly tortuous to the skull base without stenosis. Proximal left subclavian artery plaque and also motion artifact. No high-grade proximal subclavian artery stenosis. Left vertebral artery is non dominant and highly diminutive. There is chronic calcified plaque at its origin. Diminutive left vertebral artery appears stable since 2021, remains patent although diminutive to the skull base. CTA HEAD Posterior circulation: Dominant right vertebral V4 segment with normal PICA origins supplies the proximal basilar. Diminutive left vertebral artery functionally terminates in PICA as before. Proximal basilar artery is patent, but there is distal basilar stenosis or thrombosis along a segment of 3-4 mm as seen on series 10, image 26. This level was not included on the 2021 CTA, but was patent on the 2012 MR a. there is reconstitution of the right PCA origin, with diminutive or absent right posterior communicating artery on that side. There is a contralateral fetal type left PCA origin. Bilateral PCA branches remain patent with mild irregularity. SCA origins are not identified. Anterior circulation: Both ICA siphons are patent. Both ICA siphons are calcified. On the left  there is mild to moderate siphon stenosis maximal in the supraclinoid segment. On the right there is also mild to moderate supraclinoid stenosis. Normal left posterior communicating artery origin. Patent carotid termini. Dominant appearing right ACA A1, similar to 2012 MRA. Normal anterior communicating artery. Patent MCA origins. Bilateral MCA and ACA branch detail is motion degraded. Both MCA bifurcations appear to remain patent. But MCA and ACA branch detail is otherwise limited. Delayed phase images were obtained (series 14), and seem to demonstrate fairly symmetric A2 and M2 enhancement. Venous sinuses: Patent on the delayed images. Anatomic variants: Chronically dominant right and diminutive left vertebral arteries. Left vertebral functionally terminates in PICA. Fetal type left PCA origin. Dominant right ACA  A1. Review of the MIP images confirms the above findings Preliminary report of this exam was discussed by telephone with Dr. Tollie Eth on 09/09/2023 at 04:50 . IMPRESSION: 1. 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 Nonvisualization of the distal 3-4 mm of the Basilar, new since 2012. Involvement of the right PCA origin, but right PCA is reconstituted. SCAs are not identified. Stat Brain MRI being obtained for further evaluation, see that report. This was discussed by telephone with Dr. Tollie Eth on 09/09/2023 at 04:50. 2. Motion degraded MCA and ACA branches with no other strong evidence of ELVO. 3. Substantially improved cervical Left ICA following stenting. High-grade contralateral proximal right ICA stenosis estimated at 69%, not significantly changed from 2021. Calcified ICA siphons with mild to moderate bilateral siphon stenosis. 4. Aortic Atherosclerosis (ICD10-I70.0). Cervical spine degeneration and facet ankylosis. Electronically Signed   By: Odessa Fleming M.D.   On: 09/09/2023 05:26   DG Chest Port 1 View  Result Date: 09/09/2023 CLINICAL DATA:  Code stroke.  77 year old female last known well 2100 hours. Abnormal speech and right side deficits. EXAM: PORTABLE CHEST 1 VIEW COMPARISON:  CTA neck today.  Portable chest 08/15/2022. FINDINGS: Portable AP semi upright view at 0418 hours. Lower lung volumes compared to last year. Platelike left lung base opacity. But elsewhere Allowing for portable technique the lungs are clear. Upper lungs appeared clear on CTA today. Normal cardiac size and mediastinal contours. Visualized tracheal air column is within normal limits. No acute osseous abnormality identified. Paucity of bowel gas. IMPRESSION: Lower lung volumes with basilar atelectasis, especially on the left. Electronically Signed   By: Odessa Fleming M.D.   On: 09/09/2023 04:52   CT Head Code Stroke WO Contrast  Result Date: 09/09/2023 CLINICAL DATA:  Code stroke. 77 year old female last known well 2100 hours. Abnormal speech and right side deficits. EXAM: CT HEAD WITHOUT CONTRAST TECHNIQUE: Contiguous axial images were obtained from the base of the skull through the vertex without intravenous contrast. RADIATION DOSE REDUCTION: This exam was performed according to the departmental dose-optimization program which includes automated exposure control, adjustment of the mA and/or kV according to patient size and/or use of iterative reconstruction technique. COMPARISON:  Brain MRI 08/16/2022.  Head CT 08/15/2022. FINDINGS: Brain: Stable cerebral volume, normal for age. Stable gray-white matter differentiation throughout the brain. No midline shift, ventriculomegaly, mass effect, evidence of mass lesion, intracranial hemorrhage or evidence of cortically based acute infarction. Vascular: Calcified atherosclerosis at the skull base. No suspicious intracranial vascular hyperdensity. Skull: Stable.  No acute osseous abnormality identified. 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 Chronic right ethmoidectomy. Tympanic  cavities and mastoids remain clear. Other: No gaze deviation. Visualized scalp soft tissues are within normal limits. ASPECTS [facility] Stroke Program Early CT Score) Total score (0-10 with 10 being normal): 10 IMPRESSION: 1. Stable and negative for age noncontrast CT appearance of the brain. ASPECTS 10. 2. The above communicated to Dr. Derry Lory at 4:44 am on 09/09/2023 by text page via the [facility] messaging system. 3. Chronic paranasal sinus disease. Electronically Signed   By: Odessa Fleming M.D.   On: 09/09/2023 04:45    Labs:  CBC: Recent Labs    08/09/23 1810 08/10/23 0411 08/11/23 1005 09/09/23 0539 09/09/23 0543  WBC 7.9 8.8 5.5 13.9*  --   HGB 9.3* 9.1* 9.6* 9.8* 10.5*  HCT 28.1* 28.0* 29.3* 33.0* 31.0*  PLT 174 174 206 356  --  COAGS: Recent Labs    08/07/23 0944 09/09/23 0539  INR 0.9 1.0  APTT 26 22*    BMP: Recent Labs    08/09/23 0611 08/10/23 0411 08/11/23 1005 09/09/23 0539 09/09/23 0543  NA 134* 139 138 137 135  K 3.8 3.8 4.1 4.4 4.6  CL 105 106 104 102 106  CO2 21* 25 26 17*  --   GLUCOSE 95 103* 177* 178* 178*  BUN 11 10 10 23 23   CALCIUM 7.9* 8.5* 7.6* 8.5*  --   CREATININE 0.57 0.56 0.79 1.17* 1.20*  GFRNONAA >60 >60 >60 49*  --     LIVER FUNCTION TESTS: Recent Labs    08/07/23 0919 08/08/23 0525 09/09/23 0539  BILITOT 0.6 0.6 0.3  AST 26 12* 20  ALT 11 12 14   ALKPHOS 60 37* 63  PROT 6.2* 4.0* 5.4*  ALBUMIN 3.3* 2.3* 3.0*    TUMOR MARKERS: No results for input(s): "AFPTM", "CEA", "CA199", "CHROMGRNA" in the last 8760 hours.  Assessment and Plan: TIA, right-sided weakness Patient presented with acute onset vomiting, right sided weakness this AM. She has since returned to baseline.  Due to concern for ongoing stenosis diagnostic angiogram requested.  Case reviewed and approved by Dr. Corliss Skains.  Discussed at length with patient at bedside.  She is agreeable to proceed.   Awaiting bed placement.  Currently NPO.   Risks and benefits  were discussed with the patient including, but not limited to bleeding, infection, vascular injury or contrast induced renal failure.  This interventional procedure involves the use of X-rays and because of the nature of the planned procedure, it is possible that we will have prolonged use of X-ray fluoroscopy.  Potential radiation risks to you include (but are not limited to) the following: - A slightly elevated risk for cancer  several years later in life. This risk is typically less than 0.5% percent. This risk is low in comparison to the normal incidence of human cancer, which is 33% for women and 50% for men according to the [employer]. - Radiation induced injury can include skin redness, resembling a rash, tissue breakdown / ulcers and hair loss (which can be temporary or permanent).   The likelihood of either of these occurring depends on the difficulty of the procedure and whether you are sensitive to radiation due to previous procedures, disease, or genetic conditions.   IF your procedure requires a prolonged use of radiation, you will be notified and given written instructions for further action.  It is your responsibility to monitor the irradiated area for the 2 weeks following the procedure and to notify your physician if you are concerned that you have suffered a radiation induced injury.    All of the patient's questions were answered, patient is agreeable to proceed.  Consent signed and in chart.   Thank you for this interesting consult.  I greatly enjoyed meeting KAMERIA CANIZARES and look forward to participating in their care.  A copy of this report was sent to the requesting provider on this date.  Electronically Signed: Hoyt Koch, PA 09/09/2023, 11:27 AM   I spent a total of 40 Minutes    in face to face in clinical consultation, greater than 50% of which was counseling/coordinating care for right sided weakness.

## 2023-09-09 NOTE — ED Notes (Signed)
Neuro team at bedside.

## 2023-09-09 NOTE — ED Notes (Signed)
Patient returned from CT

## 2023-09-09 NOTE — ED Triage Notes (Signed)
Pt BIB EMS, reports that they were called by husband for nausea and vomiting, upon arrival on scene, pt was "gagging", nausea, and "panicky", pt was given zofran for the nausea and vomiting, pt was combative with EMS and was given Versed. Pt arrived with NRB mask and EKG unremarkable

## 2023-09-09 NOTE — Progress Notes (Addendum)
STROKE TEAM PROGRESS NOTE   BRIEF HPI Ms. [name] is a 77 y.o. female with history of diverticulitis, hypertension, hyperlipidemia, myalgias/myositis/fibromyalgia, carotid artery stenosis, right ICA stenosis status post stenting, former alcohol use, and an appendectomy 2 weeks ago presents to [facility] ED with nausea/vomiting and mild right sided numbness, greatest in right lower limb, as well as AMS requiring Versed for aggression.   SIGNIFICANT HOSPITAL EVENTS 9/27: Presentation.  CTA showed progression of basilar artery stenosis and posterior circulation changes.  Diagnostic cerebral catheter angiogram pending INTERIM HISTORY/SUBJECTIVE  On interview with patient in the emergency department, she appears well, no acute distress.  Said that she has not been eating well since her appendectomy 2 weeks ago.  Woke up in the middle of the night with some new nausea/vomiting and dizziness.  Also noted some garbled speech for a couple hours and found that she "could not walk".  On physical exam patient showed right lower extremity numbness.  Physical exam otherwise unremarkable.  OBJECTIVE  CBC    Component Value Date/Time   WBC 13.9 (H) 09/09/2023 0539   RBC 3.49 (L) 09/09/2023 0539   HGB 10.5 (L) 09/09/2023 0543   HCT 31.0 (L) 09/09/2023 0543   PLT 356 09/09/2023 0539   MCV 94.6 09/09/2023 0539   MCH 28.1 09/09/2023 0539   MCHC 29.7 (L) 09/09/2023 0539   RDW 19.8 (H) 09/09/2023 0539   LYMPHSABS 1.4 09/09/2023 0539   MONOABS 0.9 09/09/2023 0539   EOSABS 0.0 09/09/2023 0539   BASOSABS 0.1 09/09/2023 0539    BMET    Component Value Date/Time   NA 135 09/09/2023 0543   K 4.6 09/09/2023 0543   CL 106 09/09/2023 0543   CO2 17 (L) 09/09/2023 0539   GLUCOSE 178 (H) 09/09/2023 0543   GLUCOSE 123 (H) 11/17/2006 1240   BUN 23 09/09/2023 0543   CREATININE 1.20 (H) 09/09/2023 0543   CREATININE 0.81 09/16/2014 1759   CALCIUM 8.5 (L) 09/09/2023 0539   GFRNONAA 49 (L) 09/09/2023  0539   GFRNONAA 76 09/16/2014 1759    IMAGING past 24 hours CT ABDOMEN PELVIS WO CONTRAST  Result Date: 09/09/2023 CLINICAL DATA:  Bowel obstruction suspected. EXAM: CT ABDOMEN AND PELVIS WITHOUT CONTRAST TECHNIQUE: Multidetector CT imaging of the abdomen and pelvis was performed following the standard protocol without IV contrast. RADIATION DOSE REDUCTION: This exam was performed according to the departmental dose-optimization program which includes automated exposure control, adjustment of the mA and/or kV according to patient size and/or use of iterative reconstruction technique. COMPARISON:  CTA abdomen and pelvis 08/08/2023 FINDINGS: Lower chest: Mild atelectasis in the lung bases. Coronary atherosclerosis. Normal heart size. No pleural effusion. Hepatobiliary: No focal liver abnormality is identified on this unenhanced study. Gallbladder wall thickening is likely secondary to nondistention, without acute pericholecystic inflammation. No biliary dilatation. Pancreas: Unremarkable. Spleen: Unremarkable. Adrenals/Urinary Tract: Unremarkable adrenal glands. Excreted IV contrast material from today's earlier head and neck CTA in the renal collecting systems, ureters, and bladder. No hydronephrosis. 1.5 cm low-density lesion in the right kidney compatible with a cyst with no follow-up imaging recommended. Small amount of nondependent gas in the bladder, query recent catheterization. Stomach/Bowel: The stomach is unremarkable. Mild left-sided colonic diverticulosis is noted. There is no evidence of bowel obstruction or inflammation. Vascular/Lymphatic: Abdominal aortic atherosclerosis without aneurysm. No enlarged lymph nodes. Reproductive: Uterus and bilateral adnexa are unremarkable. Other: No ascites or pneumoperitoneum. Small fat-containing umbilical hernia. Musculoskeletal: No suspicious osseous lesion. Widespread thoracolumbar facet arthrosis. Moderate disc  degeneration about the thoracolumbar junction.  IMPRESSION: 1. No acute abnormality identified in the abdomen or pelvis. 2.  Aortic Atherosclerosis (ICD10-I70.0). Electronically Signed   By: Sebastian Ache M.D.   On: 09/09/2023 08:45   MR ANGIO HEAD WO CONTRAST  Result Date: 09/09/2023 CLINICAL DATA:  Code stroke. 77 year old female last known well 2100 hours. Abnormal speech and right side deficits. Distal basilar occlusion or near occlusion by CTA. EXAM: MRA HEAD WITHOUT CONTRAST TECHNIQUE: Angiographic images of the Circle of Willis were acquired using MRA technique without intravenous contrast. COMPARISON:  CTA head and neck, brain MRI this morning. Previous intracranial MRA 12/10/2011. FINDINGS: Intermittently motion degraded exam. Anterior circulation: Antegrade flow maintained in the distal cervical ICAs and both ICA siphons. Patent carotid termini, MCA and ACA origins. Motion artifact again degrades detail of the MCA and ACA branches, and multifocal irregularity of those vessels could be artifact. But no anterior circulation large vessel occlusion is evident. Posterior circulation: Dominant right vertebral artery again noted which supplies the basilar. Distal right vertebral, right PICA and proximal basilar arteries are patent. Mid basilar flow signal maintained although more irregular compared to the 2012 MRA. And there is absent flow signal at the basilar tip and right PCA origin concordant with the CTA peer in Stu they (series 17, image 96. Flow signal there was maintained in 2012. Likewise, SCA origins are not identified now, were visible in 2012. Right PCA flow is reconstituted as by CTA. And there is a fetal type left PCA origin again noted. Anatomic variants: Dominant right vertebral artery supplies the basilar. Fetal type left PCA origin. Other: Brain MRI today is reported separately. IMPRESSION: 1. Confirmed Poor flow/Occlusion of the Basilar Artery tip and Right PCA origin, concordant with CTA today. This is new since 2012, and SCA origins  also no longer visible by MRA or CTA. See MRI at the same time reported separately. 2. Motion degraded exam. Other evidence of intracranial atherosclerosis, but no other large vessel occlusion identified. Electronically Signed   By: Odessa Fleming M.D.   On: 09/09/2023 05:52   MR BRAIN WO CONTRAST  Result Date: 09/09/2023 CLINICAL DATA:  Code stroke. 77 year old female last known well 2100 hours. Abnormal speech and right side deficits. Distal basilar occlusion or near occlusion by CTA. EXAM: MRI HEAD WITHOUT CONTRAST TECHNIQUE: Multiplanar, multiecho pulse sequences of the brain and surrounding structures were obtained without intravenous contrast. COMPARISON:  CTA head and neck today.  Brain MRI 08/16/2022. FINDINGS: Brain: No restricted diffusion or evidence of acute infarction. Stable cerebral volume. No midline shift, mass effect, evidence of mass lesion, ventriculomegaly, extra-axial collection or acute intracranial hemorrhage. Cervicomedullary junction and pituitary are within normal limits. Patchy bilateral cerebral white matter T2 and FLAIR hyperintensity which is most pronounced in the periatrial regions appears stable from last year. Motion artifact in the posterior fossa on FLAIR imaging today. But T1 and T2 signal in the brainstem and cerebellum appears stable from last year, within normal limits for age. No chronic cerebral blood products on SWI. Vascular: Major intracranial vascular flow voids do appear stable compared to the 2023 MRI, including diminutive and irregular appearance of the distal basilar artery on series 13, image 10. Skull and upper cervical spine: Stable, negative. Sinuses/Orbits: Chronic paranasal sinus disease. Stable postoperative appearance of the orbits. Other: Mastoids remain well aerated. Visible internal auditory structures appear normal. Negative visible scalp and face. IMPRESSION: 1. No evidence of acute ischemia and essentially stable noncontrast MRI appearance of the brain  since last year. This was discussed by telephone with Dr. Erick Blinks on 09/09/2023 at 0515 hours. 2. MRA is reported separately. Electronically Signed   By: Odessa Fleming M.D.   On: 09/09/2023 05:42   CT ANGIO HEAD NECK W WO CM (CODE STROKE)  Result Date: 09/09/2023 CLINICAL DATA:  Code stroke. 77 year old female last known well 2100 hours. Abnormal speech and right side deficits. EXAM: CT ANGIOGRAPHY HEAD AND NECK WITH AND WITHOUT CONTRAST TECHNIQUE: Multidetector CT imaging of the head and neck was performed using the standard protocol during bolus administration of intravenous contrast. Multiplanar CT image reconstructions and MIPs were obtained to evaluate the vascular anatomy. Carotid stenosis measurements (when applicable) are obtained utilizing NASCET criteria, using the distal internal carotid diameter as the denominator. RADIATION DOSE REDUCTION: This exam was performed according to the departmental dose-optimization program which includes automated exposure control, adjustment of the mA and/or kV according to patient size and/or use of iterative reconstruction technique. CONTRAST:  75mL OMNIPAQUE IOHEXOL 350 MG/ML SOLN COMPARISON:  Plain head CT 0436 hours today. Prior CTA neck 07/01/2020. Previous brain MRA 12/10/2011. And brain MRI last year 08/16/2022. FINDINGS: CTA NECK Skeleton: Chronic paranasal sinus disease. Chronic cervical spine degeneration. Facet arthropathy and C4-C5 facet ankylosis on the right side. Bilateral cervicothoracic facet ankylosis. No acute osseous abnormality identified. Upper chest: Negative. Other neck: No acute finding. Aortic arch: Calcified aortic atherosclerosis.  3 vessel arch. Right carotid system: No significant brachiocephalic artery or right CCA origin plaque or stenosis. Tortuous proximal right CCA. Bulky soft and calcified plaque along the medial vessel proximal to the bifurcation. Up to 50 % stenosis with respect to the distal vessel (series 7, image 113). Bulky  calcified plaque at the right ICA origin and bulb with high-grade stenosis numerically estimated at 69 % with respect to the distal vessel (series 5, image 201). The vessel remains patent to the skull base. This does not appear significantly changed since 2021. Left carotid system: Left CCA origin plaque without stenosis. Patent stent beginning in the distal left CCA and continuing through the ICA, with substantially improved flow there compared to the 2021 CTA. No significant stenosis to the skull base. Vertebral arteries: Proximal right subclavian artery calcified plaque without stenosis. Normal right vertebral artery origin. Dominant appearing right vertebral artery is patent and mildly tortuous to the skull base without stenosis. Proximal left subclavian artery plaque and also motion artifact. No high-grade proximal subclavian artery stenosis. Left vertebral artery is non dominant and highly diminutive. There is chronic calcified plaque at its origin. Diminutive left vertebral artery appears stable since 2021, remains patent although diminutive to the skull base. CTA HEAD Posterior circulation: Dominant right vertebral V4 segment with normal PICA origins supplies the proximal basilar. Diminutive left vertebral artery functionally terminates in PICA as before. Proximal basilar artery is patent, but there is distal basilar stenosis or thrombosis along a segment of 3-4 mm as seen on series 10, image 26. This level was not included on the 2021 CTA, but was patent on the 2012 MR a. there is reconstitution of the right PCA origin, with diminutive or absent right posterior communicating artery on that side. There is a contralateral fetal type left PCA origin. Bilateral PCA branches remain patent with mild irregularity. SCA origins are not identified. Anterior circulation: Both ICA siphons are patent. Both ICA siphons are calcified. On the left there is mild to moderate siphon stenosis maximal in the supraclinoid  segment. On the right there is also mild to  moderate supraclinoid stenosis. Normal left posterior communicating artery origin. Patent carotid termini. Dominant appearing right ACA A1, similar to 2012 MRA. Normal anterior communicating artery. Patent MCA origins. Bilateral MCA and ACA branch detail is motion degraded. Both MCA bifurcations appear to remain patent. But MCA and ACA branch detail is otherwise limited. Delayed phase images were obtained (series 14), and seem to demonstrate fairly symmetric A2 and M2 enhancement. Venous sinuses: Patent on the delayed images. Anatomic variants: Chronically dominant right and diminutive left vertebral arteries. Left vertebral functionally terminates in PICA. Fetal type left PCA origin. Dominant right ACA A1. Review of the MIP images confirms the above findings Preliminary report of this exam was discussed by telephone with Dr. Tollie Eth on 09/09/2023 at 04:50 . IMPRESSION: 1. 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 Nonvisualization of the distal 3-4 mm of the Basilar, new since 2012. Involvement of the right PCA origin, but right PCA is reconstituted. SCAs are not identified. Stat Brain MRI being obtained for further evaluation, see that report. This was discussed by telephone with Dr. Tollie Eth on 09/09/2023 at 04:50. 2. Motion degraded MCA and ACA branches with no other strong evidence of ELVO. 3. Substantially improved cervical Left ICA following stenting. High-grade contralateral proximal right ICA stenosis estimated at 69%, not significantly changed from 2021. Calcified ICA siphons with mild to moderate bilateral siphon stenosis. 4. Aortic Atherosclerosis (ICD10-I70.0). Cervical spine degeneration and facet ankylosis. Electronically Signed   By: Odessa Fleming M.D.   On: 09/09/2023 05:26   DG Chest Port 1 View  Result Date: 09/09/2023 CLINICAL DATA:  Code stroke. 77 year old female last known well 2100 hours. Abnormal speech and right side  deficits. EXAM: PORTABLE CHEST 1 VIEW COMPARISON:  CTA neck today.  Portable chest 08/15/2022. FINDINGS: Portable AP semi upright view at 0418 hours. Lower lung volumes compared to last year. Platelike left lung base opacity. But elsewhere Allowing for portable technique the lungs are clear. Upper lungs appeared clear on CTA today. Normal cardiac size and mediastinal contours. Visualized tracheal air column is within normal limits. No acute osseous abnormality identified. Paucity of bowel gas. IMPRESSION: Lower lung volumes with basilar atelectasis, especially on the left. Electronically Signed   By: Odessa Fleming M.D.   On: 09/09/2023 04:52   CT Head Code Stroke WO Contrast  Result Date: 09/09/2023 CLINICAL DATA:  Code stroke. 77 year old female last known well 2100 hours. Abnormal speech and right side deficits. EXAM: CT HEAD WITHOUT CONTRAST TECHNIQUE: Contiguous axial images were obtained from the base of the skull through the vertex without intravenous contrast. RADIATION DOSE REDUCTION: This exam was performed according to the departmental dose-optimization program which includes automated exposure control, adjustment of the mA and/or kV according to patient size and/or use of iterative reconstruction technique. COMPARISON:  Brain MRI 08/16/2022.  Head CT 08/15/2022. FINDINGS: Brain: Stable cerebral volume, normal for age. Stable gray-white matter differentiation throughout the brain. No midline shift, ventriculomegaly, mass effect, evidence of mass lesion, intracranial hemorrhage or evidence of cortically based acute infarction. Vascular: Calcified atherosclerosis at the skull base. No suspicious intracranial vascular hyperdensity. Skull: Stable.  No acute osseous abnormality identified. 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 Chronic right ethmoidectomy. Tympanic cavities and mastoids remain clear. Other: No gaze deviation. Visualized scalp  soft tissues are within normal limits. ASPECTS [facility] Stroke Program Early CT Score) Total score (0-10 with 10 being normal): 10 IMPRESSION: 1. Stable and negative for age noncontrast  CT appearance of the brain. ASPECTS 10. 2. The above communicated to Dr. Derry Lory at 4:44 am on 09/09/2023 by text page via the [facility] messaging system. 3. Chronic paranasal sinus disease. Electronically Signed   By: Odessa Fleming M.D.   On: 09/09/2023 04:45    Vitals:   09/09/23 0715 09/09/23 0815 09/09/23 1045 09/09/23 1115  BP:  128/61 138/66   Pulse:  65 63   Resp:  17 15   Temp:    98.3 F (36.8 C)  TempSrc:    Oral  SpO2: 99% 98% 100%   Weight:      Height:         PHYSICAL EXAM General:  Alert, well-nourished, well-developed [name] in no acute distress Psych:  Mood and affect appropriate for situation CV: Regular rate and rhythm on monitor Respiratory:  Regular, unlabored respirations on room air GI: Abdomen soft and nontender   NEURO:  Mental Status: AA&Ox3, patient is able to give clear and coherent history Speech/Language: speech is without dysarthria or aphasia.  Naming, repetition, fluency, and comprehension intact.  Cranial Nerves:  II: PERRL. Visual fields full.  III, IV, VI: EOMI. Eyelids elevate symmetrically.  V: Sensation is intact to light touch and symmetrical to face.  VII: Face is symmetrical resting and smiling VIII: hearing intact to voice. IX, X: Palate elevates symmetrically. Phonation is normal.  ZO:[id number] shrug 5/5. XII: tongue is midline without fasciculations. Motor: 3/5 strength in right lower limb, 5 out of 5 strength to all muscle groups otherwise tested.  Tone: is normal and bulk is normal Sensation- some reduced sensation in right lower extremity Coordination: FTN intact bilaterally Gait- deferred   ASSESSMENT/PLAN  TIA of posterior circulation in the setting of significant basilar artery stenosis and dehydration secondary to vomiting/diarrhea     Of note, patient was recently seen at [facility] 08/07/2023 - 08/11/2023 and had to be admitted to the ICU in the setting of hematochezia leading to hemorrhagic shock and poor perfusion with extensive high-grade stenosis of proximal celiac artery, IMA, left lower pole renal artery, among others.  Likely diverticular bleed.  GI cleared her for DAPT aspirin/Plavix at home.  Later, 2 weeks ago patient had an appendectomy while on vacation in [place].  Has not eaten or hydrated well since that time, likely leading to dehydration and symptomatic TIA from high-grade distal basilar artery stenosis recommend modifying DAPT from aspirin/Plavix to aspirin/Brilinta in outpatient setting, given failure of the former regimen.  Given IV LR 500 mL bolus x1, begun on continuous IV LR 75 cc/hour.  IR has agreed to perform a diagnostic angiogram, currently underway.  Code Stroke CT head: No acute abnormality. ASPECTS 10.  Chronic paranasal sinus disease CTA head & neck: Distal basilar artery occlusion/near occlusion.  Nonvisualization of distal 3-4 mm of basilar, new since 2012.  No LVO.  Left cervical ICA status post stent. MRI: No evidence of acute ischemia.  Stable. MRA: Occlusion/poor flow of basilar artery tip and right PCA origin. Carotid Doppler: 40 to 59% stenosis in the right ICA, left ICA 1 to 39% stenosis. 2D Echo: Pending LDL 117 HgbA1c 5.7 VTE prophylaxis -none Aspirin 81 mg and Plavix 75 mg at home. Therapy recommendations: Appreciate PT/OT recs Disposition: TBD  Hypertension Home meds: Lisinopril 5 mg Stable Blood Pressure Goal: BP less than 220/110, permissive hypertension for first 48 hours  Hyperlipidemia Home meds: None LDL 117, goal < 70 Consider adding atorvastatin 40 Continue statin at discharge  Tobacco Abuse  Remote history of tobacco use  History of substance Abuse Denies current alcohol use  Dysphagia Patient has post-stroke dysphagia, SLP consulted    Diet   Diet NPO  time specified   Advance diet as tolerated  Other Stroke Risk Factors Hypertension Hyperlipidemia Type 2 diabetes History of alcohol abuse Poor perfusion Significant stenosis  Hospital day # 0  Luiz Iron, MD Psychiatry resident, PGY 1 I have personally obtained history,examined this patient, reviewed notes, independently viewed imaging studies, participated in medical decision making and plan of care.ROS completed by me personally and pertinent positives fully documented  I have made any additions or clarifications directly to the above note. Agree with note above.  Patient with multivessel disease in the past with left ICA stenting presented with posterior circulation TIA with nausea vomiting dizziness gait ataxia likely precipitated by dehydration in the setting of recent GI upset following appendix surgery with underlying high-grade distal basilar stenosis.  Recommend IV hydration and permissive hypertension.  Change aspirin and Plavix to aspirin and Brilinta.  Diagnostic cerebral catheter angiogram to see if she has a lesion amenable to angioplasty and stenting.  Long discussion with patient and Dr. Corliss Skains and answered questions.  Greater than 50% time during this 50-minute visit was spent on counseling and coordination of care about her symptomatic basilar stenosis and discussion about evaluation and treatment and answering questions.  Delia Heady, MD Medical Director [facility] Stroke Center Pager: [phone] 09/09/2023 4:22 PM  To contact Stroke Continuity provider, please refer to [email or web address]. After hours, contact General Neurology

## 2023-09-09 NOTE — ED Notes (Signed)
Patient transported to CT 

## 2023-09-09 NOTE — Progress Notes (Signed)
Patient with hx of recent appendectomy 2 week ago now with abdominal pain and note leukocytosis. No fever or chills. Admit for tia due to Severe tapered stenosis of the distal basilar artery extending into the right PCA P1 segment..  However now with concern for possible abdominal path Lactic ordered and well as ct abd/pelvis

## 2023-09-09 NOTE — ED Notes (Signed)
Patient transported to CT with RN and cardiac monitor

## 2023-09-09 NOTE — ED Notes (Signed)
Pt able to stand with assistance, states she feels weak & numb on right side.

## 2023-09-09 NOTE — ED Notes (Signed)
Pt had stool in bedpan unable to get urine

## 2023-09-09 NOTE — Procedures (Signed)
INR.  Status post for recent cerebral arteriogram.  Prior right CFA approach.  Findings.  1.Severe tapered stenosis of the distal basilar artery extending into the right PCA P1 segment..  2 Approximately 50% stenosis of the proximal right ICA.  Fatima Sanger MD.

## 2023-09-10 ENCOUNTER — Inpatient Hospital Stay (HOSPITAL_COMMUNITY): Payer: Medicare HMO

## 2023-09-10 DIAGNOSIS — G459 Transient cerebral ischemic attack, unspecified: Secondary | ICD-10-CM

## 2023-09-10 DIAGNOSIS — I651 Occlusion and stenosis of basilar artery: Secondary | ICD-10-CM | POA: Diagnosis not present

## 2023-09-10 DIAGNOSIS — N3080 Other cystitis without hematuria: Secondary | ICD-10-CM | POA: Diagnosis not present

## 2023-09-10 LAB — ECHOCARDIOGRAM COMPLETE BUBBLE STUDY
AR max vel: 2.25 cm2
AV Area VTI: 2.14 cm2
AV Area mean vel: 2.26 cm2
AV Mean grad: 4 mm[Hg]
AV Peak grad: 7.6 mm[Hg]
Ao pk vel: 1.38 m/s
Area-P 1/2: 2.12 cm2
S' Lateral: 2.9 cm

## 2023-09-10 LAB — GLUCOSE, CAPILLARY
Glucose-Capillary: 115 mg/dL — ABNORMAL HIGH (ref 70–99)
Glucose-Capillary: 126 mg/dL — ABNORMAL HIGH (ref 70–99)
Glucose-Capillary: 108 mg/dL — ABNORMAL HIGH (ref 70–99)
Glucose-Capillary: 108 mg/dL — ABNORMAL HIGH (ref 70–99)

## 2023-09-10 LAB — PROCALCITONIN: Procalcitonin: 0.34 ng/mL

## 2023-09-10 LAB — MRSA NEXT GEN BY PCR, NASAL: MRSA by PCR Next Gen: DETECTED — AB

## 2023-09-10 MED ORDER — TICAGRELOR 90 MG PO TABS
90.0000 mg | ORAL_TABLET | Freq: Two times a day (BID) | ORAL | Status: DC
  Administered 2023-09-10 – 2023-09-19 (×19): 90 mg via ORAL
  Filled 2023-09-10 (×19): qty 1

## 2023-09-10 MED ORDER — ROSUVASTATIN CALCIUM 20 MG PO TABS
20.0000 mg | ORAL_TABLET | Freq: Every day | ORAL | Status: DC
  Administered 2023-09-10 – 2023-09-20 (×10): 20 mg via ORAL
  Filled 2023-09-10 (×10): qty 1

## 2023-09-10 MED ORDER — ONDANSETRON HCL 4 MG/2ML IJ SOLN
4.0000 mg | Freq: Four times a day (QID) | INTRAMUSCULAR | Status: DC | PRN
  Administered 2023-09-10 – 2023-09-17 (×7): 4 mg via INTRAVENOUS
  Filled 2023-09-10 (×8): qty 2

## 2023-09-10 MED ORDER — MUPIROCIN 2 % EX OINT
1.0000 | TOPICAL_OINTMENT | Freq: Two times a day (BID) | CUTANEOUS | Status: AC
  Administered 2023-09-10 – 2023-09-14 (×10): 1 via NASAL
  Filled 2023-09-10: qty 22

## 2023-09-10 MED ORDER — CHLORHEXIDINE GLUCONATE CLOTH 2 % EX PADS: 6.0000 | MEDICATED_PAD | Freq: Every day | CUTANEOUS | Status: DC

## 2023-09-10 MED ORDER — INSULIN ASPART 100 UNIT/ML IJ SOLN
0.0000 [IU] | Freq: Three times a day (TID) | INTRAMUSCULAR | Status: DC
  Administered 2023-09-11 – 2023-09-15 (×5): 1 [IU] via SUBCUTANEOUS
  Administered 2023-09-16: 2 [IU] via SUBCUTANEOUS
  Administered 2023-09-17: 1 [IU] via SUBCUTANEOUS
  Administered 2023-09-18: 3 [IU] via SUBCUTANEOUS
  Administered 2023-09-18: 1 [IU] via SUBCUTANEOUS
  Administered 2023-09-19: 3 [IU] via SUBCUTANEOUS

## 2023-09-10 MED ORDER — ASPIRIN 81 MG PO CHEW
81.0000 mg | CHEWABLE_TABLET | Freq: Every day | ORAL | Status: DC
  Administered 2023-09-10 – 2023-09-19 (×10): 81 mg via ORAL
  Filled 2023-09-10 (×10): qty 1

## 2023-09-10 MED ORDER — PIPERACILLIN-TAZOBACTAM 3.375 G IVPB
3.3750 g | Freq: Three times a day (TID) | INTRAVENOUS | Status: DC
  Administered 2023-09-10 – 2023-09-14 (×13): 3.375 g via INTRAVENOUS
  Filled 2023-09-10 (×15): qty 50

## 2023-09-10 MED ORDER — ENOXAPARIN SODIUM 40 MG/0.4ML IJ SOSY
40.0000 mg | PREFILLED_SYRINGE | INTRAMUSCULAR | Status: DC
  Administered 2023-09-10 – 2023-09-18 (×9): 40 mg via SUBCUTANEOUS
  Filled 2023-09-10 (×9): qty 0.4

## 2023-09-10 NOTE — Consult Note (Signed)
Urology Consult   Reason for consult: emphysematous cystitis  History of Present Illness: [name] is a 77 y.o. F admitted to [facility] 9/27 for possible stroke  She had CT scans x 2 yesterday. Each shows a distended bladder with air in the bladder wall, consistent with emphysematous cystitis  Patient is resting comfortably at the time of my exam.     Past Medical History:  Diagnosis Date   Alcohol abuse    CAP (community acquired pneumonia) 05/05/2018   Carotid artery stenosis    Depression    Diabetes mellitus    Diverticulitis    Essential hypertension 08/20/2007   Qualifier: Diagnosis of   By: Jonny Ruiz MD, Len Blalock        Fibromyalgia    Hepatitis 08/28/2014   Hyperlipidemia 08/20/2007   Qualifier: Diagnosis of   By: Jonny Ruiz MD, Len Blalock        Hypertension    Myalgia and myositis 03/28/2012    Past Surgical History:  Procedure Laterality Date   BUNIONECTOMY     CAROTID PTA/STENT INTERVENTION Left 08/07/2020   Procedure: CAROTID PTA/STENT INTERVENTION;  Surgeon: Annice Needy, MD;  Location: [facility] INVASIVE CV [facility];  Service: Cardiovascular;  Laterality: Left;   CESAREAN SECTION     COLONOSCOPY     COLONOSCOPY WITH ESOPHAGOGASTRODUODENOSCOPY (EGD)     COLONOSCOPY WITH PROPOFOL N/A 02/18/2021   Procedure: COLONOSCOPY WITH PROPOFOL;  Surgeon: Toledo, Boykin Nearing, MD;  Location: [facility] ENDOSCOPY;  Service: Gastroenterology;  Laterality: N/A;   ESOPHAGOGASTRODUODENOSCOPY (EGD) WITH PROPOFOL N/A 02/18/2021   Procedure: ESOPHAGOGASTRODUODENOSCOPY (EGD) WITH PROPOFOL;  Surgeon: Toledo, Boykin Nearing, MD;  Location: [facility] ENDOSCOPY;  Service: Gastroenterology;  Laterality: N/A;   TONSILLECTOMY       Current Hospital Medications:  Home meds:  No current facility-administered medications on file prior to encounter.   Current Outpatient Medications on File Prior to Encounter  Medication Sig Dispense Refill   amphetamine-dextroamphetamine (ADDERALL XR) 20 MG 24 hr capsule Take 40 mg by mouth every  morning.     aspirin EC 81 MG EC tablet Take 1 tablet (81 mg total) by mouth daily. Swallow whole. 90 tablet 3   B Complex Vitamins (B COMPLEX-B12) TABS Take 1 tablet by mouth at bedtime.     BYSTOLIC 5 MG tablet TAKE 1 TABLET DAILY (Patient taking differently: Take 5 mg by mouth daily.) 90 tablet 0   clopidogrel (PLAVIX) 75 MG tablet TAKE 1 TABLET BY MOUTH EVERY DAY (Patient taking differently: Take 75 mg by mouth daily.) 90 tablet 2   diphenoxylate-atropine (LOMOTIL) 2.5-0.025 MG tablet Take 1 tablet by mouth 4 (four) times daily as needed for diarrhea or loose stools.      Dulaglutide (TRULICITY) 3 MG/0.5ML SOPN Inject 3 mg into the skin once a week. 6 mL 1   empagliflozin (JARDIANCE) 25 MG TABS tablet Take 25 mg by mouth daily.     EPINEPHrine 0.3 mg/0.3 mL IJ SOAJ injection Inject 0.3 mg into the muscle as needed for anaphylaxis.     lisinopril (PRINIVIL,ZESTRIL) 5 MG tablet TAKE 1 TABLET BY MOUTH EVERY DAY (Patient taking differently: Take 5 mg by mouth daily.) 90 tablet 0   metFORMIN (GLUCOPHAGE) 1000 MG tablet Take 1,000 mg by mouth every evening.     omeprazole (PRILOSEC) 40 MG capsule Take 40 mg by mouth daily.  2   pregabalin (LYRICA) 300 MG capsule Take 300 mg by mouth 2 (two) times daily.     glucose blood (BAYER CONTOUR  NEXT TEST) test strip Use to test blood sugar 3 times daily as instructed. Dx code: E11.65 100 each 11   Lancets (ACCU-CHEK MULTICLIX) lancets Use to test blood sugar 3 times daily as instructed. Dx: E11.65 100 each 11     Scheduled Meds:  aspirin  81 mg Oral Daily   Chlorhexidine Gluconate Cloth  6 each Topical Q0600   mupirocin ointment  1 Application Nasal BID   ticagrelor  90 mg Oral BID   Continuous Infusions:  lactated ringers 75 mL/hr at 09/09/23 2337   piperacillin-tazobactam (ZOSYN)  IV 3.375 g (09/10/23 1041)   PRN Meds:.HYDROmorphone (DILAUDID) injection, iohexol, morphine injection, ondansetron (ZOFRAN) IV  Allergies:  Allergies  Allergen  Reactions   Diflucan [Fluconazole] Nausea And Vomiting   Latex Hives   Lipitor [Atorvastatin Calcium] Other (See Comments)    Muscle aches   Codeine Rash    And nausea    Pentazocine Lactate Rash   Sulfonamide Derivatives Rash    Family History  Problem Relation Age of Onset   Cancer Mother    Heart disease Father    Heart disease Sister     Social History:  reports that she has been smoking cigarettes and e-cigarettes. She has never used smokeless tobacco. She reports that she does not currently use alcohol. She reports that she does not use drugs.  ROS: A complete review of systems was performed.  All systems are negative except for pertinent findings as noted.  Physical Exam:  Vital signs in last 24 hours: Temp:  [97.8 F (36.6 C)-98.4 F (36.9 C)] 98 F (36.7 C) (09/28 1114) Pulse Rate:  [64-76] 72 (09/28 1114) Resp:  [10-19] 17 (09/28 1114) BP: (109-154)/(41-73) 142/49 (09/28 1114) SpO2:  [92 %-100 %] 93 % (09/28 1114) Constitutional:  Alert and oriented, No acute distress Cardiovascular: Regular rate and rhythm Respiratory: Normal respiratory effort, Lungs clear bilaterally GI: Abdomen is soft, nontender, nondistended, no abdominal masses Neurologic: Grossly intact, no focal deficits Psychiatric: Normal mood and affect  Purewick in place with ~400 cc of dark yellow output  Laboratory Data:  Recent Labs    09/09/23 0539 09/09/23 0543  WBC 13.9*  --   HGB 9.8* 10.5*  HCT 33.0* 31.0*  PLT 356  --     Recent Labs    09/09/23 0539 09/09/23 0543  NA 137 135  K 4.4 4.6  CL 102 106  GLUCOSE 178* 178*  BUN 23 23  CALCIUM 8.5*  --   CREATININE 1.17* 1.20*     Results for orders placed or performed during the hospital encounter of 09/09/23 (from the past 24 hour(s))  Urinalysis, w/ Reflex to Culture (Infection Suspected) -Urine, Clean Catch     Status: Abnormal   Collection Time: 09/09/23  6:30 PM  Result Value Ref Range   Specimen Source URINE,  CLEAN CATCH    Color, Urine YELLOW YELLOW   APPearance HAZY (A) CLEAR   Specific Gravity, Urine 1.035 (H) 1.005 - 1.030   pH 5.0 5.0 - 8.0   Glucose, UA >=500 (A) NEGATIVE mg/dL   Hgb urine dipstick MODERATE (A) NEGATIVE   Bilirubin Urine NEGATIVE NEGATIVE   Ketones, ur NEGATIVE NEGATIVE mg/dL   Protein, ur NEGATIVE NEGATIVE mg/dL   Nitrite NEGATIVE NEGATIVE   Leukocytes,Ua LARGE (A) NEGATIVE   RBC / HPF 21-50 0 - 5 RBC/hpf   WBC, UA >50 0 - 5 WBC/hpf   Bacteria, UA NONE SEEN NONE SEEN   Squamous Epithelial / HPF  0-5 0 - 5 /HPF   Mucus PRESENT   Urine rapid drug screen (hosp performed)     Status: Abnormal   Collection Time: 09/09/23  6:30 PM  Result Value Ref Range   Opiates POSITIVE (A) NONE DETECTED   Cocaine NONE DETECTED NONE DETECTED   Benzodiazepines POSITIVE (A) NONE DETECTED   Amphetamines POSITIVE (A) NONE DETECTED   Tetrahydrocannabinol POSITIVE (A) NONE DETECTED   Barbiturates NONE DETECTED NONE DETECTED  Urine Culture     Status: None (Preliminary result)   Collection Time: 09/09/23  6:30 PM   Specimen: Urine, Clean Catch  Result Value Ref Range   Specimen Description URINE, CLEAN CATCH    Special Requests      NONE Reflexed from [id number] Performed at [facility], [address], [place], [place] [address]    Culture PENDING    Report Status PENDING   Lactic acid, plasma     Status: None   Collection Time: 09/09/23  7:07 PM  Result Value Ref Range   Lactic Acid, Venous 0.8 0.5 - 1.9 mmol/L  Procalcitonin     Status: None   Collection Time: 09/09/23  7:07 PM  Result Value Ref Range   Procalcitonin 0.40 ng/mL  Sedimentation rate     Status: None   Collection Time: 09/09/23  7:07 PM  Result Value Ref Range   Sed Rate 22 0 - 22 mm/hr  C-reactive protein     Status: Abnormal   Collection Time: 09/09/23  7:07 PM  Result Value Ref Range   CRP 2.0 (H) <1.0 mg/dL  MRSA Next Gen by PCR, Nasal     Status: Abnormal   Collection Time: 09/09/23  8:05 PM    Specimen: Nasal Mucosa; Nasal Swab  Result Value Ref Range   MRSA by PCR Next Gen DETECTED (A) NOT DETECTED  Lactic acid, plasma     Status: None   Collection Time: 09/09/23  9:14 PM  Result Value Ref Range   Lactic Acid, Venous 1.5 0.5 - 1.9 mmol/L  Glucose, capillary     Status: Abnormal   Collection Time: 09/09/23  9:43 PM  Result Value Ref Range   Glucose-Capillary 119 (H) 70 - 99 mg/dL   Comment 1 Notify RN    Comment 2 Document in Chart   Glucose, capillary     Status: Abnormal   Collection Time: 09/10/23  6:04 AM  Result Value Ref Range   Glucose-Capillary 115 (H) 70 - 99 mg/dL   Comment 1 Notify RN   Procalcitonin     Status: None   Collection Time: 09/10/23 10:39 AM  Result Value Ref Range   Procalcitonin 0.34 ng/mL  Glucose, capillary     Status: Abnormal   Collection Time: 09/10/23 11:19 AM  Result Value Ref Range   Glucose-Capillary 126 (H) 70 - 99 mg/dL   Comment 1 Notify RN    Recent Results (from the past 240 hour(s))  Urine Culture     Status: None (Preliminary result)   Collection Time: 09/09/23  6:30 PM   Specimen: Urine, Clean Catch  Result Value Ref Range Status   Specimen Description URINE, CLEAN CATCH  Final   Special Requests   Final    NONE Reflexed from [id number] Performed at [facility], [address] [place], [place] [address]    Culture PENDING  Incomplete   Report Status PENDING  Incomplete  MRSA Next Gen by PCR, Nasal     Status: Abnormal  Collection Time: 09/09/23  8:05 PM   Specimen: Nasal Mucosa; Nasal Swab  Result Value Ref Range Status   MRSA by PCR Next Gen DETECTED (A) NOT DETECTED Final    Comment: RESULT CALLED TO, READ BACK BY AND VERIFIED WITH: F OKASOR,RN@[id number]  09/10/23 MK (NOTE) The GeneXpert MRSA Assay (FDA approved for NASAL specimens only), is one component of a comprehensive MRSA colonization surveillance program. It is not intended to diagnose MRSA infection nor to guide or monitor treatment for MRSA  infections. Test performance is not FDA approved in patients less than 61 years old. Performed at [facility], [address], [place], [place] [address]     Renal Function: Recent Labs    09/09/23 0539 09/09/23 0543  CREATININE 1.17* 1.20*   Estimated Creatinine Clearance: 36.1 mL/min (A) (by C-G formula based on SCr of 1.2 mg/dL (H)).  Radiologic Imaging: CT ABDOMEN PELVIS W CONTRAST  Result Date: 09/09/2023 CLINICAL DATA:  Abdomen pain EXAM: CT ABDOMEN AND PELVIS WITH CONTRAST TECHNIQUE: Multidetector CT imaging of the abdomen and pelvis was performed using the standard protocol following bolus administration of intravenous contrast. RADIATION DOSE REDUCTION: This exam was performed according to the departmental dose-optimization program which includes automated exposure control, adjustment of the mA and/or kV according to patient size and/or use of iterative reconstruction technique. CONTRAST:  75mL OMNIPAQUE IOHEXOL 350 MG/ML SOLN COMPARISON:  CT 09/09/2023, CT  angiography 08/08/2023, 08/07/2023 FINDINGS: Lower chest: Lung bases demonstrate no acute airspace disease. Hepatobiliary: Excreted contrast in the gallbladder. No biliary dilatation or focal hepatic abnormality Pancreas: Unremarkable. No pancreatic ductal dilatation or surrounding inflammatory changes. Spleen: Normal in size without focal abnormality. Adrenals/Urinary Tract: Adrenal glands are within normal limits. Cyst in the right kidney, no imaging follow-up is recommended. Excreted contrast within the renal collecting systems and bladder. Interim development of diffuse bladder wall thickening, perivesical stranding and gas within the wall of the bladder. Stomach/Bowel: The stomach is nonenlarged. No dilated small bowel. Suspicion of mild wall thickening and inflammation at the ileocecal junction and proximal ascending colon, series 3 image 47 through 55. Diverticular disease of the left colon. Vascular/Lymphatic: Moderate  aortic atherosclerosis. No aneurysm. No suspicious lymph nodes. Reproductive: Uterus unremarkable. Hyperdense material/contrast within the vagina. No adnexal mass Other: No free air or pelvic effusion. Small foci of gas along the bilateral pelvic sidewall, suspect that these are intravascular. Musculoskeletal: No acute or suspicious osseous abnormality. IMPRESSION: 1. Interim development of diffuse bladder wall thickening, perivesical stranding and gas within the wall of the bladder, consistent with emphysematous cystitis. 2. Suspicion of mild wall thickening and inflammation at the ileocecal junction and proximal ascending colon which may be due to distal ileitis/colitis from infection, inflammatory bowel disease, or ischemia. 3. Diverticular disease of the left colon without acute inflammatory change. 4. Hyperdense contrast within the vagina, no obvious fistula seen. Question related to vaginal reflux. Aortic Atherosclerosis (ICD10-I70.0) and Emphysema (ICD10-J43.9). These results will be called to the ordering clinician or representative by the Radiologist Assistant, and communication documented in the PACS or [employer]. Electronically Signed   By: Jasmine Pang M.D.   On: 09/09/2023 23:36   CT ABDOMEN PELVIS WO CONTRAST  Result Date: 09/09/2023 CLINICAL DATA:  Bowel obstruction suspected. EXAM: CT ABDOMEN AND PELVIS WITHOUT CONTRAST TECHNIQUE: Multidetector CT imaging of the abdomen and pelvis was performed following the standard protocol without IV contrast. RADIATION DOSE REDUCTION: This exam was performed according to the departmental dose-optimization program which includes automated exposure control, adjustment  of the mA and/or kV according to patient size and/or use of iterative reconstruction technique. COMPARISON:  CTA abdomen and pelvis 08/08/2023 FINDINGS: Lower chest: Mild atelectasis in the lung bases. Coronary atherosclerosis. Normal heart size. No pleural effusion. Hepatobiliary: No  focal liver abnormality is identified on this unenhanced study. Gallbladder wall thickening is likely secondary to nondistention, without acute pericholecystic inflammation. No biliary dilatation. Pancreas: Unremarkable. Spleen: Unremarkable. Adrenals/Urinary Tract: Unremarkable adrenal glands. Excreted IV contrast material from today's earlier head and neck CTA in the renal collecting systems, ureters, and bladder. No hydronephrosis. 1.5 cm low-density lesion in the right kidney compatible with a cyst with no follow-up imaging recommended. Small amount of nondependent gas in the bladder, query recent catheterization. Stomach/Bowel: The stomach is unremarkable. Mild left-sided colonic diverticulosis is noted. There is no evidence of bowel obstruction or inflammation. Vascular/Lymphatic: Abdominal aortic atherosclerosis without aneurysm. No enlarged lymph nodes. Reproductive: Uterus and bilateral adnexa are unremarkable. Other: No ascites or pneumoperitoneum. Small fat-containing umbilical hernia. Musculoskeletal: No suspicious osseous lesion. Widespread thoracolumbar facet arthrosis. Moderate disc degeneration about the thoracolumbar junction. IMPRESSION: 1. No acute abnormality identified in the abdomen or pelvis. 2.  Aortic Atherosclerosis (ICD10-I70.0). Electronically Signed   By: Sebastian Ache M.D.   On: 09/09/2023 08:45   MR ANGIO HEAD WO CONTRAST  Result Date: 09/09/2023 CLINICAL DATA:  Code stroke. 77 year old female last known well 2100 hours. Abnormal speech and right side deficits. Distal basilar occlusion or near occlusion by CTA. EXAM: MRA HEAD WITHOUT CONTRAST TECHNIQUE: Angiographic images of the Circle of Willis were acquired using MRA technique without intravenous contrast. COMPARISON:  CTA head and neck, brain MRI this morning. Previous intracranial MRA 12/10/2011. FINDINGS: Intermittently motion degraded exam. Anterior circulation: Antegrade flow maintained in the distal cervical ICAs and both  ICA siphons. Patent carotid termini, MCA and ACA origins. Motion artifact again degrades detail of the MCA and ACA branches, and multifocal irregularity of those vessels could be artifact. But no anterior circulation large vessel occlusion is evident. Posterior circulation: Dominant right vertebral artery again noted which supplies the basilar. Distal right vertebral, right PICA and proximal basilar arteries are patent. Mid basilar flow signal maintained although more irregular compared to the 2012 MRA. And there is absent flow signal at the basilar tip and right PCA origin concordant with the CTA peer in Stu they (series 17, image 96. Flow signal there was maintained in 2012. Likewise, SCA origins are not identified now, were visible in 2012. Right PCA flow is reconstituted as by CTA. And there is a fetal type left PCA origin again noted. Anatomic variants: Dominant right vertebral artery supplies the basilar. Fetal type left PCA origin. Other: Brain MRI today is reported separately. IMPRESSION: 1. Confirmed Poor flow/Occlusion of the Basilar Artery tip and Right PCA origin, concordant with CTA today. This is new since 2012, and SCA origins also no longer visible by MRA or CTA. See MRI at the same time reported separately. 2. Motion degraded exam. Other evidence of intracranial atherosclerosis, but no other large vessel occlusion identified. Electronically Signed   By: Odessa Fleming M.D.   On: 09/09/2023 05:52   MR BRAIN WO CONTRAST  Result Date: 09/09/2023 CLINICAL DATA:  Code stroke. 77 year old female last known well 2100 hours. Abnormal speech and right side deficits. Distal basilar occlusion or near occlusion by CTA. EXAM: MRI HEAD WITHOUT CONTRAST TECHNIQUE: Multiplanar, multiecho pulse sequences of the brain and surrounding structures were obtained without intravenous contrast. COMPARISON:  CTA head and neck today.  Brain MRI 08/16/2022. FINDINGS: Brain: No restricted diffusion or evidence of acute infarction.  Stable cerebral volume. No midline shift, mass effect, evidence of mass lesion, ventriculomegaly, extra-axial collection or acute intracranial hemorrhage. Cervicomedullary junction and pituitary are within normal limits. Patchy bilateral cerebral white matter T2 and FLAIR hyperintensity which is most pronounced in the periatrial regions appears stable from last year. Motion artifact in the posterior fossa on FLAIR imaging today. But T1 and T2 signal in the brainstem and cerebellum appears stable from last year, within normal limits for age. No chronic cerebral blood products on SWI. Vascular: Major intracranial vascular flow voids do appear stable compared to the 2023 MRI, including diminutive and irregular appearance of the distal basilar artery on series 13, image 10. Skull and upper cervical spine: Stable, negative. Sinuses/Orbits: Chronic paranasal sinus disease. Stable postoperative appearance of the orbits. Other: Mastoids remain well aerated. Visible internal auditory structures appear normal. Negative visible scalp and face. IMPRESSION: 1. No evidence of acute ischemia and essentially stable noncontrast MRI appearance of the brain since last year. This was discussed by telephone with Dr. Erick Blinks on 09/09/2023 at 0515 hours. 2. MRA is reported separately. Electronically Signed   By: Odessa Fleming M.D.   On: 09/09/2023 05:42   CT ANGIO HEAD NECK W WO CM (CODE STROKE)  Result Date: 09/09/2023 CLINICAL DATA:  Code stroke. 77 year old female last known well 2100 hours. Abnormal speech and right side deficits. EXAM: CT ANGIOGRAPHY HEAD AND NECK WITH AND WITHOUT CONTRAST TECHNIQUE: Multidetector CT imaging of the head and neck was performed using the standard protocol during bolus administration of intravenous contrast. Multiplanar CT image reconstructions and MIPs were obtained to evaluate the vascular anatomy. Carotid stenosis measurements (when applicable) are obtained utilizing NASCET criteria, using the  distal internal carotid diameter as the denominator. RADIATION DOSE REDUCTION: This exam was performed according to the departmental dose-optimization program which includes automated exposure control, adjustment of the mA and/or kV according to patient size and/or use of iterative reconstruction technique. CONTRAST:  75mL OMNIPAQUE IOHEXOL 350 MG/ML SOLN COMPARISON:  Plain head CT 0436 hours today. Prior CTA neck 07/01/2020. Previous brain MRA 12/10/2011. And brain MRI last year 08/16/2022. FINDINGS: CTA NECK Skeleton: Chronic paranasal sinus disease. Chronic cervical spine degeneration. Facet arthropathy and C4-C5 facet ankylosis on the right side. Bilateral cervicothoracic facet ankylosis. No acute osseous abnormality identified. Upper chest: Negative. Other neck: No acute finding. Aortic arch: Calcified aortic atherosclerosis.  3 vessel arch. Right carotid system: No significant brachiocephalic artery or right CCA origin plaque or stenosis. Tortuous proximal right CCA. Bulky soft and calcified plaque along the medial vessel proximal to the bifurcation. Up to 50 % stenosis with respect to the distal vessel (series 7, image 113). Bulky calcified plaque at the right ICA origin and bulb with high-grade stenosis numerically estimated at 69 % with respect to the distal vessel (series 5, image 201). The vessel remains patent to the skull base. This does not appear significantly changed since 2021. Left carotid system: Left CCA origin plaque without stenosis. Patent stent beginning in the distal left CCA and continuing through the ICA, with substantially improved flow there compared to the 2021 CTA. No significant stenosis to the skull base. Vertebral arteries: Proximal right subclavian artery calcified plaque without stenosis. Normal right vertebral artery origin. Dominant appearing right vertebral artery is patent and mildly tortuous to the skull base without stenosis. Proximal left subclavian artery plaque and also  motion artifact. No high-grade proximal subclavian artery stenosis.  Left vertebral artery is non dominant and highly diminutive. There is chronic calcified plaque at its origin. Diminutive left vertebral artery appears stable since 2021, remains patent although diminutive to the skull base. CTA HEAD Posterior circulation: Dominant right vertebral V4 segment with normal PICA origins supplies the proximal basilar. Diminutive left vertebral artery functionally terminates in PICA as before. Proximal basilar artery is patent, but there is distal basilar stenosis or thrombosis along a segment of 3-4 mm as seen on series 10, image 26. This level was not included on the 2021 CTA, but was patent on the 2012 MR a. there is reconstitution of the right PCA origin, with diminutive or absent right posterior communicating artery on that side. There is a contralateral fetal type left PCA origin. Bilateral PCA branches remain patent with mild irregularity. SCA origins are not identified. Anterior circulation: Both ICA siphons are patent. Both ICA siphons are calcified. On the left there is mild to moderate siphon stenosis maximal in the supraclinoid segment. On the right there is also mild to moderate supraclinoid stenosis. Normal left posterior communicating artery origin. Patent carotid termini. Dominant appearing right ACA A1, similar to 2012 MRA. Normal anterior communicating artery. Patent MCA origins. Bilateral MCA and ACA branch detail is motion degraded. Both MCA bifurcations appear to remain patent. But MCA and ACA branch detail is otherwise limited. Delayed phase images were obtained (series 14), and seem to demonstrate fairly symmetric A2 and M2 enhancement. Venous sinuses: Patent on the delayed images. Anatomic variants: Chronically dominant right and diminutive left vertebral arteries. Left vertebral functionally terminates in PICA. Fetal type left PCA origin. Dominant right ACA A1. Review of the MIP images confirms the  above findings Preliminary report of this exam was discussed by telephone with Dr. Tollie Eth on 09/09/2023 at 04:50 . IMPRESSION: 1. 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 Nonvisualization of the distal 3-4 mm of the Basilar, new since 2012. Involvement of the right PCA origin, but right PCA is reconstituted. SCAs are not identified. Stat Brain MRI being obtained for further evaluation, see that report. This was discussed by telephone with Dr. Tollie Eth on 09/09/2023 at 04:50. 2. Motion degraded MCA and ACA branches with no other strong evidence of ELVO. 3. Substantially improved cervical Left ICA following stenting. High-grade contralateral proximal right ICA stenosis estimated at 69%, not significantly changed from 2021. Calcified ICA siphons with mild to moderate bilateral siphon stenosis. 4. Aortic Atherosclerosis (ICD10-I70.0). Cervical spine degeneration and facet ankylosis. Electronically Signed   By: Odessa Fleming M.D.   On: 09/09/2023 05:26   DG Chest Port 1 View  Result Date: 09/09/2023 CLINICAL DATA:  Code stroke. 77 year old female last known well 2100 hours. Abnormal speech and right side deficits. EXAM: PORTABLE CHEST 1 VIEW COMPARISON:  CTA neck today.  Portable chest 08/15/2022. FINDINGS: Portable AP semi upright view at 0418 hours. Lower lung volumes compared to last year. Platelike left lung base opacity. But elsewhere Allowing for portable technique the lungs are clear. Upper lungs appeared clear on CTA today. Normal cardiac size and mediastinal contours. Visualized tracheal air column is within normal limits. No acute osseous abnormality identified. Paucity of bowel gas. IMPRESSION: Lower lung volumes with basilar atelectasis, especially on the left. Electronically Signed   By: Odessa Fleming M.D.   On: 09/09/2023 04:52   CT Head Code Stroke WO Contrast  Result Date: 09/09/2023 CLINICAL DATA:  Code stroke. 77 year old female last known well 2100 hours. Abnormal speech  and right side deficits.  EXAM: CT HEAD WITHOUT CONTRAST TECHNIQUE: Contiguous axial images were obtained from the base of the skull through the vertex without intravenous contrast. RADIATION DOSE REDUCTION: This exam was performed according to the departmental dose-optimization program which includes automated exposure control, adjustment of the mA and/or kV according to patient size and/or use of iterative reconstruction technique. COMPARISON:  Brain MRI 08/16/2022.  Head CT 08/15/2022. FINDINGS: Brain: Stable cerebral volume, normal for age. Stable gray-white matter differentiation throughout the brain. No midline shift, ventriculomegaly, mass effect, evidence of mass lesion, intracranial hemorrhage or evidence of cortically based acute infarction. Vascular: Calcified atherosclerosis at the skull base. No suspicious intracranial vascular hyperdensity. Skull: Stable.  No acute osseous abnormality identified. 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 Chronic right ethmoidectomy. Tympanic cavities and mastoids remain clear. Other: No gaze deviation. Visualized scalp soft tissues are within normal limits. ASPECTS [facility] Stroke Program Early CT Score) Total score (0-10 with 10 being normal): 10 IMPRESSION: 1. Stable and negative for age noncontrast CT appearance of the brain. ASPECTS 10. 2. The above communicated to Dr. Derry Lory at 4:44 am on 09/09/2023 by text page via the [facility] messaging system. 3. Chronic paranasal sinus disease. Electronically Signed   By: Odessa Fleming M.D.   On: 09/09/2023 04:45    I independently reviewed the above imaging studies.  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 Recommend bladder scan and if >300 would recommend foley, otherwise continue treating UTI  I will sign off for now but please call with any further questions  Irine Seal 09/10/2023, 1:24 PM  [facility]  Pager:  [phone]

## 2023-09-10 NOTE — Progress Notes (Signed)
STROKE TEAM PROGRESS NOTE   BRIEF HPI Ms. Charlene SHIPPEE is a 77 y.o. female with history of diverticulitis, hypertension, hyperlipidemia, myalgias/myositis/fibromyalgia, carotid artery stenosis, right ICA stenosis status post stenting, former alcohol use, and an appendectomy 2 weeks ago presents to [facility] ED with nausea/vomiting and mild right sided numbness, greatest in right lower limb, as well as AMS requiring Versed for aggression.   SIGNIFICANT HOSPITAL EVENTS 9/27: Presentation.  CTA showed progression of basilar artery stenosis and posterior circulation changes.  Diagnostic cerebral catheter angiogram pending  INTERIM HISTORY/SUBJECTIVE Husband at the bedside, pt lying in bed, stated that her numbness has resolved. Still has some mild nausea but no vomiting. Had angiogram yesterday with Dr. Corliss Skains, will plan for BA stenting after infection controlled.   OBJECTIVE  CBC    Component Value Date/Time   WBC 13.9 (H) 09/09/2023 0539   RBC 3.49 (L) 09/09/2023 0539   HGB 10.5 (L) 09/09/2023 0543   HCT 31.0 (L) 09/09/2023 0543   PLT 356 09/09/2023 0539   MCV 94.6 09/09/2023 0539   MCH 28.1 09/09/2023 0539   MCHC 29.7 (L) 09/09/2023 0539   RDW 19.8 (H) 09/09/2023 0539   LYMPHSABS 1.4 09/09/2023 0539   MONOABS 0.9 09/09/2023 0539   EOSABS 0.0 09/09/2023 0539   BASOSABS 0.1 09/09/2023 0539    BMET    Component Value Date/Time   NA 135 09/09/2023 0543   K 4.6 09/09/2023 0543   CL 106 09/09/2023 0543   CO2 17 (L) 09/09/2023 0539   GLUCOSE 178 (H) 09/09/2023 0543   GLUCOSE 123 (H) 11/17/2006 1240   BUN 23 09/09/2023 0543   CREATININE 1.20 (H) 09/09/2023 0543   CREATININE 0.81 09/16/2014 1759   CALCIUM 8.5 (L) 09/09/2023 0539   GFRNONAA 49 (L) 09/09/2023 0539   GFRNONAA 76 09/16/2014 1759    IMAGING past 24 hours ECHOCARDIOGRAM COMPLETE BUBBLE STUDY  Result Date: 09/10/2023    ECHOCARDIOGRAM REPORT   Patient Name:   BOBBE QUILTER Loud Date of Exam: 09/10/2023 Medical  Rec #:  [id number]       Height:       62.0 in Accession #:    [id number]      Weight:       145.0 lb Date of Birth:  January 15, 1946       BSA:          1.667 m Patient Age:    77 years        BP:           114/49 mmHg Patient Gender: F               HR:           58 bpm. Exam Location:  Inpatient Procedure: 2D Echo, Color Doppler, Cardiac Doppler and Saline Contrast Bubble            Study Indications:    Stroke  History:        Patient has prior history of Echocardiogram examinations, most                 recent 11/11/2011. Stroke, TIA and PAD; Risk                 Factors:Hypertension, Diabetes and Dyslipidemia.  Sonographer:    Milbert Coulter Referring Phys: [id number] CORTNEY E DE LA TORRE IMPRESSIONS  1. Left ventricular ejection fraction, by estimation, is 60 to 65%. The left ventricle has normal function. The left ventricle has no regional  wall motion abnormalities. There is mild left ventricular hypertrophy. Left ventricular diastolic parameters are consistent with Grade I diastolic dysfunction (impaired relaxation).  2. Right ventricular systolic function is normal. The right ventricular size is normal. Tricuspid regurgitation signal is inadequate for assessing PA pressure.  3. Resolution is challenging for bubble study; possible small PFO with a few bubble within 3-5 cardiac cycles.  4. The mitral valve is normal in structure. No evidence of mitral valve regurgitation.  5. The aortic valve is normal in structure. Aortic valve regurgitation is not visualized.  6. The inferior vena cava is normal in size with greater than 50% respiratory variability, suggesting right atrial pressure of 3 mmHg. Conclusion(s)/Recommendation(s): Possible small PFO; considering his age and comorbidites stroke etiology is multifactorial. Would not w/u for PFO closure. FINDINGS  Left Ventricle: Left ventricular ejection fraction, by estimation, is 60 to 65%. The left ventricle has normal function. The left ventricle has no regional wall  motion abnormalities. The left ventricular internal cavity size was normal in size. There is  mild left ventricular hypertrophy. Left ventricular diastolic parameters are consistent with Grade I diastolic dysfunction (impaired relaxation). Right Ventricle: The right ventricular size is normal. Right ventricular systolic function is normal. Tricuspid regurgitation signal is inadequate for assessing PA pressure. Left Atrium: Left atrial size was normal in size. Right Atrium: Right atrial size was normal in size. Pericardium: There is no evidence of pericardial effusion. Mitral Valve: The mitral valve is normal in structure. No evidence of mitral valve regurgitation. Tricuspid Valve: Tricuspid valve regurgitation is not demonstrated. Aortic Valve: The aortic valve is normal in structure. Aortic valve regurgitation is not visualized. Aortic valve mean gradient measures 4.0 mmHg. Aortic valve peak gradient measures 7.6 mmHg. Aortic valve area, by VTI measures 2.14 cm. Pulmonic Valve: Pulmonic valve regurgitation is not visualized. Aorta: The aortic root is normal in size and structure. Venous: The inferior vena cava is normal in size with greater than 50% respiratory variability, suggesting right atrial pressure of 3 mmHg. IAS/Shunts: No atrial level shunt detected by color flow Doppler. Agitated saline contrast was given intravenously to evaluate for intracardiac shunting.  LEFT VENTRICLE PLAX 2D LVIDd:         4.20 cm   Diastology LVIDs:         2.90 cm   LV e' medial:    7.51 cm/s LV PW:         1.00 cm   LV E/e' medial:  6.8 LV IVS:        1.10 cm   LV e' lateral:   11.40 cm/s LVOT diam:     2.10 cm   LV E/e' lateral: 4.5 LV SV:         70 LV SV Index:   42 LVOT Area:     3.46 cm  RIGHT VENTRICLE RV Basal diam:  2.80 cm RV Mid diam:    2.40 cm RV S prime:     20.80 cm/s TAPSE (M-mode): 2.5 cm LEFT ATRIUM             Index        RIGHT ATRIUM           Index LA diam:        3.90 cm 2.34 cm/m   RA Area:     10.60  cm LA Vol (A2C):   35.8 ml 21.47 ml/m  RA Volume:   20.40 ml  12.23 ml/m LA Vol (A4C):   40.0 ml 23.99  ml/m LA Biplane Vol: 39.7 ml 23.81 ml/m  AORTIC VALVE AV Area (Vmax):    2.25 cm AV Area (Vmean):   2.26 cm AV Area (VTI):     2.14 cm AV Vmax:           138.00 cm/s AV Vmean:          91.400 cm/s AV VTI:            0.325 m AV Peak Grad:      7.6 mmHg AV Mean Grad:      4.0 mmHg LVOT Vmax:         89.60 cm/s LVOT Vmean:        59.700 cm/s LVOT VTI:          0.201 m LVOT/AV VTI ratio: 0.62  AORTA Ao Root diam: 3.20 cm MITRAL VALVE MV Area (PHT): 2.12 cm    SHUNTS MV Decel Time: 357 msec    Systemic VTI:  0.20 m MV E velocity: 50.80 cm/s  Systemic Diam: 2.10 cm MV A velocity: 84.40 cm/s MV E/A ratio:  0.60 Photographer signed by Carolan Clines Signature Date/Time: 09/10/2023/3:36:31 PM    Final    CT ABDOMEN PELVIS W CONTRAST  Result Date: 09/09/2023 CLINICAL DATA:  Abdomen pain EXAM: CT ABDOMEN AND PELVIS WITH CONTRAST TECHNIQUE: Multidetector CT imaging of the abdomen and pelvis was performed using the standard protocol following bolus administration of intravenous contrast. RADIATION DOSE REDUCTION: This exam was performed according to the departmental dose-optimization program which includes automated exposure control, adjustment of the mA and/or kV according to patient size and/or use of iterative reconstruction technique. CONTRAST:  75mL OMNIPAQUE IOHEXOL 350 MG/ML SOLN COMPARISON:  CT 09/09/2023, CT  angiography 08/08/2023, 08/07/2023 FINDINGS: Lower chest: Lung bases demonstrate no acute airspace disease. Hepatobiliary: Excreted contrast in the gallbladder. No biliary dilatation or focal hepatic abnormality Pancreas: Unremarkable. No pancreatic ductal dilatation or surrounding inflammatory changes. Spleen: Normal in size without focal abnormality. Adrenals/Urinary Tract: Adrenal glands are within normal limits. Cyst in the right kidney, no imaging follow-up is recommended. Excreted  contrast within the renal collecting systems and bladder. Interim development of diffuse bladder wall thickening, perivesical stranding and gas within the wall of the bladder. Stomach/Bowel: The stomach is nonenlarged. No dilated small bowel. Suspicion of mild wall thickening and inflammation at the ileocecal junction and proximal ascending colon, series 3 image 47 through 55. Diverticular disease of the left colon. Vascular/Lymphatic: Moderate aortic atherosclerosis. No aneurysm. No suspicious lymph nodes. Reproductive: Uterus unremarkable. Hyperdense material/contrast within the vagina. No adnexal mass Other: No free air or pelvic effusion. Small foci of gas along the bilateral pelvic sidewall, suspect that these are intravascular. Musculoskeletal: No acute or suspicious osseous abnormality. IMPRESSION: 1. Interim development of diffuse bladder wall thickening, perivesical stranding and gas within the wall of the bladder, consistent with emphysematous cystitis. 2. Suspicion of mild wall thickening and inflammation at the ileocecal junction and proximal ascending colon which may be due to distal ileitis/colitis from infection, inflammatory bowel disease, or ischemia. 3. Diverticular disease of the left colon without acute inflammatory change. 4. Hyperdense contrast within the vagina, no obvious fistula seen. Question related to vaginal reflux. Aortic Atherosclerosis (ICD10-I70.0) and Emphysema (ICD10-J43.9). These results will be called to the ordering clinician or representative by the Radiologist Assistant, and communication documented in the PACS or [employer]. Electronically Signed   By: Jasmine Pang M.D.   On: 09/09/2023 23:36    Vitals:   09/10/23 0410  09/10/23 0721 09/10/23 1114 09/10/23 1540  BP: (!) 109/46 (!) 114/49 (!) 142/49 (!) 114/48  Pulse: 67 73 72 63  Resp: 10 17 17 16   Temp: 98.2 F (36.8 C) 98.2 F (36.8 C) 98 F (36.7 C) 98.2 F (36.8 C)  TempSrc: Oral Oral Oral Oral  SpO2:  92% 94% 93% 97%  Weight:      Height:         PHYSICAL EXAM General:  Alert, well-nourished, well-developed [name] in no acute distress Psych:  Mood and affect appropriate for situation CV: Regular rate and rhythm on monitor Respiratory:  Regular, unlabored respirations on room air GI: Abdomen soft and nontender   NEURO:  Mental Status: AA&Ox3, patient is able to give clear and coherent history Speech/Language: speech is without dysarthria or aphasia.  Naming, repetition, fluency, and comprehension intact.  Cranial Nerves:  II: PERRL. Visual fields full.  III, IV, VI: EOMI. Eyelids elevate symmetrically.  V: Sensation is intact to light touch and symmetrical to face.  VII: Face is symmetrical resting and smiling VIII: hearing intact to voice. IX, X: Palate elevates symmetrically. Phonation is normal.  NW:GNFAOZHY shrug 5/5. XII: tongue is midline without fasciculations. Motor: 5 out of 5 strength to all muscle groups tested.  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 ASPECTS 10.  Chronic paranasal sinus disease CTA head & neck: Distal basilar artery occlusion/near occlusion.  Nonvisualization of distal 3-4 mm of basilar, new since 2012.  No LVO.  Left cervical ICA status post stent. Left ICA stent patent. Right ICA 70% stenosis, stable. B/l ICA siphon mild to moderate statnosis MRI: No evidence of acute ischemia.   MRA: Occlusion/poor flow of basilar artery tip and right PCA origin. Carotid Doppler: 40 to 59% stenosis in the right ICA, left ICA 1 to 39% stenosis. 2D Echo EF 60-65% LDL 117 HgbA1c 5.7 UDS + THC VTE prophylaxis -none Aspirin 81 mg and Plavix 75 mg at home, now on ASA and brilinta in preparation  for BA stenting.  Therapy recommendations: outpt PT Disposition: pending  Cystitis and colitis  Appendicitis s/p appendectomy   S/p appendectomy 2 weeks ago CT abd/pelvis - Interim development of diffuse bladder wall thickening, perivesical stranding and gas within the wall of the bladder, consistent with emphysematous cystitis. Suspicion of mild wall thickening and inflammation at the ileocecal junction and proximal ascending colon which may be due to distal ileitis/colitis from infection, inflammatory bowel disease, or ischemia. 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   MRA: Occlusion/poor flow of basilar artery tip and right PCA origin. Cerebral angiogram - Severe tapered stenosis of the distal basilar artery extending into the right PCA P1 segment. 50% stenosis of the proximal right ICA.  Plan for BA stenting once infection controlled.  Hypertension Home meds: Lisinopril 5 mg Stable BP goal 130-160 before BA revascularization Long term BP goal normotensive  Hyperlipidemia Home meds: None LDL 117, goal < 70 On crestor 20 Continue statin at discharge  Other Stroke Risk Factors Advanced age History of alcohol abuse Substance abuse, UDS positive for THC - cessation education provided Hx of left carotid stenting  Hospital day # 1  Neurology will sign off. Please call with questions. Pt  will follow up with stroke clinic NP at [facility] in about 4 weeks. Thanks for the consult.  Marvel Plan, MD PhD Stroke Neurology 09/10/2023 4:30 PM   To contact Stroke Continuity provider, please refer to [email or web address]. After hours, contact General Neurology

## 2023-09-10 NOTE — Progress Notes (Signed)
Pharmacy Antibiotic Note  [name] is a 77 y.o. female admitted on 09/09/2023 with TIA. Patient had recent appendectomy and now suspect  intra-abdominal infection .  Pharmacy has been consulted for zosyn dosing.  WBC 13.9, CRP 2, LA 1.5, PCT 0.4 Patient reporting abdominal pain  Plan: Zosyn 3.375g IV q8h (4 hour infusion). Continue to monitor CBC, cultures, renal function, and signs of clinical improvement for opportunity to narrow   Height: 5\' 2"  (157.5 cm) Weight: 65.8 kg (145 lb) IBW/kg (Calculated) : 50.1  Temp (24hrs), Avg:98.2 F (36.8 C), Min:97.8 F (36.6 C), Max:98.4 F (36.9 C)  Recent Labs  Lab 09/09/23 0539 09/09/23 0543 09/09/23 1907 09/09/23 2114  WBC 13.9*  --   --   --   CREATININE 1.17* 1.20*  --   --   LATICACIDVEN  --   --  0.8 1.5    Estimated Creatinine Clearance: 36.1 mL/min (A) (by C-G formula based on SCr of 1.2 mg/dL (H)).    Allergies  Allergen Reactions   Diflucan [Fluconazole] Nausea And Vomiting   Latex Hives   Lipitor [Atorvastatin Calcium] Other (See Comments)    Muscle aches   Codeine Rash    And nausea    Pentazocine Lactate Rash   Sulfonamide Derivatives Rash    Antimicrobials this admission: 9/28 zosyn >>   Microbiology results: 9/27 UCx: pending  9/27 MRSA PCR: positive  Thank you for allowing pharmacy to be a part of this patient's care.  Stephenie Acres, PharmD PGY1 Pharmacy Resident 09/10/2023 9:29 AM

## 2023-09-10 NOTE — Evaluation (Signed)
Physical Therapy Evaluation Patient Details Name: [name] MRN: [id number] DOB: 1946/10/09 Today's Date: 09/10/2023  History of Present Illness  77 y.o. female presents to [facility] ED 9/27 with nausea/vomiting and mild right sided numbness, greatest in right lower limb, as well as AMS. 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 Approximately 50% stenosis of the proximal right ICA.   With history of diverticulitis, hypertension, hyperlipidemia, myalgias/myositis/fibromyalgia, carotid artery stenosis, right ICA stenosis status post stenting, former alcohol use, and an appendectomy 2 weeks ago.  Clinical Impression  Pt admitted with above diagnosis. PTA pt independent but does not drive. States she is upset after receiving news from medical team this morning and has a big decision to make. Internally distracted and a bit agitated but agreeable to allow PT to assist her out of bed to recliner. Oriented x4 with delayed processing at times, possibly a component of HOH too. Pt was mod I with bed mobility, and CGA for sit to stand and step pivot transfer to recliner. Will need formal gait assessment when willing. Will attempt at next follow-up. Pt currently with functional limitations due to the deficits listed below (see PT Problem List). Pt will benefit from acute skilled PT to increase their independence and safety with mobility to allow discharge.           If plan is discharge home, recommend the following: A little help with walking and/or transfers;A little help with bathing/dressing/bathroom;Assistance with cooking/housework;Assist for transportation;Help with stairs or ramp for entrance   Can travel by private vehicle    yes    Equipment Recommendations Other (comment) (TBD, does not recall what equipment she already has - will need to confirm with husband after gait assessment.)  Recommendations for Other Services        Functional Status Assessment Patient has had a recent decline in their functional status and demonstrates the ability to make significant improvements in function in a reasonable and predictable amount of time.     Precautions / Restrictions Precautions Precautions: Fall Restrictions Weight Bearing Restrictions: No      Mobility  Bed Mobility Overal bed mobility: Needs Assistance, Modified Independent Bed Mobility: Supine to Sit     Supine to sit: Modified independent (Device/Increase time)     General bed mobility comments: extra time    Transfers Overall transfer level: Needs assistance Equipment used: None Transfers: Sit to/from Stand, Bed to chair/wheelchair/BSC Sit to Stand: Contact guard assist   Step pivot transfers: Contact guard assist       General transfer comment: CGA for safety, took small pivot steps to recliner, trunk mildly flexed, guarded. No buckling noted. Appears to have some gross weakness functionally.    Ambulation/Gait               General Gait Details: Agitated, she states from receiving medical report this morning, and wants to get into chair to eat breakfast. Deferred formal gait assessment for now.  Stairs            Wheelchair Mobility     Tilt Bed    Modified Rankin (Stroke Patients Only) Modified Rankin (Stroke Patients Only) Pre-Morbid Rankin Score: No symptoms Modified Rankin: Moderately severe disability     Balance Overall balance assessment: Needs assistance Sitting-balance support: No upper extremity supported, Feet supported Sitting balance-Leahy Scale: Good     Standing balance support: No upper extremity supported, During functional activity Standing balance-Leahy Scale: Fair Standing  balance comment: increased sway, reaching for furniture                             Pertinent Vitals/Pain Pain Assessment Pain Assessment: No/denies pain    Home Living Family/patient expects to be  discharged to:: Private residence Living Arrangements: Spouse/significant other Available Help at Discharge: Family;Available 24 hours/day Type of Home: House Home Access: Stairs to enter Entrance Stairs-Rails: Right Entrance Stairs-Number of Steps: 3-4   Home Layout: Two level;Full bath on main level;Able to live on main level with bedroom/bathroom Home Equipment:  (unsure)      Prior Function Prior Level of Function : Independent/Modified Independent             Mobility Comments: ind ADLs Comments: ind     Extremity/Trunk Assessment   Upper Extremity Assessment Upper Extremity Assessment: Right hand dominant;Defer to OT evaluation    Lower Extremity Assessment Lower Extremity Assessment: Difficult to assess due to impaired cognition (gross weakness with transfer. A bit agitated and deferred comprehensive MMT. ankle DF 5/5 bil.)       Communication   Communication Communication: Hearing impairment Cueing Techniques: Verbal cues  Cognition Arousal: Alert Behavior During Therapy: Agitated Overall Cognitive Status: Within Functional Limits for tasks assessed                                 General Comments: Delayed responses but possibly due to distraction from recent medical report and HOH.        General Comments      Exercises     Assessment/Plan    PT Assessment Patient needs continued PT services  PT Problem List Decreased strength;Decreased activity tolerance;Decreased balance;Decreased mobility;Decreased coordination;Decreased cognition;Decreased knowledge of use of DME;Decreased safety awareness;Decreased knowledge of precautions;Impaired sensation;Impaired tone       PT Treatment Interventions DME instruction;Gait training;Stair training;Functional mobility training;Therapeutic activities;Therapeutic exercise;Neuromuscular re-education;Balance training;Cognitive remediation;Patient/family education    PT Goals (Current goals can be  found in the Care Plan section)  Acute Rehab PT Goals Patient Stated Goal: none PT Goal Formulation: With patient Time For Goal Achievement: 09/23/23 Potential to Achieve Goals: Good    Frequency Min 1X/week     Co-evaluation               AM-PAC PT "6 Clicks" Mobility  Outcome Measure Help needed turning from your back to your side while in a flat bed without using bedrails?: None Help needed moving from lying on your back to sitting on the side of a flat bed without using bedrails?: None Help needed moving to and from a bed to a chair (including a wheelchair)?: A Little Help needed standing up from a chair using your arms (e.g., wheelchair or bedside chair)?: A Little Help needed to walk in hospital room?: A Little Help needed climbing 3-5 steps with a railing? : A Little 6 Click Score: 20    End of Session Equipment Utilized During Treatment: Gait belt Activity Tolerance: Patient tolerated treatment well Patient left: in chair;with call bell/phone within reach;with chair alarm set   PT Visit Diagnosis: Unsteadiness on feet (R26.81);Hemiplegia and hemiparesis;Difficulty in walking, not elsewhere classified (R26.2);Other symptoms and signs involving the nervous system (R29.898) Hemiplegia - Right/Left: Right Hemiplegia - caused by: Unspecified    Time: 5621-3086 PT Time Calculation (min) (ACUTE ONLY): 22 min   Charges:   PT Evaluation $PT Eval Low  Complexity: 1 Low   PT General Charges $$ ACUTE PT VISIT: 1 Visit         Kathlyn Sacramento, PT, DPT [facility]  Rehabilitation Services Physical Therapist Office: [phone] Website: [email or web address]   Berton Mount 09/10/2023, 10:32 AM

## 2023-09-10 NOTE — Progress Notes (Signed)
[facility]  PROGRESS NOTE  [name] GNF:[id number] DOB: 10-03-1946 DOA: 09/09/2023 PCP: Marguarite Arbour, MD   Brief HPI:   77 y.o. female with medical history significant of HTN, HLD, diverticulosis, DM2, ETOH abuse in remission, right ICA stenosis status post stenting, interim history of admission 8/25/-8/29 for lower gi bleed presumed diverticular with associated hemorrhagic shock which required icu admission while on DAPT.  Patient course further complicated by appendectomy 2 weeks. Patient presents  who further interim history of feeling poorly over the last few days with n/v/d and abdominal  pain. However on waking this am around 2 am with  a bout of n/v she noted weakness/numbness in right leg. Due to this EMS was called.    Assessment/Plan:   TIA of posterior circulation -As significant basilar artery stenosis -Started on aspirin and Brilinta -Plan for stenting of the artery once leukocytosis resolves  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 -Discussed with urology, treat with antibiotics, started on Zosyn -Follow urine culture results -Follow CBC in a.m.  Mild thickening and inflammation of ileocecal junction -Had recent appendectomy 2 weeks ago -Likely postop changes -Started on Zosyn as above -Denies right lower quadrant pain  Diabetes mellitus type 2 -Continue sliding scale insulin with NovoLog -CBG well-controlled  Recent history of GI bleed -Hemoglobin stable at 10.5 -Follow CBC in a.m.  Hypertension -Hold oral medications for permissive hypertension    Medications     Chlorhexidine Gluconate Cloth  6 each Topical Q0600   mupirocin ointment  1 Application Nasal BID     Data Reviewed:   CBG:  Recent Labs  Lab 09/09/23 2143 09/10/23 0604  GLUCAP 119* 115*    SpO2: 94 % O2 Flow Rate (L/min): 2 L/min    Vitals:   09/09/23 2037 09/09/23 2357 09/10/23 0410 09/10/23 0721  BP: (!) 116/41 (!) 113/41 (!) 109/46 (!) 114/49   Pulse: 67 70 67 73  Resp: 15 13 10 17   Temp: 97.8 F (36.6 C) 98.4 F (36.9 C) 98.2 F (36.8 C) 98.2 F (36.8 C)  TempSrc: Oral Oral Oral Oral  SpO2: 94% 96% 92% 94%  Weight:      Height:          Data Reviewed:  Basic Metabolic Panel: Recent Labs  Lab 09/09/23 0539 09/09/23 0543  NA 137 135  K 4.4 4.6  CL 102 106  CO2 17*  --   GLUCOSE 178* 178*  BUN 23 23  CREATININE 1.17* 1.20*  CALCIUM 8.5*  --     CBC: Recent Labs  Lab 09/09/23 0539 09/09/23 0543  WBC 13.9*  --   NEUTROABS 11.4*  --   HGB 9.8* 10.5*  HCT 33.0* 31.0*  MCV 94.6  --   PLT 356  --     LFT Recent Labs  Lab 09/09/23 0539  AST 20  ALT 14  ALKPHOS 63  BILITOT 0.3  PROT 5.4*  ALBUMIN 3.0*     Antibiotics: Anti-infectives (From admission, onward)    None        DVT prophylaxis: Lovenox  Code Status: Full code  Family Communication: No family at bedside   CONSULTS neurology   Subjective   Denies abdominal pain   Objective    Physical Examination:   General-appears in no acute distress Heart-S1-S2, regular, no murmur auscultated Lungs-clear to auscultation bilaterally, no wheezing or crackles auscultated Abdomen-soft, mild tenderness in suprapubic region Extremities-no edema in the lower extremities Neuro-alert, oriented x3, no focal deficit noted  Status is: Inpatient:             [name]   [facility] If 7PM-7AM, please contact night-coverage at [email or web address], Office  [phone]   09/10/2023, 8:28 AM  LOS: 1 day

## 2023-09-11 DIAGNOSIS — G459 Transient cerebral ischemic attack, unspecified: Secondary | ICD-10-CM | POA: Diagnosis not present

## 2023-09-11 DIAGNOSIS — I651 Occlusion and stenosis of basilar artery: Secondary | ICD-10-CM

## 2023-09-11 LAB — CBC
HCT: 29.6 % — ABNORMAL LOW (ref 36.0–46.0)
Hemoglobin: 9.3 g/dL — ABNORMAL LOW (ref 12.0–15.0)
MCH: 27.8 pg (ref 26.0–34.0)
MCHC: 31.4 g/dL (ref 30.0–36.0)
MCV: 88.6 fL (ref 80.0–100.0)
Platelets: 285 10*3/uL (ref 150–400)
RBC: 3.34 MIL/uL — ABNORMAL LOW (ref 3.87–5.11)
RDW: 18.6 % — ABNORMAL HIGH (ref 11.5–15.5)
WBC: 5.6 10*3/uL (ref 4.0–10.5)
nRBC: 0 % (ref 0.0–0.2)

## 2023-09-11 LAB — URINE CULTURE

## 2023-09-11 LAB — GLUCOSE, CAPILLARY
Glucose-Capillary: 128 mg/dL — ABNORMAL HIGH (ref 70–99)
Glucose-Capillary: 142 mg/dL — ABNORMAL HIGH (ref 70–99)
Glucose-Capillary: 132 mg/dL — ABNORMAL HIGH (ref 70–99)

## 2023-09-11 LAB — COMPREHENSIVE METABOLIC PANEL
ALT: 13 U/L (ref 0–44)
AST: 17 U/L (ref 15–41)
Albumin: 2.6 g/dL — ABNORMAL LOW (ref 3.5–5.0)
Alkaline Phosphatase: 68 U/L (ref 38–126)
Anion gap: 18 — ABNORMAL HIGH (ref 5–15)
BUN: 8 mg/dL (ref 8–23)
CO2: 22 mmol/L (ref 22–32)
Calcium: 8.7 mg/dL — ABNORMAL LOW (ref 8.9–10.3)
Chloride: 97 mmol/L — ABNORMAL LOW (ref 98–111)
Creatinine, Ser: 0.86 mg/dL (ref 0.44–1.00)
GFR, Estimated: >60 mL/min (ref >60)
Glucose, Bld: 126 mg/dL — ABNORMAL HIGH (ref 70–99)
Potassium: 3.9 mmol/L (ref 3.5–5.1)
Sodium: 137 mmol/L (ref 135–145)
Total Bilirubin: 0.5 mg/dL (ref 0.3–1.2)
Total Protein: 5.4 g/dL — ABNORMAL LOW (ref 6.5–8.1)

## 2023-09-11 MED ORDER — LORAZEPAM 0.5 MG PO TABS
0.5000 mg | ORAL_TABLET | Freq: Once | ORAL | Status: AC | PRN
  Administered 2023-09-11: 0.5 mg via ORAL
  Filled 2023-09-11: qty 1

## 2023-09-11 MED ORDER — NEBIVOLOL HCL 5 MG PO TABS
5.0000 mg | ORAL_TABLET | Freq: Every day | ORAL | Status: DC
  Administered 2023-09-12 – 2023-09-20 (×8): 5 mg via ORAL
  Filled 2023-09-11 (×10): qty 1

## 2023-09-11 MED ORDER — LORAZEPAM 2 MG/ML IJ SOLN: 0.5000 mg | Freq: Once | INTRAMUSCULAR | Status: DC | PRN

## 2023-09-11 MED ORDER — ALPRAZOLAM 0.5 MG PO TABS
0.5000 mg | ORAL_TABLET | Freq: Once | ORAL | Status: AC
  Administered 2023-09-11: 0.5 mg via ORAL
  Filled 2023-09-11: qty 1

## 2023-09-11 NOTE — Progress Notes (Signed)
[facility]  PROGRESS NOTE  [name] HYQ:[id number] DOB: [birth year] DOA: 09/09/2023 PCP: Marguarite Arbour, MD   Brief HPI:   77 y.o. female with medical history significant of HTN, HLD, diverticulosis, DM2, ETOH abuse in remission, right ICA stenosis status post stenting, interim history of admission 8/25/-8/29 for lower gi bleed presumed diverticular with associated hemorrhagic shock which required icu admission while on DAPT.  Patient course further complicated by appendectomy 2 weeks. Patient presents  who further interim history of feeling poorly over the last few days with n/v/d and abdominal  pain. However on waking this am around 2 am with  a bout of n/v she noted weakness/numbness in right leg. Due to this EMS was called.    Assessment/Plan:   TIA of posterior circulation -As significant basilar artery stenosis -Started on aspirin and Brilinta -Plan for stenting of the artery once leukocytosis resolves  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 -Discussed with urology, treat with antibiotics, started on Zosyn -Follow urine culture results -Follow CBC in a.m.  Mild thickening and inflammation of ileocecal junction -Had recent appendectomy 2 weeks ago -Likely postop changes -Started on Zosyn as above -Denies right lower quadrant pain  Diabetes mellitus type 2 -Continue sliding scale insulin with NovoLog -CBG well-controlled  Recent history of GI bleed -Hemoglobin stable at 10.5 -Follow CBC in a.m.  Hypertension -Hold oral medications for permissive hypertension  Anxiety - Give one dose of Xanax 0.5 mg po x1  Medications     aspirin  81 mg Oral Daily   enoxaparin (LOVENOX) injection  40 mg Subcutaneous Q24H   insulin aspart  0-9 Units Subcutaneous TID WC   mupirocin ointment  1 Application Nasal BID   nebivolol  5 mg Oral Daily   rosuvastatin  20 mg Oral Daily   ticagrelor  90 mg Oral BID     Data Reviewed:   CBG:  Recent Labs  Lab  09/10/23 1119 09/10/23 1541 09/10/23 2104 09/11/23 0609 09/11/23 1020  GLUCAP 126* 108* 108* 128* 142*    SpO2: 98 % O2 Flow Rate (L/min): 2 L/min    Vitals:   09/11/23 0811 09/11/23 0826 09/11/23 0900 09/11/23 1019  BP: (!) 187/71 (!) 176/69 132/79 (!) 150/72  Pulse: 91   100  Resp: 19   17  Temp: 98.3 F (36.8 C)   97.7 F (36.5 C)  TempSrc: Oral   Oral  SpO2: 94%   98%  Weight:      Height:          Data Reviewed:  Basic Metabolic Panel: Recent Labs  Lab 09/09/23 0539 09/09/23 0543  NA 137 135  K 4.4 4.6  CL 102 106  CO2 17*  --   GLUCOSE 178* 178*  BUN 23 23  CREATININE 1.17* 1.20*  CALCIUM 8.5*  --     CBC: Recent Labs  Lab 09/09/23 0539 09/09/23 0543  WBC 13.9*  --   NEUTROABS 11.4*  --   HGB 9.8* 10.5*  HCT 33.0* 31.0*  MCV 94.6  --   PLT 356  --     LFT Recent Labs  Lab 09/09/23 0539  AST 20  ALT 14  ALKPHOS 63  BILITOT 0.3  PROT 5.4*  ALBUMIN 3.0*     Antibiotics: Anti-infectives (From admission, onward)    Start     Dose/Rate Route Frequency Ordered Stop   09/10/23 1030  piperacillin-tazobactam (ZOSYN) IVPB 3.375 g        3.375 g  12.5 mL/hr over 240 Minutes Intravenous Every 8 hours 09/10/23 0943          DVT prophylaxis: Lovenox  Code Status: Full code  Family Communication: No family at bedside   CONSULTS neurology   Subjective   Patient was anxious this morning, felt numbness in feet transiently which resolved.  Moving all extremities.  Sensations intact.  Objective    Physical Examination:   Alert, appears anxious this morning S1-S2, regular, no murmur auscultated Lungs clear to auscultation bilaterally Extremities no edema Abdomen is soft, nontender, no organomegaly, no rigidity or guarding   Status is: Inpatient:             [name]   [facility] If 7PM-7AM, please contact night-coverage at [email or web address], Office  [phone]   09/11/2023, 11:28 AM  LOS: 2 days

## 2023-09-11 NOTE — Plan of Care (Signed)
  Problem: Education: Goal: Knowledge of General Education information will improve Description: Including pain rating scale, medication(s)/side effects and non-pharmacologic comfort measures Outcome: Progressing   Problem: Activity: Goal: Risk for activity intolerance will decrease Outcome: Progressing   Problem: Coping: Goal: Level of anxiety will decrease Outcome: Progressing   Problem: Elimination: Goal: Will not experience complications related to urinary retention Outcome: Progressing   Problem: Pain Managment: Goal: General experience of comfort will improve Outcome: Progressing   Problem: Skin Integrity: Goal: Risk for impaired skin integrity will decrease Outcome: Progressing

## 2023-09-11 NOTE — Plan of Care (Signed)

## 2023-09-12 DIAGNOSIS — I651 Occlusion and stenosis of basilar artery: Secondary | ICD-10-CM | POA: Diagnosis not present

## 2023-09-12 DIAGNOSIS — G459 Transient cerebral ischemic attack, unspecified: Secondary | ICD-10-CM | POA: Diagnosis not present

## 2023-09-12 LAB — GLUCOSE, CAPILLARY
Glucose-Capillary: 127 mg/dL — ABNORMAL HIGH (ref 70–99)
Glucose-Capillary: 101 mg/dL — ABNORMAL HIGH (ref 70–99)
Glucose-Capillary: 90 mg/dL (ref 70–99)
Glucose-Capillary: 109 mg/dL — ABNORMAL HIGH (ref 70–99)

## 2023-09-12 LAB — CBC
HCT: 32.2 % — ABNORMAL LOW (ref 36.0–46.0)
Hemoglobin: 10.2 g/dL — ABNORMAL LOW (ref 12.0–15.0)
MCH: 27.3 pg (ref 26.0–34.0)
MCHC: 31.7 g/dL (ref 30.0–36.0)
MCV: 86.1 fL (ref 80.0–100.0)
Platelets: 309 10*3/uL (ref 150–400)
RBC: 3.74 MIL/uL — ABNORMAL LOW (ref 3.87–5.11)
RDW: 18.5 % — ABNORMAL HIGH (ref 11.5–15.5)
WBC: 5 10*3/uL (ref 4.0–10.5)
nRBC: 0 % (ref 0.0–0.2)

## 2023-09-12 LAB — COMPREHENSIVE METABOLIC PANEL
ALT: 18 U/L (ref 0–44)
AST: 17 U/L (ref 15–41)
Albumin: 2.8 g/dL — ABNORMAL LOW (ref 3.5–5.0)
Alkaline Phosphatase: 71 U/L (ref 38–126)
Anion gap: 17 — ABNORMAL HIGH (ref 5–15)
BUN: 8 mg/dL (ref 8–23)
CO2: 21 mmol/L — ABNORMAL LOW (ref 22–32)
Calcium: 9 mg/dL (ref 8.9–10.3)
Chloride: 101 mmol/L (ref 98–111)
Creatinine, Ser: 0.81 mg/dL (ref 0.44–1.00)
GFR, Estimated: >60 mL/min (ref >60)
Glucose, Bld: 131 mg/dL — ABNORMAL HIGH (ref 70–99)
Potassium: 3.4 mmol/L — ABNORMAL LOW (ref 3.5–5.1)
Sodium: 139 mmol/L (ref 135–145)
Total Bilirubin: 0.8 mg/dL (ref 0.3–1.2)
Total Protein: 5.9 g/dL — ABNORMAL LOW (ref 6.5–8.1)

## 2023-09-12 MED ORDER — HYDRALAZINE HCL 20 MG/ML IJ SOLN
5.0000 mg | Freq: Four times a day (QID) | INTRAMUSCULAR | Status: DC | PRN
  Administered 2023-09-18: 5 mg via INTRAVENOUS
  Filled 2023-09-12: qty 1

## 2023-09-12 MED ORDER — POTASSIUM CHLORIDE 20 MEQ PO PACK
40.0000 meq | PACK | Freq: Once | ORAL | Status: AC
  Administered 2023-09-12: 40 meq via ORAL
  Filled 2023-09-12: qty 2

## 2023-09-12 MED ORDER — ALPRAZOLAM 0.5 MG PO TABS
0.5000 mg | ORAL_TABLET | Freq: Three times a day (TID) | ORAL | Status: DC | PRN
  Administered 2023-09-12 – 2023-09-14 (×5): 0.5 mg via ORAL
  Filled 2023-09-12 (×6): qty 1

## 2023-09-12 MED ORDER — LISINOPRIL 2.5 MG PO TABS
5.0000 mg | ORAL_TABLET | Freq: Every day | ORAL | Status: DC
  Administered 2023-09-12 – 2023-09-13 (×2): 5 mg via ORAL
  Filled 2023-09-12 (×2): qty 2

## 2023-09-12 MED ORDER — LOPERAMIDE HCL 2 MG PO CAPS
2.0000 mg | ORAL_CAPSULE | ORAL | Status: DC | PRN
  Administered 2023-09-12 – 2023-09-14 (×3): 2 mg via ORAL
  Filled 2023-09-12 (×3): qty 1

## 2023-09-12 NOTE — Consult Note (Signed)
Chief Complaint: Patient was seen in consultation today for basilar artery stenosis.  Referring Physician(s): Delia Heady, MD  Supervising Physician: Julieanne Cotton  Patient Status: [name] - In-pt  History of Present Illness: [name] is a 77 y.o. female with a past medical history significant for ETOH abuse (last use 8 years ago per patient), tobacco use, depression, fibromyalgia, DM, HTN, HLD, left ICA stent placement by cardiology 08/07/20 who presented to [facility] ED with sudden onset nausea, vomiting and lower extremity weakness.  Per chart review [name] was vacationing in [place] about 3 weeks ago when she developed acute appendicitis and required emergent laparoscopic appendectomy. She returned home about a week later and her husband became ill with COVID, she was asymptomatic but tested positive and so she took prophylactic Paxlovid. She was improving over the next week or so when she suddenly developed nausea, vomiting and lower extremity weakness. She underwent CTA head/neck which showed:  1. 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 Nonvisualization of the distal 3-4 mm of the Basilar, new since 2012. Involvement of the right PCA origin, but right PCA is reconstituted. SCAs are not identified. Stat Brain MRI being obtained for further evaluation, see that report. This was discussed by telephone with Dr. Tollie Eth on 09/09/2023 at 04:50.   2. Motion degraded MCA and ACA branches with no other strong evidence of ELVO.   3. Substantially improved cervical Left ICA following stenting. High-grade contralateral proximal right ICA stenosis estimated at 69%, not significantly changed from 2021. Calcified ICA siphons with mild to moderate bilateral siphon stenosis.   4. Aortic Atherosclerosis (ICD10-I70.0). Cervical spine degeneration and facet ankylosis.  Follow up MRA showed:  1. Confirmed Poor flow/Occlusion of the Basilar Artery tip and  Right PCA origin, concordant with CTA today. This is new since 2012, and SCA origins also no longer visible by MRA or CTA. See MRI at the same time reported separately.   2. Motion degraded exam. Other evidence of intracranial atherosclerosis, but no other large vessel occlusion identified.  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   Patient seen at bedside with her husband and Dr. Corliss Skains to discuss potential basilar artery angioplasty/stent placement with general anesthesia. She denies any complaints at this time, is a former [employer] fund[employer] and is familiar with recent events. Per her husband she has been intermittently slightly confused and was unable to accurately relay the angiogram findings to him yesterday. [name] states she was compliant with her Plavix as well as all of her other medications, she last drank ETOH 8 years ago, continues to smoke, has DM but is well controlled, does not exercise much, able to complete all ADLs prior to admission.   Dr. Corliss Skains reviewed the angiogram findings with patient and her husband, visual aids used to ensure understanding. Also reviewed that MRA did not show acute stroke. We reviewed potential options for management of the severe basilar artery stenosis including cerebral angiogram with possible angioplasty/possible stent placement vs medication management with Brilinta + ASA 81 mg every day. We reviewed that there is a 3-5% complication rate with this procedure which includes, but is not limited to, paraplegia, bilateral blindness, stroke, death. We  also discussed the possibility of being unable to traverse the plaque which would mean that the  procedure would have to be aborted. We reviewed another option is medication management which would include discontinuing Plavix in favor of Brilinta and starting ASA 81 mg every day, risks associated with this plan of action would include stroke, death.  Past Medical History:  Diagnosis Date   Alcohol abuse    CAP (community acquired pneumonia) 05/05/2018   Carotid artery stenosis    Depression    Diabetes mellitus    Diverticulitis    Essential hypertension 08/20/2007   Qualifier: Diagnosis of   By: Jonny Ruiz MD, Len Blalock        Fibromyalgia    Hepatitis 08/28/2014   Hyperlipidemia 08/20/2007   Qualifier: Diagnosis of   By: Jonny Ruiz MD, Len Blalock        Hypertension    Myalgia and myositis 03/28/2012    Past Surgical History:  Procedure Laterality Date   BUNIONECTOMY     CAROTID PTA/STENT INTERVENTION Left 08/07/2020   Procedure: CAROTID PTA/STENT INTERVENTION;  Surgeon: Annice Needy, MD;  Location: [facility]IVE [facility];  Service: Cardiovascular;  Laterality: Left;   CESAREAN SECTION     COLONOSCOPY     COLONOSCOPY WITH ESOPHAGOGASTRODUODENOSCOPY (EGD)     COLONOSCOPY WITH PROPOFOL N/A 02/18/2021   Procedure: COLONOSCOPY WITH PROPOFOL;  Surgeon: Toledo, Boykin Nearing, MD;  Location: [facility];  Service: Gastroenterology;  Laterality: N/A;   ESOPHAGOGASTRODUODENOSCOPY (EGD) WITH PROPOFOL N/A 02/18/2021   Procedure: ESOPHAGOGASTRODUODENOSCOPY (EGD) WITH PROPOFOL;  Surgeon: Toledo, Boykin Nearing, MD;  Location: [facility];  Service: Gastroenterology;  Laterality: N/A;   TONSILLECTOMY      Allergies: Diflucan [fluconazole], Latex, Lipitor [atorvastatin calcium], Codeine, Pentazocine lactate, and Sulfonamide derivatives  Medications: Prior to Admission medications   Medication Sig Start Date End Date Taking? Authorizing Provider  amphetamine-dextroamphetamine (ADDERALL XR) 20 MG 24 hr capsule Take 40 mg by mouth every morning. 06/29/23  Yes [provider]  aspirin EC 81 MG EC tablet  Take 1 tablet (81 mg total) by mouth daily. Swallow whole. 08/08/20  Yes Stegmayer, Cala Bradford A, PA-C  B Complex Vitamins (B COMPLEX-B12) TABS Take 1 tablet by mouth at bedtime.   Yes [provider]  BYSTOLIC 5 MG tablet TAKE 1 TABLET DAILY Patient taking differently: Take 5 mg by mouth daily. 09/26/15  Yes Carlus Pavlov, MD  clopidogrel (PLAVIX) 75 MG tablet TAKE 1 TABLET BY MOUTH EVERY DAY Patient taking differently: Take 75 mg by mouth daily. 07/18/23  Yes Georgiana Spinner, NP  diphenoxylate-atropine (LOMOTIL) 2.5-0.025 MG tablet Take 1 tablet by mouth 4 (four) times daily as needed for diarrhea or loose stools.  07/08/10  Yes [provider]  Dulaglutide (TRULICITY) 3 MG/0.5ML SOPN Inject 3 mg into the skin once a week. 06/23/23  Yes   empagliflozin (JARDIANCE) 25 MG TABS tablet Take 25 mg by mouth daily.   Yes [provider]  EPINEPHrine 0.3 mg/0.3 mL IJ SOAJ injection Inject 0.3 mg into the muscle as needed for anaphylaxis.   Yes [provider]  lisinopril (PRINIVIL,ZESTRIL) 5 MG tablet TAKE 1 TABLET BY MOUTH EVERY DAY Patient taking differently: Take 5 mg by mouth daily. 07/14/15  Yes Carlus Pavlov, MD  metFORMIN (GLUCOPHAGE) 1000 MG tablet Take 1,000 mg by mouth every evening. 10/19/21  Yes [provider]  omeprazole (PRILOSEC) 40 MG capsule Take 40 mg by mouth daily. 08/30/18  Yes [provider]  pregabalin (LYRICA) 300  MG capsule Take 300 mg by mouth 2 (two) times daily. 08/05/20  Yes [provider]  glucose blood (BAYER CONTOUR NEXT TEST) test strip Use to test blood sugar 3 times daily as instructed. Dx code: E11.65 02/10/15   Carlus Pavlov, MD  Lancets (ACCU-CHEK MULTICLIX) lancets Use to test blood sugar 3 times daily as instructed. Dx: E11.65 02/10/15   Carlus Pavlov, MD     Family History  Problem Relation Age of Onset   Cancer Mother    Heart disease Father    Heart disease Sister     Social History    Socioeconomic History   Marital status: Married    Spouse name: Not on file   Number of children: Not on file   Years of education: Not on file   Highest education level: Not on file  Occupational History   Not on file  Tobacco Use   Smoking status: Every Day    Current packs/day: 0.00    Types: Cigarettes, E-cigarettes    Last attempt to quit: 03/11/2010    Years since quitting: 13.5   Smokeless tobacco: Never  Vaping Use   Vaping status: Former  Substance and Sexual Activity   Alcohol use: Not Currently   Drug use: No    Comment: Pt denies    Sexual activity: Never  Other Topics Concern   Not on file  Social History Narrative   Not on file   Social Determinants of Health   Financial Resource Strain: Not on file  Food Insecurity: No Food Insecurity (09/09/2023)   Hunger Vital Sign    Worried About Running Out of Food in the Last Year: Never true    Ran Out of Food in the Last Year: Never true  Transportation Needs: No Transportation Needs (09/09/2023)   PRAPARE - Administrator, Civil Service (Medical): No    Lack of Transportation (Non-Medical): No  Physical Activity: Not on file  Stress: Not on file  Social Connections: Not on file     Review of Systems: A 12 point ROS discussed and pertinent positives are indicated in the HPI above.  All other systems are negative.  Review of Systems  Constitutional:  Negative for chills and fever.  Respiratory:  Negative for cough.   Cardiovascular:  Negative for chest pain.  Gastrointestinal:  Negative for abdominal pain.  Neurological:  Negative for dizziness, facial asymmetry, numbness and headaches.    Vital Signs: BP (!) 138/59 (BP Location: Right Arm)   Pulse 69   Temp 98.4 F (36.9 C) (Oral)   Resp 18   Ht 5\' 2"  (1.575 m)   Wt 145 lb (65.8 kg)   SpO2 95%   BMI 26.52 kg/m   Physical Exam Vitals and nursing note reviewed.  Constitutional:      General: She is not in acute distress. HENT:      Head: Normocephalic.  Cardiovascular:     Rate and Rhythm: Normal rate.  Skin:    General: Skin is warm and dry.  Neurological:     Mental Status: She is alert.      MD Evaluation Airway: WNL Heart: WNL Abdomen: WNL Chest/ Lungs: WNL ASA  Classification: 3 Mallampati/Airway Score: Two   Imaging: ECHOCARDIOGRAM COMPLETE BUBBLE STUDY  Result Date: 09/10/2023    ECHOCARDIOGRAM REPORT   Patient Name:   [name] Date of Exam: 09/10/2023 Medical Rec #:  [id number]       Height:  62.0 in Accession #:    [id number]      Weight:       145.0 lb Date of Birth:  08/19/1946       BSA:          1.667 m Patient Age:    77 years        BP:           114/49 mmHg Patient Gender: F               HR:           58 bpm. Exam Location:  Inpatient Procedure: 2D Echo, Color Doppler, Cardiac Doppler and Saline Contrast Bubble            Study Indications:    Stroke  History:        Patient has prior history of Echocardiogram examinations, most                 recent 11/11/2011. Stroke, TIA and PAD; Risk                 Factors:Hypertension, Diabetes and Dyslipidemia.  Sonographer:    Milbert Coulter Referring Phys: [id number] CORTNEY E DE LA [place]OR[place] IMPRESSIONS  1. Left ventricular ejection fraction, by estimation, is 60 to 65%. The left ventricle has normal function. The left ventricle has no regional wall motion abnormalities. There is mild left ventricular hypertrophy. Left ventricular diastolic parameters are consistent with Grade I diastolic dysfunction (impaired relaxation).  2. Right ventricular systolic function is normal. The right ventricular size is normal. Tricuspid regurgitation signal is inadequate for assessing PA pressure.  3. Resolution is challenging for bubble study; possible small PFO with a few bubble within 3-5 cardiac cycles.  4. The mitral valve is normal in structure. No evidence of mitral valve regurgitation.  5. The aortic valve is normal in structure. Aortic valve regurgitation is not  visualized.  6. The inferior vena cava is normal in size with greater than 50% respiratory variability, suggesting right atrial pressure of 3 mmHg. Conclusion(s)/Recommendation(s): Possible small PFO; considering his age and comorbidites stroke etiology is multifactorial. Would not w/u for PFO closure. FINDINGS  Left Ventricle: Left ventricular ejection fraction, by estimation, is 60 to 65%. The left ventricle has normal function. The left ventricle has no regional wall motion abnormalities. The left ventricular internal cavity size was normal in size. There is  mild left ventricular hypertrophy. Left ventricular diastolic parameters are consistent with Grade I diastolic dysfunction (impaired relaxation). Right Ventricle: The right ventricular size is normal. Right ventricular systolic function is normal. Tricuspid regurgitation signal is inadequate for assessing PA pressure. Left Atrium: Left atrial size was normal in size. Right Atrium: Right atrial size was normal in size. Pericardium: There is no evidence of pericardial effusion. Mitral Valve: The mitral valve is normal in structure. No evidence of mitral valve regurgitation. Tricuspid Valve: Tricuspid valve regurgitation is not demonstrated. Aortic Valve: The aortic valve is normal in structure. Aortic valve regurgitation is not visualized. Aortic valve mean gradient measures 4.0 mmHg. Aortic valve peak gradient measures 7.6 mmHg. Aortic valve area, by VTI measures 2.14 cm. Pulmonic Valve: Pulmonic valve regurgitation is not visualized. Aorta: The aortic root is normal in size and structure. Venous: The inferior vena cava is normal in size with greater than 50% respiratory variability, suggesting right atrial pressure of 3 mmHg. IAS/Shunts: No atrial level shunt detected by color flow Doppler. Agitated saline contrast was given intravenously to evaluate for intracardiac shunting.  LEFT VENTRICLE PLAX 2D LVIDd:         4.20 cm   Diastology LVIDs:         2.90  cm   LV e' medial:    7.51 cm/s LV PW:         1.00 cm   LV E/e' medial:  6.8 LV IVS:        1.10 cm   LV e' lateral:   11.40 cm/s LVOT diam:     2.10 cm   LV E/e' lateral: 4.5 LV SV:         70 LV SV Index:   42 LVOT Area:     3.46 cm  RIGHT VENTRICLE RV Basal diam:  2.80 cm RV Mid diam:    2.40 cm RV S prime:     20.80 cm/s TAPSE (M-mode): 2.5 cm LEFT ATRIUM             Index        RIGHT ATRIUM           Index LA diam:        3.90 cm 2.34 cm/m   RA Area:     10.60 cm LA Vol (A2C):   35.8 ml 21.47 ml/m  RA Volume:   20.40 ml  12.23 ml/m LA Vol (A4C):   40.0 ml 23.99 ml/m LA Biplane Vol: 39.7 ml 23.81 ml/m  AORTIC VALVE AV Area (Vmax):    2.25 cm AV Area (Vmean):   2.26 cm AV Area (VTI):     2.14 cm AV Vmax:           138.00 cm/s AV Vmean:          91.400 cm/s AV VTI:            0.325 m AV Peak Grad:      7.6 mmHg AV Mean Grad:      4.0 mmHg LVOT Vmax:         89.60 cm/s LVOT Vmean:        59.700 cm/s LVOT VTI:          0.201 m LVOT/AV VTI ratio: 0.62  AORTA Ao Root diam: 3.20 cm MITRAL VALVE MV Area (PHT): 2.12 cm    SHUNTS MV Decel Time: 357 msec    Systemic VTI:  0.20 m MV E velocity: 50.80 cm/s  Systemic Diam: 2.10 cm MV A velocity: 84.40 cm/s MV E/A ratio:  0.60 Photographer signed by Carolan Clines Signature Date/Time: 09/10/2023/3:36:31 PM    Final    CT ABDOMEN PELVIS W CONTRAST  Result Date: 09/09/2023 CLINICAL DATA:  Abdomen pain EXAM: CT ABDOMEN AND PELVIS WITH CONTRAST TECHNIQUE: Multidetector CT imaging of the abdomen and pelvis was performed using the standard protocol following bolus administration of intravenous contrast. RADIATION DOSE REDUCTION: This exam was performed according to the departmental dose-optimization program which includes automated exposure control, adjustment of the mA and/or kV according to patient size and/or use of iterative reconstruction technique. CONTRAST:  75mL OMNIPAQUE IOHEXOL 350 MG/ML SOLN COMPARISON:  CT 09/09/2023, CT  angiography  08/08/2023, 08/07/2023 FINDINGS: Lower chest: Lung bases demonstrate no acute airspace disease. Hepatobiliary: Excreted contrast in the gallbladder. No biliary dilatation or focal hepatic abnormality Pancreas: Unremarkable. No pancreatic ductal dilatation or surrounding inflammatory changes. Spleen: Normal in size without focal abnormality. Adrenals/Urinary Tract: Adrenal glands are within normal limits. Cyst in the right kidney, no imaging follow-up is recommended. Excreted contrast within the renal collecting systems and bladder.  Interim development of diffuse bladder wall thickening, perivesical stranding and gas within the wall of the bladder. Stomach/Bowel: The stomach is nonenlarged. No dilated small bowel. Suspicion of mild wall thickening and inflammation at the ileocecal junction and proximal ascending colon, series 3 image 47 through 55. Diverticular disease of the left colon. Vascular/Lymphatic: Moderate aortic atherosclerosis. No aneurysm. No suspicious lymph nodes. Reproductive: Uterus unremarkable. Hyperdense material/contrast within the vagina. No adnexal mass Other: No free air or pelvic effusion. Small foci of gas along the bilateral pelvic sidewall, suspect that these are intravascular. Musculoskeletal: No acute or suspicious osseous abnormality. IMPRESSION: 1. Interim development of diffuse bladder wall thickening, perivesical stranding and gas within the wall of the bladder, consistent with emphysematous cystitis. 2. Suspicion of mild wall thickening and inflammation at the ileocecal junction and proximal ascending colon which may be due to distal ileitis/colitis from infection, inflammatory bowel disease, or ischemia. 3. Diverticular disease of the left colon without acute inflammatory change. 4. Hyperdense contrast within the vagina, no obvious fistula seen. Question related to vaginal reflux. Aortic Atherosclerosis (ICD10-I70.0) and Emphysema (ICD10-J43.9). These results will be called to the  ordering clinician or representative by the Radiologist Assistant, and communication documented in the PACS or [employer]. Electronically Signed   By: Jasmine Pang M.D.   On: 09/09/2023 23:36   CT ABDOMEN PELVIS WO CONTRAST  Result Date: 09/09/2023 CLINICAL DATA:  Bowel obstruction suspected. EXAM: CT ABDOMEN AND PELVIS WITHOUT CONTRAST TECHNIQUE: Multidetector CT imaging of the abdomen and pelvis was performed following the standard protocol without IV contrast. RADIATION DOSE REDUCTION: This exam was performed according to the departmental dose-optimization program which includes automated exposure control, adjustment of the mA and/or kV according to patient size and/or use of iterative reconstruction technique. COMPARISON:  CTA abdomen and pelvis 08/08/2023 FINDINGS: Lower chest: Mild atelectasis in the lung bases. Coronary atherosclerosis. Normal heart size. No pleural effusion. Hepatobiliary: No focal liver abnormality is identified on this unenhanced study. Gallbladder wall thickening is likely secondary to nondistention, without acute pericholecystic inflammation. No biliary dilatation. Pancreas: Unremarkable. Spleen: Unremarkable. Adrenals/Urinary Tract: Unremarkable adrenal glands. Excreted IV contrast material from today's earlier head and neck CTA in the renal collecting systems, ureters, and bladder. No hydronephrosis. 1.5 cm low-density lesion in the right kidney compatible with a cyst with no follow-up imaging recommended. Small amount of nondependent gas in the bladder, query recent catheterization. Stomach/Bowel: The stomach is unremarkable. Mild left-sided colonic diverticulosis is noted. There is no evidence of bowel obstruction or inflammation. Vascular/Lymphatic: Abdominal aortic atherosclerosis without aneurysm. No enlarged lymph nodes. Reproductive: Uterus and bilateral adnexa are unremarkable. Other: No ascites or pneumoperitoneum. Small fat-containing umbilical hernia.  Musculoskeletal: No suspicious osseous lesion. Widespread thoracolumbar facet arthrosis. Moderate disc degeneration about the thoracolumbar junction. IMPRESSION: 1. No acute abnormality identified in the abdomen or pelvis. 2.  Aortic Atherosclerosis (ICD10-I70.0). Electronically Signed   By: Sebastian Ache M.D.   On: 09/09/2023 08:45   MR ANGIO HEAD WO CONTRAST  Result Date: 09/09/2023 CLINICAL DATA:  Code stroke. 77 year old female last known well 2100 hours. Abnormal speech and right side deficits. Distal basilar occlusion or near occlusion by CTA. EXAM: MRA HEAD WITHOUT CONTRAST TECHNIQUE: Angiographic images of the Circle of Willis were acquired using MRA technique without intravenous contrast. COMPARISON:  CTA head and neck, brain MRI this morning. Previous intracranial MRA 12/10/2011. FINDINGS: Intermittently motion degraded exam. Anterior circulation: Antegrade flow maintained in the distal cervical ICAs and both ICA siphons. Patent carotid termini, MCA and ACA  origins. Motion artifact again degrades detail of the MCA and ACA branches, and multifocal irregularity of those vessels could be artifact. But no anterior circulation large vessel occlusion is evident. Posterior circulation: Dominant right vertebral artery again noted which supplies the basilar. Distal right vertebral, right PICA and proximal basilar arteries are patent. Mid basilar flow signal maintained although more irregular compared to the 2012 MRA. And there is absent flow signal at the basilar tip and right PCA origin concordant with the CTA peer in Stu they (series 17, image 96. Flow signal there was maintained in 2012. Likewise, SCA origins are not identified now, were visible in 2012. Right PCA flow is reconstituted as by CTA. And there is a fetal type left PCA origin again noted. Anatomic variants: Dominant right vertebral artery supplies the basilar. Fetal type left PCA origin. Other: Brain MRI today is reported separately. IMPRESSION:  1. Confirmed Poor flow/Occlusion of the Basilar Artery tip and Right PCA origin, concordant with CTA today. This is new since 2012, and SCA origins also no longer visible by MRA or CTA. See MRI at the same time reported separately. 2. Motion degraded exam. Other evidence of intracranial atherosclerosis, but no other large vessel occlusion identified. Electronically Signed   By: Odessa Fleming M.D.   On: 09/09/2023 05:52   MR BRAIN WO CONTRAST  Result Date: 09/09/2023 CLINICAL DATA:  Code stroke. 77 year old female last known well 2100 hours. Abnormal speech and right side deficits. Distal basilar occlusion or near occlusion by CTA. EXAM: MRI HEAD WITHOUT CONTRAST TECHNIQUE: Multiplanar, multiecho pulse sequences of the brain and surrounding structures were obtained without intravenous contrast. COMPARISON:  CTA head and neck today.  Brain MRI 08/16/2022. FINDINGS: Brain: No restricted diffusion or evidence of acute infarction. Stable cerebral volume. No midline shift, mass effect, evidence of mass lesion, ventriculomegaly, extra-axial collection or acute intracranial hemorrhage. Cervicomedullary junction and pituitary are within normal limits. Patchy bilateral cerebral white matter T2 and FLAIR hyperintensity which is most pronounced in the periatrial regions appears stable from last year. Motion artifact in the posterior fossa on FLAIR imaging today. But T1 and T2 signal in the brainstem and cerebellum appears stable from last year, within normal limits for age. No chronic cerebral blood products on SWI. Vascular: Major intracranial vascular flow voids do appear stable compared to the 2023 MRI, including diminutive and irregular appearance of the distal basilar artery on series 13, image 10. Skull and upper cervical spine: Stable, negative. Sinuses/Orbits: Chronic paranasal sinus disease. Stable postoperative appearance of the orbits. Other: Mastoids remain well aerated. Visible internal auditory structures appear  normal. Negative visible scalp and face. IMPRESSION: 1. No evidence of acute ischemia and essentially stable noncontrast MRI appearance of the brain since last year. This was discussed by telephone with Dr. Erick Blinks on 09/09/2023 at 0515 hours. 2. MRA is reported separately. Electronically Signed   By: Odessa Fleming M.D.   On: 09/09/2023 05:42   CT ANGIO HEAD NECK W WO CM (CODE STROKE)  Result Date: 09/09/2023 CLINICAL DATA:  Code stroke. 77 year old female last known well 2100 hours. Abnormal speech and right side deficits. EXAM: CT ANGIOGRAPHY HEAD AND NECK WITH AND WITHOUT CONTRAST TECHNIQUE: Multidetector CT imaging of the head and neck was performed using the standard protocol during bolus administration of intravenous contrast. Multiplanar CT image reconstructions and MIPs were obtained to evaluate the vascular anatomy. Carotid stenosis measurements (when applicable) are obtained utilizing NASCET criteria, using the distal internal carotid diameter as the denominator. RADIATION  DOSE REDUCTION: This exam was performed according to the departmental dose-optimization program which includes automated exposure control, adjustment of the mA and/or kV according to patient size and/or use of iterative reconstruction technique. CONTRAST:  75mL OMNIPAQUE IOHEXOL 350 MG/ML SOLN COMPARISON:  Plain head CT 0436 hours today. Prior CTA neck 07/01/2020. Previous brain MRA 12/10/2011. And brain MRI last year 08/16/2022. FINDINGS: CTA NECK Skeleton: Chronic paranasal sinus disease. Chronic cervical spine degeneration. Facet arthropathy and C4-C5 facet ankylosis on the right side. Bilateral cervicothoracic facet ankylosis. No acute osseous abnormality identified. Upper chest: Negative. Other neck: No acute finding. Aortic arch: Calcified aortic atherosclerosis.  3 vessel arch. Right carotid system: No significant brachiocephalic artery or right CCA origin plaque or stenosis. Tortuous proximal right CCA. Bulky soft and  calcified plaque along the medial vessel proximal to the bifurcation. Up to 50 % stenosis with respect to the distal vessel (series 7, image 113). Bulky calcified plaque at the right ICA origin and bulb with high-grade stenosis numerically estimated at 69 % with respect to the distal vessel (series 5, image 201). The vessel remains patent to the skull base. This does not appear significantly changed since 2021. Left carotid system: Left CCA origin plaque without stenosis. Patent stent beginning in the distal left CCA and continuing through the ICA, with substantially improved flow there compared to the 2021 CTA. No significant stenosis to the skull base. Vertebral arteries: Proximal right subclavian artery calcified plaque without stenosis. Normal right vertebral artery origin. Dominant appearing right vertebral artery is patent and mildly tortuous to the skull base without stenosis. Proximal left subclavian artery plaque and also motion artifact. No high-grade proximal subclavian artery stenosis. Left vertebral artery is non dominant and highly diminutive. There is chronic calcified plaque at its origin. Diminutive left vertebral artery appears stable since 2021, remains patent although diminutive to the skull base. CTA HEAD Posterior circulation: Dominant right vertebral V4 segment with normal PICA origins supplies the proximal basilar. Diminutive left vertebral artery functionally terminates in PICA as before. Proximal basilar artery is patent, but there is distal basilar stenosis or thrombosis along a segment of 3-4 mm as seen on series 10, image 26. This level was not included on the 2021 CTA, but was patent on the 2012 MR a. there is reconstitution of the right PCA origin, with diminutive or absent right posterior communicating artery on that side. There is a contralateral fetal type left PCA origin. Bilateral PCA branches remain patent with mild irregularity. SCA origins are not identified. Anterior  circulation: Both ICA siphons are patent. Both ICA siphons are calcified. On the left there is mild to moderate siphon stenosis maximal in the supraclinoid segment. On the right there is also mild to moderate supraclinoid stenosis. Normal left posterior communicating artery origin. Patent carotid termini. Dominant appearing right ACA A1, similar to 2012 MRA. Normal anterior communicating artery. Patent MCA origins. Bilateral MCA and ACA branch detail is motion degraded. Both MCA bifurcations appear to remain patent. But MCA and ACA branch detail is otherwise limited. Delayed phase images were obtained (series 14), and seem to demonstrate fairly symmetric A2 and M2 enhancement. Venous sinuses: Patent on the delayed images. Anatomic variants: Chronically dominant right and diminutive left vertebral arteries. Left vertebral functionally terminates in PICA. Fetal type left PCA origin. Dominant right ACA A1. Review of the MIP images confirms the above findings Preliminary report of this exam was discussed by telephone with Dr. Tollie Eth on 09/09/2023 at 04:50 . IMPRESSION: 1. 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 Nonvisualization of the distal 3-4 mm of the Basilar, new since 2012. Involvement of the right PCA origin, but right PCA is reconstituted. SCAs are not identified. Stat Brain MRI being obtained for further evaluation, see that report. This was discussed by telephone with Dr. Tollie Eth on 09/09/2023 at 04:50. 2. Motion degraded MCA and ACA branches with no other strong evidence of ELVO. 3. Substantially improved cervical Left ICA following stenting. High-grade contralateral proximal right ICA stenosis estimated at 69%, not significantly changed from 2021. Calcified ICA siphons with mild to moderate bilateral siphon stenosis. 4. Aortic Atherosclerosis (ICD10-I70.0). Cervical spine degeneration and facet ankylosis. Electronically Signed   By: Odessa Fleming M.D.   On: 09/09/2023 05:26    DG Chest Port 1 View  Result Date: 09/09/2023 CLINICAL DATA:  Code stroke. 77 year old female last known well 2100 hours. Abnormal speech and right side deficits. EXAM: PORTABLE CHEST 1 VIEW COMPARISON:  CTA neck today.  Portable chest 08/15/2022. FINDINGS: Portable AP semi upright view at 0418 hours. Lower lung volumes compared to last year. Platelike left lung base opacity. But elsewhere Allowing for portable technique the lungs are clear. Upper lungs appeared clear on CTA today. Normal cardiac size and mediastinal contours. Visualized tracheal air column is within normal limits. No acute osseous abnormality identified. Paucity of bowel gas. IMPRESSION: Lower lung volumes with basilar atelectasis, especially on the left. Electronically Signed   By: Odessa Fleming M.D.   On: 09/09/2023 04:52   CT Head Code Stroke WO Contrast  Result Date: 09/09/2023 CLINICAL DATA:  Code stroke. 77 year old female last known well 2100 hours. Abnormal speech and right side deficits. EXAM: CT HEAD WITHOUT CONTRAST TECHNIQUE: Contiguous axial images were obtained from the base of the skull through the vertex without intravenous contrast. RADIATION DOSE REDUCTION: This exam was performed according to the departmental dose-optimization program which includes automated exposure control, adjustment of the mA and/or kV according to patient size and/or use of iterative reconstruction technique. COMPARISON:  Brain MRI 08/16/2022.  Head CT 08/15/2022. FINDINGS: Brain: Stable cerebral volume, normal for age. Stable gray-white matter differentiation throughout the brain. No midline shift, ventriculomegaly, mass effect, evidence of mass lesion, intracranial hemorrhage or evidence of cortically based acute infarction. Vascular: Calcified atherosclerosis at the skull base. No suspicious intracranial vascular hyperdensity. Skull: Stable.  No acute osseous abnormality identified. Sinuses/Orbits: Chronic paranasal sinus disease most pronounced in  the right sphenoid and maxillary is stable. Chronic right ethmoidectomy. Tympanic cavities and mastoids remain clear. Other: No gaze deviation. Visualized scalp soft tissues are within normal limits. ASPECTS [facility] Stroke Program Early CT Score) Total score (0-10 with 10 being normal): 10 IMPRESSION: 1. Stable and negative for age noncontrast CT appearance of the brain. ASPECTS 10. 2. The above communicated to Dr. Derry Lory at 4:44 am on 09/09/2023 by text page via the [facility] messaging system. 3. Chronic paranasal sinus disease. Electronically Signed   By: Odessa Fleming M.D.   On: 09/09/2023 04:45    Labs:  CBC: Recent Labs    08/11/23 1005 09/09/23 0539 09/09/23 0543 09/11/23 1134 09/12/23 0746  WBC 5.5 13.9*  --  5.6 5.0  HGB 9.6* 9.8* 10.5* 9.3* 10.2*  HCT 29.3* 33.0* 31.0* 29.6* 32.2*  PLT 206 356  --  285 309    COAGS: Recent Labs    08/07/23 0944 09/09/23 0539  INR 0.9 1.0  APTT 26 22*    BMP: Recent Labs    08/11/23 1005 09/09/23 0539  09/09/23 0543 09/11/23 1134 09/12/23 0746  NA 138 137 135 137 139  K 4.1 4.4 4.6 3.9 3.4*  CL 104 102 106 97* 101  CO2 26 17*  --  22 21*  GLUCOSE 177* 178* 178* 126* 131*  BUN 10 23 23 8 8   CALCIUM 7.6* 8.5*  --  8.7* 9.0  CREATININE 0.79 1.17* 1.20* 0.86 0.81  GFRNONAA >60 49*  --  >60 >60    LIVER FUNCTION TESTS: Recent Labs    08/08/23 0525 09/09/23 0539 09/11/23 1134 09/12/23 0746  BILITOT 0.6 0.3 0.5 0.8  AST 12* 20 17 17   ALT 12 14 13 18   ALKPHOS 37* 63 68 71  PROT 4.0* 5.4* 5.4* 5.9*  ALBUMIN 2.3* 3.0* 2.6* 2.8*    TUMOR MARKERS: No results for input(s): "AFPTM", "CEA", "CA199", "CHROMGRNA" in the last 8760 hours.  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  Long discussion today with patient and her husband lead by Dr. Corliss Skains, as detailed above risks and benefits of each option (medical management vs cerebral angiogram with  possible angioplasty/stent placement). Patient and her husband with good insight into medical condition as well as treatment options, all questions answered.  Patient and husband remain undecided at this time, reports leaning toward medication management which we discussed was a reasonable option given no stroke on recent MRA. We discussed that regardless of treatment plan that is decided upon she will need to maintain good DM control, quit smoking, abstain from ETOH/drugs, maintain appropriate BP/HLD which she understands.  NIR will follow up with patient tomorrow to discuss further.  Thank you for this interesting consult.  I greatly enjoyed meeting [name] and look forward to participating in their [name].  A copy of this report was sent to the requesting provider on this date.  Electronically Signed: Villa Herb, PA-C 09/12/2023, 2:39 PM   I spent a total of 40 Minutes  in face to face in clinical consultation, greater than 50% of which was counseling/coordinating [name] for basilar artery stenosis.

## 2023-09-12 NOTE — Progress Notes (Signed)
Physical Therapy Treatment Patient Details Name: [name] MRN: [id number] DOB: 1946/11/15 Today's Date: 09/12/2023   History of Present Illness 77 y.o. female presents to [facility] ED 9/27 with nausea/vomiting and mild right sided numbness, greatest in right lower limb, as well as AMS. 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 Approximately 50% stenosis of the proximal right ICA.   With history of diverticulitis, hypertension, hyperlipidemia, myalgias/myositis/fibromyalgia, carotid artery stenosis, right ICA stenosis status post stenting, former alcohol use, and an appendectomy 2 weeks ago.    PT Comments  Tolerated well, increased ambulatory distance today and progressed gait without an assistive device at a supervision level. Reviewed LE exercises. Encouraged OOB to chair but declines due to nausea currently. Patient will continue to benefit from skilled physical therapy services to further improve independence with functional mobility.     If plan is discharge home, recommend the following: A little help with walking and/or transfers;A little help with bathing/dressing/bathroom;Assistance with cooking/housework;Assist for transportation;Help with stairs or ramp for entrance   Can travel by private vehicle        Equipment Recommendations  Other (comment) (TBD, does not recall what equipment she already has - will need to confirm with husband after gait assessment.)    Recommendations for Other Services       Precautions / Restrictions Precautions Precautions: Fall Restrictions Weight Bearing Restrictions: No     Mobility  Bed Mobility Overal bed mobility: Modified Independent Bed Mobility: Supine to Sit, Sit to Supine     Supine to sit: Modified independent (Device/Increase time)     General bed mobility comments: Mod I extra time, no assist in/out of bed.    Transfers Overall transfer level: Needs  assistance Equipment used: None Transfers: Sit to/from Stand Sit to Stand: Supervision           General transfer comment: Supervision for safety,  stable upon rising, reaches for furntiure but denies RW use.    Ambulation/Gait Ambulation/Gait assistance: Supervision Gait Distance (Feet): 100 Feet Assistive device: None, IV Pole Gait Pattern/deviations: Step-through pattern, Decreased stride length Gait velocity: decr Gait velocity interpretation: <1.8 ft/sec, indicate of risk for recurrent falls   General Gait Details: A little slower and guarded, initially with IV pole for support, declined RW use. On return route, pt without AD, walking without overt LOB or evidence of buckling. Did require cues to recall which room to return to. Supervision for safety.   Stairs             Wheelchair Mobility     Tilt Bed    Modified Rankin (Stroke Patients Only) Modified Rankin (Stroke Patients Only) Pre-Morbid Rankin Score: No symptoms Modified Rankin: Moderately severe disability     Balance Overall balance assessment: Needs assistance Sitting-balance support: No upper extremity supported, Feet supported Sitting balance-Leahy Scale: Good     Standing balance support: No upper extremity supported, During functional activity Standing balance-Leahy Scale: Fair Standing balance comment: increased sway, reaching for furniture                            Cognition Arousal: Alert Behavior During Therapy: Lability Overall Cognitive Status: Within Functional Limits for tasks assessed                                 General Comments: Responds to  questions out of context at times, unsure if due to [facility], she is able to correct response if repeated.        Exercises General Exercises - Lower Extremity Ankle Circles/Pumps: AROM, Both, 10 reps, Supine Quad Sets: Strengthening, Both, 10 reps, Supine Gluteal Sets: Strengthening, Both, 10 reps,  Supine    General Comments General comments (skin integrity, edema, etc.): Husband [name] present. pt refused sitting in chair, making several excuses despite efforts to appease needs if she were to sit upright. Encouraged pt and husband to get OOB to chair with staff more frequently to minimize risks associated with immobility.      Pertinent Vitals/Pain Pain Assessment Pain Assessment: No/denies pain    Home Living                          Prior Function            PT Goals (current goals can now be found in the care plan section) Acute Rehab PT Goals Patient Stated Goal: none PT Goal Formulation: With patient Time For Goal Achievement: 09/23/23 Potential to Achieve Goals: Good Progress towards PT goals: Progressing toward goals    Frequency    Min 1X/week      PT Plan      Co-evaluation              AM-PAC PT "6 Clicks" Mobility   Outcome Measure  Help needed turning from your back to your side while in a flat bed without using bedrails?: None Help needed moving from lying on your back to sitting on the side of a flat bed without using bedrails?: None Help needed moving to and from a bed to a chair (including a wheelchair)?: A Little Help needed standing up from a chair using your arms (e.g., wheelchair or bedside chair)?: A Little Help needed to walk in hospital room?: A Little Help needed climbing 3-5 steps with a railing? : A Little 6 Click Score: 20    End of Session Equipment Utilized During Treatment: Gait belt Activity Tolerance: Patient tolerated treatment well Patient left: in bed;with bed alarm set;with call bell/phone within reach;with family/visitor present   PT Visit Diagnosis: Unsteadiness on feet (R26.81);Hemiplegia and hemiparesis;Difficulty in walking, not elsewhere classified (R26.2);Other symptoms and signs involving the nervous system (R29.898)     Time: 9604-5409 PT Time Calculation (min) (ACUTE ONLY): 12 min  Charges:     $Gait Training: 8-22 mins PT General Charges $$ ACUTE PT VISIT: 1 Visit                     Kathlyn Sacramento, PT, DPT [facility]  Rehabilitation Services Physical Therapist Office: [phone] Website: [email or web address]    Berton Mount 09/12/2023, 11:52 AM

## 2023-09-12 NOTE — Plan of Care (Signed)
  Problem: Education: Goal: Knowledge of General Education information will improve Description: Including pain rating scale, medication(s)/side effects and non-pharmacologic comfort measures 09/12/2023 2359 by Carolyne Littles, RN Outcome: Progressing 09/12/2023 2359 by Carolyne Littles, RN Outcome: Progressing   Problem: Health Behavior/Discharge Planning: Goal: Ability to manage health-related needs will improve 09/12/2023 2359 by Carolyne Littles, RN Outcome: Progressing 09/12/2023 2359 by Carolyne Littles, RN Outcome: Progressing   Problem: Clinical Measurements: Goal: Ability to maintain clinical measurements within normal limits will improve 09/12/2023 2359 by Carolyne Littles, RN Outcome: Progressing 09/12/2023 2359 by Carolyne Littles, RN Outcome: Progressing Goal: Will remain free from infection 09/12/2023 2359 by Carolyne Littles, RN Outcome: Progressing 09/12/2023 2359 by Carolyne Littles, RN Outcome: Progressing Goal: Diagnostic test results will improve 09/12/2023 2359 by Carolyne Littles, RN Outcome: Progressing 09/12/2023 2359 by Carolyne Littles, RN Outcome: Progressing Goal: Respiratory complications will improve 09/12/2023 2359 by Carolyne Littles, RN Outcome: Progressing 09/12/2023 2359 by Carolyne Littles, RN Outcome: Progressing Goal: Cardiovascular complication will be avoided 09/12/2023 2359 by Carolyne Littles, RN Outcome: Progressing 09/12/2023 2359 by Carolyne Littles, RN Outcome: Progressing

## 2023-09-12 NOTE — TOC Initial Note (Signed)
Transition of Care [facility]) - Initial/Assessment Note    Patient Details  Name: [name] MRN: [id number] Date of Birth: [birth year]  Transition of [facility]) CM/SW Contact:    Kermit Balo, RN Phone Number: 09/12/2023, 10:42 AM  Clinical Narrative:                  Patient is from home with her spouse. They are together most of the time.  Spouse provides needed transportation.  Pt was managing her own medications at home but spouse can over see them after d/c.  Pt was active with [facility] prior to admission. Pt and spouse would like her to continue with these services.  TOC following.   Expected Discharge Plan: OP Rehab Barriers to Discharge: Continued Medical Work up   Patient Goals and CMS Choice   CMS Medicare.gov Compare Post Acute Care list provided to:: Patient Represenative (must comment) Choice offered to / list presented to : Patient, Spouse      Expected Discharge Plan and Services   Discharge Planning Services: CM Consult   Living arrangements for the past 2 months: Single Family Home                                      Prior Living Arrangements/Services Living arrangements for the past 2 months: Single Family Home Lives with:: Spouse Patient language and need for interpreter reviewed:: Yes Do you feel safe going back to the place where you live?: Yes        Care giver support system in place?: Yes (comment) Current home services: DME (cane) Criminal Activity/Legal Involvement Pertinent to Current Situation/Hospitalization: No - Comment as needed  Activities of Daily Living   ADL Screening (condition at time of admission) Does the patient have a NEW difficulty with bathing/dressing/toileting/self-feeding that is expected to last >3 days?: No Does the patient have a NEW difficulty with getting in/out of bed, walking, or climbing stairs that is expected to last >3 days?: Yes (Initiates electronic notice to provider for possible  PT consult) Does the patient have a NEW difficulty with communication that is expected to last >3 days?: No Is the patient deaf or have difficulty hearing?: No Does the patient have difficulty seeing, even when wearing glasses/contacts?: No Does the patient have difficulty concentrating, remembering, or making decisions?: No  Permission Sought/Granted                  Emotional Assessment Appearance:: Appears stated age   Affect (typically observed): Quiet Orientation: : Oriented to Self, Oriented to Place, Oriented to  Time, Oriented to Situation   Psych Involvement: No (comment)  Admission diagnosis:  TIA (transient ischemic attack) [G45.9] Patient Active Problem List   Diagnosis Date Noted   TIA (transient ischemic attack) 09/09/2023   Hemorrhagic shock (HCC) 08/08/2023   ABLA (acute blood loss anemia) 08/08/2023   Lower GI bleed 08/07/2023   Acute blood loss anemia (ABLA) 08/07/2023   Hyperkalemia 08/07/2023   Hypocalcemia 08/07/2023   Mild protein malnutrition (HCC) 08/07/2023   Statin myopathy 08/04/2022   Carotid stenosis, bilateral 08/07/2020   Statin intolerance 02/22/2020   Carotid artery stenosis 10/03/2018   CAP (community acquired pneumonia) 05/05/2018   Acute respiratory failure with hypoxia (HCC) 05/05/2018   DM (diabetes mellitus), type 2, uncontrolled 05/05/2018   Tobacco dependency 05/05/2018   SOB (shortness of breath) on exertion 03/09/2018  UTI (urinary tract infection) 01/25/2016   Elevated LFTs 01/24/2016   Hyperbilirubinemia    History of alcohol abuse 09/16/2014   Essential hypertension, benign 09/16/2014   Anxiety state 09/16/2014   Chronic pain syndrome 09/16/2014   Alcohol intoxication (HCC) 08/28/2014   Hepatitis 08/28/2014   DM (diabetes mellitus), type 2, uncontrolled w/ophthalmic complication (HCC) 07/22/2014   Hypotension 07/14/2014   Alcohol withdrawal (HCC) 07/14/2014   EtOH dependence (HCC) 06/23/2014   Alcohol withdrawal  with perceptual disturbances (HCC) 06/09/2014   Hyponatremia 06/09/2014   Alcohol abuse, continuous 05/29/2012    Class: Acute   Chest pain on exertion 03/28/2012   Elevated C-reactive protein 03/28/2012   Encounter for long-term (current) use of medications 03/28/2012   H/O urethrotomy 03/28/2012   Myalgia and myositis 03/28/2012   Nausea 03/28/2012   Tachycardia 03/28/2012   Diabetes mellitus, type 2 (HCC) 03/28/2012   Hyperlipidemia 08/20/2007   ANXIETY 08/20/2007   DEPRESSION 08/20/2007   Essential hypertension 08/20/2007   PCP:  Marguarite Arbour, MD Pharmacy:   [facility] [phone] [address], [place] - [address] [place] [address] Phone: [phone] Fax: [phone]     Social Determinants of Health (SDOH) Social History: SDOH Screenings   Food Insecurity: No Food Insecurity (09/09/2023)  Housing: Low Risk  (09/09/2023)  Transportation Needs: No Transportation Needs (09/09/2023)  Utilities: Not At Risk (09/09/2023)  Tobacco Use: High Risk (09/09/2023)   SDOH Interventions:     Readmission Risk Interventions    08/10/2023    6:17 PM  Readmission Risk Prevention Plan  Post Dischage Appt Complete  Medication Screening Complete  Transportation Screening Complete

## 2023-09-12 NOTE — Progress Notes (Addendum)
[facility]  PROGRESS NOTE  [name] UEA:[id number] DOB: 1946-08-03 DOA: 09/09/2023 PCP: Marguarite Arbour, MD   Brief HPI:   77 y.o. female with medical history significant of HTN, HLD, diverticulosis, DM2, ETOH abuse in remission, right ICA stenosis status post stenting, interim history of admission 8/25/-8/29 for lower gi bleed presumed diverticular with associated hemorrhagic shock which required icu admission while on DAPT.  Patient course further complicated by appendectomy 2 weeks. Patient presents  who further interim history of feeling poorly over the last few days with n/v/d and abdominal  pain. However on waking this am around 2 am with  a bout of n/v she noted weakness/numbness in right leg. Due to this EMS was called.    Assessment/Plan:   TIA of posterior circulation -Has significant basilar artery stenosis -Started on aspirin and Brilinta -Plan for stenting of the basilar artery once leukocytosis resolves -Aspirin 81 mg and Plavix 75 mg at home, now on ASA and brilinta in preparation for BA stenting.   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 -Discussed with urology, treat with antibiotics, started on Zosyn -Urine culture growing Klebsiella; sensitive to Zosyn  Hypokalemia -Potassium is 3.4 -Will replace potassium and follow BMP in am  Mild thickening and inflammation of ileocecal junction/colitis -Had recent appendectomy 2 weeks ago -Likely postop changes -Started on Zosyn as above   Diabetes mellitus type 2 -Continue sliding scale insulin with NovoLog -CBG well-controlled  Recent history of GI bleed -Hemoglobin stable at 10.5 -Follow CBC in a.m.  Hypertension -BP goal 1 30-160 before basilar artery revascularization -Continue nebivolol, will add lisinopril 5 mg daily  Anxiety -Anxiety improved with Xanax, also got Ativan last night -Start Xanax 0.5 mg 3 times daily as needed for anxiety  Diarrhea -She has history of chronic  diarrhea -Takes Imodium as needed at home -Start Imodium as needed  Medications     aspirin  81 mg Oral Daily   enoxaparin (LOVENOX) injection  40 mg Subcutaneous Q24H   insulin aspart  0-9 Units Subcutaneous TID WC   lisinopril  5 mg Oral Daily   mupirocin ointment  1 Application Nasal BID   nebivolol  5 mg Oral Daily   potassium chloride  40 mEq Oral Once   rosuvastatin  20 mg Oral Daily   ticagrelor  90 mg Oral BID     Data Reviewed:   CBG:  Recent Labs  Lab 09/10/23 2104 09/11/23 0609 09/11/23 1020 09/11/23 1537 09/12/23 0638  GLUCAP 108* 128* 142* 132* 127*    SpO2: 99 % O2 Flow Rate (L/min): 2 L/min    Vitals:   09/11/23 2308 09/12/23 0038 09/12/23 0355 09/12/23 0906  BP: (!) 190/92 (!) 164/77 (!) 173/98 (!) 159/92  Pulse: 87 91 (!) 101 (!) 117  Resp:    18  Temp: 98.6 F (37 C)  98.4 F (36.9 C) 97.7 F (36.5 C)  TempSrc: Oral  Oral Oral  SpO2: 97%  100% 99%  Weight:      Height:          Data Reviewed:  Basic Metabolic Panel: Recent Labs  Lab 09/09/23 0539 09/09/23 0543 09/11/23 1134 09/12/23 0746  NA 137 135 137 139  K 4.4 4.6 3.9 3.4*  CL 102 106 97* 101  CO2 17*  --  22 21*  GLUCOSE 178* 178* 126* 131*  BUN 23 23 8 8   CREATININE 1.17* 1.20* 0.86 0.81  CALCIUM 8.5*  --  8.7* 9.0  CBC: Recent Labs  Lab 09/09/23 0539 09/09/23 0543 09/11/23 1134 09/12/23 0746  WBC 13.9*  --  5.6 5.0  NEUTROABS 11.4*  --   --   --   HGB 9.8* 10.5* 9.3* 10.2*  HCT 33.0* 31.0* 29.6* 32.2*  MCV 94.6  --  88.6 86.1  PLT 356  --  285 309    LFT Recent Labs  Lab 09/09/23 0539 09/11/23 1134 09/12/23 0746  AST 20 17 17   ALT 14 13 18   ALKPHOS 63 68 71  BILITOT 0.3 0.5 0.8  PROT 5.4* 5.4* 5.9*  ALBUMIN 3.0* 2.6* 2.8*     Antibiotics: Anti-infectives (From admission, onward)    Start     Dose/Rate Route Frequency Ordered Stop   09/10/23 1030  piperacillin-tazobactam (ZOSYN) IVPB 3.375 g        3.375 g 12.5 mL/hr over 240 Minutes  Intravenous Every 8 hours 09/10/23 0943          DVT prophylaxis: Lovenox  Code Status: Full code  Family Communication: No family at bedside   CONSULTS neurology   Subjective   Feels better this morning, still has mild abdominal discomfort.  Blood pressure has been elevated.  Having loose stools.  Objective    Physical Examination:   Appears in no acute distress S1-S2, regular Lungs clear to auscultation bilaterally Abdomen is soft, mild tenderness in suprapubic region Extremities no edema   Status is: Inpatient:             [name]   [facility] If 7PM-7AM, please contact night-coverage at [email or web address], Office  [phone]   09/12/2023, 11:09 AM  LOS: 3 days

## 2023-09-13 DIAGNOSIS — K529 Noninfective gastroenteritis and colitis, unspecified: Secondary | ICD-10-CM

## 2023-09-13 DIAGNOSIS — R933 Abnormal findings on diagnostic imaging of other parts of digestive tract: Secondary | ICD-10-CM

## 2023-09-13 DIAGNOSIS — G459 Transient cerebral ischemic attack, unspecified: Secondary | ICD-10-CM | POA: Diagnosis not present

## 2023-09-13 LAB — BASIC METABOLIC PANEL
Anion gap: 6 (ref 5–15)
BUN: 6 mg/dL — ABNORMAL LOW (ref 8–23)
CO2: 25 mmol/L (ref 22–32)
Calcium: 8.9 mg/dL (ref 8.9–10.3)
Chloride: 107 mmol/L (ref 98–111)
Creatinine, Ser: 0.86 mg/dL (ref 0.44–1.00)
GFR, Estimated: >60 mL/min (ref >60)
Glucose, Bld: 105 mg/dL — ABNORMAL HIGH (ref 70–99)
Potassium: 4.5 mmol/L (ref 3.5–5.1)
Sodium: 138 mmol/L (ref 135–145)

## 2023-09-13 LAB — CBC
HCT: 31.2 % — ABNORMAL LOW (ref 36.0–46.0)
Hemoglobin: 9.6 g/dL — ABNORMAL LOW (ref 12.0–15.0)
MCH: 27.1 pg (ref 26.0–34.0)
MCHC: 30.8 g/dL (ref 30.0–36.0)
MCV: 88.1 fL (ref 80.0–100.0)
Platelets: 295 10*3/uL (ref 150–400)
RBC: 3.54 MIL/uL — ABNORMAL LOW (ref 3.87–5.11)
RDW: 18.8 % — ABNORMAL HIGH (ref 11.5–15.5)
WBC: 4.3 10*3/uL (ref 4.0–10.5)
nRBC: 0 % (ref 0.0–0.2)

## 2023-09-13 LAB — GLUCOSE, CAPILLARY
Glucose-Capillary: 103 mg/dL — ABNORMAL HIGH (ref 70–99)
Glucose-Capillary: 95 mg/dL (ref 70–99)
Glucose-Capillary: 96 mg/dL (ref 70–99)
Glucose-Capillary: 100 mg/dL — ABNORMAL HIGH (ref 70–99)

## 2023-09-13 LAB — C DIFFICILE (CDIFF) QUICK SCRN (NO PCR REFLEX)
C Diff antigen: POSITIVE — AB
C Diff toxin: NEGATIVE

## 2023-09-13 MED ORDER — ORAL CARE MOUTH RINSE: 15.0000 mL | OROMUCOSAL | Status: DC | PRN

## 2023-09-13 MED ORDER — LISINOPRIL 10 MG PO TABS
10.0000 mg | ORAL_TABLET | Freq: Every day | ORAL | Status: DC
  Administered 2023-09-14 – 2023-09-20 (×6): 10 mg via ORAL
  Filled 2023-09-13 (×6): qty 1

## 2023-09-13 NOTE — Plan of Care (Signed)
  Problem: Education: Goal: Knowledge of General Education information will improve Description Including pain rating scale, medication(s)/side effects and non-pharmacologic comfort measures Outcome: Progressing   

## 2023-09-13 NOTE — Care Management Important Message (Signed)
Important Message  Patient Details  Name: [name] MRN: [id number] Date of Birth: May 11, 1946   Important Message Given:  Yes - Medicare IM     Dorena Bodo 09/13/2023, 3:43 PM

## 2023-09-13 NOTE — Progress Notes (Addendum)
[facility]  PROGRESS NOTE  [name]:[id number] DOB: [birth year] DOA: 09/09/2023 PCP: Marguarite Arbour, MD   Brief HPI:   77 y.o. female with medical history significant of HTN, HLD, diverticulosis, DM2, ETOH abuse in remission, right ICA stenosis status post stenting, interim history of admission 8/25/-8/29 for lower gi bleed presumed diverticular with associated hemorrhagic shock which required icu admission while on DAPT.  Patient course further complicated by appendectomy 2 weeks. Patient presents  who further interim history of feeling poorly over the last few days with n/v/d and abdominal  pain. However on waking this am around 2 am with  a bout of n/v she noted weakness/numbness in right leg. Due to this EMS was called.    Assessment/Plan:   TIA of posterior circulation -Has significant basilar artery stenosis -Started on aspirin and Brilinta -Plan for stenting of the basilar artery once leukocytosis resolves -Aspirin 81 mg and Plavix 75 mg at home, now on ASA and brilinta in preparation for BA stenting.  -Discussed with Dorethea Clan; will obtain blood culture x 2 -Check a repeat urinalysis and culture after 7 days of antibiotics before stenting of basilar artery  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 -Discussed with urology, treat with antibiotics, started on Zosyn -Urine culture growing Klebsiella; sensitive to Zosyn -Will treat for 7 days  Hypokalemia -Replete  Mild thickening and inflammation of ileocecal junction/colitis -Continues to have diarrhea and right lower quadrant pain -Had recent appendectomy 2 weeks ago -Likely postop changes -Started on Zosyn as above -Check GI pathogen panel, stool for C. difficile -General Surgery consulted; no surgical intervention recommended -Will consult gastroenterology   Diabetes mellitus type 2 -Continue sliding scale insulin with NovoLog -CBG well-controlled  Recent history of GI  bleed -Hemoglobin stable at 10.5 -Follow CBC in a.m.  Hypertension -BP goal 1 30-160 before basilar artery revascularization -Continue nebivolol, -BP continues to be elevated, will increase lisinopril to 10 mg daily -Continue as needed IV hydralazine  Anxiety -Anxiety improved with Xanax, also got Ativan last night -Start Xanax 0.5 mg 3 times daily as needed for anxiety  Diarrhea -She has history of chronic diarrhea -Takes Imodium as needed at home -Started Imodium as needed  Medications     aspirin  81 mg Oral Daily   enoxaparin (LOVENOX) injection  40 mg Subcutaneous Q24H   insulin aspart  0-9 Units Subcutaneous TID WC   lisinopril  5 mg Oral Daily   mupirocin ointment  1 Application Nasal BID   nebivolol  5 mg Oral Daily   rosuvastatin  20 mg Oral Daily   ticagrelor  90 mg Oral BID     Data Reviewed:   CBG:  Recent Labs  Lab 09/12/23 1224 09/12/23 1646 09/12/23 2113 09/13/23 0636 09/13/23 1114  GLUCAP 101* 90 109* 103* 95    SpO2: 98 % O2 Flow Rate (L/min): 2 L/min    Vitals:   09/13/23 0347 09/13/23 0721 09/13/23 1043 09/13/23 1115  BP: (!) 177/64 (!) 165/72 (!) 152/57 (!) 166/61  Pulse: 66 79 (!) 56 (!) 56  Resp: 18 18  18   Temp: 97.9 F (36.6 C) 98.6 F (37 C)  98.6 F (37 C)  TempSrc: Oral Oral  Oral  SpO2: 99% 99%  98%  Weight:      Height:          Data Reviewed:  Basic Metabolic Panel: Recent Labs  Lab 09/09/23 0539 09/09/23 0543 09/11/23 1134 09/12/23 0746 09/13/23 0623  NA 137  135 137 139 138  K 4.4 4.6 3.9 3.4* 4.5  CL 102 106 97* 101 107  CO2 17*  --  22 21* 25  GLUCOSE 178* 178* 126* 131* 105*  BUN 23 23 8 8  6*  CREATININE 1.17* 1.20* 0.86 0.81 0.86  CALCIUM 8.5*  --  8.7* 9.0 8.9    CBC: Recent Labs  Lab 09/09/23 0539 09/09/23 0543 09/11/23 1134 09/12/23 0746 09/13/23 0623  WBC 13.9*  --  5.6 5.0 4.3  NEUTROABS 11.4*  --   --   --   --   HGB 9.8* 10.5* 9.3* 10.2* 9.6*  HCT 33.0* 31.0* 29.6* 32.2* 31.2*   MCV 94.6  --  88.6 86.1 88.1  PLT 356  --  285 309 295    LFT Recent Labs  Lab 09/09/23 0539 09/11/23 1134 09/12/23 0746  AST 20 17 17   ALT 14 13 18   ALKPHOS 63 68 71  BILITOT 0.3 0.5 0.8  PROT 5.4* 5.4* 5.9*  ALBUMIN 3.0* 2.6* 2.8*     Antibiotics: Anti-infectives (From admission, onward)    Start     Dose/Rate Route Frequency Ordered Stop   09/10/23 1030  piperacillin-tazobactam (ZOSYN) IVPB 3.375 g        3.375 g 12.5 mL/hr over 240 Minutes Intravenous Every 8 hours 09/10/23 0943          DVT prophylaxis: Lovenox  Code Status: Full code  Family Communication: No family at bedside   CONSULTS neurology   Subjective   Continues to have diarrhea and right lower quadrant pain.  Objective    Physical Examination:   Appears in no acute distress S1-S2, regular Lungs clear to auscultation bilaterally Abdomen is soft, tenderness to palpation in right lower quadrant Extremities no edema   Status is: Inpatient:             [name]   [facility] If 7PM-7AM, please contact night-coverage at [email or web address], Office  [phone]   09/13/2023, 2:31 PM  LOS: 4 days

## 2023-09-13 NOTE — Consult Note (Signed)
Consultation  Referring Provider:   Dr. Philipp Ovens Primary Care Physician:  Marguarite Arbour, MD Primary Gastroenterologist:  [facility] clinic Dr. Norma Fredrickson 2022, Dr. Evette Cristal 2011       Reason for Consultation:   Diarrhea s/p appendectomy, colitis seen on CT  DOA: 09/09/2023         Hospital Day: 5         HPI:   [name] is a 77 y.o. female with past medical history significant for chronic diarrhea, GERD, esophageal dysphagia, hepatic steatosis, PAD with history of left carotid stenosis status post stenting 2021  on Plavix, type 2 diabetes, hypertension, hyperlipidemia, history of cancer,  alcohol abuse in remission  01/01/2021 office visit with [name] for chronic diarrhea and FOBT positive stools.  2021 CT abdomen pelvis showed possible thickening of mid ascending colon.  Fecal calprotectin the time 44, pancreatic elastase negative. 02/18/2021 EGD and colonoscopy Dr. Norma Fredrickson  Colonoscopy bowel prep fair except cecum was poor irrigated, 2 polyps 6 to 8 mm ascending colon, mild diverticulosis sigmoid and descending, internal hemorrhoids otherwise unremarkable.  Await pathology.  Random biopsies were not performed Endoscopy showed normal esophagus normal stomach normal duodenum biopsies taken for celiac Pathology negative for celiac, dysplasia suggest peptic duodenitis.   Colon polyp tubular adenomatous negative for high-grade dysplasia  08/07/2023  admitted [facility] long  for rectal bleeding,  hemorrhagic shock requiring ICU, seen by [facility] GI unassigned, thought potentially diverticula  CT angio negative,   No evidence of bowel wall thickening, distention or inflammatory changes seen at that time. Recent appendectomy  3 weeks ago in [place].  Presents to the ER with 9/27  right-sided weakness, nausea and vomiting. 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  Suspicion of mild wall thickening inflammation at the ileocecal junction and proximal ascending colon which may be due to distal ileus/colitis from an IBD or ischemia.  Diverticular disease with inflammation, hypertense contrast within the vagina no obvious fistulous seen.   Urology consulted,     On Zosyn with resolved leukocytosis. General surgery consulted 10/1/20202024 due to recent appendectomy,   No signs of complications, abscess.  Recommended gastroenterology consult. Plan with IR angioplasty/stent next week after finishing antibiotics and resolution of infectious source. Pending GI pathogen and C. Difficile. WBC 4.3, Hgb 9.6 which appears to be around her baseline, platelets 295  Patient with family at bedside, [name]. Provided some of the history.  Patient states she has had chronic diarrhea intermittently since college, with associated abdominal cramping improved after bowel movement.  Can have formed stools to, never constipation.  She is having formed stools 1 daily, with loose stools can happen 3 or more times a day.  Responds very well to Lomotil which she takes 2 to 3 days at least once a month. She will take 2 Lomotil in the morning and be good for the rest of the day. After her appendectomy 3 weeks ago in [place] patient's had more diarrhea than normal 3+ stools a day, not as well-controlled with Lomotil, associated nausea, vomiting, decreased appetite, no nocturnal symptoms, worse after food.   Patient denies melena or hematochezia. Since being the hospital she has been on Zosyn for  the past 3 days and states the diarrhea worsened.  Denies fever, chills, weight loss.  No current abdominal pain. Denies family history of autoimmune disease, rashes, joint pain, no family history of colon  cancer. Patient has remote history of drinking with alcohol abuse quit 8 years ago, no tobacco use no drug use.  Abnormal ED labs: Abnormal Labs Reviewed  URINE CULTURE - Abnormal; Notable for the following components:      Result Value   Culture >=100,000 COLONIES/mL KLEBSIELLA OXYTOCA (*)    Organism ID, Bacteria KLEBSIELLA OXYTOCA (*)    All other components within normal limits  MRSA NEXT GEN BY PCR, NASAL - Abnormal; Notable for the following components:   MRSA by PCR Next Gen DETECTED (*)    All other components within normal limits  COMPREHENSIVE METABOLIC PANEL - Abnormal; Notable for the following components:   CO2 17 (*)    Glucose, Bld 178 (*)    Creatinine, Ser 1.17 (*)    Calcium 8.5 (*)    Total Protein 5.4 (*)    Albumin 3.0 (*)    GFR, Estimated 49 (*)    Anion gap 18 (*)    All other components within normal limits  CBC WITH DIFFERENTIAL/PLATELET - Abnormal; Notable for the following components:   WBC 13.9 (*)    RBC 3.49 (*)    Hemoglobin 9.8 (*)    HCT 33.0 (*)    MCHC 29.7 (*)    RDW 19.8 (*)    Neutro Abs 11.4 (*)    Abs Immature Granulocytes 0.17 (*)    All other components within normal limits  URINALYSIS, W/ REFLEX TO CULTURE (INFECTION SUSPECTED) - Abnormal; Notable for the following components:   APPearance HAZY (*)    Specific Gravity, Urine 1.035 (*)    Glucose, UA >=500 (*)    Hgb urine dipstick MODERATE (*)    Leukocytes,Ua LARGE (*)    All other components within normal limits  APTT - Abnormal; Notable for the following components:   aPTT 22 (*)    All other components within normal limits  RAPID URINE DRUG SCREEN, HOSP PERFORMED - Abnormal; Notable for the following components:   Opiates POSITIVE (*)    Benzodiazepines POSITIVE (*)    Amphetamines POSITIVE (*)    Tetrahydrocannabinol POSITIVE (*)    All other components within normal limits  LIPID PANEL - Abnormal; Notable for the following components:   Cholesterol 214 (*)     Triglycerides 299 (*)    HDL 37 (*)    VLDL 60 (*)    LDL Cholesterol 117 (*)    All other components within normal limits  HEMOGLOBIN A1C - Abnormal; Notable for the following components:   Hgb A1c MFr Bld 5.7 (*)    All other components within normal limits  C-REACTIVE PROTEIN - Abnormal; Notable for the following components:   CRP 2.0 (*)    All other components within normal limits  GLUCOSE, CAPILLARY - Abnormal; Notable for the following components:   Glucose-Capillary 119 (*)    All other components within normal limits  GLUCOSE, CAPILLARY - Abnormal; Notable for the following components:   Glucose-Capillary 115 (*)    All other components within normal limits  GLUCOSE, CAPILLARY - Abnormal; Notable for the following components:   Glucose-Capillary 126 (*)    All other components within normal limits  GLUCOSE, CAPILLARY - Abnormal; Notable for the following components:   Glucose-Capillary 108 (*)    All other components  within normal limits  CBC - Abnormal; Notable for the following components:   RBC 3.34 (*)    Hemoglobin 9.3 (*)    HCT 29.6 (*)    RDW 18.6 (*)    All other components within normal limits  COMPREHENSIVE METABOLIC PANEL - Abnormal; Notable for the following components:   Chloride 97 (*)    Glucose, Bld 126 (*)    Calcium 8.7 (*)    Total Protein 5.4 (*)    Albumin 2.6 (*)    Anion gap 18 (*)    All other components within normal limits  GLUCOSE, CAPILLARY - Abnormal; Notable for the following components:   Glucose-Capillary 108 (*)    All other components within normal limits  GLUCOSE, CAPILLARY - Abnormal; Notable for the following components:   Glucose-Capillary 128 (*)    All other components within normal limits  GLUCOSE, CAPILLARY - Abnormal; Notable for the following components:   Glucose-Capillary 142 (*)    All other components within normal limits  GLUCOSE, CAPILLARY - Abnormal; Notable for the following components:   Glucose-Capillary  132 (*)    All other components within normal limits  CBC - Abnormal; Notable for the following components:   RBC 3.74 (*)    Hemoglobin 10.2 (*)    HCT 32.2 (*)    RDW 18.5 (*)    All other components within normal limits  COMPREHENSIVE METABOLIC PANEL - Abnormal; Notable for the following components:   Potassium 3.4 (*)    CO2 21 (*)    Glucose, Bld 131 (*)    Total Protein 5.9 (*)    Albumin 2.8 (*)    Anion gap 17 (*)    All other components within normal limits  GLUCOSE, CAPILLARY - Abnormal; Notable for the following components:   Glucose-Capillary 127 (*)    All other components within normal limits  GLUCOSE, CAPILLARY - Abnormal; Notable for the following components:   Glucose-Capillary 101 (*)    All other components within normal limits  BASIC METABOLIC PANEL - Abnormal; Notable for the following components:   Glucose, Bld 105 (*)    BUN 6 (*)    All other components within normal limits  CBC - Abnormal; Notable for the following components:   RBC 3.54 (*)    Hemoglobin 9.6 (*)    HCT 31.2 (*)    RDW 18.8 (*)    All other components within normal limits  GLUCOSE, CAPILLARY - Abnormal; Notable for the following components:   Glucose-Capillary 109 (*)    All other components within normal limits  GLUCOSE, CAPILLARY - Abnormal; Notable for the following components:   Glucose-Capillary 103 (*)    All other components within normal limits  I-STAT CHEM 8, ED - Abnormal; Notable for the following components:   Creatinine, Ser 1.20 (*)    Glucose, Bld 178 (*)    Calcium, Ion 0.98 (*)    TCO2 18 (*)    Hemoglobin 10.5 (*)    HCT 31.0 (*)    All other components within normal limits    Past Medical History:  Diagnosis Date   Alcohol abuse    CAP (community acquired pneumonia) 05/05/2018   Carotid artery stenosis    Depression    Diabetes mellitus    Diverticulitis    Essential hypertension 08/20/2007   Qualifier: Diagnosis of   By: Jonny Ruiz MD, Len Blalock         Fibromyalgia    Hepatitis 08/28/2014   Hyperlipidemia  08/20/2007   Qualifier: Diagnosis of   By: Jonny Ruiz MD, Len Blalock        Hypertension    Myalgia and myositis 03/28/2012    Surgical History:  She  has a past surgical history that includes Cesarean section; Tonsillectomy; Bunionectomy; CAROTID PTA/STENT INTERVENTION (Left, 08/07/2020); Colonoscopy; Colonoscopy with esophagogastroduodenoscopy (egd); Esophagogastroduodenoscopy (egd) with propofol (N/A, 02/18/2021); Colonoscopy with propofol (N/A, 02/18/2021); IR ANGIO INTRA EXTRACRAN SEL COM CAROTID INNOMINATE BILAT MOD SED (09/09/2023); and IR ANGIO VERTEBRAL SEL VERTEBRAL BILAT MOD SED (09/09/2023). Family History:  Her family history includes Cancer in her mother; Heart disease in her father and sister. Social History:   reports that she has been smoking cigarettes and e-cigarettes. She has never used smokeless tobacco. She reports that she does not currently use alcohol. She reports that she does not use drugs.  Prior to Admission medications   Medication Sig Start Date End Date Taking? Authorizing Provider  amphetamine-dextroamphetamine (ADDERALL XR) 20 MG 24 hr capsule Take 40 mg by mouth every morning. 06/29/23  Yes [provider]  aspirin EC 81 MG EC tablet Take 1 tablet (81 mg total) by mouth daily. Swallow whole. 08/08/20  Yes Stegmayer, Cala Bradford A, PA-C  B Complex Vitamins (B COMPLEX-B12) TABS Take 1 tablet by mouth at bedtime.   Yes [provider]  BYSTOLIC 5 MG tablet TAKE 1 TABLET DAILY Patient taking differently: Take 5 mg by mouth daily. 09/26/15  Yes Carlus Pavlov, MD  clopidogrel (PLAVIX) 75 MG tablet TAKE 1 TABLET BY MOUTH EVERY DAY Patient taking differently: Take 75 mg by mouth daily. 07/18/23  Yes Georgiana Spinner, NP  diphenoxylate-atropine (LOMOTIL) 2.5-0.025 MG tablet Take 1 tablet by mouth 4 (four) times daily as needed for diarrhea or loose stools.  07/08/10  Yes [provider]  Dulaglutide  (TRULICITY) 3 MG/0.5ML SOPN Inject 3 mg into the skin once a week. 06/23/23  Yes   empagliflozin (JARDIANCE) 25 MG TABS tablet Take 25 mg by mouth daily.   Yes [provider]  EPINEPHrine 0.3 mg/0.3 mL IJ SOAJ injection Inject 0.3 mg into the muscle as needed for anaphylaxis.   Yes [provider]  lisinopril (PRINIVIL,ZESTRIL) 5 MG tablet TAKE 1 TABLET BY MOUTH EVERY DAY Patient taking differently: Take 5 mg by mouth daily. 07/14/15  Yes Carlus Pavlov, MD  metFORMIN (GLUCOPHAGE) 1000 MG tablet Take 1,000 mg by mouth every evening. 10/19/21  Yes [provider]  omeprazole (PRILOSEC) 40 MG capsule Take 40 mg by mouth daily. 08/30/18  Yes [provider]  pregabalin (LYRICA) 300 MG capsule Take 300 mg by mouth 2 (two) times daily. 08/05/20  Yes [provider]  glucose blood (BAYER CONTOUR NEXT TEST) test strip Use to test blood sugar 3 times daily as instructed. Dx code: E11.65 02/10/15   Carlus Pavlov, MD  Lancets (ACCU-CHEK MULTICLIX) lancets Use to test blood sugar 3 times daily as instructed. Dx: E11.65 02/10/15   Carlus Pavlov, MD    Current Facility-Administered Medications  Medication Dose Route Frequency Provider Last Rate Last Admin   ALPRAZolam Prudy Feeler) tablet 0.5 mg  0.5 mg Oral TID PRN [name], MD   0.5 mg at 09/13/23 1107   aspirin chewable tablet 81 mg  81 mg Oral Daily Kennieth Francois, PA   81 mg at 09/13/23 1058   enoxaparin (LOVENOX) injection 40 mg  40 mg Subcutaneous Q24H [name], MD   40 mg at 09/12/23 1503   hydrALAZINE (APRESOLINE) injection  5 mg  5 mg Intravenous Q6H PRN John Giovanni, MD       HYDROmorphone (DILAUDID) injection 1 mg  1 mg Intravenous Q3H PRN Skip Mayer A, MD   1 mg at 09/13/23 0921   insulin aspart (novoLOG) injection 0-9 Units  0-9 Units Subcutaneous TID WC [name], MD   1 Units at 09/12/23 0738   iohexol (OMNIPAQUE) 300 MG/ML solution 100 mL  100 mL Intra-arterial Once PRN  Julieanne Cotton, MD       lactated ringers infusion   Intravenous Continuous [name], MD 75 mL/hr at 09/13/23 1105 New Bag at 09/13/23 1105   lisinopril (ZESTRIL) tablet 5 mg  5 mg Oral Daily [name], MD   5 mg at 09/13/23 1058   loperamide (IMODIUM) capsule 2 mg  2 mg Oral PRN [name], MD   2 mg at 09/12/23 1506   morphine (PF) 2 MG/ML injection 1 mg  1 mg Intravenous Q4H PRN Lurline Del, MD   1 mg at 09/11/23 0136   mupirocin ointment (BACTROBAN) 2 % 1 Application  1 Application Nasal BID Lurline Del, MD   1 Application at 09/13/23 1058   nebivolol (BYSTOLIC) tablet 5 mg  5 mg Oral Daily [name], MD   5 mg at 09/13/23 1058   ondansetron (ZOFRAN) injection 4 mg  4 mg Intravenous Q6H PRN [name], MD   4 mg at 09/12/23 1610   Oral care mouth rinse  15 mL Mouth Rinse PRN [name], MD       piperacillin-tazobactam (ZOSYN) IVPB 3.375 g  3.375 g Intravenous Q8H Jacklynn Barnacle, RPH 12.5 mL/hr at 09/13/23 0658 3.375 g at 09/13/23 9604   rosuvastatin (CRESTOR) tablet 20 mg  20 mg Oral Daily Marvel Plan, MD   20 mg at 09/13/23 1058   ticagrelor (BRILINTA) tablet 90 mg  90 mg Oral BID Kennieth Francois, PA   90 mg at 09/13/23 1057    Allergies as of 09/09/2023 - Review Complete 09/09/2023  Allergen Reaction Noted   Diflucan [fluconazole] Nausea And Vomiting 09/09/2023   Latex Hives 11/10/2011   Lipitor [atorvastatin calcium] Other (See Comments) 03/12/2012   Codeine Rash 08/20/2007   Pentazocine lactate Rash 08/20/2007   Sulfonamide derivatives Rash 08/20/2007    Review of Systems:    Constitutional: No weight loss, fever, chills, weakness or fatigue HEENT: Eyes: No change in vision               Ears, Nose, Throat:  No change in hearing or congestion Skin: No rash or itching Cardiovascular: No chest pain, chest pressure or palpitations   Respiratory: No SOB or cough Gastrointestinal: See HPI and otherwise negative Genitourinary: No  dysuria or change in urinary frequency Neurological: No headache, dizziness or syncope Musculoskeletal: No new muscle or joint pain Hematologic: No bleeding or bruising Psychiatric: No history of depression or anxiety     Physical Exam:  Vital signs in last 24 hours: Temp:  [97.9 F (36.6 C)-98.6 F (37 C)] 98.6 F (37 C) (10/01 1115) Pulse Rate:  [56-79] 56 (10/01 1115) Resp:  [18] 18 (10/01 1115) BP: (151-177)/(57-74) 166/61 (10/01 1115) SpO2:  [95 %-100 %] 98 % (10/01 1115) Last BM Date : 09/12/23 Last BM recorded by nurses in past 5 days Stool Type: Type 7 (Liquid consistency with no solid pieces) (09/13/2023  2:54 AM)  General:   Pleasant, well developed female in no  acute distress Head:  Normocephalic and atraumatic. Eyes: sclerae anicteric,conjunctive pink  Heart:  regular rate and rhythm, no murmurs or gallops Pulm: Clear anteriorly; no wheezing Abdomen:  Soft, Flat AB, Active bowel sounds. mild tenderness RLQ. Without guarding and Without rebound,  Extremities:  Without edema. Msk:  Symmetrical without gross deformities. Peripheral pulses intact.  Neurologic:  Alert and  oriented x4;  No focal deficits.  Skin:   Dry and intact without significant lesions or rashes. Psychiatric:  Cooperative. Normal mood and affect.  LAB RESULTS: Recent Labs    09/11/23 1134 09/12/23 0746 09/13/23 0623  WBC 5.6 5.0 4.3  HGB 9.3* 10.2* 9.6*  HCT 29.6* 32.2* 31.2*  PLT 285 309 295   BMET Recent Labs    09/11/23 1134 09/12/23 0746 09/13/23 0623  NA 137 139 138  K 3.9 3.4* 4.5  CL 97* 101 107  CO2 22 21* 25  GLUCOSE 126* 131* 105*  BUN 8 8 6*  CREATININE 0.86 0.81 0.86  CALCIUM 8.7* 9.0 8.9   LFT Recent Labs    09/12/23 0746  PROT 5.9*  ALBUMIN 2.8*  AST 17  ALT 18  ALKPHOS 71  BILITOT 0.8   PT/INR No results for input(s): "LABPROT", "INR" in the last 72 hours.  STUDIES: No results found.    Impression    CT abdomen pelvis with contrast mild wall  thickening at ileocecal junction proximal ascending colon due to to distal ileus/colitis IBD/ischemia History of chronic diarrhea, previously negative fecal calprotectin 2021 when she also had thickening mid ascending colon Subsequent colonoscopy 2022 with Dr. Norma Fredrickson showed fair prep, polyps removed, diverticulosis, internal hemorrhoids, no inflammation seen, no random biopsies were performed-prep was very poor Recent CTA 08/07/2023 with profuse rectal bleeding with hemorrhagic shock was negative for bleeding and showed no evidence of bowel wall thickening or inflammatory changes.  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 difficile colitis -Will check C. difficile, treat if positive -With recent surgery, GLP-1 use, question ischemic colitis but currently no associated hematochezia -Some concern for IBD but has had CTs in the past most recently 8/25 without inflammation, colonoscopy 2022 with no obvious inflammation, no random biopsies performed. -Previously negative celiac, fecal calprotectin and pancreatic elastase -Chronic diarrhea sounds like it could be IBS related with lower abdominal discomfort better after a bowel movement, significant improvement with Lomotil. -Can consider colonoscopy if C. difficile negative to evaluate for ischemic colitis/inflammatory bowel disease/microscopic colitis/SCAD, especially in setting of chronic intermittent diarrhea -Patient currently admitted for TIA/has basilar artery stenosis, scheduled 10/7 for stent with IR outpatient.   Patient was switched from Plavix to Brilinta, last dose today.. -Will discuss with Dr. Rhea Belton if Cdiff negative and if need to consider pursuing colonoscopy inpatient or outpatient.,  Patient prefers outpatient if possible. -Can consider trial of Xifaxan/SIBO testing  Further recommendations per Dr. Rhea Belton Thank you for your kind consultation, we will continue to follow.   Doree Albee  09/13/2023, 2:04 PM

## 2023-09-13 NOTE — Plan of Care (Signed)

## 2023-09-13 NOTE — Progress Notes (Signed)
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    Associated significant intracranial arteriosclerotic related stenosis at the origins of the superior cerebellar arteries.     Suggestion of retrograde opacification of the right posterior cerebral artery P2 segments and distally from the left posterior communicating artery.      Approximately 50% stenosis of the proximal right internal carotid artery secondary to atherosclerotic disease. PLAN: Findings reviewed with patient and the patient's referring neurologist. Option of endovascular revascularization of severe high-grade probably symptomatic stenosis of the distal basilar artery to be discussed with the patient.  Dr Drusilla Kanner has seen and examined pt today Discussed with her and husband at bedside They DO want to move forward with angioplasty/stent placement of Basilar artery stenosis  Will be planned for Monday 09/19/23 in [facility] Needs to finish antibiotic course (Fri) Will need UA result Will need BC result  Dr Sharl Ma aware of plan and will be arranging all these things     Allergies: Diflucan [fluconazole], Latex, Lipitor [atorvastatin calcium], Codeine, Pentazocine lactate, and Sulfonamide derivatives  Medications: Prior to Admission medications   Medication Sig Start Date End Date Taking? Authorizing Provider  amphetamine-dextroamphetamine (ADDERALL XR) 20 MG 24 hr capsule Take 40 mg by mouth every morning. 06/29/23  Yes [provider]  aspirin EC 81 MG EC tablet Take 1 tablet (81 mg total) by mouth daily. Swallow whole. 08/08/20  Yes Stegmayer, Cala Bradford A, PA-C   B Complex Vitamins (B COMPLEX-B12) TABS Take 1 tablet by mouth at bedtime.   Yes [provider]  BYSTOLIC 5 MG tablet TAKE 1 TABLET DAILY Patient taking differently: Take 5 mg by mouth daily. 09/26/15  Yes Carlus Pavlov, MD  clopidogrel (PLAVIX) 75 MG tablet TAKE 1 TABLET BY MOUTH EVERY DAY Patient taking differently: Take 75 mg by mouth daily. 07/18/23  Yes Georgiana Spinner, NP  diphenoxylate-atropine (LOMOTIL) 2.5-0.025 MG tablet Take 1 tablet by mouth 4 (four) times daily as needed for diarrhea or loose stools.  07/08/10  Yes [provider]  Dulaglutide (TRULICITY) 3 MG/0.5ML SOPN Inject 3 mg into the skin once a week. 06/23/23  Yes   empagliflozin (JARDIANCE) 25 MG TABS tablet Take 25 mg by mouth daily.   Yes [provider]  EPINEPHrine 0.3 mg/0.3 mL IJ SOAJ injection Inject 0.3 mg into the muscle as needed for anaphylaxis.   Yes [provider]  lisinopril (PRINIVIL,ZESTRIL) 5 MG tablet TAKE 1 TABLET BY MOUTH EVERY DAY Patient taking differently: Take 5 mg by mouth daily. 07/14/15  Yes Carlus Pavlov, MD  metFORMIN (GLUCOPHAGE) 1000 MG tablet Take 1,000 mg by mouth every evening. 10/19/21  Yes [provider]  omeprazole (PRILOSEC) 40 MG capsule Take 40 mg by mouth daily. 08/30/18  Yes [provider]  pregabalin (LYRICA) 300 MG capsule Take 300 mg by mouth 2 (two) times daily. 08/05/20  Yes [provider]  glucose blood (BAYER CONTOUR NEXT TEST) test strip Use to test blood sugar 3 times daily as instructed. Dx code: E11.65 02/10/15   Carlus Pavlov, MD  Lancets (ACCU-CHEK MULTICLIX) lancets Use to test blood sugar 3 times daily as instructed. Dx: E11.65 02/10/15   Carlus Pavlov, MD  Vital Signs: BP (!) 165/72 (BP Location: Right Arm)   Pulse 79   Temp 98.6 F (37 C) (Oral)   Resp 18   Ht 5\' 2"  (1.575 m)   Wt 145 lb (65.8 kg)   SpO2 99%   BMI 26.52 kg/m   Physical Exam Vitals reviewed.  HENT:      Mouth/Throat:     Mouth: Mucous membranes are moist.  Eyes:     Extraocular Movements: Extraocular movements intact.  Cardiovascular:     Rate and Rhythm: Normal rate and regular rhythm.     Heart sounds: Normal heart sounds.  Pulmonary:     Effort: Pulmonary effort is normal.     Breath sounds: Normal breath sounds. No wheezing.  Abdominal:     Palpations: Abdomen is soft.     Tenderness: There is no abdominal tenderness.  Musculoskeletal:        General: Normal range of motion.  Skin:    General: Skin is warm.  Neurological:     Mental Status: She is alert and oriented to person, place, and time.  Psychiatric:        Behavior: Behavior normal.     Imaging: IR ANGIO VERTEBRAL SEL VERTEBRAL BILAT MOD SED  Result Date: 09/13/2023 CLINICAL DATA:  History of nausea, vomiting and mild right sided numbness leg > arm, associated with mental status changes. CT angiogram and MRA revealing questionable progression of distal basilar artery proximal right PCA stenosis. EXAM: BILATERAL COMMON CAROTID AND INNOMINATE ANGIOGRAPHY COMPARISON:  Diagnostic catheter arteriogram of January 03, 2012; MRA MRI of the brain of September 09, 2023. MEDICATIONS: Heparin 1000 units IV. No antibiotic was administered within 1 hour of the procedure. ANESTHESIA/SEDATION: Versed 2 mg mg IV; Fentanyl 50 mcg IV Moderate Sedation Time:  33 minutes The patient was continuously monitored during the procedure by the interventional radiology nurse under my direct supervision. CONTRAST:  Omnipaque 300 approximately 65 mL. FLUOROSCOPY TIME:  Fluoroscopy Time: 9 minutes 48 seconds (421 mGy). COMPLICATIONS: None immediate. TECHNIQUE: Informed written consent was obtained from the patient after a thorough discussion of the procedural risks, benefits and alternatives. All questions were addressed. Maximal Sterile Barrier Technique was utilized including caps, mask, sterile gowns, sterile gloves, sterile drape, hand hygiene and skin  antiseptic. A timeout was performed prior to the initiation of the procedure. The right groin was prepped and draped in the usual sterile fashion. Thereafter using modified Seldinger technique, transfemoral access into the right common femoral artery was obtained without difficulty. Over an 0.035 inch guidewire, a 5 French Pinnacle sheath was inserted. Through this, and also over an 0.035 inch guidewire, a 5 [place] JB 1 catheter was advanced to the aortic arch region and selectively positioned in the right vertebral artery, the right common carotid artery, the left common carotid artery and the left subclavian artery. FINDINGS: The innominate arteriogram demonstrates the proximal right subclavian artery and the right common carotid artery to be widely patent. The dominant right vertebral artery origin is widely patent. The vessel opacifies to the cranial skull base. Patency is seen of the right vertebrobasilar junction and the right posterior-inferior cerebellar artery. The proximal 2/3 of the basilar artery is widely patent. Patency is also seen of the anterior-inferior cerebellar arteries bilaterally, with suggestion of a right anterior-inferior cerebellar artery posterior-inferior cerebellar artery complex, a developmental variation. More distally, there is severe stenosis of the distal basilar artery at the level of the superior cerebellar arteries, extending distally into the right posterior cerebral  artery P1 segment. Severe stenosis is also evident of the left superior cerebellar arteries at their origins. The right common carotid arteriogram demonstrates mild stenosis of the proximal right external carotid artery. Its branches opacify widely. The right internal carotid artery at the bulb has approximately 50% stenosis by the NASCET criteria with no evidence of intraluminal filling defects or ulcerations. More distally, the vessel is seen to opacify to the cranial skull base. There is moderate stenosis of the  petrous cavernous junction. More distally, the distal cavernous and the supraclinoid right ICA are widely patent. The right middle cerebral artery demonstrates a 50% stenosis at its distal M1 segment. The MCA bifurcation branches are patent with scattered areas of mild atherosclerotic changes. The right anterior cerebral artery A1 A2 segments is widely patent with transient opacification via the anterior communicating artery of the left anterior cerebral artery A2 segment and distally. A small focal outpouching is seen projecting inferiorly and slightly laterally from the anterior communicating artery, which may represent a small vascular loop versus a 1 mm aneurysm. The left common carotid arteriogram demonstrates a mild stenosis at the origin of the left external carotid artery. Its branches opacify widely. The left internal carotid artery at the bulb and proximal 1/3 again demonstrates a widely patent stented segment with suggestion of minimal neointimal hyperplasia. More distally, the left internal carotid artery opacifies to the cranial skull base. The petrous, the cavernous and the supraclinoid ICA are widely patent. The left posterior communicating artery is seen opacifying the left posterior cerebral artery distribution with a mild-to-moderate stenosis. The left middle cerebral artery and the hypoplastic left anterior cerebral artery opacify into the capillary and venous phases. Also demonstrated is delayed opacification of the right posterior cerebral artery P2 segment and distally from the left posterior communicating artery. The left subclavian arteriogram demonstrates mild narrowing at the origin of the hypoplastic left vertebral artery. The vessel, otherwise, is seen to opacify to the cranial skull base where it terminates primarily in the ipsilateral left posterior-inferior cerebellar artery. 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 Associated significant intracranial arteriosclerotic related stenosis at the origins of the superior cerebellar arteries. Suggestion of retrograde opacification of the right posterior cerebral artery P2 segments and distally from the left posterior communicating artery. Approximately 50% stenosis of the proximal right internal carotid artery secondary to atherosclerotic disease. PLAN: Findings reviewed with patient and the patient's referring neurologist. Option of endovascular revascularization of severe high-grade probably symptomatic stenosis of the distal basilar artery to be discussed with the patient. Electronically Signed   By: Julieanne Cotton M.D.   On: 09/13/2023 08:16   IR ANGIO INTRA EXTRACRAN SEL COM CAROTID INNOMINATE BILAT MOD SED  Result Date: 09/13/2023 CLINICAL DATA:  History of nausea, vomiting and mild right sided numbness leg > arm, associated with mental status changes. CT angiogram and MRA revealing questionable progression of distal basilar artery proximal right PCA stenosis. EXAM: BILATERAL COMMON CAROTID AND INNOMINATE ANGIOGRAPHY COMPARISON:  Diagnostic catheter arteriogram of January 03, 2012; MRA MRI of the brain of September 09, 2023. MEDICATIONS: Heparin 1000 units IV. No antibiotic was administered within 1 hour of the procedure. ANESTHESIA/SEDATION: Versed 2 mg mg IV; Fentanyl 50 mcg IV Moderate Sedation Time:  33 minutes The patient was continuously monitored during the procedure by the interventional radiology nurse under my direct supervision. CONTRAST:  Omnipaque 300 approximately 65 mL. FLUOROSCOPY  TIME:  Fluoroscopy Time: 9 minutes 48 seconds (421 mGy). COMPLICATIONS: None immediate. TECHNIQUE: Informed written consent was obtained from the patient after a thorough discussion of the procedural risks, benefits and alternatives. All questions were addressed. Maximal Sterile Barrier  Technique was utilized including caps, mask, sterile gowns, sterile gloves, sterile drape, hand hygiene and skin antiseptic. A timeout was performed prior to the initiation of the procedure. The right groin was prepped and draped in the usual sterile fashion. Thereafter using modified Seldinger technique, transfemoral access into the right common femoral artery was obtained without difficulty. Over an 0.035 inch guidewire, a 5 French Pinnacle sheath was inserted. Through this, and also over an 0.035 inch guidewire, a 5 [place] JB 1 catheter was advanced to the aortic arch region and selectively positioned in the right vertebral artery, the right common carotid artery, the left common carotid artery and the left subclavian artery. FINDINGS: The innominate arteriogram demonstrates the proximal right subclavian artery and the right common carotid artery to be widely patent. The dominant right vertebral artery origin is widely patent. The vessel opacifies to the cranial skull base. Patency is seen of the right vertebrobasilar junction and the right posterior-inferior cerebellar artery. The proximal 2/3 of the basilar artery is widely patent. Patency is also seen of the anterior-inferior cerebellar arteries bilaterally, with suggestion of a right anterior-inferior cerebellar artery posterior-inferior cerebellar artery complex, a developmental variation. More distally, there is severe stenosis of the distal basilar artery at the level of the superior cerebellar arteries, extending distally into the right posterior cerebral artery P1 segment. Severe stenosis is also evident of the left superior cerebellar arteries at their origins. The right common carotid arteriogram demonstrates mild stenosis of the proximal right external carotid artery. Its branches opacify widely. The right internal carotid artery at the bulb has approximately 50% stenosis by the NASCET criteria with no evidence of intraluminal filling defects or  ulcerations. More distally, the vessel is seen to opacify to the cranial skull base. There is moderate stenosis of the petrous cavernous junction. More distally, the distal cavernous and the supraclinoid right ICA are widely patent. The right middle cerebral artery demonstrates a 50% stenosis at its distal M1 segment. The MCA bifurcation branches are patent with scattered areas of mild atherosclerotic changes. The right anterior cerebral artery A1 A2 segments is widely patent with transient opacification via the anterior communicating artery of the left anterior cerebral artery A2 segment and distally. A small focal outpouching is seen projecting inferiorly and slightly laterally from the anterior communicating artery, which may represent a small vascular loop versus a 1 mm aneurysm. The left common carotid arteriogram demonstrates a mild stenosis at the origin of the left external carotid artery. Its branches opacify widely. The left internal carotid artery at the bulb and proximal 1/3 again demonstrates a widely patent stented segment with suggestion of minimal neointimal hyperplasia. More distally, the left internal carotid artery opacifies to the cranial skull base. The petrous, the cavernous and the supraclinoid ICA are widely patent. The left posterior communicating artery is seen opacifying the left posterior cerebral artery distribution with a mild-to-moderate stenosis. The left middle cerebral artery and the hypoplastic left anterior cerebral artery opacify into the capillary and venous phases. Also demonstrated is delayed opacification of the right posterior cerebral artery P2 segment and distally from the left posterior communicating artery. The left subclavian arteriogram demonstrates mild narrowing at the origin of the hypoplastic left vertebral artery. The vessel, otherwise, is seen to opacify to  the cranial skull base where it terminates primarily in the ipsilateral left posterior-inferior cerebellar  artery. 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 Associated significant intracranial arteriosclerotic related stenosis at the origins of the superior cerebellar arteries. Suggestion of retrograde opacification of the right posterior cerebral artery P2 segments and distally from the left posterior communicating artery. Approximately 50% stenosis of the proximal right internal carotid artery secondary to atherosclerotic disease. PLAN: Findings reviewed with patient and the patient's referring neurologist. Option of endovascular revascularization of severe high-grade probably symptomatic stenosis of the distal basilar artery to be discussed with the patient. Electronically Signed   By: Julieanne Cotton M.D.   On: 09/13/2023 08:16   ECHOCARDIOGRAM COMPLETE BUBBLE STUDY  Result Date: 09/10/2023    ECHOCARDIOGRAM REPORT   Patient Name:   [name] Date of Exam: 09/10/2023 Medical Rec #:  [id number]       Height:       62.0 in Accession #:    [id number]      Weight:       145.0 lb Date of Birth:  01/16/1946       BSA:          1.667 m Patient Age:    77 years        BP:           114/49 mmHg Patient Gender: F               HR:           58 bpm. Exam Location:  Inpatient Procedure: 2D Echo, Color Doppler, Cardiac Doppler and Saline Contrast Bubble            Study Indications:    Stroke  History:        Patient has prior history of Echocardiogram examinations, most                 recent 11/11/2011. Stroke, TIA and PAD; Risk                 Factors:Hypertension, Diabetes and Dyslipidemia.  Sonographer:    Milbert Coulter Referring Phys: [id number] CORTNEY E DE LA TORRE IMPRESSIONS  1. Left ventricular ejection fraction, by estimation, is 60 to 65%. The left ventricle has normal function. The left ventricle has no regional wall motion abnormalities. There is mild  left ventricular hypertrophy. Left ventricular diastolic parameters are consistent with Grade I diastolic dysfunction (impaired relaxation).  2. Right ventricular systolic function is normal. The right ventricular size is normal. Tricuspid regurgitation signal is inadequate for assessing PA pressure.  3. Resolution is challenging for bubble study; possible small PFO with a few bubble within 3-5 cardiac cycles.  4. The mitral valve is normal in structure. No evidence of mitral valve regurgitation.  5. The aortic valve is normal in structure. Aortic valve regurgitation is not visualized.  6. The inferior vena cava is normal in size with greater than 50% respiratory variability, suggesting right atrial pressure of 3 mmHg. Conclusion(s)/Recommendation(s): Possible small PFO; considering his age and comorbidites stroke etiology is multifactorial. Would not w/u for PFO closure. FINDINGS  Left Ventricle: Left ventricular ejection fraction, by estimation, is 60 to 65%. The left ventricle has normal function. The left ventricle has no regional wall motion abnormalities. The left ventricular internal cavity size was normal in size. There is  mild left ventricular hypertrophy. Left ventricular diastolic  parameters are consistent with Grade I diastolic dysfunction (impaired relaxation). Right Ventricle: The right ventricular size is normal. Right ventricular systolic function is normal. Tricuspid regurgitation signal is inadequate for assessing PA pressure. Left Atrium: Left atrial size was normal in size. Right Atrium: Right atrial size was normal in size. Pericardium: There is no evidence of pericardial effusion. Mitral Valve: The mitral valve is normal in structure. No evidence of mitral valve regurgitation. Tricuspid Valve: Tricuspid valve regurgitation is not demonstrated. Aortic Valve: The aortic valve is normal in structure. Aortic valve regurgitation is not visualized. Aortic valve mean gradient measures 4.0 mmHg.  Aortic valve peak gradient measures 7.6 mmHg. Aortic valve area, by VTI measures 2.14 cm. Pulmonic Valve: Pulmonic valve regurgitation is not visualized. Aorta: The aortic root is normal in size and structure. Venous: The inferior vena cava is normal in size with greater than 50% respiratory variability, suggesting right atrial pressure of 3 mmHg. IAS/Shunts: No atrial level shunt detected by color flow Doppler. Agitated saline contrast was given intravenously to evaluate for intracardiac shunting.  LEFT VENTRICLE PLAX 2D LVIDd:         4.20 cm   Diastology LVIDs:         2.90 cm   LV e' medial:    7.51 cm/s LV PW:         1.00 cm   LV E/e' medial:  6.8 LV IVS:        1.10 cm   LV e' lateral:   11.40 cm/s LVOT diam:     2.10 cm   LV E/e' lateral: 4.5 LV SV:         70 LV SV Index:   42 LVOT Area:     3.46 cm  RIGHT VENTRICLE RV Basal diam:  2.80 cm RV Mid diam:    2.40 cm RV S prime:     20.80 cm/s TAPSE (M-mode): 2.5 cm LEFT ATRIUM             Index        RIGHT ATRIUM           Index LA diam:        3.90 cm 2.34 cm/m   RA Area:     10.60 cm LA Vol (A2C):   35.8 ml 21.47 ml/m  RA Volume:   20.40 ml  12.23 ml/m LA Vol (A4C):   40.0 ml 23.99 ml/m LA Biplane Vol: 39.7 ml 23.81 ml/m  AORTIC VALVE AV Area (Vmax):    2.25 cm AV Area (Vmean):   2.26 cm AV Area (VTI):     2.14 cm AV Vmax:           138.00 cm/s AV Vmean:          91.400 cm/s AV VTI:            0.325 m AV Peak Grad:      7.6 mmHg AV Mean Grad:      4.0 mmHg LVOT Vmax:         89.60 cm/s LVOT Vmean:        59.700 cm/s LVOT VTI:          0.201 m LVOT/AV VTI ratio: 0.62  AORTA Ao Root diam: 3.20 cm MITRAL VALVE MV Area (PHT): 2.12 cm    SHUNTS MV Decel Time: 357 msec    Systemic VTI:  0.20 m MV E velocity: 50.80 cm/s  Systemic Diam: 2.10 cm MV A velocity: 84.40 cm/s MV E/A ratio:  0.60 Carolan Clines Electronically signed by Carolan Clines Signature Date/Time: 09/10/2023/3:36:31 PM    Final    CT ABDOMEN PELVIS W CONTRAST  Result Date:  09/09/2023 CLINICAL DATA:  Abdomen pain EXAM: CT ABDOMEN AND PELVIS WITH CONTRAST TECHNIQUE: Multidetector CT imaging of the abdomen and pelvis was performed using the standard protocol following bolus administration of intravenous contrast. RADIATION DOSE REDUCTION: This exam was performed according to the departmental dose-optimization program which includes automated exposure control, adjustment of the mA and/or kV according to patient size and/or use of iterative reconstruction technique. CONTRAST:  75mL OMNIPAQUE IOHEXOL 350 MG/ML SOLN COMPARISON:  CT 09/09/2023, CT  angiography 08/08/2023, 08/07/2023 FINDINGS: Lower chest: Lung bases demonstrate no acute airspace disease. Hepatobiliary: Excreted contrast in the gallbladder. No biliary dilatation or focal hepatic abnormality Pancreas: Unremarkable. No pancreatic ductal dilatation or surrounding inflammatory changes. Spleen: Normal in size without focal abnormality. Adrenals/Urinary Tract: Adrenal glands are within normal limits. Cyst in the right kidney, no imaging follow-up is recommended. Excreted contrast within the renal collecting systems and bladder. Interim development of diffuse bladder wall thickening, perivesical stranding and gas within the wall of the bladder. Stomach/Bowel: The stomach is nonenlarged. No dilated small bowel. Suspicion of mild wall thickening and inflammation at the ileocecal junction and proximal ascending colon, series 3 image 47 through 55. Diverticular disease of the left colon. Vascular/Lymphatic: Moderate aortic atherosclerosis. No aneurysm. No suspicious lymph nodes. Reproductive: Uterus unremarkable. Hyperdense material/contrast within the vagina. No adnexal mass Other: No free air or pelvic effusion. Small foci of gas along the bilateral pelvic sidewall, suspect that these are intravascular. Musculoskeletal: No acute or suspicious osseous abnormality. IMPRESSION: 1. Interim development of diffuse bladder wall thickening,  perivesical stranding and gas within the wall of the bladder, consistent with emphysematous cystitis. 2. Suspicion of mild wall thickening and inflammation at the ileocecal junction and proximal ascending colon which may be due to distal ileitis/colitis from infection, inflammatory bowel disease, or ischemia. 3. Diverticular disease of the left colon without acute inflammatory change. 4. Hyperdense contrast within the vagina, no obvious fistula seen. Question related to vaginal reflux. Aortic Atherosclerosis (ICD10-I70.0) and Emphysema (ICD10-J43.9). These results will be called to the ordering clinician or representative by the Radiologist Assistant, and communication documented in the PACS or [employer]. Electronically Signed   By: Jasmine Pang M.D.   On: 09/09/2023 23:36    Labs:  CBC: Recent Labs    09/09/23 0539 09/09/23 0543 09/11/23 1134 09/12/23 0746 09/13/23 0623  WBC 13.9*  --  5.6 5.0 4.3  HGB 9.8* 10.5* 9.3* 10.2* 9.6*  HCT 33.0* 31.0* 29.6* 32.2* 31.2*  PLT 356  --  285 309 295    COAGS: Recent Labs    08/07/23 0944 09/09/23 0539  INR 0.9 1.0  APTT 26 22*    BMP: Recent Labs    09/09/23 0539 09/09/23 0543 09/11/23 1134 09/12/23 0746 09/13/23 0623  NA 137 135 137 139 138  K 4.4 4.6 3.9 3.4* 4.5  CL 102 106 97* 101 107  CO2 17*  --  22 21* 25  GLUCOSE 178* 178* 126* 131* 105*  BUN 23 23 8 8  6*  CALCIUM 8.5*  --  8.7* 9.0 8.9  CREATININE 1.17* 1.20* 0.86 0.81 0.86  GFRNONAA 49*  --  >60 >60 >60    LIVER FUNCTION TESTS: Recent Labs    08/08/23 0525 09/09/23 0539 09/11/23 1134 09/12/23 0746  BILITOT 0.6 0.3 0.5 0.8  AST 12* 20 17 17  ALT 12 14 13 18   ALKPHOS 37* 63 68 71  PROT 4.0* 5.4* 5.4* 5.9*  ALBUMIN 2.3* 3.0* 2.6* 2.8*    Assessment and Plan:  Scheduler aware of pt and is attempting to arrange anesthesia for Monday 10/7. We will tentatively plan for this day-- will hear back from anesthesia asap. Pt is aware  Electronically  Signed: Robet Leu, PA-C 09/13/2023, 10:04 AM   I spent a total of 25 Minutes at the the patient's bedside AND on the patient's hospital floor or unit, greater than 50% of which was counseling/coordinating care for basilar artery angioplasty/stent

## 2023-09-13 NOTE — Progress Notes (Signed)
   Anesthesia has been scheduled for Monday 10/7 in [facility] for basilar artery angioplasty/stent.

## 2023-09-13 NOTE — Consult Note (Signed)
Consult Note  CHE BELOW Jul 17, 1946  [id number].    Requesting MD: Dr. Sharl Ma Chief Complaint/Reason for Consult: abdominal pain, history of recent appendectomy  HPI:  77 y.o. female with medical history significant for T2DM, diverticulosis, recent GI bleed, HTN, anxiety, carotid artery stenosis on DAPT, colitis/ chronic diarrhea who presented to the ED via EMS on 9/27 with vomiting and right leg numbness. Symptoms had started the morning of presentation. Work up in the ED was significant for TIA/basilar artery occlusion and she was admitted to the [facility] service with IR consulting with plan for angioplasty/stent next week pending finishing abx and resolution of any infectious source.  Hospitalization has been significant for findings of leukocytosis, CT abd/pelvis with emphysematous cystitis (urology consulted) and thickening and inflammation at the ileocecal junction and proximal ascending colon. General surgery was asked to see regarding colonic inflammation with recent surgery.  Pertinent surgical/GI history includes an appendectomy in [facility] approximately 3 weeks ago. She also has history of diverticular bleeding while on DAPT resulting in hemorraghic shock and an ICU admission; she was admitted 8/25-8/29.  Since her appendectomy 3 weeks ago she has not felt well and had difficulty with PO intake. She initially had some n/v after surgery. That has resolved and now she just does not have any appetite. States that her abdomen is sore, but only has pain when palpated in the RLQ. Continues to have diarrhea but states this is a chronic issue for her. She take imodium PRN which helps.   Blood thinners: lovenox, brilinta Past Surgeries: c section, appendectomy    ROS: Reviewed and as above  Family History  Problem Relation Age of Onset   Cancer Mother    Heart disease Father    Heart disease Sister     Past Medical History:  Diagnosis Date   Alcohol abuse    CAP (community  acquired pneumonia) 05/05/2018   Carotid artery stenosis    Depression    Diabetes mellitus    Diverticulitis    Essential hypertension 08/20/2007   Qualifier: Diagnosis of   By: Jonny Ruiz MD, Len Blalock        Fibromyalgia    Hepatitis 08/28/2014   Hyperlipidemia 08/20/2007   Qualifier: Diagnosis of   By: Jonny Ruiz MD, Len Blalock        Hypertension    Myalgia and myositis 03/28/2012    Past Surgical History:  Procedure Laterality Date   BUNIONECTOMY     CAROTID PTA/STENT INTERVENTION Left 08/07/2020   Procedure: CAROTID PTA/STENT INTERVENTION;  Surgeon: Annice Needy, MD;  Location: [facility] INVASIVE CV LAB;  Service: Cardiovascular;  Laterality: Left;   CESAREAN SECTION     COLONOSCOPY     COLONOSCOPY WITH ESOPHAGOGASTRODUODENOSCOPY (EGD)     COLONOSCOPY WITH PROPOFOL N/A 02/18/2021   Procedure: COLONOSCOPY WITH PROPOFOL;  Surgeon: Toledo, Boykin Nearing, MD;  Location: [facility] ENDOSCOPY;  Service: Gastroenterology;  Laterality: N/A;   ESOPHAGOGASTRODUODENOSCOPY (EGD) WITH PROPOFOL N/A 02/18/2021   Procedure: ESOPHAGOGASTRODUODENOSCOPY (EGD) WITH PROPOFOL;  Surgeon: Toledo, Boykin Nearing, MD;  Location: [facility] ENDOSCOPY;  Service: Gastroenterology;  Laterality: N/A;   IR ANGIO INTRA EXTRACRAN SEL COM CAROTID INNOMINATE BILAT MOD SED  09/09/2023   IR ANGIO VERTEBRAL SEL VERTEBRAL BILAT MOD SED  09/09/2023   TONSILLECTOMY      Social History:  reports that she has been smoking cigarettes and e-cigarettes. She has never used smokeless tobacco. She reports that she does not currently use alcohol. She reports that she  does not use drugs.  Allergies:  Allergies  Allergen Reactions   Diflucan [Fluconazole] Nausea And Vomiting   Latex Hives   Lipitor [Atorvastatin Calcium] Other (See Comments)    Muscle aches   Codeine Rash    And nausea    Pentazocine Lactate Rash   Sulfonamide Derivatives Rash    Medications Prior to Admission  Medication Sig Dispense Refill   amphetamine-dextroamphetamine (ADDERALL XR) 20 MG  24 hr capsule Take 40 mg by mouth every morning.     aspirin EC 81 MG EC tablet Take 1 tablet (81 mg total) by mouth daily. Swallow whole. 90 tablet 3   B Complex Vitamins (B COMPLEX-B12) TABS Take 1 tablet by mouth at bedtime.     BYSTOLIC 5 MG tablet TAKE 1 TABLET DAILY (Patient taking differently: Take 5 mg by mouth daily.) 90 tablet 0   clopidogrel (PLAVIX) 75 MG tablet TAKE 1 TABLET BY MOUTH EVERY DAY (Patient taking differently: Take 75 mg by mouth daily.) 90 tablet 2   diphenoxylate-atropine (LOMOTIL) 2.5-0.025 MG tablet Take 1 tablet by mouth 4 (four) times daily as needed for diarrhea or loose stools.      Dulaglutide (TRULICITY) 3 MG/0.5ML SOPN Inject 3 mg into the skin once a week. 6 mL 1   empagliflozin (JARDIANCE) 25 MG TABS tablet Take 25 mg by mouth daily.     EPINEPHrine 0.3 mg/0.3 mL IJ SOAJ injection Inject 0.3 mg into the muscle as needed for anaphylaxis.     lisinopril (PRINIVIL,ZESTRIL) 5 MG tablet TAKE 1 TABLET BY MOUTH EVERY DAY (Patient taking differently: Take 5 mg by mouth daily.) 90 tablet 0   metFORMIN (GLUCOPHAGE) 1000 MG tablet Take 1,000 mg by mouth every evening.     omeprazole (PRILOSEC) 40 MG capsule Take 40 mg by mouth daily.  2   pregabalin (LYRICA) 300 MG capsule Take 300 mg by mouth 2 (two) times daily.     glucose blood (BAYER CONTOUR NEXT TEST) test strip Use to test blood sugar 3 times daily as instructed. Dx code: E11.65 100 each 11   Lancets (ACCU-CHEK MULTICLIX) lancets Use to test blood sugar 3 times daily as instructed. Dx: E11.65 100 each 11    Blood pressure (!) 166/61, pulse (!) 56, temperature 98.6 F (37 C), temperature source Oral, resp. rate 18, height 5\' 2"  (1.575 m), weight 65.8 kg, SpO2 98%. Physical Exam: General: pleasant, WD, female who is laying in bed in NAD HEENT: head is normocephalic, atraumatic.  Sclera are noninjected.  Pupils equal and round. EOMs intact.  Ears and nose without any masses or lesions.  Mouth is pink and  moist Heart: regular, rate, and rhythm Lungs: CTAB, no wheezes, rhonchi, or rales noted.  Respiratory effort nonlabored Abd: soft, ND, +BS, no masses, hernias, or organomegaly. Minimal RLQ TTP without rebound or guarding. Well healing laparoscopic incisions Skin: warm and dry with no masses, lesions, or rashes   Results for orders placed or performed during the hospital encounter of 09/09/23 (from the past 48 hour(s))  Glucose, capillary     Status: Abnormal   Collection Time: 09/11/23  3:37 PM  Result Value Ref Range   Glucose-Capillary 132 (H) 70 - 99 mg/dL    Comment: Glucose reference range applies only to samples taken after fasting for at least 8 hours.  Glucose, capillary     Status: Abnormal   Collection Time: 09/12/23  6:38 AM  Result Value Ref Range   Glucose-Capillary 127 (H) 70 - 99  mg/dL    Comment: Glucose reference range applies only to samples taken after fasting for at least 8 hours.  CBC     Status: Abnormal   Collection Time: 09/12/23  7:46 AM  Result Value Ref Range   WBC 5.0 4.0 - 10.5 K/uL   RBC 3.74 (L) 3.87 - 5.11 MIL/uL   Hemoglobin 10.2 (L) 12.0 - 15.0 g/dL   HCT 45.4 (L) 09.8 - 11.9 %   MCV 86.1 80.0 - 100.0 fL   MCH 27.3 26.0 - 34.0 pg   MCHC 31.7 30.0 - 36.0 g/dL   RDW 14.7 (H) 82.9 - 56.2 %   Platelets 309 150 - 400 K/uL   nRBC 0.0 0.0 - 0.2 %    Comment: Performed at [facility], [address] [place], [place] [address]  Comprehensive metabolic panel     Status: Abnormal   Collection Time: 09/12/23  7:46 AM  Result Value Ref Range   Sodium 139 135 - 145 mmol/L   Potassium 3.4 (L) 3.5 - 5.1 mmol/L   Chloride 101 98 - 111 mmol/L   CO2 21 (L) 22 - 32 mmol/L   Glucose, Bld 131 (H) 70 - 99 mg/dL    Comment: Glucose reference range applies only to samples taken after fasting for at least 8 hours.   BUN 8 8 - 23 mg/dL   Creatinine, Ser 5.78 0.44 - 1.00 mg/dL   Calcium 9.0 8.9 - 46.9 mg/dL   Total Protein 5.9 (L) 6.5 - 8.1 g/dL   Albumin  2.8 (L) 3.5 - 5.0 g/dL   AST 17 15 - 41 U/L   ALT 18 0 - 44 U/L   Alkaline Phosphatase 71 38 - 126 U/L   Total Bilirubin 0.8 0.3 - 1.2 mg/dL   GFR, Estimated >62 >95 mL/min    Comment: (NOTE) Calculated using the CKD-EPI Creatinine Equation (2021)    Anion gap 17 (H) 5 - 15    Comment: Performed at [facility], [address] [place], [place] [address]  Glucose, capillary     Status: Abnormal   Collection Time: 09/12/23 12:24 PM  Result Value Ref Range   Glucose-Capillary 101 (H) 70 - 99 mg/dL    Comment: Glucose reference range applies only to samples taken after fasting for at least 8 hours.  Glucose, capillary     Status: None   Collection Time: 09/12/23  4:46 PM  Result Value Ref Range   Glucose-Capillary 90 70 - 99 mg/dL    Comment: Glucose reference range applies only to samples taken after fasting for at least 8 hours.  Glucose, capillary     Status: Abnormal   Collection Time: 09/12/23  9:13 PM  Result Value Ref Range   Glucose-Capillary 109 (H) 70 - 99 mg/dL    Comment: Glucose reference range applies only to samples taken after fasting for at least 8 hours.   Comment 1 Notify RN    Comment 2 Document in Chart   Basic metabolic panel     Status: Abnormal   Collection Time: 09/13/23  6:23 AM  Result Value Ref Range   Sodium 138 135 - 145 mmol/L   Potassium 4.5 3.5 - 5.1 mmol/L   Chloride 107 98 - 111 mmol/L   CO2 25 22 - 32 mmol/L   Glucose, Bld 105 (H) 70 - 99 mg/dL    Comment: Glucose reference range applies only to samples taken after fasting for at least 8 hours.  BUN 6 (L) 8 - 23 mg/dL   Creatinine, Ser 1.61 0.44 - 1.00 mg/dL   Calcium 8.9 8.9 - 09.6 mg/dL   GFR, Estimated >04 >54 mL/min    Comment: (NOTE) Calculated using the CKD-EPI Creatinine Equation (2021)    Anion gap 6 5 - 15    Comment: Performed at [facility], [address] [place], [place] [address]  CBC     Status: Abnormal   Collection Time: 09/13/23  6:23 AM  Result Value  Ref Range   WBC 4.3 4.0 - 10.5 K/uL   RBC 3.54 (L) 3.87 - 5.11 MIL/uL   Hemoglobin 9.6 (L) 12.0 - 15.0 g/dL   HCT 91.4 (L) 78.2 - 95.6 %   MCV 88.1 80.0 - 100.0 fL   MCH 27.1 26.0 - 34.0 pg   MCHC 30.8 30.0 - 36.0 g/dL   RDW 21.3 (H) 08.6 - 57.8 %   Platelets 295 150 - 400 K/uL   nRBC 0.0 0.0 - 0.2 %    Comment: Performed at [facility], [address], [place], [place] [address]  Glucose, capillary     Status: Abnormal   Collection Time: 09/13/23  6:36 AM  Result Value Ref Range   Glucose-Capillary 103 (H) 70 - 99 mg/dL    Comment: Glucose reference range applies only to samples taken after fasting for at least 8 hours.   Comment 1 Notify RN    Comment 2 Document in Chart   Glucose, capillary     Status: None   Collection Time: 09/13/23 11:14 AM  Result Value Ref Range   Glucose-Capillary 95 70 - 99 mg/dL    Comment: Glucose reference range applies only to samples taken after fasting for at least 8 hours.   No results found.    Assessment/Plan Abdominal pain Inflammation of ileocecal junction/ascending colon Approx 3 weeks post op appendectomy - Patient seen and examined and relevant labs and imaging reviewed. CT abd/pelvis findings as above in setting of recent appendectomy as well as longstanding colitis (followed by Dr. Norma Fredrickson at [facility], last colonoscopy 02/2021). I do not see any major postop complication or abscess on CT scan. On exam she has minimal expected abdominal tenderness to palpation. She is hemodynamically stable (has been hypertensive) with resolved leukocytosis on zosyn for cystitis. Blood cultures pending. She is not eating much but has been tolerating a diet. Continues to have some diarrhea which she states is her baseline and responds to imodium. No emergent surgical intervention is indicated. Given CT findings and h/o colitis recommend reviewing with gastroenterology. Could consider stool studies. This was discussed with the primary  team.   FEN: heart healthy ID: zosyn VTE: Lovenox, brillinta   Per primary: TIA of posterior circulation- on brilinta. Angioplasty/stent next week by IR T2DM Emphysematous cystitis Recent GI bleed HTN anxiety   I reviewed hospitalist notes, last 24 h vitals and pain scores, last 48 h intake and output, last 24 h labs and trends, and last 24 h imaging results.   Carlena Bjornstad, PA-C [facility] 09/13/2023, 12:16 PM Please see Amion for pager number during day hours 7:00am-4:30pm

## 2023-09-14 ENCOUNTER — Other Ambulatory Visit (HOSPITAL_COMMUNITY): Payer: Self-pay | Admitting: Student

## 2023-09-14 ENCOUNTER — Telehealth (INDEPENDENT_AMBULATORY_CARE_PROVIDER_SITE_OTHER): Payer: Self-pay | Admitting: Nurse Practitioner

## 2023-09-14 DIAGNOSIS — G459 Transient cerebral ischemic attack, unspecified: Secondary | ICD-10-CM | POA: Diagnosis not present

## 2023-09-14 LAB — GASTROINTESTINAL PANEL BY PCR, STOOL (REPLACES STOOL CULTURE)

## 2023-09-14 LAB — COMPREHENSIVE METABOLIC PANEL
ALT: 26 U/L (ref 0–44)
AST: 33 U/L (ref 15–41)
Albumin: 3 g/dL — ABNORMAL LOW (ref 3.5–5.0)
Alkaline Phosphatase: 68 U/L (ref 38–126)
Anion gap: 7 (ref 5–15)
BUN: 6 mg/dL — ABNORMAL LOW (ref 8–23)
CO2: 24 mmol/L (ref 22–32)
Calcium: 8.6 mg/dL — ABNORMAL LOW (ref 8.9–10.3)
Chloride: 105 mmol/L (ref 98–111)
Creatinine, Ser: 0.77 mg/dL (ref 0.44–1.00)
GFR, Estimated: >60 mL/min (ref >60)
Glucose, Bld: 102 mg/dL — ABNORMAL HIGH (ref 70–99)
Potassium: 3.8 mmol/L (ref 3.5–5.1)
Sodium: 136 mmol/L (ref 135–145)
Total Bilirubin: 0.6 mg/dL (ref 0.3–1.2)
Total Protein: 5.9 g/dL — ABNORMAL LOW (ref 6.5–8.1)

## 2023-09-14 LAB — CBC WITH DIFFERENTIAL/PLATELET
Abs Immature Granulocytes: 0.02 10*3/uL (ref 0.00–0.07)
Basophils Absolute: 0.1 10*3/uL (ref 0.0–0.1)
Basophils Relative: 1 %
Eosinophils Absolute: 0.2 10*3/uL (ref 0.0–0.5)
Eosinophils Relative: 3 %
HCT: 33.3 % — ABNORMAL LOW (ref 36.0–46.0)
Hemoglobin: 10.3 g/dL — ABNORMAL LOW (ref 12.0–15.0)
Immature Granulocytes: 0 %
Lymphocytes Relative: 31 %
Lymphs Abs: 1.5 10*3/uL (ref 0.7–4.0)
MCH: 27.9 pg (ref 26.0–34.0)
MCHC: 30.9 g/dL (ref 30.0–36.0)
MCV: 90.2 fL (ref 80.0–100.0)
Monocytes Absolute: 0.5 10*3/uL (ref 0.1–1.0)
Monocytes Relative: 10 %
Neutro Abs: 2.6 10*3/uL (ref 1.7–7.7)
Neutrophils Relative %: 55 %
Platelets: 304 10*3/uL (ref 150–400)
RBC: 3.69 MIL/uL — ABNORMAL LOW (ref 3.87–5.11)
RDW: 18.1 % — ABNORMAL HIGH (ref 11.5–15.5)
WBC: 4.8 10*3/uL (ref 4.0–10.5)
nRBC: 0 % (ref 0.0–0.2)

## 2023-09-14 LAB — GLUCOSE, CAPILLARY
Glucose-Capillary: 103 mg/dL — ABNORMAL HIGH (ref 70–99)
Glucose-Capillary: 98 mg/dL (ref 70–99)
Glucose-Capillary: 82 mg/dL (ref 70–99)
Glucose-Capillary: 93 mg/dL (ref 70–99)

## 2023-09-14 MED ORDER — OXYCODONE HCL 5 MG PO TABS
5.0000 mg | ORAL_TABLET | ORAL | Status: DC | PRN
  Administered 2023-09-14 – 2023-09-19 (×20): 5 mg via ORAL
  Filled 2023-09-14 (×19): qty 1

## 2023-09-14 MED ORDER — ALPRAZOLAM 0.5 MG PO TABS
0.5000 mg | ORAL_TABLET | Freq: Three times a day (TID) | ORAL | Status: AC | PRN
  Administered 2023-09-14 – 2023-09-15 (×3): 0.5 mg via ORAL
  Filled 2023-09-14 (×3): qty 1

## 2023-09-14 MED ORDER — AMOXICILLIN-POT CLAVULANATE 875-125 MG PO TABS
1.0000 | ORAL_TABLET | Freq: Two times a day (BID) | ORAL | Status: DC
  Administered 2023-09-14 – 2023-09-20 (×12): 1 via ORAL
  Filled 2023-09-14 (×13): qty 1

## 2023-09-14 NOTE — Progress Notes (Signed)
PROGRESS NOTE    [name]  WUJ:[id number] DOB: 06-14-1946 DOA: 09/09/2023 PCP: Marguarite Arbour, MD  Chief Complaint  Patient presents with   Emesis    Brief Narrative:   77 y.o. female with medical history significant of HTN, HLD, diverticulosis, DM2, ETOH abuse in remission, right ICA stenosis status post stenting, interim history of admission 8/25/-8/29 for lower gi bleed presumed diverticular with associated hemorrhagic shock which required icu admission while on DAPT.   Patient course further complicated by appendectomy 2 weeks ago. Patient presents  who further interim history of feeling poorly over the last few days with n/v/d and abdominal  pain. However on waking this am around 2 am with  [name] bout of n/v she noted weakness/numbness in right leg. Due to this EMS was called.   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  SCA's not identified. Improved cervical L ICA following stenting.  High grade contralateral proximal R ICA stenosis. - MRI brain without acute ischemia - MRA with poor flow/occlusion of basilar artery tip and R PCA origin  - was on aspirin/plavix prior to admission -> ASA/brilinta in preparation of stenting - plan was for repeat UA/culture after 7 days abx - crestor  - A1c 5.7, LDL 117 - plan is for basilar artery stenting once infection controlled (will discuss with neuro IR and neurology whether she needs to remain inpatient for this)    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 -blood cultures pending  -transition to augmentin, plan for additional 10 days abx     Mild thickening and inflammation of ileocecal junction and proximal ascending colon Distal ileitis/colitis (infectious vs inflammatory vs ischemic?) C diff antigen positive, negative toxin -> message sent to Dr. Drue Second regarding confirmatory testing  Pending GI path panel  Appreciate surgery assistance with recent appendectomy 2 weeks ago -> recommending vascular surgery c/s with regards to question of stenoses of mesenteric vessels potentially contributing to presentation  Appreciate GI assistance -> r/o c diff, no endoscopy/colonoscopy currently recommended -> needs outpatient Vascular has been consulted Augmentin     Hypokalemia -Replete  Diabetes mellitus type 2 -Continue sliding scale insulin with NovoLog -at home on trulicity, jardiance, metformin    Recent history of GI bleed -trend Hb/Hct   Hypertension -BP goal 130-160 before basilar artery revascularization -Continue nebivolol, -BP continues to be elevated, will increase lisinopril to 10 mg daily -Continue as needed IV hydralazine   Anxiety -limit benzo to 1 more day, if needs beyond this, will need to rediscuss, consider other options    Diarrhea -She has history of chronic diarrhea -Takes Imodium as needed at home -Started Imodium as needed    DVT prophylaxis: lovenox Code Status: full Family Communication: none Disposition:   Status is: Inpatient Remains inpatient appropriate because: continued need for inpatient care   Consultants:  Neurology Neuro IR Vascular Surgery GI   Procedures:  Echo IMPRESSIONS     1. Left ventricular ejection fraction, by estimation, is 60 to 65%. The  left ventricle has normal function. The left ventricle has no regional  wall motion abnormalities. There is mild left ventricular hypertrophy.  Left ventricular diastolic parameters  are consistent with Grade I diastolic dysfunction (impaired relaxation).   2. Right ventricular systolic function is normal. The  right ventricular  size is normal. Tricuspid regurgitation signal is inadequate for assessing  PA pressure.   3. Resolution is challenging for bubble study; possible small PFO with [name]  few bubble within 3-5 cardiac cycles.   4. The mitral valve is normal in structure. No evidence of mitral valve  regurgitation.   5. The aortic valve is normal in structure. Aortic valve regurgitation is  not visualized.   6. The inferior vena cava is normal in size with greater than 50%  respiratory variability, suggesting right atrial pressure of 3 mmHg.   Conclusion(s)/Recommendation(s): Possible small PFO; considering his age  and comorbidites stroke etiology is multifactorial. Would not w/u for PFO  closure.   Antimicrobials:  Anti-infectives (From admission, onward)    Start     Dose/Rate Route Frequency Ordered Stop   09/10/23 1030  piperacillin-tazobactam (ZOSYN) IVPB 3.375 g        3.375 g 12.5 mL/hr over 240 Minutes Intravenous Every 8 hours 09/10/23 0943         Subjective: Asking about pain meds and xanax  Going stir crazy Husband at bedside  Objective: Vitals:   09/13/23 2352 09/14/23 0445 09/14/23 0733 09/14/23 1247  BP: (!) 157/71 (!) 150/52 (!) 176/64 (!) 165/52  Pulse: 67 (!) 58 66 60  Resp: 19 18 18 18   Temp: 97.8 Charlene (36.6 C) 97.8 Charlene (36.6 C) 98.2 Charlene (36.8 C) 97.9 Charlene (36.6 C)  TempSrc: Oral Oral Oral Oral  SpO2: 100% 99% 98% 95%  Weight:      Height:        Intake/Output Summary (Last 24 hours) at 09/14/2023 1353 Last data filed at 09/14/2023 1338 Gross per 24 hour  Intake 2062.52 ml  Output 452 ml  Net 1610.52 ml   Filed Weights   09/09/23 0410  Weight: 65.8 kg    Examination:  General exam: Appears calm and comfortable  Respiratory system: unlabored Cardiovascular system: RRR Gastrointestinal system: s/nt/nd Central nervous system: Alert and oriented. No focal neurological deficits. Extremities: no LEE    Data Reviewed: I have personally reviewed  following labs and imaging studies  CBC: Recent Labs  Lab 09/09/23 0539 09/09/23 0543 09/11/23 1134 09/12/23 0746 09/13/23 0623  WBC 13.9*  --  5.6 5.0 4.3  NEUTROABS 11.4*  --   --   --   --   HGB 9.8* 10.5* 9.3* 10.2* 9.6*  HCT 33.0* 31.0* 29.6* 32.2* 31.2*  MCV 94.6  --  88.6 86.1 88.1  PLT 356  --  285 309 295    Basic Metabolic Panel: Recent Labs  Lab 09/09/23 0539 09/09/23 0543 09/11/23 1134 09/12/23 0746 09/13/23 0623  NA 137 135 137 139 138  K 4.4 4.6 3.9 3.4* 4.5  CL 102 106 97* 101 107  CO2 17*  --  22 21* 25  GLUCOSE 178* 178* 126* 131* 105*  BUN 23 23 8 8  6*  CREATININE 1.17* 1.20* 0.86 0.81 0.86  CALCIUM 8.5*  --  8.7* 9.0 8.9    GFR: Estimated Creatinine Clearance: 50.3 mL/min (by C-G formula based on SCr of 0.86 mg/dL).  Liver Function Tests: Recent Labs  Lab 09/09/23 0539 09/11/23 1134 09/12/23 0746  AST 20 17 17   ALT 14 13 18   ALKPHOS 63 68 71  BILITOT 0.3 0.5 0.8  PROT 5.4* 5.4* 5.9*  ALBUMIN  3.0* 2.6* 2.8*    CBG: Recent Labs  Lab 09/13/23 1114 09/13/23 1730 09/13/23 2117 09/14/23 0617 09/14/23 1246  GLUCAP 95 96 100* 103* 98     Recent Results (from the past 240 hour(s))  Urine Culture     Status: Abnormal   Collection Time: 09/09/23  6:30 PM   Specimen: Urine, Clean Catch  Result Value Ref Range Status   Specimen Description URINE, CLEAN CATCH  Final   Special Requests   Final    NONE Reflexed from [id number] Performed at [facility], [address] [place], [place] [address]    Culture >=100,000 COLONIES/mL KLEBSIELLA OXYTOCA ([name])  Final   Report Status 09/11/2023 FINAL  Final   Organism ID, Bacteria KLEBSIELLA OXYTOCA ([name])  Final      Susceptibility   Klebsiella oxytoca - MIC*    AMPICILLIN >=32 RESISTANT Resistant     CEFEPIME <=0.12 SENSITIVE Sensitive     CEFTRIAXONE <=0.25 SENSITIVE Sensitive     CIPROFLOXACIN <=0.25 SENSITIVE Sensitive     GENTAMICIN <=1 SENSITIVE Sensitive     IMIPENEM <=0.25 SENSITIVE  Sensitive     NITROFURANTOIN 32 SENSITIVE Sensitive     TRIMETH/SULFA <=20 SENSITIVE Sensitive     AMPICILLIN/SULBACTAM 8 SENSITIVE Sensitive     PIP/TAZO <=4 SENSITIVE Sensitive     * >=100,000 COLONIES/mL KLEBSIELLA OXYTOCA  MRSA Next Gen by PCR, Nasal     Status: Abnormal   Collection Time: 09/09/23  8:05 PM   Specimen: Nasal Mucosa; Nasal Swab  Result Value Ref Range Status   MRSA by PCR Next Gen DETECTED ([name]) NOT DETECTED Final    Comment: RESULT CALLED TO, READ BACK BY AND VERIFIED WITH: Charlene OKASOR,RN@0318  09/10/23 MK (NOTE) The GeneXpert MRSA Assay (FDA approved for NASAL specimens only), is one component of [name] comprehensive MRSA colonization surveillance program. It is not intended to diagnose MRSA infection nor to guide or monitor treatment for MRSA infections. Test performance is not FDA approved in patients less than 60 years old. Performed at [facility], [address], [place], [place] [address]   C Difficile Quick Screen (NO PCR Reflex)     Status: Abnormal   Collection Time: 09/13/23  9:40 PM   Specimen: STOOL  Result Value Ref Range Status   C Diff antigen POSITIVE ([name]) NEGATIVE Final   C Diff toxin NEGATIVE NEGATIVE Final   C Diff interpretation   Final    Results are indeterminate. Please contact the provider listed for your campus for C diff questions in AMION.    Comment: Performed at [facility], [address], [place], [place] [address]  Culture, blood (Routine X 2) w Reflex to ID Panel     Status: None (Preliminary result)   Collection Time: 09/13/23  9:56 PM   Specimen: BLOOD  Result Value Ref Range Status   Specimen Description BLOOD BLOOD LEFT ARM  Final   Special Requests   Final    BOTTLES DRAWN AEROBIC AND ANAEROBIC Blood Culture adequate volume   Culture   Final    NO GROWTH < 12 HOURS Performed at [facility], [address], [place], [place] [address]    Report Status PENDING  Incomplete  Culture, blood (Routine X 2) w  Reflex to ID Panel     Status: None (Preliminary result)   Collection Time: 09/13/23  9:57 PM   Specimen: BLOOD  Result Value Ref Range Status   Specimen Description BLOOD  BLOOD LEFT HAND  Final   Special Requests   Final    BOTTLES DRAWN AEROBIC AND ANAEROBIC Blood Culture adequate volume   Culture   Final    NO GROWTH < 12 HOURS Performed at [facility], [address], [place], [place] [address]    Report Status PENDING  Incomplete         Radiology Studies: No results found.      Scheduled Meds:  aspirin  81 mg Oral Daily   enoxaparin (LOVENOX) injection  40 mg Subcutaneous Q24H   insulin aspart  0-9 Units Subcutaneous TID WC   lisinopril  10 mg Oral Daily   mupirocin ointment  1 Application Nasal BID   nebivolol  5 mg Oral Daily   rosuvastatin  20 mg Oral Daily   ticagrelor  90 mg Oral BID   Continuous Infusions:  lactated ringers 75 mL/hr at 09/14/23 1121   piperacillin-tazobactam (ZOSYN)  IV 3.375 g (09/14/23 1349)     LOS: 5 days    Time spent: over 30 min    Lacretia Nicks, MD [facility]   To contact the attending provider between 7A-7P or the covering provider during after hours 7P-7A, please log into the web site [email or web address] and access using universal [place] password for that web site. If you do not have the password, please call the hospital operator.  09/14/2023, 1:53 PM

## 2023-09-14 NOTE — Consult Note (Addendum)
Hospital Consult    Reason for Consult:  concern for mesenteric ischemia Requesting Physician:  Lowell Guitar MD MRN #:  [id number]  History of Present Illness: [name] is a 77 y.o. female with a PMH of DMII, diverticulitis, HTN, HLD, and fibromyalgia who was admitted to the hospital on 09/09/2023 with right leg numbness and weakness. MRA demonstrated occlusion of the basilar artery. During her admission she was also found to have leukocytosis with CT abd/pelvis demonstrating emphysematous cystitis and thickening and inflammation a the ileocecal junction and proximal ascending colon.   She has a chronic history of colitis with loose stools, dating back to college. During her colitis flares she experiences right lower quadrant abdominal pain. She also recently required appendectomy in [facility] about 3 weeks ago. After her surgery she had continued issues with nausea, vomiting, and poor appetite. Her N/V has resolved however she continues to have a poor appetite. She continues to also have right lower quadrant abdominal pain and diarrhea.   We were asked to evaluate the patient for mesenteric ischemia. Today the patient states she still does not have much of an appetite. She denies any abdominal pain at rest, but her right lower quadrant is still sore to deep palpation. She denies any N/V. She has had diarrhea however this is currently improved with Imodium. She denies any current or previous history of food fear, pain in her abdomen after eating, or unintentional weight loss.    Past Medical History:  Diagnosis Date   Alcohol abuse    CAP (community acquired pneumonia) 05/05/2018   Carotid artery stenosis    Depression    Diabetes mellitus    Diverticulitis    Essential hypertension 08/20/2007   Qualifier: Diagnosis of   By: Jonny Ruiz MD, Len Blalock        Fibromyalgia    Hepatitis 08/28/2014   Hyperlipidemia 08/20/2007   Qualifier: Diagnosis of   By: Jonny Ruiz MD, Len Blalock        Hypertension     Myalgia and myositis 03/28/2012    Past Surgical History:  Procedure Laterality Date   BUNIONECTOMY     CAROTID PTA/STENT INTERVENTION Left 08/07/2020   Procedure: CAROTID PTA/STENT INTERVENTION;  Surgeon: Annice Needy, MD;  Location: [facility] INVASIVE CV LAB;  Service: Cardiovascular;  Laterality: Left;   CESAREAN SECTION     COLONOSCOPY     COLONOSCOPY WITH ESOPHAGOGASTRODUODENOSCOPY (EGD)     COLONOSCOPY WITH PROPOFOL N/A 02/18/2021   Procedure: COLONOSCOPY WITH PROPOFOL;  Surgeon: Toledo, Boykin Nearing, MD;  Location: [facility] ENDOSCOPY;  Service: Gastroenterology;  Laterality: N/A;   ESOPHAGOGASTRODUODENOSCOPY (EGD) WITH PROPOFOL N/A 02/18/2021   Procedure: ESOPHAGOGASTRODUODENOSCOPY (EGD) WITH PROPOFOL;  Surgeon: Toledo, Boykin Nearing, MD;  Location: [facility] ENDOSCOPY;  Service: Gastroenterology;  Laterality: N/A;   IR ANGIO INTRA EXTRACRAN SEL COM CAROTID INNOMINATE BILAT MOD SED  09/09/2023   IR ANGIO VERTEBRAL SEL VERTEBRAL BILAT MOD SED  09/09/2023   TONSILLECTOMY      Allergies  Allergen Reactions   Diflucan [Fluconazole] Nausea And Vomiting   Latex Hives   Lipitor [Atorvastatin Calcium] Other (See Comments)    Muscle aches   Codeine Rash    And nausea    Pentazocine Lactate Rash   Sulfonamide Derivatives Rash    Prior to Admission medications   Medication Sig Start Date End Date Taking? Authorizing Provider  amphetamine-dextroamphetamine (ADDERALL XR) 20 MG 24 hr capsule Take 40 mg by mouth every morning. 06/29/23  Yes [provider]  aspirin EC  81 MG EC tablet Take 1 tablet (81 mg total) by mouth daily. Swallow whole. 08/08/20  Yes Stegmayer, Cala Bradford A, PA-C  B Complex Vitamins (B COMPLEX-B12) TABS Take 1 tablet by mouth at bedtime.   Yes [provider]  BYSTOLIC 5 MG tablet TAKE 1 TABLET DAILY Patient taking differently: Take 5 mg by mouth daily. 09/26/15  Yes Carlus Pavlov, MD  clopidogrel (PLAVIX) 75 MG tablet TAKE 1 TABLET BY MOUTH EVERY DAY Patient taking  differently: Take 75 mg by mouth daily. 07/18/23  Yes Georgiana Spinner, NP  diphenoxylate-atropine (LOMOTIL) 2.5-0.025 MG tablet Take 1 tablet by mouth 4 (four) times daily as needed for diarrhea or loose stools.  07/08/10  Yes [provider]  Dulaglutide (TRULICITY) 3 MG/0.5ML SOPN Inject 3 mg into the skin once a week. 06/23/23  Yes   empagliflozin (JARDIANCE) 25 MG TABS tablet Take 25 mg by mouth daily.   Yes [provider]  EPINEPHrine 0.3 mg/0.3 mL IJ SOAJ injection Inject 0.3 mg into the muscle as needed for anaphylaxis.   Yes [provider]  lisinopril (PRINIVIL,ZESTRIL) 5 MG tablet TAKE 1 TABLET BY MOUTH EVERY DAY Patient taking differently: Take 5 mg by mouth daily. 07/14/15  Yes Carlus Pavlov, MD  metFORMIN (GLUCOPHAGE) 1000 MG tablet Take 1,000 mg by mouth every evening. 10/19/21  Yes [provider]  omeprazole (PRILOSEC) 40 MG capsule Take 40 mg by mouth daily. 08/30/18  Yes [provider]  pregabalin (LYRICA) 300 MG capsule Take 300 mg by mouth 2 (two) times daily. 08/05/20  Yes [provider]  glucose blood (BAYER CONTOUR NEXT TEST) test strip Use to test blood sugar 3 times daily as instructed. Dx code: E11.65 02/10/15   Carlus Pavlov, MD  Lancets (ACCU-CHEK MULTICLIX) lancets Use to test blood sugar 3 times daily as instructed. Dx: E11.65 02/10/15   Carlus Pavlov, MD    Social History   Socioeconomic History   Marital status: Married    Spouse name: Not on file   Number of children: Not on file   Years of education: Not on file   Highest education level: Not on file  Occupational History   Not on file  Tobacco Use   Smoking status: Every Day    Current packs/day: 0.00    Types: Cigarettes, E-cigarettes    Last attempt to quit: 03/11/2010    Years since quitting: 13.5   Smokeless tobacco: Never  Vaping Use   Vaping status: Former  Substance and Sexual Activity   Alcohol use: Not Currently   Drug use: No     Comment: Pt denies    Sexual activity: Never  Other Topics Concern   Not on file  Social History Narrative   Not on file   Social Determinants of Health   Financial Resource Strain: Not on file  Food Insecurity: No Food Insecurity (09/09/2023)   Hunger Vital Sign    Worried About Running Out of Food in the Last Year: Never true    Ran Out of Food in the Last Year: Never true  Transportation Needs: No Transportation Needs (09/09/2023)   PRAPARE - Administrator, Civil Service (Medical): No    Lack of Transportation (Non-Medical): No  Physical Activity: Not on file  Stress: Not on file  Social Connections: Not on file  Intimate Partner Violence: Not At Risk (09/09/2023)   Humiliation, Afraid, Rape, and Kick questionnaire    Fear of Current or Ex-Partner: No  Emotionally Abused: No    Physically Abused: No    Sexually Abused: No     Family History  Problem Relation Age of Onset   Cancer Mother    Heart disease Father    Heart disease Sister     ROS: Otherwise negative unless mentioned in HPI  Physical Examination  Vitals:   09/14/23 0733 09/14/23 1247  BP: (!) 176/64 (!) 165/52  Pulse: 66 60  Resp: 18 18  Temp: 98.2 F (36.8 C) 97.9 F (36.6 C)  SpO2: 98% 95%   Body mass index is 26.52 kg/m.  General:  WDWN in NAD Gait: Not observed HENT: WNL, normocephalic Pulmonary: normal non-labored breathing, without Rales, rhonchi,  wheezing Cardiac: regularc Abdomen:  soft, nondistended, RLQ tenderness to deep palpation Skin: without rashes Vascular Exam/Pulses: palpable pedal pulses 2+ Extremities: without ischemic changes, without Gangrene , without cellulitis; without open wounds;  Musculoskeletal: no muscle wasting or atrophy  Neurologic: A&O X 3;  No focal weakness or paresthesias are detected; speech is fluent/normal Psychiatric:  The pt has Normal affect. Lymph:  Unremarkable  CBC    Component Value Date/Time   WBC 4.3 09/13/2023 0623   RBC  3.54 (L) 09/13/2023 0623   HGB 9.6 (L) 09/13/2023 0623   HCT 31.2 (L) 09/13/2023 0623   PLT 295 09/13/2023 0623   MCV 88.1 09/13/2023 0623   MCH 27.1 09/13/2023 0623   MCHC 30.8 09/13/2023 0623   RDW 18.8 (H) 09/13/2023 0623   LYMPHSABS 1.4 09/09/2023 0539   MONOABS 0.9 09/09/2023 0539   EOSABS 0.0 09/09/2023 0539   BASOSABS 0.1 09/09/2023 0539    BMET    Component Value Date/Time   NA 138 09/13/2023 0623   K 4.5 09/13/2023 0623   CL 107 09/13/2023 0623   CO2 25 09/13/2023 0623   GLUCOSE 105 (H) 09/13/2023 0623   GLUCOSE 123 (H) 11/17/2006 1240   BUN 6 (L) 09/13/2023 0623   CREATININE 0.86 09/13/2023 0623   CREATININE 0.81 09/16/2014 1759   CALCIUM 8.9 09/13/2023 0623   GFRNONAA >60 09/13/2023 0623   GFRNONAA 76 09/16/2014 1759   GFRAA >60 08/08/2020 0803   GFRAA 88 09/16/2014 1759    COAGS: Lab Results  Component Value Date   INR 1.0 09/09/2023   INR 0.9 08/07/2023   INR 0.9 08/15/2022     Non-Invasive Vascular Imaging:   Mesenteric Duplex Ordered    ASSESSMENT/PLAN: This is a 77 y.o. female admitted for basilar artery occlusion   -The patient was admitted with right leg weakness and numbness on 09/09/2023. She was found to have a basilar artery occlusion. Upon admission she also had ileocecal and proximal ascending colon inflammation with leukocytosis. She is undergoing workup for possible infection -The patient has a chronic history of diverticulitis with right lower quadrant pain and diarrhea. She also has a recent history of appendectomy causing poor oral intake. There was concern for possible mesenteric ischemia contributing to her abdominal pain and colon inflammation. -She denies any nausea or vomiting. Her diarrhea is currently controlled with Imodium. She denies any current or prior history of abdominal pain after eating, food fear, or unintentional weight loss. She states her lower abdomen, particularly on the right, typically is sore during colitis  flares. On exam her RLQ is tender to deep palpation -CT abd/pelvis demonstrates some mesenteric stenosis, however no complete occlusions. She has no obvious signs of acute or mesenteric ischemia. I do not think her mesenteric disease is currently contributing to her  GI issues. -We will order a mesenteric duplex for complete workup. Dr.Kayliah Tindol will see the patient and offer further treatment plans   Loel Dubonnet PA-C [facility] [phone]   I have seen and evaluated the patient. I agree with the PA note as documented above.  77 year old female that vascular surgery was asked to evaluate for mesenteric ischemia.  She states 3 weeks ago she had appendectomy done laparoscopically in [facility].  Has not felt well since appendectomy with no appetite.  States prior appendectomy was doing very well with no significant abdominal pain or postprandial pain.  Now here with slurred speech and found to have TIA.  She has undergone angiogram with Dr. 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  States when she got home she felt weak and started having slurred speech that prompted her presentation here for stroke work up and returning from [employer].  States when she eats that she has no significant abdominal pain but still does not have much of an appetite.  Has some mild right lower quadrant pain on exam.  I reviewed her CT this admission and she has some plaque in the celiac and SMA but the vessels are patent.  IMA also looks patent.  Will check mesenteric duplex.  Her symptoms do not sound like classic mesenteric ischemia.  Vascular surgery will follow.  Cephus Shelling, MD [facility] [place] Office: [phone]

## 2023-09-14 NOTE — Plan of Care (Signed)
  Problem: Education: Goal: Knowledge of General Education information will improve Description Including pain rating scale, medication(s)/side effects and non-pharmacologic comfort measures Outcome: Progressing   

## 2023-09-14 NOTE — Progress Notes (Signed)
PT Cancellation Note  Patient Details Name: ATLAS KUC MRN: [id number] DOB: 12-08-1946   Cancelled Treatment:    Reason Eval/Treat Not Completed: Patient declined, no reason specified  Doesn't feel like participating with PT today. Encouraged OOB mobility with staff as tolerated to limit risks associated with immobility during admission. Verb understanding.  Kathlyn Sacramento, PT, DPT [facility]  Rehabilitation Services Physical Therapist Office: [phone] Website: [email or web address]   Berton Mount 09/14/2023, 5:01 PM

## 2023-09-14 NOTE — Telephone Encounter (Signed)
Unfortunately we are not aware of all the surrounding circumstances with her hospitalization.  We recommend asking to discuss with the Vascular Surgery attending at [facility] to discuss her situation in greater detail.  We have worked with physicians in [place] and they are able to provide safe and competent care.

## 2023-09-14 NOTE — Telephone Encounter (Signed)
Patient called she in currently admitted to [facility]. They are waiting to do an angio on Monday patient is not comfortable getting this done with out talking to Festus Barren or someone in vascular      Please call and advise

## 2023-09-14 NOTE — Plan of Care (Signed)
  Problem: Safety: Goal: Ability to remain free from injury will improve Outcome: Progressing   Problem: Health Behavior/Discharge Planning: Goal: Ability to manage health-related needs will improve Outcome: Progressing

## 2023-09-15 ENCOUNTER — Inpatient Hospital Stay (HOSPITAL_COMMUNITY): Payer: Medicare HMO

## 2023-09-15 DIAGNOSIS — G459 Transient cerebral ischemic attack, unspecified: Secondary | ICD-10-CM | POA: Diagnosis not present

## 2023-09-15 LAB — GLUCOSE, CAPILLARY
Glucose-Capillary: 113 mg/dL — ABNORMAL HIGH (ref 70–99)
Glucose-Capillary: 95 mg/dL (ref 70–99)
Glucose-Capillary: 136 mg/dL — ABNORMAL HIGH (ref 70–99)
Glucose-Capillary: 130 mg/dL — ABNORMAL HIGH (ref 70–99)

## 2023-09-15 LAB — COMPREHENSIVE METABOLIC PANEL
ALT: 26 U/L (ref 0–44)
AST: 26 U/L (ref 15–41)
Albumin: 2.6 g/dL — ABNORMAL LOW (ref 3.5–5.0)
Alkaline Phosphatase: 61 U/L (ref 38–126)
Anion gap: 9 (ref 5–15)
BUN: 5 mg/dL — ABNORMAL LOW (ref 8–23)
CO2: 23 mmol/L (ref 22–32)
Calcium: 8.3 mg/dL — ABNORMAL LOW (ref 8.9–10.3)
Chloride: 103 mmol/L (ref 98–111)
Creatinine, Ser: 0.83 mg/dL (ref 0.44–1.00)
GFR, Estimated: >60 mL/min (ref >60)
Glucose, Bld: 113 mg/dL — ABNORMAL HIGH (ref 70–99)
Potassium: 3.1 mmol/L — ABNORMAL LOW (ref 3.5–5.1)
Sodium: 135 mmol/L (ref 135–145)
Total Bilirubin: 0.3 mg/dL (ref 0.3–1.2)
Total Protein: 5.3 g/dL — ABNORMAL LOW (ref 6.5–8.1)

## 2023-09-15 LAB — CBC WITH DIFFERENTIAL/PLATELET
Abs Immature Granulocytes: 0.02 10*3/uL (ref 0.00–0.07)
Basophils Absolute: 0 10*3/uL (ref 0.0–0.1)
Basophils Relative: 1 %
Eosinophils Absolute: 0.1 10*3/uL (ref 0.0–0.5)
Eosinophils Relative: 3 %
HCT: 31.4 % — ABNORMAL LOW (ref 36.0–46.0)
Hemoglobin: 9.8 g/dL — ABNORMAL LOW (ref 12.0–15.0)
Immature Granulocytes: 1 %
Lymphocytes Relative: 31 %
Lymphs Abs: 1.2 10*3/uL (ref 0.7–4.0)
MCH: 28.1 pg (ref 26.0–34.0)
MCHC: 31.2 g/dL (ref 30.0–36.0)
MCV: 90 fL (ref 80.0–100.0)
Monocytes Absolute: 0.4 10*3/uL (ref 0.1–1.0)
Monocytes Relative: 11 %
Neutro Abs: 2.2 10*3/uL (ref 1.7–7.7)
Neutrophils Relative %: 53 %
Platelets: 254 10*3/uL (ref 150–400)
RBC: 3.49 MIL/uL — ABNORMAL LOW (ref 3.87–5.11)
RDW: 18 % — ABNORMAL HIGH (ref 11.5–15.5)
WBC: 4 10*3/uL (ref 4.0–10.5)
nRBC: 0 % (ref 0.0–0.2)

## 2023-09-15 LAB — MAGNESIUM: Magnesium: 1.4 mg/dL — ABNORMAL LOW (ref 1.7–2.4)

## 2023-09-15 LAB — PHOSPHORUS: Phosphorus: 2.9 mg/dL (ref 2.5–4.6)

## 2023-09-15 LAB — CLOSTRIDIUM DIFFICILE BY PCR, REFLEXED: Toxigenic C. Difficile by PCR: POSITIVE — AB

## 2023-09-15 MED ORDER — VANCOMYCIN HCL 125 MG PO CAPS
125.0000 mg | ORAL_CAPSULE | Freq: Four times a day (QID) | ORAL | Status: DC
  Administered 2023-09-15 – 2023-09-20 (×18): 125 mg via ORAL
  Filled 2023-09-15 (×22): qty 1

## 2023-09-15 MED ORDER — POTASSIUM CHLORIDE CRYS ER 20 MEQ PO TBCR
40.0000 meq | EXTENDED_RELEASE_TABLET | ORAL | Status: AC
  Administered 2023-09-15 (×2): 40 meq via ORAL
  Filled 2023-09-15 (×2): qty 2

## 2023-09-15 MED ORDER — MAGNESIUM SULFATE 4 GM/100ML IV SOLN
4.0000 g | Freq: Once | INTRAVENOUS | Status: AC
  Administered 2023-09-15: 4 g via INTRAVENOUS
  Filled 2023-09-15: qty 100

## 2023-09-15 MED ORDER — VANCOMYCIN HCL 125 MG PO CAPS: 125.0000 mg | ORAL_CAPSULE | Freq: Four times a day (QID) | ORAL | Status: DC

## 2023-09-15 NOTE — Telephone Encounter (Signed)
Called and let patient know what NP Sheppard Plumber stated and patient understood and ok with it

## 2023-09-15 NOTE — Plan of Care (Signed)
  Problem: Safety: Goal: Ability to remain free from injury will improve Outcome: Progressing   Problem: Skin Integrity: Goal: Risk for impaired skin integrity will decrease Outcome: Progressing   

## 2023-09-15 NOTE — Progress Notes (Signed)
[facility]  Subjective  -states she ate half a [place] sandwich, juice and a cookie with no abdominal pain at lunch.   Objective (!) 146/58 64 97.8 F (36.6 C) (Oral) 18 96%  Intake/Output Summary (Last 24 hours) at 09/15/2023 1622 Last data filed at 09/15/2023 1242 Gross per 24 hour  Intake 878.44 ml  Output 1400 ml  Net -521.56 ml    Mild right lower quadrant tenderness  Laboratory Lab Results: Recent Labs    09/14/23 1456 09/15/23 0745  WBC 4.8 4.0  HGB 10.3* 9.8*  HCT 33.3* 31.4*  PLT 304 254   BMET Recent Labs    09/14/23 1456 09/15/23 0745  NA 136 135  K 3.8 3.1*  CL 105 103  CO2 24 23  GLUCOSE 102* 113*  BUN 6* 5*  CREATININE 0.77 0.83  CALCIUM 8.6* 8.3*    COAG Lab Results  Component Value Date   INR 1.0 09/09/2023   INR 0.9 08/07/2023   INR 0.9 08/15/2022   No results found for: "PTT"  Assessment/Planning:  77 year old female with complex history including recent appendectomy about 3 weeks ago in [place] now admitted with a stroke with plans for possible basilar artery stenting on Monday.  Vascular surgery was asked to evaluate for mesenteric ischemia.  Discussed that her duplex did show high-grade stenosis over 70% in the celiac and SMA by duplex velocity.  That being said she does not have classic mesenteric ischemia symptoms.  She denies any food fear.  She states she ate half a [place] sandwich and juice and a cookie at lunch without any abdominal pain.  We can certainly follow her clinical progress.  I would not rush to mesenteric angiogram and stenting her immediately pending her clinical recovery.  I think she can proceed with her intervention with IR on Monday.  This may be followed on an outpatient basis and she is established with Dr. Wyn Quaker at [facility].  Cephus Shelling 09/15/2023 4:22 PM --

## 2023-09-15 NOTE — TOC Progression Note (Signed)
Transition of Care [facility]) - Progression Note    Patient Details  Name: [name] MRN: [id number] Date of Birth: 15-Jul-1946  [facility] CM/SW Contact  Kermit Balo, RN Phone Number: 09/15/2023, 1:37 PM  Clinical Narrative:     Plan is for stent on Monday. TOC following.  Expected Discharge Plan: OP Rehab Barriers to Discharge: Continued Medical Work up  Expected Discharge Plan and Services   Discharge Planning Services: CM Consult   Living arrangements for the past 2 months: Single Family Home                                       Social Determinants of Health (SDOH) Interventions SDOH Screenings   Food Insecurity: No Food Insecurity (09/09/2023)  Housing: Low Risk  (09/09/2023)  Transportation Needs: No Transportation Needs (09/09/2023)  Utilities: Not At Risk (09/09/2023)  Tobacco Use: High Risk (09/13/2023)    Readmission Risk Interventions    08/10/2023    6:17 PM  Readmission Risk Prevention Plan  Post Dischage Appt Complete  Medication Screening Complete  Transportation Screening Complete

## 2023-09-15 NOTE — Progress Notes (Signed)
PROGRESS NOTE    [name]  ZOX:[id number] DOB: [birth year] DOA: 09/09/2023 PCP: Marguarite Arbour, MD  Chief Complaint  Patient presents with   Emesis    Brief Narrative:   77 y.o. female with medical history significant of HTN, HLD, diverticulosis, DM2, ETOH abuse in remission, right ICA stenosis status post stenting, interim history of admission 8/25/-8/29 for lower gi bleed presumed diverticular with associated hemorrhagic shock which required icu admission while on DAPT.   Patient course further complicated by appendectomy 2 weeks ago. Patient presents  who further interim history of feeling poorly over the last few days with n/v/d and abdominal  pain. However on waking this am around 2 am with  [name] bout of n/v she noted weakness/numbness in right leg. Due to this EMS was called.   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  SCA's not identified. Improved cervical L ICA following stenting.  High grade contralateral proximal R ICA stenosis. - MRI brain without acute ischemia - MRA with poor flow/occlusion of basilar artery tip and R PCA origin  - was on aspirin/plavix prior to admission -> ASA/brilinta in preparation of stenting - will repeat urine culture 10/4 - crestor  - A1c 5.7, LDL 117 - plan is for basilar artery stenting once infection controlled (Dr. Corliss Skains recommends completing this while inpatient)   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 -blood cultures NGTDx2  -transition to augmentin, plan for additional 10 days abx    Mild thickening and inflammation of ileocecal  junction and proximal ascending colon Distal ileitis/colitis (infectious vs inflammatory vs ischemic?) C diff antigen positive, negative toxin -> PCR positive, treating with vancomycin  Negative GI path panel  Appreciate surgery assistance with recent appendectomy 2 weeks ago -> recommending vascular surgery c/s with regards to question of stenoses of mesenteric vessels potentially contributing to presentation  Appreciate GI assistance -> r/o c diff, no endoscopy/colonoscopy currently recommended -> needs outpatient Vascular has been consulted Augmentin    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%  Appreciate vascular recommendations   Hypokalemia -Replete  Diabetes mellitus type 2 -Continue sliding scale insulin with NovoLog -at home on trulicity, jardiance, metformin    Recent history of GI bleed -trend Hb/Hct   Hypertension -BP goal 130-160 before basilar artery revascularization -Continue nebivolol, -lisinopril 10 mg was started 10/2 -adjust meds as needed for goal  -Continue as needed IV hydralazine   Anxiety -limit benzo to 1 more day, if needs beyond this, will need to rediscuss, consider other options    Diarrhea -She has history of chronic diarrhea -Takes Imodium as needed at home -Started Imodium as needed    DVT prophylaxis: lovenox Code Status: full Family Communication: none Disposition:   Status is: Inpatient Remains inpatient appropriate because: continued need for inpatient care   Consultants:  Neurology Neuro IR Vascular Surgery GI   Procedures:  Echo IMPRESSIONS     1. Left ventricular ejection fraction, by estimation, is 60 to 65%. The  left ventricle has normal function. The left ventricle has no regional  wall motion abnormalities. There is mild left ventricular hypertrophy.  Left  ventricular diastolic parameters  are consistent with Grade I diastolic dysfunction (impaired relaxation).   2. Right ventricular  systolic function is normal. The right ventricular  size is normal. Tricuspid regurgitation signal is inadequate for assessing  PA pressure.   3. Resolution is challenging for bubble study; possible small PFO with [name]  few bubble within 3-5 cardiac cycles.   4. The mitral valve is normal in structure. No evidence of mitral valve  regurgitation.   5. The aortic valve is normal in structure. Aortic valve regurgitation is  not visualized.   6. The inferior vena cava is normal in size with greater than 50%  respiratory variability, suggesting right atrial pressure of 3 mmHg.   Conclusion(s)/Recommendation(s): Possible small PFO; considering his age  and comorbidites stroke etiology is multifactorial. Would not w/u for PFO  closure.   Antimicrobials:  Anti-infectives (From admission, onward)    Start     Dose/Rate Route Frequency Ordered Stop   09/15/23 1400  vancomycin (VANCOCIN) capsule 125 mg  Status:  Discontinued        125 mg Oral 4 times daily 09/15/23 1218 09/15/23 1219   09/15/23 1400  vancomycin (VANCOCIN) capsule 125 mg        125 mg Oral 4 times daily 09/15/23 1219 10/05/23 1359   09/14/23 1445  amoxicillin-clavulanate (AUGMENTIN) 875-125 MG per tablet 1 tablet        1 tablet Oral Every 12 hours 09/14/23 1354 09/24/23 0959   09/10/23 1030  piperacillin-tazobactam (ZOSYN) IVPB 3.375 g  Status:  Discontinued        3.375 g 12.5 mL/hr over 240 Minutes Intravenous Every 8 hours 09/10/23 0943 09/14/23 1354       Subjective: Asking about pain meds and xanax  Going stir crazy Husband at bedside  Objective: Vitals:   09/15/23 0446 09/15/23 0800 09/15/23 1156 09/15/23 1544  BP: (!) 147/70 (!) 159/64 (!) 163/61 (!) 146/58  Pulse: 68 (!) 56 (!) 54 64  Resp: 18 18  18   Temp: 98.3 F (36.8 C) 98.3 F (36.8 C) 98.5 F (36.9 C) 97.8 F (36.6 C)  TempSrc: Axillary Oral Oral Oral  SpO2: 95% 100% 100% 96%  Weight:      Height:        Intake/Output Summary (Last 24 hours) at  09/15/2023 1621 Last data filed at 09/15/2023 1242 Gross per 24 hour  Intake 878.44 ml  Output 1400 ml  Net -521.56 ml   Filed Weights   09/09/23 0410  Weight: 65.8 kg    Examination:  General: No acute distress. Cardiovascular: RRR Lungs: unlabored Abdomen: Soft, nontender, nondistended Neurological: Alert and oriented 3. Moves all extremities 4 with equal strength. Cranial nerves II through XII grossly intact. Extremities: No clubbing or cyanosis. No edema.   Data Reviewed: I have personally reviewed following labs and imaging studies  CBC: Recent Labs  Lab 09/09/23 0539 09/09/23 0543 09/11/23 1134 09/12/23 0746 09/13/23 0623 09/14/23 1456 09/15/23 0745  WBC 13.9*  --  5.6 5.0 4.3 4.8 4.0  NEUTROABS 11.4*  --   --   --   --  2.6 2.2  HGB 9.8*   < > 9.3* 10.2* 9.6* 10.3* 9.8*  HCT 33.0*   < > 29.6* 32.2* 31.2* 33.3* 31.4*  MCV 94.6  --  88.6 86.1 88.1 90.2 90.0  PLT 356  --  285 309 295 304 254   < > = values in this interval not displayed.    Basic Metabolic Panel:  Recent Labs  Lab 09/11/23 1134 09/12/23 0746 09/13/23 0623 09/14/23 1456 09/15/23 0745  NA 137 139 138 136 135  K 3.9 3.4* 4.5 3.8 3.1*  CL 97* 101 107 105 103  CO2 22 21* 25 24 23   GLUCOSE 126* 131* 105* 102* 113*  BUN 8 8 6* 6* 5*  CREATININE 0.86 0.81 0.86 0.77 0.83  CALCIUM 8.7* 9.0 8.9 8.6* 8.3*  MG  --   --   --   --  1.4*  PHOS  --   --   --   --  2.9    GFR: Estimated Creatinine Clearance: 52.1 mL/min (by C-G formula based on SCr of 0.83 mg/dL).  Liver Function Tests: Recent Labs  Lab 09/09/23 0539 09/11/23 1134 09/12/23 0746 09/14/23 1456 09/15/23 0745  AST 20 17 17  33 26  ALT 14 13 18 26 26   ALKPHOS 63 68 71 68 61  BILITOT 0.3 0.5 0.8 0.6 0.3  PROT 5.4* 5.4* 5.9* 5.9* 5.3*  ALBUMIN 3.0* 2.6* 2.8* 3.0* 2.6*    CBG: Recent Labs  Lab 09/14/23 1647 09/14/23 2136 09/15/23 0646 09/15/23 1303 09/15/23 1603  GLUCAP 82 93 113* 95 136*     Recent Results (from  the past 240 hour(s))  Urine Culture     Status: Abnormal   Collection Time: 09/09/23  6:30 PM   Specimen: Urine, Clean Catch  Result Value Ref Range Status   Specimen Description URINE, CLEAN CATCH  Final   Special Requests   Final    NONE Reflexed from [id number] Performed at [facility], [address], [place], [place] [address]    Culture >=100,000 COLONIES/mL KLEBSIELLA OXYTOCA ([name])  Final   Report Status 09/11/2023 FINAL  Final   Organism ID, Bacteria KLEBSIELLA OXYTOCA ([name])  Final      Susceptibility   Klebsiella oxytoca - MIC*    AMPICILLIN >=32 RESISTANT Resistant     CEFEPIME <=0.12 SENSITIVE Sensitive     CEFTRIAXONE <=0.25 SENSITIVE Sensitive     CIPROFLOXACIN <=0.25 SENSITIVE Sensitive     GENTAMICIN <=1 SENSITIVE Sensitive     IMIPENEM <=0.25 SENSITIVE Sensitive     NITROFURANTOIN 32 SENSITIVE Sensitive     TRIMETH/SULFA <=20 SENSITIVE Sensitive     AMPICILLIN/SULBACTAM 8 SENSITIVE Sensitive     PIP/TAZO <=4 SENSITIVE Sensitive     * >=100,000 COLONIES/mL KLEBSIELLA OXYTOCA  MRSA Next Gen by PCR, Nasal     Status: Abnormal   Collection Time: 09/09/23  8:05 PM   Specimen: Nasal Mucosa; Nasal Swab  Result Value Ref Range Status   MRSA by PCR Next Gen DETECTED ([name]) NOT DETECTED Final    Comment: RESULT CALLED TO, READ BACK BY AND VERIFIED WITH: F OKASOR,RN@[id number]  09/10/23 MK (NOTE) The GeneXpert MRSA Assay (FDA approved for NASAL specimens only), is one component of [name] comprehensive MRSA colonization surveillance program. It is not intended to diagnose MRSA infection nor to guide or monitor treatment for MRSA infections. Test performance is not FDA approved in patients less than 21 years old. Performed at [facility], [address] [place], [place] [address]   Gastrointestinal Panel by PCR , Stool     Status: None   Collection Time: 09/13/23  9:39 PM   Specimen: STOOL  Result Value Ref Range Status   Campylobacter species NOT DETECTED NOT DETECTED Final    Plesimonas shigelloides NOT DETECTED NOT DETECTED Final   Salmonella species NOT DETECTED NOT DETECTED Final   Yersinia enterocolitica  NOT DETECTED NOT DETECTED Final   Vibrio species NOT DETECTED NOT DETECTED Final   Vibrio cholerae NOT DETECTED NOT DETECTED Final   Enteroaggregative E coli (EAEC) NOT DETECTED NOT DETECTED Final   Enteropathogenic E coli (EPEC) NOT DETECTED NOT DETECTED Final   Enterotoxigenic E coli (ETEC) NOT DETECTED NOT DETECTED Final   Shiga like toxin producing E coli (STEC) NOT DETECTED NOT DETECTED Final   Shigella/Enteroinvasive E coli (EIEC) NOT DETECTED NOT DETECTED Final   Cryptosporidium NOT DETECTED NOT DETECTED Final   Cyclospora cayetanensis NOT DETECTED NOT DETECTED Final   Entamoeba histolytica NOT DETECTED NOT DETECTED Final   Giardia lamblia NOT DETECTED NOT DETECTED Final   Adenovirus F40/41 NOT DETECTED NOT DETECTED Final   Astrovirus NOT DETECTED NOT DETECTED Final   Norovirus GI/GII NOT DETECTED NOT DETECTED Final   Rotavirus [name] NOT DETECTED NOT DETECTED Final   Sapovirus (I, II, IV, and V) NOT DETECTED NOT DETECTED Final    Comment: Performed at [facility], [address] [place], [place] [address]  C Difficile Quick Screen (NO PCR Reflex)     Status: Abnormal   Collection Time: 09/13/23  9:40 PM   Specimen: STOOL  Result Value Ref Range Status   C Diff antigen POSITIVE ([name]) NEGATIVE Final   C Diff toxin NEGATIVE NEGATIVE Final   C Diff interpretation   Final    Results are indeterminate. Please contact the provider listed for your campus for C diff questions in AMION.    Comment: Performed at [facility], [address] [place], [place] [address]  Culture, blood (Routine X 2) w Reflex to ID Panel     Status: None (Preliminary result)   Collection Time: 09/13/23  9:56 PM   Specimen: BLOOD  Result Value Ref Range Status   Specimen Description BLOOD BLOOD LEFT ARM  Final   Special Requests   Final    BOTTLES DRAWN  AEROBIC AND ANAEROBIC Blood Culture adequate volume   Culture   Final    NO GROWTH 2 DAYS Performed at [facility], [address] [place], [place] [address]    Report Status PENDING  Incomplete  Culture, blood (Routine X 2) w Reflex to ID Panel     Status: None (Preliminary result)   Collection Time: 09/13/23  9:57 PM   Specimen: BLOOD LEFT HAND  Result Value Ref Range Status   Specimen Description BLOOD LEFT HAND  Final   Special Requests   Final    BOTTLES DRAWN AEROBIC AND ANAEROBIC Blood Culture adequate volume   Culture   Final    NO GROWTH 2 DAYS Performed at [facility], [address] [place], [place] [address]    Report Status PENDING  Incomplete  C. Diff by PCR     Status: Abnormal   Collection Time: 09/14/23  2:40 PM   Specimen: STOOL  Result Value Ref Range Status   Toxigenic C. Difficile by PCR POSITIVE ([name]) NEGATIVE Final    Comment: Positive for toxigenic C. difficile with little to no toxin production. Only treat if clinical presentation suggests symptomatic illness. Performed at [facility], [address], [place], [place] [address]          Radiology Studies: VAS [place] MESENTERIC  Result Date: 09/15/2023 ABDOMINAL VISCERAL Patient Name:  [name]  Date of Exam:   09/15/2023 Medical Rec #: [id number]        Accession #:  [id number] Date of Birth: 07-14-1946        Patient Gender: F Patient Age:   4 years Exam Location:  [facility] Procedure:      VAS [place] MESENTERIC Referring Phys: [id number] [facility] -------------------------------------------------------------------------------- Indications: Evaluate for mesenteric ischemia. Chronic history of colitis. High Risk Factors: Hypertension, hyperlipidemia, Diabetes. Other Factors: Status post appendectomy 3 weeks ago.                Fibromyalgia. Comparison Study: No priors. Performing Technologist: [name] RDMS, RVT  Examination Guidelines: Sheppard Luckenbach complete evaluation includes  B-mode imaging, spectral Doppler, color Doppler, and power Doppler as needed of all accessible portions of each vessel. Bilateral testing is considered an integral part of Donovan Persley complete examination. Limited examinations for reoccurring indications may be performed as noted.  Duplex Findings: +----------------------+--------+--------+----------+-------------------+ Mesenteric            PSV cm/sEDV cm/s  Plaque       Comments       +----------------------+--------+--------+----------+-------------------+ Aorta Distal            135                                         +----------------------+--------+--------+----------+-------------------+ Celiac Artery Origin    458           hypoechoic                    +----------------------+--------+--------+----------+-------------------+ Celiac Artery Proximal  299                                         +----------------------+--------+--------+----------+-------------------+ SMA Origin              295                     not well visualized +----------------------+--------+--------+----------+-------------------+ SMA Proximal            356           hypoechoic                    +----------------------+--------+--------+----------+-------------------+ SMA Mid                 205                                         +----------------------+--------+--------+----------+-------------------+ SMA Distal              216                                         +----------------------+--------+--------+----------+-------------------+ IMA                     208                                         +----------------------+--------+--------+----------+-------------------+  Mesenteric Technologist observations: Elevated velocities suggest low end of range ( 70-99%) in the SMA.   Summary: Mesenteric: 70 to  99% stenosis in the superior mesenteric artery and celiac artery. .  *See table(s) above for measurements and  observations.     Preliminary         Scheduled Meds:  amoxicillin-clavulanate  1 tablet Oral Q12H   aspirin  81 mg Oral Daily   enoxaparin (LOVENOX) injection  40 mg Subcutaneous Q24H   insulin aspart  0-9 Units Subcutaneous TID WC   lisinopril  10 mg Oral Daily   nebivolol  5 mg Oral Daily   rosuvastatin  20 mg Oral Daily   ticagrelor  90 mg Oral BID   vancomycin  125 mg Oral QID   Continuous Infusions:  lactated ringers 75 mL/hr at 09/15/23 1242     LOS: 6 days    Time spent: over 30 min    Lacretia Nicks, MD [facility]   To contact the attending provider between 7A-7P or the covering provider during after hours 7P-7A, please log into the web site [email or web address] and access using universal [place] password for that web site. If you do not have the password, please call the hospital operator.  09/15/2023, 4:21 PM

## 2023-09-16 ENCOUNTER — Other Ambulatory Visit (HOSPITAL_COMMUNITY): Payer: Self-pay

## 2023-09-16 DIAGNOSIS — G459 Transient cerebral ischemic attack, unspecified: Secondary | ICD-10-CM | POA: Diagnosis not present

## 2023-09-16 LAB — COMPREHENSIVE METABOLIC PANEL
ALT: 26 U/L (ref 0–44)
AST: 29 U/L (ref 15–41)
Albumin: 2.8 g/dL — ABNORMAL LOW (ref 3.5–5.0)
Alkaline Phosphatase: 62 U/L (ref 38–126)
Anion gap: 8 (ref 5–15)
BUN: 5 mg/dL — ABNORMAL LOW (ref 8–23)
CO2: 21 mmol/L — ABNORMAL LOW (ref 22–32)
Calcium: 8.4 mg/dL — ABNORMAL LOW (ref 8.9–10.3)
Chloride: 108 mmol/L (ref 98–111)
Creatinine, Ser: 0.58 mg/dL (ref 0.44–1.00)
GFR, Estimated: >60 mL/min (ref >60)
Glucose, Bld: 115 mg/dL — ABNORMAL HIGH (ref 70–99)
Potassium: 4.3 mmol/L (ref 3.5–5.1)
Sodium: 137 mmol/L (ref 135–145)
Total Bilirubin: 0.6 mg/dL (ref 0.3–1.2)
Total Protein: 5.4 g/dL — ABNORMAL LOW (ref 6.5–8.1)

## 2023-09-16 LAB — CBC WITH DIFFERENTIAL/PLATELET
Abs Immature Granulocytes: 0.03 10*3/uL (ref 0.00–0.07)
Basophils Absolute: 0.1 10*3/uL (ref 0.0–0.1)
Basophils Relative: 1 %
Eosinophils Absolute: 0.1 10*3/uL (ref 0.0–0.5)
Eosinophils Relative: 3 %
HCT: 32.2 % — ABNORMAL LOW (ref 36.0–46.0)
Hemoglobin: 9.9 g/dL — ABNORMAL LOW (ref 12.0–15.0)
Immature Granulocytes: 1 %
Lymphocytes Relative: 35 %
Lymphs Abs: 1.5 10*3/uL (ref 0.7–4.0)
MCH: 27.4 pg (ref 26.0–34.0)
MCHC: 30.7 g/dL (ref 30.0–36.0)
MCV: 89.2 fL (ref 80.0–100.0)
Monocytes Absolute: 0.5 10*3/uL (ref 0.1–1.0)
Monocytes Relative: 12 %
Neutro Abs: 2 10*3/uL (ref 1.7–7.7)
Neutrophils Relative %: 48 %
Platelets: 251 10*3/uL (ref 150–400)
RBC: 3.61 MIL/uL — ABNORMAL LOW (ref 3.87–5.11)
RDW: 17.9 % — ABNORMAL HIGH (ref 11.5–15.5)
WBC: 4.2 10*3/uL (ref 4.0–10.5)
nRBC: 0 % (ref 0.0–0.2)

## 2023-09-16 LAB — PHOSPHORUS: Phosphorus: 2.9 mg/dL (ref 2.5–4.6)

## 2023-09-16 LAB — GLUCOSE, CAPILLARY
Glucose-Capillary: 104 mg/dL — ABNORMAL HIGH (ref 70–99)
Glucose-Capillary: 158 mg/dL — ABNORMAL HIGH (ref 70–99)
Glucose-Capillary: 83 mg/dL (ref 70–99)
Glucose-Capillary: 142 mg/dL — ABNORMAL HIGH (ref 70–99)

## 2023-09-16 LAB — MAGNESIUM: Magnesium: 2.1 mg/dL (ref 1.7–2.4)

## 2023-09-16 MED ORDER — TRAZODONE HCL 50 MG PO TABS
50.0000 mg | ORAL_TABLET | Freq: Every day | ORAL | Status: DC
  Administered 2023-09-18: 50 mg via ORAL
  Filled 2023-09-16 (×4): qty 1

## 2023-09-16 MED ORDER — ALPRAZOLAM 0.5 MG PO TABS
0.5000 mg | ORAL_TABLET | Freq: Every day | ORAL | Status: DC | PRN
  Administered 2023-09-16 – 2023-09-20 (×4): 0.5 mg via ORAL
  Filled 2023-09-16 (×4): qty 1

## 2023-09-16 MED ORDER — HYDROXYZINE HCL 25 MG PO TABS
25.0000 mg | ORAL_TABLET | Freq: Three times a day (TID) | ORAL | Status: DC | PRN
  Administered 2023-09-16 – 2023-09-19 (×3): 25 mg via ORAL
  Filled 2023-09-16 (×4): qty 1

## 2023-09-16 NOTE — Plan of Care (Signed)

## 2023-09-16 NOTE — Progress Notes (Signed)
Physical Therapy Treatment Patient Details Name: [name] MRN: [id number] DOB: 07-Sep-1946 Today's Date: 09/16/2023   History of Present Illness 77 y.o. female presents to [facility] ED 9/27 with nausea/vomiting and mild right sided numbness, greatest in right lower limb, as well as AMS. 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 Approximately 50% stenosis of the proximal right ICA.   With history of diverticulitis, hypertension, hyperlipidemia, myalgias/myositis/fibromyalgia, carotid artery stenosis, right ICA stenosis status post stenting, former alcohol use, and an appendectomy 2 weeks ago.    PT Comments  Tolerated treatment well today. Mod I with bed mobility, supervision for safety with transfers from multiple surfaces and gait (using IV pole mostly, declines SPC.) No overt LOB or buckling. Cues for upright posture. Pt reports feeling closer to baseline again. Awaiting surgery. Patient will continue to benefit from skilled physical therapy services to further improve independence with functional mobility.     If plan is discharge home, recommend the following: A little help with walking and/or transfers;A little help with bathing/dressing/bathroom;Assistance with cooking/housework;Assist for transportation;Help with stairs or ramp for entrance   Can travel by private vehicle        Equipment Recommendations  None recommended by PT (TBD post-op)    Recommendations for Other Services       Precautions / Restrictions Precautions Precautions: Fall Restrictions Weight Bearing Restrictions: No     Mobility  Bed Mobility Overal bed mobility: Modified Independent Bed Mobility: Supine to Sit, Sit to Supine     Supine to sit: Modified independent (Device/Increase time)     General bed mobility comments: Mod I extra time, assisted with lines/leads only.    Transfers Overall transfer level: Needs  assistance Equipment used: None Transfers: Sit to/from Stand Sit to Stand: Supervision           General transfer comment: supervision for safety, performed from bed and toilet, assisted with lines/leads. Minor instability.    Ambulation/Gait Ambulation/Gait assistance: Supervision Gait Distance (Feet): 95 Feet Assistive device: None, IV Pole Gait Pattern/deviations: Step-through pattern, Decreased stride length, Trunk flexed Gait velocity: decr Gait velocity interpretation: <1.8 ft/sec, indicate of risk for recurrent falls   General Gait Details: Tolerated well today. Prefers holding IV pole but also able to ambualte without support. Supervision for safety. Cues for upright posture, pt feels close to baseline.   Stairs             Wheelchair Mobility     Tilt Bed    Modified Rankin (Stroke Patients Only) Modified Rankin (Stroke Patients Only) Pre-Morbid Rankin Score: No symptoms Modified Rankin: Moderately severe disability     Balance Overall balance assessment: Needs assistance Sitting-balance support: No upper extremity supported, Feet supported Sitting balance-Leahy Scale: Good     Standing balance support: No upper extremity supported, During functional activity Standing balance-Leahy Scale: Fair Standing balance comment: increased sway, reaching for furniture                            Cognition Arousal: Alert Behavior During Therapy: WFL for tasks assessed/performed Overall Cognitive Status: Within Functional Limits for tasks assessed                                          Exercises      General Comments General  comments (skin integrity, edema, etc.): MD came to room and we d/c gait training a little early.      Pertinent Vitals/Pain Pain Assessment Pain Assessment: No/denies pain    Home Living                          Prior Function            PT Goals (current goals can now be found in  the care plan section) Acute Rehab PT Goals Patient Stated Goal: none PT Goal Formulation: With patient Time For Goal Achievement: 09/23/23 Potential to Achieve Goals: Good Progress towards PT goals: Progressing toward goals    Frequency    Min 1X/week      PT Plan      Co-evaluation              AM-PAC PT "6 Clicks" Mobility   Outcome Measure  Help needed turning from your back to your side while in a flat bed without using bedrails?: None Help needed moving from lying on your back to sitting on the side of a flat bed without using bedrails?: None Help needed moving to and from a bed to a chair (including a wheelchair)?: A Little Help needed standing up from a chair using your arms (e.g., wheelchair or bedside chair)?: A Little Help needed to walk in hospital room?: A Little Help needed climbing 3-5 steps with a railing? : A Little 6 Click Score: 20    End of Session Equipment Utilized During Treatment: Gait belt Activity Tolerance: Patient tolerated treatment well Patient left: in bed;with bed alarm set;with call bell/phone within reach;with family/visitor present (MD in room)   PT Visit Diagnosis: Unsteadiness on feet (R26.81);Hemiplegia and hemiparesis;Difficulty in walking, not elsewhere classified (R26.2);Other symptoms and signs involving the nervous system (R29.898) Hemiplegia - Right/Left: Right Hemiplegia - caused by: Unspecified     Time: 1211-1226 PT Time Calculation (min) (ACUTE ONLY): 15 min  Charges:    $Gait Training: 8-22 mins PT General Charges $$ ACUTE PT VISIT: 1 Visit                     Kathlyn Sacramento, PT, DPT [facility]  Rehabilitation Services Physical Therapist Office: [phone] Website: [email or web address]    Berton Mount 09/16/2023, 1:12 PM

## 2023-09-16 NOTE — Progress Notes (Signed)
Interventional Radiology Brief Note  Orders in place for patient to be NPO p MN Monday for scheduled for procedure in IR.   Loyce Dys, MS RD PA-C

## 2023-09-16 NOTE — Progress Notes (Signed)
PROGRESS NOTE    [name]  ZOX:[id number] DOB: 27-Feb-1946 DOA: 09/09/2023 PCP: Marguarite Arbour, MD  Chief Complaint  Patient presents with   Emesis    Brief Narrative:   77 y.o. female with medical history significant of HTN, HLD, diverticulosis, DM2, ETOH abuse in remission, right ICA stenosis status post stenting, interim history of admission 8/25/-8/29 for lower gi bleed presumed diverticular with associated hemorrhagic shock which required icu admission while on DAPT.   Patient course further complicated by appendectomy 2 weeks ago. Patient presents  who further interim history of feeling poorly over the last few days with n/v/d and abdominal  pain. However on waking this am around 2 am with  [name] bout of n/v she noted weakness/numbness in right leg. Due to this EMS was called.   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  SCA's not identified. Improved cervical L ICA following stenting.  High grade contralateral proximal R ICA stenosis. - MRI brain without acute ischemia - MRA with poor flow/occlusion of basilar artery tip and R PCA origin  - was on aspirin/plavix prior to admission -> ASA/brilinta in preparation of stenting - will repeat urine culture 10/4 - crestor  - A1c 5.7, LDL 117 - plan is for basilar artery stenting once infection controlled (Dr. Corliss Skains recommends completing this while inpatient)   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 -blood cultures NGTDx2  -transition to augmentin, plan for additional 10 days abx    Mild thickening and inflammation of ileocecal  junction and proximal ascending colon Distal ileitis/colitis (infectious vs inflammatory vs ischemic?) C diff antigen positive, negative toxin -> PCR positive, treating with vancomycin  Negative GI path panel  Appreciate surgery assistance with recent appendectomy 2 weeks ago -> recommending vascular surgery c/s with regards to question of stenoses of mesenteric vessels potentially contributing to presentation  Appreciate GI assistance -> r/o c diff, no endoscopy/colonoscopy currently recommended -> needs outpatient Vascular has been consulted Augmentin    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%  Appreciate vascular recommendations -> follow outpatient    Hypokalemia -Replete  Diabetes mellitus type 2 -Continue sliding scale insulin with NovoLog -at home on trulicity, jardiance, metformin    Recent history of GI bleed -trend Hb/Hct   Hypertension -BP goal 130-160 before basilar artery revascularization -Continue nebivolol, -lisinopril 10 mg was started 10/2 -adjust meds as needed for goal  -Continue as needed IV hydralazine   Anxiety -limit benzo to 5 more doses, once [name] day prn   Diarrhea -She has history of chronic diarrhea -now being treated for c diff as above    DVT prophylaxis: lovenox Code Status: full Family Communication: none Disposition:   Status is: Inpatient Remains inpatient appropriate because: continued need for inpatient care   Consultants:  Neurology Neuro IR Vascular Surgery GI   Procedures:  Echo IMPRESSIONS     1. Left ventricular ejection fraction, by estimation, is 60 to 65%. The  left ventricle has normal function. The left ventricle has no regional  wall motion abnormalities. There is mild left ventricular hypertrophy.  Left ventricular diastolic parameters  are consistent  with Grade I diastolic dysfunction (impaired relaxation).   2. Right ventricular systolic function is normal. The right ventricular  size  is normal. Tricuspid regurgitation signal is inadequate for assessing  PA pressure.   3. Resolution is challenging for bubble study; possible small PFO with [name]  few bubble within 3-5 cardiac cycles.   4. The mitral valve is normal in structure. No evidence of mitral valve  regurgitation.   5. The aortic valve is normal in structure. Aortic valve regurgitation is  not visualized.   6. The inferior vena cava is normal in size with greater than 50%  respiratory variability, suggesting right atrial pressure of 3 mmHg.   Conclusion(s)/Recommendation(s): Possible small PFO; considering his age  and comorbidites stroke etiology is multifactorial. Would not w/u for PFO  closure.   Antimicrobials:  Anti-infectives (From admission, onward)    Start     Dose/Rate Route Frequency Ordered Stop   09/15/23 1400  vancomycin (VANCOCIN) capsule 125 mg  Status:  Discontinued        125 mg Oral 4 times daily 09/15/23 1218 09/15/23 1219   09/15/23 1400  vancomycin (VANCOCIN) capsule 125 mg        125 mg Oral 4 times daily 09/15/23 1219 10/05/23 1359   09/14/23 1445  amoxicillin-clavulanate (AUGMENTIN) 875-125 MG per tablet 1 tablet        1 tablet Oral Every 12 hours 09/14/23 1354 09/24/23 0959   09/10/23 1030  piperacillin-tazobactam (ZOSYN) IVPB 3.375 g  Status:  Discontinued        3.375 g 12.5 mL/hr over 240 Minutes Intravenous Every 8 hours 09/10/23 0943 09/14/23 1354       Subjective: Asking about xanax again  Objective: Vitals:   09/16/23 0917 09/16/23 1156 09/16/23 1604 09/16/23 2013  BP: (!) 183/66 (!) 128/53 137/64 (!) 147/63  Pulse:  60 62 71  Resp:  18 18 16   Temp:  98.3 F (36.8 C) 98.3 F (36.8 C) 98.4 F (36.9 C)  TempSrc:  Oral Oral Oral  SpO2:  98% 96% 99%  Weight:      Height:        Intake/Output Summary (Last 24 hours) at 09/16/2023 2016 Last data filed at 09/16/2023 0900 Gross per 24 hour  Intake 1036.5 ml  Output 650 ml  Net 386.5 ml   Filed Weights   09/09/23  0410  Weight: 65.8 kg    Examination:  General: No acute distress. Cardiovascular: RRR Lungs: unlabored S/NT/ND Neurological: Alert and oriented 3. Moves all extremities 4 with equal strength. Cranial nerves II through XII grossly intact. Extremities: No clubbing or cyanosis. No edema.   Data Reviewed: I have personally reviewed following labs and imaging studies  CBC: Recent Labs  Lab 09/12/23 0746 09/13/23 0623 09/14/23 1456 09/15/23 0745 09/16/23 0703  WBC 5.0 4.3 4.8 4.0 4.2  NEUTROABS  --   --  2.6 2.2 2.0  HGB 10.2* 9.6* 10.3* 9.8* 9.9*  HCT 32.2* 31.2* 33.3* 31.4* 32.2*  MCV 86.1 88.1 90.2 90.0 89.2  PLT 309 295 304 254 251    Basic Metabolic Panel: Recent Labs  Lab 09/12/23 0746 09/13/23 0623 09/14/23 1456 09/15/23 0745 09/16/23 0703  NA 139 138 136 135 137  K 3.4* 4.5 3.8 3.1* 4.3  CL 101 107 105 103 108  CO2 21* 25 24 23  21*  GLUCOSE 131* 105* 102* 113* 115*  BUN 8 6* 6* 5* 5*  CREATININE 0.81 0.86 0.77 0.83 0.58  CALCIUM 9.0 8.9  8.6* 8.3* 8.4*  MG  --   --   --  1.4* 2.1  PHOS  --   --   --  2.9 2.9    GFR: Estimated Creatinine Clearance: 54.1 mL/min (by C-G formula based on SCr of 0.58 mg/dL).  Liver Function Tests: Recent Labs  Lab 09/11/23 1134 09/12/23 0746 09/14/23 1456 09/15/23 0745 09/16/23 0703  AST 17 17 33 26 29  ALT 13 18 26 26 26   ALKPHOS 68 71 68 61 62  BILITOT 0.5 0.8 0.6 0.3 0.6  PROT 5.4* 5.9* 5.9* 5.3* 5.4*  ALBUMIN 2.6* 2.8* 3.0* 2.6* 2.8*    CBG: Recent Labs  Lab 09/15/23 1603 09/15/23 2130 09/16/23 0612 09/16/23 1237 09/16/23 1652  GLUCAP 136* 130* 104* 158* 83     Recent Results (from the past 240 hour(s))  Urine Culture     Status: Abnormal   Collection Time: 09/09/23  6:30 PM   Specimen: Urine, Clean Catch  Result Value Ref Range Status   Specimen Description URINE, CLEAN CATCH  Final   Special Requests   Final    NONE Reflexed from [id number] Performed at [facility], [address], [place], [place] [address]    Culture >=100,000 COLONIES/mL KLEBSIELLA OXYTOCA ([name])  Final   Report Status 09/11/2023 FINAL  Final   Organism ID, Bacteria KLEBSIELLA OXYTOCA ([name])  Final      Susceptibility   Klebsiella oxytoca - MIC*    AMPICILLIN >=32 RESISTANT Resistant     CEFEPIME <=0.12 SENSITIVE Sensitive     CEFTRIAXONE <=0.25 SENSITIVE Sensitive     CIPROFLOXACIN <=0.25 SENSITIVE Sensitive     GENTAMICIN <=1 SENSITIVE Sensitive     IMIPENEM <=0.25 SENSITIVE Sensitive     NITROFURANTOIN 32 SENSITIVE Sensitive     TRIMETH/SULFA <=20 SENSITIVE Sensitive     AMPICILLIN/SULBACTAM 8 SENSITIVE Sensitive     PIP/TAZO <=4 SENSITIVE Sensitive     * >=100,000 COLONIES/mL KLEBSIELLA OXYTOCA  MRSA Next Gen by PCR, Nasal     Status: Abnormal   Collection Time: 09/09/23  8:05 PM   Specimen: Nasal Mucosa; Nasal Swab  Result Value Ref Range Status   MRSA by PCR Next Gen DETECTED ([name]) NOT DETECTED Final    Comment: RESULT CALLED TO, READ BACK BY AND VERIFIED WITH: F OKASOR,RN@[id number]  09/10/23 MK (NOTE) The GeneXpert MRSA Assay (FDA approved for NASAL specimens only), is one component of [name] comprehensive MRSA colonization surveillance program. It is not intended to diagnose MRSA infection nor to guide or monitor treatment for MRSA infections. Test performance is not FDA approved in patients less than 41 years old. Performed at [facility], [address], [place], [place] [address]   Gastrointestinal Panel by PCR , Stool     Status: None   Collection Time: 09/13/23  9:39 PM   Specimen: STOOL  Result Value Ref Range Status   Campylobacter species NOT DETECTED NOT DETECTED Final   Plesimonas shigelloides NOT DETECTED NOT DETECTED Final   Salmonella species NOT DETECTED NOT DETECTED Final   Yersinia enterocolitica NOT DETECTED NOT DETECTED Final   Vibrio species NOT DETECTED NOT DETECTED Final   Vibrio cholerae NOT DETECTED NOT DETECTED Final   Enteroaggregative E coli (EAEC) NOT  DETECTED NOT DETECTED Final   Enteropathogenic E coli (EPEC) NOT DETECTED NOT DETECTED Final   Enterotoxigenic E coli (ETEC) NOT DETECTED NOT DETECTED Final   Shiga like toxin producing E coli (STEC) NOT DETECTED NOT DETECTED Final  Shigella/Enteroinvasive E coli (EIEC) NOT DETECTED NOT DETECTED Final   Cryptosporidium NOT DETECTED NOT DETECTED Final   Cyclospora cayetanensis NOT DETECTED NOT DETECTED Final   Entamoeba histolytica NOT DETECTED NOT DETECTED Final   Giardia lamblia NOT DETECTED NOT DETECTED Final   Adenovirus F40/41 NOT DETECTED NOT DETECTED Final   Astrovirus NOT DETECTED NOT DETECTED Final   Norovirus GI/GII NOT DETECTED NOT DETECTED Final   Rotavirus [name] NOT DETECTED NOT DETECTED Final   Sapovirus (I, II, IV, and V) NOT DETECTED NOT DETECTED Final    Comment: Performed at [name], [address] [place], [place] [address]  C Difficile Quick Screen (NO PCR Reflex)     Status: Abnormal   Collection Time: 09/13/23  9:40 PM   Specimen: STOOL  Result Value Ref Range Status   C Diff antigen POSITIVE ([name]) NEGATIVE Final   C Diff toxin NEGATIVE NEGATIVE Final   C Diff interpretation   Final    Results are indeterminate. Please contact the provider listed for your campus for C diff questions in AMION.    Comment: Performed at [name], [address], [place], [place] [address]  Culture, blood (Routine X 2) w Reflex to ID Panel     Status: None (Preliminary result)   Collection Time: 09/13/23  9:56 PM   Specimen: BLOOD  Result Value Ref Range Status   Specimen Description BLOOD BLOOD LEFT ARM  Final   Special Requests   Final    BOTTLES DRAWN AEROBIC AND ANAEROBIC Blood Culture adequate volume   Culture   Final    NO GROWTH 3 DAYS Performed at [name], [address], [place], [place] [address]    Report Status PENDING  Incomplete  Culture, blood (Routine X 2) w Reflex to ID Panel     Status: None (Preliminary result)   Collection Time:  09/13/23  9:57 PM   Specimen: BLOOD LEFT HAND  Result Value Ref Range Status   Specimen Description BLOOD LEFT HAND  Final   Special Requests   Final    BOTTLES DRAWN AEROBIC AND ANAEROBIC Blood Culture adequate volume   Culture   Final    NO GROWTH 3 DAYS Performed at [name], [address] [place], [place] [address]    Report Status PENDING  Incomplete  C. Diff by PCR     Status: Abnormal   Collection Time: 09/14/23  2:40 PM   Specimen: STOOL  Result Value Ref Range Status   Toxigenic C. Difficile by PCR POSITIVE ([name]) NEGATIVE Final    Comment: Positive for toxigenic C. difficile with little to no toxin production. Only treat if clinical presentation suggests symptomatic illness. Performed at [name], [address], [place], [place] [address]          Radiology Studies: VAS [place] MESENTERIC  Result Date: 09/15/2023 ABDOMINAL VISCERAL Patient Name:  DENE LANDSBERG  Date of Exam:   09/15/2023 Medical Rec #: [id number]        Accession #:    [id number] Date of Birth: 11/03/1946        Patient Gender: F Patient Age:   33 years Exam Location:  [facility] Procedure:      VAS [place] MESENTERIC Referring Phys: [id number] [name] [name] -------------------------------------------------------------------------------- Indications: Evaluate for mesenteric ischemia. Chronic history of colitis. High Risk Factors: Hypertension, hyperlipidemia, Diabetes. Other Factors: Status post appendectomy 3 weeks ago.  Fibromyalgia. Comparison Study: No priors. Performing Technologist: Marilynne Halsted RDMS, RVT  Examination Guidelines: Jamae Tison complete evaluation includes B-mode imaging, spectral Doppler, color Doppler, and power Doppler as needed of all accessible portions of each vessel. Bilateral testing is considered an integral part of Zavior Thomason complete examination. Limited examinations for reoccurring indications may be performed as noted.  Duplex Findings:  +----------------------+--------+--------+----------+-------------------+ Mesenteric            PSV cm/sEDV cm/s  Plaque       Comments       +----------------------+--------+--------+----------+-------------------+ Aorta Distal            135                                         +----------------------+--------+--------+----------+-------------------+ Celiac Artery Origin    458           hypoechoic                    +----------------------+--------+--------+----------+-------------------+ Celiac Artery Proximal  299                                         +----------------------+--------+--------+----------+-------------------+ SMA Origin              295                     not well visualized +----------------------+--------+--------+----------+-------------------+ SMA Proximal            356           hypoechoic                    +----------------------+--------+--------+----------+-------------------+ SMA Mid                 205                                         +----------------------+--------+--------+----------+-------------------+ SMA Distal              216                                         +----------------------+--------+--------+----------+-------------------+ IMA                     208                                         +----------------------+--------+--------+----------+-------------------+  Mesenteric Technologist observations: Elevated velocities suggest low end of range ( 70-99%) in the SMA.  Summary: Mesenteric: 70 to 99% stenosis in the superior mesenteric artery and celiac artery. .  *See table(s) above for measurements and observations.  Diagnosing physician: Carolynn Sayers  Electronically signed by Carolynn Sayers on 09/15/2023 at 6:53:48 PM.    Final         Scheduled Meds:  amoxicillin-clavulanate  1 tablet Oral Q12H   aspirin  81 mg Oral Daily   enoxaparin (LOVENOX) injection  40 mg Subcutaneous Q24H    insulin aspart  0-9 Units  Subcutaneous TID WC   lisinopril  10 mg Oral Daily   nebivolol  5 mg Oral Daily   rosuvastatin  20 mg Oral Daily   ticagrelor  90 mg Oral BID   traZODone  50 mg Oral QHS   vancomycin  125 mg Oral QID   Continuous Infusions:  lactated ringers 75 mL/hr at 09/16/23 0757     LOS: 7 days    Time spent: over 30 min    Lacretia Nicks, MD [facility]   To contact the attending provider between 7A-7P or the covering provider during after hours 7P-7A, please log into the web site [email or web address] and access using universal [place] password for that web site. If you do not have the password, please call the hospital operator.  09/16/2023, 8:16 PM

## 2023-09-16 NOTE — TOC Benefit Eligibility Note (Signed)
Patient Product/process development scientist completed.    The patient is insured through [employer]. Patient has Medicare and is not eligible for a copay card, but may be able to apply for patient assistance, if available.    Ran test claim for vancomycin 125 mg and the current 14 day co-pay is $0.00.   This test claim was processed through [facility]- copay amounts may vary at other pharmacies due to pharmacy/plan contracts, or as the patient moves through the different stages of their insurance plan.     Roland Earl, CPHT Pharmacy Technician III Certified Patient Advocate [facility] Patient Advocate Team Direct Number: [phone]  Fax: [phone]

## 2023-09-17 DIAGNOSIS — G459 Transient cerebral ischemic attack, unspecified: Secondary | ICD-10-CM | POA: Diagnosis not present

## 2023-09-17 LAB — GLUCOSE, CAPILLARY
Glucose-Capillary: 96 mg/dL (ref 70–99)
Glucose-Capillary: 149 mg/dL — ABNORMAL HIGH (ref 70–99)
Glucose-Capillary: 91 mg/dL (ref 70–99)
Glucose-Capillary: 119 mg/dL — ABNORMAL HIGH (ref 70–99)

## 2023-09-17 LAB — URINE CULTURE: Culture: NO GROWTH

## 2023-09-17 NOTE — Progress Notes (Signed)
PROGRESS NOTE    [name]  ZOX:[id number] DOB: 1946/09/18 DOA: 09/09/2023 PCP: Charlene Arbour, MD  Chief Complaint  Patient presents with   Emesis    Brief Narrative:   77 y.o. female with medical history significant of HTN, HLD, diverticulosis, DM2, ETOH abuse in remission, right ICA stenosis status post stenting, interim history of admission 8/25/-8/29 for lower gi bleed presumed diverticular with associated hemorrhagic shock which required icu admission while on DAPT.   Patient course further complicated by appendectomy 2 weeks ago. Patient presents  who further interim history of feeling poorly over the last few days with n/v/d and abdominal  pain. However on waking this am around 2 am with  [name] bout of n/v she noted weakness/numbness in right leg. Due to this EMS was called.   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  SCA's not identified. Improved cervical L ICA following stenting.  High grade contralateral proximal R ICA stenosis. - MRI brain without acute ischemia - MRA with poor flow/occlusion of basilar artery tip and R PCA origin  - was on aspirin/plavix prior to admission -> ASA/brilinta in preparation of stenting - will repeat urine culture 10/4 - no growth - crestor  - A1c 5.7, LDL 117 - plan is for basilar artery stenting once infection controlled (Dr. Corliss [name] recommends completing this while inpatient)   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 -blood cultures NGTDx2  -transition to augmentin, plan for additional 10 days abx    Mild thickening and inflammation  of ileocecal junction and proximal ascending colon Distal ileitis/colitis (infectious vs inflammatory vs ischemic?) C diff antigen positive, negative toxin -> PCR positive, treating with vancomycin  Negative GI path panel  Appreciate surgery assistance with recent appendectomy 2 weeks ago -> recommending vascular surgery c/s with regards to question of stenoses of mesenteric vessels potentially contributing to presentation  Appreciate GI assistance -> r/o c diff, no endoscopy/colonoscopy currently recommended -> needs outpatient Vascular has been consulted    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%  Appreciate vascular recommendations -> follow outpatient    Hypokalemia -Replete  Diabetes mellitus type 2 -Continue sliding scale insulin with NovoLog -at home on trulicity, jardiance, metformin    Recent history of GI bleed -trend Hb/Hct   Hypertension -BP goal 130-160 before basilar artery revascularization -Continue nebivolol, -lisinopril 10 mg was started 10/2 -adjust meds as needed for goal  -Continue as needed IV hydralazine   Anxiety -limit benzo to 5 more doses, once [name] day prn   Diarrhea -She has history of chronic diarrhea -now being treated for c diff as above    DVT prophylaxis: lovenox Code Status: full Family Communication: none Disposition:   Status is: Inpatient Remains inpatient appropriate because: continued need for inpatient care   Consultants:  Neurology Neuro IR Vascular Surgery GI   Procedures:  Echo IMPRESSIONS     1. Left ventricular ejection fraction, by estimation, is 60 to 65%. The  left ventricle has normal function. The left ventricle has no regional  wall motion abnormalities. There is mild left ventricular hypertrophy.  Left ventricular diastolic parameters  are consistent with Grade I diastolic dysfunction (impaired relaxation).   2. Right ventricular systolic function is normal. The right ventricular   size is normal. Tricuspid regurgitation signal is inadequate for assessing  PA pressure.   3. Resolution is challenging for bubble study; possible small PFO with [name]  few bubble within 3-5 cardiac cycles.   4. The mitral valve is normal in structure. No evidence of mitral valve  regurgitation.   5. The aortic valve is normal in structure. Aortic valve regurgitation is  not visualized.   6. The inferior vena cava is normal in size with greater than 50%  respiratory variability, suggesting right atrial pressure of 3 mmHg.   Conclusion(s)/Recommendation(s): Possible small PFO; considering his age  and comorbidites stroke etiology is multifactorial. Would not w/u for PFO  closure.   Antimicrobials:  Anti-infectives (From admission, onward)    Start     Dose/Rate Route Frequency Ordered Stop   09/15/23 1400  vancomycin (VANCOCIN) capsule 125 mg  Status:  Discontinued        125 mg Oral 4 times daily 09/15/23 1218 09/15/23 1219   09/15/23 1400  vancomycin (VANCOCIN) capsule 125 mg        125 mg Oral 4 times daily 09/15/23 1219 10/05/23 1359   09/14/23 1445  amoxicillin-clavulanate (AUGMENTIN) 875-125 MG per tablet 1 tablet        1 tablet Oral Every 12 hours 09/14/23 1354 09/24/23 0959   09/10/23 1030  piperacillin-tazobactam (ZOSYN) IVPB 3.375 g  Status:  Discontinued        3.375 g 12.5 mL/hr over 240 Minutes Intravenous Every 8 hours 09/10/23 0943 09/14/23 1354       Subjective: No new complaints  Objective: Vitals:   09/17/23 0401 09/17/23 0719 09/17/23 1220 09/17/23 1500  BP: (!) 140/59 (!) 171/65 (!) 140/48 (!) 153/64  Pulse: 61 76 62 75  Resp: 18 18 18 16   Temp: 98 F (36.7 C) 98.5 F (36.9 C) 97.8 F (36.6 C) 98 F (36.7 C)  TempSrc: Oral Oral Oral Oral  SpO2: 96% 95% 100% 100%  Weight:      Height:        Intake/Output Summary (Last 24 hours) at 09/17/2023 1617 Last data filed at 09/17/2023 0708 Gross per 24 hour  Intake --  Output 1 ml  Net -1 ml   Filed  Weights   09/09/23 0410  Weight: 65.8 kg    Examination:  General: No acute distress. Lungs: unlabored Abdomen: Soft, nontender, nondistended w Neurological: Alert and oriented 3. Moves all extremities 4 with equal strength. Cranial nerves II through XII grossly intact. Extremities: No clubbing or cyanosis. No edema Data Reviewed: I have personally reviewed following labs and imaging studies  CBC: Recent Labs  Lab 09/12/23 0746 09/13/23 0623 09/14/23 1456 09/15/23 0745 09/16/23 0703  WBC 5.0 4.3 4.8 4.0 4.2  NEUTROABS  --   --  2.6 2.2 2.0  HGB 10.2* 9.6* 10.3* 9.8* 9.9*  HCT 32.2* 31.2* 33.3* 31.4* 32.2*  MCV 86.1 88.1 90.2 90.0 89.2  PLT 309 295 304 254 251    Basic Metabolic Panel: Recent Labs  Lab 09/12/23 0746 09/13/23 0623 09/14/23 1456 09/15/23 0745 09/16/23 0703  NA 139 138 136 135 137  K 3.4* 4.5 3.8 3.1* 4.3  CL 101 107 105 103 108  CO2 21* 25 24 23  21*  GLUCOSE 131* 105* 102* 113* 115*  BUN 8 6* 6* 5* 5*  CREATININE 0.81 0.86 0.77 0.83 0.58  CALCIUM 9.0 8.9 8.6* 8.3* 8.4*  MG  --   --   --  1.4* 2.1  PHOS  --   --   --  2.9 2.9    GFR: Estimated Creatinine Clearance: 54.1 mL/min (by C-G formula based on SCr of 0.58 mg/dL).  Liver Function Tests: Recent Labs  Lab 09/11/23 1134 09/12/23 0746 09/14/23 1456 09/15/23 0745 09/16/23 0703  AST 17 17 33 26 29  ALT 13 18 26 26 26   ALKPHOS 68 71 68 61 62  BILITOT 0.5 0.8 0.6 0.3 0.6  PROT 5.4* 5.9* 5.9* 5.3* 5.4*  ALBUMIN 2.6* 2.8* 3.0* 2.6* 2.8*    CBG: Recent Labs  Lab 09/16/23 1237 09/16/23 1652 09/16/23 2149 09/17/23 0603 09/17/23 1216  GLUCAP 158* 83 142* 96 149*     Recent Results (from the past 240 hour(s))  Urine Culture     Status: Abnormal   Collection Time: 09/09/23  6:30 PM   Specimen: Urine, Clean Catch  Result Value Ref Range Status   Specimen Description URINE, CLEAN CATCH  Final   Special Requests   Final    NONE Reflexed from [id number] Performed at [facility], [address], [place], [place] [address]    Culture >=100,000 COLONIES/mL KLEBSIELLA OXYTOCA (Maicee Ullman)  Final   Report Status 09/11/2023 FINAL  Final   Organism ID, Bacteria KLEBSIELLA OXYTOCA (Shanda Cadotte)  Final      Susceptibility   Klebsiella oxytoca - MIC*    AMPICILLIN >=32 RESISTANT Resistant     CEFEPIME <=0.12 SENSITIVE Sensitive     CEFTRIAXONE <=0.25 SENSITIVE Sensitive     CIPROFLOXACIN <=0.25 SENSITIVE Sensitive     GENTAMICIN <=1 SENSITIVE Sensitive     IMIPENEM <=0.25 SENSITIVE Sensitive     NITROFURANTOIN 32 SENSITIVE Sensitive     TRIMETH/SULFA <=20 SENSITIVE Sensitive     AMPICILLIN/SULBACTAM 8 SENSITIVE Sensitive     PIP/TAZO <=4 SENSITIVE Sensitive     * >=100,000 COLONIES/mL KLEBSIELLA OXYTOCA  MRSA Next Gen by PCR, Nasal     Status: Abnormal   Collection Time: 09/09/23  8:05 PM   Specimen: Nasal Mucosa; Nasal Swab  Result Value Ref Range Status   MRSA by PCR Next Gen DETECTED (Kathrene Sinopoli) NOT DETECTED Final    Comment: RESULT CALLED TO, READ BACK BY AND VERIFIED WITH: F OKASOR,RN@[id number]  09/10/23 MK (NOTE) The GeneXpert MRSA Assay (FDA approved for NASAL specimens only), is one component of [facility] comprehensive MRSA colonization surveillance program. It is not intended to diagnose MRSA infection nor to guide or monitor treatment for MRSA infections. Test performance is not FDA approved in patients less than 55 years old. Performed at [facility], [address] [place], [place] [address]   Gastrointestinal Panel by PCR , Stool     Status: None   Collection Time: 09/13/23  9:39 PM   Specimen: STOOL  Result Value Ref Range Status   Campylobacter species NOT DETECTED NOT DETECTED Final   Plesimonas shigelloides NOT DETECTED NOT DETECTED Final   Salmonella species NOT DETECTED NOT DETECTED Final   Yersinia enterocolitica NOT DETECTED NOT DETECTED Final   Vibrio species NOT DETECTED NOT DETECTED Final   Vibrio cholerae NOT DETECTED NOT DETECTED Final   Enteroaggregative  E coli (EAEC) NOT DETECTED NOT DETECTED Final   Enteropathogenic E coli (EPEC) NOT DETECTED NOT DETECTED Final   Enterotoxigenic E coli (ETEC) NOT DETECTED NOT DETECTED Final   Shiga like toxin producing E coli (STEC) NOT DETECTED NOT  DETECTED Final   Shigella/Enteroinvasive E coli (EIEC) NOT DETECTED NOT DETECTED Final   Cryptosporidium NOT DETECTED NOT DETECTED Final   Cyclospora cayetanensis NOT DETECTED NOT DETECTED Final   Entamoeba histolytica NOT DETECTED NOT DETECTED Final   Giardia lamblia NOT DETECTED NOT DETECTED Final   Adenovirus F40/41 NOT DETECTED NOT DETECTED Final   Astrovirus NOT DETECTED NOT DETECTED Final   Norovirus GI/GII NOT DETECTED NOT DETECTED Final   Rotavirus Taressa Rauh NOT DETECTED NOT DETECTED Final   Sapovirus (I, II, IV, and V) NOT DETECTED NOT DETECTED Final    Comment: Performed at [facility], [address] [place], [place] [address]  C Difficile Quick Screen (NO PCR Reflex)     Status: Abnormal   Collection Time: 09/13/23  9:40 PM   Specimen: STOOL  Result Value Ref Range Status   C Diff antigen POSITIVE (Nealy Karapetian) NEGATIVE Final   C Diff toxin NEGATIVE NEGATIVE Final   C Diff interpretation   Final    Results are indeterminate. Please contact the provider listed for your campus for C diff questions in AMION.    Comment: Performed at [facility], [address], [place], [place] [address]  Culture, blood (Routine X 2) w Reflex to ID Panel     Status: None (Preliminary result)   Collection Time: 09/13/23  9:56 PM   Specimen: BLOOD  Result Value Ref Range Status   Specimen Description BLOOD BLOOD LEFT ARM  Final   Special Requests   Final    BOTTLES DRAWN AEROBIC AND ANAEROBIC Blood Culture adequate volume   Culture   Final    NO GROWTH 4 DAYS Performed at [facility], [address], [place], [place] [address]    Report Status PENDING  Incomplete  Culture, blood (Routine X 2) w Reflex to ID Panel     Status: None (Preliminary result)    Collection Time: 09/13/23  9:57 PM   Specimen: BLOOD LEFT HAND  Result Value Ref Range Status   Specimen Description BLOOD LEFT HAND  Final   Special Requests   Final    BOTTLES DRAWN AEROBIC AND ANAEROBIC Blood Culture adequate volume   Culture   Final    NO GROWTH 4 DAYS Performed at [facility], [address], [place], [place] [address]    Report Status PENDING  Incomplete  C. Diff by PCR     Status: Abnormal   Collection Time: 09/14/23  2:40 PM   Specimen: STOOL  Result Value Ref Range Status   Toxigenic C. Difficile by PCR POSITIVE (Jashanti Clinkscale) NEGATIVE Final    Comment: Positive for toxigenic C. difficile with little to no toxin production. Only treat if clinical presentation suggests symptomatic illness. Performed at [facility], [address] [place], [place] [address]   Urine Culture (for pregnant, neutropenic or urologic patients or patients with an indwelling urinary catheter)     Status: None   Collection Time: 09/16/23 11:00 AM   Specimen: Urine, Clean Catch  Result Value Ref Range Status   Specimen Description URINE, CLEAN CATCH  Final   Special Requests NONE  Final   Culture   Final    NO GROWTH Performed at [facility], [address], [place], [place] [address]    Report Status 09/17/2023 FINAL  Final         Radiology Studies: No results found.      Scheduled Meds:  amoxicillin-clavulanate  1 tablet  Oral Q12H   aspirin  81 mg Oral Daily   enoxaparin (LOVENOX) injection  40 mg Subcutaneous Q24H   insulin aspart  0-9 Units Subcutaneous TID WC   lisinopril  10 mg Oral Daily   nebivolol  5 mg Oral Daily   rosuvastatin  20 mg Oral Daily   ticagrelor  90 mg Oral BID   traZODone  50 mg Oral QHS   vancomycin  125 mg Oral QID   Continuous Infusions:  lactated ringers 75 mL/hr at 09/17/23 1017     LOS: 8 days    Time spent: over 30 min    Lacretia Nicks, MD [facility]   To contact the attending provider between  7A-7P or the covering provider during after hours 7P-7A, please log into the web site [email or web address] and access using universal [place] password for that web site. If you do not have the password, please call the hospital operator.  09/17/2023, 4:17 PM

## 2023-09-18 DIAGNOSIS — G459 Transient cerebral ischemic attack, unspecified: Secondary | ICD-10-CM | POA: Diagnosis not present

## 2023-09-18 LAB — GLUCOSE, CAPILLARY
Glucose-Capillary: 111 mg/dL — ABNORMAL HIGH (ref 70–99)
Glucose-Capillary: 215 mg/dL — ABNORMAL HIGH (ref 70–99)
Glucose-Capillary: 149 mg/dL — ABNORMAL HIGH (ref 70–99)
Glucose-Capillary: 126 mg/dL — ABNORMAL HIGH (ref 70–99)

## 2023-09-18 LAB — CULTURE, BLOOD (ROUTINE X 2)
Culture: NO GROWTH
Culture: NO GROWTH
Special Requests: ADEQUATE
Special Requests: ADEQUATE

## 2023-09-18 NOTE — Plan of Care (Signed)
  Problem: Education: Goal: Knowledge of General Education information will improve Description: Including pain rating scale, medication(s)/side effects and non-pharmacologic comfort measures Outcome: Progressing   Problem: Clinical Measurements: Goal: Respiratory complications will improve Outcome: Progressing Goal: Cardiovascular complication will be avoided Outcome: Progressing   Problem: Activity: Goal: Risk for activity intolerance will decrease Outcome: Progressing   Problem: Nutrition: Goal: Adequate nutrition will be maintained Outcome: Progressing   Problem: Coping: Goal: Level of anxiety will decrease Outcome: Progressing   Problem: Elimination: Goal: Will not experience complications related to bowel motility Outcome: Progressing   Problem: Skin Integrity: Goal: Risk for impaired skin integrity will decrease Outcome: Progressing

## 2023-09-18 NOTE — Anesthesia Preprocedure Evaluation (Signed)
Anesthesia Evaluation  Patient identified by MRN, date of birth, ID band Patient awake    Reviewed: Allergy & Precautions, NPO status , Patient's Chart, lab work & pertinent test results  History of Anesthesia Complications Negative for: history of anesthetic complications  Airway Mallampati: II  TM Distance: >3 FB Neck ROM: Full    Dental  (+) Dental Advisory Given, Teeth Intact   Pulmonary Current Smoker and Patient abstained from smoking.   Pulmonary exam normal        Cardiovascular hypertension, Pt. on medications and Pt. on home beta blockers + Peripheral Vascular Disease  Normal cardiovascular exam   '24 TTE - EF 60 to 65%. Mild LVH. Grade I diastolic dysfunction (impaired relaxation). Possible small PFO with a few bubble within 3-5 cardiac cycles. No significant valve d/o      Neuro/Psych  PSYCHIATRIC DISORDERS Anxiety Depression    TIA   GI/Hepatic ,GERD  Medicated and Controlled,,(+)     substance abuse  alcohol use  Endo/Other  diabetes, Type 2, Oral Hypoglycemic Agents    Renal/GU negative Renal ROS     Musculoskeletal  (+)  Fibromyalgia -  Abdominal   Peds  Hematology  (+) Blood dyscrasia, anemia  On plavix    Anesthesia Other Findings On GLP-1a, last dose > 7 days ago    Reproductive/Obstetrics                             Anesthesia Physical Anesthesia Plan  ASA: 4  Anesthesia Plan: General   Post-op Pain Management: Minimal or no pain anticipated   Induction: Intravenous  PONV Risk Score and Plan: 2 and Treatment may vary due to age or medical condition, Ondansetron and Dexamethasone  Airway Management Planned: Oral ETT  Additional Equipment: Arterial line  Intra-op Plan:   Post-operative Plan: Extubation in OR  Informed Consent: I have reviewed the patients History and Physical, chart, labs and discussed the procedure including the risks, benefits and  alternatives for the proposed anesthesia with the patient or authorized representative who has indicated his/her understanding and acceptance.     Dental advisory given  Plan Discussed with: CRNA and Anesthesiologist  Anesthesia Plan Comments:        Anesthesia Quick Evaluation

## 2023-09-18 NOTE — Progress Notes (Signed)
PROGRESS NOTE    [name]  VHQ:[id number] DOB: 23-Jul-1946 DOA: 09/09/2023 PCP: Marguarite Arbour, MD  Chief Complaint  Patient presents with   Emesis    Brief Narrative:   77 y.o. female with medical history significant of HTN, HLD, diverticulosis, DM2, ETOH abuse in remission, right ICA stenosis status post stenting, interim history of admission 8/25/-8/29 for lower gi bleed presumed diverticular with associated hemorrhagic shock which required icu admission while on DAPT.   Patient course further complicated by appendectomy 2 weeks ago. Patient presents  who further interim history of feeling poorly over the last few days with n/v/d and abdominal  pain. However on waking this am around 2 am with  [name] bout of n/v she noted weakness/numbness in right leg. Due to this EMS was called.   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  SCA's not identified. Improved cervical L ICA following stenting.  High grade contralateral proximal R ICA stenosis. - MRI brain without acute ischemia - MRA with poor flow/occlusion of basilar artery tip and R PCA origin  - was on aspirin/plavix prior to admission -> ASA/brilinta in preparation of stenting - will repeat urine culture 10/4 - no growth - crestor  - A1c 5.7, LDL 117 - plan is for basilar artery stenting once infection controlled (Dr. Corliss Skains recommends completing this while inpatient) - plan for 10/7 AM   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 -blood cultures NGTDx2  -transition to augmentin, plan for additional 10 days abx    Mild thickening  and inflammation of ileocecal junction and proximal ascending colon Distal ileitis/colitis (infectious vs inflammatory vs ischemic?) C diff antigen positive, negative toxin -> PCR positive, treating with vancomycin  Negative GI path panel  Appreciate surgery assistance with recent appendectomy 2 weeks ago -> recommending vascular surgery c/s with regards to question of stenoses of mesenteric vessels potentially contributing to presentation  Appreciate GI assistance -> r/o c diff, no endoscopy/colonoscopy currently recommended -> needs outpatient Vascular has been consulted    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%  Appreciate vascular recommendations -> follow outpatient    Hypokalemia -Replete  Diabetes mellitus type 2 -Continue sliding scale insulin with NovoLog -at home on trulicity, jardiance, metformin    Recent history of GI bleed -trend Hb/Hct   Hypertension -BP goal 130-160 before basilar artery revascularization -Continue nebivolol, -lisinopril 10 mg was started 10/2 -adjust meds as needed for goal  -Continue as needed IV hydralazine   Anxiety -limit benzo to 5 more doses, once Zaidan Keeble day prn   Diarrhea -She has history of chronic diarrhea -now being treated for c diff as above -diarrhea improved    DVT prophylaxis: lovenox Code Status: full Family Communication: husband at bedside Disposition:   Status is: Inpatient Remains inpatient appropriate because: continued need for inpatient care   Consultants:  Neurology Neuro IR Vascular Surgery GI   Procedures:  Echo IMPRESSIONS     1. Left ventricular ejection fraction, by estimation, is 60 to 65%. The  left ventricle has normal function. The left ventricle has no regional  wall motion abnormalities. There is mild  left ventricular hypertrophy.  Left ventricular diastolic parameters  are consistent with Grade I diastolic dysfunction (impaired relaxation).   2. Right ventricular  systolic function is normal. The right ventricular  size is normal. Tricuspid regurgitation signal is inadequate for assessing  PA pressure.   3. Resolution is challenging for bubble study; possible small PFO with Josceline Chenard  few bubble within 3-5 cardiac cycles.   4. The mitral valve is normal in structure. No evidence of mitral valve  regurgitation.   5. The aortic valve is normal in structure. Aortic valve regurgitation is  not visualized.   6. The inferior vena cava is normal in size with greater than 50%  respiratory variability, suggesting right atrial pressure of 3 mmHg.   Conclusion(s)/Recommendation(s): Possible small PFO; considering his age  and comorbidites stroke etiology is multifactorial. Would not w/u for PFO  closure.   Antimicrobials:  Anti-infectives (From admission, onward)    Start     Dose/Rate Route Frequency Ordered Stop   09/15/23 1400  vancomycin (VANCOCIN) capsule 125 mg  Status:  Discontinued        125 mg Oral 4 times daily 09/15/23 1218 09/15/23 1219   09/15/23 1400  vancomycin (VANCOCIN) capsule 125 mg        125 mg Oral 4 times daily 09/15/23 1219 10/05/23 1359   09/14/23 1445  amoxicillin-clavulanate (AUGMENTIN) 875-125 MG per tablet 1 tablet        1 tablet Oral Every 12 hours 09/14/23 1354 09/24/23 0959   09/10/23 1030  piperacillin-tazobactam (ZOSYN) IVPB 3.375 g  Status:  Discontinued        3.375 g 12.5 mL/hr over 240 Minutes Intravenous Every 8 hours 09/10/23 0943 09/14/23 1354       Subjective: Diarrhea improving  Objective: Vitals:   09/18/23 0339 09/18/23 0720 09/18/23 0755 09/18/23 1128  BP: (!) 162/73 (!) 168/60 (!) 156/67 (!) 132/53  Pulse: 60 67  87  Resp: 18 16  18   Temp: 98.2 F (36.8 C) 98.4 F (36.9 C)  98.8 F (37.1 C)  TempSrc:  Oral  Oral  SpO2: 100% 100%  94%  Weight:      Height:        Intake/Output Summary (Last 24 hours) at 09/18/2023 1405 Last data filed at 09/18/2023 0721 Gross per 24 hour  Intake 3417.45 ml   Output 2200 ml  Net 1217.45 ml   Filed Weights   09/09/23 0410  Weight: 65.8 kg    Examination:  General: No acute distress. Cardiovascular: RRR Lungs: unlabored Abdomen: Soft, nontender, nondistended Neurological: Alert and oriented 3. Moves all extremities 4. Cranial nerves II through XII grossly intact. Extremities: No clubbing or cyanosis. No edema.   Data Reviewed: I have personally reviewed following labs and imaging studies  CBC: Recent Labs  Lab 09/12/23 0746 09/13/23 0623 09/14/23 1456 09/15/23 0745 09/16/23 0703  WBC 5.0 4.3 4.8 4.0 4.2  NEUTROABS  --   --  2.6 2.2 2.0  HGB 10.2* 9.6* 10.3* 9.8* 9.9*  HCT 32.2* 31.2* 33.3* 31.4* 32.2*  MCV 86.1 88.1 90.2 90.0 89.2  PLT 309 295 304 254 251    Basic Metabolic Panel: Recent Labs  Lab 09/12/23 0746 09/13/23 0623 09/14/23 1456 09/15/23 0745 09/16/23 0703  NA 139 138 136 135 137  K 3.4* 4.5 3.8 3.1* 4.3  CL 101 107 105 103 108  CO2 21* 25 24 23  21*  GLUCOSE 131* 105* 102* 113* 115*  BUN 8 6* 6* 5* 5*  CREATININE 0.81 0.86 0.77 0.83 0.58  CALCIUM 9.0 8.9 8.6* 8.3* 8.4*  MG  --   --   --  1.4* 2.1  PHOS  --   --   --  2.9 2.9    GFR: Estimated Creatinine Clearance: 54.1 mL/min (by C-G formula based on SCr of 0.58 mg/dL).  Liver Function Tests: Recent Labs  Lab 09/12/23 0746 09/14/23 1456 09/15/23 0745 09/16/23 0703  AST 17 33 26 29  ALT 18 26 26 26   ALKPHOS 71 68 61 62  BILITOT 0.8 0.6 0.3 0.6  PROT 5.9* 5.9* 5.3* 5.4*  ALBUMIN 2.8* 3.0* 2.6* 2.8*    CBG: Recent Labs  Lab 09/17/23 1216 09/17/23 1620 09/17/23 2103 09/18/23 0639 09/18/23 1249  GLUCAP 149* 91 119* 111* 215*     Recent Results (from the past 240 hour(s))  Urine Culture     Status: Abnormal   Collection Time: 09/09/23  6:30 PM   Specimen: Urine, Clean Catch  Result Value Ref Range Status   Specimen Description URINE, CLEAN CATCH  Final   Special Requests   Final    NONE Reflexed from [id number] Performed at  [facility], [address], [place], [place] [address]    Culture >=100,000 COLONIES/mL KLEBSIELLA OXYTOCA (Macai Sisneros)  Final   Report Status 09/11/2023 FINAL  Final   Organism ID, Bacteria KLEBSIELLA OXYTOCA (Ezinne Yogi)  Final      Susceptibility   Klebsiella oxytoca - MIC*    AMPICILLIN >=32 RESISTANT Resistant     CEFEPIME <=0.12 SENSITIVE Sensitive     CEFTRIAXONE <=0.25 SENSITIVE Sensitive     CIPROFLOXACIN <=0.25 SENSITIVE Sensitive     GENTAMICIN <=1 SENSITIVE Sensitive     IMIPENEM <=0.25 SENSITIVE Sensitive     NITROFURANTOIN 32 SENSITIVE Sensitive     TRIMETH/SULFA <=20 SENSITIVE Sensitive     AMPICILLIN/SULBACTAM 8 SENSITIVE Sensitive     PIP/TAZO <=4 SENSITIVE Sensitive     * >=100,000 COLONIES/mL KLEBSIELLA OXYTOCA  MRSA Next Gen by PCR, Nasal     Status: Abnormal   Collection Time: 09/09/23  8:05 PM   Specimen: Nasal Mucosa; Nasal Swab  Result Value Ref Range Status   MRSA by PCR Next Gen DETECTED (Anzlee Hinesley) NOT DETECTED Final    Comment: RESULT CALLED TO, READ BACK BY AND VERIFIED WITH: F OKASOR,RN@[id number]  09/10/23 MK (NOTE) The GeneXpert MRSA Assay (FDA approved for NASAL specimens only), is one component of Nicle Connole comprehensive MRSA colonization surveillance program. It is not intended to diagnose MRSA infection nor to guide or monitor treatment for MRSA infections. Test performance is not FDA approved in patients less than 60 years old. Performed at [facility], [address] [place], [place] [address]   Gastrointestinal Panel by PCR , Stool     Status: None   Collection Time: 09/13/23  9:39 PM   Specimen: STOOL  Result Value Ref Range Status   Campylobacter species NOT DETECTED NOT DETECTED Final   Plesimonas shigelloides NOT DETECTED NOT DETECTED Final   Salmonella species NOT DETECTED NOT DETECTED Final   Yersinia enterocolitica NOT DETECTED NOT DETECTED Final   Vibrio species NOT DETECTED NOT DETECTED Final   Vibrio cholerae NOT DETECTED NOT DETECTED Final    Enteroaggregative E coli (EAEC) NOT DETECTED NOT DETECTED Final   Enteropathogenic E coli (EPEC) NOT DETECTED NOT DETECTED Final   Enterotoxigenic E coli (ETEC) NOT DETECTED NOT DETECTED Final   Shiga like toxin producing E coli (STEC) NOT DETECTED NOT DETECTED  Final   Shigella/Enteroinvasive E coli (EIEC) NOT DETECTED NOT DETECTED Final   Cryptosporidium NOT DETECTED NOT DETECTED Final   Cyclospora cayetanensis NOT DETECTED NOT DETECTED Final   Entamoeba histolytica NOT DETECTED NOT DETECTED Final   Giardia lamblia NOT DETECTED NOT DETECTED Final   Adenovirus F40/41 NOT DETECTED NOT DETECTED Final   Astrovirus NOT DETECTED NOT DETECTED Final   Norovirus GI/GII NOT DETECTED NOT DETECTED Final   Rotavirus Kayline Sheer NOT DETECTED NOT DETECTED Final   Sapovirus (I, II, IV, and V) NOT DETECTED NOT DETECTED Final    Comment: Performed at [facility], [address] [place], [place] [address]  C Difficile Quick Screen (NO PCR Reflex)     Status: Abnormal   Collection Time: 09/13/23  9:40 PM   Specimen: STOOL  Result Value Ref Range Status   C Diff antigen POSITIVE (Spurgeon Gancarz) NEGATIVE Final   C Diff toxin NEGATIVE NEGATIVE Final   C Diff interpretation   Final    Results are indeterminate. Please contact the provider listed for your campus for C diff questions in AMION.    Comment: Performed at [facility], [address], [place], [place] [address]  Culture, blood (Routine X 2) w Reflex to ID Panel     Status: None   Collection Time: 09/13/23  9:56 PM   Specimen: BLOOD  Result Value Ref Range Status   Specimen Description BLOOD BLOOD LEFT ARM  Final   Special Requests   Final    BOTTLES DRAWN AEROBIC AND ANAEROBIC Blood Culture adequate volume   Culture   Final    NO GROWTH 5 DAYS Performed at [facility], [address], [place], [place] [address]    Report Status 09/18/2023 FINAL  Final  Culture, blood (Routine X 2) w Reflex to ID Panel     Status: None   Collection Time:  09/13/23  9:57 PM   Specimen: BLOOD LEFT HAND  Result Value Ref Range Status   Specimen Description BLOOD LEFT HAND  Final   Special Requests   Final    BOTTLES DRAWN AEROBIC AND ANAEROBIC Blood Culture adequate volume   Culture   Final    NO GROWTH 5 DAYS Performed at [facility], [address] [place], [place] [address]    Report Status 09/18/2023 FINAL  Final  C. Diff by PCR     Status: Abnormal   Collection Time: 09/14/23  2:40 PM   Specimen: STOOL  Result Value Ref Range Status   Toxigenic C. Difficile by PCR POSITIVE (Earon Rivest) NEGATIVE Final    Comment: Positive for toxigenic C. difficile with little to no toxin production. Only treat if clinical presentation suggests symptomatic illness. Performed at [facility], [address] [place], [place] [address]   Urine Culture (for pregnant, neutropenic or urologic patients or patients with an indwelling urinary catheter)     Status: None   Collection Time: 09/16/23 11:00 AM   Specimen: Urine, Clean Catch  Result Value Ref Range Status   Specimen Description URINE, CLEAN CATCH  Final   Special Requests NONE  Final   Culture   Final    NO GROWTH Performed at [facility], [address], [place], [place] [address]    Report Status 09/17/2023 FINAL  Final         Radiology Studies: No results found.      Scheduled Meds:  amoxicillin-clavulanate  1 tablet Oral Q12H  aspirin  81 mg Oral Daily   enoxaparin (LOVENOX) injection  40 mg Subcutaneous Q24H   insulin aspart  0-9 Units Subcutaneous TID WC   lisinopril  10 mg Oral Daily   nebivolol  5 mg Oral Daily   rosuvastatin  20 mg Oral Daily   ticagrelor  90 mg Oral BID   traZODone  50 mg Oral QHS   vancomycin  125 mg Oral QID   Continuous Infusions:     LOS: 9 days    Time spent: over 30 min    Lacretia Nicks, MD [facility]   To contact the attending provider between 7A-7P or the covering provider during after hours 7P-7A,  please log into the web site [email or web address] and access using universal [place] password for that web site. If you do not have the password, please call the hospital operator.  09/18/2023, 2:05 PM

## 2023-09-18 NOTE — Plan of Care (Signed)

## 2023-09-19 ENCOUNTER — Encounter (HOSPITAL_COMMUNITY)
Admission: EM | Disposition: A | Discharge status: Home / Self Care | Payer: Self-pay | Source: Home / Self Care | Attending: Family Medicine

## 2023-09-19 ENCOUNTER — Inpatient Hospital Stay (HOSPITAL_COMMUNITY): Payer: Self-pay | Admitting: Anesthesiology

## 2023-09-19 ENCOUNTER — Inpatient Hospital Stay (HOSPITAL_COMMUNITY): Payer: Medicare HMO | Admitting: Anesthesiology

## 2023-09-19 ENCOUNTER — Encounter (HOSPITAL_COMMUNITY): Payer: Self-pay | Admitting: Internal Medicine

## 2023-09-19 ENCOUNTER — Other Ambulatory Visit (HOSPITAL_COMMUNITY): Payer: Self-pay

## 2023-09-19 ENCOUNTER — Inpatient Hospital Stay (HOSPITAL_COMMUNITY): Payer: Medicare HMO

## 2023-09-19 DIAGNOSIS — E119 Type 2 diabetes mellitus without complications: Secondary | ICD-10-CM

## 2023-09-19 DIAGNOSIS — I651 Occlusion and stenosis of basilar artery: Secondary | ICD-10-CM

## 2023-09-19 DIAGNOSIS — Z7985 Long-term (current) use of injectable non-insulin antidiabetic drugs: Secondary | ICD-10-CM

## 2023-09-19 DIAGNOSIS — G459 Transient cerebral ischemic attack, unspecified: Secondary | ICD-10-CM | POA: Diagnosis not present

## 2023-09-19 HISTORY — PX: RADIOLOGY WITH ANESTHESIA: SHX6223

## 2023-09-19 HISTORY — PX: IR ANGIO VERTEBRAL SEL VERTEBRAL UNI R MOD SED: IMG5368

## 2023-09-19 LAB — GLUCOSE, CAPILLARY
Glucose-Capillary: 125 mg/dL — ABNORMAL HIGH (ref 70–99)
Glucose-Capillary: 126 mg/dL — ABNORMAL HIGH (ref 70–99)
Glucose-Capillary: 231 mg/dL — ABNORMAL HIGH (ref 70–99)
Glucose-Capillary: 206 mg/dL — ABNORMAL HIGH (ref 70–99)

## 2023-09-19 LAB — POCT ACTIVATED CLOTTING TIME: Activated Clotting Time: 189 s

## 2023-09-19 SURGERY — IR WITH ANESTHESIA
Anesthesia: General

## 2023-09-19 MED ORDER — VERAPAMIL HCL 2.5 MG/ML IV SOLN
INTRAVENOUS | Status: AC
  Filled 2023-09-19: qty 2

## 2023-09-19 MED ORDER — SODIUM CHLORIDE 0.9 % IV SOLN: INTRAVENOUS | Status: DC

## 2023-09-19 MED ORDER — FENTANYL CITRATE (PF) 100 MCG/2ML IJ SOLN
INTRAMUSCULAR | Status: AC
  Filled 2023-09-19: qty 2

## 2023-09-19 MED ORDER — ACETAMINOPHEN 160 MG/5ML PO SOLN: 650.0000 mg | ORAL | Status: DC | PRN

## 2023-09-19 MED ORDER — TICAGRELOR 90 MG PO TABS: 90.0000 mg | ORAL_TABLET | Freq: Two times a day (BID) | ORAL | Status: DC

## 2023-09-19 MED ORDER — CLEVIDIPINE BUTYRATE 0.5 MG/ML IV EMUL
0.0000 mg/h | Freq: Once | INTRAVENOUS | Status: AC
  Administered 2023-09-19: 2 mg/h via INTRAVENOUS
  Filled 2023-09-19: qty 50

## 2023-09-19 MED ORDER — LACTATED RINGERS IV SOLN: INTRAVENOUS | Status: DC

## 2023-09-19 MED ORDER — HYDRALAZINE HCL 20 MG/ML IJ SOLN
10.0000 mg | Freq: Once | INTRAMUSCULAR | Status: AC
  Administered 2023-09-19: 10 mg via INTRAVENOUS

## 2023-09-19 MED ORDER — OXYCODONE HCL 5 MG/5ML PO SOLN: 5.0000 mg | Freq: Once | ORAL | Status: DC | PRN

## 2023-09-19 MED ORDER — LIDOCAINE 2% (20 MG/ML) 5 ML SYRINGE
INTRAMUSCULAR | Status: DC | PRN
  Administered 2023-09-19: 40 mg via INTRAVENOUS

## 2023-09-19 MED ORDER — ACETAMINOPHEN 325 MG PO TABS
650.0000 mg | ORAL_TABLET | ORAL | Status: DC | PRN
  Administered 2023-09-19 (×2): 650 mg via ORAL
  Filled 2023-09-19: qty 2

## 2023-09-19 MED ORDER — ACETAMINOPHEN 325 MG PO TABS
ORAL_TABLET | ORAL | Status: AC
  Filled 2023-09-19: qty 2

## 2023-09-19 MED ORDER — ONDANSETRON HCL 4 MG/2ML IJ SOLN: 4.0000 mg | Freq: Once | INTRAMUSCULAR | Status: DC | PRN

## 2023-09-19 MED ORDER — ROCURONIUM BROMIDE 10 MG/ML (PF) SYRINGE
PREFILLED_SYRINGE | INTRAVENOUS | Status: DC | PRN
  Administered 2023-09-19: 50 mg via INTRAVENOUS

## 2023-09-19 MED ORDER — CHLORHEXIDINE GLUCONATE 0.12 % MT SOLN
OROMUCOSAL | Status: AC
  Administered 2023-09-19: 15 mL via OROMUCOSAL
  Filled 2023-09-19: qty 15

## 2023-09-19 MED ORDER — HEPARIN SODIUM (PORCINE) 1000 UNIT/ML IJ SOLN
INTRAMUSCULAR | Status: AC
  Filled 2023-09-19: qty 10

## 2023-09-19 MED ORDER — DEXAMETHASONE SODIUM PHOSPHATE 10 MG/ML IJ SOLN
INTRAMUSCULAR | Status: DC | PRN
  Administered 2023-09-19: 10 mg via INTRAVENOUS

## 2023-09-19 MED ORDER — ASPIRIN 81 MG PO CHEW
81.0000 mg | CHEWABLE_TABLET | Freq: Every day | ORAL | Status: DC
  Administered 2023-09-20: 81 mg via ORAL
  Filled 2023-09-19: qty 1

## 2023-09-19 MED ORDER — PROTAMINE SULFATE 10 MG/ML IV SOLN
INTRAVENOUS | Status: DC | PRN
Start: 2023-09-19 — End: 2023-09-19
  Administered 2023-09-19: 5 mg via INTRAVENOUS

## 2023-09-19 MED ORDER — ONDANSETRON HCL 4 MG/2ML IJ SOLN
INTRAMUSCULAR | Status: DC | PRN
  Administered 2023-09-19: 4 mg via INTRAVENOUS

## 2023-09-19 MED ORDER — CLEVIDIPINE BUTYRATE 0.5 MG/ML IV EMUL
INTRAVENOUS | Status: DC | PRN
Start: 2023-09-19 — End: 2023-09-19
  Administered 2023-09-19: 2 mg/h via INTRAVENOUS

## 2023-09-19 MED ORDER — ASPIRIN 81 MG PO CHEW: 81.0000 mg | CHEWABLE_TABLET | Freq: Every day | ORAL | Status: DC

## 2023-09-19 MED ORDER — TICAGRELOR 90 MG PO TABS
90.0000 mg | ORAL_TABLET | Freq: Two times a day (BID) | ORAL | Status: DC
  Administered 2023-09-19 – 2023-09-20 (×2): 90 mg via ORAL
  Filled 2023-09-19 (×2): qty 1

## 2023-09-19 MED ORDER — CHLORHEXIDINE GLUCONATE CLOTH 2 % EX PADS: 6.0000 | MEDICATED_PAD | Freq: Every day | CUTANEOUS | Status: DC

## 2023-09-19 MED ORDER — ORAL CARE MOUTH RINSE: 15.0000 mL | OROMUCOSAL | Status: DC | PRN

## 2023-09-19 MED ORDER — HEPARIN SODIUM (PORCINE) 1000 UNIT/ML IJ SOLN
INTRAMUSCULAR | Status: DC | PRN
Start: 2023-09-19 — End: 2023-09-19
  Administered 2023-09-19: 3000 [IU] via INTRAVENOUS

## 2023-09-19 MED ORDER — ACETAMINOPHEN 650 MG RE SUPP: 650.0000 mg | RECTAL | Status: DC | PRN

## 2023-09-19 MED ORDER — LABETALOL HCL 5 MG/ML IV SOLN
INTRAVENOUS | Status: DC | PRN
Start: 2023-09-19 — End: 2023-09-19
  Administered 2023-09-19: 5 mg via INTRAVENOUS
  Administered 2023-09-19: 10 mg via INTRAVENOUS

## 2023-09-19 MED ORDER — PROPOFOL 10 MG/ML IV BOLUS
INTRAVENOUS | Status: DC | PRN
  Administered 2023-09-19: 50 mg via INTRAVENOUS
  Administered 2023-09-19: 120 mg via INTRAVENOUS

## 2023-09-19 MED ORDER — CLEVIDIPINE BUTYRATE 0.5 MG/ML IV EMUL
INTRAVENOUS | Status: AC
  Filled 2023-09-19: qty 50

## 2023-09-19 MED ORDER — FENTANYL CITRATE (PF) 250 MCG/5ML IJ SOLN
INTRAMUSCULAR | Status: DC | PRN
  Administered 2023-09-19: 25 ug via INTRAVENOUS
  Administered 2023-09-19: 50 ug via INTRAVENOUS
  Administered 2023-09-19: 25 ug via INTRAVENOUS

## 2023-09-19 MED ORDER — OXYCODONE HCL 5 MG PO TABS
ORAL_TABLET | ORAL | Status: AC
  Filled 2023-09-19: qty 1

## 2023-09-19 MED ORDER — HYDRALAZINE HCL 20 MG/ML IJ SOLN
INTRAMUSCULAR | Status: AC
  Filled 2023-09-19: qty 1

## 2023-09-19 MED ORDER — PHENYLEPHRINE HCL-NACL 20-0.9 MG/250ML-% IV SOLN
INTRAVENOUS | Status: DC | PRN
  Administered 2023-09-19: 25 ug/min via INTRAVENOUS

## 2023-09-19 MED ORDER — NITROGLYCERIN 1 MG/10 ML FOR IR/CATH LAB
INTRA_ARTERIAL | Status: AC
  Filled 2023-09-19: qty 10

## 2023-09-19 MED ORDER — SUGAMMADEX SODIUM 200 MG/2ML IV SOLN
INTRAVENOUS | Status: DC | PRN
  Administered 2023-09-19: 131.6 mg via INTRAVENOUS

## 2023-09-19 MED ORDER — PHENYLEPHRINE 80 MCG/ML (10ML) SYRINGE FOR IV PUSH (FOR BLOOD PRESSURE SUPPORT)
PREFILLED_SYRINGE | INTRAVENOUS | Status: DC | PRN
  Administered 2023-09-19 (×5): 160 ug via INTRAVENOUS

## 2023-09-19 MED ORDER — NITROGLYCERIN 1 MG/10 ML FOR IR/CATH LAB
INTRA_ARTERIAL | Status: AC | PRN
  Administered 2023-09-19: 25 ug via INTRA_ARTERIAL

## 2023-09-19 MED ORDER — EPHEDRINE SULFATE-NACL 50-0.9 MG/10ML-% IV SOSY
PREFILLED_SYRINGE | INTRAVENOUS | Status: DC | PRN
  Administered 2023-09-19 (×2): 5 mg via INTRAVENOUS

## 2023-09-19 MED ORDER — CHLORHEXIDINE GLUCONATE 0.12 % MT SOLN: 15.0000 mL | Freq: Once | OROMUCOSAL | Status: AC

## 2023-09-19 MED ORDER — ORAL CARE MOUTH RINSE: 15.0000 mL | Freq: Once | OROMUCOSAL | Status: AC

## 2023-09-19 MED ORDER — OXYCODONE HCL 5 MG PO TABS: 5.0000 mg | ORAL_TABLET | Freq: Once | ORAL | Status: DC | PRN

## 2023-09-19 MED ORDER — FENTANYL CITRATE (PF) 100 MCG/2ML IJ SOLN: 25.0000 ug | INTRAMUSCULAR | Status: DC | PRN

## 2023-09-19 NOTE — Progress Notes (Signed)
eLink Physician-Brief Progress Note Patient Name: [name] DOB: 10/20/1946 MRN: [id number]   Date of Service  09/19/2023  HPI/Events of Note  77 y.o. female with medical history significant of HTN, HLD, diverticulosis, DM2, ETOH abuse in remission, right ICA stenosis status post stenting, interim history of admission 8/25/-8/29 for lower gi bleed presumed diverticular with associated hemorrhagic shock which required icu admission while on DAPT.  Admitted to the ICU with likely transient ischemic attack status post vertebral artery angiogram with incomplete revascularization of the previously occluded distal basilar artery and PCA and superior cerebellar arteries.  In the ICU, blood pressures are well-controlled in the 120s-140s.  Saturating 97% on room air with otherwise normal vitals.  Last set of labs available on 10/4-metabolic panel fairly unremarkable, CBC with hemoglobin 9.9.  Mild hyperglycemia.  C. difficile positive 10/2.   eICU Interventions  Maintain dual antiplatelet therapy with aspirin and ticagrelor.  Maintain high-dose statin.  Oral vancomycin to extend 10 days after concurrent Augmentin.  DVT prophylaxis with enoxaparin GI prophylaxis not indicated     Intervention Category Evaluation Type: New Patient Evaluation  [name] 09/19/2023, 9:57 PM

## 2023-09-19 NOTE — Sedation Documentation (Signed)
ACT 189.  MD acknowledged.  Anesthesia to give 5mg  IV protamine.

## 2023-09-19 NOTE — Progress Notes (Addendum)
PROGRESS NOTE    [name]  ZOX:[id number] DOB: 1946-03-14 DOA: 09/09/2023 PCP: Marguarite Arbour, MD  Chief Complaint  Patient presents with   Emesis    Brief Narrative:   77 y.o. female with medical history significant of HTN, HLD, diverticulosis, DM2, ETOH abuse in remission, right ICA stenosis status post stenting, interim history of admission 8/25/-8/29 for lower gi bleed presumed diverticular with associated hemorrhagic shock which required icu admission while on DAPT.   Patient course further complicated by appendectomy 2 weeks ago. Patient presents  who further interim history of feeling poorly over the last few days with n/v/d and abdominal  pain. However on waking this am around 2 am with  Cloyde Oregel bout of n/v she noted weakness/numbness in right leg. Due to this EMS was called.   Assessment & Plan:   Principal Problem:   TIA (transient ischemic attack) Active Problems:   Chronic diarrhea   Abnormal CT scan, colon   Occlusion and stenosis of basilar artery  TIA - suspected due to basilar artery stenosis in setting of dehydration with vomiting/diarrhea - CTA head/neck with distal basilar artery occlusion or near occlusion, nonvisualization of distal 3-4 mm of basilar (new since 2012), involvement of R PCA origin, but right PCA is reconstituted.  SCA's not identified. Improved cervical L ICA following stenting.  High grade contralateral proximal R ICA stenosis. - MRI brain without acute ischemia - MRA with poor flow/occlusion of basilar artery tip and R PCA origin  - was on aspirin/plavix prior to admission -> ASA/brilinta in preparation of stenting - will repeat urine culture 10/4 - no growth - crestor  - A1c 5.7, LDL 117 - now is s/p R vertebral artery angiogram, with interval complete revascularization of previously occluded distal basilar artery, the posterior cerebral artery and the superior cerebellar arteries, 10/7.  to neuro ICU post op per neuro interventional.  On  cleviprex post procedure per neuro interventional radiology.     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 -blood cultures NGTDx2  -transition to augmentin, 10 days augmentin   Mild thickening and inflammation of ileocecal junction and proximal ascending colon Distal ileitis/colitis (infectious vs inflammatory vs ischemic?) C diff antigen positive, negative toxin -> PCR positive, treating with vancomycin -> continue 7 days post augmentin Negative GI path panel  Appreciate surgery assistance with recent appendectomy 2 weeks ago -> recommending vascular surgery c/s with regards to question of stenoses of mesenteric vessels potentially contributing to presentation  Appreciate GI assistance -> r/o c diff, no endoscopy/colonoscopy currently recommended -> needs outpatient Vascular has been consulted (as below)  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%  Appreciate vascular recommendations -> follow outpatient    Hypokalemia -Replete  Diabetes mellitus type 2 -Continue sliding scale insulin with NovoLog -at home on trulicity, jardiance, metformin    Recent history of GI bleed -trend Hb/Hct   Hypertension -BP goal 130-160 before basilar artery revascularization -Continue nebivolol, -lisinopril 10 mg was started 10/2 -cleviprex per neuro IR  -Continue as needed IV hydralazine   Anxiety -limit benzo to 5 more doses, once Vondra Aldredge day prn   Diarrhea -She has history of chronic diarrhea -now being treated for c diff as above -diarrhea improved    DVT prophylaxis: lovenox Code Status: full Family Communication: husband at bedside Disposition:   Status is: Inpatient Remains inpatient appropriate because: continued need for inpatient care   Consultants:  Neurology Neuro IR Vascular Surgery GI   Procedures:  Echo IMPRESSIONS  1. Left ventricular ejection fraction, by estimation, is 60 to 65%. The  left ventricle has normal function. The left  ventricle has no regional  wall motion abnormalities. There is mild left ventricular hypertrophy.  Left ventricular diastolic parameters  are consistent with Grade I diastolic dysfunction (impaired relaxation).   2. Right ventricular systolic function is normal. The right ventricular  size is normal. Tricuspid regurgitation signal is inadequate for assessing  PA pressure.   3. Resolution is challenging for bubble study; possible small PFO with Jusitn Salsgiver  few bubble within 3-5 cardiac cycles.   4. The mitral valve is normal in structure. No evidence of mitral valve  regurgitation.   5. The aortic valve is normal in structure. Aortic valve regurgitation is  not visualized.   6. The inferior vena cava is normal in size with greater than 50%  respiratory variability, suggesting right atrial pressure of 3 mmHg.   Conclusion(s)/Recommendation(s): Possible small PFO; considering his age  and comorbidites stroke etiology is multifactorial. Would not w/u for PFO  closure.   Antimicrobials:  Anti-infectives (From admission, onward)    Start     Dose/Rate Route Frequency Ordered Stop   09/15/23 1400  vancomycin (VANCOCIN) capsule 125 mg  Status:  Discontinued        125 mg Oral 4 times daily 09/15/23 1218 09/15/23 1219   09/15/23 1400  [MAR Hold]  vancomycin (VANCOCIN) capsule 125 mg        (MAR Hold since Mon 09/19/2023 at 0646.Hold Reason: Transfer to [facility] Procedural area)   125 mg Oral 4 times daily 09/15/23 1219 10/05/23 1359   09/14/23 1445  [MAR Hold]  amoxicillin-clavulanate (AUGMENTIN) 875-125 MG per tablet 1 tablet        (MAR Hold since Mon 09/19/2023 at 0646.Hold Reason: Transfer to [facility] Procedural area)   1 tablet Oral Every 12 hours 09/14/23 1354 09/24/23 0959   09/10/23 1030  piperacillin-tazobactam (ZOSYN) IVPB 3.375 g  Status:  Discontinued        3.375 g 12.5 mL/hr over 240 Minutes Intravenous Every 8 hours 09/10/23 0943 09/14/23 1354       Subjective: Notes some mild abdominal    Objective: Vitals:   09/19/23 1315 09/19/23 1330 09/19/23 1345 09/19/23 1400  BP: (!) 122/47 (!) 135/49 (!) 133/52 (!) 119/48  Pulse: 89 96 99 98  Resp: 16 (!) 30 (!) 23 12  Temp:      TempSrc:      SpO2: 95% 95% 93% 97%  Weight:      Height:        Intake/Output Summary (Last 24 hours) at 09/19/2023 1416 Last data filed at 09/19/2023 1000 Gross per 24 hour  Intake 600 ml  Output 800 ml  Net -200 ml   Filed Weights   09/09/23 0410  Weight: 65.8 kg    Examination:  General: No acute distress.  Seen in PACU Cardiovascular: RRR Lungs: unlabored Abdomen: mild abdominal discomfort R groin dressing  Neurological: Alert and oriented 3. Moves all extremities 4. Cranial nerves II through XII grossly intact. Extremities: No clubbing or cyanosis. No edema.   Data Reviewed: I have personally reviewed following labs and imaging studies  CBC: Recent Labs  Lab 09/13/23 0623 09/14/23 1456 09/15/23 0745 09/16/23 0703  WBC 4.3 4.8 4.0 4.2  NEUTROABS  --  2.6 2.2 2.0  HGB 9.6* 10.3* 9.8* 9.9*  HCT 31.2* 33.3* 31.4* 32.2*  MCV 88.1 90.2 90.0 89.2  PLT 295 304 254 251  Basic Metabolic Panel: Recent Labs  Lab 09/13/23 0623 09/14/23 1456 09/15/23 0745 09/16/23 0703  NA 138 136 135 137  K 4.5 3.8 3.1* 4.3  CL 107 105 103 108  CO2 25 24 23  21*  GLUCOSE 105* 102* 113* 115*  BUN 6* 6* 5* 5*  CREATININE 0.86 0.77 0.83 0.58  CALCIUM 8.9 8.6* 8.3* 8.4*  MG  --   --  1.4* 2.1  PHOS  --   --  2.9 2.9    GFR: Estimated Creatinine Clearance: 54.1 mL/min (by C-G formula based on SCr of 0.58 mg/dL).  Liver Function Tests: Recent Labs  Lab 09/14/23 1456 09/15/23 0745 09/16/23 0703  AST 33 26 29  ALT 26 26 26   ALKPHOS 68 61 62  BILITOT 0.6 0.3 0.6  PROT 5.9* 5.3* 5.4*  ALBUMIN 3.0* 2.6* 2.8*    CBG: Recent Labs  Lab 09/18/23 1249 09/18/23 1713 09/18/23 2248 09/19/23 0623 09/19/23 0952  GLUCAP 215* 149* 126* 125* 126*     Recent Results (from the  past 240 hour(s))  Urine Culture     Status: Abnormal   Collection Time: 09/09/23  6:30 PM   Specimen: Urine, Clean Catch  Result Value Ref Range Status   Specimen Description URINE, CLEAN CATCH  Final   Special Requests   Final    NONE Reflexed from [id number] Performed at [facility], [address] [place], [place] [address]    Culture >=100,000 COLONIES/mL KLEBSIELLA OXYTOCA (Danaija Eskridge)  Final   Report Status 09/11/2023 FINAL  Final   Organism ID, Bacteria KLEBSIELLA OXYTOCA (Siddhant Hashemi)  Final      Susceptibility   Klebsiella oxytoca - MIC*    AMPICILLIN >=32 RESISTANT Resistant     CEFEPIME <=0.12 SENSITIVE Sensitive     CEFTRIAXONE <=0.25 SENSITIVE Sensitive     CIPROFLOXACIN <=0.25 SENSITIVE Sensitive     GENTAMICIN <=1 SENSITIVE Sensitive     IMIPENEM <=0.25 SENSITIVE Sensitive     NITROFURANTOIN 32 SENSITIVE Sensitive     TRIMETH/SULFA <=20 SENSITIVE Sensitive     AMPICILLIN/SULBACTAM 8 SENSITIVE Sensitive     PIP/TAZO <=4 SENSITIVE Sensitive     * >=100,000 COLONIES/mL KLEBSIELLA OXYTOCA  MRSA Next Gen by PCR, Nasal     Status: Abnormal   Collection Time: 09/09/23  8:05 PM   Specimen: Nasal Mucosa; Nasal Swab  Result Value Ref Range Status   MRSA by PCR Next Gen DETECTED (Neziah Braley) NOT DETECTED Final    Comment: RESULT CALLED TO, READ BACK BY AND VERIFIED WITH: F OKASOR,RN@[id number]  09/10/23 MK (NOTE) The GeneXpert MRSA Assay (FDA approved for NASAL specimens only), is one component of Edwen Mclester comprehensive MRSA colonization surveillance program. It is not intended to diagnose MRSA infection nor to guide or monitor treatment for MRSA infections. Test performance is not FDA approved in patients less than 10 years old. Performed at [facility], [address], [place], [place] [address]   Gastrointestinal Panel by PCR , Stool     Status: None   Collection Time: 09/13/23  9:39 PM   Specimen: STOOL  Result Value Ref Range Status   Campylobacter species NOT DETECTED NOT DETECTED Final    Plesimonas shigelloides NOT DETECTED NOT DETECTED Final   Salmonella species NOT DETECTED NOT DETECTED Final   Yersinia enterocolitica NOT DETECTED NOT DETECTED Final   Vibrio species NOT DETECTED NOT DETECTED Final   Vibrio cholerae NOT DETECTED NOT DETECTED Final   Enteroaggregative E coli (EAEC) NOT DETECTED NOT DETECTED  Final   Enteropathogenic E coli (EPEC) NOT DETECTED NOT DETECTED Final   Enterotoxigenic E coli (ETEC) NOT DETECTED NOT DETECTED Final   Shiga like toxin producing E coli (STEC) NOT DETECTED NOT DETECTED Final   Shigella/Enteroinvasive E coli (EIEC) NOT DETECTED NOT DETECTED Final   Cryptosporidium NOT DETECTED NOT DETECTED Final   Cyclospora cayetanensis NOT DETECTED NOT DETECTED Final   Entamoeba histolytica NOT DETECTED NOT DETECTED Final   Giardia lamblia NOT DETECTED NOT DETECTED Final   Adenovirus F40/41 NOT DETECTED NOT DETECTED Final   Astrovirus NOT DETECTED NOT DETECTED Final   Norovirus GI/GII NOT DETECTED NOT DETECTED Final   Rotavirus [place] NOT DETECTED NOT DETECTED Final   Sapovirus (I, II, IV, and V) NOT DETECTED NOT DETECTED Final    Comment: Performed at [facility], [address] [place], [place] [address]  C Difficile Quick Screen (NO PCR Reflex)     Status: Abnormal   Collection Time: 09/13/23  9:40 PM   Specimen: STOOL  Result Value Ref Range Status   C Diff antigen POSITIVE (Wilena Tyndall) NEGATIVE Final   C Diff toxin NEGATIVE NEGATIVE Final   C Diff interpretation   Final    Results are indeterminate. Please contact the provider listed for your campus for C diff questions in AMION.    Comment: Performed at [facility], [address], [place], [place] [address]  Culture, blood (Routine X 2) w Reflex to ID Panel     Status: None   Collection Time: 09/13/23  9:56 PM   Specimen: BLOOD  Result Value Ref Range Status   Specimen Description BLOOD BLOOD LEFT ARM  Final   Special Requests   Final    BOTTLES DRAWN AEROBIC AND ANAEROBIC Blood  Culture adequate volume   Culture   Final    NO GROWTH 5 DAYS Performed at [facility], [address], [place], [place] [address]    Report Status 09/18/2023 FINAL  Final  Culture, blood (Routine X 2) w Reflex to ID Panel     Status: None   Collection Time: 09/13/23  9:57 PM   Specimen: BLOOD LEFT HAND  Result Value Ref Range Status   Specimen Description BLOOD LEFT HAND  Final   Special Requests   Final    BOTTLES DRAWN AEROBIC AND ANAEROBIC Blood Culture adequate volume   Culture   Final    NO GROWTH 5 DAYS Performed at [facility], [address], [place], [place] [address]    Report Status 09/18/2023 FINAL  Final  C. Diff by PCR     Status: Abnormal   Collection Time: 09/14/23  2:40 PM   Specimen: STOOL  Result Value Ref Range Status   Toxigenic C. Difficile by PCR POSITIVE (Myisha Pickerel) NEGATIVE Final    Comment: Positive for toxigenic C. difficile with little to no toxin production. Only treat if clinical presentation suggests symptomatic illness. Performed at [facility], [address] [place], [place] [address]   Urine Culture (for pregnant, neutropenic or urologic patients or patients with an indwelling urinary catheter)     Status: None   Collection Time: 09/16/23 11:00 AM   Specimen: Urine, Clean Catch  Result Value Ref Range Status   Specimen Description URINE, CLEAN CATCH  Final   Special Requests NONE  Final   Culture   Final    NO GROWTH Performed at [facility], [address], [place], [place] [address]  Report Status 09/17/2023 FINAL  Final         Radiology Studies: No results found.      Scheduled Meds:  acetaminophen       [MAR Hold] amoxicillin-clavulanate  1 tablet Oral Q12H   [MAR Hold] aspirin  81 mg Oral Daily   [START ON 09/20/2023] aspirin  81 mg Oral Daily   Or   [START ON 09/20/2023] aspirin  81 mg Per Tube Daily   [MAR Hold] enoxaparin (LOVENOX) injection  40 mg Subcutaneous Q24H   [MAR Hold] insulin aspart   0-9 Units Subcutaneous TID WC   [MAR Hold] lisinopril  10 mg Oral Daily   [MAR Hold] nebivolol  5 mg Oral Daily   oxyCODONE       [MAR Hold] rosuvastatin  20 mg Oral Daily   [MAR Hold] ticagrelor  90 mg Oral BID   ticagrelor  90 mg Oral BID   Or   ticagrelor  90 mg Per Tube BID   [MAR Hold] traZODone  50 mg Oral QHS   [MAR Hold] vancomycin  125 mg Oral QID   Continuous Infusions:  sodium chloride     clevidipine     lactated ringers        LOS: 10 days    Time spent: over 30 min    Lacretia Nicks, MD [facility]   To contact the attending provider between 7A-7P or the covering provider during after hours 7P-7A, please log into the web site [email or web address] and access using universal [place] password for that web site. If you do not have the password, please call the hospital operator.  09/19/2023, 2:16 PM

## 2023-09-19 NOTE — Plan of Care (Signed)

## 2023-09-19 NOTE — TOC Benefit Eligibility Note (Signed)
Patient Product/process development scientist completed.    The patient is insured through [employer]. Patient has Medicare and is not eligible for a copay card, but may be able to apply for patient assistance, if available.    Ran test claim for Brilinta 90 mg and the current 30 day co-pay is $0.00.   This test claim was processed through [facility]- copay amounts may vary at other pharmacies due to pharmacy/plan contracts, or as the patient moves through the different stages of their insurance plan.     Roland Earl, CPHT Pharmacy Technician III Certified Patient Advocate [facility] Patient Advocate Team Direct Number: [phone]  Fax: [phone]

## 2023-09-19 NOTE — Progress Notes (Signed)
PT Cancellation Note  Patient Details Name: MOUNA YAGER MRN: [id number] DOB: February 22, 1946   Cancelled Treatment:    Reason Eval/Treat Not Completed: Patient at procedure or test/unavailable  Off unit for procedure with IR. Will follow.   Kathlyn Sacramento, PT, DPT [facility]  Rehabilitation Services Physical Therapist Office: [phone] Website: [email or web address]  Berton Mount 09/19/2023, 9:16 AM

## 2023-09-19 NOTE — Anesthesia Procedure Notes (Signed)
Procedure Name: Intubation Date/Time: 09/19/2023 8:31 AM  Performed by: Darryl Nestle, CRNAPre-anesthesia Checklist: Patient identified, Emergency Drugs available, Suction available and Patient being monitored Patient Re-evaluated:Patient Re-evaluated prior to induction Oxygen Delivery Method: Circle system utilized Preoxygenation: Pre-oxygenation with 100% oxygen Induction Type: IV induction Ventilation: Mask ventilation without difficulty Laryngoscope Size: Mac Grade View: Grade I Tube type: Oral Tube size: 6.5 mm Number of attempts: 1 Airway Equipment and Method: Stylet and Oral airway Placement Confirmation: ETT inserted through vocal cords under direct vision, positive ETCO2 and breath sounds checked- equal and bilateral Secured at: 22 cm Tube secured with: Tape Dental Injury: Teeth and Oropharynx as per pre-operative assessment

## 2023-09-19 NOTE — Anesthesia Procedure Notes (Signed)
Arterial Line Insertion Start/End10/06/2023 7:00 AM, 09/19/2023 7:05 AM Performed by: Darryl Nestle, CRNA, CRNA  Patient location: Pre-op. Preanesthetic checklist: patient identified, IV checked, site marked, risks and benefits discussed, surgical consent, monitors and equipment checked, pre-op evaluation, timeout performed and anesthesia consent Lidocaine 1% used for infiltration Left, radial was placed Catheter size: 20 G Hand hygiene performed  and maximum sterile barriers used   Attempts: 1 Procedure performed using ultrasound guided technique. Ultrasound Notes:anatomy identified, needle tip was noted to be adjacent to the nerve/plexus identified and no ultrasound evidence of intravascular and/or intraneural injection Following insertion, dressing applied and Biopatch. Post procedure assessment: normal and unchanged  Patient tolerated the procedure well with no immediate complications.

## 2023-09-19 NOTE — H&P (Signed)
Patient Status: [facility] - In-pt  Assessment and Plan: Basilar artery stenosis Patient with prior ICA stenosis and stent placement now with basilar artery stenosis, symptomatic on admission.  After discussion with Dr. Corliss Skains she is agreeable to proceed with intervention prior to discharge.  This has been scheduled for today with anesthesia.  NPO this AM.  Taking DAPT.  VSS.  Clinically improved from several recent illnesses.   Risks and benefits of cerebral angiogram with intervention were discussed with the patient including, but not limited to bleeding, infection, vascular injury, contrast induced renal failure, stroke or even death.  This interventional procedure involves the use of X-rays and because of the nature of the planned procedure, it is possible that we will have prolonged use of X-ray fluoroscopy.  Potential radiation risks to you include (but are not limited to) the following: - A slightly elevated risk for cancer  several years later in life. This risk is typically less than 0.5% percent. This risk is low in comparison to the normal incidence of human cancer, which is 33% for women and 50% for men according to the American Cancer Society. - Radiation induced injury can include skin redness, resembling a rash, tissue breakdown / ulcers and hair loss (which can be temporary or permanent).   The likelihood of either of these occurring depends on the difficulty of the procedure and whether you are sensitive to radiation due to previous procedures, disease, or genetic conditions.   IF your procedure requires a prolonged use of radiation, you will be notified and given written instructions for further action.  It is your responsibility to monitor the irradiated area for the 2 weeks following the procedure and to notify your physician if you are concerned that you have suffered a radiation induced injury.    All of the patient's questions were answered, patient is agreeable to  proceed.  Consent signed and in chart.  ______________________________________________________________________   History of Present Illness: [name] is a 77 y.o. female with past medical hsitory of DM, fibromyalgia, HTN, diverticulitis, s/p L ICA stent placement by Vascular surgery in 2021 who developed acute nasuea, vomiting, and lower extremity weakness. She presented to the hospital with ongoing right-sided symptoms. Preliminary imaging negative for acute stroke, however due to ongoing concern for status of known prior stenosis and stent, diagnostic angiogram has been recommended by Neurology. Diagnostic angiogram performed 9/27 and showed severe stenosis of the distal basilar artery.  Intervention recommended and discussed with patient who elects to proceed.  Benefits, risks, and goals discussed at length with patient and her family.   Allergies and medications reviewed.   Review of Systems: A 12 point ROS discussed and pertinent positives are indicated in the HPI above.  All other systems are negative.  Review of Systems  Constitutional:  Negative for fatigue and fever.  Respiratory:  Negative for cough and shortness of breath.   Cardiovascular:  Negative for chest pain.  Gastrointestinal:  Negative for abdominal pain, nausea and vomiting.  Genitourinary:  Negative for dysuria.  Musculoskeletal:  Negative for back pain.  Psychiatric/Behavioral:  Negative for behavioral problems and confusion.     Vital Signs: BP (!) 113/47 (BP Location: Right Arm)   Pulse 72   Temp 98 F (36.7 C) (Oral)   Resp 16   Ht 5\' 2"  (1.575 m)   Wt 145 lb (65.8 kg)   SpO2 100%   BMI 26.52 kg/m   Physical Exam Vitals and nursing note reviewed.  Constitutional:  General: She is not in acute distress.    Appearance: Normal appearance. She is not ill-appearing.  HENT:     Mouth/Throat:     Mouth: Mucous membranes are moist.     Pharynx: Oropharynx is clear.  Cardiovascular:     Rate  and Rhythm: Normal rate and regular rhythm.  Pulmonary:     Effort: Pulmonary effort is normal.     Breath sounds: Normal breath sounds.  Abdominal:     General: Abdomen is flat.     Palpations: Abdomen is soft.  Musculoskeletal:        General: Normal range of motion.  Skin:    General: Skin is warm and dry.  Neurological:     General: No focal deficit present.     Mental Status: She is alert and oriented to person, place, and time. Mental status is at baseline.  Psychiatric:        Mood and Affect: Mood normal.        Behavior: Behavior normal.        Thought Content: Thought content normal.        Judgment: Judgment normal.      Imaging reviewed.   Labs:  COAGS: Recent Labs    08/07/23 0944 09/09/23 0539  INR 0.9 1.0  APTT 26 22*    BMP: Recent Labs    09/13/23 0623 09/14/23 1456 09/15/23 0745 09/16/23 0703  NA 138 136 135 137  K 4.5 3.8 3.1* 4.3  CL 107 105 103 108  CO2 25 24 23  21*  GLUCOSE 105* 102* 113* 115*  BUN 6* 6* 5* 5*  CALCIUM 8.9 8.6* 8.3* 8.4*  CREATININE 0.86 0.77 0.83 0.58  GFRNONAA >60 >60 >60 >60       Electronically Signed: Hoyt Koch, PA 09/19/2023, 7:28 AM   I spent a total of 15 minutes in face to face in clinical consultation, greater than 50% of which was counseling/coordinating care for basilar artery stenosis.

## 2023-09-19 NOTE — Transfer of Care (Signed)
Immediate Anesthesia Transfer of Care Note  Patient: [name]  Procedure(s) Performed: ANGIOPLASTY/STENT OF BASILAR ARTERY WITH ANESTHESIA  Patient Location: PACU  Anesthesia Type:General  Level of Consciousness: awake, alert , and oriented  Airway & Oxygen Therapy: Patient Spontanous Breathing  Post-op Assessment: Report given to RN, Post -op Vital signs reviewed and stable, and Patient moving all extremities X 4  Post vital signs: Reviewed and stable  Last Vitals:  Vitals Value Taken Time  BP 124/55 09/19/23 0950  Temp 98   Pulse 80 09/19/23 0953  Resp 19 09/19/23 0953  SpO2 96 % 09/19/23 0953  Vitals shown include unfiled device data.  Last Pain:  Vitals:   09/19/23 0705  TempSrc:   PainSc: 0-No pain      Patients Stated Pain Goal: 0 (09/18/23 1631)  Complications: No notable events documented.

## 2023-09-19 NOTE — Anesthesia Postprocedure Evaluation (Signed)
Anesthesia Post Note  Patient: [name]  Procedure(s) Performed: ANGIOPLASTY/STENT OF BASILAR ARTERY WITH ANESTHESIA     Patient location during evaluation: PACU Anesthesia Type: General Level of consciousness: awake and alert Pain management: pain level controlled Vital Signs Assessment: post-procedure vital signs reviewed and stable Respiratory status: spontaneous breathing, nonlabored ventilation, respiratory function stable and patient connected to nasal cannula oxygen Cardiovascular status: stable and blood pressure returned to baseline Anesthetic complications: no   No notable events documented.  Last Vitals:  Vitals:   09/19/23 1115 09/19/23 1130  BP: (!) 132/51 (!) 121/50  Pulse: 71 74  Resp: 17 17  Temp:    SpO2: 100% 99%    Last Pain:  Vitals:   09/19/23 1115  TempSrc:   PainSc: 0-No pain                 Beryle Lathe

## 2023-09-19 NOTE — Sedation Documentation (Signed)
Patient arrived to IR 3 with anesthesia.  Connected to the monitor, safety straps in place.  Anesthesia to monitor.

## 2023-09-19 NOTE — Procedures (Signed)
INR.  Status post right vertebral artery angiogram.  Right CFA approach.  Findings.  1.  Interval complete revascularization of previously occluded distal basilar artery, the right posterior cerebral artery and the superior cerebellar arteries.  Manual compression  held at the right groin puncture site  with quick clot for  20 minutes..  Patient extubated.  Denies any headaches, nausea, vomiting or visual symptoms.  Following commands appropriately.  Moving all fours equally.  Fatima Sanger MD.

## 2023-09-20 ENCOUNTER — Other Ambulatory Visit (HOSPITAL_COMMUNITY): Payer: Self-pay

## 2023-09-20 DIAGNOSIS — G459 Transient cerebral ischemic attack, unspecified: Secondary | ICD-10-CM | POA: Diagnosis not present

## 2023-09-20 LAB — GLUCOSE, CAPILLARY
Glucose-Capillary: 116 mg/dL — ABNORMAL HIGH (ref 70–99)
Glucose-Capillary: 146 mg/dL — ABNORMAL HIGH (ref 70–99)

## 2023-09-20 LAB — COMPREHENSIVE METABOLIC PANEL
ALT: 25 U/L (ref 0–44)
AST: 22 U/L (ref 15–41)
Albumin: 2.8 g/dL — ABNORMAL LOW (ref 3.5–5.0)
Alkaline Phosphatase: 60 U/L (ref 38–126)
Anion gap: 8 (ref 5–15)
BUN: 13 mg/dL (ref 8–23)
CO2: 21 mmol/L — ABNORMAL LOW (ref 22–32)
Calcium: 8.2 mg/dL — ABNORMAL LOW (ref 8.9–10.3)
Chloride: 107 mmol/L (ref 98–111)
Creatinine, Ser: 0.68 mg/dL (ref 0.44–1.00)
GFR, Estimated: >60 mL/min (ref >60)
Glucose, Bld: 143 mg/dL — ABNORMAL HIGH (ref 70–99)
Potassium: 4.1 mmol/L (ref 3.5–5.1)
Sodium: 136 mmol/L (ref 135–145)
Total Bilirubin: 0.6 mg/dL (ref 0.3–1.2)
Total Protein: 5.2 g/dL — ABNORMAL LOW (ref 6.5–8.1)

## 2023-09-20 LAB — MAGNESIUM: Magnesium: 1.6 mg/dL — ABNORMAL LOW (ref 1.7–2.4)

## 2023-09-20 LAB — PHOSPHORUS: Phosphorus: 3.4 mg/dL (ref 2.5–4.6)

## 2023-09-20 MED ORDER — LISINOPRIL 10 MG PO TABS
10.0000 mg | ORAL_TABLET | Freq: Every day | ORAL | 0 refills | Status: AC
  Filled 2023-09-20: qty 90, 90d supply, fill #0

## 2023-09-20 MED ORDER — ROSUVASTATIN CALCIUM 20 MG PO TABS
20.0000 mg | ORAL_TABLET | Freq: Every day | ORAL | 0 refills | Status: AC
  Filled 2023-09-20: qty 90, 90d supply, fill #0

## 2023-09-20 MED ORDER — AMOXICILLIN-POT CLAVULANATE 875-125 MG PO TABS
1.0000 | ORAL_TABLET | Freq: Two times a day (BID) | ORAL | 0 refills | Status: AC
  Filled 2023-09-20: qty 8, 4d supply, fill #0

## 2023-09-20 MED ORDER — DIPHENOXYLATE-ATROPINE 2.5-0.025 MG PO TABS: 1.0000 | ORAL_TABLET | Freq: Four times a day (QID) | ORAL | Status: AC | PRN

## 2023-09-20 MED ORDER — SODIUM CHLORIDE 0.9% FLUSH
10.0000 mL | Freq: Two times a day (BID) | INTRAVENOUS | Status: DC
  Administered 2023-09-20: 10 mL via INTRAVENOUS

## 2023-09-20 MED ORDER — MAGNESIUM SULFATE 2 GM/50ML IV SOLN
2.0000 g | Freq: Once | INTRAVENOUS | Status: AC
  Administered 2023-09-20: 2 g via INTRAVENOUS
  Filled 2023-09-20: qty 50

## 2023-09-20 MED ORDER — TICAGRELOR 90 MG PO TABS
90.0000 mg | ORAL_TABLET | Freq: Two times a day (BID) | ORAL | 0 refills | Status: AC
  Filled 2023-09-20: qty 60, 30d supply, fill #0
  Filled 2023-09-20: qty 180, 90d supply, fill #0

## 2023-09-20 MED ORDER — VANCOMYCIN HCL 125 MG PO CAPS
125.0000 mg | ORAL_CAPSULE | Freq: Four times a day (QID) | ORAL | 0 refills | Status: AC
Start: 2023-09-20 — End: 2023-10-01
  Filled 2023-09-20: qty 24, 6d supply, fill #0
  Filled 2023-09-20: qty 20, 5d supply, fill #0

## 2023-09-20 MED ORDER — AMPHETAMINE-DEXTROAMPHET ER 20 MG PO CP24: 40.0000 mg | ORAL_CAPSULE | Freq: Every morning | ORAL | Status: AC

## 2023-09-20 NOTE — TOC Transition Note (Signed)
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 PT s/p 10/7 R vertebral artery angiogram revascularization.  PTA, pt independent and living at home with spouse, who can provide needed assistance at dc.  PT/OT and ST recommending OP follow up; referral to [facility] for continued therapies.    Final next level of care: OP Rehab Barriers to Discharge: Barriers Resolved   Patient Goals and CMS Choice CMS Medicare.gov Compare Post Acute Care list provided to:: Patient Represenative (must comment) Choice offered to / list presented to : Patient, Spouse                        Discharge Plan and Services Additional resources added to the After Visit Summary for     Discharge Planning Services: CM Consult                                 Social Determinants of Health (SDOH) Interventions SDOH Screenings   Food Insecurity: No Food Insecurity (09/09/2023)  Housing: Low Risk  (09/09/2023)  Transportation Needs: No Transportation Needs (09/09/2023)  Utilities: Not At Risk (09/09/2023)  Tobacco Use: High Risk (09/19/2023)     Readmission Risk Interventions    08/10/2023    6:17 PM  Readmission Risk Prevention Plan  Post Dischage Appt Complete  Medication Screening Complete  Transportation Screening Complete   Quintella Baton, RN, BSN  Trauma/Neuro ICU Case Manager [phone]

## 2023-09-20 NOTE — Plan of Care (Signed)
Patient admitted s/p VIR procedure. Patient's VSS. Patient ambulatory and able to walk with standby assist around unit. Patient discharged home with teaching, patient and husband updated in plan of care.   Problem: Education: Goal: Knowledge of General Education information will improve Description: Including pain rating scale, medication(s)/side effects and non-pharmacologic comfort measures Outcome: Adequate for Discharge   Problem: Health Behavior/Discharge Planning: Goal: Ability to manage health-related needs will improve Outcome: Adequate for Discharge   Problem: Clinical Measurements: Goal: Ability to maintain clinical measurements within normal limits will improve Outcome: Adequate for Discharge Goal: Will remain free from infection Outcome: Adequate for Discharge Goal: Diagnostic test results will improve Outcome: Adequate for Discharge Goal: Respiratory complications will improve Outcome: Adequate for Discharge Goal: Cardiovascular complication will be avoided Outcome: Adequate for Discharge   Problem: Activity: Goal: Risk for activity intolerance will decrease Outcome: Adequate for Discharge   Problem: Nutrition: Goal: Adequate nutrition will be maintained Outcome: Adequate for Discharge   Problem: Coping: Goal: Level of anxiety will decrease Outcome: Adequate for Discharge   Problem: Elimination: Goal: Will not experience complications related to bowel motility Outcome: Adequate for Discharge Goal: Will not experience complications related to urinary retention Outcome: Adequate for Discharge   Problem: Pain Managment: Goal: General experience of comfort will improve Outcome: Adequate for Discharge   Problem: Safety: Goal: Ability to remain free from injury will improve Outcome: Adequate for Discharge   Problem: Skin Integrity: Goal: Risk for impaired skin integrity will decrease Outcome: Adequate for Discharge   Problem: Education: Goal: Understanding  of CV disease, CV risk reduction, and recovery process will improve Outcome: Adequate for Discharge Goal: Individualized Educational Video(s) Outcome: Adequate for Discharge   Problem: Activity: Goal: Ability to return to baseline activity level will improve Outcome: Adequate for Discharge   Problem: Cardiovascular: Goal: Ability to achieve and maintain adequate cardiovascular perfusion will improve Outcome: Adequate for Discharge Goal: Vascular access site(s) Level 0-1 will be maintained Outcome: Adequate for Discharge   Problem: Health Behavior/Discharge Planning: Goal: Ability to safely manage health-related needs after discharge will improve Outcome: Adequate for Discharge   Problem: Education: Goal: Ability to describe self-care measures that may prevent or decrease complications (Diabetes Survival Skills Education) will improve Outcome: Adequate for Discharge Goal: Individualized Educational Video(s) Outcome: Adequate for Discharge   Problem: Coping: Goal: Ability to adjust to condition or change in health will improve Outcome: Adequate for Discharge   Problem: Fluid Volume: Goal: Ability to maintain a balanced intake and output will improve Outcome: Adequate for Discharge   Problem: Health Behavior/Discharge Planning: Goal: Ability to identify and utilize available resources and services will improve Outcome: Adequate for Discharge Goal: Ability to manage health-related needs will improve Outcome: Adequate for Discharge   Problem: Metabolic: Goal: Ability to maintain appropriate glucose levels will improve Outcome: Adequate for Discharge   Problem: Nutritional: Goal: Maintenance of adequate nutrition will improve Outcome: Adequate for Discharge Goal: Progress toward achieving an optimal weight will improve Outcome: Adequate for Discharge   Problem: Skin Integrity: Goal: Risk for impaired skin integrity will decrease Outcome: Adequate for Discharge    Problem: Tissue Perfusion: Goal: Adequacy of tissue perfusion will improve Outcome: Adequate for Discharge

## 2023-09-20 NOTE — Discharge Summary (Signed)
Physician Discharge Summary  [name] ZOX:[id number] DOB: 1946/01/05 DOA: 09/09/2023  PCP: Marguarite Arbour, MD  Admit date: 09/09/2023 Discharge date: 09/20/2023  Time spent: 40 minutes  Recommendations for Outpatient Follow-up:  Follow outpatient CBC/CMP  DAPT with aspirin and brilinta per neuro IR - follow up  with neuro ir and neurology Follow with GI for ileitis/colitis (question of endoscopy/colonoscopy outpatient) - c diff treatment   Follow with vascular surgery outpatient for SMA/celiac artery stenosis  STOP jardiance with emphysematous cystitis  Get clearance from neurology with regards to continued adderall use Recent c diff infection, caution with abx in future   Discharge Diagnoses:  Principal Problem:   TIA (transient ischemic attack) Active Problems:   Chronic diarrhea   Abnormal CT scan, colon   Occlusion and stenosis of basilar artery   Discharge Condition: stable  Diet recommendation: heart healthy   Filed Weights   09/09/23 0410  Weight: 65.8 kg    History of present illness:   77 y.o. female with medical history significant of HTN, HLD, diverticulosis, DM2, ETOH abuse in remission, right ICA stenosis status post stenting, interim history of admission 8/25/-8/29 for lower gi bleed presumed diverticular with associated hemorrhagic shock which required icu admission while on DAPT.   Patient course further complicated by appendectomy 2 weeks ago.   She presented with malaise over the last few days with n/v/d and abdominal  pain. However on waking this am around 2 am with  [name] bout of n/v she noted weakness/numbness in right leg. Due to this EMS was called.   She had an MRI without acute ischemia, but CTA head/neck showed distal basilar artery occlusion or near occlusion (see report).  Angio 10/1 showed interval development of severe distal basilar artery stenosis.  Hospitalization was complicated by abdominal pain with imaging suggestive of emphysematous  cystitis and distal ileitis colitis.  She was treated for UTI and c diff.  Now s/p R vertebral artery angiogram with interval complete revascularization of previously occluded distal basilar artery  , posterior cerebral artery, and superior cerebellar arteries (without intervention).  Stable for discharge on 10/8 with outpatient follow up.  See below for additional details.  Hospital Course:  Assessment and Plan:  TIA - suspected due to basilar artery stenosis in setting of dehydration with vomiting/diarrhea - CTA head/neck with distal basilar artery occlusion or near occlusion, nonvisualization of distal 3-4 mm of basilar (new since 2012), involvement of R PCA origin, but right PCA is reconstituted.  SCA's not identified. Improved cervical L ICA following stenting.  High grade contralateral proximal R ICA stenosis. - MRI brain without acute ischemia - MRA with poor flow/occlusion of basilar artery tip and R PCA origin  - was on aspirin/plavix prior to admission -> ASA/brilinta in preparation of stenting - will repeat urine culture 10/4 - no growth - crestor  - A1c 5.7, LDL 117 - now is s/p R vertebral artery angiogram, with interval complete revascularization of previously occluded distal basilar artery, the posterior cerebral artery and the superior cerebellar arteries, 10/7.  to neuro ICU post op per neuro interventional.  Discharge per neuro interventional recs, recommending continued aspirin and brilinta at discharge, follow for reimaging in 3 months.        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 -blood cultures NGTDx2  -transition to augmentin, 10 days augmentin   Mild thickening and inflammation of ileocecal junction and proximal ascending colon Distal ileitis/colitis (infectious vs inflammatory vs ischemic?) C diff antigen positive, negative toxin ->  PCR positive, treating with vancomycin -> continue 7 days post augmentin Negative GI path panel  Appreciate surgery  assistance with recent appendectomy 2 weeks ago -> recommending vascular surgery c/s with regards to question of stenoses of mesenteric vessels potentially contributing to presentation  Appreciate GI assistance -> r/o c diff, no endoscopy/colonoscopy currently recommended -> needs outpatient Vascular has been consulted (as below)   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%  Appreciate vascular recommendations -> follow outpatient    Hypokalemia -Replete   Diabetes mellitus type 2 -Continue sliding scale insulin with NovoLog -at home on trulicity, metformin  - stop jardiance given emphysematous cystitis   Recent history of GI bleed -trend Hb/Hct -> recently stable   Hypertension -Continue nebivolol -lisinopril 10  -follow blood pressure outpatient and adjust as needed to goal    Anxiety Received xanax while here   Diarrhea -She has history of chronic diarrhea -now being treated for c diff as above -diarrhea improved      Procedures:    Consultations: Neuro IR GI Surgery Vascular  neurology  Discharge Exam: Vitals:   09/20/23 1000 09/20/23 1100  BP: (!) 137/56 (!) 151/127  Pulse: 64 73  Resp: 17 (!) 23  Temp:    SpO2: 97% 96%   Discussed d/c plan See exam from this morning  Discharge Instructions   Discharge Instructions     Ambulatory referral to Neurology   Complete by: As directed    Follow up with stroke clinic NP at [facility] in about 4 weeks. Thanks.   Call MD for:  difficulty breathing, headache or visual disturbances   Complete by: As directed    Call MD for:  extreme fatigue   Complete by: As directed    Call MD for:  hives   Complete by: As directed    Call MD for:  persistant dizziness or light-headedness   Complete by: As directed    Call MD for:  persistant nausea and vomiting   Complete by: As directed    Call MD for:  redness, tenderness, or signs of infection (pain, swelling, redness, odor or green/yellow discharge  around incision site)   Complete by: As directed    Call MD for:  severe uncontrolled pain   Complete by: As directed    Call MD for:  temperature >100.4   Complete by: As directed    Diet - low sodium heart healthy   Complete by: As directed    Discharge instructions   Complete by: As directed    You were seen with Leon Goodnow TIA (ministroke).  You had an angiogram which showed severe basilar artery stenosis.  You had Azaiah Mello procedure on 10/7 with neuro interventional radiology that showed interval revascularization of the distal basilar artery.  We'll continue you on aspirin and brilinta.  Follow with neurology outpatient for instructions on the antiplatelets (aspirin and brilinta) and refills.  Follow with neuro interventional radiology in 3 months for repeat imaging.  Follow with neurology outpatient for your TIA (ministroke).  You had emphysematous cystitis (Kenleigh Toback complicated UTI) that we've treated with antibiotics.  We'll discharge you home with another 4 days of antibiotics.  STOP your jardiance because this increases your risk of UTI's.    You had inflammation in your colon (colitis) as well as Nehemie Casserly C difficile infection.  We're treating you with oral vancomycin.  Continue this for 7 days after you complete your augmentin.  In the future, if you need antibiotics, you should discuss your  diagnosis of C diff in October of 2024 so they can take precautions to avoid Kealii Thueson recurrence.  You'll need to follow up with gastroenterology outpatient for endoscopy and colonoscopy.  You'll need to follow with vascular surgery outpatient for your superior mesenteric artery and celiac artery stenosis.  We increased your lisinopril to 10 mg.  Continue your bisoprolol.  Follow with your outpatient provider your blood pressures and adjust as indicated.  Follow with surgery outpatient as recommended for your appendectomy.  Ask your neurologist about adderall before restarting this.    Return for new, recurrent, or worsening  symptoms.  Please ask your PCP to request records from this hospitalization so they know what was done and what the next steps will be.   Discharge wound care:   Complete by: As directed    Per neuro interventional   Increase activity slowly   Complete by: As directed       Allergies as of 09/20/2023       Reactions   Diflucan [fluconazole] Nausea And Vomiting   Latex Hives   Lipitor [atorvastatin Calcium] Other (See Comments)   Muscle aches   Codeine Rash   And nausea   Pentazocine Lactate Rash   Sulfonamide Derivatives Rash        Medication List     STOP taking these medications    clopidogrel 75 MG tablet Commonly known as: PLAVIX   Jardiance 25 MG Tabs tablet Generic drug: empagliflozin       TAKE these medications    accu-chek multiclix lancets Use to test blood sugar 3 times daily as instructed. Dx: E11.65   amoxicillin-clavulanate 875-125 MG tablet Commonly known as: AUGMENTIN Take 1 tablet by mouth every 12 (twelve) hours for 4 days.   amphetamine-dextroamphetamine 20 MG 24 hr capsule Commonly known as: ADDERALL XR Take 2 capsules (40 mg total) by mouth every morning. Discuss with neurology before resuming this medicine in the setting of your mini stroke (don't take until you discuss with neurology) What changed: additional instructions   aspirin EC 81 MG tablet Take 1 tablet (81 mg total) by mouth daily. Swallow whole.   B Complex-B12 Tabs Take 1 tablet by mouth at bedtime.   Bystolic 5 MG tablet Generic drug: nebivolol TAKE 1 TABLET DAILY What changed: how much to take   diphenoxylate-atropine 2.5-0.025 MG tablet Commonly known as: LOMOTIL Take 1 tablet by mouth 4 (four) times daily as needed for diarrhea or loose stools. Avoid use until you complete therapy for your C diff infection (until completion of oral vancomycin) What changed: additional instructions   EPINEPHrine 0.3 mg/0.3 mL Soaj injection Commonly known as: EPI-PEN Inject  0.3 mg into the muscle as needed for anaphylaxis.   glucose blood test strip Commonly known as: Bayer Contour Next Test Use to test blood sugar 3 times daily as instructed. Dx code: E11.65   lisinopril 10 MG tablet Commonly known as: ZESTRIL Take 1 tablet (10 mg total) by mouth daily. Follow your blood pressures with your outpatient provider for refills What changed:  medication strength how much to take additional instructions   metFORMIN 1000 MG tablet Commonly known as: GLUCOPHAGE Take 1,000 mg by mouth every evening.   omeprazole 40 MG capsule Commonly known as: PRILOSEC Take 40 mg by mouth daily.   pregabalin 300 MG capsule Commonly known as: LYRICA Take 300 mg by mouth 2 (two) times daily.   rosuvastatin 20 MG tablet Commonly known as: CRESTOR Take 1 tablet (20 mg total)  by mouth daily. Start taking on: September 21, 2023   ticagrelor 90 MG Tabs tablet Commonly known as: BRILINTA Take 1 tablet (90 mg total) by mouth 2 (two) times daily. Follow up with neurology for refills   Trulicity 3 MG/0.5ML Sopn Generic drug: Dulaglutide Inject 3 mg into the skin once Elye Harmsen week.   vancomycin 125 MG capsule Commonly known as: VANCOCIN Take 1 capsule (125 mg total) by mouth 4 (four) times daily for 11 days. If you need antibiotics in the future, please let your doctor know about your history of C diff               Discharge Care Instructions  (From admission, onward)           Start     Ordered   09/20/23 0000  Discharge wound care:       Comments: Per neuro interventional   09/20/23 1157           Allergies  Allergen Reactions   Diflucan [Fluconazole] Nausea And Vomiting   Latex Hives   Lipitor [Atorvastatin Calcium] Other (See Comments)    Muscle aches   Codeine Rash    And nausea    Pentazocine Lactate Rash   Sulfonamide Derivatives Rash    Follow-up Information     [facility]. Schedule an appointment as soon as  possible for Stephen Turnbaugh visit in 1 month(s).   Specialty: Neurology Why: stroke clinic Contact information: [address] [place] [address] [phone]                 The results of significant diagnostics from this hospitalization (including imaging, microbiology, ancillary and laboratory) are listed below for reference.    Significant Diagnostic Studies: IR ANGIO VERTEBRAL SEL VERTEBRAL UNI R MOD SED  Result Date: 09/20/2023 CLINICAL DATA:  History of vertebrobasilar ischemia. Pre occlusive distal basilar artery extending into the right posterior cerebral artery P1 segment on recent diagnostic catheter arteriogram. EXAM: IR ANGIO VERTEBRAL SEL VERTEBRAL UNI RIGHT MOD SED COMPARISON:  Recent diagnostic arteriogram of September 09, 2023. MEDICATIONS: Heparin 3,000 units IV. No antibiotic was administered within 1 hour of the procedure. ANESTHESIA/SEDATION: General anesthesia. CONTRAST:  Omnipaque 300 approximately 50 mL. FLUOROSCOPY TIME:  Fluoroscopy Time: 7 minutes 0 seconds (136 mGy). COMPLICATIONS: None immediate. TECHNIQUE: Informed written consent was obtained from the patient after Jeananne Bedwell thorough discussion of the procedural risks, benefits and alternatives. All questions were addressed. Maximal Sterile Barrier Technique was utilized including caps, mask, sterile gowns, sterile gloves, sterile drape, hand hygiene and skin antiseptic. Bassel Gaskill timeout was performed prior to the initiation of the procedure. The right groin was prepped and draped in the usual sterile fashion. Thereafter using modified Seldinger technique, transfemoral access into the right common femoral artery was obtained without difficulty. Over an 0.035 inch guidewire, an 8 French 25 cm Pinnacle sheath was inserted. Through this, and also over an 0.035 inch guidewire, Oliver Heitzenrater 5 [place] JB 1 catheter was advanced to the aortic arch region and selectively positioned in right subclavian artery distal to the origin of  the right vertebral artery. The guidewire was removed good. Good aspiration was obtained from the hub of the diagnostic catheter. FINDINGS: The 5 French diagnostic catheter in the distal right subclavian artery was then exchange for an 035 inch 300 cm Rosen exchange guidewire in the distal right subclavian artery. Over this, Brianne Maina combination of Latayna Ritchie 5 [place] JB 1 catheter inside of an 088 80  cm micro sheath was advanced to just proximal to the origin of the right vertebral artery. The exchange wire was removed. Good aspiration was obtained from the hub of the diagnostic catheter. Roadmap was then performed through the JB 1 catheter in the origin of the right vertebral artery centered extra cranially. This combination was then advanced to the distal right vertebral artery over an 035 inch Roadrunner guidewire. The guidewire, and the diagnostic catheter were removed. Good aspiration obtained from the hub of the sheath. Felicia Bloomquist gentle control arteriogram performed through this demonstrated moderate spasm of the vertebral artery which responded to 25 mcg of nitroglycerin. Arteriograms were then performed centered intracranially. Multiple oblique magnified views were obtained. These demonstrated wide patency of the right vertebrobasilar junction and the right posterior-inferior cerebellar artery. The distal basilar artery was now fully patent with approximately 30% stenosis at the origin of the right superior cerebellar artery. Complete patency was now present of the right posterior cerebral artery into the distal distribution. Patency was also seen of the superior cerebellar arteries bilaterally, including the anterior-inferior cerebellar arteries. Given the completely revascularized distal basilar artery, and the right posterior artery proximally, the procedure was stopped. Manual compression for 25 minutes was then held at the right groin puncture site. Distal pulses remained palpable in both feet unchanged. The patient's general  anesthesia was reversed, and the patient was extubated. Upon recovery, the patient denied any headaches, nausea or vomiting or visual symptoms. She was able to move all fours equally. She was then transferred to the PACU and then neuro ICU. IMPRESSION: Interval complete revascularization of the previously noted severe pre occlusive distal basilar artery and the proximal P1 segment with Simi Briel suggestion of mild stenosis just distal to the origin of the right superior cerebellar artery. PLAN: Follow-up in the clinic in 3 months with CT angiogram of the head and neck. Electronically Signed   By: Julieanne Cotton M.D.   On: 09/20/2023 08:28   VAS [place] MESENTERIC  Result Date: 09/15/2023 ABDOMINAL VISCERAL Patient Name:  Charlene PALMATIER  Date of Exam:   09/15/2023 Medical Rec #: [id number]        Accession #:    [id number] Date of Birth: July 02, 1946        Patient Gender: F Patient Age:   61 years Exam Location:  [facility] Procedure:      VAS [place] MESENTERIC Referring Phys: [id number] [facility] -------------------------------------------------------------------------------- Indications: Evaluate for mesenteric ischemia. Chronic history of colitis. High Risk Factors: Hypertension, hyperlipidemia, Diabetes. Other Factors: Status post appendectomy 3 weeks ago.                Fibromyalgia. Comparison Study: No priors. Performing Technologist: Marilynne Halsted RDMS, RVT  Examination Guidelines: Elasha Tess complete evaluation includes B-mode imaging, spectral Doppler, color Doppler, and power Doppler as needed of all accessible portions of each vessel. Bilateral testing is considered an integral part of Ahriana Gunkel complete examination. Limited examinations for reoccurring indications may be performed as noted.  Duplex Findings: +----------------------+--------+--------+----------+-------------------+ Mesenteric            PSV cm/sEDV cm/s  Plaque       Comments        +----------------------+--------+--------+----------+-------------------+ Aorta Distal            135                                         +----------------------+--------+--------+----------+-------------------+  Celiac Artery Origin    458           hypoechoic                    +----------------------+--------+--------+----------+-------------------+ Celiac Artery Proximal  299                                         +----------------------+--------+--------+----------+-------------------+ SMA Origin              295                     not well visualized +----------------------+--------+--------+----------+-------------------+ SMA Proximal            356           hypoechoic                    +----------------------+--------+--------+----------+-------------------+ SMA Mid                 205                                         +----------------------+--------+--------+----------+-------------------+ SMA Distal              216                                         +----------------------+--------+--------+----------+-------------------+ IMA                     208                                         +----------------------+--------+--------+----------+-------------------+  Mesenteric Technologist observations: Elevated velocities suggest low end of range ( 70-99%) in the SMA.  Summary: Mesenteric: 70 to 99% stenosis in the superior mesenteric artery and celiac artery. .  *See table(s) above for measurements and observations.  Diagnosing physician: Carolynn Sayers  Electronically signed by Carolynn Sayers on 09/15/2023 at 6:53:48 PM.    Final    IR ANGIO VERTEBRAL SEL VERTEBRAL BILAT MOD SED  Result Date: 09/13/2023 CLINICAL DATA:  History of nausea, vomiting and mild right sided numbness leg > arm, associated with mental status changes. CT angiogram and MRA revealing questionable progression of distal basilar artery proximal right PCA stenosis. EXAM:  BILATERAL COMMON CAROTID AND INNOMINATE ANGIOGRAPHY COMPARISON:  Diagnostic catheter arteriogram of January 03, 2012; MRA MRI of the brain of September 09, 2023. MEDICATIONS: Heparin 1000 units IV. No antibiotic was administered within 1 hour of the procedure. ANESTHESIA/SEDATION: Versed 2 mg mg IV; Fentanyl 50 mcg IV Moderate Sedation Time:  33 minutes The patient was continuously monitored during the procedure by the interventional radiology nurse under my direct supervision. CONTRAST:  Omnipaque 300 approximately 65 mL. FLUOROSCOPY TIME:  Fluoroscopy Time: 9 minutes 48 seconds (421 mGy). COMPLICATIONS: None immediate. TECHNIQUE: Informed written consent was obtained from the patient after Rosene Pilling thorough discussion of the procedural risks, benefits and alternatives. All questions were addressed. Maximal Sterile Barrier Technique was utilized including caps, mask, sterile gowns, sterile gloves, sterile drape, hand hygiene and skin antiseptic. Ameriah Lint timeout  was performed prior to the initiation of the procedure. The right groin was prepped and draped in the usual sterile fashion. Thereafter using modified Seldinger technique, transfemoral access into the right common femoral artery was obtained without difficulty. Over an 0.035 inch guidewire, Laveda Demedeiros 5 French Pinnacle sheath was inserted. Through this, and also over an 0.035 inch guidewire, Monte Bronder 5 [place] JB 1 catheter was advanced to the aortic arch region and selectively positioned in the right vertebral artery, the right common carotid artery, the left common carotid artery and the left subclavian artery. FINDINGS: The innominate arteriogram demonstrates the proximal right subclavian artery and the right common carotid artery to be widely patent. The dominant right vertebral artery origin is widely patent. The vessel opacifies to the cranial skull base. Patency is seen of the right vertebrobasilar junction and the right posterior-inferior cerebellar artery. The proximal 2/3 of  the basilar artery is widely patent. Patency is also seen of the anterior-inferior cerebellar arteries bilaterally, with suggestion of Yianna Tersigni right anterior-inferior cerebellar artery posterior-inferior cerebellar artery complex, Aneshia Jacquet developmental variation. More distally, there is severe stenosis of the distal basilar artery at the level of the superior cerebellar arteries, extending distally into the right posterior cerebral artery P1 segment. Severe stenosis is also evident of the left superior cerebellar arteries at their origins. The right common carotid arteriogram demonstrates mild stenosis of the proximal right external carotid artery. Its branches opacify widely. The right internal carotid artery at the bulb has approximately 50% stenosis by the NASCET criteria with no evidence of intraluminal filling defects or ulcerations. More distally, the vessel is seen to opacify to the cranial skull base. There is moderate stenosis of the petrous cavernous junction. More distally, the distal cavernous and the supraclinoid right ICA are widely patent. The right middle cerebral artery demonstrates Quinta Eimer 50% stenosis at its distal M1 segment. The MCA bifurcation branches are patent with scattered areas of mild atherosclerotic changes. The right anterior cerebral artery A1 A2 segments is widely patent with transient opacification via the anterior communicating artery of the left anterior cerebral artery A2 segment and distally. Kyliegh Jester small focal outpouching is seen projecting inferiorly and slightly laterally from the anterior communicating artery, which may represent Latori Beggs small vascular loop versus Brynlea Spindler 1 mm aneurysm. The left common carotid arteriogram demonstrates Iyonnah Ferrante mild stenosis at the origin of the left external carotid artery. Its branches opacify widely. The left internal carotid artery at the bulb and proximal 1/3 again demonstrates Avin Upperman widely patent stented segment with suggestion of minimal neointimal hyperplasia. More distally, the  left internal carotid artery opacifies to the cranial skull base. The petrous, the cavernous and the supraclinoid ICA are widely patent. The left posterior communicating artery is seen opacifying the left posterior cerebral artery distribution with Donica Derouin mild-to-moderate stenosis. The left middle cerebral artery and the hypoplastic left anterior cerebral artery opacify into the capillary and venous phases. Also demonstrated is delayed opacification of the right posterior cerebral artery P2 segment and distally from the left posterior communicating artery. The left subclavian arteriogram demonstrates mild narrowing at the origin of the hypoplastic left vertebral artery. The vessel, otherwise, is seen to opacify to the cranial skull base where it terminates primarily in the ipsilateral left posterior-inferior cerebellar artery. 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 Associated significant intracranial arteriosclerotic related stenosis at the origins of the superior cerebellar arteries. Suggestion of retrograde opacification of  the right posterior cerebral artery P2 segments and distally from the left posterior communicating artery. Approximately 50% stenosis of the proximal right internal carotid artery secondary to atherosclerotic disease. PLAN: Findings reviewed with patient and the patient's referring neurologist. Option of endovascular revascularization of severe high-grade probably symptomatic stenosis of the distal basilar artery to be discussed with the patient. Electronically Signed   By: Julieanne Cotton M.D.   On: 09/13/2023 08:16   IR ANGIO INTRA EXTRACRAN SEL COM CAROTID INNOMINATE BILAT MOD SED  Result Date: 09/13/2023 CLINICAL DATA:  History of nausea, vomiting and mild right sided numbness leg > arm, associated with mental status changes. CT  angiogram and MRA revealing questionable progression of distal basilar artery proximal right PCA stenosis. EXAM: BILATERAL COMMON CAROTID AND INNOMINATE ANGIOGRAPHY COMPARISON:  Diagnostic catheter arteriogram of January 03, 2012; MRA MRI of the brain of September 09, 2023. MEDICATIONS: Heparin 1000 units IV. No antibiotic was administered within 1 hour of the procedure. ANESTHESIA/SEDATION: Versed 2 mg mg IV; Fentanyl 50 mcg IV Moderate Sedation Time:  33 minutes The patient was continuously monitored during the procedure by the interventional radiology nurse under my direct supervision. CONTRAST:  Omnipaque 300 approximately 65 mL. FLUOROSCOPY TIME:  Fluoroscopy Time: 9 minutes 48 seconds (421 mGy). COMPLICATIONS: None immediate. TECHNIQUE: Informed written consent was obtained from the patient after Jaquavion Mccannon thorough discussion of the procedural risks, benefits and alternatives. All questions were addressed. Maximal Sterile Barrier Technique was utilized including caps, mask, sterile gowns, sterile gloves, sterile drape, hand hygiene and skin antiseptic. Sircharles Holzheimer timeout was performed prior to the initiation of the procedure. The right groin was prepped and draped in the usual sterile fashion. Thereafter using modified Seldinger technique, transfemoral access into the right common femoral artery was obtained without difficulty. Over an 0.035 inch guidewire, Romanda Turrubiates 5 French Pinnacle sheath was inserted. Through this, and also over an 0.035 inch guidewire, Osten Janek 5 [place] JB 1 catheter was advanced to the aortic arch region and selectively positioned in the right vertebral artery, the right common carotid artery, the left common carotid artery and the left subclavian artery. FINDINGS: The innominate arteriogram demonstrates the proximal right subclavian artery and the right common carotid artery to be widely patent. The dominant right vertebral artery origin is widely patent. The vessel opacifies to the cranial skull base. Patency is seen  of the right vertebrobasilar junction and the right posterior-inferior cerebellar artery. The proximal 2/3 of the basilar artery is widely patent. Patency is also seen of the anterior-inferior cerebellar arteries bilaterally, with suggestion of Jaegar Croft right anterior-inferior cerebellar artery posterior-inferior cerebellar artery complex, Shaquoia Miers developmental variation. More distally, there is severe stenosis of the distal basilar artery at the level of the superior cerebellar arteries, extending distally into the right posterior cerebral artery P1 segment. Severe stenosis is also evident of the left superior cerebellar arteries at their origins. The right common carotid arteriogram demonstrates mild stenosis of the proximal right external carotid artery. Its branches opacify widely. The right internal carotid artery at the bulb has approximately 50% stenosis by the NASCET criteria with no evidence of intraluminal filling defects or ulcerations. More distally, the vessel is seen to opacify to the cranial skull base. There is moderate stenosis of the petrous cavernous junction. More distally, the distal cavernous and the supraclinoid right ICA are widely patent. The right middle cerebral artery demonstrates Quincy Prisco 50% stenosis at its distal M1 segment. The MCA bifurcation branches are patent with scattered areas of mild atherosclerotic changes. The right  anterior cerebral artery A1 A2 segments is widely patent with transient opacification via the anterior communicating artery of the left anterior cerebral artery A2 segment and distally. Bluford Sedler small focal outpouching is seen projecting inferiorly and slightly laterally from the anterior communicating artery, which may represent Sharnae Winfree small vascular loop versus Rajiv Parlato 1 mm aneurysm. The left common carotid arteriogram demonstrates Stillman Buenger mild stenosis at the origin of the left external carotid artery. Its branches opacify widely. The left internal carotid artery at the bulb and proximal 1/3 again  demonstrates Ramir Malerba widely patent stented segment with suggestion of minimal neointimal hyperplasia. More distally, the left internal carotid artery opacifies to the cranial skull base. The petrous, the cavernous and the supraclinoid ICA are widely patent. The left posterior communicating artery is seen opacifying the left posterior cerebral artery distribution with Marlina Cataldi mild-to-moderate stenosis. The left middle cerebral artery and the hypoplastic left anterior cerebral artery opacify into the capillary and venous phases. Also demonstrated is delayed opacification of the right posterior cerebral artery P2 segment and distally from the left posterior communicating artery. The left subclavian arteriogram demonstrates mild narrowing at the origin of the hypoplastic left vertebral artery. The vessel, otherwise, is seen to opacify to the cranial skull base where it terminates primarily in the ipsilateral left posterior-inferior cerebellar artery. 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 Associated significant intracranial arteriosclerotic related stenosis at the origins of the superior cerebellar arteries. Suggestion of retrograde opacification of the right posterior cerebral artery P2 segments and distally from the left posterior communicating artery. Approximately 50% stenosis of the proximal right internal carotid artery secondary to atherosclerotic disease. PLAN: Findings reviewed with patient and the patient's referring neurologist. Option of endovascular revascularization of severe high-grade probably symptomatic stenosis of the distal basilar artery to be discussed with the patient. Electronically Signed   By: Julieanne Cotton M.D.   On: 09/13/2023 08:16   ECHOCARDIOGRAM COMPLETE BUBBLE STUDY  Result Date: 09/10/2023    ECHOCARDIOGRAM REPORT   Patient Name:    [name] Date of Exam: 09/10/2023 Medical Rec #:  [id number]       Height:       62.0 in Accession #:    [id number]      Weight:       145.0 lb Date of Birth:  1946/08/21       BSA:          1.667 m Patient Age:    77 years        BP:           114/49 mmHg Patient Gender: F               HR:           58 bpm. Exam Location:  Inpatient Procedure: 2D Echo, Color Doppler, Cardiac Doppler and Saline Contrast Bubble            Study Indications:    Stroke  History:        Patient has prior history of Echocardiogram examinations, most                 recent 11/11/2011. Stroke, TIA and PAD; Risk                 Factors:Hypertension, Diabetes and Dyslipidemia.  Sonographer:    Milbert Coulter Referring Phys: [id number] CORTNEY E DE LA TORRE IMPRESSIONS  1.  Left ventricular ejection fraction, by estimation, is 60 to 65%. The left ventricle has normal function. The left ventricle has no regional wall motion abnormalities. There is mild left ventricular hypertrophy. Left ventricular diastolic parameters are consistent with Grade I diastolic dysfunction (impaired relaxation).  2. Right ventricular systolic function is normal. The right ventricular size is normal. Tricuspid regurgitation signal is inadequate for assessing PA pressure.  3. Resolution is challenging for bubble study; possible small PFO with Precilla Purnell few bubble within 3-5 cardiac cycles.  4. The mitral valve is normal in structure. No evidence of mitral valve regurgitation.  5. The aortic valve is normal in structure. Aortic valve regurgitation is not visualized.  6. The inferior vena cava is normal in size with greater than 50% respiratory variability, suggesting right atrial pressure of 3 mmHg. Conclusion(s)/Recommendation(s): Possible small PFO; considering his age and comorbidites stroke etiology is multifactorial. Would not w/u for PFO closure. FINDINGS  Left Ventricle: Left ventricular ejection fraction, by estimation, is 60 to 65%. The left ventricle has normal  function. The left ventricle has no regional wall motion abnormalities. The left ventricular internal cavity size was normal in size. There is  mild left ventricular hypertrophy. Left ventricular diastolic parameters are consistent with Grade I diastolic dysfunction (impaired relaxation). Right Ventricle: The right ventricular size is normal. Right ventricular systolic function is normal. Tricuspid regurgitation signal is inadequate for assessing PA pressure. Left Atrium: Left atrial size was normal in size. Right Atrium: Right atrial size was normal in size. Pericardium: There is no evidence of pericardial effusion. Mitral Valve: The mitral valve is normal in structure. No evidence of mitral valve regurgitation. Tricuspid Valve: Tricuspid valve regurgitation is not demonstrated. Aortic Valve: The aortic valve is normal in structure. Aortic valve regurgitation is not visualized. Aortic valve mean gradient measures 4.0 mmHg. Aortic valve peak gradient measures 7.6 mmHg. Aortic valve area, by VTI measures 2.14 cm. Pulmonic Valve: Pulmonic valve regurgitation is not visualized. Aorta: The aortic root is normal in size and structure. Venous: The inferior vena cava is normal in size with greater than 50% respiratory variability, suggesting right atrial pressure of 3 mmHg. IAS/Shunts: No atrial level shunt detected by color flow Doppler. Agitated saline contrast was given intravenously to evaluate for intracardiac shunting.  LEFT VENTRICLE PLAX 2D LVIDd:         4.20 cm   Diastology LVIDs:         2.90 cm   LV e' medial:    7.51 cm/s LV PW:         1.00 cm   LV E/e' medial:  6.8 LV IVS:        1.10 cm   LV e' lateral:   11.40 cm/s LVOT diam:     2.10 cm   LV E/e' lateral: 4.5 LV SV:         70 LV SV Index:   42 LVOT Area:     3.46 cm  RIGHT VENTRICLE RV Basal diam:  2.80 cm RV Mid diam:    2.40 cm RV S prime:     20.80 cm/s TAPSE (M-mode): 2.5 cm LEFT ATRIUM             Index        RIGHT ATRIUM           Index LA diam:         3.90 cm 2.34 cm/m   RA Area:     10.60 cm LA Vol (A2C):  35.8 ml 21.47 ml/m  RA Volume:   20.40 ml  12.23 ml/m LA Vol (A4C):   40.0 ml 23.99 ml/m LA Biplane Vol: 39.7 ml 23.81 ml/m  AORTIC VALVE AV Area (Vmax):    2.25 cm AV Area (Vmean):   2.26 cm AV Area (VTI):     2.14 cm AV Vmax:           138.00 cm/s AV Vmean:          91.400 cm/s AV VTI:            0.325 m AV Peak Grad:      7.6 mmHg AV Mean Grad:      4.0 mmHg LVOT Vmax:         89.60 cm/s LVOT Vmean:        59.700 cm/s LVOT VTI:          0.201 m LVOT/AV VTI ratio: 0.62  AORTA Ao Root diam: 3.20 cm MITRAL VALVE MV Area (PHT): 2.12 cm    SHUNTS MV Decel Time: 357 msec    Systemic VTI:  0.20 m MV E velocity: 50.80 cm/s  Systemic Diam: 2.10 cm MV Malavika Lira velocity: 84.40 cm/s MV E/Breniya Goertzen ratio:  0.60 Photographer signed by Carolan Clines Signature Date/Time: 09/10/2023/3:36:31 PM    Final    CT ABDOMEN PELVIS W CONTRAST  Result Date: 09/09/2023 CLINICAL DATA:  Abdomen pain EXAM: CT ABDOMEN AND PELVIS WITH CONTRAST TECHNIQUE: Multidetector CT imaging of the abdomen and pelvis was performed using the standard protocol following bolus administration of intravenous contrast. RADIATION DOSE REDUCTION: This exam was performed according to the departmental dose-optimization program which includes automated exposure control, adjustment of the mA and/or kV according to patient size and/or use of iterative reconstruction technique. CONTRAST:  75mL OMNIPAQUE IOHEXOL 350 MG/ML SOLN COMPARISON:  CT 09/09/2023, CT  angiography 08/08/2023, 08/07/2023 FINDINGS: Lower chest: Lung bases demonstrate no acute airspace disease. Hepatobiliary: Excreted contrast in the gallbladder. No biliary dilatation or focal hepatic abnormality Pancreas: Unremarkable. No pancreatic ductal dilatation or surrounding inflammatory changes. Spleen: Normal in size without focal abnormality. Adrenals/Urinary Tract: Adrenal glands are within normal limits. Cyst in the right kidney, no  imaging follow-up is recommended. Excreted contrast within the renal collecting systems and bladder. Interim development of diffuse bladder wall thickening, perivesical stranding and gas within the wall of the bladder. Stomach/Bowel: The stomach is nonenlarged. No dilated small bowel. Suspicion of mild wall thickening and inflammation at the ileocecal junction and proximal ascending colon, series 3 image 47 through 55. Diverticular disease of the left colon. Vascular/Lymphatic: Moderate aortic atherosclerosis. No aneurysm. No suspicious lymph nodes. Reproductive: Uterus unremarkable. Hyperdense material/contrast within the vagina. No adnexal mass Other: No free air or pelvic effusion. Small foci of gas along the bilateral pelvic sidewall, suspect that these are intravascular. Musculoskeletal: No acute or suspicious osseous abnormality. IMPRESSION: 1. Interim development of diffuse bladder wall thickening, perivesical stranding and gas within the wall of the bladder, consistent with emphysematous cystitis. 2. Suspicion of mild wall thickening and inflammation at the ileocecal junction and proximal ascending colon which may be due to distal ileitis/colitis from infection, inflammatory bowel disease, or ischemia. 3. Diverticular disease of the left colon without acute inflammatory change. 4. Hyperdense contrast within the vagina, no obvious fistula seen. Question related to vaginal reflux. Aortic Atherosclerosis (ICD10-I70.0) and Emphysema (ICD10-J43.9). These results will be called to the ordering clinician or representative by the Radiologist Assistant, and communication documented in the PACS or [employer].  Electronically Signed   By: Jasmine Pang M.D.   On: 09/09/2023 23:36   CT ABDOMEN PELVIS WO CONTRAST  Result Date: 09/09/2023 CLINICAL DATA:  Bowel obstruction suspected. EXAM: CT ABDOMEN AND PELVIS WITHOUT CONTRAST TECHNIQUE: Multidetector CT imaging of the abdomen and pelvis was performed following  the standard protocol without IV contrast. RADIATION DOSE REDUCTION: This exam was performed according to the departmental dose-optimization program which includes automated exposure control, adjustment of the mA and/or kV according to patient size and/or use of iterative reconstruction technique. COMPARISON:  CTA abdomen and pelvis 08/08/2023 FINDINGS: Lower chest: Mild atelectasis in the lung bases. Coronary atherosclerosis. Normal heart size. No pleural effusion. Hepatobiliary: No focal liver abnormality is identified on this unenhanced study. Gallbladder wall thickening is likely secondary to nondistention, without acute pericholecystic inflammation. No biliary dilatation. Pancreas: Unremarkable. Spleen: Unremarkable. Adrenals/Urinary Tract: Unremarkable adrenal glands. Excreted IV contrast material from today's earlier head and neck CTA in the renal collecting systems, ureters, and bladder. No hydronephrosis. 1.5 cm low-density lesion in the right kidney compatible with Madinah Quarry cyst with no follow-up imaging recommended. Small amount of nondependent gas in the bladder, query recent catheterization. Stomach/Bowel: The stomach is unremarkable. Mild left-sided colonic diverticulosis is noted. There is no evidence of bowel obstruction or inflammation. Vascular/Lymphatic: Abdominal aortic atherosclerosis without aneurysm. No enlarged lymph nodes. Reproductive: Uterus and bilateral adnexa are unremarkable. Other: No ascites or pneumoperitoneum. Small fat-containing umbilical hernia. Musculoskeletal: No suspicious osseous lesion. Widespread thoracolumbar facet arthrosis. Moderate disc degeneration about the thoracolumbar junction. IMPRESSION: 1. No acute abnormality identified in the abdomen or pelvis. 2.  Aortic Atherosclerosis (ICD10-I70.0). Electronically Signed   By: Sebastian Ache M.D.   On: 09/09/2023 08:45   MR ANGIO HEAD WO CONTRAST  Result Date: 09/09/2023 CLINICAL DATA:  Code stroke. 77 year old female last  known well 2100 hours. Abnormal speech and right side deficits. Distal basilar occlusion or near occlusion by CTA. EXAM: MRA HEAD WITHOUT CONTRAST TECHNIQUE: Angiographic images of the Circle of Willis were acquired using MRA technique without intravenous contrast. COMPARISON:  CTA head and neck, brain MRI this morning. Previous intracranial MRA 12/10/2011. FINDINGS: Intermittently motion degraded exam. Anterior circulation: Antegrade flow maintained in the distal cervical ICAs and both ICA siphons. Patent carotid termini, MCA and ACA origins. Motion artifact again degrades detail of the MCA and ACA branches, and multifocal irregularity of those vessels could be artifact. But no anterior circulation large vessel occlusion is evident. Posterior circulation: Dominant right vertebral artery again noted which supplies the basilar. Distal right vertebral, right PICA and proximal basilar arteries are patent. Mid basilar flow signal maintained although more irregular compared to the 2012 MRA. And there is absent flow signal at the basilar tip and right PCA origin concordant with the CTA peer in Stu they (series 17, image 96. Flow signal there was maintained in 2012. Likewise, SCA origins are not identified now, were visible in 2012. Right PCA flow is reconstituted as by CTA. And there is Jojo Pehl fetal type left PCA origin again noted. Anatomic variants: Dominant right vertebral artery supplies the basilar. Fetal type left PCA origin. Other: Brain MRI today is reported separately. IMPRESSION: 1. Confirmed Poor flow/Occlusion of the Basilar Artery tip and Right PCA origin, concordant with CTA today. This is new since 2012, and SCA origins also no longer visible by MRA or CTA. See MRI at the same time reported separately. 2. Motion degraded exam. Other evidence of intracranial atherosclerosis, but no other large vessel occlusion identified. Electronically Signed  By: Odessa Fleming M.D.   On: 09/09/2023 05:52   MR BRAIN WO  CONTRAST  Result Date: 09/09/2023 CLINICAL DATA:  Code stroke. 77 year old female last known well 2100 hours. Abnormal speech and right side deficits. Distal basilar occlusion or near occlusion by CTA. EXAM: MRI HEAD WITHOUT CONTRAST TECHNIQUE: Multiplanar, multiecho pulse sequences of the brain and surrounding structures were obtained without intravenous contrast. COMPARISON:  CTA head and neck today.  Brain MRI 08/16/2022. FINDINGS: Brain: No restricted diffusion or evidence of acute infarction. Stable cerebral volume. No midline shift, mass effect, evidence of mass lesion, ventriculomegaly, extra-axial collection or acute intracranial hemorrhage. Cervicomedullary junction and pituitary are within normal limits. Patchy bilateral cerebral white matter T2 and FLAIR hyperintensity which is most pronounced in the periatrial regions appears stable from last year. Motion artifact in the posterior fossa on FLAIR imaging today. But T1 and T2 signal in the brainstem and cerebellum appears stable from last year, within normal limits for age. No chronic cerebral blood products on SWI. Vascular: Major intracranial vascular flow voids do appear stable compared to the 2023 MRI, including diminutive and irregular appearance of the distal basilar artery on series 13, image 10. Skull and upper cervical spine: Stable, negative. Sinuses/Orbits: Chronic paranasal sinus disease. Stable postoperative appearance of the orbits. Other: Mastoids remain well aerated. Visible internal auditory structures appear normal. Negative visible scalp and face. IMPRESSION: 1. No evidence of acute ischemia and essentially stable noncontrast MRI appearance of the brain since last year. This was discussed by telephone with Dr. Erick Blinks on 09/09/2023 at 0515 hours. 2. MRA is reported separately. Electronically Signed   By: Odessa Fleming M.D.   On: 09/09/2023 05:42   CT ANGIO HEAD NECK W WO CM (CODE STROKE)  Result Date: 09/09/2023 CLINICAL DATA:   Code stroke. 77 year old female last known well 2100 hours. Abnormal speech and right side deficits. EXAM: CT ANGIOGRAPHY HEAD AND NECK WITH AND WITHOUT CONTRAST TECHNIQUE: Multidetector CT imaging of the head and neck was performed using the standard protocol during bolus administration of intravenous contrast. Multiplanar CT image reconstructions and MIPs were obtained to evaluate the vascular anatomy. Carotid stenosis measurements (when applicable) are obtained utilizing NASCET criteria, using the distal internal carotid diameter as the denominator. RADIATION DOSE REDUCTION: This exam was performed according to the departmental dose-optimization program which includes automated exposure control, adjustment of the mA and/or kV according to patient size and/or use of iterative reconstruction technique. CONTRAST:  75mL OMNIPAQUE IOHEXOL 350 MG/ML SOLN COMPARISON:  Plain head CT 0436 hours today. Prior CTA neck 07/01/2020. Previous brain MRA 12/10/2011. And brain MRI last year 08/16/2022. FINDINGS: CTA NECK Skeleton: Chronic paranasal sinus disease. Chronic cervical spine degeneration. Facet arthropathy and C4-C5 facet ankylosis on the right side. Bilateral cervicothoracic facet ankylosis. No acute osseous abnormality identified. Upper chest: Negative. Other neck: No acute finding. Aortic arch: Calcified aortic atherosclerosis.  3 vessel arch. Right carotid system: No significant brachiocephalic artery or right CCA origin plaque or stenosis. Tortuous proximal right CCA. Bulky soft and calcified plaque along the medial vessel proximal to the bifurcation. Up to 50 % stenosis with respect to the distal vessel (series 7, image 113). Bulky calcified plaque at the right ICA origin and bulb with high-grade stenosis numerically estimated at 69 % with respect to the distal vessel (series 5, image 201). The vessel remains patent to the skull base. This does not appear significantly changed since 2021. Left carotid system: Left  CCA origin plaque without stenosis. Patent  stent beginning in the distal left CCA and continuing through the ICA, with substantially improved flow there compared to the 2021 CTA. No significant stenosis to the skull base. Vertebral arteries: Proximal right subclavian artery calcified plaque without stenosis. Normal right vertebral artery origin. Dominant appearing right vertebral artery is patent and mildly tortuous to the skull base without stenosis. Proximal left subclavian artery plaque and also motion artifact. No high-grade proximal subclavian artery stenosis. Left vertebral artery is non dominant and highly diminutive. There is chronic calcified plaque at its origin. Diminutive left vertebral artery appears stable since 2021, remains patent although diminutive to the skull base. CTA HEAD Posterior circulation: Dominant right vertebral V4 segment with normal PICA origins supplies the proximal basilar. Diminutive left vertebral artery functionally terminates in PICA as before. Proximal basilar artery is patent, but there is distal basilar stenosis or thrombosis along Melissa Pulido segment of 3-4 mm as seen on series 10, image 26. This level was not included on the 2021 CTA, but was patent on the 2012 MR Kaysa Roulhac. there is reconstitution of the right PCA origin, with diminutive or absent right posterior communicating artery on that side. There is Larkin Alfred contralateral fetal type left PCA origin. Bilateral PCA branches remain patent with mild irregularity. SCA origins are not identified. Anterior circulation: Both ICA siphons are patent. Both ICA siphons are calcified. On the left there is mild to moderate siphon stenosis maximal in the supraclinoid segment. On the right there is also mild to moderate supraclinoid stenosis. Normal left posterior communicating artery origin. Patent carotid termini. Dominant appearing right ACA A1, similar to 2012 MRA. Normal anterior communicating artery. Patent MCA origins. Bilateral MCA and ACA branch  detail is motion degraded. Both MCA bifurcations appear to remain patent. But MCA and ACA branch detail is otherwise limited. Delayed phase images were obtained (series 14), and seem to demonstrate fairly symmetric A2 and M2 enhancement. Venous sinuses: Patent on the delayed images. Anatomic variants: Chronically dominant right and diminutive left vertebral arteries. Left vertebral functionally terminates in PICA. Fetal type left PCA origin. Dominant right ACA A1. Review of the MIP images confirms the above findings Preliminary report of this exam was discussed by telephone with Dr. Tollie Eth on 09/09/2023 at 04:50 . IMPRESSION: 1. 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 Nonvisualization of the distal 3-4 mm of the Basilar, new since 2012. Involvement of the right PCA origin, but right PCA is reconstituted. SCAs are not identified. Stat Brain MRI being obtained for further evaluation, see that report. This was discussed by telephone with Dr. Tollie Eth on 09/09/2023 at 04:50. 2. Motion degraded MCA and ACA branches with no other strong evidence of ELVO. 3. Substantially improved cervical Left ICA following stenting. High-grade contralateral proximal right ICA stenosis estimated at 69%, not significantly changed from 2021. Calcified ICA siphons with mild to moderate bilateral siphon stenosis. 4. Aortic Atherosclerosis (ICD10-I70.0). Cervical spine degeneration and facet ankylosis. Electronically Signed   By: Odessa Fleming M.D.   On: 09/09/2023 05:26   DG Chest Port 1 View  Result Date: 09/09/2023 CLINICAL DATA:  Code stroke. 77 year old female last known well 2100 hours. Abnormal speech and right side deficits. EXAM: PORTABLE CHEST 1 VIEW COMPARISON:  CTA neck today.  Portable chest 08/15/2022. FINDINGS: Portable AP semi upright view at 0418 hours. Lower lung volumes compared to last year. Platelike left lung base opacity. But elsewhere Allowing for portable technique the lungs are clear.  Upper lungs appeared clear on CTA today. Normal cardiac size  and mediastinal contours. Visualized tracheal air column is within normal limits. No acute osseous abnormality identified. Paucity of bowel gas. IMPRESSION: Lower lung volumes with basilar atelectasis, especially on the left. Electronically Signed   By: Odessa Fleming M.D.   On: 09/09/2023 04:52   CT Head Code Stroke WO Contrast  Result Date: 09/09/2023 CLINICAL DATA:  Code stroke. 77 year old female last known well 2100 hours. Abnormal speech and right side deficits. EXAM: CT HEAD WITHOUT CONTRAST TECHNIQUE: Contiguous axial images were obtained from the base of the skull through the vertex without intravenous contrast. RADIATION DOSE REDUCTION: This exam was performed according to the departmental dose-optimization program which includes automated exposure control, adjustment of the mA and/or kV according to patient size and/or use of iterative reconstruction technique. COMPARISON:  Brain MRI 08/16/2022.  Head CT 08/15/2022. FINDINGS: Brain: Stable cerebral volume, normal for age. Stable gray-white matter differentiation throughout the brain. No midline shift, ventriculomegaly, mass effect, evidence of mass lesion, intracranial hemorrhage or evidence of cortically based acute infarction. Vascular: Calcified atherosclerosis at the skull base. No suspicious intracranial vascular hyperdensity. Skull: Stable.  No acute osseous abnormality identified. 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 Chronic right ethmoidectomy. Tympanic cavities and mastoids remain clear. Other: No gaze deviation. Visualized scalp soft tissues are within normal limits. ASPECTS [facility] Stroke Program Early CT Score) Total score (0-10 with 10 being normal): 10 IMPRESSION: 1. Stable and negative for age noncontrast CT appearance of the brain. ASPECTS 10. 2. The above communicated to Dr. Derry Lory at 4:44 am on  09/09/2023 by text page via the [facility] messaging system. 3. Chronic paranasal sinus disease. Electronically Signed   By: Odessa Fleming M.D.   On: 09/09/2023 04:45    Microbiology: Recent Results (from the past 240 hour(s))  Gastrointestinal Panel by PCR , Stool     Status: None   Collection Time: 09/13/23  9:39 PM   Specimen: STOOL  Result Value Ref Range Status   Campylobacter species NOT DETECTED NOT DETECTED Final   Plesimonas shigelloides NOT DETECTED NOT DETECTED Final   Salmonella species NOT DETECTED NOT DETECTED Final   Yersinia enterocolitica NOT DETECTED NOT DETECTED Final   Vibrio species NOT DETECTED NOT DETECTED Final   Vibrio cholerae NOT DETECTED NOT DETECTED Final   Enteroaggregative E coli (EAEC) NOT DETECTED NOT DETECTED Final   Enteropathogenic E coli (EPEC) NOT DETECTED NOT DETECTED Final   Enterotoxigenic E coli (ETEC) NOT DETECTED NOT DETECTED Final   Shiga like toxin producing E coli (STEC) NOT DETECTED NOT DETECTED Final   Shigella/Enteroinvasive E coli (EIEC) NOT DETECTED NOT DETECTED Final   Cryptosporidium NOT DETECTED NOT DETECTED Final   Cyclospora cayetanensis NOT DETECTED NOT DETECTED Final   Entamoeba histolytica NOT DETECTED NOT DETECTED Final   Giardia lamblia NOT DETECTED NOT DETECTED Final   Adenovirus F40/41 NOT DETECTED NOT DETECTED Final   Astrovirus NOT DETECTED NOT DETECTED Final   Norovirus GI/GII NOT DETECTED NOT DETECTED Final   Rotavirus Aitana Burry NOT DETECTED NOT DETECTED Final   Sapovirus (I, II, IV, and V) NOT DETECTED NOT DETECTED Final    Comment: Performed at [facility], [address] [place], [place] [address]  C Difficile Quick Screen (NO PCR Reflex)     Status: Abnormal   Collection Time: 09/13/23  9:40 PM   Specimen: STOOL  Result Value Ref Range Status   C Diff antigen POSITIVE (Glynna Failla) NEGATIVE Final   C Diff toxin  NEGATIVE NEGATIVE Final   C Diff interpretation   Final    Results are indeterminate. Please contact the provider  listed for your campus for C diff questions in AMION.    Comment: Performed at [facility], [address] [place], [place] [address]  Culture, blood (Routine X 2) w Reflex to ID Panel     Status: None   Collection Time: 09/13/23  9:56 PM   Specimen: BLOOD  Result Value Ref Range Status   Specimen Description BLOOD BLOOD LEFT ARM  Final   Special Requests   Final    BOTTLES DRAWN AEROBIC AND ANAEROBIC Blood Culture adequate volume   Culture   Final    NO GROWTH 5 DAYS Performed at [facility], [address], [place], [place] [address]    Report Status 09/18/2023 FINAL  Final  Culture, blood (Routine X 2) w Reflex to ID Panel     Status: None   Collection Time: 09/13/23  9:57 PM   Specimen: BLOOD LEFT HAND  Result Value Ref Range Status   Specimen Description BLOOD LEFT HAND  Final   Special Requests   Final    BOTTLES DRAWN AEROBIC AND ANAEROBIC Blood Culture adequate volume   Culture   Final    NO GROWTH 5 DAYS Performed at [facility], [address], [place], [place] [address]    Report Status 09/18/2023 FINAL  Final  C. Diff by PCR     Status: Abnormal   Collection Time: 09/14/23  2:40 PM   Specimen: STOOL  Result Value Ref Range Status   Toxigenic C. Difficile by PCR POSITIVE (Marchel Foote) NEGATIVE Final    Comment: Positive for toxigenic C. difficile with little to no toxin production. Only treat if clinical presentation suggests symptomatic illness. Performed at [facility], [address], [place], [place] [address]   Urine Culture (for pregnant, neutropenic or urologic patients or patients with an indwelling urinary catheter)     Status: None   Collection Time: 09/16/23 11:00 AM   Specimen: Urine, Clean Catch  Result Value Ref Range Status   Specimen Description URINE, CLEAN CATCH  Final   Special Requests NONE  Final   Culture   Final    NO GROWTH Performed at [facility], [address], [place], [place] [address]    Report Status  09/17/2023 FINAL  Final     Labs: Basic Metabolic Panel: Recent Labs  Lab 09/14/23 1456 09/15/23 0745 09/16/23 0703 09/20/23 0431  NA 136 135 137 136  K 3.8 3.1* 4.3 4.1  CL 105 103 108 107  CO2 24 23 21* 21*  GLUCOSE 102* 113* 115* 143*  BUN 6* 5* 5* 13  CREATININE 0.77 0.83 0.58 0.68  CALCIUM 8.6* 8.3* 8.4* 8.2*  MG  --  1.4* 2.1 1.6*  PHOS  --  2.9 2.9 3.4   Liver Function Tests: Recent Labs  Lab 09/14/23 1456 09/15/23 0745 09/16/23 0703 09/20/23 0431  AST 33 26 29 22   ALT 26 26 26 25   ALKPHOS 68 61 62 60  BILITOT 0.6 0.3 0.6 0.6  PROT 5.9* 5.3* 5.4* 5.2*  ALBUMIN 3.0* 2.6* 2.8* 2.8*   No results for input(s): "LIPASE", "AMYLASE" in the last 168 hours. No results for input(s): "AMMONIA" in the last 168 hours. CBC: Recent Labs  Lab 09/14/23 1456 09/15/23 0745 09/16/23 0703  WBC 4.8 4.0 4.2  NEUTROABS 2.6 2.2 2.0  HGB 10.3*  9.8* 9.9*  HCT 33.3* 31.4* 32.2*  MCV 90.2 90.0 89.2  PLT 304 254 251   Cardiac Enzymes: No results for input(s): "CKTOTAL", "CKMB", "CKMBINDEX", "TROPONINI" in the last 168 hours. BNP: BNP (last 3 results) No results for input(s): "BNP" in the last 8760 hours.  ProBNP (last 3 results) No results for input(s): "PROBNP" in the last 8760 hours.  CBG: Recent Labs  Lab 09/19/23 0623 09/19/23 0952 09/19/23 1732 09/19/23 2130 09/20/23 0758  GLUCAP 125* 126* 231* 206* 116*       Signed:  Lacretia Nicks MD.  [facility] 09/20/2023, 11:59 AM

## 2023-09-20 NOTE — Evaluation (Signed)
Occupational Therapy Evaluation Patient Details Name: [name] MRN: [id number] DOB: 1946/04/12 Today's Date: 09/20/2023   History of Present Illness 77 yo female work up for TIA, chronic diarrhea, abnormal colon CT and occlusion of stenosis basilar artery. PT s/p 10/7 R vertebral artery angiogram revascularization PMH HTN HLD diverticulosis DM2 ETOH abuse  remission R ICA s/p stenting 8/25-8/29 for GIB, appendectomy   Clinical Impression   Patient is s/p s/p artery angiogram revascularization surgery resulting in functional limitations due to the deficits listed below (see OT problem list). Pt at this time demonstrates cognitive deficits and balance deficits.  Patient will benefit from skilled OT acutely to increase independence and safety with ADLS to allow discharge HHOT.        If plan is discharge home, recommend the following: A little help with walking and/or transfers;A little help with bathing/dressing/bathroom    Functional Status Assessment  Patient has had a recent decline in their functional status and demonstrates the ability to make significant improvements in function in a reasonable and predictable amount of time.  Equipment Recommendations  None recommended by OT    Recommendations for Other Services       Precautions / Restrictions Precautions Precautions: Fall      Mobility Bed Mobility Overal bed mobility: Modified Independent                  Transfers Overall transfer level: Needs assistance   Transfers: Sit to/from Stand Sit to Stand: Contact guard assist                  Balance Overall balance assessment: Needs assistance Sitting-balance support: Bilateral upper extremity supported, Feet supported Sitting balance-Leahy Scale: Good     Standing balance support: Single extremity supported, During functional activity Standing balance-Leahy Scale: Fair                             ADL either performed or assessed  with clinical judgement   ADL Overall ADL's : Needs assistance/impaired Eating/Feeding: Modified independent   Grooming: Wash/dry face;Supervision/safety               Lower Body Dressing: Contact guard assist Lower Body Dressing Details (indicate cue type and reason): pt required sitting on commode for peri care due to balance Toilet Transfer: Contact guard assist;Ambulation;Regular Toilet;Grab bars   Toileting- Clothing Manipulation and Hygiene: Contact guard assist;Sitting/lateral lean       Functional mobility during ADLs: Contact guard assist       Vision Baseline Vision/History: 1 Wears glasses Ability to See in Adequate Light: 1 Impaired Patient Visual Report: No change from baseline       Perception         Praxis         Pertinent Vitals/Pain Pain Assessment Pain Assessment: No/denies pain     Extremity/Trunk Assessment Upper Extremity Assessment Upper Extremity Assessment: Right hand dominant   Lower Extremity Assessment Lower Extremity Assessment: Overall WFL for tasks assessed   Cervical / Trunk Assessment Cervical / Trunk Assessment: Other exceptions Cervical / Trunk Exceptions: reports outpatient pt for cervical neck deficits. pt educated on proper alignement for reading book and keep head in neutral. pt states "i do it differently at home"   Communication Communication Communication: Hearing impairment (hearing aides)   Cognition Arousal: Alert Behavior During Therapy: WFL for tasks assessed/performed Overall Cognitive Status: Impaired/Different from baseline Area of Impairment: Safety/judgement, Awareness, Problem solving  Safety/Judgement: Decreased awareness of deficits, Decreased awareness of safety Awareness: Emergent Problem Solving: Slow processing General Comments: pt impulsive and needing cues throughout session for safety. pt cued to remain sitting and then stands. pt cued again and asked what  the request was to ensure the pt heard the cue. pt again standing and seems surprised to be cued to sit again     General Comments  pt with DOE with peri care. pt noted to have cognitive deficits related to safety. provided education on energy conservation and good ergonomics with positioning and met with some resistance to receptive learning    Exercises     Shoulder Instructions      Home Living Family/patient expects to be discharged to:: Private residence Living Arrangements: Spouse/significant other Available Help at Discharge: Family;Available 24 hours/day Type of Home: House Home Access: Stairs to enter [employer] of Steps: 3-4 Entrance Stairs-Rails: Right Home Layout: Two level;Full bath on main level;Able to live on main level with bedroom/bathroom     Bathroom Shower/Tub: Estate manager/land agent Accessibility: Yes          Lives With: Spouse    Prior Functioning/Environment Prior Level of Function : Independent/Modified Independent             Mobility Comments: ind ADLs Comments: ind        OT Problem List: Decreased activity tolerance;Impaired balance (sitting and/or standing);Decreased cognition;Decreased knowledge of use of DME or AE;Decreased safety awareness;Decreased knowledge of precautions      OT Treatment/Interventions: Self-care/ADL training;DME and/or AE instruction    OT Goals(Current goals can be found in the care plan section) Acute Rehab OT Goals Patient Stated Goal: to go home today OT Goal Formulation: With patient Time For Goal Achievement: 10/04/23 Potential to Achieve Goals: Good  OT Frequency: Min 1X/week    Co-evaluation              AM-PAC OT "6 Clicks" Daily Activity     Outcome Measure Help from another person eating meals?: None Help from another person taking care of personal grooming?: None Help from another person toileting, which includes using toliet, bedpan, or urinal?: A Little Help from  another person bathing (including washing, rinsing, drying)?: A Little Help from another person to put on and taking off regular upper body clothing?: None Help from another person to put on and taking off regular lower body clothing?: A Little 6 Click Score: 21   End of Session Equipment Utilized During Treatment: Gait belt Nurse Communication: Mobility status;Precautions  Activity Tolerance: Patient tolerated treatment well Patient left: in bed;with call bell/phone within reach;with family/visitor present  OT Visit Diagnosis: Unsteadiness on feet (R26.81);Muscle weakness (generalized) (M62.81)                Time: 1610-9604 OT Time Calculation (min): 21 min Charges:  OT General Charges $OT Visit: 1 Visit OT Evaluation $OT Eval Moderate Complexity: 1 Mod   Brynn, OTR/L  Acute Rehabilitation Services Office: [phone] .   Mateo Flow 09/20/2023, 1:30 PM

## 2023-09-20 NOTE — Progress Notes (Signed)
PROGRESS NOTE    [name]  XBJ:[id number] DOB: October 23, 1946 DOA: 09/09/2023 PCP: Marguarite Arbour, MD  Chief Complaint  Patient presents with   Emesis    Brief Narrative:   77 y.o. female with medical history significant of HTN, HLD, diverticulosis, DM2, ETOH abuse in remission, right ICA stenosis status post stenting, interim history of admission 8/25/-8/29 for lower gi bleed presumed diverticular with associated hemorrhagic shock which required icu admission while on DAPT.   Patient course further complicated by appendectomy 2 weeks ago. Patient presents  who further interim history of feeling poorly over the last few days with n/v/d and abdominal  pain. However on waking this am around 2 am with  [name] bout of n/v she noted weakness/numbness in right leg. Due to this EMS was called.   Assessment & Plan:   Principal Problem:   TIA (transient ischemic attack) Active Problems:   Chronic diarrhea   Abnormal CT scan, colon   Occlusion and stenosis of basilar artery  TIA - suspected due to basilar artery stenosis in setting of dehydration with vomiting/diarrhea - CTA head/neck with distal basilar artery occlusion or near occlusion, nonvisualization of distal 3-4 mm of basilar (new since 2012), involvement of R PCA origin, but right PCA is reconstituted.  SCA's not identified. Improved cervical L ICA following stenting.  High grade contralateral proximal R ICA stenosis. - MRI brain without acute ischemia - MRA with poor flow/occlusion of basilar artery tip and R PCA origin  - was on aspirin/plavix prior to admission -> ASA/brilinta in preparation of stenting - will repeat urine culture 10/4 - no growth - crestor  - A1c 5.7, LDL 117 - now is s/p R vertebral artery angiogram, with interval complete revascularization of previously occluded distal basilar artery, the posterior cerebral artery and the superior cerebellar arteries, 10/7.  to neuro ICU post op per neuro interventional.   Discharge per neuro interventional recs, will discuss antiplatelets at discharge.        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 -blood cultures NGTDx2  -transition to augmentin, 10 days augmentin   Mild thickening and inflammation of ileocecal junction and proximal ascending colon Distal ileitis/colitis (infectious vs inflammatory vs ischemic?) C diff antigen positive, negative toxin -> PCR positive, treating with vancomycin -> continue 7 days post augmentin Negative GI path panel  Appreciate surgery assistance with recent appendectomy 2 weeks ago -> recommending vascular surgery c/s with regards to question of stenoses of mesenteric vessels potentially contributing to presentation  Appreciate GI assistance -> r/o c diff, no endoscopy/colonoscopy currently recommended -> needs outpatient Vascular has been consulted (as below)  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%  Appreciate vascular recommendations -> follow outpatient    Hypokalemia -Replete  Diabetes mellitus type 2 -Continue sliding scale insulin with NovoLog -at home on trulicity, jardiance, metformin    Recent history of GI bleed -trend Hb/Hct -> recently stable   Hypertension -BP goal 130-160 before basilar artery revascularization -Continue nebivolol, -lisinopril 10 mg was started 10/2 -cleviprex per neuro IR - hasn't needed this -Continue as needed IV hydralazine   Anxiety -limit benzo to 5 more doses, once [name] day prn   Diarrhea -She has history of chronic diarrhea -now being treated for c diff as above -diarrhea improved    DVT prophylaxis: lovenox Code Status: full Family Communication: husband at bedside Disposition:   Status is: Inpatient Remains inpatient appropriate because: continued need for inpatient care   Consultants:  Neurology Neuro IR  Vascular Surgery GI   Procedures:  Echo IMPRESSIONS     1. Left ventricular ejection fraction, by estimation, is  60 to 65%. The  left ventricle has normal function. The left ventricle has no regional  wall motion abnormalities. There is mild left ventricular hypertrophy.  Left ventricular diastolic parameters  are consistent with Grade I diastolic dysfunction (impaired relaxation).   2. Right ventricular systolic function is normal. The right ventricular  size is normal. Tricuspid regurgitation signal is inadequate for assessing  PA pressure.   3. Resolution is challenging for bubble study; possible small PFO with [name]  few bubble within 3-5 cardiac cycles.   4. The mitral valve is normal in structure. No evidence of mitral valve  regurgitation.   5. The aortic valve is normal in structure. Aortic valve regurgitation is  not visualized.   6. The inferior vena cava is normal in size with greater than 50%  respiratory variability, suggesting right atrial pressure of 3 mmHg.   Conclusion(s)/Recommendation(s): Possible small PFO; considering his age  and comorbidites stroke etiology is multifactorial. Would not w/u for PFO  closure.   Antimicrobials:  Anti-infectives (From admission, onward)    Start     Dose/Rate Route Frequency Ordered Stop   09/15/23 1400  vancomycin (VANCOCIN) capsule 125 mg  Status:  Discontinued        125 mg Oral 4 times daily 09/15/23 1218 09/15/23 1219   09/15/23 1400  vancomycin (VANCOCIN) capsule 125 mg        125 mg Oral 4 times daily 09/15/23 1219 10/05/23 1359   09/14/23 1445  amoxicillin-clavulanate (AUGMENTIN) 875-125 MG per tablet 1 tablet        1 tablet Oral Every 12 hours 09/14/23 1354 09/24/23 0959   09/10/23 1030  piperacillin-tazobactam (ZOSYN) IVPB 3.375 g  Status:  Discontinued        3.375 g 12.5 mL/hr over 240 Minutes Intravenous Every 8 hours 09/10/23 0943 09/14/23 1354       Subjective: Abd pain improved  Objective: Vitals:   09/20/23 0500 09/20/23 0600 09/20/23 0700 09/20/23 0800  BP: (!) 124/54 (!) 129/57 (!) 146/49 (!) 128/53  Pulse: 73 68  66 76  Resp: 12 17 15 14   Temp:    98.1 F (36.7 C)  TempSrc:    Oral  SpO2: 100% 100% 100% (!) 88%  Weight:      Height:        Intake/Output Summary (Last 24 hours) at 09/20/2023 0904 Last data filed at 09/20/2023 0800 Gross per 24 hour  Intake 1566.67 ml  Output 950 ml  Net 616.67 ml   Filed Weights   09/09/23 0410  Weight: 65.8 kg    Examination:  General: No acute distress. Cardiovascular: RRR Lungs: unlabored Abdomen: Soft, nontender, nondistended Neurological: Alert and oriented 3. Moves all extremities 4. Cranial nerves II through XII grossly intact. R groin dressing intact Extremities: No clubbing or cyanosis. No edema.  Data Reviewed: I have personally reviewed following labs and imaging studies  CBC: Recent Labs  Lab 09/14/23 1456 09/15/23 0745 09/16/23 0703  WBC 4.8 4.0 4.2  NEUTROABS 2.6 2.2 2.0  HGB 10.3* 9.8* 9.9*  HCT 33.3* 31.4* 32.2*  MCV 90.2 90.0 89.2  PLT 304 254 251    Basic Metabolic Panel: Recent Labs  Lab 09/14/23 1456 09/15/23 0745 09/16/23 0703 09/20/23 0431  NA 136 135 137 136  K 3.8 3.1* 4.3 4.1  CL 105 103 108 107  CO2 24 23  21* 21*  GLUCOSE 102* 113* 115* 143*  BUN 6* 5* 5* 13  CREATININE 0.77 0.83 0.58 0.68  CALCIUM 8.6* 8.3* 8.4* 8.2*  MG  --  1.4* 2.1 1.6*  PHOS  --  2.9 2.9 3.4    GFR: Estimated Creatinine Clearance: 54.1 mL/min (by C-G formula based on SCr of 0.68 mg/dL).  Liver Function Tests: Recent Labs  Lab 09/14/23 1456 09/15/23 0745 09/16/23 0703 09/20/23 0431  AST 33 26 29 22   ALT 26 26 26 25   ALKPHOS 68 61 62 60  BILITOT 0.6 0.3 0.6 0.6  PROT 5.9* 5.3* 5.4* 5.2*  ALBUMIN 3.0* 2.6* 2.8* 2.8*    CBG: Recent Labs  Lab 09/19/23 0623 09/19/23 0952 09/19/23 1732 09/19/23 2130 09/20/23 0758  GLUCAP 125* 126* 231* 206* 116*     Recent Results (from the past 240 hour(s))  Gastrointestinal Panel by PCR , Stool     Status: None   Collection Time: 09/13/23  9:39 PM   Specimen: STOOL   Result Value Ref Range Status   Campylobacter species NOT DETECTED NOT DETECTED Final   Plesimonas shigelloides NOT DETECTED NOT DETECTED Final   Salmonella species NOT DETECTED NOT DETECTED Final   Yersinia enterocolitica NOT DETECTED NOT DETECTED Final   Vibrio species NOT DETECTED NOT DETECTED Final   Vibrio cholerae NOT DETECTED NOT DETECTED Final   Enteroaggregative E coli (EAEC) NOT DETECTED NOT DETECTED Final   Enteropathogenic E coli (EPEC) NOT DETECTED NOT DETECTED Final   Enterotoxigenic E coli (ETEC) NOT DETECTED NOT DETECTED Final   Shiga like toxin producing E coli (STEC) NOT DETECTED NOT DETECTED Final   Shigella/Enteroinvasive E coli (EIEC) NOT DETECTED NOT DETECTED Final   Cryptosporidium NOT DETECTED NOT DETECTED Final   Cyclospora cayetanensis NOT DETECTED NOT DETECTED Final   Entamoeba histolytica NOT DETECTED NOT DETECTED Final   Giardia lamblia NOT DETECTED NOT DETECTED Final   Adenovirus F40/41 NOT DETECTED NOT DETECTED Final   Astrovirus NOT DETECTED NOT DETECTED Final   Norovirus GI/GII NOT DETECTED NOT DETECTED Final   Rotavirus [place] NOT DETECTED NOT DETECTED Final   Sapovirus (I, II, IV, and V) NOT DETECTED NOT DETECTED Final    Comment: Performed at [facility], [address] [place], [place] [address]  C Difficile Quick Screen (NO PCR Reflex)     Status: Abnormal   Collection Time: 09/13/23  9:40 PM   Specimen: STOOL  Result Value Ref Range Status   C Diff antigen POSITIVE (Hareem Surowiec) NEGATIVE Final   C Diff toxin NEGATIVE NEGATIVE Final   C Diff interpretation   Final    Results are indeterminate. Please contact the provider listed for your campus for C diff questions in AMION.    Comment: Performed at [facility], [address] [place], [place] [address]  Culture, blood (Routine X 2) w Reflex to ID Panel     Status: None   Collection Time: 09/13/23  9:56 PM   Specimen: BLOOD  Result Value Ref Range Status   Specimen Description BLOOD BLOOD  LEFT ARM  Final   Special Requests   Final    BOTTLES DRAWN AEROBIC AND ANAEROBIC Blood Culture adequate volume   Culture   Final    NO GROWTH 5 DAYS Performed at [facility], [address] [place], [place] [address]    Report Status 09/18/2023 FINAL  Final  Culture, blood (Routine X 2) w Reflex to ID Panel  Status: None   Collection Time: 09/13/23  9:57 PM   Specimen: BLOOD LEFT HAND  Result Value Ref Range Status   Specimen Description BLOOD LEFT HAND  Final   Special Requests   Final    BOTTLES DRAWN AEROBIC AND ANAEROBIC Blood Culture adequate volume   Culture   Final    NO GROWTH 5 DAYS Performed at [facility], [address] [place], [place] [address]    Report Status 09/18/2023 FINAL  Final  C. Diff by PCR     Status: Abnormal   Collection Time: 09/14/23  2:40 PM   Specimen: STOOL  Result Value Ref Range Status   Toxigenic C. Difficile by PCR POSITIVE (Burk Hoctor) NEGATIVE Final    Comment: Positive for toxigenic C. difficile with little to no toxin production. Only treat if clinical presentation suggests symptomatic illness. Performed at [facility], [address] [place], [place] [address]   Urine Culture (for pregnant, neutropenic or urologic patients or patients with an indwelling urinary catheter)     Status: None   Collection Time: 09/16/23 11:00 AM   Specimen: Urine, Clean Catch  Result Value Ref Range Status   Specimen Description URINE, CLEAN CATCH  Final   Special Requests NONE  Final   Culture   Final    NO GROWTH Performed at [facility], [address] [place], [place] [address]    Report Status 09/17/2023 FINAL  Final         Radiology Studies: IR ANGIO VERTEBRAL SEL VERTEBRAL UNI R MOD SED  Result Date: 09/20/2023 CLINICAL DATA:  History of vertebrobasilar ischemia. Pre occlusive distal basilar artery extending into the right posterior cerebral artery P1 segment on recent diagnostic catheter arteriogram. EXAM: IR ANGIO  VERTEBRAL SEL VERTEBRAL UNI RIGHT MOD SED COMPARISON:  Recent diagnostic arteriogram of September 09, 2023. MEDICATIONS: Heparin 3,000 units IV. No antibiotic was administered within 1 hour of the procedure. ANESTHESIA/SEDATION: General anesthesia. CONTRAST:  Omnipaque 300 approximately 50 mL. FLUOROSCOPY TIME:  Fluoroscopy Time: 7 minutes 0 seconds (136 mGy). COMPLICATIONS: None immediate. TECHNIQUE: Informed written consent was obtained from the patient after Oakley Kossman thorough discussion of the procedural risks, benefits and alternatives. All questions were addressed. Maximal Sterile Barrier Technique was utilized including caps, mask, sterile gowns, sterile gloves, sterile drape, hand hygiene and skin antiseptic. Ileene Allie timeout was performed prior to the initiation of the procedure. The right groin was prepped and draped in the usual sterile fashion. Thereafter using modified Seldinger technique, transfemoral access into the right common femoral artery was obtained without difficulty. Over an 0.035 inch guidewire, an 8 French 25 cm Pinnacle sheath was inserted. Through this, and also over an 0.035 inch guidewire, Omero Kowal 5 [place] JB 1 catheter was advanced to the aortic arch region and selectively positioned in right subclavian artery distal to the origin of the right vertebral artery. The guidewire was removed good. Good aspiration was obtained from the hub of the diagnostic catheter. FINDINGS: The 5 French diagnostic catheter in the distal right subclavian artery was then exchange for an 035 inch 300 cm Rosen exchange guidewire in the distal right subclavian artery. Over this, Dayln Tugwell combination of Rawn Quiroa 5 [place] JB 1 catheter inside of an 088 80 cm micro sheath was advanced to just proximal to the origin of the right vertebral artery. The exchange wire was removed. Good aspiration was obtained from the hub of the diagnostic catheter. Roadmap was then performed through the JB 1 catheter in  the origin of the right vertebral artery  centered extra cranially. This combination was then advanced to the distal right vertebral artery over an 035 inch Roadrunner guidewire. The guidewire, and the diagnostic catheter were removed. Good aspiration obtained from the hub of the sheath. Ewelina Naves gentle control arteriogram performed through this demonstrated moderate spasm of the vertebral artery which responded to 25 mcg of nitroglycerin. Arteriograms were then performed centered intracranially. Multiple oblique magnified views were obtained. These demonstrated wide patency of the right vertebrobasilar junction and the right posterior-inferior cerebellar artery. The distal basilar artery was now fully patent with approximately 30% stenosis at the origin of the right superior cerebellar artery. Complete patency was now present of the right posterior cerebral artery into the distal distribution. Patency was also seen of the superior cerebellar arteries bilaterally, including the anterior-inferior cerebellar arteries. Given the completely revascularized distal basilar artery, and the right posterior artery proximally, the procedure was stopped. Manual compression for 25 minutes was then held at the right groin puncture site. Distal pulses remained palpable in both feet unchanged. The patient's general anesthesia was reversed, and the patient was extubated. Upon recovery, the patient denied any headaches, nausea or vomiting or visual symptoms. She was able to move all fours equally. She was then transferred to the PACU and then neuro ICU. IMPRESSION: Interval complete revascularization of the previously noted severe pre occlusive distal basilar artery and the proximal P1 segment with Nalany Steedley suggestion of mild stenosis just distal to the origin of the right superior cerebellar artery. PLAN: Follow-up in the clinic in 3 months with CT angiogram of the head and neck. Electronically Signed   By: Julieanne Cotton M.D.   On: 09/20/2023 08:28        Scheduled Meds:   amoxicillin-clavulanate  1 tablet Oral Q12H   aspirin  81 mg Oral Daily   Or   aspirin  81 mg Per Tube Daily   Chlorhexidine Gluconate Cloth  6 each Topical Daily   enoxaparin (LOVENOX) injection  40 mg Subcutaneous Q24H   insulin aspart  0-9 Units Subcutaneous TID WC   lisinopril  10 mg Oral Daily   nebivolol  5 mg Oral Daily   rosuvastatin  20 mg Oral Daily   sodium chloride flush  10 mL Intravenous Q12H   ticagrelor  90 mg Oral BID   Or   ticagrelor  90 mg Per Tube BID   traZODone  50 mg Oral QHS   vancomycin  125 mg Oral QID   Continuous Infusions:      LOS: 11 days    Time spent: over 30 min    Lacretia Nicks, MD [facility]   To contact the attending provider between 7A-7P or the covering provider during after hours 7P-7A, please log into the web site [email or web address] and access using universal [place] password for that web site. If you do not have the password, please call the hospital operator.  09/20/2023, 9:04 AM

## 2023-09-20 NOTE — Progress Notes (Signed)
Physical Therapy Treatment Patient Details Name: [name] MRN: [id number] DOB: 11-14-1946 Today's Date: 09/20/2023   History of Present Illness 77 yo female work up for TIA, chronic diarrhea, abnormal colon CT and occlusion of stenosis basilar artery. PT s/p 10/7 R vertebral artery angiogram revascularization PMH HTN HLD diverticulosis DM2 ETOH abuse  remission R ICA s/p stenting 8/25-8/29 for GIB, appendectomy    PT Comments  Pt received in bed, awaiting PT for d/c, husband present. Pt has RW and SPC at home, no equipment needs. Reviewed safety precautions with pt as she tends to move impulsively. Pt came to EOB and standing without physical assist. Pt ambulated 300' with RW until last 2' and then no AD. Pt able to change directions and speeds without LOB. Instructed to begin day using RW until she feels more stable and then ambulate without AD. Pt set for d/c home, recommend f/u with outpt PT.      If plan is discharge home, recommend the following: A little help with walking and/or transfers;A little help with bathing/dressing/bathroom;Assistance with cooking/housework;Assist for transportation;Help with stairs or ramp for entrance   Can travel by private vehicle        Equipment Recommendations  None recommended by PT    Recommendations for Other Services       Precautions / Restrictions Precautions Precautions: Fall Restrictions Weight Bearing Restrictions: No     Mobility  Bed Mobility Overal bed mobility: Modified Independent Bed Mobility: Supine to Sit, Sit to Supine     Supine to sit: Modified independent (Device/Increase time)     General bed mobility comments: Mod I extra time,no physical assist    Transfers Overall transfer level: Needs assistance Equipment used: None Transfers: Sit to/from Stand Sit to Stand: Supervision           General transfer comment: supervision for safety    Ambulation/Gait Ambulation/Gait assistance:  Supervision Gait Distance (Feet): 300 Feet Assistive device: None, Rolling walker (2 wheels) Gait Pattern/deviations: Step-through pattern, Decreased stride length, Trunk flexed Gait velocity: decr Gait velocity interpretation: >2.62 ft/sec, indicative of community ambulatory   General Gait Details: pt prefers to ambulate without RW but cued to begin with this for safety and instructed to begin her day with RW until she feels steady once home. Ambulated last 26' without AD. No LOB, able to change speed and direction   Stairs             Wheelchair Mobility     Tilt Bed    Modified Rankin (Stroke Patients Only) Modified Rankin (Stroke Patients Only) Pre-Morbid Rankin Score: No symptoms Modified Rankin: Moderately severe disability     Balance Overall balance assessment: Needs assistance Sitting-balance support: Bilateral upper extremity supported, Feet supported Sitting balance-Leahy Scale: Good     Standing balance support: Single extremity supported, During functional activity Standing balance-Leahy Scale: Fair Standing balance comment: static stance without UE support, no LOB                            Cognition Arousal: Alert Behavior During Therapy: WFL for tasks assessed/performed Overall Cognitive Status: Impaired/Different from baseline Area of Impairment: Safety/judgement, Awareness, Problem solving                         Safety/Judgement: Decreased awareness of deficits, Decreased awareness of safety Awareness: Emergent Problem Solving: Slow processing General Comments: vc's for safety  Exercises      General Comments General comments (skin integrity, edema, etc.): VSS. Pt with mild trunk flexion during ambulation and reports that she has been going to outpt PT for her neck/ posture. Reviewed current program with this      Pertinent Vitals/Pain      Home Living Family/patient expects to be discharged to:: Private  residence Living Arrangements: Spouse/significant other Available Help at Discharge: Family;Available 24 hours/day Type of Home: House Home Access: Stairs to enter Entrance Stairs-Rails: Right Entrance Stairs-Number of Steps: 3-4   Home Layout: Two level;Full bath on main level;Able to live on main level with bedroom/bathroom        Prior Function            PT Goals (current goals can now be found in the care plan section) Acute Rehab PT Goals Patient Stated Goal: none PT Goal Formulation: With patient Time For Goal Achievement: 09/23/23 Potential to Achieve Goals: Good Progress towards PT goals: Progressing toward goals    Frequency    Min 1X/week      PT Plan      Co-evaluation              AM-PAC PT "6 Clicks" Mobility   Outcome Measure  Help needed turning from your back to your side while in a flat bed without using bedrails?: None Help needed moving from lying on your back to sitting on the side of a flat bed without using bedrails?: None Help needed moving to and from a bed to a chair (including a wheelchair)?: None Help needed standing up from a chair using your arms (e.g., wheelchair or bedside chair)?: None Help needed to walk in hospital room?: A Little Help needed climbing 3-5 steps with a railing? : A Little 6 Click Score: 22    End of Session   Activity Tolerance: Patient tolerated treatment well Patient left: in bed;with bed alarm set;with call bell/phone within reach;with family/visitor present Nurse Communication: Mobility status PT Visit Diagnosis: Unsteadiness on feet (R26.81);Hemiplegia and hemiparesis;Difficulty in walking, not elsewhere classified (R26.2);Other symptoms and signs involving the nervous system (R29.898) Hemiplegia - Right/Left: Right Hemiplegia - caused by: Unspecified     Time: 1352-1406 PT Time Calculation (min) (ACUTE ONLY): 14 min  Charges:    $Gait Training: 8-22 mins PT General Charges $$ ACUTE PT  VISIT: 1 Visit                     Lyanne Co, PT  Acute Rehab Services Secure chat preferred Office [phone]    Lawana Chambers Jamyson Jirak 09/20/2023, 3:37 PM

## 2023-09-20 NOTE — Progress Notes (Signed)
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   Subjective: Patient awake/alert resting comfortably in bed. She has no complaints this morning and she is anticipating discharge from the hospital today.   Allergies: Diflucan [fluconazole], Latex, Lipitor [atorvastatin calcium], Codeine, Pentazocine lactate, and Sulfonamide derivatives  Medications: Prior to Admission medications   Medication Sig Start Date End Date Taking? Authorizing Provider  amphetamine-dextroamphetamine (ADDERALL XR) 20 MG 24 hr capsule Take 40 mg by mouth every morning. 06/29/23  Yes [provider]  aspirin EC 81 MG EC tablet Take 1 tablet (81 mg total) by mouth daily. Swallow whole. 08/08/20  Yes Stegmayer, Cala Bradford A, PA-C  B Complex Vitamins (B COMPLEX-B12) TABS Take 1 tablet by mouth at bedtime.   Yes [provider]  BYSTOLIC 5 MG tablet TAKE 1 TABLET DAILY Patient taking differently: Take 5 mg by mouth daily. 09/26/15  Yes Carlus Pavlov, MD  clopidogrel (PLAVIX) 75 MG tablet TAKE 1 TABLET BY MOUTH EVERY DAY Patient taking differently: Take 75 mg by mouth daily. 07/18/23  Yes Georgiana Spinner, NP  diphenoxylate-atropine (LOMOTIL) 2.5-0.025 MG tablet Take 1 tablet by mouth 4 (four) times daily as needed for diarrhea or loose stools.  07/08/10  Yes [provider]  Dulaglutide (TRULICITY) 3 MG/0.5ML SOPN Inject 3 mg into the skin once a week. 06/23/23  Yes   empagliflozin (JARDIANCE) 25 MG TABS tablet Take 25 mg by mouth daily.   Yes [provider]  EPINEPHrine 0.3 mg/0.3 mL IJ SOAJ injection Inject 0.3 mg into the muscle as needed for anaphylaxis.   Yes [provider]  lisinopril (PRINIVIL,ZESTRIL) 5 MG tablet TAKE 1  TABLET BY MOUTH EVERY DAY Patient taking differently: Take 5 mg by mouth daily. 07/14/15  Yes Carlus Pavlov, MD  metFORMIN (GLUCOPHAGE) 1000 MG tablet Take 1,000 mg by mouth every evening. 10/19/21  Yes [provider]  omeprazole (PRILOSEC) 40 MG capsule Take 40 mg by mouth daily. 08/30/18  Yes [provider]  pregabalin (LYRICA) 300 MG capsule Take 300 mg by mouth 2 (two) times daily. 08/05/20  Yes [provider]  glucose blood (BAYER CONTOUR NEXT TEST) test strip Use to test blood sugar 3 times daily as instructed. Dx code: E11.65 02/10/15   Carlus Pavlov, MD  Lancets (ACCU-CHEK MULTICLIX) lancets Use to test blood sugar 3 times daily as instructed. Dx: E11.65 02/10/15   Carlus Pavlov, MD     Vital Signs: BP 131/68   Pulse 76   Temp 98.1 F (36.7 C) (Oral)   Resp 14   Ht 5\' 2"  (1.575 m)   Wt 145 lb (65.8 kg)   SpO2 (!) 88%   BMI 26.52 kg/m   Physical Exam Constitutional:      General: She is not in acute distress.    Appearance: She is not ill-appearing.  HENT:     Mouth/Throat:     Mouth: Mucous membranes are moist.     Pharynx: Oropharynx is clear.  Cardiovascular:     Rate and Rhythm: Normal rate.     Comments: Right groin vascular site is clean, soft, dry and non-tender. Mild ecchymosis around the site.  Pulmonary:     Effort: Pulmonary effort is normal.  Abdominal:     Tenderness: There is no abdominal tenderness.  Skin:    General: Skin is warm and dry.  Neurological:     Mental Status: She is alert and oriented to person, place, and time.  Psychiatric:        Mood and Affect: Mood normal.        Behavior: Behavior normal.        Thought Content: Thought content normal.        Judgment: Judgment normal.     Imaging: IR ANGIO VERTEBRAL SEL VERTEBRAL UNI R MOD SED  Result Date: 09/20/2023 CLINICAL DATA:  History of vertebrobasilar ischemia. Pre occlusive distal basilar artery extending into the right posterior cerebral  artery P1 segment on recent diagnostic catheter arteriogram. EXAM: IR ANGIO VERTEBRAL SEL VERTEBRAL UNI RIGHT MOD SED COMPARISON:  Recent diagnostic arteriogram of September 09, 2023. MEDICATIONS: Heparin 3,000 units IV. No antibiotic was administered within 1 hour of the procedure. ANESTHESIA/SEDATION: General anesthesia. CONTRAST:  Omnipaque 300 approximately 50 mL. FLUOROSCOPY TIME:  Fluoroscopy Time: 7 minutes 0 seconds (136 mGy). COMPLICATIONS: None immediate. TECHNIQUE: Informed written consent was obtained from the patient after a thorough discussion of the procedural risks, benefits and alternatives. All questions were addressed. Maximal Sterile Barrier Technique was utilized including caps, mask, sterile gowns, sterile gloves, sterile drape, hand hygiene and skin antiseptic. A timeout was performed prior to the initiation of the procedure. The right groin was prepped and draped in the usual sterile fashion. Thereafter using modified Seldinger technique, transfemoral access into the right common femoral artery was obtained without difficulty. Over an 0.035 inch guidewire, an 8 French 25 cm Pinnacle sheath was inserted. Through this, and also over an 0.035 inch guidewire, a 5 [place] JB 1 catheter was advanced to the aortic arch region and selectively positioned in right subclavian artery distal to the origin of the right vertebral artery. The guidewire was removed good. Good aspiration was obtained from the hub of the diagnostic catheter. FINDINGS: The 5 French diagnostic catheter in the distal right subclavian artery was then exchange for an 035 inch 300 cm Rosen exchange guidewire in the distal right subclavian artery. Over this, a combination of a 5 [place] JB 1 catheter inside of an 088 80 cm micro sheath was advanced to just proximal to the origin of the right vertebral artery. The exchange wire was removed. Good aspiration was obtained from the hub of the diagnostic catheter. Roadmap was then performed  through the JB 1 catheter in the origin of the right vertebral artery centered extra cranially. This combination was then advanced to the distal right vertebral artery over an 035 inch Roadrunner guidewire. The guidewire, and the diagnostic catheter were removed. Good aspiration obtained from the hub of the sheath. A gentle control arteriogram performed through this demonstrated moderate spasm of the vertebral artery which responded to 25 mcg of nitroglycerin. Arteriograms were then performed centered intracranially. Multiple oblique magnified views were obtained. These demonstrated wide patency of the right vertebrobasilar junction and the right posterior-inferior cerebellar artery. The distal basilar artery was now fully patent with approximately 30% stenosis at the origin of the right superior cerebellar artery. Complete patency was now present of the right posterior cerebral artery into the distal distribution. Patency was also seen of the superior cerebellar arteries bilaterally, including the anterior-inferior cerebellar arteries. Given the completely revascularized distal basilar artery, and the right posterior artery proximally, the procedure was stopped. Manual compression for 25 minutes was then held at the right groin puncture site. Distal pulses remained palpable in both feet unchanged.  The patient's general anesthesia was reversed, and the patient was extubated. Upon recovery, the patient denied any headaches, nausea or vomiting or visual symptoms. She was able to move all fours equally. She was then transferred to the PACU and then neuro ICU. IMPRESSION: Interval complete revascularization of the previously noted severe pre occlusive distal basilar artery and the proximal P1 segment with a suggestion of mild stenosis just distal to the origin of the right superior cerebellar artery. PLAN: Follow-up in the clinic in 3 months with CT angiogram of the head and neck. Electronically Signed   By: Julieanne Cotton M.D.   On: 09/20/2023 08:28    Labs:  CBC: Recent Labs    09/13/23 0623 09/14/23 1456 09/15/23 0745 09/16/23 0703  WBC 4.3 4.8 4.0 4.2  HGB 9.6* 10.3* 9.8* 9.9*  HCT 31.2* 33.3* 31.4* 32.2*  PLT 295 304 254 251    COAGS: Recent Labs    08/07/23 0944 09/09/23 0539  INR 0.9 1.0  APTT 26 22*    BMP: Recent Labs    09/14/23 1456 09/15/23 0745 09/16/23 0703 09/20/23 0431  NA 136 135 137 136  K 3.8 3.1* 4.3 4.1  CL 105 103 108 107  CO2 24 23 21* 21*  GLUCOSE 102* 113* 115* 143*  BUN 6* 5* 5* 13  CALCIUM 8.6* 8.3* 8.4* 8.2*  CREATININE 0.77 0.83 0.58 0.68  GFRNONAA >60 >60 >60 >60    LIVER FUNCTION TESTS: Recent Labs    09/14/23 1456 09/15/23 0745 09/16/23 0703 09/20/23 0431  BILITOT 0.6 0.3 0.6 0.6  AST 33 26 29 22   ALT 26 26 26 25   ALKPHOS 68 61 62 60  PROT 5.9* 5.3* 5.4* 5.2*  ALBUMIN 3.0* 2.6* 2.8* 2.8*    Assessment and Plan:  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   Patient assessed at the bedside this morning. Dr. Corliss Skains discussed the findings from the angiogram with her and notified her that he did not perform an intervention. She is to remain on aspirin and Brilinta until notified otherwise. She will need repeat CT Angiogram of the head and neck in 3 months. IR recommends she follow up with Neurology outpatient. An order has been placed and a scheduler from our office will call her with a date/time of her imaging study.   Electronically Signed: Alwyn Ren, AGACNP-BC [phone] 09/20/2023, 10:03 AM   I spent a total of 15 Minutes at the the patient's bedside AND on the patient's hospital floor or unit, greater than 50% of which was counseling/coordinating care s/p diagnostic cerebral angiogram.

## 2023-09-20 NOTE — Evaluation (Signed)
Speech Language Pathology Evaluation Patient Details Name: [name] MRN: [id number] DOB: 1945/12/20 Today's Date: 09/20/2023 Time: 4098-1191 SLP Time Calculation (min) (ACUTE ONLY): 16 min  Problem List:  Patient Active Problem List   Diagnosis Date Noted   Occlusion and stenosis of basilar artery 09/19/2023   Chronic diarrhea 09/13/2023   Abnormal CT scan, colon 09/13/2023   TIA (transient ischemic attack) 09/09/2023   Hemorrhagic shock (HCC) 08/08/2023   ABLA (acute blood loss anemia) 08/08/2023   Lower GI bleed 08/07/2023   Acute blood loss anemia (ABLA) 08/07/2023   Hyperkalemia 08/07/2023   Hypocalcemia 08/07/2023   Mild protein malnutrition (HCC) 08/07/2023   Statin myopathy 08/04/2022   Carotid stenosis, bilateral 08/07/2020   Statin intolerance 02/22/2020   Carotid artery stenosis 10/03/2018   CAP (community acquired pneumonia) 05/05/2018   Acute respiratory failure with hypoxia (HCC) 05/05/2018   DM (diabetes mellitus), type 2, uncontrolled 05/05/2018   Tobacco dependency 05/05/2018   SOB (shortness of breath) on exertion 03/09/2018   UTI (urinary tract infection) 01/25/2016   Elevated LFTs 01/24/2016   Hyperbilirubinemia    History of alcohol abuse 09/16/2014   Essential hypertension, benign 09/16/2014   Anxiety state 09/16/2014   Chronic pain syndrome 09/16/2014   Alcohol intoxication (HCC) 08/28/2014   Hepatitis 08/28/2014   DM (diabetes mellitus), type 2, uncontrolled w/ophthalmic complication (HCC) 07/22/2014   Hypotension 07/14/2014   Alcohol withdrawal (HCC) 07/14/2014   EtOH dependence (HCC) 06/23/2014   Alcohol withdrawal with perceptual disturbances (HCC) 06/09/2014   Hyponatremia 06/09/2014   Alcohol abuse, continuous 05/29/2012    Class: Acute   Chest pain on exertion 03/28/2012   Elevated C-reactive protein 03/28/2012   Encounter for long-term (current) use of medications 03/28/2012   H/O urethrotomy 03/28/2012   Myalgia and myositis  03/28/2012   Nausea 03/28/2012   Tachycardia 03/28/2012   Diabetes mellitus, type 2 (HCC) 03/28/2012   Hyperlipidemia 08/20/2007   Anxiety state 08/20/2007   DEPRESSION 08/20/2007   Essential hypertension 08/20/2007   Past Medical History:  Past Medical History:  Diagnosis Date   Alcohol abuse    CAP (community acquired pneumonia) 05/05/2018   Carotid artery stenosis    Depression    Diabetes mellitus    Diverticulitis    Essential hypertension 08/20/2007   Qualifier: Diagnosis of   By: Jonny Ruiz MD, Len Blalock        Fibromyalgia    Hepatitis 08/28/2014   Hyperlipidemia 08/20/2007   Qualifier: Diagnosis of   By: Jonny Ruiz MD, Len Blalock        Hypertension    Myalgia and myositis 03/28/2012   Past Surgical History:  Past Surgical History:  Procedure Laterality Date   BUNIONECTOMY     CAROTID PTA/STENT INTERVENTION Left 08/07/2020   Procedure: CAROTID PTA/STENT INTERVENTION;  Surgeon: Annice Needy, MD;  Location: [facility];  Service: Cardiovascular;  Laterality: Left;   CESAREAN SECTION     COLONOSCOPY     COLONOSCOPY WITH ESOPHAGOGASTRODUODENOSCOPY (EGD)     COLONOSCOPY WITH PROPOFOL N/A 02/18/2021   Procedure: COLONOSCOPY WITH PROPOFOL;  Surgeon: Toledo, Boykin Nearing, MD;  Location: [facility] ENDOSCOPY;  Service: Gastroenterology;  Laterality: N/A;   ESOPHAGOGASTRODUODENOSCOPY (EGD) WITH PROPOFOL N/A 02/18/2021   Procedure: ESOPHAGOGASTRODUODENOSCOPY (EGD) WITH PROPOFOL;  Surgeon: Toledo, Boykin Nearing, MD;  Location: [facility] ENDOSCOPY;  Service: Gastroenterology;  Laterality: N/A;   IR ANGIO INTRA EXTRACRAN SEL COM CAROTID INNOMINATE BILAT MOD SED  09/09/2023   IR ANGIO VERTEBRAL SEL VERTEBRAL BILAT MOD SED  09/09/2023   IR ANGIO VERTEBRAL SEL VERTEBRAL UNI R MOD SED  09/19/2023   TONSILLECTOMY     HPI:  [name] is a 77 yo female presenting to ED 9/27 with n/v, AMS, and mild R sided numbness. Arteriogram revealed severe tapered stenosis of distal basilar artery extending into R PCA P1  segment with approximately 50% stenosis of proximal R ICA. Underwent cerebral angiogram 10/7. PMH includes HTN, HLD, diverticulitis, myalgias/myositis/fibromyalgia, carotid artery stenosis, R ICA stenosis s/p stenting, former alcohol use, appendectomy 2 weeks post op   Assessment / Plan / Recommendation Clinical Impression  Pt reports history of difficulties related to problem solving and memory. She states that she lives at home with her husband and is typically independent with all ADLs. Pt scored 23/30 on the SLUMS (a score of 27 or above is considered WFL) characterized by deficits related to memory and problem solving. She appears quite impulsive at times, answering prior to SLP completing questions. Suspect pt may benefit from ongoing SLP f/u to address compensatory strategies. Recommend OP SLP with no further acute ST indicated at this time.    SLP Assessment  SLP Recommendation/Assessment: All further Speech Lanaguage Pathology  needs can be addressed in the next venue of care SLP Visit Diagnosis: Cognitive communication deficit (R41.841)    Recommendations for follow up therapy are one component of a multi-disciplinary discharge planning process, led by the attending physician.  Recommendations may be updated based on patient status, additional functional criteria and insurance authorization.    Follow Up Recommendations  Outpatient SLP    Assistance Recommended at Discharge  Intermittent Supervision/Assistance  Functional Status Assessment Patient has had a recent decline in their functional status and demonstrates the ability to make significant improvements in function in a reasonable and predictable amount of time.  Frequency and Duration           SLP Evaluation Cognition  Overall Cognitive Status: Impaired/Different from baseline Arousal/Alertness: Awake/alert Orientation Level: Oriented X4 Attention: Sustained Sustained Attention: Impaired Sustained Attention Impairment:  Verbal basic Memory: Impaired Memory Impairment: Storage deficit;Retrieval deficit Awareness: Impaired Awareness Impairment: Emergent impairment Problem Solving: Impaired Problem Solving Impairment: Verbal basic       Comprehension  Auditory Comprehension Overall Auditory Comprehension: Appears within functional limits for tasks assessed    Expression Expression Primary Mode of Expression: Verbal Verbal Expression Overall Verbal Expression: Appears within functional limits for tasks assessed Written Expression Dominant Hand: Right   Oral / Motor  Oral Motor/Sensory Function Overall Oral Motor/Sensory Function: Within functional limits Motor Speech Overall Motor Speech: Appears within functional limits for tasks assessed            Gwynneth Aliment, M.A., CF-SLP Speech Language Pathology, Acute Rehabilitation Services  Secure Chat preferred [phone]  09/20/2023, 12:36 PM

## 2023-09-23 ENCOUNTER — Ambulatory Visit (INDEPENDENT_AMBULATORY_CARE_PROVIDER_SITE_OTHER): Payer: Medicare HMO | Admitting: Vascular Surgery

## 2023-09-23 ENCOUNTER — Other Ambulatory Visit (HOSPITAL_COMMUNITY): Payer: Self-pay

## 2023-09-27 ENCOUNTER — Encounter (INDEPENDENT_AMBULATORY_CARE_PROVIDER_SITE_OTHER): Payer: Self-pay | Admitting: Vascular Surgery

## 2023-09-27 ENCOUNTER — Ambulatory Visit (INDEPENDENT_AMBULATORY_CARE_PROVIDER_SITE_OTHER): Payer: Medicare HMO | Admitting: Vascular Surgery

## 2023-09-27 VITALS — BP 149/82 | HR 85 | Resp 16 | Wt 143.6 lb

## 2023-09-27 DIAGNOSIS — G459 Transient cerebral ischemic attack, unspecified: Secondary | ICD-10-CM | POA: Diagnosis not present

## 2023-09-27 DIAGNOSIS — I10 Essential (primary) hypertension: Secondary | ICD-10-CM | POA: Diagnosis not present

## 2023-09-27 DIAGNOSIS — E1139 Type 2 diabetes mellitus with other diabetic ophthalmic complication: Secondary | ICD-10-CM

## 2023-09-27 DIAGNOSIS — E785 Hyperlipidemia, unspecified: Secondary | ICD-10-CM

## 2023-09-27 DIAGNOSIS — I651 Occlusion and stenosis of basilar artery: Secondary | ICD-10-CM

## 2023-09-27 DIAGNOSIS — I6523 Occlusion and stenosis of bilateral carotid arteries: Secondary | ICD-10-CM

## 2023-09-27 DIAGNOSIS — K551 Chronic vascular disorders of intestine: Secondary | ICD-10-CM

## 2023-09-27 NOTE — Assessment & Plan Note (Signed)
Treated earlier this month with intervention by neuroradiology in [place].

## 2023-09-27 NOTE — Progress Notes (Signed)
[image]   MRN : [id number]  [name] is a 77 y.o. (07-06-1946) female who presents with chief complaint of  Chief Complaint Patient presents with  Follow-up   ED follow up .  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  She had extensive workup and treatment in [place].  Her neurologic symptoms have resolved.  As part of her extensive workup she had multiple imaging studies.  Her previously placed carotid stent appears to have good flow and be patent.  She developed C. difficile colitis.  She has had extensive diarrhea with this.  She has not really had postprandial abdominal pain or food fear.  She did lose some weight with this episode, but has stabilized and this does not seem to be an ongoing issue.  Current Outpatient Medications Medication Sig Dispense Refill  amphetamine-dextroamphetamine (ADDERALL XR) 20 MG 24 hr capsule Take 2 capsules (40 mg total) by mouth every morning. Discuss with neurology before resuming this medicine in the setting of your mini stroke (don't take until you discuss with neurology)    aspirin EC 81 MG EC tablet Take 1 tablet (81 mg total) by mouth daily. Swallow whole. 90 tablet 3  B Complex Vitamins (B COMPLEX-B12) TABS Take 1 tablet by mouth at bedtime.    BYSTOLIC 5 MG tablet TAKE 1 TABLET DAILY (Patient taking differently: Take 5 mg by mouth daily.) 90 tablet 0  diphenoxylate-atropine (LOMOTIL) 2.5-0.025 MG tablet Take 1 tablet by mouth 4 (four) times daily as needed for diarrhea or loose stools. Avoid use until you complete therapy for your C diff infection (until completion of oral vancomycin)    Dulaglutide (TRULICITY) 3 MG/0.5ML SOPN Inject 3 mg into the skin once a week. 6 mL 1  EPINEPHrine 0.3 mg/0.3 mL IJ SOAJ injection Inject 0.3 mg into the muscle as needed for anaphylaxis.    glucose blood (BAYER CONTOUR NEXT TEST) test  strip Use to test blood sugar 3 times daily as instructed. Dx code: E11.65 100 each 11  Lancets (ACCU-CHEK MULTICLIX) lancets Use to test blood sugar 3 times daily as instructed. Dx: E11.65 100 each 11  lisinopril (ZESTRIL) 10 MG tablet Take 1 tablet (10 mg total) by mouth daily. Follow your blood pressures with your outpatient provider for refills 90 tablet 0  metFORMIN (GLUCOPHAGE) 1000 MG tablet Take 1,000 mg by mouth every evening.    omeprazole (PRILOSEC) 40 MG capsule Take 40 mg by mouth daily.  2  pregabalin (LYRICA) 300 MG capsule Take 300 mg by mouth 2 (two) times daily.    rosuvastatin (CRESTOR) 20 MG tablet Take 1 tablet (20 mg total) by mouth daily. 90 tablet 0  ticagrelor (BRILINTA) 90 MG TABS tablet Take 1 tablet (90 mg total) by mouth 2 (two) times daily. Follow up with neurology for refills 180 tablet 0  vancomycin (VANCOCIN) 125 MG capsule Take 1 capsule (125 mg total) by mouth 4 (four) times daily for 11 days. If you need antibiotics in the future, please let your doctor know about your history of C diff 44 capsule 0  No current facility-administered medications for this visit.   Past Medical History: Diagnosis Date  Alcohol abuse   CAP (community acquired pneumonia) 05/05/2018  Carotid artery stenosis   Depression   Diabetes mellitus   Diverticulitis   Essential hypertension 08/20/2007  Qualifier: Diagnosis of   By: Jonny Ruiz MD, Len Blalock  Fibromyalgia   Hepatitis 08/28/2014  Hyperlipidemia 08/20/2007  Qualifier: Diagnosis of   By: Jonny Ruiz MD, Len Blalock       Hypertension   Myalgia and myositis 03/28/2012   Past Surgical History: Procedure Laterality Date  BUNIONECTOMY    CAROTID PTA/STENT INTERVENTION Left 08/07/2020  Procedure: CAROTID PTA/STENT INTERVENTION;  Surgeon: Annice Needy, MD;  Location: [facility];  Service: Cardiovascular;  Laterality: Left;  CESAREAN SECTION    COLONOSCOPY    COLONOSCOPY WITH ESOPHAGOGASTRODUODENOSCOPY (EGD)    COLONOSCOPY  WITH PROPOFOL N/A 02/18/2021  Procedure: COLONOSCOPY WITH PROPOFOL;  Surgeon: Toledo, Boykin Nearing, MD;  Location: [facility] ENDOSCOPY;  Service: Gastroenterology;  Laterality: N/A;  ESOPHAGOGASTRODUODENOSCOPY (EGD) WITH PROPOFOL N/A 02/18/2021  Procedure: ESOPHAGOGASTRODUODENOSCOPY (EGD) WITH PROPOFOL;  Surgeon: Toledo, Boykin Nearing, MD;  Location: [facility] ENDOSCOPY;  Service: Gastroenterology;  Laterality: N/A;  IR ANGIO INTRA EXTRACRAN SEL COM CAROTID INNOMINATE BILAT MOD SED  09/09/2023  IR ANGIO VERTEBRAL SEL VERTEBRAL BILAT MOD SED  09/09/2023  IR ANGIO VERTEBRAL SEL VERTEBRAL UNI R MOD SED  09/19/2023  RADIOLOGY WITH ANESTHESIA N/A 09/19/2023  Procedure: ANGIOPLASTY/STENT OF BASILAR ARTERY WITH ANESTHESIA;  Surgeon: Julieanne Cotton, MD;  Location[facility];  Service: Radiology;  Laterality: N/A;  TONSILLECTOMY      Social History  Tobacco Use  Smoking status: Every Day   Current packs/day: 0.00   Types: Cigarettes, E-cigarettes   Last attempt to quit: 03/11/2010   Years since quitting: 13.5  Smokeless tobacco: Never Vaping Use  Vaping status: Former Substance Use Topics  Alcohol use: Not Currently  Drug use: No   Comment: Pt denies       Family History Problem Relation Age of Onset  Cancer Mother   Heart disease Father   Heart disease Sister     Allergies Allergen Reactions  Diflucan [Fluconazole] Nausea And Vomiting  Latex Hives  Lipitor [Atorvastatin Calcium] Other (See Comments)   Muscle aches  Codeine Rash   And nausea   Pentazocine Lactate Rash  Sulfonamide Derivatives Rash    REVIEW OF SYSTEMS (Negative unless checked)  Constitutional: [ ] Weight loss  [ ] Fever  [ ] Chills Cardiac: [ ] Chest pain   [ ] Chest pressure   [ ] Palpitations   [ ] Shortness of breath when laying flat   [ ] Shortness of breath at rest   [ ] Shortness of breath with exertion. Vascular:  [ ] Pain in legs with walking   [ ] Pain in legs at rest   [ ] Pain in legs when laying flat   [ ] Claudication   [  ]Pain in feet when walking  [ ] Pain in feet at rest  [ ] Pain in feet when laying flat   [ ] History of DVT   [ ] Phlebitis   [ ] Swelling in legs   [ ] Varicose veins   [ ] Non-healing ulcers Pulmonary:   [ ] Uses home oxygen   [ ] Productive cough   [ ] Hemoptysis   [ ] Wheeze  [ ] COPD   [ ] Asthma Neurologic:  [ ] Dizziness  [ ] Blackouts   [ ] Seizures   [x] History of stroke   [x] History of TIA  [ ] Aphasia   [ ] Temporary blindness   [ ] Dysphagia   [ ] Weakness or numbness in arms   [ ] Weakness or numbness in legs Musculoskeletal:  [ ] Arthritis   [ ] Joint swelling   [ ] Joint pain   [ ] Low back pain Hematologic:  [ ] Easy bruising  [ ] Easy bleeding   [ ] Hypercoagulable state   [ ] Anemic  Gastrointestinal:  [ ] Blood in stool   [ ] Vomiting blood  [ ] Gastroesophageal reflux/heartburn   [x] Abdominal pain Genitourinary:  [ ] Chronic kidney disease   [ ] Difficult urination  [ ] Frequent urination  [ ] Burning with urination   [ ] Hematuria Skin:  [ ] Rashes   [ ] Ulcers   [ ] Wounds Psychological:  [ ] History of anxiety   [ ]  History of major depression.  Physical Examination  BP (!) 149/82 (BP Location: Left Arm)   Pulse 85   Resp 16   Wt 143 lb 9.6 oz (65.1 kg)   BMI 26.26 kg/m  Gen:  WD/WN, NAD Head: [place]/AT, No temporalis wasting. Ear/Nose/Throat: Hearing grossly intact, nares w/o erythema or drainage Eyes: Conjunctiva clear. Sclera non-icteric Neck: Supple.  Trachea midline Pulmonary:  Good air movement, no use of accessory muscles.  Cardiac: RRR, no JVD Vascular:  Vessel Right Left Radial Palpable Palpable                   PT Palpable Palpable DP Palpable Palpable  Gastrointestinal: soft, non-tender/non-distended. No guarding/reflex.  Musculoskeletal: M/S 5/5 throughout.  No deformity or atrophy. No edema. Neurologic: Sensation grossly intact in extremities.  Symmetrical.  Speech is fluent.  Psychiatric: Judgment intact, Mood & affect appropriate for pt's clinical situation. Dermatologic: No  rashes or ulcers noted.  No cellulitis or open wounds.      Labs Recent Results (from the past 2160 hour(s)) Comprehensive metabolic panel     Status: Abnormal  Collection Time: 08/07/23  9:19 AM Result Value Ref Range  Sodium 136 135 - 145 mmol/L  Potassium 5.2 (H) 3.5 - 5.1 mmol/L  Chloride 105 98 - 111 mmol/L  CO2 23 22 - 32 mmol/L  Glucose, Bld 163 (H) 70 - 99 mg/dL   Comment: Glucose reference range applies only to samples taken after fasting for at least 8 hours.  BUN 36 (H) 8 - 23 mg/dL  Creatinine, Ser 4.09 (H) 0.44 - 1.00 mg/dL  Calcium 8.0 (L) 8.9 - 10.3 mg/dL  Total Protein 6.2 (L) 6.5 - 8.1 g/dL  Albumin 3.3 (L) 3.5 - 5.0 g/dL  AST 26 15 - 41 U/L  ALT 11 0 - 44 U/L  Alkaline Phosphatase 60 38 - 126 U/L  Total Bilirubin 0.6 0.3 - 1.2 mg/dL  GFR, Estimated 55 (L) >60 mL/min   Comment: (NOTE) Calculated using the CKD-EPI Creatinine Equation (2021)   Anion gap 8 5 - 15   Comment: Performed at [facility], [address] [place], [place] [address] CBC     Status: Abnormal  Collection Time: 08/07/23  9:19 AM Result Value Ref Range  WBC 9.2 4.0 - 10.5 K/uL  RBC 3.57 (L) 3.87 - 5.11 MIL/uL  Hemoglobin 9.3 (L) 12.0 - 15.0 g/dL  HCT 47.8 (L) 29.5 - 62.1 %  MCV 88.2 80.0 - 100.0 fL  MCH 26.1 26.0 - 34.0 pg  MCHC 29.5 (L) 30.0 - 36.0 g/dL  RDW 30.8 (H) 65.7 - 84.6 %  Platelets 268 150 - 400 K/uL  nRBC 0.0 0.0 - 0.2 %   Comment: Performed at [facility], [address] [place], [place] [address] Type and screen [facility]     Status: None  Collection Time: 08/07/23  9:19 AM Result Value Ref Range  ABO/RH(D) A POS   Antibody Screen NEG   Sample Expiration 08/07/2023,2359  POC occult blood, ED     Status: Abnormal  Collection Time: 08/07/23  9:28  AM Result Value Ref Range  Fecal Occult Bld POSITIVE (A) NEGATIVE Protime-INR     Status: None  Collection Time: 08/07/23  9:44 AM Result Value Ref  Range  Prothrombin Time 12.7 11.4 - 15.2 seconds  INR 0.9 0.8 - 1.2   Comment: (NOTE) INR goal varies based on device and disease states. Performed at [facility], [address] [place], [place] [address]  APTT     Status: None  Collection Time: 08/07/23  9:44 AM Result Value Ref Range  aPTT 26 24 - 36 seconds   Comment: Performed at [facility], [address], [place], [place] [address] MRSA Next Gen by PCR, Nasal     Status: Abnormal  Collection Time: 08/07/23  2:47 PM  Specimen: Nasal Mucosa; Nasal Swab Result Value Ref Range  MRSA by PCR Next Gen DETECTED (A) NOT DETECTED   Comment: (NOTE) The GeneXpert MRSA Assay (FDA approved for NASAL specimens only), is one component of a comprehensive MRSA colonization surveillance program. It is not intended to diagnose MRSA infection nor to guide or monitor treatment for MRSA infections. Test performance is not FDA approved in patients less than 58 years old. Performed at [facility], [address], [place], [place] [address]  Hemoglobin and hematocrit, blood     Status: Abnormal  Collection Time: 08/07/23  4:23 PM Result Value Ref Range  Hemoglobin 5.8 (LL) 12.0 - 15.0 g/dL   Comment: REPEATED TO VERIFY THIS CRITICAL RESULT HAS VERIFIED AND BEEN CALLED TO DOSTER,S RN BY GOLSON,M ON 08 25 2024 AT 2015, AND HAS BEEN READ BACK.    HCT 18.7 (L) 36.0 - 46.0 %   Comment: Performed at [facility], [address], [place], [place] [address] Type and screen [facility]     Status: None  Collection Time: 08/07/23  6:16 PM Result Value Ref Range  ABO/RH(D) A POS   Antibody Screen NEG   Sample Expiration 08/07/2023,2359   Unit Number [id number]   Blood Component Type RBC, LR IRR   Unit division 00   Status of Unit ISSUED,FINAL   Unit tag comment EMERGENCY RELEASE   Transfusion Status OK TO TRANSFUSE   Crossmatch  Result COMPATIBLE  BPAM RBC     Status: None  Collection Time: 08/07/23  6:16 PM Result Value Ref Range  ISSUE DATE / TIME 010272536644   Blood Product Unit Number [id number]   Unit Type and Rh 9500   Blood Product Expiration Date 564332951884   ISSUING PHYSICIAN [id number]^ELLISON^CHI^JANE  Prepare RBC     Status: None  Collection Time: 08/07/23  6:17 PM Result Value Ref Range  Order Confirmation     ORDER PROCESSED BY BLOOD BANK Performed at [facility], [address], [place], [place] [address]  Prepare platelet pheresis     Status: None  Collection Time: 08/07/23  6:23 PM Result Value Ref Range  Unit Number [id number]   Blood Component Type PSORALEN TREATED   Unit division 00   Status of Unit OUTDATED/DESTROYED   Transfusion Status OK TO TRANSFUSE   Unit Number [id number]   Blood Component Type PLTP2 PSORALEN TREATED   Unit division 00   Status of Unit ISSUED,FINAL   Transfusion Status     OK TO TRANSFUSE Performed at [facility], [address], [place], [place] [address]  BPAM Platelet Pheresis     Status: None  Collection Time: 08/07/23  6:23 PM Result  Value Ref Range  Blood Product Unit Number [id number]   PRODUCT CODE [id number]   Unit Type and Rh 6200   Blood Product Expiration Date 956213086578   ISSUE DATE / TIME 469629528413   Blood Product Unit Number [id number]   PRODUCT CODE [id number]   Unit Type and Rh 5100   Blood Product Expiration Date 595638756433  Type and screen     Status: None  Collection Time: 08/07/23  7:45 PM Result Value Ref Range  ABO/RH(D) A POS   Antibody Screen NEG   Sample Expiration 08/10/2023,2359   Unit Number [id number]   Blood Component Type RED CELLS,LR   Unit division 00   Status of Unit ISSUED,FINAL   Unit tag comment EMERGENCY RELEASE   Transfusion Status OK TO TRANSFUSE   Crossmatch Result COMPATIBLE   Unit Number [id number]   Blood Component Type RBC LR  PHER1   Unit division 00   Status of Unit ISSUED,FINAL   Transfusion Status OK TO TRANSFUSE   Crossmatch Result Compatible   Unit Number [id number]   Blood Component Type RED CELLS,LR   Unit division 00   Status of Unit ISSUED,FINAL   Transfusion Status OK TO TRANSFUSE   Crossmatch Result Compatible   Unit Number [id number]   Blood Component Type RED CELLS,LR   Unit division 00   Status of Unit REL FROM [facility]   Transfusion Status OK TO TRANSFUSE   Crossmatch Result Compatible   Unit Number [id number]   Blood Component Type RED CELLS,LR   Unit division 00   Status of Unit REL FROM [facility]   Transfusion Status OK TO TRANSFUSE   Crossmatch Result     Compatible Performed at [facility], [address], [place], [place] [address]  BPAM RBC     Status: None  Collection Time: 08/07/23  7:45 PM Result Value Ref Range  ISSUE DATE / TIME 500938182993   Blood Product Unit Number [id number]   Unit Type and Rh 9500   Blood Product Expiration Date 751025852778   ISSUING PHYSICIAN [id number]^PALIWAL^ADITYA   ISSUE DATE / TIME 242353614431   Blood Product Unit Number [id number]   PRODUCT CODE [id number]   Unit Type and Rh 6200   Blood Product Expiration Date 809983382505   ISSUING PHYSICIAN [facility]   ISSUE DATE / TIME 397673419379   Blood Product Unit Number [id number]   PRODUCT CODE E2683M19   Unit Type and Rh 6200   Blood Product Expiration Date 622297989211   ISSUING PHYSICIAN [facility]   Blood Product Unit Number [id number]   PRODUCT CODE H6314H70   Unit Type and Rh 6200   Blood Product Expiration Date 263785885027   ISSUING PHYSICIAN [facility]   Blood Product Unit Number [id number]   PRODUCT CODE [id number]   Unit Type and Rh 6200   Blood Product Expiration Date 366294765465   ISSUING PHYSICIAN ^PALIWAL^ADITYA  Prepare RBC (crossmatch)     Status: None  Collection Time: 08/07/23  7:50 PM Result Value Ref  Range  Order Confirmation     ORDER PROCESSED BY BLOOD BANK Performed at [facility], [address], [place], [place] [address]  Prepare RBC (crossmatch)     Status: None  Collection Time: 08/07/23  8:12 PM Result Value Ref Range  Order Confirmation     ORDER PROCESSED BY BLOOD BANK Performed at [facility], [address], [place], [place] [address]  Prepare RBC (crossmatch)     Status: None  Collection Time: 08/07/23  8:13  PM Result Value Ref Range  Order Confirmation     BB SAMPLE OR UNITS ALREADY AVAILABLE Performed at [facility], [address], [place], [place] [address]  Hemoglobin and hematocrit, blood     Status: Abnormal  Collection Time: 08/08/23  1:43 AM Result Value Ref Range  Hemoglobin 7.7 (L) 12.0 - 15.0 g/dL   Comment: REPEATED TO VERIFY POST TRANSFUSION SPECIMEN   HCT 24.1 (L) 36.0 - 46.0 %   Comment: Performed at [facility], [address], [place], [place] [address] Comprehensive metabolic panel     Status: Abnormal  Collection Time: 08/08/23  5:25 AM Result Value Ref Range  Sodium 137 135 - 145 mmol/L  Potassium 3.9 3.5 - 5.1 mmol/L  Chloride 114 (H) 98 - 111 mmol/L  CO2 20 (L) 22 - 32 mmol/L  Glucose, Bld 163 (H) 70 - 99 mg/dL   Comment: Glucose reference range applies only to samples taken after fasting for at least 8 hours.  BUN 26 (H) 8 - 23 mg/dL  Creatinine, Ser 8.29 5.62 - 1.00 mg/dL  Calcium 6.7 (L) 8.9 - 10.3 mg/dL  Total Protein 4.0 (L) 6.5 - 8.1 g/dL  Albumin 2.3 (L) 3.5 - 5.0 g/dL  AST 12 (L) 15 - 41 U/L  ALT 12 0 - 44 U/L  Alkaline Phosphatase 37 (L) 38 - 126 U/L  Total Bilirubin 0.6 0.3 - 1.2 mg/dL  GFR, Estimated >13 >08 mL/min   Comment: (NOTE) Calculated using the CKD-EPI Creatinine Equation (2021)   Anion gap 3 (L) 5 - 15   Comment: Performed at [facility], [address], [place], [place] [address] Magnesium     Status:  Abnormal  Collection Time: 08/08/23  5:25 AM Result Value Ref Range  Magnesium 1.6 (L) 1.7 - 2.4 mg/dL   Comment: Performed at [facility], [address], [place], [place] [address] Phosphorus     Status: None  Collection Time: 08/08/23  5:25 AM Result Value Ref Range  Phosphorus 3.8 2.5 - 4.6 mg/dL   Comment: Performed at [facility], [address] [place], [place] [address] CBC with Differential/Platelet     Status: Abnormal  Collection Time: 08/08/23  5:25 AM Result Value Ref Range  WBC 13.4 (H) 4.0 - 10.5 K/uL  RBC 2.49 (L) 3.87 - 5.11 MIL/uL  Hemoglobin 6.8 (LL) 12.0 - 15.0 g/dL   Comment: CRITICAL VALUE NOTED.  VALUE IS CONSISTENT WITH PREVIOUSLY REPORTED AND CALLED VALUE. REPEATED TO VERIFY THIS CRITICAL RESULT HAS VERIFIED AND BEEN CALLED TO Tonye Pearson, RN BY Kathlene November ON 08 26 2024 AT 0625, AND HAS BEEN READ BACK.    HCT 21.1 (L) 36.0 - 46.0 %  MCV 84.7 80.0 - 100.0 fL  MCH 27.3 26.0 - 34.0 pg  MCHC 32.2 30.0 - 36.0 g/dL  RDW 32.4 (H) 40.1 - 02.7 %  Platelets 201 150 - 400 K/uL  nRBC 0.0 0.0 - 0.2 %  Neutrophils Relative % 75 %  Neutro Abs 10.1 (H) 1.7 - 7.7 K/uL  Lymphocytes Relative 17 %  Lymphs Abs 2.3 0.7 - 4.0 K/uL  Monocytes Relative 6 %  Monocytes Absolute 0.8 0.1 - 1.0 K/uL  Eosinophils Relative 1 %  Eosinophils Absolute 0.2 0.0 - 0.5 K/uL  Basophils Relative 1 %  Basophils Absolute 0.1 0.0 - 0.1 K/uL  Immature Granulocytes 0 %  Abs Immature Granulocytes 0.05 0.00 - 0.07 K/uL   Comment: Performed at [facility], [address], [place], [place] [address] Prepare RBC (crossmatch)     Status: None  Collection Time: 08/08/23  7:58 AM Result Value Ref Range  Order Confirmation     ORDER PROCESSED BY BLOOD BANK Performed at [facility], [address], [place], [place] [address]  CBC     Status: Abnormal  Collection Time: 08/08/23 11:05 AM Result Value Ref  Range  WBC 9.9 4.0 - 10.5 K/uL  RBC 2.91 (L) 3.87 - 5.11 MIL/uL  Hemoglobin 8.2 (L) 12.0 - 15.0 g/dL  HCT 29.5 (L) 62.1 - 30.8 %  MCV 85.2 80.0 - 100.0 fL  MCH 28.2 26.0 - 34.0 pg  MCHC 33.1 30.0 - 36.0 g/dL  RDW 65.7 (H) 84.6 - 96.2 %  Platelets 159 150 - 400 K/uL  nRBC 0.0 0.0 - 0.2 %   Comment: Performed at [facility], [address], [place], [place] [address] CBC     Status: Abnormal  Collection Time: 08/08/23  5:05 PM Result Value Ref Range  WBC 10.4 4.0 - 10.5 K/uL  RBC 2.56 (L) 3.87 - 5.11 MIL/uL  Hemoglobin 7.1 (L) 12.0 - 15.0 g/dL  HCT 13.2 (L) 44.0 - 10.2 %  MCV 85.9 80.0 - 100.0 fL  MCH 27.7 26.0 - 34.0 pg  MCHC 32.3 30.0 - 36.0 g/dL  RDW 72.5 (H) 36.6 - 44.0 %  Platelets 152 150 - 400 K/uL  nRBC 0.0 0.0 - 0.2 %   Comment: Performed at [facility], [address], [place], [place] [address] Basic metabolic panel     Status: Abnormal  Collection Time: 08/08/23  5:05 PM Result Value Ref Range  Sodium 139 135 - 145 mmol/L  Potassium 3.7 3.5 - 5.1 mmol/L  Chloride 108 98 - 111 mmol/L  CO2 20 (L) 22 - 32 mmol/L  Glucose, Bld 124 (H) 70 - 99 mg/dL   Comment: Glucose reference range applies only to samples taken after fasting for at least 8 hours.  BUN 14 8 - 23 mg/dL  Creatinine, Ser 5.95 6.38 - 1.00 mg/dL  Calcium 7.7 (L) 8.9 - 10.3 mg/dL  GFR, Estimated >75 >64 mL/min   Comment: (NOTE) Calculated using the CKD-EPI Creatinine Equation (2021)   Anion gap 11 5 - 15   Comment: Performed at [facility], [address], [place], [place] [address] Prepare RBC (crossmatch)     Status: None  Collection Time: 08/08/23  7:08 PM Result Value Ref Range  Order Confirmation     ORDER PROCESSED BY BLOOD BANK Performed at [facility], [address] [place], [place] [address]  CBC     Status: Abnormal  Collection Time: 08/09/23  2:14 AM Result Value Ref Range  WBC 9.9 4.0 - 10.5  K/uL  RBC 3.13 (L) 3.87 - 5.11 MIL/uL  Hemoglobin 8.8 (L) 12.0 - 15.0 g/dL   Comment: REPEATED TO VERIFY POST TRANSFUSION SPECIMEN   HCT 26.8 (L) 36.0 - 46.0 %  MCV 85.6 80.0 - 100.0 fL  MCH 28.1 26.0 - 34.0 pg  MCHC 32.8 30.0 - 36.0 g/dL  RDW 66.0 (H) 63.0 - 16.0 %  Platelets 152 150 - 400 K/uL  nRBC 0.0 0.0 - 0.2 %   Comment: Performed at [facility], [address], [place], [place] [address] Basic metabolic panel     Status: Abnormal  Collection Time: 08/09/23  6:11 AM Result Value Ref Range  Sodium 134 (  L) 135 - 145 mmol/L  Potassium 3.8 3.5 - 5.1 mmol/L  Chloride 105 98 - 111 mmol/L  CO2 21 (L) 22 - 32 mmol/L  Glucose, Bld 95 70 - 99 mg/dL   Comment: Glucose reference range applies only to samples taken after fasting for at least 8 hours.  BUN 11 8 - 23 mg/dL  Creatinine, Ser 4.69 6.29 - 1.00 mg/dL  Calcium 7.9 (L) 8.9 - 10.3 mg/dL  GFR, Estimated >52 >84 mL/min   Comment: (NOTE) Calculated using the CKD-EPI Creatinine Equation (2021)   Anion gap 8 5 - 15   Comment: Performed at [facility], [address], [place], [place] [address] CBC     Status: Abnormal  Collection Time: 08/09/23  6:11 AM Result Value Ref Range  WBC 8.7 4.0 - 10.5 K/uL  RBC 3.09 (L) 3.87 - 5.11 MIL/uL  Hemoglobin 8.9 (L) 12.0 - 15.0 g/dL  HCT 01.0 (L) 27.2 - 53.6 %  MCV 86.4 80.0 - 100.0 fL  MCH 28.8 26.0 - 34.0 pg  MCHC 33.3 30.0 - 36.0 g/dL  RDW 64.4 (H) 03.4 - 74.2 %  Platelets 154 150 - 400 K/uL  nRBC 0.0 0.0 - 0.2 %   Comment: Performed at [facility], [address], [place], [place] [address] Magnesium     Status: None  Collection Time: 08/09/23  6:11 AM Result Value Ref Range  Magnesium 2.0 1.7 - 2.4 mg/dL   Comment: Performed at [facility], [address] [place], [place] [address] CBC     Status: Abnormal  Collection Time: 08/09/23  9:22 AM Result Value Ref Range  WBC 8.7 4.0 - 10.5  K/uL  RBC 3.32 (L) 3.87 - 5.11 MIL/uL  Hemoglobin 9.6 (L) 12.0 - 15.0 g/dL  HCT 33.2 (L) 95.1 - 88.4 %  MCV 85.8 80.0 - 100.0 fL  MCH 28.9 26.0 - 34.0 pg  MCHC 33.7 30.0 - 36.0 g/dL  RDW 16.6 (H) 06.3 - 01.6 %  Platelets 172 150 - 400 K/uL  nRBC 0.0 0.0 - 0.2 %   Comment: Performed at [facility], [address], [place], [place] [address] CBC with Differential/Platelet     Status: Abnormal  Collection Time: 08/09/23  6:10 PM Result Value Ref Range  WBC 7.9 4.0 - 10.5 K/uL  RBC 3.29 (L) 3.87 - 5.11 MIL/uL  Hemoglobin 9.3 (L) 12.0 - 15.0 g/dL  HCT 23.5 (L) 57.3 - 22.0 %  MCV 85.4 80.0 - 100.0 fL  MCH 28.3 26.0 - 34.0 pg  MCHC 33.1 30.0 - 36.0 g/dL  RDW 25.4 (H) 27.0 - 62.3 %  Platelets 174 150 - 400 K/uL  nRBC 0.0 0.0 - 0.2 %  Neutrophils Relative % 67 %  Neutro Abs 5.3 1.7 - 7.7 K/uL  Lymphocytes Relative 22 %  Lymphs Abs 1.7 0.7 - 4.0 K/uL  Monocytes Relative 7 %  Monocytes Absolute 0.6 0.1 - 1.0 K/uL  Eosinophils Relative 3 %  Eosinophils Absolute 0.2 0.0 - 0.5 K/uL  Basophils Relative 1 %  Basophils Absolute 0.0 0.0 - 0.1 K/uL  Immature Granulocytes 0 %  Abs Immature Granulocytes 0.03 0.00 - 0.07 K/uL   Comment: Performed at [facility], [address] [place], [place] [address] Hemoglobin A1c     Status: Abnormal  Collection Time: 08/09/23  6:10 PM Result Value Ref Range  Hgb A1c MFr Bld 5.8 (H) 4.8 - 5.6 %   Comment: (NOTE) Pre  diabetes:          5.7%-6.4%  Diabetes:              >6.4%  Glycemic control for   <7.0% adults with diabetes   Mean Plasma Glucose 119.76 mg/dL   Comment: Performed at [facility], [address], [place], [place] [address] Glucose, capillary     Status: None  Collection Time: 08/09/23  6:18 PM Result Value Ref Range  Glucose-Capillary 88 70 - 99 mg/dL   Comment: Glucose reference range applies only to samples taken after fasting for at least 8 hours. Glucose, capillary     Status:  Abnormal  Collection Time: 08/09/23  9:20 PM Result Value Ref Range  Glucose-Capillary 101 (H) 70 - 99 mg/dL   Comment: Glucose reference range applies only to samples taken after fasting for at least 8 hours.  Comment 1 Notify RN   Comment 2 Document in Chart  CBC with Differential/Platelet     Status: Abnormal  Collection Time: 08/10/23  4:11 AM Result Value Ref Range  WBC 8.8 4.0 - 10.5 K/uL  RBC 3.25 (L) 3.87 - 5.11 MIL/uL  Hemoglobin 9.1 (L) 12.0 - 15.0 g/dL  HCT 56.2 (L) 13.0 - 86.5 %  MCV 86.2 80.0 - 100.0 fL  MCH 28.0 26.0 - 34.0 pg  MCHC 32.5 30.0 - 36.0 g/dL  RDW 78.4 (H) 69.6 - 29.5 %  Platelets 174 150 - 400 K/uL  nRBC 0.0 0.0 - 0.2 %  Neutrophils Relative % 71 %  Neutro Abs 6.4 1.7 - 7.7 K/uL  Lymphocytes Relative 17 %  Lymphs Abs 1.5 0.7 - 4.0 K/uL  Monocytes Relative 8 %  Monocytes Absolute 0.7 0.1 - 1.0 K/uL  Eosinophils Relative 3 %  Eosinophils Absolute 0.3 0.0 - 0.5 K/uL  Basophils Relative 1 %  Basophils Absolute 0.0 0.0 - 0.1 K/uL  Immature Granulocytes 0 %  Abs Immature Granulocytes 0.03 0.00 - 0.07 K/uL   Comment: Performed at [facility], [address], [place], [place] [address] Basic metabolic panel     Status: Abnormal  Collection Time: 08/10/23  4:11 AM Result Value Ref Range  Sodium 139 135 - 145 mmol/L  Potassium 3.8 3.5 - 5.1 mmol/L  Chloride 106 98 - 111 mmol/L  CO2 25 22 - 32 mmol/L  Glucose, Bld 103 (H) 70 - 99 mg/dL   Comment: Glucose reference range applies only to samples taken after fasting for at least 8 hours.  BUN 10 8 - 23 mg/dL  Creatinine, Ser 2.44 0.10 - 1.00 mg/dL  Calcium 8.5 (L) 8.9 - 10.3 mg/dL  GFR, Estimated >27 >25 mL/min   Comment: (NOTE) Calculated using the CKD-EPI Creatinine Equation (2021)   Anion gap 8 5 - 15   Comment: Performed at [facility], [address], [place], [place] [address] Glucose, capillary     Status: Abnormal  Collection Time: 08/10/23  7:25  AM Result Value Ref Range  Glucose-Capillary 106 (H) 70 - 99 mg/dL   Comment: Glucose reference range applies only to samples taken after fasting for at least 8 hours. Glucose, capillary     Status: None  Collection Time: 08/10/23 11:48 AM Result Value Ref Range  Glucose-Capillary 98 70 - 99 mg/dL   Comment: Glucose reference range applies only to samples taken after fasting for at least 8 hours.  Comment 1 Notify RN   Comment 2 Document in Chart  Glucose, capillary  Status: None  Collection Time: 08/10/23  6:22 PM Result Value Ref Range  Glucose-Capillary 94 70 - 99 mg/dL   Comment: Glucose reference range applies only to samples taken after fasting for at least 8 hours. Glucose, capillary     Status: Abnormal  Collection Time: 08/10/23  9:54 PM Result Value Ref Range  Glucose-Capillary 124 (H) 70 - 99 mg/dL   Comment: Glucose reference range applies only to samples taken after fasting for at least 8 hours. Glucose, capillary     Status: Abnormal  Collection Time: 08/11/23  7:26 AM Result Value Ref Range  Glucose-Capillary 119 (H) 70 - 99 mg/dL   Comment: Glucose reference range applies only to samples taken after fasting for at least 8 hours. CBC with Differential/Platelet     Status: Abnormal  Collection Time: 08/11/23 10:05 AM Result Value Ref Range  WBC 5.5 4.0 - 10.5 K/uL  RBC 3.32 (L) 3.87 - 5.11 MIL/uL  Hemoglobin 9.6 (L) 12.0 - 15.0 g/dL  HCT 19.1 (L) 47.8 - 29.5 %  MCV 88.3 80.0 - 100.0 fL  MCH 28.9 26.0 - 34.0 pg  MCHC 32.8 30.0 - 36.0 g/dL  RDW 62.1 (H) 30.8 - 65.7 %  Platelets 206 150 - 400 K/uL  nRBC 0.0 0.0 - 0.2 %  Neutrophils Relative % 70 %  Neutro Abs 3.9 1.7 - 7.7 K/uL  Lymphocytes Relative 19 %  Lymphs Abs 1.0 0.7 - 4.0 K/uL  Monocytes Relative 8 %  Monocytes Absolute 0.4 0.1 - 1.0 K/uL  Eosinophils Relative 2 %  Eosinophils Absolute 0.1 0.0 - 0.5 K/uL  Basophils Relative 1 %  Basophils Absolute 0.0 0.0 - 0.1 K/uL  Immature  Granulocytes 0 %  Abs Immature Granulocytes 0.02 0.00 - 0.07 K/uL   Comment: Performed at [facility], [address], [place], [place] [address] Basic metabolic panel     Status: Abnormal  Collection Time: 08/11/23 10:05 AM Result Value Ref Range  Sodium 138 135 - 145 mmol/L  Potassium 4.1 3.5 - 5.1 mmol/L  Chloride 104 98 - 111 mmol/L  CO2 26 22 - 32 mmol/L  Glucose, Bld 177 (H) 70 - 99 mg/dL   Comment: Glucose reference range applies only to samples taken after fasting for at least 8 hours.  BUN 10 8 - 23 mg/dL  Creatinine, Ser 2.95 2.84 - 1.00 mg/dL  Calcium 7.6 (L) 8.9 - 10.3 mg/dL  GFR, Estimated >13 >24 mL/min   Comment: (NOTE) Calculated using the CKD-EPI Creatinine Equation (2021)   Anion gap 8 5 - 15   Comment: Performed at [facility], [address], [place], [place] [address] Glucose, capillary     Status: Abnormal  Collection Time: 08/11/23 11:50 AM Result Value Ref Range  Glucose-Capillary 135 (H) 70 - 99 mg/dL   Comment: Glucose reference range applies only to samples taken after fasting for at least 8 hours. Comprehensive metabolic panel     Status: Abnormal  Collection Time: 09/09/23  5:39 AM Result Value Ref Range  Sodium 137 135 - 145 mmol/L  Potassium 4.4 3.5 - 5.1 mmol/L  Chloride 102 98 - 111 mmol/L  CO2 17 (L) 22 - 32 mmol/L  Glucose, Bld 178 (H) 70 - 99 mg/dL   Comment: Glucose reference range applies only to samples taken after fasting for at least 8 hours.  BUN 23 8 - 23 mg/dL  Creatinine, Ser 7.25 (H) 0.44 - 1.00 mg/dL  Calcium 8.5 (L) 8.9 - 10.3 mg/dL  Total Protein 5.4 (L) 6.5 - 8.1 g/dL  Albumin 3.0 (L) 3.5 - 5.0 g/dL  AST 20 15 - 41 U/L  ALT 14 0 - 44 U/L  Alkaline Phosphatase 63 38 - 126 U/L  Total Bilirubin 0.3 0.3 - 1.2 mg/dL  GFR, Estimated 49 (L) >60 mL/min   Comment: (NOTE) Calculated using the CKD-EPI Creatinine Equation (2021)   Anion gap 18 (H) 5 - 15   Comment: Performed at [facility], [address] [place], [place] [address] Lipase, blood     Status: None  Collection Time: 09/09/23  5:39 AM Result Value Ref Range  Lipase 42 11 - 51 U/L   Comment: Performed at [facility], [address], [place], [place] [address] Troponin I (High Sensitivity)     Status: None  Collection Time: 09/09/23  5:39 AM Result Value Ref Range  Troponin I (High Sensitivity) 6 <18 ng/L   Comment: (NOTE) Elevated high sensitivity troponin I (hsTnI) values and significant  changes across serial measurements may suggest ACS but many other  chronic and acute conditions are known to elevate hsTnI results.  Refer to the "Links" section for chest pain algorithms and additional  guidance. Performed at [facility], [address] [place], [place] [address]  CBC with Differential     Status: Abnormal  Collection Time: 09/09/23  5:39 AM Result Value Ref Range  WBC 13.9 (H) 4.0 - 10.5 K/uL  RBC 3.49 (L) 3.87 - 5.11 MIL/uL  Hemoglobin 9.8 (L) 12.0 - 15.0 g/dL  HCT 91.4 (L) 78.2 - 95.6 %  MCV 94.6 80.0 - 100.0 fL  MCH 28.1 26.0 - 34.0 pg  MCHC 29.7 (L) 30.0 - 36.0 g/dL  RDW 21.3 (H) 08.6 - 57.8 %  Platelets 356 150 - 400 K/uL  nRBC 0.0 0.0 - 0.2 %  Neutrophils Relative % 82 %  Neutro Abs 11.4 (H) 1.7 - 7.7 K/uL  Lymphocytes Relative 10 %  Lymphs Abs 1.4 0.7 - 4.0 K/uL  Monocytes Relative 7 %  Monocytes Absolute 0.9 0.1 - 1.0 K/uL  Eosinophils Relative 0 %  Eosinophils Absolute 0.0 0.0 - 0.5 K/uL  Basophils Relative 0 %  Basophils Absolute 0.1 0.0 - 0.1 K/uL  Immature Granulocytes 1 %  Abs Immature Granulocytes 0.17 (H) 0.00 - 0.07 K/uL   Comment: Performed at [facility], [address] [place], [place] [address] Ethanol     Status: None  Collection Time: 09/09/23  5:39 AM Result Value Ref Range  Alcohol, Ethyl (B) <10 <10 mg/dL   Comment: (NOTE) Lowest detectable limit for serum alcohol is 10 mg/dL.  For medical purposes only. Performed at [facility], [address] [place], [place] [address]  Protime-INR     Status: None  Collection Time: 09/09/23  5:39 AM Result Value Ref Range  Prothrombin Time 13.1 11.4 - 15.2 seconds  INR 1.0 0.8 - 1.2   Comment: (NOTE) INR goal varies based on device and disease states. Performed at [facility], [address] [place], [place] [address]  APTT     Status: Abnormal  Collection Time: 09/09/23  5:39 AM Result Value Ref Range  aPTT 22 (L) 24 - 36 seconds   Comment: Performed at [facility], [address] [place], [place] [address] Lipid panel     Status: Abnormal  Collection Time: 09/09/23  5:39 AM Result Value Ref Range  Cholesterol 214 (H)  0 - 200 mg/dL  Triglycerides 732 (H) <150 mg/dL  HDL 37 (L) >20 mg/dL  Total CHOL/HDL Ratio 5.8 RATIO  VLDL 60 (H) 0 - 40 mg/dL  LDL Cholesterol 254 (H) 0 - 99 mg/dL   Comment:        Total Cholesterol/HDL:CHD Risk Coronary Heart Disease Risk Table                     Men   Women  1/2 Average Risk   3.4   3.3  Average Risk       5.0   4.4  2 X Average Risk   9.6   7.1  3 X Average Risk  23.4   11.0        Use the calculated Patient Ratio above and the CHD Risk Table to determine the patient's CHD Risk.        ATP III CLASSIFICATION (LDL):  <100     mg/dL   Optimal  270-623  mg/dL   Near or Above                    Optimal  130-159  mg/dL   Borderline  762-831  mg/dL   High  >517     mg/dL   Very High Performed at [facility], [address] [place], [place] [address]  Hemoglobin A1c     Status: Abnormal  Collection Time: 09/09/23  5:39 AM Result Value Ref Range  Hgb A1c MFr Bld 5.7 (H) 4.8 - 5.6 %   Comment: (NOTE) Pre diabetes:          5.7%-6.4%  Diabetes:              >6.4%  Glycemic control for   <7.0% adults with diabetes   Mean Plasma Glucose 116.89 mg/dL   Comment: Performed at [facility], [address], [place], [place] [address] I-stat chem 8, ED     Status:  Abnormal  Collection Time: 09/09/23  5:43 AM Result Value Ref Range  Sodium 135 135 - 145 mmol/L  Potassium 4.6 3.5 - 5.1 mmol/L  Chloride 106 98 - 111 mmol/L  BUN 23 8 - 23 mg/dL  Creatinine, Ser 2.69 (H) 0.44 - 1.00 mg/dL  Glucose, Bld 485 (H) 70 - 99 mg/dL   Comment: Glucose reference range applies only to samples taken after fasting for at least 8 hours.  Calcium, Ion 0.98 (L) 1.15 - 1.40 mmol/L  TCO2 18 (L) 22 - 32 mmol/L  Hemoglobin 10.5 (L) 12.0 - 15.0 g/dL  HCT 46.2 (L) 70.3 - 50.0 % Troponin I (High Sensitivity)     Status: None  Collection Time: 09/09/23  6:45 AM Result Value Ref Range  Troponin I (High Sensitivity) 4 <18 ng/L   Comment: (NOTE) Elevated high sensitivity troponin I (hsTnI) values and significant  changes across serial measurements may suggest ACS but many other  chronic and acute conditions are known to elevate hsTnI results.  Refer to the "Links" section for chest pain algorithms and additional  guidance. Performed at [facility], [address], [place], [place] [address]  Urinalysis, w/ Reflex to Culture (Infection Suspected) -Urine, Clean Catch     Status: Abnormal  Collection Time: 09/09/23  6:30 PM Result Value Ref Range  Specimen Source URINE, CLEAN CATCH   Color, Urine YELLOW YELLOW  APPearance HAZY (A) CLEAR  Specific Gravity, Urine 1.035 (H) 1.005 - 1.030  pH 5.0  5.0 - 8.0  Glucose, UA >=500 (A) NEGATIVE mg/dL  Hgb urine dipstick MODERATE (A) NEGATIVE  Bilirubin Urine NEGATIVE NEGATIVE  Ketones, ur NEGATIVE NEGATIVE mg/dL  Protein, ur NEGATIVE NEGATIVE mg/dL  Nitrite NEGATIVE NEGATIVE  Leukocytes,Ua LARGE (A) NEGATIVE  RBC / HPF 21-50 0 - 5 RBC/hpf  WBC, UA >50 0 - 5 WBC/hpf   Comment:        Reflex urine culture not performed if WBC <=10, OR if Squamous epithelial cells >5. If Squamous epithelial cells >5 suggest recollection.   Bacteria, UA NONE SEEN NONE SEEN  Squamous Epithelial / HPF 0-5 0 - 5  /HPF  Mucus PRESENT    Comment: Performed at [facility], [address], [place], [place] [address] Urine rapid drug screen (hosp performed)     Status: Abnormal  Collection Time: 09/09/23  6:30 PM Result Value Ref Range  Opiates POSITIVE (A) NONE DETECTED  Cocaine NONE DETECTED NONE DETECTED  Benzodiazepines POSITIVE (A) NONE DETECTED  Amphetamines POSITIVE (A) NONE DETECTED  Tetrahydrocannabinol POSITIVE (A) NONE DETECTED  Barbiturates NONE DETECTED NONE DETECTED   Comment: (NOTE) DRUG SCREEN FOR MEDICAL PURPOSES ONLY.  IF CONFIRMATION IS NEEDED FOR ANY PURPOSE, NOTIFY LAB WITHIN 5 DAYS.  LOWEST DETECTABLE LIMITS FOR URINE DRUG SCREEN Drug Class                     Cutoff (ng/mL) Amphetamine and metabolites    1000 Barbiturate and metabolites    200 Benzodiazepine                 200 Opiates and metabolites        300 Cocaine and metabolites        300 THC                            50 Performed at [facility], [address] [place], [place] [address]  Urine Culture     Status: Abnormal  Collection Time: 09/09/23  6:30 PM  Specimen: Urine, Clean Catch Result Value Ref Range  Specimen Description URINE, CLEAN CATCH   Special Requests     NONE Reflexed from [phone] Performed at [facility], [address], [place], [place] [address]   Culture >=100,000 COLONIES/mL KLEBSIELLA OXYTOCA (A)   Report Status 09/11/2023 FINAL   Organism ID, Bacteria KLEBSIELLA OXYTOCA (A)      Susceptibility  Klebsiella oxytoca - MIC*   AMPICILLIN >=32 RESISTANT Resistant    CEFEPIME <=0.12 SENSITIVE Sensitive    CEFTRIAXONE <=0.25 SENSITIVE Sensitive    CIPROFLOXACIN <=0.25 SENSITIVE Sensitive    GENTAMICIN <=1 SENSITIVE Sensitive    IMIPENEM <=0.25 SENSITIVE Sensitive    NITROFURANTOIN 32 SENSITIVE Sensitive    TRIMETH/SULFA <=20 SENSITIVE Sensitive    AMPICILLIN/SULBACTAM 8 SENSITIVE Sensitive    PIP/TAZO <=4 SENSITIVE Sensitive    * >=100,000 COLONIES/mL  KLEBSIELLA OXYTOCA Lactic acid, plasma     Status: None  Collection Time: 09/09/23  7:07 PM Result Value Ref Range  Lactic Acid, Venous 0.8 0.5 - 1.9 mmol/L   Comment: Performed at [facility], [address], [place], [place] [address] Procalcitonin     Status: None  Collection Time: 09/09/23  7:07 PM Result Value Ref Range  Procalcitonin 0.40 ng/mL   Comment:        Interpretation: PCT (Procalcitonin) <= 0.5 ng/mL: Systemic infection (sepsis) is not likely. Local bacterial infection is  possible. (NOTE)       Sepsis PCT Algorithm           Lower Respiratory Tract                                      Infection PCT Algorithm    ----------------------------     ----------------------------         PCT < 0.25 ng/mL                PCT < 0.10 ng/mL          Strongly encourage             Strongly discourage   discontinuation of antibiotics    initiation of antibiotics    ----------------------------     -----------------------------       PCT 0.25 - 0.50 ng/mL            PCT 0.10 - 0.25 ng/mL               OR       >80% decrease in PCT            Discourage initiation of                                            antibiotics      Encourage discontinuation           of antibiotics    ----------------------------     -----------------------------         PCT >= 0.50 ng/mL              PCT 0.26 - 0.50 ng/mL               AND        <80% decrease in PCT             Encourage initiation of                                             antibiotics       Encourage continuation           of antibiotics    ----------------------------     -----------------------------        PCT >= 0.50 ng/mL                  PCT > 0.50 ng/mL               AND         increase in PCT                  Strongly encourage                                      initiation of antibiotics    Strongly encourage escalation           of antibiotics                                      -----------------------------  PCT <= 0.25 ng/mL                                                 OR                                        > 80% decrease in PCT                                      Discontinue / Do not initiate                                             antibiotics  Performed at [facility], [address] [place], [place] [address]  Sedimentation rate     Status: None  Collection Time: 09/09/23  7:07 PM Result Value Ref Range  Sed Rate 22 0 - 22 mm/hr   Comment: Performed at [facility], [address], [place], [place] [address] C-reactive protein     Status: Abnormal  Collection Time: 09/09/23  7:07 PM Result Value Ref Range  CRP 2.0 (H) <1.0 mg/dL   Comment: Performed at [facility], [address], [place], [place] [address] MRSA Next Gen by PCR, Nasal     Status: Abnormal  Collection Time: 09/09/23  8:05 PM  Specimen: Nasal Mucosa; Nasal Swab Result Value Ref Range  MRSA by PCR Next Gen DETECTED (A) NOT DETECTED   Comment: RESULT CALLED TO, READ BACK BY AND VERIFIED WITH: F OKASOR,[email or web address]@[id number]  09/10/23 MK (NOTE) The GeneXpert MRSA Assay (FDA approved for NASAL specimens only), is one component of a comprehensive MRSA colonization surveillance program. It is not intended to diagnose MRSA infection nor to guide or monitor treatment for MRSA infections. Test performance is not FDA approved in patients less than 41 years old. Performed at [facility], [address] [place], [place] [address]  Lactic acid, plasma     Status: None  Collection Time: 09/09/23  9:14 PM Result Value Ref Range  Lactic Acid, Venous 1.5 0.5 - 1.9 mmol/L   Comment: Performed at [facility], [address] [place], [place] [address] Glucose, capillary     Status: Abnormal  Collection Time: 09/09/23  9:43 PM Result Value Ref Range  Glucose-Capillary 119 (H) 70 - 99 mg/dL   Comment: Glucose reference range  applies only to samples taken after fasting for at least 8 hours.  Comment 1 Notify RN   Comment 2 Document in Chart  Glucose, capillary     Status: Abnormal  Collection Time: 09/10/23  6:04 AM Result Value Ref Range  Glucose-Capillary 115 (H) 70 - 99 mg/dL   Comment: Glucose reference range applies only to samples taken after fasting for at least 8 hours.  Comment 1 Notify RN  Procalcitonin     Status: None  Collection Time: 09/10/23 10:39 AM Result Value Ref Range  Procalcitonin 0.34 ng/mL   Comment:        Interpretation: PCT (Procalcitonin) <= 0.5 ng/mL: Systemic infection (sepsis) is not  likely. Local bacterial infection is possible. (NOTE)       Sepsis PCT Algorithm           Lower Respiratory Tract                                      Infection PCT Algorithm    ----------------------------     ----------------------------         PCT < 0.25 ng/mL                PCT < 0.10 ng/mL          Strongly encourage             Strongly discourage   discontinuation of antibiotics    initiation of antibiotics    ----------------------------     -----------------------------       PCT 0.25 - 0.50 ng/mL            PCT 0.10 - 0.25 ng/mL               OR       >80% decrease in PCT            Discourage initiation of                                            antibiotics      Encourage discontinuation           of antibiotics    ----------------------------     -----------------------------         PCT >= 0.50 ng/mL              PCT 0.26 - 0.50 ng/mL               AND        <80% decrease in PCT             Encourage initiation of                                             antibiotics       Encourage continuation           of antibiotics    ----------------------------     -----------------------------        PCT >= 0.50 ng/mL                  PCT > 0.50 ng/mL               AND         increase in PCT                  Strongly encourage                                       initiation of antibiotics    Strongly encourage escalation           of antibiotics                                     -----------------------------  PCT <= 0.25 ng/mL                                                 OR                                        > 80% decrease in PCT                                      Discontinue / Do not initiate                                             antibiotics  Performed at [facility], [address] [place], [place] [address]  Glucose, capillary     Status: Abnormal  Collection Time: 09/10/23 11:19 AM Result Value Ref Range  Glucose-Capillary 126 (H) 70 - 99 mg/dL   Comment: Glucose reference range applies only to samples taken after fasting for at least 8 hours.  Comment 1 Notify RN  ECHOCARDIOGRAM COMPLETE BUBBLE STUDY     Status: None  Collection Time: 09/10/23  3:25 PM Result Value Ref Range  S' Lateral 2.90 cm  AR max vel 2.25 cm2  AV Area VTI 2.14 cm2  AV Mean grad 4.0 mmHg  AV Peak grad 7.6 mmHg  Ao pk vel 1.38 m/s  Area-P 1/2 2.12 cm2  AV Area mean vel 2.26 cm2  Est EF 60 - 65%  Glucose, capillary     Status: Abnormal  Collection Time: 09/10/23  3:41 PM Result Value Ref Range  Glucose-Capillary 108 (H) 70 - 99 mg/dL   Comment: Glucose reference range applies only to samples taken after fasting for at least 8 hours. Glucose, capillary     Status: Abnormal  Collection Time: 09/10/23  9:04 PM Result Value Ref Range  Glucose-Capillary 108 (H) 70 - 99 mg/dL   Comment: Glucose reference range applies only to samples taken after fasting for at least 8 hours.  Comment 1 Notify RN   Comment 2 Document in Chart  Glucose, capillary     Status: Abnormal  Collection Time: 09/11/23  6:09 AM Result Value Ref Range  Glucose-Capillary 128 (H) 70 - 99 mg/dL   Comment: Glucose reference range applies only to samples taken after fasting for at least 8 hours.  Comment 1 Notify RN   Comment  2 Document in Chart  Glucose, capillary     Status: Abnormal  Collection Time: 09/11/23 10:20 AM Result Value Ref Range  Glucose-Capillary 142 (H) 70 - 99 mg/dL   Comment: Glucose reference range applies only to samples taken after fasting for at least 8 hours. CBC     Status: Abnormal  Collection Time: 09/11/23 11:34 AM Result Value Ref Range  WBC 5.6 4.0 - 10.5 K/uL  RBC 3.34 (L) 3.87 - 5.11 MIL/uL  Hemoglobin 9.3 (L) 12.0 - 15.0 g/dL  HCT 02.7 (L) 25.3 - 66.4 %  MCV 88.6 80.0 - 100.0 fL  MCH 27.8 26.0 - 34.0 pg  MCHC 31.4 30.0 - 36.0 g/dL  RDW 18.6 (H) 11.5 - 15.5 %  Platelets 285 150 - 400 K/uL  nRBC 0.0 0.0 - 0.2 %   Comment: Performed at [facility], [address], [place], [place] [address] Comprehensive metabolic panel     Status: Abnormal  Collection Time: 09/11/23 11:34 AM Result Value Ref Range  Sodium 137 135 - 145 mmol/L  Potassium 3.9 3.5 - 5.1 mmol/L  Chloride 97 (L) 98 - 111 mmol/L  CO2 22 22 - 32 mmol/L  Glucose, Bld 126 (H) 70 - 99 mg/dL   Comment: Glucose reference range applies only to samples taken after fasting for at least 8 hours.  BUN 8 8 - 23 mg/dL  Creatinine, Ser 6.04 5.40 - 1.00 mg/dL  Calcium 8.7 (L) 8.9 - 10.3 mg/dL  Total Protein 5.4 (L) 6.5 - 8.1 g/dL  Albumin 2.6 (L) 3.5 - 5.0 g/dL  AST 17 15 - 41 U/L  ALT 13 0 - 44 U/L  Alkaline Phosphatase 68 38 - 126 U/L  Total Bilirubin 0.5 0.3 - 1.2 mg/dL  GFR, Estimated >98 >11 mL/min   Comment: (NOTE) Calculated using the CKD-EPI Creatinine Equation (2021)   Anion gap 18 (H) 5 - 15   Comment: Performed at [facility], [address] [place], [place] [address] Glucose, capillary     Status: Abnormal  Collection Time: 09/11/23  3:37 PM Result Value Ref Range  Glucose-Capillary 132 (H) 70 - 99 mg/dL   Comment: Glucose reference range applies only to samples taken after fasting for at least 8 hours. Glucose, capillary     Status: Abnormal  Collection Time: 09/12/23  6:38  AM Result Value Ref Range  Glucose-Capillary 127 (H) 70 - 99 mg/dL   Comment: Glucose reference range applies only to samples taken after fasting for at least 8 hours. CBC     Status: Abnormal  Collection Time: 09/12/23  7:46 AM Result Value Ref Range  WBC 5.0 4.0 - 10.5 K/uL  RBC 3.74 (L) 3.87 - 5.11 MIL/uL  Hemoglobin 10.2 (L) 12.0 - 15.0 g/dL  HCT 29.5 (L) 62.1 - 30.8 %  MCV 86.1 80.0 - 100.0 fL  MCH 27.3 26.0 - 34.0 pg  MCHC 31.7 30.0 - 36.0 g/dL  RDW 65.7 (H) 84.6 - 96.2 %  Platelets 309 150 - 400 K/uL  nRBC 0.0 0.0 - 0.2 %   Comment: Performed at [facility], [address] [place], [place] [address] Comprehensive metabolic panel     Status: Abnormal  Collection Time: 09/12/23  7:46 AM Result Value Ref Range  Sodium 139 135 - 145 mmol/L  Potassium 3.4 (L) 3.5 - 5.1 mmol/L  Chloride 101 98 - 111 mmol/L  CO2 21 (L) 22 - 32 mmol/L  Glucose, Bld 131 (H) 70 - 99 mg/dL   Comment: Glucose reference range applies only to samples taken after fasting for at least 8 hours.  BUN 8 8 - 23 mg/dL  Creatinine, Ser 1.32 4.40 - 1.00 mg/dL  Calcium 9.0 8.9 - 10.2 mg/dL  Total Protein 5.9 (L) 6.5 - 8.1 g/dL  Albumin 2.8 (L) 3.5 - 5.0 g/dL  AST 17 15 - 41 U/L  ALT 18 0 - 44 U/L  Alkaline Phosphatase 71 38 - 126 U/L  Total Bilirubin 0.8 0.3 - 1.2 mg/dL  GFR, Estimated >72 >53 mL/min   Comment: (NOTE) Calculated using the CKD-EPI Creatinine Equation (2021)   Anion gap 17 (H) 5 - 15   Comment: Performed at  [facility], [address] [place], [place], [place] [address] Glucose, capillary     Status: Abnormal  Collection Time: 09/12/23 12:24 PM Result Value Ref Range  Glucose-Capillary 101 (H) 70 - 99 mg/dL   Comment: Glucose reference range applies only to samples taken after fasting for at least 8 hours. Glucose, capillary     Status: None  Collection Time: 09/12/23  4:46 PM Result Value Ref Range  Glucose-Capillary 90 70 - 99 mg/dL   Comment: Glucose reference range applies  only to samples taken after fasting for at least 8 hours. Glucose, capillary     Status: Abnormal  Collection Time: 09/12/23  9:13 PM Result Value Ref Range  Glucose-Capillary 109 (H) 70 - 99 mg/dL   Comment: Glucose reference range applies only to samples taken after fasting for at least 8 hours.  Comment 1 Notify RN   Comment 2 Document in Chart  Basic metabolic panel     Status: Abnormal  Collection Time: 09/13/23  6:23 AM Result Value Ref Range  Sodium 138 135 - 145 mmol/L  Potassium 4.5 3.5 - 5.1 mmol/L  Chloride 107 98 - 111 mmol/L  CO2 25 22 - 32 mmol/L  Glucose, Bld 105 (H) 70 - 99 mg/dL   Comment: Glucose reference range applies only to samples taken after fasting for at least 8 hours.  BUN 6 (L) 8 - 23 mg/dL  Creatinine, Ser 8.65 7.84 - 1.00 mg/dL  Calcium 8.9 8.9 - 69.6 mg/dL  GFR, Estimated >29 >52 mL/min   Comment: (NOTE) Calculated using the CKD-EPI Creatinine Equation (2021)   Anion gap 6 5 - 15   Comment: Performed at [facility], [address] [place], [place] [address] CBC     Status: Abnormal  Collection Time: 09/13/23  6:23 AM Result Value Ref Range  WBC 4.3 4.0 - 10.5 K/uL  RBC 3.54 (L) 3.87 - 5.11 MIL/uL  Hemoglobin 9.6 (L) 12.0 - 15.0 g/dL  HCT 44.0 (L) 10.2 - 72.5 %  MCV 88.1 80.0 - 100.0 fL  MCH 27.1 26.0 - 34.0 pg  MCHC 30.8 30.0 - 36.0 g/dL  RDW 36.6 (H) 44.0 - 34.7 %  Platelets 295 150 - 400 K/uL  nRBC 0.0 0.0 - 0.2 %   Comment: Performed at [facility], [address] [place], [place] [address] Glucose, capillary     Status: Abnormal  Collection Time: 09/13/23  6:36 AM Result Value Ref Range  Glucose-Capillary 103 (H) 70 - 99 mg/dL   Comment: Glucose reference range applies only to samples taken after fasting for at least 8 hours.  Comment 1 Notify RN   Comment 2 Document in Chart  Glucose, capillary     Status: None  Collection Time: 09/13/23 11:14 AM Result Value Ref Range  Glucose-Capillary 95 70 - 99  mg/dL   Comment: Glucose reference range applies only to samples taken after fasting for at least 8 hours. Glucose, capillary     Status: None  Collection Time: 09/13/23  5:30 PM Result Value Ref Range  Glucose-Capillary 96 70 - 99 mg/dL   Comment: Glucose reference range applies only to samples taken after fasting for at least 8 hours. Glucose, capillary     Status: Abnormal  Collection Time: 09/13/23  9:17 PM Result Value Ref Range  Glucose-Capillary 100 (H) 70 - 99 mg/dL   Comment: Glucose reference range applies only to samples taken after fasting for at least 8 hours.  Comment 1 Notify  RN   Comment 2 Document in Chart  Gastrointestinal Panel by PCR , Stool     Status: None  Collection Time: 09/13/23  9:39 PM  Specimen: STOOL Result Value Ref Range  Campylobacter species NOT DETECTED NOT DETECTED  Plesimonas shigelloides NOT DETECTED NOT DETECTED  Salmonella species NOT DETECTED NOT DETECTED  Yersinia enterocolitica NOT DETECTED NOT DETECTED  Vibrio species NOT DETECTED NOT DETECTED  Vibrio cholerae NOT DETECTED NOT DETECTED  Enteroaggregative E coli (EAEC) NOT DETECTED NOT DETECTED  Enteropathogenic E coli (EPEC) NOT DETECTED NOT DETECTED  Enterotoxigenic E coli (ETEC) NOT DETECTED NOT DETECTED  Shiga like toxin producing E coli (STEC) NOT DETECTED NOT DETECTED  Shigella/Enteroinvasive E coli (EIEC) NOT DETECTED NOT DETECTED  Cryptosporidium NOT DETECTED NOT DETECTED  Cyclospora cayetanensis NOT DETECTED NOT DETECTED  Entamoeba histolytica NOT DETECTED NOT DETECTED  Giardia lamblia NOT DETECTED NOT DETECTED  Adenovirus F40/41 NOT DETECTED NOT DETECTED  Astrovirus NOT DETECTED NOT DETECTED  Norovirus GI/GII NOT DETECTED NOT DETECTED  Rotavirus A NOT DETECTED NOT DETECTED  Sapovirus (I, II, IV, and V) NOT DETECTED NOT DETECTED   Comment: Performed at [facility], [address] [place], [place] [address] C Difficile Quick Screen (NO PCR Reflex)     Status:  Abnormal  Collection Time: 09/13/23  9:40 PM  Specimen: STOOL Result Value Ref Range  C Diff antigen POSITIVE (A) NEGATIVE  C Diff toxin NEGATIVE NEGATIVE  C Diff interpretation     Results are indeterminate. Please contact the provider listed for your campus for C diff questions in AMION.   Comment: Performed at [facility], [address], [place], [place] [address] Culture, blood (Routine X 2) w Reflex to ID Panel     Status: None  Collection Time: 09/13/23  9:56 PM  Specimen: BLOOD Result Value Ref Range  Specimen Description BLOOD BLOOD LEFT ARM   Special Requests     BOTTLES DRAWN AEROBIC AND ANAEROBIC Blood Culture adequate volume  Culture     NO GROWTH 5 DAYS Performed at [facility], [address], [place], [place] [address]   Report Status 09/18/2023 FINAL  Culture, blood (Routine X 2) w Reflex to ID Panel     Status: None  Collection Time: 09/13/23  9:57 PM  Specimen: BLOOD LEFT HAND Result Value Ref Range  Specimen Description BLOOD LEFT HAND   Special Requests     BOTTLES DRAWN AEROBIC AND ANAEROBIC Blood Culture adequate volume  Culture     NO GROWTH 5 DAYS Performed at [facility], [address], [place], [place] [address]   Report Status 09/18/2023 FINAL  Glucose, capillary     Status: Abnormal  Collection Time: 09/14/23  6:17 AM Result Value Ref Range  Glucose-Capillary 103 (H) 70 - 99 mg/dL   Comment: Glucose reference range applies only to samples taken after fasting for at least 8 hours.  Comment 1 Notify RN   Comment 2 Document in Chart  Glucose, capillary     Status: None  Collection Time: 09/14/23 12:46 PM Result Value Ref Range  Glucose-Capillary 98 70 - 99 mg/dL   Comment: Glucose reference range applies only to samples taken after fasting for at least 8 hours. C. Diff by PCR     Status: Abnormal  Collection Time: 09/14/23  2:40 PM  Specimen: STOOL Result Value Ref Range  Toxigenic C. Difficile by PCR POSITIVE  (A) NEGATIVE   Comment: Positive for toxigenic C. difficile with  little to no toxin production. Only treat if clinical presentation suggests symptomatic illness. Performed at [facility], [address], [place], [place] [address]  CBC with Differential/Platelet     Status: Abnormal  Collection Time: 09/14/23  2:56 PM Result Value Ref Range  WBC 4.8 4.0 - 10.5 K/uL  RBC 3.69 (L) 3.87 - 5.11 MIL/uL  Hemoglobin 10.3 (L) 12.0 - 15.0 g/dL  HCT 60.4 (L) 54.0 - 98.1 %  MCV 90.2 80.0 - 100.0 fL  MCH 27.9 26.0 - 34.0 pg  MCHC 30.9 30.0 - 36.0 g/dL  RDW 19.1 (H) 47.8 - 29.5 %  Platelets 304 150 - 400 K/uL  nRBC 0.0 0.0 - 0.2 %  Neutrophils Relative % 55 %  Neutro Abs 2.6 1.7 - 7.7 K/uL  Lymphocytes Relative 31 %  Lymphs Abs 1.5 0.7 - 4.0 K/uL  Monocytes Relative 10 %  Monocytes Absolute 0.5 0.1 - 1.0 K/uL  Eosinophils Relative 3 %  Eosinophils Absolute 0.2 0.0 - 0.5 K/uL  Basophils Relative 1 %  Basophils Absolute 0.1 0.0 - 0.1 K/uL  Immature Granulocytes 0 %  Abs Immature Granulocytes 0.02 0.00 - 0.07 K/uL   Comment: Performed at [facility], [address] [place], [place] [address] Comprehensive metabolic panel     Status: Abnormal  Collection Time: 09/14/23  2:56 PM Result Value Ref Range  Sodium 136 135 - 145 mmol/L  Potassium 3.8 3.5 - 5.1 mmol/L  Chloride 105 98 - 111 mmol/L  CO2 24 22 - 32 mmol/L  Glucose, Bld 102 (H) 70 - 99 mg/dL   Comment: Glucose reference range applies only to samples taken after fasting for at least 8 hours.  BUN 6 (L) 8 - 23 mg/dL  Creatinine, Ser 8.65 7.84 - 1.00 mg/dL  Calcium 8.6 (L) 8.9 - 10.3 mg/dL  Total Protein 5.9 (L) 6.5 - 8.1 g/dL  Albumin 3.0 (L) 3.5 - 5.0 g/dL  AST 33 15 - 41 U/L  ALT 26 0 - 44 U/L  Alkaline Phosphatase 68 38 - 126 U/L  Total Bilirubin 0.6 0.3 - 1.2 mg/dL  GFR, Estimated >69 >62 mL/min   Comment: (NOTE) Calculated using the CKD-EPI Creatinine Equation (2021)   Anion gap 7 5 -  15   Comment: Performed at [facility], [address] [place], [place] [address] Glucose, capillary     Status: None  Collection Time: 09/14/23  4:47 PM Result Value Ref Range  Glucose-Capillary 82 70 - 99 mg/dL   Comment: Glucose reference range applies only to samples taken after fasting for at least 8 hours. Glucose, capillary     Status: None  Collection Time: 09/14/23  9:36 PM Result Value Ref Range  Glucose-Capillary 93 70 - 99 mg/dL   Comment: Glucose reference range applies only to samples taken after fasting for at least 8 hours. Glucose, capillary     Status: Abnormal  Collection Time: 09/15/23  6:46 AM Result Value Ref Range  Glucose-Capillary 113 (H) 70 - 99 mg/dL   Comment: Glucose reference range applies only to samples taken after fasting for at least 8 hours. Comprehensive metabolic panel     Status: Abnormal  Collection Time: 09/15/23  7:45 AM Result Value Ref Range  Sodium 135 135 - 145 mmol/L  Potassium 3.1 (L) 3.5 - 5.1 mmol/L  Chloride 103 98 - 111 mmol/L  CO2 23 22 - 32 mmol/L  Glucose, Bld 113 (H) 70 - 99 mg/dL   Comment: Glucose  reference range applies only to samples taken after fasting for at least 8 hours.  BUN 5 (L) 8 - 23 mg/dL  Creatinine, Ser 8.46 9.62 - 1.00 mg/dL  Calcium 8.3 (L) 8.9 - 10.3 mg/dL  Total Protein 5.3 (L) 6.5 - 8.1 g/dL  Albumin 2.6 (L) 3.5 - 5.0 g/dL  AST 26 15 - 41 U/L  ALT 26 0 - 44 U/L  Alkaline Phosphatase 61 38 - 126 U/L  Total Bilirubin 0.3 0.3 - 1.2 mg/dL  GFR, Estimated >95 >28 mL/min   Comment: (NOTE) Calculated using the CKD-EPI Creatinine Equation (2021)   Anion gap 9 5 - 15   Comment: Performed at [facility], [address], [place], [place] [address] CBC with Differential/Platelet     Status: Abnormal  Collection Time: 09/15/23  7:45 AM Result Value Ref Range  WBC 4.0 4.0 - 10.5 K/uL  RBC 3.49 (L) 3.87 - 5.11 MIL/uL  Hemoglobin 9.8 (L) 12.0 - 15.0 g/dL  HCT 40.1 (L) 02.7 - 25.3  %  MCV 90.0 80.0 - 100.0 fL  MCH 28.1 26.0 - 34.0 pg  MCHC 31.2 30.0 - 36.0 g/dL  RDW 66.4 (H) 40.3 - 47.4 %  Platelets 254 150 - 400 K/uL  nRBC 0.0 0.0 - 0.2 %  Neutrophils Relative % 53 %  Neutro Abs 2.2 1.7 - 7.7 K/uL  Lymphocytes Relative 31 %  Lymphs Abs 1.2 0.7 - 4.0 K/uL  Monocytes Relative 11 %  Monocytes Absolute 0.4 0.1 - 1.0 K/uL  Eosinophils Relative 3 %  Eosinophils Absolute 0.1 0.0 - 0.5 K/uL  Basophils Relative 1 %  Basophils Absolute 0.0 0.0 - 0.1 K/uL  Immature Granulocytes 1 %  Abs Immature Granulocytes 0.02 0.00 - 0.07 K/uL   Comment: Performed at [facility], [address], [place], [place] [address] Magnesium     Status: Abnormal  Collection Time: 09/15/23  7:45 AM Result Value Ref Range  Magnesium 1.4 (L) 1.7 - 2.4 mg/dL   Comment: Performed at [facility], [address], [place], [place] [address] Phosphorus     Status: None  Collection Time: 09/15/23  7:45 AM Result Value Ref Range  Phosphorus 2.9 2.5 - 4.6 mg/dL   Comment: Performed at [facility], [address] [place], [place] [address] Glucose, capillary     Status: None  Collection Time: 09/15/23  1:03 PM Result Value Ref Range  Glucose-Capillary 95 70 - 99 mg/dL   Comment: Glucose reference range applies only to samples taken after fasting for at least 8 hours.  Comment 1 Notify RN  Glucose, capillary     Status: Abnormal  Collection Time: 09/15/23  4:03 PM Result Value Ref Range  Glucose-Capillary 136 (H) 70 - 99 mg/dL   Comment: Glucose reference range applies only to samples taken after fasting for at least 8 hours.  Comment 1 Notify RN  Glucose, capillary     Status: Abnormal  Collection Time: 09/15/23  9:30 PM Result Value Ref Range  Glucose-Capillary 130 (H) 70 - 99 mg/dL   Comment: Glucose reference range applies only to samples taken after fasting for at least 8 hours. Glucose, capillary     Status: Abnormal  Collection Time: 09/16/23  6:12  AM Result Value Ref Range  Glucose-Capillary 104 (H) 70 - 99 mg/dL   Comment: Glucose reference range applies only to samples taken after fasting for at least 8 hours. CBC with Differential/Platelet     Status:  Abnormal  Collection Time: 09/16/23  7:03 AM Result Value Ref Range  WBC 4.2 4.0 - 10.5 K/uL  RBC 3.61 (L) 3.87 - 5.11 MIL/uL  Hemoglobin 9.9 (L) 12.0 - 15.0 g/dL  HCT 81.1 (L) 91.4 - 78.2 %  MCV 89.2 80.0 - 100.0 fL  MCH 27.4 26.0 - 34.0 pg  MCHC 30.7 30.0 - 36.0 g/dL  RDW 95.6 (H) 21.3 - 08.6 %  Platelets 251 150 - 400 K/uL  nRBC 0.0 0.0 - 0.2 %  Neutrophils Relative % 48 %  Neutro Abs 2.0 1.7 - 7.7 K/uL  Lymphocytes Relative 35 %  Lymphs Abs 1.5 0.7 - 4.0 K/uL  Monocytes Relative 12 %  Monocytes Absolute 0.5 0.1 - 1.0 K/uL  Eosinophils Relative 3 %  Eosinophils Absolute 0.1 0.0 - 0.5 K/uL  Basophils Relative 1 %  Basophils Absolute 0.1 0.0 - 0.1 K/uL  Immature Granulocytes 1 %  Abs Immature Granulocytes 0.03 0.00 - 0.07 K/uL   Comment: Performed at [facility], [address], [place], [place] [address] Comprehensive metabolic panel     Status: Abnormal  Collection Time: 09/16/23  7:03 AM Result Value Ref Range  Sodium 137 135 - 145 mmol/L  Potassium 4.3 3.5 - 5.1 mmol/L  Chloride 108 98 - 111 mmol/L  CO2 21 (L) 22 - 32 mmol/L  Glucose, Bld 115 (H) 70 - 99 mg/dL   Comment: Glucose reference range applies only to samples taken after fasting for at least 8 hours.  BUN 5 (L) 8 - 23 mg/dL  Creatinine, Ser 9.62 9.52 - 1.00 mg/dL  Calcium 8.4 (L) 8.9 - 10.3 mg/dL  Total Protein 5.4 (L) 6.5 - 8.1 g/dL  Albumin 2.8 (L) 3.5 - 5.0 g/dL  AST 29 15 - 41 U/L  ALT 26 0 - 44 U/L  Alkaline Phosphatase 62 38 - 126 U/L  Total Bilirubin 0.6 0.3 - 1.2 mg/dL  GFR, Estimated >84 >13 mL/min   Comment: (NOTE) Calculated using the CKD-EPI Creatinine Equation (2021)   Anion gap 8 5 - 15   Comment: Performed at [facility], [address] [place], [place]  [address] Magnesium     Status: None  Collection Time: 09/16/23  7:03 AM Result Value Ref Range  Magnesium 2.1 1.7 - 2.4 mg/dL   Comment: Performed at [facility], [address], [place], [place] [address] Phosphorus     Status: None  Collection Time: 09/16/23  7:03 AM Result Value Ref Range  Phosphorus 2.9 2.5 - 4.6 mg/dL   Comment: Performed at [facility], [address], [place], [place] [address] Urine Culture (for pregnant, neutropenic or urologic patients or patients with an indwelling urinary catheter)     Status: None  Collection Time: 09/16/23 11:00 AM  Specimen: Urine, Clean Catch Result Value Ref Range  Specimen Description URINE, CLEAN CATCH   Special Requests NONE   Culture     NO GROWTH Performed at [facility], [address], [place], [place] [address]   Report Status 09/17/2023 FINAL  Glucose, capillary     Status: Abnormal  Collection Time: 09/16/23 12:37 PM Result Value Ref Range  Glucose-Capillary 158 (H) 70 - 99 mg/dL   Comment: Glucose reference range applies only to samples taken after fasting for at least 8 hours. Glucose, capillary     Status: None  Collection Time: 09/16/23  4:52 PM Result Value Ref Range  Glucose-Capillary 83 70 - 99 mg/dL  Comment: Glucose reference range applies only to samples taken after fasting for at least 8 hours. Glucose, capillary     Status: Abnormal  Collection Time: 09/16/23  9:49 PM Result Value Ref Range  Glucose-Capillary 142 (H) 70 - 99 mg/dL   Comment: Glucose reference range applies only to samples taken after fasting for at least 8 hours. Glucose, capillary     Status: None  Collection Time: 09/17/23  6:03 AM Result Value Ref Range  Glucose-Capillary 96 70 - 99 mg/dL   Comment: Glucose reference range applies only to samples taken after fasting for at least 8 hours. Glucose, capillary     Status: Abnormal  Collection Time: 09/17/23 12:16 PM Result Value Ref Range  Glucose-Capillary 149  (H) 70 - 99 mg/dL   Comment: Glucose reference range applies only to samples taken after fasting for at least 8 hours.  Comment 1 Notify RN   Comment 2 Document in Chart  Glucose, capillary     Status: None  Collection Time: 09/17/23  4:20 PM Result Value Ref Range  Glucose-Capillary 91 70 - 99 mg/dL   Comment: Glucose reference range applies only to samples taken after fasting for at least 8 hours.  Comment 1 Notify RN   Comment 2 Document in Chart  Glucose, capillary     Status: Abnormal  Collection Time: 09/17/23  9:03 PM Result Value Ref Range  Glucose-Capillary 119 (H) 70 - 99 mg/dL   Comment: Glucose reference range applies only to samples taken after fasting for at least 8 hours.  Comment 1 Notify RN   Comment 2 Document in Chart  Glucose, capillary     Status: Abnormal  Collection Time: 09/18/23  6:39 AM Result Value Ref Range  Glucose-Capillary 111 (H) 70 - 99 mg/dL   Comment: Glucose reference range applies only to samples taken after fasting for at least 8 hours. Glucose, capillary     Status: Abnormal  Collection Time: 09/18/23 12:49 PM Result Value Ref Range  Glucose-Capillary 215 (H) 70 - 99 mg/dL   Comment: Glucose reference range applies only to samples taken after fasting for at least 8 hours. Glucose, capillary     Status: Abnormal  Collection Time: 09/18/23  5:13 PM Result Value Ref Range  Glucose-Capillary 149 (H) 70 - 99 mg/dL   Comment: Glucose reference range applies only to samples taken after fasting for at least 8 hours. Glucose, capillary     Status: Abnormal  Collection Time: 09/18/23 10:48 PM Result Value Ref Range  Glucose-Capillary 126 (H) 70 - 99 mg/dL   Comment: Glucose reference range applies only to samples taken after fasting for at least 8 hours.  Comment 1 Notify RN   Comment 2 Document in Chart  Glucose, capillary     Status: Abnormal  Collection Time: 09/19/23  6:23 AM Result Value Ref Range  Glucose-Capillary 125 (H) 70 - 99  mg/dL   Comment: Glucose reference range applies only to samples taken after fasting for at least 8 hours.  Comment 1 Notify RN   Comment 2 Document in Chart  POCT Activated clotting time     Status: None  Collection Time: 09/19/23  9:14 AM Result Value Ref Range  Activated Clotting Time 189 seconds   Comment: Reference range 74-137 seconds for patients not on anticoagulant therapy. Glucose, capillary     Status: Abnormal  Collection Time: 09/19/23  9:52 AM Result Value Ref Range  Glucose-Capillary 126 (H) 70 - 99 mg/dL   Comment: Glucose reference  range applies only to samples taken after fasting for at least 8 hours. Glucose, capillary     Status: Abnormal  Collection Time: 09/19/23  5:32 PM Result Value Ref Range  Glucose-Capillary 231 (H) 70 - 99 mg/dL   Comment: Glucose reference range applies only to samples taken after fasting for at least 8 hours. Glucose, capillary     Status: Abnormal  Collection Time: 09/19/23  9:30 PM Result Value Ref Range  Glucose-Capillary 206 (H) 70 - 99 mg/dL   Comment: Glucose reference range applies only to samples taken after fasting for at least 8 hours. Comprehensive metabolic panel     Status: Abnormal  Collection Time: 09/20/23  4:31 AM Result Value Ref Range  Sodium 136 135 - 145 mmol/L  Potassium 4.1 3.5 - 5.1 mmol/L  Chloride 107 98 - 111 mmol/L  CO2 21 (L) 22 - 32 mmol/L  Glucose, Bld 143 (H) 70 - 99 mg/dL   Comment: Glucose reference range applies only to samples taken after fasting for at least 8 hours.  BUN 13 8 - 23 mg/dL  Creatinine, Ser 4.09 8.11 - 1.00 mg/dL  Calcium 8.2 (L) 8.9 - 10.3 mg/dL  Total Protein 5.2 (L) 6.5 - 8.1 g/dL  Albumin 2.8 (L) 3.5 - 5.0 g/dL  AST 22 15 - 41 U/L  ALT 25 0 - 44 U/L  Alkaline Phosphatase 60 38 - 126 U/L  Total Bilirubin 0.6 0.3 - 1.2 mg/dL  GFR, Estimated >91 >47 mL/min   Comment: (NOTE) Calculated using the CKD-EPI Creatinine Equation (2021)   Anion gap 8 5 - 15   Comment: Performed at  [facility], [address] [place], [place] [address] Magnesium     Status: Abnormal  Collection Time: 09/20/23  4:31 AM Result Value Ref Range  Magnesium 1.6 (L) 1.7 - 2.4 mg/dL   Comment: Performed at [facility], [address] [place], [place] [address] Phosphorus     Status: None  Collection Time: 09/20/23  4:31 AM Result Value Ref Range  Phosphorus 3.4 2.5 - 4.6 mg/dL   Comment: Performed at [facility], [address], [place], [place] [address] Glucose, capillary     Status: Abnormal  Collection Time: 09/20/23  7:58 AM Result Value Ref Range  Glucose-Capillary 116 (H) 70 - 99 mg/dL   Comment: Glucose reference range applies only to samples taken after fasting for at least 8 hours. Glucose, capillary     Status: Abnormal  Collection Time: 09/20/23 12:14 PM Result Value Ref Range  Glucose-Capillary 146 (H) 70 - 99 mg/dL   Comment: Glucose reference range applies only to samples taken after fasting for at least 8 hours.   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 EXAM: IR ANGIO VERTEBRAL SEL VERTEBRAL UNI RIGHT MOD SED COMPARISON:  Recent diagnostic arteriogram of September 09, 2023. MEDICATIONS: Heparin 3,000 units IV. No antibiotic was administered within 1 hour of the procedure. ANESTHESIA/SEDATION: General anesthesia. CONTRAST:  Omnipaque 300 approximately 50 mL. FLUOROSCOPY TIME:  Fluoroscopy Time: 7 minutes 0 seconds (136 mGy). COMPLICATIONS: None immediate. TECHNIQUE: Informed written consent was obtained from the patient after a thorough discussion of the procedural risks, benefits and alternatives. All questions were addressed. Maximal Sterile Barrier Technique was utilized including caps, mask, sterile gowns, sterile  gloves, sterile drape, hand hygiene and skin antiseptic. A timeout was performed prior to the initiation of the procedure. The right groin was prepped and draped in the usual sterile fashion. Thereafter using modified Seldinger technique, transfemoral access into the right common femoral artery was obtained without difficulty. Over an 0.035 inch guidewire, an 8 French 25 cm Pinnacle sheath was inserted. Through this, and also over an 0.035 inch guidewire, a 5 [place] JB 1 catheter was advanced to the aortic arch region and selectively positioned in right subclavian artery distal to the origin of the right vertebral artery. The guidewire was removed good. Good aspiration was obtained from the hub of the diagnostic catheter. FINDINGS: The 5 French diagnostic catheter in the distal right subclavian artery was then exchange for an 035 inch 300 cm Rosen exchange guidewire in the distal right subclavian artery. Over this, a combination of a 5 [place] JB 1 catheter inside of an 088 80 cm micro sheath was advanced to just proximal to the origin of the right vertebral artery. The exchange wire was removed. Good aspiration was obtained from the hub of the diagnostic catheter. Roadmap was then performed through the JB 1 catheter in the origin of the right vertebral artery centered extra cranially. This combination was then advanced to the distal right vertebral artery over an 035 inch Roadrunner guidewire. The guidewire, and the diagnostic catheter were removed. Good aspiration obtained from the hub of the sheath. A gentle control arteriogram performed through this demonstrated moderate spasm of the vertebral artery which responded to 25 mcg of nitroglycerin. Arteriograms were then performed centered intracranially. Multiple oblique magnified views were obtained. These demonstrated wide patency of the right vertebrobasilar junction and the right posterior-inferior cerebellar artery. The distal basilar artery was now fully patent  with approximately 30% stenosis at the origin of the right superior cerebellar artery. Complete patency was now present of the right posterior cerebral artery into the distal distribution. Patency was also seen of the superior cerebellar arteries bilaterally, including the anterior-inferior cerebellar arteries. Given the completely revascularized distal basilar artery, and the right posterior artery proximally, the procedure was stopped. Manual compression for 25 minutes was then held at the right groin puncture site. Distal pulses remained palpable in both feet unchanged. The patient's general anesthesia was reversed, and the patient was extubated. Upon recovery, the patient denied any headaches, nausea or vomiting or visual symptoms. She was able to move all fours equally. She was then transferred to the PACU and then neuro ICU. IMPRESSION: Interval complete revascularization of the previously noted severe pre occlusive distal basilar artery and the proximal P1 segment with a suggestion of mild stenosis just distal to the origin of the right superior cerebellar artery. PLAN: Follow-up in the clinic in 3 months with CT angiogram of the head and neck. Electronically Signed   By: Julieanne Cotton M.D.   On: 09/20/2023 08:28   VAS [place] MESENTERIC  Result Date: 09/15/2023 ABDOMINAL VISCERAL Patient Name:  MINNETTA PROTHRO  Date of Exam:   09/15/2023 Medical Rec #: [id number]        Accession #:    [id number] Date of Birth: 31-Mar-1946        Patient Gender: F Patient Age:   79 years Exam Location:  [facility] Procedure:      VAS [place] MESENTERIC Referring Phys: [id number] [facility] -------------------------------------------------------------------------------- Indications: Evaluate for mesenteric ischemia. Chronic history of colitis. High Risk Factors: Hypertension, hyperlipidemia, Diabetes. Other Factors: Status post appendectomy 3  weeks ago.                Fibromyalgia. Comparison Study: No priors.  Performing Technologist: Marilynne Halsted RDMS, RVT  Examination Guidelines: A complete evaluation includes B-mode imaging, spectral Doppler, color Doppler, and power Doppler as needed of all accessible portions of each vessel. Bilateral testing is considered an integral part of a complete examination. Limited examinations for reoccurring indications may be performed as noted.  Duplex Findings: +----------------------+--------+--------+----------+-------------------+ Mesenteric            PSV cm/sEDV cm/s  Plaque       Comments       +----------------------+--------+--------+----------+-------------------+ Aorta Distal            135                                         +----------------------+--------+--------+----------+-------------------+ Celiac Artery Origin    458           hypoechoic                    +----------------------+--------+--------+----------+-------------------+ Celiac Artery Proximal  299                                         +----------------------+--------+--------+----------+-------------------+ SMA Origin              295                     not well visualized +----------------------+--------+--------+----------+-------------------+ SMA Proximal            356           hypoechoic                    +----------------------+--------+--------+----------+-------------------+ SMA Mid                 205                                         +----------------------+--------+--------+----------+-------------------+ SMA Distal              216                                         +----------------------+--------+--------+----------+-------------------+ IMA                     208                                         +----------------------+--------+--------+----------+-------------------+  Mesenteric Technologist observations: Elevated velocities suggest low end of range ( 70-99%) in the SMA.  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 .  *See table(s) above for measurements and observations.  Diagnosing physician: Carolynn Sayers  Electronically signed by Carolynn Sayers on 09/15/2023 at 6:53:48 PM.    Final    IR ANGIO VERTEBRAL SEL VERTEBRAL BILAT MOD SED  Result Date: 09/13/2023 CLINICAL DATA:  History of nausea, vomiting and mild right  sided numbness leg > arm, associated with mental status changes. CT angiogram and MRA revealing questionable progression of distal basilar artery proximal right PCA stenosis. EXAM: BILATERAL COMMON CAROTID AND INNOMINATE ANGIOGRAPHY COMPARISON:  Diagnostic catheter arteriogram of January 03, 2012; MRA MRI of the brain of September 09, 2023. MEDICATIONS: Heparin 1000 units IV. No antibiotic was administered within 1 hour of the procedure. ANESTHESIA/SEDATION: Versed 2 mg mg IV; Fentanyl 50 mcg IV Moderate Sedation Time:  33 minutes The patient was continuously monitored during the procedure by the interventional radiology nurse under my direct supervision. CONTRAST:  Omnipaque 300 approximately 65 mL. FLUOROSCOPY TIME:  Fluoroscopy Time: 9 minutes 48 seconds (421 mGy). COMPLICATIONS: None immediate. TECHNIQUE: Informed written consent was obtained from the patient after a thorough discussion of the procedural risks, benefits and alternatives. All questions were addressed. Maximal Sterile Barrier Technique was utilized including caps, mask, sterile gowns, sterile gloves, sterile drape, hand hygiene and skin antiseptic. A timeout was performed prior to the initiation of the procedure. The right groin was prepped and draped in the usual sterile fashion. Thereafter using modified Seldinger technique, transfemoral access into the right common femoral artery was obtained without difficulty. Over an 0.035 inch guidewire, a 5 French Pinnacle sheath was inserted. Through this, and also over an 0.035 inch guidewire, a 5 [place] JB 1 catheter was advanced to the aortic  arch region and selectively positioned in the right vertebral artery, the right common carotid artery, the left common carotid artery and the left subclavian artery. FINDINGS: The innominate arteriogram demonstrates the proximal right subclavian artery and the right common carotid artery to be widely patent. The dominant right vertebral artery origin is widely patent. The vessel opacifies to the cranial skull base. Patency is seen of the right vertebrobasilar junction and the right posterior-inferior cerebellar artery. The proximal 2/3 of the basilar artery is widely patent. Patency is also seen of the anterior-inferior cerebellar arteries bilaterally, with suggestion of a right anterior-inferior cerebellar artery posterior-inferior cerebellar artery complex, a developmental variation. More distally, there is severe stenosis of the distal basilar artery at the level of the superior cerebellar arteries, extending distally into the right posterior cerebral artery P1 segment. Severe stenosis is also evident of the left superior cerebellar arteries at their origins. The right common carotid arteriogram demonstrates mild stenosis of the proximal right external carotid artery. Its branches opacify widely. The right internal carotid artery at the bulb has approximately 50% stenosis by the NASCET criteria with no evidence of intraluminal filling defects or ulcerations. More distally, the vessel is seen to opacify to the cranial skull base. There is moderate stenosis of the petrous cavernous junction. More distally, the distal cavernous and the supraclinoid right ICA are widely patent. The right middle cerebral artery demonstrates a 50% stenosis at its distal M1 segment. The MCA bifurcation branches are patent with scattered areas of mild atherosclerotic changes. The right anterior cerebral artery A1 A2 segments is widely patent with transient opacification via the anterior communicating artery of the left anterior cerebral  artery A2 segment and distally. A small focal outpouching is seen projecting inferiorly and slightly laterally from the anterior communicating artery, which may represent a small vascular loop versus a 1 mm aneurysm. The left common carotid arteriogram demonstrates a mild stenosis at the origin of the left external carotid artery. Its branches opacify widely. The left internal carotid artery at the bulb and proximal 1/3 again demonstrates a widely patent stented segment with suggestion of minimal neointimal hyperplasia.  More distally, the left internal carotid artery opacifies to the cranial skull base. The petrous, the cavernous and the supraclinoid ICA are widely patent. The left posterior communicating artery is seen opacifying the left posterior cerebral artery distribution with a mild-to-moderate stenosis. The left middle cerebral artery and the hypoplastic left anterior cerebral artery opacify into the capillary and venous phases. Also demonstrated is delayed opacification of the right posterior cerebral artery P2 segment and distally from the left posterior communicating artery. The left subclavian arteriogram demonstrates mild narrowing at the origin of the hypoplastic left vertebral artery. The vessel, otherwise, is seen to opacify to the cranial skull base where it terminates primarily in the ipsilateral left posterior-inferior cerebellar artery. 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 Associated significant intracranial arteriosclerotic related stenosis at the origins of the superior cerebellar arteries. Suggestion of retrograde opacification of the right posterior cerebral artery P2 segments and distally from the left posterior communicating artery. Approximately 50% stenosis of the proximal right internal carotid artery secondary to atherosclerotic  disease. PLAN: Findings reviewed with patient and the patient's referring neurologist. Option of endovascular revascularization of severe high-grade probably symptomatic stenosis of the distal basilar artery to be discussed with the patient. Electronically Signed   By: Julieanne Cotton M.D.   On: 09/13/2023 08:16   IR ANGIO INTRA EXTRACRAN SEL COM CAROTID INNOMINATE BILAT MOD SED  Result Date: 09/13/2023 CLINICAL DATA:  History of nausea, vomiting and mild right sided numbness leg > arm, associated with mental status changes. CT angiogram and MRA revealing questionable progression of distal basilar artery proximal right PCA stenosis. EXAM: BILATERAL COMMON CAROTID AND INNOMINATE ANGIOGRAPHY COMPARISON:  Diagnostic catheter arteriogram of January 03, 2012; MRA MRI of the brain of September 09, 2023. MEDICATIONS: Heparin 1000 units IV. No antibiotic was administered within 1 hour of the procedure. ANESTHESIA/SEDATION: Versed 2 mg mg IV; Fentanyl 50 mcg IV Moderate Sedation Time:  33 minutes The patient was continuously monitored during the procedure by the interventional radiology nurse under my direct supervision. CONTRAST:  Omnipaque 300 approximately 65 mL. FLUOROSCOPY TIME:  Fluoroscopy Time: 9 minutes 48 seconds (421 mGy). COMPLICATIONS: None immediate. TECHNIQUE: Informed written consent was obtained from the patient after a thorough discussion of the procedural risks, benefits and alternatives. All questions were addressed. Maximal Sterile Barrier Technique was utilized including caps, mask, sterile gowns, sterile gloves, sterile drape, hand hygiene and skin antiseptic. A timeout was performed prior to the initiation of the procedure. The right groin was prepped and draped in the usual sterile fashion. Thereafter using modified Seldinger technique, transfemoral access into the right common femoral artery was obtained without difficulty. Over an 0.035 inch guidewire, a 5 French Pinnacle sheath was  inserted. Through this, and also over an 0.035 inch guidewire, a 5 [place] JB 1 catheter was advanced to the aortic arch region and selectively positioned in the right vertebral artery, the right common carotid artery, the left common carotid artery and the left subclavian artery. FINDINGS: The innominate arteriogram demonstrates the proximal right subclavian artery and the right common carotid artery to be widely patent. The dominant right vertebral artery origin is widely patent. The vessel opacifies to the cranial skull base. Patency is seen of the right vertebrobasilar junction and the right posterior-inferior cerebellar artery. The proximal 2/3 of the basilar artery is widely patent. Patency is also seen of the anterior-inferior cerebellar arteries bilaterally, with  suggestion of a right anterior-inferior cerebellar artery posterior-inferior cerebellar artery complex, a developmental variation. More distally, there is severe stenosis of the distal basilar artery at the level of the superior cerebellar arteries, extending distally into the right posterior cerebral artery P1 segment. Severe stenosis is also evident of the left superior cerebellar arteries at their origins. The right common carotid arteriogram demonstrates mild stenosis of the proximal right external carotid artery. Its branches opacify widely. The right internal carotid artery at the bulb has approximately 50% stenosis by the NASCET criteria with no evidence of intraluminal filling defects or ulcerations. More distally, the vessel is seen to opacify to the cranial skull base. There is moderate stenosis of the petrous cavernous junction. More distally, the distal cavernous and the supraclinoid right ICA are widely patent. The right middle cerebral artery demonstrates a 50% stenosis at its distal M1 segment. The MCA bifurcation branches are patent with scattered areas of mild atherosclerotic changes. The right anterior cerebral artery A1 A2 segments  is widely patent with transient opacification via the anterior communicating artery of the left anterior cerebral artery A2 segment and distally. A small focal outpouching is seen projecting inferiorly and slightly laterally from the anterior communicating artery, which may represent a small vascular loop versus a 1 mm aneurysm. The left common carotid arteriogram demonstrates a mild stenosis at the origin of the left external carotid artery. Its branches opacify widely. The left internal carotid artery at the bulb and proximal 1/3 again demonstrates a widely patent stented segment with suggestion of minimal neointimal hyperplasia. More distally, the left internal carotid artery opacifies to the cranial skull base. The petrous, the cavernous and the supraclinoid ICA are widely patent. The left posterior communicating artery is seen opacifying the left posterior cerebral artery distribution with a mild-to-moderate stenosis. The left middle cerebral artery and the hypoplastic left anterior cerebral artery opacify into the capillary and venous phases. Also demonstrated is delayed opacification of the right posterior cerebral artery P2 segment and distally from the left posterior communicating artery. The left subclavian arteriogram demonstrates mild narrowing at the origin of the hypoplastic left vertebral artery. The vessel, otherwise, is seen to opacify to the cranial skull base where it terminates primarily in the ipsilateral left posterior-inferior cerebellar artery. 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 Associated significant intracranial arteriosclerotic related stenosis at the origins of the superior cerebellar arteries. Suggestion of retrograde opacification of the right posterior cerebral artery P2 segments and distally from the left posterior  communicating artery. Approximately 50% stenosis of the proximal right internal carotid artery secondary to atherosclerotic disease. PLAN: Findings reviewed with patient and the patient's referring neurologist. Option of endovascular revascularization of severe high-grade probably symptomatic stenosis of the distal basilar artery to be discussed with the patient. Electronically Signed   By: Julieanne Cotton M.D.   On: 09/13/2023 08:16   ECHOCARDIOGRAM COMPLETE BUBBLE STUDY  Result Date: 09/10/2023    ECHOCARDIOGRAM REPORT   Patient Name:   [name] Date of Exam: 09/10/2023 Medical Rec #:  [id number]       Height:       62.0 in Accession #:    [id number]      Weight:       145.0 lb Date of Birth:  [birth year]       BSA:          1.667 m Patient Age:  77 years        BP:           114/49 mmHg Patient Gender: F               HR:           58 bpm. Exam Location:  Inpatient Procedure: 2D Echo, Color Doppler, Cardiac Doppler and Saline Contrast Bubble            Study Indications:    Stroke  History:        Patient has prior history of Echocardiogram examinations, most                 recent 11/11/2011. Stroke, TIA and PAD; Risk                 Factors:Hypertension, Diabetes and Dyslipidemia.  Sonographer:    Milbert Coulter Referring Phys: [id number] CORTNEY E DE LA TORRE IMPRESSIONS  1. Left ventricular ejection fraction, by estimation, is 60 to 65%. The left ventricle has normal function. The left ventricle has no regional wall motion abnormalities. There is mild left ventricular hypertrophy. Left ventricular diastolic parameters are consistent with Grade I diastolic dysfunction (impaired relaxation).  2. Right ventricular systolic function is normal. The right ventricular size is normal. Tricuspid regurgitation signal is inadequate for assessing PA pressure.  3. Resolution is challenging for bubble study; possible small PFO with a few bubble within 3-5 cardiac cycles.  4. The mitral valve is normal in structure.  No evidence of mitral valve regurgitation.  5. The aortic valve is normal in structure. Aortic valve regurgitation is not visualized.  6. The inferior vena cava is normal in size with greater than 50% respiratory variability, suggesting right atrial pressure of 3 mmHg. Conclusion(s)/Recommendation(s): Possible small PFO; considering his age and comorbidites stroke etiology is multifactorial. Would not w/u for PFO closure. FINDINGS  Left Ventricle: Left ventricular ejection fraction, by estimation, is 60 to 65%. The left ventricle has normal function. The left ventricle has no regional wall motion abnormalities. The left ventricular internal cavity size was normal in size. There is  mild left ventricular hypertrophy. Left ventricular diastolic parameters are consistent with Grade I diastolic dysfunction (impaired relaxation). Right Ventricle: The right ventricular size is normal. Right ventricular systolic function is normal. Tricuspid regurgitation signal is inadequate for assessing PA pressure. Left Atrium: Left atrial size was normal in size. Right Atrium: Right atrial size was normal in size. Pericardium: There is no evidence of pericardial effusion. Mitral Valve: The mitral valve is normal in structure. No evidence of mitral valve regurgitation. Tricuspid Valve: Tricuspid valve regurgitation is not demonstrated. Aortic Valve: The aortic valve is normal in structure. Aortic valve regurgitation is not visualized. Aortic valve mean gradient measures 4.0 mmHg. Aortic valve peak gradient measures 7.6 mmHg. Aortic valve area, by VTI measures 2.14 cm. Pulmonic Valve: Pulmonic valve regurgitation is not visualized. Aorta: The aortic root is normal in size and structure. Venous: The inferior vena cava is normal in size with greater than 50% respiratory variability, suggesting right atrial pressure of 3 mmHg. IAS/Shunts: No atrial level shunt detected by color flow Doppler. Agitated saline contrast was given  intravenously to evaluate for intracardiac shunting.  LEFT VENTRICLE PLAX 2D LVIDd:         4.20 cm   Diastology LVIDs:         2.90 cm   LV e' medial:    7.51 cm/s LV PW:  1.00 cm   LV E/e' medial:  6.8 LV IVS:        1.10 cm   LV e' lateral:   11.40 cm/s LVOT diam:     2.10 cm   LV E/e' lateral: 4.5 LV SV:         70 LV SV Index:   42 LVOT Area:     3.46 cm  RIGHT VENTRICLE RV Basal diam:  2.80 cm RV Mid diam:    2.40 cm RV S prime:     20.80 cm/s TAPSE (M-mode): 2.5 cm LEFT ATRIUM             Index        RIGHT ATRIUM           Index LA diam:        3.90 cm 2.34 cm/m   RA Area:     10.60 cm LA Vol (A2C):   35.8 ml 21.47 ml/m  RA Volume:   20.40 ml  12.23 ml/m LA Vol (A4C):   40.0 ml 23.99 ml/m LA Biplane Vol: 39.7 ml 23.81 ml/m  AORTIC VALVE AV Area (Vmax):    2.25 cm AV Area (Vmean):   2.26 cm AV Area (VTI):     2.14 cm AV Vmax:           138.00 cm/s AV Vmean:          91.400 cm/s AV VTI:            0.325 m AV Peak Grad:      7.6 mmHg AV Mean Grad:      4.0 mmHg LVOT Vmax:         89.60 cm/s LVOT Vmean:        59.700 cm/s LVOT VTI:          0.201 m LVOT/AV VTI ratio: 0.62  AORTA Ao Root diam: 3.20 cm MITRAL VALVE MV Area (PHT): 2.12 cm    SHUNTS MV Decel Time: 357 msec    Systemic VTI:  0.20 m MV E velocity: 50.80 cm/s  Systemic Diam: 2.10 cm MV A velocity: 84.40 cm/s MV E/A ratio:  0.60 Photographer signed by Carolan Clines Signature Date/Time: 09/10/2023/3:36:31 PM    Final    CT ABDOMEN PELVIS W CONTRAST  Result Date: 09/09/2023 CLINICAL DATA:  Abdomen pain EXAM: CT ABDOMEN AND PELVIS WITH CONTRAST TECHNIQUE: Multidetector CT imaging of the abdomen and pelvis was performed using the standard protocol following bolus administration of intravenous contrast. RADIATION DOSE REDUCTION: This exam was performed according to the departmental dose-optimization program which includes automated exposure control, adjustment of the mA and/or kV according to patient size and/or use of  iterative reconstruction technique. CONTRAST:  75mL OMNIPAQUE IOHEXOL 350 MG/ML SOLN COMPARISON:  CT 09/09/2023, CT  angiography 08/08/2023, 08/07/2023 FINDINGS: Lower chest: Lung bases demonstrate no acute airspace disease. Hepatobiliary: Excreted contrast in the gallbladder. No biliary dilatation or focal hepatic abnormality Pancreas: Unremarkable. No pancreatic ductal dilatation or surrounding inflammatory changes. Spleen: Normal in size without focal abnormality. Adrenals/Urinary Tract: Adrenal glands are within normal limits. Cyst in the right kidney, no imaging follow-up is recommended. Excreted contrast within the renal collecting systems and bladder. Interim development of diffuse bladder wall thickening, perivesical stranding and gas within the wall of the bladder. Stomach/Bowel: The stomach is nonenlarged. No dilated small bowel. Suspicion of mild wall thickening and inflammation at the ileocecal junction and proximal ascending colon, series 3 image 47 through 55. Diverticular disease of  the left colon. Vascular/Lymphatic: Moderate aortic atherosclerosis. No aneurysm. No suspicious lymph nodes. Reproductive: Uterus unremarkable. Hyperdense material/contrast within the vagina. No adnexal mass Other: No free air or pelvic effusion. Small foci of gas along the bilateral pelvic sidewall, suspect that these are intravascular. Musculoskeletal: No acute or suspicious osseous abnormality. IMPRESSION: 1. Interim development of diffuse bladder wall thickening, perivesical stranding and gas within the wall of the bladder, consistent with emphysematous cystitis. 2. Suspicion of mild wall thickening and inflammation at the ileocecal junction and proximal ascending colon which may be due to distal ileitis/colitis from infection, inflammatory bowel disease, or ischemia. 3. Diverticular disease of the left colon without acute inflammatory change. 4. Hyperdense contrast within the vagina, no obvious fistula seen. Question  related to vaginal reflux. Aortic Atherosclerosis (ICD10-I70.0) and Emphysema (ICD10-J43.9). These results will be called to the ordering clinician or representative by the Radiologist Assistant, and communication documented in the PACS or [employer]. Electronically Signed   By: Jasmine Pang M.D.   On: 09/09/2023 23:36   CT ABDOMEN PELVIS WO CONTRAST  Result Date: 09/09/2023 CLINICAL DATA:  Bowel obstruction suspected. EXAM: CT ABDOMEN AND PELVIS WITHOUT CONTRAST TECHNIQUE: Multidetector CT imaging of the abdomen and pelvis was performed following the standard protocol without IV contrast. RADIATION DOSE REDUCTION: This exam was performed according to the departmental dose-optimization program which includes automated exposure control, adjustment of the mA and/or kV according to patient size and/or use of iterative reconstruction technique. COMPARISON:  CTA abdomen and pelvis 08/08/2023 FINDINGS: Lower chest: Mild atelectasis in the lung bases. Coronary atherosclerosis. Normal heart size. No pleural effusion. Hepatobiliary: No focal liver abnormality is identified on this unenhanced study. Gallbladder wall thickening is likely secondary to nondistention, without acute pericholecystic inflammation. No biliary dilatation. Pancreas: Unremarkable. Spleen: Unremarkable. Adrenals/Urinary Tract: Unremarkable adrenal glands. Excreted IV contrast material from today's earlier head and neck CTA in the renal collecting systems, ureters, and bladder. No hydronephrosis. 1.5 cm low-density lesion in the right kidney compatible with a cyst with no follow-up imaging recommended. Small amount of nondependent gas in the bladder, query recent catheterization. Stomach/Bowel: The stomach is unremarkable. Mild left-sided colonic diverticulosis is noted. There is no evidence of bowel obstruction or inflammation. Vascular/Lymphatic: Abdominal aortic atherosclerosis without aneurysm. No enlarged lymph nodes. Reproductive: Uterus  and bilateral adnexa are unremarkable. Other: No ascites or pneumoperitoneum. Small fat-containing umbilical hernia. Musculoskeletal: No suspicious osseous lesion. Widespread thoracolumbar facet arthrosis. Moderate disc degeneration about the thoracolumbar junction. IMPRESSION: 1. No acute abnormality identified in the abdomen or pelvis. 2.  Aortic Atherosclerosis (ICD10-I70.0). Electronically Signed   By: Sebastian Ache M.D.   On: 09/09/2023 08:45   MR ANGIO HEAD WO CONTRAST  Result Date: 09/09/2023 CLINICAL DATA:  Code stroke. 77 year old female last known well 2100 hours. Abnormal speech and right side deficits. Distal basilar occlusion or near occlusion by CTA. EXAM: MRA HEAD WITHOUT CONTRAST TECHNIQUE: Angiographic images of the Circle of Willis were acquired using MRA technique without intravenous contrast. COMPARISON:  CTA head and neck, brain MRI this morning. Previous intracranial MRA 12/10/2011. FINDINGS: Intermittently motion degraded exam. Anterior circulation: Antegrade flow maintained in the distal cervical ICAs and both ICA siphons. Patent carotid termini, MCA and ACA origins. Motion artifact again degrades detail of the MCA and ACA branches, and multifocal irregularity of those vessels could be artifact. But no anterior circulation large vessel occlusion is evident. Posterior circulation: Dominant right vertebral artery again noted which supplies the basilar. Distal right vertebral, right PICA and proximal basilar  arteries are patent. Mid basilar flow signal maintained although more irregular compared to the 2012 MRA. And there is absent flow signal at the basilar tip and right PCA origin concordant with the CTA peer in [place] they (series 17, image 96. Flow signal there was maintained in 2012. Likewise, SCA origins are not identified now, were visible in 2012. Right PCA flow is reconstituted as by CTA. And there is a fetal type left PCA origin again noted. Anatomic variants: Dominant right vertebral  artery supplies the basilar. Fetal type left PCA origin. Other: Brain MRI today is reported separately. IMPRESSION: 1. Confirmed Poor flow/Occlusion of the Basilar Artery tip and Right PCA origin, concordant with CTA today. This is new since 2012, and SCA origins also no longer visible by MRA or CTA. See MRI at the same time reported separately. 2. Motion degraded exam. Other evidence of intracranial atherosclerosis, but no other large vessel occlusion identified. Electronically Signed   By: Odessa Fleming M.D.   On: 09/09/2023 05:52   MR BRAIN WO CONTRAST  Result Date: 09/09/2023 CLINICAL DATA:  Code stroke. 77 year old female last known well 2100 hours. Abnormal speech and right side deficits. Distal basilar occlusion or near occlusion by CTA. EXAM: MRI HEAD WITHOUT CONTRAST TECHNIQUE: Multiplanar, multiecho pulse sequences of the brain and surrounding structures were obtained without intravenous contrast. COMPARISON:  CTA head and neck today.  Brain MRI 08/16/2022. FINDINGS: Brain: No restricted diffusion or evidence of acute infarction. Stable cerebral volume. No midline shift, mass effect, evidence of mass lesion, ventriculomegaly, extra-axial collection or acute intracranial hemorrhage. Cervicomedullary junction and pituitary are within normal limits. Patchy bilateral cerebral white matter T2 and FLAIR hyperintensity which is most pronounced in the periatrial regions appears stable from last year. Motion artifact in the posterior fossa on FLAIR imaging today. But T1 and T2 signal in the brainstem and cerebellum appears stable from last year, within normal limits for age. No chronic cerebral blood products on SWI. Vascular: Major intracranial vascular flow voids do appear stable compared to the 2023 MRI, including diminutive and irregular appearance of the distal basilar artery on series 13, image 10. Skull and upper cervical spine: Stable, negative. Sinuses/Orbits: Chronic paranasal sinus disease. Stable  postoperative appearance of the orbits. Other: Mastoids remain well aerated. Visible internal auditory structures appear normal. Negative visible scalp and face. IMPRESSION: 1. No evidence of acute ischemia and essentially stable noncontrast MRI appearance of the brain since last year. This was discussed by telephone with Dr. Erick Blinks on 09/09/2023 at 0515 hours. 2. MRA is reported separately. Electronically Signed   By: Odessa Fleming M.D.   On: 09/09/2023 05:42   CT ANGIO HEAD NECK W WO CM (CODE STROKE)  Result Date: 09/09/2023 CLINICAL DATA:  Code stroke. 77 year old female last known well 2100 hours. Abnormal speech and right side deficits. EXAM: CT ANGIOGRAPHY HEAD AND NECK WITH AND WITHOUT CONTRAST TECHNIQUE: Multidetector CT imaging of the head and neck was performed using the standard protocol during bolus administration of intravenous contrast. Multiplanar CT image reconstructions and MIPs were obtained to evaluate the vascular anatomy. Carotid stenosis measurements (when applicable) are obtained utilizing NASCET criteria, using the distal internal carotid diameter as the denominator. RADIATION DOSE REDUCTION: This exam was performed according to the departmental dose-optimization program which includes automated exposure control, adjustment of the mA and/or kV according to patient size and/or use of iterative reconstruction technique. CONTRAST:  75mL OMNIPAQUE IOHEXOL 350 MG/ML SOLN COMPARISON:  Plain head CT 0436 hours today. Prior  CTA neck 07/01/2020. Previous brain MRA 12/10/2011. And brain MRI last year 08/16/2022. FINDINGS: CTA NECK Skeleton: Chronic paranasal sinus disease. Chronic cervical spine degeneration. Facet arthropathy and C4-C5 facet ankylosis on the right side. Bilateral cervicothoracic facet ankylosis. No acute osseous abnormality identified. Upper chest: Negative. Other neck: No acute finding. Aortic arch: Calcified aortic atherosclerosis.  3 vessel arch. Right carotid system: No  significant brachiocephalic artery or right CCA origin plaque or stenosis. Tortuous proximal right CCA. Bulky soft and calcified plaque along the medial vessel proximal to the bifurcation. Up to 50 % stenosis with respect to the distal vessel (series 7, image 113). Bulky calcified plaque at the right ICA origin and bulb with high-grade stenosis numerically estimated at 69 % with respect to the distal vessel (series 5, image 201). The vessel remains patent to the skull base. This does not appear significantly changed since 2021. Left carotid system: Left CCA origin plaque without stenosis. Patent stent beginning in the distal left CCA and continuing through the ICA, with substantially improved flow there compared to the 2021 CTA. No significant stenosis to the skull base. Vertebral arteries: Proximal right subclavian artery calcified plaque without stenosis. Normal right vertebral artery origin. Dominant appearing right vertebral artery is patent and mildly tortuous to the skull base without stenosis. Proximal left subclavian artery plaque and also motion artifact. No high-grade proximal subclavian artery stenosis. Left vertebral artery is non dominant and highly diminutive. There is chronic calcified plaque at its origin. Diminutive left vertebral artery appears stable since 2021, remains patent although diminutive to the skull base. CTA HEAD Posterior circulation: Dominant right vertebral V4 segment with normal PICA origins supplies the proximal basilar. Diminutive left vertebral artery functionally terminates in PICA as before. Proximal basilar artery is patent, but there is distal basilar stenosis or thrombosis along a segment of 3-4 mm as seen on series 10, image 26. This level was not included on the 2021 CTA, but was patent on the 2012 MR a. there is reconstitution of the right PCA origin, with diminutive or absent right posterior communicating artery on that side. There is a contralateral fetal type left PCA  origin. Bilateral PCA branches remain patent with mild irregularity. SCA origins are not identified. Anterior circulation: Both ICA siphons are patent. Both ICA siphons are calcified. On the left there is mild to moderate siphon stenosis maximal in the supraclinoid segment. On the right there is also mild to moderate supraclinoid stenosis. Normal left posterior communicating artery origin. Patent carotid termini. Dominant appearing right ACA A1, similar to 2012 MRA. Normal anterior communicating artery. Patent MCA origins. Bilateral MCA and ACA branch detail is motion degraded. Both MCA bifurcations appear to remain patent. But MCA and ACA branch detail is otherwise limited. Delayed phase images were obtained (series 14), and seem to demonstrate fairly symmetric A2 and M2 enhancement. Venous sinuses: Patent on the delayed images. Anatomic variants: Chronically dominant right and diminutive left vertebral arteries. Left vertebral functionally terminates in PICA. Fetal type left PCA origin. Dominant right ACA A1. Review of the MIP images confirms the above findings Preliminary report of this exam was discussed by telephone with Dr. Tollie Eth on 09/09/2023 at 04:50 . IMPRESSION: 1. 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 Nonvisualization of the distal 3-4 mm of the Basilar, new since 2012. Involvement of the right PCA origin, but right PCA is reconstituted. SCAs are not identified. Stat Brain MRI being obtained for further evaluation, see that report. This was discussed by telephone with  Dr. Tollie Eth on 09/09/2023 at 04:50. 2. Motion degraded MCA and ACA branches with no other strong evidence of ELVO. 3. Substantially improved cervical Left ICA following stenting. High-grade contralateral proximal right ICA stenosis estimated at 69%, not significantly changed from 2021. Calcified ICA siphons with mild to moderate bilateral siphon stenosis. 4. Aortic Atherosclerosis (ICD10-I70.0). Cervical  spine degeneration and facet ankylosis. Electronically Signed   By: Odessa Fleming M.D.   On: 09/09/2023 05:26   DG Chest Port 1 View  Result Date: 09/09/2023 CLINICAL DATA:  Code stroke. 77 year old female last known well 2100 hours. Abnormal speech and right side deficits. EXAM: PORTABLE CHEST 1 VIEW COMPARISON:  CTA neck today.  Portable chest 08/15/2022. FINDINGS: Portable AP semi upright view at 0418 hours. Lower lung volumes compared to last year. Platelike left lung base opacity. But elsewhere Allowing for portable technique the lungs are clear. Upper lungs appeared clear on CTA today. Normal cardiac size and mediastinal contours. Visualized tracheal air column is within normal limits. No acute osseous abnormality identified. Paucity of bowel gas. IMPRESSION: Lower lung volumes with basilar atelectasis, especially on the left. Electronically Signed   By: Odessa Fleming M.D.   On: 09/09/2023 04:52   CT Head Code Stroke WO Contrast  Result Date: 09/09/2023 CLINICAL DATA:  Code stroke. 77 year old female last known well 2100 hours. Abnormal speech and right side deficits. EXAM: CT HEAD WITHOUT CONTRAST TECHNIQUE: Contiguous axial images were obtained from the base of the skull through the vertex without intravenous contrast. RADIATION DOSE REDUCTION: This exam was performed according to the departmental dose-optimization program which includes automated exposure control, adjustment of the mA and/or kV according to patient size and/or use of iterative reconstruction technique. COMPARISON:  Brain MRI 08/16/2022.  Head CT 08/15/2022. FINDINGS: Brain: Stable cerebral volume, normal for age. Stable gray-white matter differentiation throughout the brain. No midline shift, ventriculomegaly, mass effect, evidence of mass lesion, intracranial hemorrhage or evidence of cortically based acute infarction. Vascular: Calcified atherosclerosis at the skull base. No suspicious intracranial vascular hyperdensity. Skull: Stable.  No  acute osseous abnormality identified. 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 Chronic right ethmoidectomy. Tympanic cavities and mastoids remain clear. Other: No gaze deviation. Visualized scalp soft tissues are within normal limits. ASPECTS [facility] Stroke Program Early CT Score) Total score (0-10 with 10 being normal): 10 IMPRESSION: 1. Stable and negative for age noncontrast CT appearance of the brain. ASPECTS 10. 2. The above communicated to Dr. Derry Lory at 4:44 am on 09/09/2023 by text page via the [facility] messaging system. 3. Chronic paranasal sinus disease. Electronically Signed   By: Odessa Fleming M.D.   On: 09/09/2023 04:45     Assessment/Plan   Essential hypertension blood pressure control important in reducing the progression of atherosclerotic disease. On appropriate oral medications.     DM (diabetes mellitus), type 2, uncontrolled w/ophthalmic complication (HCC) blood glucose control important in reducing the progression of atherosclerotic disease. Also, involved in wound healing. On appropriate medications.   Hyperlipidemia lipid control important in reducing the progression of atherosclerotic disease. Continue statin therapy  Carotid stenosis The patient had urgent evaluation and treatment at [facility] for basilar artery stenosis with symptoms.  Was seen by neuro interventionalists.  Doing well now.  Carotid stent appears widely patent.  Will follow-up in 1 to 2 months with a follow-up duplex on her carotid arteries.  SMA stenosis Recent imaging studies as part of her extensive workup demonstrated  SMA stenosis.  She does not have unintentional weight loss or food fear.  She does not have typical postprandial abdominal pain.  She has had extensive diarrhea but had C. difficile colitis and has been recovering from that.  No immediate need for revascularization or signs of acute mesenteric ischemia.  We will  do a mesenteric duplex in 1 to 2 months in follow-up unless worsening acute symptoms develop in the interim.  Festus Barren, MD  09/27/2023 11:21 AM    This note was created with Dragon medical transcription system.  Any errors from dictation are purely unintentional

## 2023-09-30 DIAGNOSIS — K551 Chronic vascular disorders of intestine: Secondary | ICD-10-CM | POA: Insufficient documentation

## 2023-10-15 ENCOUNTER — Other Ambulatory Visit (HOSPITAL_BASED_OUTPATIENT_CLINIC_OR_DEPARTMENT_OTHER): Payer: Self-pay

## 2023-11-30 ENCOUNTER — Ambulatory Visit (INDEPENDENT_AMBULATORY_CARE_PROVIDER_SITE_OTHER): Payer: Medicare HMO

## 2023-11-30 ENCOUNTER — Ambulatory Visit (INDEPENDENT_AMBULATORY_CARE_PROVIDER_SITE_OTHER): Payer: Medicare HMO | Admitting: Nurse Practitioner

## 2023-11-30 VITALS — BP 121/64 | HR 69 | Resp 16 | Wt 149.6 lb

## 2023-11-30 DIAGNOSIS — K551 Chronic vascular disorders of intestine: Secondary | ICD-10-CM

## 2023-11-30 DIAGNOSIS — I10 Essential (primary) hypertension: Secondary | ICD-10-CM | POA: Diagnosis not present

## 2023-11-30 DIAGNOSIS — I6523 Occlusion and stenosis of bilateral carotid arteries: Secondary | ICD-10-CM | POA: Diagnosis not present

## 2023-12-05 ENCOUNTER — Encounter (INDEPENDENT_AMBULATORY_CARE_PROVIDER_SITE_OTHER): Payer: Self-pay | Admitting: Nurse Practitioner

## 2023-12-05 NOTE — Progress Notes (Signed)
Subjective:    Patient ID: [name], female    DOB: March 06, 1946, 77 y.o.   MRN: [id number] Chief Complaint  Patient presents with   Follow-up    2 month mesenteric and carotid follow up    Patient returns today regarding her carotid artery disease as well as mesenteric stenosis.  She had a left carotid stent placed in 2021.  She currently denies any neurological deficits.  No alterations fugax or TIA-like symptoms.  Additionally she notes that she is eating well and denies any food phobia.  She denies any postprandial abdominal pain, nausea or vomiting.  Today she has 1 to 39% stenosis of the left ICA with 40 to 59% stenosis of the right.  Antegrade flow in the bilateral vertebral arteries with normal flow hemodynamics in the bilateral subclavian arteries.  Her mesenteric duplex reveals 70 to 99% stenosis in the SMA and celiac artery.      Review of Systems  Gastrointestinal:  Negative for abdominal pain, nausea and vomiting.  All other systems reviewed and are negative.      Objective:   Physical Exam Vitals reviewed.  HENT:     Head: Normocephalic.  Cardiovascular:     Rate and Rhythm: Normal rate.     Pulses: Normal pulses.  Pulmonary:     Effort: Pulmonary effort is normal.  Abdominal:     General: Bowel sounds are normal.  Skin:    General: Skin is warm and dry.  Neurological:     Mental Status: She is alert and oriented to person, place, and time.  Psychiatric:        Mood and Affect: Mood normal.        Behavior: Behavior normal.        Thought Content: Thought content normal.        Judgment: Judgment normal.     BP 121/64   Pulse 69   Resp 16   Wt 149 lb 9.6 oz (67.9 kg)   BMI 27.36 kg/m   Past Medical History:  Diagnosis Date   Alcohol abuse    CAP (community acquired pneumonia) 05/05/2018   Carotid artery stenosis    Depression    Diabetes mellitus    Diverticulitis    Essential hypertension 08/20/2007   Qualifier: Diagnosis of   By: Jonny Ruiz  MD, Len Blalock        Fibromyalgia    Hepatitis 08/28/2014   Hyperlipidemia 08/20/2007   Qualifier: Diagnosis of   By: Jonny Ruiz MD, Len Blalock        Hypertension    Myalgia and myositis 03/28/2012    Social History   Socioeconomic History   Marital status: Married    Spouse name: Not on file   Number of children: Not on file   Years of education: Not on file   Highest education level: Not on file  Occupational History   Not on file  Tobacco Use   Smoking status: Every Day    Current packs/day: 0.00    Types: Cigarettes, E-cigarettes    Last attempt to quit: 03/11/2010    Years since quitting: 13.7   Smokeless tobacco: Never  Vaping Use   Vaping status: Former  Substance and Sexual Activity   Alcohol use: Not Currently   Drug use: No    Comment: Pt denies    Sexual activity: Never  Other Topics Concern   Not on file  Social History Narrative   Not on file   Social Drivers  of Health   Financial Resource Strain: Low Risk  (11/25/2023)   Received from [facility]   Overall Financial Resource Strain (CARDIA)    Difficulty of Paying Living Expenses: Not hard at all  Food Insecurity: No Food Insecurity (11/25/2023)   Received from [facility]   Hunger Vital Sign    Worried About Running Out of Food in the Last Year: Never true    Ran Out of Food in the Last Year: Never true  Transportation Needs: No Transportation Needs (11/25/2023)   Received from [facility]    In the past 12 months, has lack of transportation kept you from medical appointments or from getting medications?: No    Lack of Transportation (Non-Medical): No  Physical Activity: Not on file  Stress: Not on file  Social Connections: Not on file  Intimate Partner Violence: Not At Risk (09/09/2023)   Humiliation, Afraid, Rape, and Kick questionnaire    Fear of Current or Ex-Partner: No    Emotionally Abused: No    Physically Abused:  No    Sexually Abused: No    Past Surgical History:  Procedure Laterality Date   BUNIONECTOMY     CAROTID PTA/STENT INTERVENTION Left 08/07/2020   Procedure: CAROTID PTA/STENT INTERVENTION;  Surgeon: Annice Needy, MD;  Location: [facility] INVASIVE CV LAB;  Service: Cardiovascular;  Laterality: Left;   CESAREAN SECTION     COLONOSCOPY     COLONOSCOPY WITH ESOPHAGOGASTRODUODENOSCOPY (EGD)     COLONOSCOPY WITH PROPOFOL N/A 02/18/2021   Procedure: COLONOSCOPY WITH PROPOFOL;  Surgeon: Toledo, Boykin Nearing, MD;  Location: [facility] ENDOSCOPY;  Service: Gastroenterology;  Laterality: N/A;   ESOPHAGOGASTRODUODENOSCOPY (EGD) WITH PROPOFOL N/A 02/18/2021   Procedure: ESOPHAGOGASTRODUODENOSCOPY (EGD) WITH PROPOFOL;  Surgeon: Toledo, Boykin Nearing, MD;  Location: [facility] ENDOSCOPY;  Service: Gastroenterology;  Laterality: N/A;   IR ANGIO INTRA EXTRACRAN SEL COM CAROTID INNOMINATE BILAT MOD SED  09/09/2023   IR ANGIO VERTEBRAL SEL VERTEBRAL BILAT MOD SED  09/09/2023   IR ANGIO VERTEBRAL SEL VERTEBRAL UNI R MOD SED  09/19/2023   RADIOLOGY WITH ANESTHESIA N/A 09/19/2023   Procedure: ANGIOPLASTY/STENT OF BASILAR ARTERY WITH ANESTHESIA;  Surgeon: Julieanne Cotton, MD;  Location: [facility] OR;  Service: Radiology;  Laterality: N/A;   TONSILLECTOMY      Family History  Problem Relation Age of Onset   Cancer Mother    Heart disease Father    Heart disease Sister     Allergies  Allergen Reactions   Diflucan [Fluconazole] Nausea And Vomiting   Latex Hives   Lipitor [Atorvastatin Calcium] Other (See Comments)    Muscle aches   Codeine Rash    And nausea    Pentazocine Lactate Rash   Sulfonamide Derivatives Rash       Latest Ref Rng & Units 09/16/2023    7:03 AM 09/15/2023    7:45 AM 09/14/2023    2:56 PM  CBC  WBC 4.0 - 10.5 K/uL 4.2  4.0  4.8   Hemoglobin 12.0 - 15.0 g/dL 9.9  9.8  40.9   Hematocrit 36.0 - 46.0 % 32.2  31.4  33.3   Platelets 150 - 400 K/uL 251  254  304       CMP     Component Value Date/Time   NA  136 09/20/2023 0431   K 4.1 09/20/2023 0431   CL 107 09/20/2023 0431   CO2 21 (L) 09/20/2023 0431   GLUCOSE 143 (H)  09/20/2023 0431   GLUCOSE 123 (H) 11/17/2006 1240   BUN 13 09/20/2023 0431   CREATININE 0.68 09/20/2023 0431   CREATININE 0.81 09/16/2014 1759   CALCIUM 8.2 (L) 09/20/2023 0431   PROT 5.2 (L) 09/20/2023 0431   ALBUMIN 2.8 (L) 09/20/2023 0431   AST 22 09/20/2023 0431   ALT 25 09/20/2023 0431   ALKPHOS 60 09/20/2023 0431   BILITOT 0.6 09/20/2023 0431   GFRNONAA >60 09/20/2023 0431   GFRNONAA 76 09/16/2014 1759     No results found.     Assessment & Plan:   1. Bilateral carotid artery stenosis (Primary) Recommend:  Given the patient's asymptomatic subcritical stenosis no further invasive testing or surgery at this time.  Duplex ultrasound shows 1-39% of LICA and 40-59% of RICA stenosis bilaterally.  Continue antiplatelet therapy as prescribed Continue management of CAD, HTN and Hyperlipidemia Healthy heart diet,  encouraged exercise at least 4 times per week  Follow up in 12 months with duplex ultrasound and physical exam   2. Essential hypertension Continue antihypertensive medications as already ordered, these medications have been reviewed and there are no changes at this time.  3. Superior mesenteric artery stenosis (HCC) Recommend:  The patient has evidence of chronic asymptomatic mesenteric atherosclerosis.  The patient denies lifestyle limiting changes at this point in time.  Given the lack of symptoms no intervention is warranted at this time.   No further invasive studies, angiography or surgery at this time  Patient is advised of symptoms including photophobia, postprandial abdominal pain, postprandial nausea and vomiting, which should prompt her to contact us for sooner follow-up  Patient should undergo noninvasive studies as ordered. The patient will follow up with me after the studies.    Current Outpatient Medications on File Prior to  Visit  Medication Sig Dispense Refill   amphetamine-dextroamphetamine (ADDERALL XR) 20 MG 24 hr capsule Take 2 capsules (40 mg total) by mouth every morning. Discuss with neurology before resuming this medicine in the setting of your mini stroke (don't take until you discuss with neurology)     aspirin EC 81 MG EC tablet Take 1 tablet (81 mg total) by mouth daily. Swallow whole. 90 tablet 3   B Complex Vitamins (B COMPLEX-B12) TABS Take 1 tablet by mouth at bedtime.     BYSTOLIC 5 MG tablet TAKE 1 TABLET DAILY (Patient taking differently: Take 5 mg by mouth daily.) 90 tablet 0   diphenoxylate-atropine (LOMOTIL) 2.5-0.025 MG tablet Take 1 tablet by mouth 4 (four) times daily as needed for diarrhea or loose stools. Avoid use until you complete therapy for your C diff infection (until completion of oral vancomycin)     Dulaglutide (TRULICITY) 3 MG/0.5ML SOAJ Inject 3 mg into the skin once a week. 6 mL 1   EPINEPHrine 0.3 mg/0.3 mL IJ SOAJ injection Inject 0.3 mg into the muscle as needed for anaphylaxis.     glucose blood (BAYER CONTOUR NEXT TEST) test strip Use to test blood sugar 3 times daily as instructed. Dx code: E11.65 100 each 11   Lancets (ACCU-CHEK MULTICLIX) lancets Use to test blood sugar 3 times daily as instructed. Dx: E11.65 100 each 11   lisinopril (ZESTRIL) 10 MG tablet Take 1 tablet (10 mg total) by mouth daily. Follow your blood pressures with your outpatient provider for refills 90 tablet 0   metFORMIN (GLUCOPHAGE) 1000 MG tablet Take 1,000 mg by mouth every evening.     omeprazole (PRILOSEC) 40 MG capsule Take 40 mg by  mouth daily.  2   pregabalin (LYRICA) 300 MG capsule Take 300 mg by mouth 2 (two) times daily.     rosuvastatin (CRESTOR) 20 MG tablet Take 1 tablet (20 mg total) by mouth daily. 90 tablet 0   sitaGLIPtin (JANUVIA) 100 MG tablet Take 100 mg by mouth daily.     ticagrelor (BRILINTA) 90 MG TABS tablet Take 1 tablet (90 mg total) by mouth 2 (two) times daily. Follow up  with neurology for refills 180 tablet 0   No current facility-administered medications on file prior to visit.    There are no Patient Instructions on file for this visit. No follow-ups on file.   Georgiana Spinner, NP

## 2023-12-27 DIAGNOSIS — R519 Headache, unspecified: Secondary | ICD-10-CM | POA: Diagnosis not present

## 2023-12-27 DIAGNOSIS — E1139 Type 2 diabetes mellitus with other diabetic ophthalmic complication: Secondary | ICD-10-CM | POA: Diagnosis not present

## 2023-12-27 DIAGNOSIS — G459 Transient cerebral ischemic attack, unspecified: Secondary | ICD-10-CM | POA: Diagnosis not present

## 2023-12-27 DIAGNOSIS — Z79899 Other long term (current) drug therapy: Secondary | ICD-10-CM | POA: Diagnosis not present

## 2023-12-27 DIAGNOSIS — R2990 Unspecified symptoms and signs involving the nervous system: Secondary | ICD-10-CM | POA: Diagnosis not present

## 2024-02-09 DIAGNOSIS — Z79899 Other long term (current) drug therapy: Secondary | ICD-10-CM | POA: Diagnosis not present

## 2024-02-22 DIAGNOSIS — I447 Left bundle-branch block, unspecified: Secondary | ICD-10-CM | POA: Diagnosis not present

## 2024-02-22 DIAGNOSIS — I10 Essential (primary) hypertension: Secondary | ICD-10-CM | POA: Diagnosis not present

## 2024-02-22 DIAGNOSIS — R079 Chest pain, unspecified: Secondary | ICD-10-CM | POA: Diagnosis not present

## 2024-02-22 DIAGNOSIS — G459 Transient cerebral ischemic attack, unspecified: Secondary | ICD-10-CM | POA: Diagnosis not present

## 2024-02-22 DIAGNOSIS — E7800 Pure hypercholesterolemia, unspecified: Secondary | ICD-10-CM | POA: Diagnosis not present

## 2024-02-22 DIAGNOSIS — I779 Disorder of arteries and arterioles, unspecified: Secondary | ICD-10-CM | POA: Diagnosis not present

## 2024-03-20 DIAGNOSIS — Z79899 Other long term (current) drug therapy: Secondary | ICD-10-CM | POA: Diagnosis not present

## 2024-03-20 DIAGNOSIS — Z0000 Encounter for general adult medical examination without abnormal findings: Secondary | ICD-10-CM | POA: Diagnosis not present

## 2024-03-20 DIAGNOSIS — N1832 Chronic kidney disease, stage 3b: Secondary | ICD-10-CM | POA: Diagnosis not present

## 2024-03-20 DIAGNOSIS — F1021 Alcohol dependence, in remission: Secondary | ICD-10-CM | POA: Diagnosis not present

## 2024-03-20 DIAGNOSIS — I10 Essential (primary) hypertension: Secondary | ICD-10-CM | POA: Diagnosis not present

## 2024-03-20 DIAGNOSIS — F419 Anxiety disorder, unspecified: Secondary | ICD-10-CM | POA: Diagnosis not present

## 2024-03-20 DIAGNOSIS — G720 Drug-induced myopathy: Secondary | ICD-10-CM | POA: Diagnosis not present

## 2024-03-20 DIAGNOSIS — E118 Type 2 diabetes mellitus with unspecified complications: Secondary | ICD-10-CM | POA: Diagnosis not present

## 2024-03-20 DIAGNOSIS — E7800 Pure hypercholesterolemia, unspecified: Secondary | ICD-10-CM | POA: Diagnosis not present

## 2024-03-20 DIAGNOSIS — T466X5A Adverse effect of antihyperlipidemic and antiarteriosclerotic drugs, initial encounter: Secondary | ICD-10-CM | POA: Diagnosis not present

## 2024-03-20 DIAGNOSIS — F32A Depression, unspecified: Secondary | ICD-10-CM | POA: Diagnosis not present

## 2024-05-01 ENCOUNTER — Encounter (INDEPENDENT_AMBULATORY_CARE_PROVIDER_SITE_OTHER): Payer: Self-pay

## 2024-05-23 DIAGNOSIS — R519 Headache, unspecified: Secondary | ICD-10-CM | POA: Diagnosis not present

## 2024-05-23 DIAGNOSIS — M542 Cervicalgia: Secondary | ICD-10-CM | POA: Diagnosis not present

## 2024-05-23 DIAGNOSIS — R32 Unspecified urinary incontinence: Secondary | ICD-10-CM | POA: Diagnosis not present

## 2024-05-23 DIAGNOSIS — R2990 Unspecified symptoms and signs involving the nervous system: Secondary | ICD-10-CM | POA: Diagnosis not present

## 2024-05-23 DIAGNOSIS — G459 Transient cerebral ischemic attack, unspecified: Secondary | ICD-10-CM | POA: Diagnosis not present

## 2024-05-23 DIAGNOSIS — R2689 Other abnormalities of gait and mobility: Secondary | ICD-10-CM | POA: Diagnosis not present

## 2024-05-23 DIAGNOSIS — N390 Urinary tract infection, site not specified: Secondary | ICD-10-CM | POA: Diagnosis not present

## 2024-05-31 ENCOUNTER — Other Ambulatory Visit (INDEPENDENT_AMBULATORY_CARE_PROVIDER_SITE_OTHER): Payer: Self-pay | Admitting: Vascular Surgery

## 2024-05-31 DIAGNOSIS — K551 Chronic vascular disorders of intestine: Secondary | ICD-10-CM

## 2024-06-01 ENCOUNTER — Ambulatory Visit (INDEPENDENT_AMBULATORY_CARE_PROVIDER_SITE_OTHER): Payer: Medicare HMO | Admitting: Vascular Surgery

## 2024-06-01 ENCOUNTER — Other Ambulatory Visit (INDEPENDENT_AMBULATORY_CARE_PROVIDER_SITE_OTHER): Payer: Medicare HMO

## 2024-06-01 ENCOUNTER — Encounter (INDEPENDENT_AMBULATORY_CARE_PROVIDER_SITE_OTHER): Payer: Self-pay | Admitting: Vascular Surgery

## 2024-06-01 VITALS — BP 128/61 | HR 69 | Resp 18 | Ht 62.0 in | Wt 152.6 lb

## 2024-06-01 DIAGNOSIS — E785 Hyperlipidemia, unspecified: Secondary | ICD-10-CM

## 2024-06-01 DIAGNOSIS — K551 Chronic vascular disorders of intestine: Secondary | ICD-10-CM | POA: Diagnosis not present

## 2024-06-01 DIAGNOSIS — I10 Essential (primary) hypertension: Secondary | ICD-10-CM

## 2024-06-04 NOTE — Progress Notes (Signed)
 Subjective:    Patient ID: [name], female    DOB: [birth year], 78 y.o.   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 She notes that she is eating well and denies any food phobia.  She denies any postprandial abdominal pain, nausea or vomiting.  Her mesenteric duplex reveals 70 to 99% stenosis in the SMA and celiac artery. This is unchanged from prior studies.   Patient is questioning for long-term use of aspirin  and Brilinta .  She states that she needs to have cosmetic surgery done, did not specify what type of cosmetic surgery, and they requested that she be off any type of anticoagulation or antiplatelet therapy.  She has a history of neurologic bleeding and she endorses today that neurologist said it would be okay for her to be off these medications for this procedure.  I agree with neurosurgery that a short-term discontinue these medications for cosmetic surgery would be allowed such as 3 to 5 days but she would need to return to taking them immediately.  We discussed in detail today the side effects and the complications related to stopping this medication based on her carotid stenosis as well as her mesenteric stenosis.  She verbalized her understanding and endorses she would have to think about proceeding with cosmetic surgery.    Review of Systems  Constitutional: Negative.   All other systems reviewed and are negative.      Objective:   Physical Exam Vitals reviewed.  Constitutional:      Appearance: Normal appearance. She is normal weight.  HENT:     Head: Normocephalic.   Eyes:     Pupils: Pupils are equal, round, and reactive to light.    Cardiovascular:     Rate and Rhythm: Normal rate and regular rhythm.     Pulses: Normal pulses.     Heart sounds: Normal heart sounds.  Pulmonary:     Effort: Pulmonary effort is  normal.     Breath sounds: Normal breath sounds.  Abdominal:     General: Abdomen is flat.     Palpations: Abdomen is soft.   Musculoskeletal:        General: Normal range of motion.   Skin:    General: Skin is warm and dry.     Capillary Refill: Capillary refill takes 2 to 3 seconds.   Neurological:     General: No focal deficit present.     Mental Status: She is alert and oriented to person, place, and time. Mental status is at baseline.   Psychiatric:        Mood and Affect: Mood normal.        Behavior: Behavior normal.        Thought Content: Thought content normal.        Judgment: Judgment normal.     BP 128/61 (BP Location: Right Arm, Patient Position: Sitting, Cuff Size: Normal)   Pulse 69   Resp 18   Ht 5' 2 (1.575 m)   Wt 152 lb 9.6 oz (69.2 kg)   BMI 27.91 kg/m   Past Medical History:  Diagnosis Date   Alcohol  abuse    CAP (community acquired pneumonia) 05/05/2018   Carotid artery stenosis    Depression    Diabetes mellitus    Diverticulitis    Essential hypertension 08/20/2007  Qualifier: Diagnosis of   By: Norleen MD, [place] ORN        Fibromyalgia    Hepatitis 08/28/2014   Hyperlipidemia 08/20/2007   Qualifier: Diagnosis of   By: Norleen MD, [place] ORN        Hypertension    Myalgia and myositis 03/28/2012    Social History   Socioeconomic History   Marital status: Married    Spouse name: Not on file   Number of children: Not on file   Years of education: Not on file   Highest education level: Not on file  Occupational History   Not on file  Tobacco Use   Smoking status: Every Day    Current packs/day: 0.00    Types: Cigarettes, E-cigarettes    Last attempt to quit: 03/11/2010    Years since quitting: 14.2   Smokeless tobacco: Never  Vaping Use   Vaping status: Former  Substance and Sexual Activity   Alcohol  use: Not Currently   Drug use: No    Comment: Pt denies    Sexual activity: Never  Other Topics Concern   Not on file  Social  History Narrative   Not on file   Social Drivers of Health   Financial Resource Strain: Low Risk  (11/25/2023)   Received from [facility]   Overall Financial Resource Strain (CARDIA)    Difficulty of Paying Living Expenses: Not hard at all  Food Insecurity: No Food Insecurity (11/25/2023)   Received from [facility]   Hunger Vital Sign    Within the past 12 months, you worried that your food would run out before you got the money to buy more.: Never true    Within the past 12 months, the food you bought just didn't last and you didn't have money to get more.: Never true  Transportation Needs: No Transportation Needs (11/25/2023)   Received from [facility] - Transportation    In the past 12 months, has lack of transportation kept you from medical appointments or from getting medications?: No    Lack of Transportation (Non-Medical): No  Physical Activity: Not on file  Stress: Not on file  Social Connections: Not on file  Intimate Partner Violence: Not At Risk (09/09/2023)   Humiliation, Afraid, Rape, and Kick questionnaire    Fear of Current or Ex-Partner: No    Emotionally Abused: No    Physically Abused: No    Sexually Abused: No    Past Surgical History:  Procedure Laterality Date   BUNIONECTOMY     CAROTID PTA/STENT INTERVENTION Left 08/07/2020   Procedure: CAROTID PTA/STENT INTERVENTION;  Surgeon: Marea Selinda RAMAN, MD;  Location: [facility] INVASIVE CV LAB;  Service: Cardiovascular;  Laterality: Left;   CESAREAN SECTION     COLONOSCOPY     COLONOSCOPY WITH ESOPHAGOGASTRODUODENOSCOPY (EGD)     COLONOSCOPY WITH PROPOFOL  N/A 02/18/2021   Procedure: COLONOSCOPY WITH PROPOFOL ;  Surgeon: Toledo, Ladell POUR, MD;  Location: [facility] ENDOSCOPY;  Service: Gastroenterology;  Laterality: N/A;   ESOPHAGOGASTRODUODENOSCOPY (EGD) WITH PROPOFOL  N/A 02/18/2021   Procedure: ESOPHAGOGASTRODUODENOSCOPY (EGD) WITH PROPOFOL ;  Surgeon: Toledo, Ladell POUR, MD;  Location: [facility] ENDOSCOPY;  Service: Gastroenterology;  Laterality: N/A;   IR ANGIO INTRA EXTRACRAN SEL COM CAROTID INNOMINATE BILAT MOD SED  09/09/2023   IR ANGIO VERTEBRAL SEL VERTEBRAL BILAT MOD SED  09/09/2023   IR ANGIO VERTEBRAL SEL VERTEBRAL UNI R MOD SED  09/19/2023   RADIOLOGY WITH ANESTHESIA N/A 09/19/2023  Procedure: ANGIOPLASTY/STENT OF BASILAR ARTERY WITH ANESTHESIA;  Surgeon: Dolphus Carrion, MD;  Location: [facility] OR;  Service: Radiology;  Laterality: N/A;   TONSILLECTOMY      Family History  Problem Relation Age of Onset   Cancer Mother    Heart disease Father    Heart disease Sister     Allergies  Allergen Reactions   Diflucan [Fluconazole] Nausea And Vomiting   Latex Hives   Lipitor [Atorvastatin  Calcium ] Other (See Comments)    Muscle aches   Codeine Rash    And nausea    Pentazocine Lactate Rash   Sulfonamide Derivatives Rash       Latest Ref Rng & Units 09/16/2023    7:03 AM 09/15/2023    7:45 AM 09/14/2023    2:56 PM  CBC  WBC 4.0 - 10.5 K/uL 4.2  4.0  4.8   Hemoglobin 12.0 - 15.0 g/dL 9.9  9.8  89.6   Hematocrit 36.0 - 46.0 % 32.2  31.4  33.3   Platelets 150 - 400 K/uL 251  254  304       CMP     Component Value Date/Time   NA 136 09/20/2023 0431   K 4.1 09/20/2023 0431   CL 107 09/20/2023 0431   CO2 21 (L) 09/20/2023 0431   GLUCOSE 143 (H) 09/20/2023 0431   GLUCOSE 123 (H) 11/17/2006 1240   BUN 13 09/20/2023 0431   CREATININE 0.68 09/20/2023 0431   CREATININE 0.81 09/16/2014 1759   CALCIUM  8.2 (L) 09/20/2023 0431   PROT 5.2 (L) 09/20/2023 0431   ALBUMIN 2.8 (L) 09/20/2023 0431   AST 22 09/20/2023 0431   ALT 25 09/20/2023 0431   ALKPHOS 60 09/20/2023 0431   BILITOT 0.6 09/20/2023 0431   GFRNONAA >60 09/20/2023 0431   GFRNONAA 76 09/16/2014 1759     No results found.     Assessment & Plan:   1. Superior mesenteric artery stenosis (HCC) (Primary) Recommend:   The patient has evidence of chronic asymptomatic mesenteric  atherosclerosis.  The patient denies lifestyle limiting changes at this point in time.  Given the lack of symptoms no intervention is warranted at this time.    No further invasive studies, angiography or surgery at this time   Patient is advised of symptoms including photophobia, postprandial abdominal pain, postprandial nausea and vomiting, which should prompt her to contact us  for sooner follow-up   Patient should undergo noninvasive studies as ordered. The patient will follow up with me after the studies.   2. Essential hypertension Continue antihypertensive medications as already ordered, these medications have been reviewed and there are no changes at this time.  3. Hyperlipidemia, unspecified hyperlipidemia type Continue statin as ordered and reviewed, no changes at this time   Current Outpatient Medications on File Prior to Visit  Medication Sig Dispense Refill   amphetamine -dextroamphetamine (ADDERALL XR) 20 MG 24 hr capsule Take 2 capsules (40 mg total) by mouth every morning. Discuss with neurology before resuming this medicine in the setting of your mini stroke (don't take until you discuss with neurology)     aspirin  EC 81 MG EC tablet Take 1 tablet (81 mg total) by mouth daily. Swallow whole. 90 tablet 3   B Complex Vitamins (B COMPLEX-B12) TABS Take 1 tablet by mouth at bedtime.     BYSTOLIC  5 MG tablet TAKE 1 TABLET DAILY (Patient taking differently: Take 5 mg by mouth daily.) 90 tablet 0   diphenoxylate -atropine  (LOMOTIL ) 2.5-0.025 MG tablet Take 1  tablet by mouth 4 (four) times daily as needed for diarrhea or loose stools. Avoid use until you complete therapy for your C diff infection (until completion of oral vancomycin )     Dulaglutide  (TRULICITY ) 3 MG/0.5ML SOAJ Inject 3 mg into the skin once a week. 6 mL 1   EPINEPHrine  0.3 mg/0.3 mL IJ SOAJ injection Inject 0.3 mg into the muscle as needed for anaphylaxis.     glucose blood (BAYER CONTOUR NEXT TEST) test strip Use to  test blood sugar 3 times daily as instructed. Dx code: E11.65 100 each 11   Lancets (ACCU-CHEK MULTICLIX) lancets Use to test blood sugar 3 times daily as instructed. Dx: E11.65 100 each 11   lisinopril  (ZESTRIL ) 10 MG tablet Take 1 tablet (10 mg total) by mouth daily. Follow your blood pressures with your outpatient provider for refills 90 tablet 0   metFORMIN  (GLUCOPHAGE ) 1000 MG tablet Take 1,000 mg by mouth every evening.     omeprazole (PRILOSEC) 40 MG capsule Take 40 mg by mouth daily.  2   pregabalin  (LYRICA ) 300 MG capsule Take 300 mg by mouth 2 (two) times daily.     rosuvastatin  (CRESTOR ) 20 MG tablet Take 1 tablet (20 mg total) by mouth daily. 90 tablet 0   sitaGLIPtin  (JANUVIA ) 100 MG tablet Take 100 mg by mouth daily.     No current facility-administered medications on file prior to visit.    There are no Patient Instructions on file for this visit. No follow-ups on file.   Gwendlyn JONELLE Shank, NP

## 2024-07-12 ENCOUNTER — Other Ambulatory Visit (HOSPITAL_BASED_OUTPATIENT_CLINIC_OR_DEPARTMENT_OTHER): Payer: Self-pay

## 2024-07-13 DIAGNOSIS — Z79899 Other long term (current) drug therapy: Secondary | ICD-10-CM | POA: Diagnosis not present

## 2024-07-20 DIAGNOSIS — G720 Drug-induced myopathy: Secondary | ICD-10-CM | POA: Diagnosis not present

## 2024-07-20 DIAGNOSIS — Z79899 Other long term (current) drug therapy: Secondary | ICD-10-CM | POA: Diagnosis not present

## 2024-07-20 DIAGNOSIS — Z1231 Encounter for screening mammogram for malignant neoplasm of breast: Secondary | ICD-10-CM | POA: Diagnosis not present

## 2024-07-20 DIAGNOSIS — F419 Anxiety disorder, unspecified: Secondary | ICD-10-CM | POA: Diagnosis not present

## 2024-07-20 DIAGNOSIS — I10 Essential (primary) hypertension: Secondary | ICD-10-CM | POA: Diagnosis not present

## 2024-07-20 DIAGNOSIS — E7800 Pure hypercholesterolemia, unspecified: Secondary | ICD-10-CM | POA: Diagnosis not present

## 2024-07-20 DIAGNOSIS — T466X5A Adverse effect of antihyperlipidemic and antiarteriosclerotic drugs, initial encounter: Secondary | ICD-10-CM | POA: Diagnosis not present

## 2024-07-20 DIAGNOSIS — Z789 Other specified health status: Secondary | ICD-10-CM | POA: Diagnosis not present

## 2024-07-20 DIAGNOSIS — E118 Type 2 diabetes mellitus with unspecified complications: Secondary | ICD-10-CM | POA: Diagnosis not present

## 2024-07-20 DIAGNOSIS — F32A Depression, unspecified: Secondary | ICD-10-CM | POA: Diagnosis not present

## 2024-07-23 ENCOUNTER — Other Ambulatory Visit: Payer: Self-pay | Admitting: Internal Medicine

## 2024-07-23 DIAGNOSIS — Z1231 Encounter for screening mammogram for malignant neoplasm of breast: Secondary | ICD-10-CM

## 2024-07-26 DIAGNOSIS — G720 Drug-induced myopathy: Secondary | ICD-10-CM | POA: Diagnosis not present

## 2024-07-26 DIAGNOSIS — I10 Essential (primary) hypertension: Secondary | ICD-10-CM | POA: Diagnosis not present

## 2024-07-26 DIAGNOSIS — I447 Left bundle-branch block, unspecified: Secondary | ICD-10-CM | POA: Diagnosis not present

## 2024-07-26 DIAGNOSIS — R0602 Shortness of breath: Secondary | ICD-10-CM | POA: Diagnosis not present

## 2024-07-26 DIAGNOSIS — E118 Type 2 diabetes mellitus with unspecified complications: Secondary | ICD-10-CM | POA: Diagnosis not present

## 2024-07-26 DIAGNOSIS — Z789 Other specified health status: Secondary | ICD-10-CM | POA: Diagnosis not present

## 2024-07-26 DIAGNOSIS — G459 Transient cerebral ischemic attack, unspecified: Secondary | ICD-10-CM | POA: Diagnosis not present

## 2024-07-26 DIAGNOSIS — E7800 Pure hypercholesterolemia, unspecified: Secondary | ICD-10-CM | POA: Diagnosis not present

## 2024-07-26 DIAGNOSIS — R000 Tachycardia, unspecified: Secondary | ICD-10-CM | POA: Diagnosis not present

## 2024-07-26 DIAGNOSIS — T466X5A Adverse effect of antihyperlipidemic and antiarteriosclerotic drugs, initial encounter: Secondary | ICD-10-CM | POA: Diagnosis not present

## 2024-07-31 ENCOUNTER — Encounter (INDEPENDENT_AMBULATORY_CARE_PROVIDER_SITE_OTHER): Payer: Medicare HMO

## 2024-07-31 ENCOUNTER — Ambulatory Visit (INDEPENDENT_AMBULATORY_CARE_PROVIDER_SITE_OTHER): Payer: Medicare HMO | Admitting: Vascular Surgery

## 2024-09-14 ENCOUNTER — Encounter (HOSPITAL_COMMUNITY): Payer: Self-pay | Admitting: Student

## 2024-09-21 ENCOUNTER — Emergency Department

## 2024-09-21 ENCOUNTER — Emergency Department
Admission: EM | Admit: 2024-09-21 | Discharge: 2024-09-21 | Disposition: A | Discharge status: Home / Self Care | Attending: Emergency Medicine | Admitting: Emergency Medicine

## 2024-09-21 ENCOUNTER — Other Ambulatory Visit: Payer: Self-pay

## 2024-09-21 DIAGNOSIS — R609 Edema, unspecified: Secondary | ICD-10-CM | POA: Diagnosis not present

## 2024-09-21 DIAGNOSIS — I10 Essential (primary) hypertension: Secondary | ICD-10-CM | POA: Diagnosis not present

## 2024-09-21 DIAGNOSIS — K449 Diaphragmatic hernia without obstruction or gangrene: Secondary | ICD-10-CM | POA: Diagnosis not present

## 2024-09-21 DIAGNOSIS — R1031 Right lower quadrant pain: Secondary | ICD-10-CM | POA: Diagnosis not present

## 2024-09-21 DIAGNOSIS — D72829 Elevated white blood cell count, unspecified: Secondary | ICD-10-CM | POA: Insufficient documentation

## 2024-09-21 DIAGNOSIS — E119 Type 2 diabetes mellitus without complications: Secondary | ICD-10-CM | POA: Diagnosis not present

## 2024-09-21 DIAGNOSIS — K4090 Unilateral inguinal hernia, without obstruction or gangrene, not specified as recurrent: Secondary | ICD-10-CM | POA: Diagnosis not present

## 2024-09-21 DIAGNOSIS — K529 Noninfective gastroenteritis and colitis, unspecified: Secondary | ICD-10-CM | POA: Diagnosis not present

## 2024-09-21 DIAGNOSIS — I700 Atherosclerosis of aorta: Secondary | ICD-10-CM | POA: Diagnosis not present

## 2024-09-21 DIAGNOSIS — R109 Unspecified abdominal pain: Secondary | ICD-10-CM | POA: Diagnosis not present

## 2024-09-21 DIAGNOSIS — R1084 Generalized abdominal pain: Secondary | ICD-10-CM | POA: Diagnosis present

## 2024-09-21 DIAGNOSIS — Z8719 Personal history of other diseases of the digestive system: Secondary | ICD-10-CM | POA: Diagnosis not present

## 2024-09-21 LAB — COMPREHENSIVE METABOLIC PANEL WITH GFR
ALT: 18 U/L (ref 0–44)
AST: 24 U/L (ref 15–41)
Albumin: 3.9 g/dL (ref 3.5–5.0)
Alkaline Phosphatase: 74 U/L (ref 38–126)
Anion gap: 13 (ref 5–15)
BUN: 32 mg/dL — ABNORMAL HIGH (ref 8–23)
CO2: 23 mmol/L (ref 22–32)
Calcium: 9.1 mg/dL (ref 8.9–10.3)
Chloride: 101 mmol/L (ref 98–111)
Creatinine, Ser: 0.99 mg/dL (ref 0.44–1.00)
GFR, Estimated: 59 mL/min — ABNORMAL LOW (ref >60)
Glucose, Bld: 138 mg/dL — ABNORMAL HIGH (ref 70–99)
Potassium: 4.7 mmol/L (ref 3.5–5.1)
Sodium: 137 mmol/L (ref 135–145)
Total Bilirubin: 0.6 mg/dL (ref 0.0–1.2)
Total Protein: 7.5 g/dL (ref 6.5–8.1)

## 2024-09-21 LAB — CBC
HCT: 36 % (ref 36.0–46.0)
Hemoglobin: 11.4 g/dL — ABNORMAL LOW (ref 12.0–15.0)
MCH: 27.4 pg (ref 26.0–34.0)
MCHC: 31.7 g/dL (ref 30.0–36.0)
MCV: 86.5 fL (ref 80.0–100.0)
Platelets: 262 K/uL (ref 150–400)
RBC: 4.16 MIL/uL (ref 3.87–5.11)
RDW: 15.9 % — ABNORMAL HIGH (ref 11.5–15.5)
WBC: 13.4 K/uL — ABNORMAL HIGH (ref 4.0–10.5)
nRBC: 0 % (ref 0.0–0.2)

## 2024-09-21 LAB — URINALYSIS, ROUTINE W REFLEX MICROSCOPIC
Bilirubin Urine: NEGATIVE
Glucose, UA: NEGATIVE mg/dL
Hgb urine dipstick: NEGATIVE
Ketones, ur: NEGATIVE mg/dL
Nitrite: POSITIVE — AB
Protein, ur: NEGATIVE mg/dL
Specific Gravity, Urine: 1.021 (ref 1.005–1.030)
pH: 5 (ref 5.0–8.0)

## 2024-09-21 LAB — LIPASE, BLOOD: Lipase: 42 U/L (ref 11–51)

## 2024-09-21 MED ORDER — CIPROFLOXACIN HCL 500 MG PO TABS: 500.0000 mg | ORAL_TABLET | Freq: Two times a day (BID) | ORAL | 0 refills | Status: AC

## 2024-09-21 MED ORDER — ONDANSETRON 4 MG PO TBDP: 4.0000 mg | ORAL_TABLET | Freq: Four times a day (QID) | ORAL | 0 refills | Status: AC | PRN

## 2024-09-21 MED ORDER — HYDROMORPHONE HCL 1 MG/ML IJ SOLN
0.5000 mg | Freq: Once | INTRAMUSCULAR | Status: AC
  Administered 2024-09-21: 0.5 mg via INTRAVENOUS
  Filled 2024-09-21: qty 0.5

## 2024-09-21 MED ORDER — OXYCODONE-ACETAMINOPHEN 5-325 MG PO TABS
1.0000 | ORAL_TABLET | Freq: Four times a day (QID) | ORAL | 0 refills | Status: AC | PRN
Start: 2024-09-21 — End: 2024-09-24

## 2024-09-21 MED ORDER — METRONIDAZOLE 500 MG PO TABS: 500.0000 mg | ORAL_TABLET | Freq: Two times a day (BID) | ORAL | 0 refills | Status: AC

## 2024-09-21 MED ORDER — IOHEXOL 300 MG/ML  SOLN
100.0000 mL | Freq: Once | INTRAMUSCULAR | Status: AC | PRN
  Administered 2024-09-21: 100 mL via INTRAVENOUS

## 2024-09-21 MED ORDER — ONDANSETRON HCL 4 MG/2ML IJ SOLN
4.0000 mg | Freq: Once | INTRAMUSCULAR | Status: AC
  Administered 2024-09-21: 4 mg via INTRAVENOUS
  Filled 2024-09-21: qty 2

## 2024-09-21 MED ORDER — SODIUM CHLORIDE 0.9 % IV BOLUS
1000.0000 mL | Freq: Once | INTRAVENOUS | Status: AC
  Administered 2024-09-21: 1000 mL via INTRAVENOUS

## 2024-09-21 NOTE — ED Triage Notes (Signed)
 Arrives from [facility] for ED evaluation. C/O RLQ pain. No N/V. PSH: Appendectomy. NAD

## 2024-09-21 NOTE — ED Notes (Signed)
 See triage note  Presents with some abd pain  States pain is mainly on the RLQ   Afebrile on arrival

## 2024-09-21 NOTE — ED Notes (Signed)
 See triage note  Presents with some abd pain  States pain is in lower abd last pm  Positive diarrhea   Afebrile on arrival  Denies any n/v

## 2024-09-21 NOTE — ED Triage Notes (Signed)
 Pt to ED for abd pain, diarrhea started last night.

## 2024-09-21 NOTE — ED Provider Notes (Signed)
 [facility] Provider Note    Event Date/Time   First MD Initiated Contact with Patient 09/21/24 1325     (approximate)   History   Chief Complaint: Abdominal Pain   HPI  [name] is a 78 y.o. female with a history of hypertension diabetes fibromyalgia alcohol  abuse who comes ED complaining of generalized abdominal pain that started last night after dinner.  Went to the bathroom and had a fairly normal bowel movement but pain has persisted all night.  Gradually worsening.  Nonradiating.  No nausea or vomiting, no fever.  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  as needed        Past Medical History:  Diagnosis Date   Alcohol  abuse    CAP (community acquired pneumonia) 05/05/2018   Carotid artery stenosis    Depression    Diabetes mellitus    Diverticulitis    Essential hypertension 08/20/2007   Qualifier: Diagnosis of   By: Norleen MD, Lynwood ORN        Fibromyalgia    Hepatitis 08/28/2014   Hyperlipidemia 08/20/2007   Qualifier: Diagnosis of   By: Norleen MD, Lynwood ORN        Hypertension    Myalgia and myositis 03/28/2012    Current Outpatient Rx   Order #: [id number] Class: Normal   Order #: [id number] Class: Normal   Order #: [id number] Class: Normal   Order #: [id number] Class: Normal   Order #: [id number] Class: No Print   Order #: [id number] Class: Normal   Order #: [id number] Class: Historical Med   Order #: [id number] Class: Normal   Order #: [id number] Class: No Print   Order #: [id number] Class: Normal   Order #: [id number] Class: Historical Med   Order #: [id number] Class: Print   Order #: [id number] Class: Normal   Order #: [id number] Class: Normal   Order #: [id number] Class: Historical Med   Order #: [id number] Class: Historical Med   Order #: [id number] Class: Historical Med   Order #: [id number] Class: Normal   Order #: [id number] Class: Historical Med    Past Surgical History:  Procedure Laterality Date   BUNIONECTOMY      CAROTID PTA/STENT INTERVENTION Left 08/07/2020   Procedure: CAROTID PTA/STENT INTERVENTION;  Surgeon: Marea Selinda RAMAN, MD;  Location: [facility];  Service: Cardiovascular;  Laterality: Left;   CESAREAN SECTION     COLONOSCOPY     COLONOSCOPY WITH ESOPHAGOGASTRODUODENOSCOPY (EGD)     COLONOSCOPY WITH PROPOFOL  N/A 02/18/2021   Procedure: COLONOSCOPY WITH PROPOFOL ;  Surgeon: Toledo, Ladell POUR, MD;  Location: [facility] ENDOS[facility];  Service: Gastroenterology;  Laterality: N/A;   ESOPHAGOGASTRODUODENOSCOPY (EGD) WITH PROPOFOL  N/A 02/18/2021   Procedure: ESOPHAGOGASTRODUODENOSCOPY (EGD) WITH PROPOFOL ;  Surgeon: Toledo, Ladell POUR, MD;  Location: [facility] ENDOS[facility];  Service: Gastroenterology;  Laterality: N/A;   IR ANGIO INTRA EXTRACRAN SEL COM CAROTID INNOMINATE BILAT MOD SED  09/09/2023   IR ANGIO VERTEBRAL SEL VERTEBRAL BILAT MOD SED  09/09/2023   IR ANGIO VERTEBRAL SEL VERTEBRAL UNI R MOD SED  09/19/2023   RADIOLOGY WITH ANESTHESIA N/A 09/19/2023   Procedure: ANGIOPLASTY/STENT OF BASILAR ARTERY WITH ANESTHESIA;  Surgeon: Dolphus Carrion, MD;  Location: [facility] OR;  Service: Radiology;  Laterality: N/A;   TONSILLECTOMY      Physical Exam   Triage Vital Signs: ED Triage Vitals  Encounter Vitals Group     BP 09/21/24 1307 (!) 142/78     Girls Systolic BP Percentile --      Girls Diastolic BP Percentile --  Boys Systolic BP Percentile --      Boys Diastolic BP Percentile --      Pulse Rate 09/21/24 1307 (!) 102     Resp 09/21/24 1307 18     Temp 09/21/24 1307 98.8 F (37.1 C)     Temp src --      SpO2 09/21/24 1307 95 %     Weight 09/21/24 1308 154 lb (69.9 kg)     Height 09/21/24 1308 5' 2 (1.575 m)     Head Circumference --      Peak Flow --      Pain Score 09/21/24 1308 7     Pain Loc --      Pain Education --      Exclude from Growth Chart --     Most recent vital signs: Vitals:   09/21/24 1307 09/21/24 1400  BP: (!) 142/78 (!) 138/58  Pulse: (!) 102 90  Resp: 18 20  Temp: 98.8  F (37.1 C)   SpO2: 95% 98%    General: Awake, no distress.  CV:  Good peripheral perfusion.  Tachycardia heart rate 100 Resp:  Normal effort.  Clear lungs Abd:  No distention.  Soft with generalized tenderness.  No peritonitis Other:  No lower extremity edema   ED Results / Procedures / Treatments   Labs (all labs ordered are listed, but only abnormal results are displayed) Labs Reviewed  COMPREHENSIVE METABOLIC PANEL WITH GFR - Abnormal; Notable for the following components:      Result Value   Glucose, Bld 138 (*)    BUN 32 (*)    GFR, Estimated 59 (*)    All other components within normal limits  CBC - Abnormal; Notable for the following components:   WBC 13.4 (*)    Hemoglobin 11.4 (*)    RDW 15.9 (*)    All other components within normal limits  URINALYSIS, ROUTINE W REFLEX MICROSCOPIC - Abnormal; Notable for the following components:   Color, Urine YELLOW (*)    APPearance HAZY (*)    Nitrite POSITIVE (*)    Leukocytes,Ua TRACE (*)    Bacteria, UA MANY (*)    Non Squamous Epithelial PRESENT (*)    All other components within normal limits  URINE CULTURE  LIPASE, BLOOD     EKG    RADIOLOGY CT abdomen pelvis interpreted by me, negative for obstruction or perforation.  Radiology report reviewed noting ascending colitis, normal bladder appearance   PROCEDURES:  Procedures   MEDICATIONS ORDERED IN ED: Medications  sodium chloride  0.9 % bolus 1,000 mL (0 mLs Intravenous Stopped 09/21/24 1555)  ondansetron  (ZOFRAN ) injection 4 mg (4 mg Intravenous Given 09/21/24 1504)  HYDROmorphone  (DILAUDID ) injection 0.5 mg (0.5 mg Intravenous Given 09/21/24 1504)  iohexol  (OMNIPAQUE ) 300 MG/ML solution 100 mL (100 mLs Intravenous Contrast Given 09/21/24 1510)     IMPRESSION / MDM / ASSESSMENT AND PLAN / ED COURSE  I reviewed the triage vital signs and the nursing notes.  DDx: Diverticulitis, colitis, bowel obstruction, cystitis, malignancy, pancreatitis,  dehydration  Patient's presentation is most consistent with acute presentation with potential threat to life or bodily function.  Patient presents with generalized abdominal pain and tenderness, none reassuring exam.  Vital signs show mild tachycardia, labs show leukocytosis of 13,000.  Not septic.  Will give fluids Zofran  Dilaudid  and obtain CT.   ----------------------------------------- 4:44 PM on 09/21/2024 ----------------------------------------- CT shows colitis of the ascending colon.  Patient is nontoxic, feeling much better after supportive  care in the ED.  Offered hospitalization but she is comfortable managing at home with oral medications.  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  Denies any urinary symptoms at all, will add urine culture for the abnormal urinalysis.  She says that her urine test often look abnormal. Stable for discharge.      FINAL CLINICAL IMPRESSION(S) / ED DIAGNOSES   Final diagnoses:  Colitis     Rx / DC Orders   ED Discharge Orders          Ordered    ciprofloxacin  (CIPRO ) 500 MG tablet  2 times daily        09/21/24 1643    metroNIDAZOLE  (FLAGYL ) 500 MG tablet  2 times daily        09/21/24 1643    oxyCODONE -acetaminophen  (PERCOCET) 5-325 MG tablet  Every 6 hours PRN        09/21/24 1643    ondansetron  (ZOFRAN -ODT) 4 MG disintegrating tablet  Every 6 hours PRN        09/21/24 1643             Note:  This document was prepared using Dragon voice recognition software and may include unintentional dictation errors.   Viviann Pastor, MD 09/21/24 [phone]

## 2024-09-24 LAB — URINE CULTURE: Culture: >=100000 — AB

## 2024-09-25 ENCOUNTER — Telehealth: Payer: Self-pay

## 2024-09-25 NOTE — Telephone Encounter (Signed)
 Called patient on 10/14 to F/on ED visit and discuss urine culture results

## 2024-09-25 NOTE — Progress Notes (Addendum)
 ED Antimicrobial Stewardship Positive Culture Follow Up   [name] is an 78 y.o. female who presented to [facility] on 09/21/2024 with a chief complaint of  Chief Complaint  Patient presents with   Abdominal Pain    Recent Results (from the past 720 hours)  Urine Culture     Status: Abnormal   Collection Time: 09/21/24  1:07 PM   Specimen: Urine, Clean Catch  Result Value Ref Range Status   Specimen Description   Final    URINE, CLEAN CATCH Performed at [facility], [address] [place], [place] [address]    Special Requests   Final    NONE Performed at [facility], [address], [place], [place] [address]    Culture >=100,000 COLONIES/mL ESCHERICHIA COLI (A)  Final   Report Status 09/24/2024 FINAL  Final   Organism ID, Bacteria ESCHERICHIA COLI (A)  Final      Susceptibility   Escherichia coli - MIC*    AMPICILLIN >=32 RESISTANT Resistant     CEFAZOLIN  (URINE) Value in next row Sensitive      4 SENSITIVEThis is a modified FDA-approved test that has been validated and its performance characteristics determined by the reporting laboratory.  This laboratory is certified under the Clinical Laboratory Improvement Amendments CLIA as qualified to perform high complexity clinical laboratory testing.    CEFEPIME Value in next row Sensitive      4 SENSITIVEThis is a modified FDA-approved test that has been validated and its performance characteristics determined by the reporting laboratory.  This laboratory is certified under the Clinical Laboratory Improvement Amendments CLIA as qualified to perform high complexity clinical laboratory testing.    ERTAPENEM Value in next row Sensitive      4 SENSITIVEThis is a modified FDA-approved test that has been validated and its performance characteristics determined by the reporting laboratory.  This laboratory is certified under the Clinical Laboratory Improvement Amendments CLIA as qualified to perform high  complexity clinical laboratory testing.    CEFTRIAXONE  Value in next row Sensitive      4 SENSITIVEThis is a modified FDA-approved test that has been validated and its performance characteristics determined by the reporting laboratory.  This laboratory is certified under the Clinical Laboratory Improvement Amendments CLIA as qualified to perform high complexity clinical laboratory testing.    CIPROFLOXACIN  Value in next row Resistant      4 SENSITIVEThis is a modified FDA-approved test that has been validated and its performance characteristics determined by the reporting laboratory.  This laboratory is certified under the Clinical Laboratory Improvement Amendments CLIA as qualified to perform high complexity clinical laboratory testing.    GENTAMICIN Value in next row Sensitive      4 SENSITIVEThis is a modified FDA-approved test that has been validated and its performance characteristics determined by the reporting laboratory.  This laboratory is certified under the Clinical Laboratory Improvement Amendments CLIA as qualified to perform high complexity clinical laboratory testing.    NITROFURANTOIN  Value in next row Sensitive      4 SENSITIVEThis is a modified FDA-approved test that has been validated and its performance characteristics determined by the reporting laboratory.  This laboratory is certified under the Clinical Laboratory Improvement Amendments CLIA as qualified to perform high complexity clinical laboratory testing.    TRIMETH/SULFA Value in next row Sensitive      4 SENSITIVEThis is a modified FDA-approved test that has been validated and its performance characteristics determined by the reporting laboratory.  This  laboratory is certified under the Clinical Laboratory Improvement Amendments CLIA as qualified to perform high complexity clinical laboratory testing.    AMPICILLIN/SULBACTAM Value in next row Intermediate      4 SENSITIVEThis is a modified FDA-approved test that has been  validated and its performance characteristics determined by the reporting laboratory.  This laboratory is certified under the Clinical Laboratory Improvement Amendments CLIA as qualified to perform high complexity clinical laboratory testing.    PIP/TAZO Value in next row Intermediate      64 INTERMEDIATEThis is a modified FDA-approved test that has been validated and its performance characteristics determined by the reporting laboratory.  This laboratory is certified under the Clinical Laboratory Improvement Amendments CLIA as qualified to perform high complexity clinical laboratory testing.    MEROPENEM Value in next row Sensitive      64 INTERMEDIATEThis is a modified FDA-approved test that has been validated and its performance characteristics determined by the reporting laboratory.  This laboratory is certified under the Clinical Laboratory Improvement Amendments CLIA as qualified to perform high complexity clinical laboratory testing.    * >=100,000 COLONIES/mL ESCHERICHIA COLI    [x]  Treated with Ciprofloxacin , organism resistant to prescribed antimicrobial   New antibiotic prescription: Discussed with MD to check in to see if the patient has any symptoms. If patient reports any signs and symptoms of UTI can start Keflex  500 mg BID x5 days.  Called patient x2 and LVM. Will monitor.  ED Provider: Suzanne Clotilda Annabella LOISE Haynes 09/25/2024, 1:22 PM Clinical Pharmacist Monday - Friday phone -  [phone] Saturday - Sunday phone - [phone]

## 2024-10-26 DIAGNOSIS — E118 Type 2 diabetes mellitus with unspecified complications: Secondary | ICD-10-CM | POA: Diagnosis not present

## 2024-10-26 DIAGNOSIS — Z79899 Other long term (current) drug therapy: Secondary | ICD-10-CM | POA: Diagnosis not present

## 2024-10-31 DIAGNOSIS — Z8719 Personal history of other diseases of the digestive system: Secondary | ICD-10-CM | POA: Diagnosis not present

## 2024-10-31 DIAGNOSIS — I10 Essential (primary) hypertension: Secondary | ICD-10-CM | POA: Diagnosis not present

## 2024-10-31 DIAGNOSIS — E7800 Pure hypercholesterolemia, unspecified: Secondary | ICD-10-CM | POA: Diagnosis not present

## 2024-10-31 DIAGNOSIS — T466X5A Adverse effect of antihyperlipidemic and antiarteriosclerotic drugs, initial encounter: Secondary | ICD-10-CM | POA: Diagnosis not present

## 2024-10-31 DIAGNOSIS — G720 Drug-induced myopathy: Secondary | ICD-10-CM | POA: Diagnosis not present

## 2024-10-31 DIAGNOSIS — F32A Depression, unspecified: Secondary | ICD-10-CM | POA: Diagnosis not present

## 2024-10-31 DIAGNOSIS — E118 Type 2 diabetes mellitus with unspecified complications: Secondary | ICD-10-CM | POA: Diagnosis not present

## 2024-10-31 DIAGNOSIS — F419 Anxiety disorder, unspecified: Secondary | ICD-10-CM | POA: Diagnosis not present

## 2024-11-06 DIAGNOSIS — G459 Transient cerebral ischemic attack, unspecified: Secondary | ICD-10-CM | POA: Diagnosis not present

## 2024-11-06 DIAGNOSIS — R2990 Unspecified symptoms and signs involving the nervous system: Secondary | ICD-10-CM | POA: Diagnosis not present

## 2024-11-14 ENCOUNTER — Other Ambulatory Visit: Payer: Self-pay | Admitting: Neurology

## 2024-11-14 DIAGNOSIS — Z1231 Encounter for screening mammogram for malignant neoplasm of breast: Secondary | ICD-10-CM | POA: Diagnosis not present

## 2024-11-14 DIAGNOSIS — M542 Cervicalgia: Secondary | ICD-10-CM

## 2024-11-16 DIAGNOSIS — R8290 Unspecified abnormal findings in urine: Secondary | ICD-10-CM | POA: Diagnosis not present

## 2024-11-17 ENCOUNTER — Inpatient Hospital Stay: Admission: RE | Admit: 2024-11-17 | Discharge: 2024-11-17 | Attending: Neurology | Admitting: Neurology

## 2024-11-17 DIAGNOSIS — M542 Cervicalgia: Secondary | ICD-10-CM

## 2024-11-17 DIAGNOSIS — M4722 Other spondylosis with radiculopathy, cervical region: Secondary | ICD-10-CM | POA: Diagnosis not present

## 2024-11-17 DIAGNOSIS — M50122 Cervical disc disorder at C5-C6 level with radiculopathy: Secondary | ICD-10-CM | POA: Diagnosis not present

## 2024-11-29 ENCOUNTER — Other Ambulatory Visit (INDEPENDENT_AMBULATORY_CARE_PROVIDER_SITE_OTHER): Payer: Self-pay | Admitting: Vascular Surgery

## 2024-11-29 DIAGNOSIS — K551 Chronic vascular disorders of intestine: Secondary | ICD-10-CM

## 2024-11-29 DIAGNOSIS — I6523 Occlusion and stenosis of bilateral carotid arteries: Secondary | ICD-10-CM

## 2024-11-30 ENCOUNTER — Other Ambulatory Visit (INDEPENDENT_AMBULATORY_CARE_PROVIDER_SITE_OTHER)

## 2024-11-30 ENCOUNTER — Ambulatory Visit (INDEPENDENT_AMBULATORY_CARE_PROVIDER_SITE_OTHER): Admitting: Vascular Surgery

## 2024-11-30 ENCOUNTER — Encounter (INDEPENDENT_AMBULATORY_CARE_PROVIDER_SITE_OTHER): Payer: Self-pay | Admitting: Vascular Surgery

## 2024-11-30 VITALS — BP 120/72 | HR 93 | Resp 18 | Wt 146.6 lb

## 2024-11-30 DIAGNOSIS — I6523 Occlusion and stenosis of bilateral carotid arteries: Secondary | ICD-10-CM

## 2024-11-30 DIAGNOSIS — E1139 Type 2 diabetes mellitus with other diabetic ophthalmic complication: Secondary | ICD-10-CM | POA: Diagnosis not present

## 2024-11-30 DIAGNOSIS — I10 Essential (primary) hypertension: Secondary | ICD-10-CM

## 2024-11-30 DIAGNOSIS — K551 Chronic vascular disorders of intestine: Secondary | ICD-10-CM

## 2024-11-30 NOTE — Assessment & Plan Note (Signed)
 blood glucose control important in reducing the progression of atherosclerotic disease. Also, involved in wound healing. On appropriate medications.

## 2024-11-30 NOTE — Progress Notes (Signed)
 "   MRN : [id number]  [name] is a 78 y.o. (Sep 18, 1946) female who presents with chief complaint of  Chief Complaint  Patient presents with   Follow-up    6 month mesenteric and carotid u/s follow up  .  History of Present Illness:   Discussed the use of AI scribe software for clinical note transcription with the patient, who gave verbal consent to proceed.  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  Over the past several months, she has had significant lower gastrointestinal symptoms with acute colitis, gastrointestinal bleeding, and progressive anemia with hemoglobin now in the 9 g/dL range. CT during an emergency room visit showed acute colitis with concerning shadowing. She is concerned these symptoms may reflect mesenteric ischemia.  She is under active gastroenterology care and is scheduled for upper and lower endoscopy on January 7th to evaluate the source of bleeding and colitis. She has informed gastroenterology of her vascular disease and is aware of the overlap between vascular and gastrointestinal pathology.  She has documented elevated velocities in the superior mesenteric artery and is worried about compromised colonic perfusion and possible aneurysm based on her understanding of her vascular anatomy.  She has been followed for her carotid disease for many years.  She underwent a left carotid stent in 2021 and has done well since then.  She denies any focal neurologic symptoms of cerebrovascular ischemia. Specifically, the patient denies amaurosis fugax, speech or swallowing difficulties, or arm or leg weakness or numbness.  Her carotid duplex today shows stable stenosis in the lower end of the 40 to 59% range on the right with a widely patent left carotid  stent.    Results   Radiology Abdominal and pelvic CT: Acute colitis;   Diagnostic Bilateral carotid artery duplex ultrasound: Patent stent on the left; stable 40-59% stenosis right; no increased risk of stroke  Current Outpatient Medications  Medication Sig Dispense Refill   amphetamine -dextroamphetamine (ADDERALL XR) 20 MG 24 hr capsule Take 2 capsules (40 mg total) by mouth every morning. Discuss with neurology before resuming this medicine in the setting of your mini stroke (don't take until you discuss with neurology)     aspirin  EC 81 MG EC tablet Take 1 tablet (81 mg total) by mouth daily. Swallow whole. 90 tablet 3   B Complex Vitamins (B COMPLEX-B12) TABS Take 1 tablet by mouth at bedtime.     BYSTOLIC  5 MG tablet TAKE 1 TABLET DAILY (Patient taking differently: Take 5 mg by mouth daily.) 90 tablet 0   diphenoxylate -atropine  (LOMOTIL ) 2.5-0.025 MG tablet Take 1 tablet by mouth 4 (four) times daily as needed for diarrhea or loose stools. Avoid use until you complete therapy for your C diff infection (until completion of oral vancomycin )     Dulaglutide  (TRULICITY ) 3 MG/0.5ML SOAJ Inject 3 mg into the skin once a week. 6 mL 1   EPINEPHrine  0.3 mg/0.3 mL IJ SOAJ injection Inject 0.3 mg into the muscle as needed for anaphylaxis.     glucose blood (BAYER CONTOUR NEXT TEST) test strip Use to test blood sugar 3 times daily as instructed. Dx code: E11.65 100 each 11   Lancets (ACCU-CHEK MULTICLIX) lancets Use to test blood sugar 3 times daily as instructed. Dx: E11.65 100 each 11   lisinopril  (ZESTRIL ) 10 MG tablet  Take 1 tablet (10 mg total) by mouth daily. Follow your blood pressures with your outpatient provider for refills 90 tablet 0   metFORMIN  (GLUCOPHAGE ) 1000 MG tablet Take 1,000 mg by mouth every evening.     omeprazole (PRILOSEC) 40 MG capsule Take 40 mg by mouth daily.  2   ondansetron  (ZOFRAN -ODT) 4 MG disintegrating tablet Take 1 tablet (4 mg total) by mouth every 6 (six)  hours as needed for nausea or vomiting. 20 tablet 0   rosuvastatin  (CRESTOR ) 20 MG tablet Take 1 tablet (20 mg total) by mouth daily. 90 tablet 0   sitaGLIPtin  (JANUVIA ) 100 MG tablet Take 100 mg by mouth daily.     pregabalin  (LYRICA ) 300 MG capsule Take 300 mg by mouth 2 (two) times daily. (Patient not taking: Reported on 11/30/2024)     No current facility-administered medications for this visit.    Past Medical History:  Diagnosis Date   Alcohol  abuse    CAP (community acquired pneumonia) 05/05/2018   Carotid artery stenosis    Depression    Diabetes mellitus    Diverticulitis    Essential hypertension 08/20/2007   Qualifier: Diagnosis of   By: Norleen MD, [place] ORN        Fibromyalgia    Hepatitis 08/28/2014   Hyperlipidemia 08/20/2007   Qualifier: Diagnosis of   By: Norleen MD, [place] ORN        Hypertension    Myalgia and myositis 03/28/2012    Past Surgical History:  Procedure Laterality Date   BUNIONECTOMY     CAROTID PTA/STENT INTERVENTION Left 08/07/2020   Procedure: CAROTID PTA/STENT INTERVENTION;  Surgeon: Marea Selinda RAMAN, MD;  Location: [facility];  Service: Cardiovascular;  Laterality: Left;   CESAREAN SECTION     COLONOSCOPY     COLONOSCOPY WITH ESOPHAGOGASTRODUODENOSCOPY (EGD)     COLONOSCOPY WITH PROPOFOL  N/A 02/18/2021   Procedure: COLONOSCOPY WITH PROPOFOL ;  Surgeon: Toledo, Ladell POUR, MD;  Location: [facility];  Service: Gastroenterology;  Laterality: N/A;   ESOPHAGOGASTRODUODENOSCOPY (EGD) WITH PROPOFOL  N/A 02/18/2021   Procedure: ESOPHAGOGASTRODUODENOSCOPY (EGD) WITH PROPOFOL ;  Surgeon: Toledo, Ladell POUR, MD;  Location: [facility];  Service: Gastroenterology;  Laterality: N/A;   IR ANGIO INTRA EXTRACRAN SEL COM CAROTID INNOMINATE BILAT MOD SED  09/09/2023   IR ANGIO VERTEBRAL SEL VERTEBRAL BILAT MOD SED  09/09/2023   IR ANGIO VERTEBRAL SEL VERTEBRAL UNI R MOD SED  09/19/2023   RADIOLOGY WITH ANESTHESIA N/A 09/19/2023   Procedure: ANGIOPLASTY/STENT  OF BASILAR ARTERY WITH ANESTHESIA;  Surgeon: Dolphus Carrion, MD;  Location: [facility];  Service: Radiology;  Laterality: N/A;   TONSILLECTOMY       Social History[1]    Family History  Problem Relation Age of Onset   Cancer Mother    Heart disease Father    Heart disease Sister      Allergies[2]   REVIEW OF SYSTEMS (Negative unless checked)  Constitutional: [] Weight loss  [] Fever  [] Chills Cardiac: [] Chest pain   [] Chest pressure   [] Palpitations   [] Shortness of breath when laying flat   [] Shortness of breath at rest   [] Shortness of breath with exertion. Vascular:  [] Pain in legs with walking   [] Pain in legs at rest   [] Pain in legs when laying flat   [] Claudication   [] Pain in feet when walking  [] Pain in feet at rest  [] Pain in feet when laying flat   [] History of DVT   [] Phlebitis   [] Swelling in legs   []   Varicose veins   [] Non-healing ulcers Pulmonary:   [] Uses home oxygen   [] Productive cough   [] Hemoptysis   [] Wheeze  [] COPD   [] Asthma Neurologic:  [] Dizziness  [] Blackouts   [] Seizures   [] History of stroke   [] History of TIA  [] Aphasia   [] Temporary blindness   [] Dysphagia   [] Weakness or numbness in arms   [] Weakness or numbness in legs Musculoskeletal:  [x] Arthritis   [] Joint swelling   [] Joint pain   [] Low back pain Hematologic:  [] Easy bruising  [] Easy bleeding   [] Hypercoagulable state   [x] Anemic   Gastrointestinal:  [x] Blood in stool   [] Vomiting blood  [] Gastroesophageal reflux/heartburn   [x] Abdominal pain Genitourinary:  [] Chronic kidney disease   [] Difficult urination  [] Frequent urination  [] Burning with urination   [] Hematuria Skin:  [] Rashes   [] Ulcers   [] Wounds Psychological:  [] History of anxiety   [x]  History of major depression.  Physical Examination  BP 120/72 (BP Location: Left Arm)   Pulse 93   Resp 18   Wt 146 lb 9.6 oz (66.5 kg)   BMI 26.81 kg/m  Gen:  WD/WN, NAD Head: [place]/AT, No temporalis wasting. Ear/Nose/Throat: Hearing grossly intact,  nares w/o erythema or drainage Eyes: Conjunctiva clear. Sclera non-icteric Neck: Supple.  Trachea midline Pulmonary:  Good air movement, no use of accessory muscles.  Cardiac: RRR, no JVD Vascular:  Vessel Right Left  Radial Palpable Palpable                                   Gastrointestinal: soft, non-tender/non-distended. No guarding/reflex.  Musculoskeletal: M/S 5/5 throughout.  No deformity or atrophy. No edema. Neurologic: Sensation grossly intact in extremities.  Symmetrical.  Speech is fluent.  Psychiatric: Judgment intact, Mood & affect appropriate for pt's clinical situation. Dermatologic: No rashes or ulcers noted.  No cellulitis or open wounds.  Physical Exam     Labs Recent Results (from the past 2160 hours)  Lipase, blood     Status: None   Collection Time: 09/21/24  1:07 PM  Result Value Ref Range   Lipase 42 11 - 51 U/L    Comment: Performed at [facility], [address] [place], [place] [address]  Comprehensive metabolic panel     Status: Abnormal   Collection Time: 09/21/24  1:07 PM  Result Value Ref Range   Sodium 137 135 - 145 mmol/L   Potassium 4.7 3.5 - 5.1 mmol/L   Chloride 101 98 - 111 mmol/L   CO2 23 22 - 32 mmol/L   Glucose, Bld 138 (H) 70 - 99 mg/dL    Comment: Glucose reference range applies only to samples taken after fasting for at least 8 hours.   BUN 32 (H) 8 - 23 mg/dL   Creatinine, Ser 9.00 0.44 - 1.00 mg/dL   Calcium  9.1 8.9 - 10.3 mg/dL   Total Protein 7.5 6.5 - 8.1 g/dL   Albumin 3.9 3.5 - 5.0 g/dL   AST 24 15 - 41 U/L   ALT 18 0 - 44 U/L   Alkaline Phosphatase 74 38 - 126 U/L   Total Bilirubin 0.6 0.0 - 1.2 mg/dL   GFR, Estimated 59 (L) >60 mL/min    Comment: (NOTE) Calculated using the CKD-EPI Creatinine Equation (2021)    Anion gap 13 5 - 15    Comment: Performed at [facility], [address], [place], [place] [address]  CBC  Status: Abnormal   Collection Time: 09/21/24  1:07 PM  Result  Value Ref Range   WBC 13.4 (H) 4.0 - 10.5 K/uL   RBC 4.16 3.87 - 5.11 MIL/uL   Hemoglobin 11.4 (L) 12.0 - 15.0 g/dL   HCT 63.9 63.9 - 53.9 %   MCV 86.5 80.0 - 100.0 fL   MCH 27.4 26.0 - 34.0 pg   MCHC 31.7 30.0 - 36.0 g/dL   RDW 84.0 (H) 88.4 - 84.4 %   Platelets 262 150 - 400 K/uL   nRBC 0.0 0.0 - 0.2 %    Comment: Performed at [facility], [address] [place], [place] [address]  Urinalysis, Routine w reflex microscopic -Urine, Clean Catch     Status: Abnormal   Collection Time: 09/21/24  1:07 PM  Result Value Ref Range   Color, Urine YELLOW (A) YELLOW   APPearance HAZY (A) CLEAR   Specific Gravity, Urine 1.021 1.005 - 1.030   pH 5.0 5.0 - 8.0   Glucose, UA NEGATIVE NEGATIVE mg/dL   Hgb urine dipstick NEGATIVE NEGATIVE   Bilirubin Urine NEGATIVE NEGATIVE   Ketones, ur NEGATIVE NEGATIVE mg/dL   Protein, ur NEGATIVE NEGATIVE mg/dL   Nitrite POSITIVE (A) NEGATIVE   Leukocytes,Ua TRACE (A) NEGATIVE   RBC / HPF 0-5 0 - 5 RBC/hpf   WBC, UA 6-10 0 - 5 WBC/hpf   Bacteria, UA MANY (A) NONE SEEN   Squamous Epithelial / HPF 0-5 0 - 5 /HPF   Mucus PRESENT    Non Squamous Epithelial PRESENT (A) NONE SEEN    Comment: Performed at [facility], [address], [place], [place] [address]  Urine Culture     Status: Abnormal   Collection Time: 09/21/24  1:07 PM   Specimen: Urine, Clean Catch  Result Value Ref Range   Specimen Description      URINE, CLEAN CATCH Performed at [facility], [address], [place], [place] [address]    Special Requests      NONE Performed at [facility], [address] [place], [place] [address]    Culture >=100,000 COLONIES/mL ESCHERICHIA COLI (A)    Report Status 09/24/2024 FINAL    Organism ID, Bacteria ESCHERICHIA COLI (A)       Susceptibility   Escherichia coli - MIC*    AMPICILLIN >=32 RESISTANT Resistant     CEFAZOLIN  (URINE) Value in next row Sensitive      4 SENSITIVEThis is a modified  FDA-approved test that has been validated and its performance characteristics determined by the reporting laboratory.  This laboratory is certified under the Clinical Laboratory Improvement Amendments CLIA as qualified to perform high complexity clinical laboratory testing.    CEFEPIME Value in next row Sensitive      4 SENSITIVEThis is a modified FDA-approved test that has been validated and its performance characteristics determined by the reporting laboratory.  This laboratory is certified under the Clinical Laboratory Improvement Amendments CLIA as qualified to perform high complexity clinical laboratory testing.    ERTAPENEM Value in next row Sensitive      4 SENSITIVEThis is a modified FDA-approved test that has been validated and its performance characteristics determined by the reporting laboratory.  This laboratory is certified under the Clinical Laboratory Improvement Amendments CLIA as qualified to perform high complexity clinical laboratory testing.    CEFTRIAXONE  Value in next row Sensitive      4 SENSITIVEThis is a modified FDA-approved test that has been validated and  its performance characteristics determined by the reporting laboratory.  This laboratory is certified under the Clinical Laboratory Improvement Amendments CLIA as qualified to perform high complexity clinical laboratory testing.    CIPROFLOXACIN  Value in next row Resistant      4 SENSITIVEThis is a modified FDA-approved test that has been validated and its performance characteristics determined by the reporting laboratory.  This laboratory is certified under the Clinical Laboratory Improvement Amendments CLIA as qualified to perform high complexity clinical laboratory testing.    GENTAMICIN Value in next row Sensitive      4 SENSITIVEThis is a modified FDA-approved test that has been validated and its performance characteristics determined by the reporting laboratory.  This laboratory is certified under the Clinical Laboratory  Improvement Amendments CLIA as qualified to perform high complexity clinical laboratory testing.    NITROFURANTOIN  Value in next row Sensitive      4 SENSITIVEThis is a modified FDA-approved test that has been validated and its performance characteristics determined by the reporting laboratory.  This laboratory is certified under the Clinical Laboratory Improvement Amendments CLIA as qualified to perform high complexity clinical laboratory testing.    TRIMETH/SULFA Value in next row Sensitive      4 SENSITIVEThis is a modified FDA-approved test that has been validated and its performance characteristics determined by the reporting laboratory.  This laboratory is certified under the Clinical Laboratory Improvement Amendments CLIA as qualified to perform high complexity clinical laboratory testing.    AMPICILLIN/SULBACTAM Value in next row Intermediate      4 SENSITIVEThis is a modified FDA-approved test that has been validated and its performance characteristics determined by the reporting laboratory.  This laboratory is certified under the Clinical Laboratory Improvement Amendments CLIA as qualified to perform high complexity clinical laboratory testing.    PIP/TAZO Value in next row Intermediate      64 INTERMEDIATEThis is a modified FDA-approved test that has been validated and its performance characteristics determined by the reporting laboratory.  This laboratory is certified under the Clinical Laboratory Improvement Amendments CLIA as qualified to perform high complexity clinical laboratory testing.    MEROPENEM Value in next row Sensitive      64 INTERMEDIATEThis is a modified FDA-approved test that has been validated and its performance characteristics determined by the reporting laboratory.  This laboratory is certified under the Clinical Laboratory Improvement Amendments CLIA as qualified to perform high complexity clinical laboratory testing.    * >=100,000 COLONIES/mL ESCHERICHIA COLI     Radiology MR CERVICAL SPINE WO CONTRAST Result Date: 11/21/2024 CLINICAL DATA:  Chronic neck pain radiating down bilateral shoulders EXAM: MRI CERVICAL SPINE WITHOUT CONTRAST TECHNIQUE: Multiplanar, multisequence MR imaging of the cervical spine was performed. No intravenous contrast was administered. COMPARISON:  None Available. FINDINGS: Alignment: Minimal retrolisthesis of C3 on C4 and C5 on C6. Minimal anterolisthesis of C7 on T1. Vertebrae: No acute fracture, evidence of discitis, or aggressive bone lesion. Cord: Normal signal and morphology. Posterior Fossa, vertebral arteries, paraspinal tissues: Posterior fossa demonstrates no focal abnormality. Vertebral artery flow voids are maintained. Paraspinal soft tissues are unremarkable. Disc levels: Discs: Mild disc height loss at C3-4. Moderate disc height loss at C5-6. C2-3: No significant disc bulge. No neural foraminal stenosis. No central canal stenosis. C3-4: Mild disc bulge. Bilateral uncovertebral joint changes. Mild bilateral facet arthropathy. Bilateral severe foraminal stenosis. No central canal stenosis. C4-5: No disc herniation. Bilateral uncovertebral joint changes. Mild left and severe right foraminal stenosis. Moderate right facet arthropathy. C5-6: Mild disc bulge.  Bilateral uncovertebral joint changes. Mild bilateral facet arthropathy. Severe right and moderate left foraminal stenosis. No central canal stenosis. C6-7: No significant disc bulge. Mild bilateral facet arthropathy. No foraminal or central canal stenosis. C7-T1: No significant disc bulge. Moderate bilateral facet arthropathy. No foraminal or central canal stenosis. IMPRESSION: 1. Cervical spine spondylosis as described above. 2. No acute osseous injury of the cervical spine. Electronically Signed   By: Julaine Blanch M.D.   On: 11/21/2024 17:12    Assessment/Plan  Assessment & Plan Superior mesenteric artery stenosis Chronic stenosis with elevated velocities in both the  celiac and SMA and potential contribution to gastrointestinal symptoms. Mesenteric ischemia as a contributor to colitis remains under consideration. - Communicated with gastroenterology (Dr. Aundria via secure chat) to coordinate care. - Requested post-colonoscopy findings and discussion on vascular contribution. - Reassess need for stenting after colonoscopy and multidisciplinary discussion. - Re-evaluate in six months if no intervention is recommended by gastroenterology.  Bilateral carotid artery stenosis, post-stenting Stent on the left is widely patent with 40-59% stable right carotid stenosis.  - Continue annual surveillance of carotid stents. -  continue current medical regimen   Essential hypertension, benign blood pressure control important in reducing the progression of atherosclerotic disease. On appropriate oral medications.   Diabetes mellitus, type 2 (HCC) blood glucose control important in reducing the progression of atherosclerotic disease. Also, involved in wound healing. On appropriate medications.     Selinda Gu, MD  11/30/2024 10:17 AM    This note was created with Dragon medical transcription system.  Any errors from dictation are purely unintentional     [1]  Social History Tobacco Use   Smoking status: Every Day    Current packs/day: 0.00    Types: Cigarettes, E-cigarettes    Last attempt to quit: 03/11/2010    Years since quitting: 14.7   Smokeless tobacco: Never  Vaping Use   Vaping status: Former  Substance Use Topics   Alcohol  use: Not Currently   Drug use: No    Comment: Pt denies   [2]  Allergies Allergen Reactions   Diflucan [Fluconazole] Nausea And Vomiting   Latex Hives   Lipitor [Atorvastatin  Calcium ] Other (See Comments)    Muscle aches   Codeine Rash    And nausea    Pentazocine Lactate Rash   Sulfonamide Derivatives Rash   "

## 2024-11-30 NOTE — Assessment & Plan Note (Signed)
 blood pressure control important in reducing the progression of atherosclerotic disease. On appropriate oral medications.

## 2024-12-04 ENCOUNTER — Encounter: Payer: Self-pay | Admitting: Internal Medicine

## 2024-12-19 ENCOUNTER — Ambulatory Visit: Admitting: General Practice

## 2024-12-19 ENCOUNTER — Ambulatory Visit
Admission: RE | Admit: 2024-12-19 | Discharge: 2024-12-19 | Disposition: A | Discharge status: Home / Self Care | Attending: Internal Medicine | Admitting: Internal Medicine

## 2024-12-19 ENCOUNTER — Encounter: Admission: RE | Disposition: A | Payer: Self-pay | Source: Home / Self Care | Attending: Internal Medicine

## 2024-12-19 ENCOUNTER — Encounter: Payer: Self-pay | Admitting: Internal Medicine

## 2024-12-19 DIAGNOSIS — K625 Hemorrhage of anus and rectum: Secondary | ICD-10-CM | POA: Insufficient documentation

## 2024-12-19 DIAGNOSIS — K5730 Diverticulosis of large intestine without perforation or abscess without bleeding: Secondary | ICD-10-CM | POA: Diagnosis not present

## 2024-12-19 DIAGNOSIS — R194 Change in bowel habit: Secondary | ICD-10-CM | POA: Insufficient documentation

## 2024-12-19 DIAGNOSIS — K2970 Gastritis, unspecified, without bleeding: Secondary | ICD-10-CM | POA: Insufficient documentation

## 2024-12-19 DIAGNOSIS — K529 Noninfective gastroenteritis and colitis, unspecified: Secondary | ICD-10-CM | POA: Insufficient documentation

## 2024-12-19 DIAGNOSIS — K219 Gastro-esophageal reflux disease without esophagitis: Secondary | ICD-10-CM | POA: Diagnosis not present

## 2024-12-19 DIAGNOSIS — K633 Ulcer of intestine: Secondary | ICD-10-CM | POA: Insufficient documentation

## 2024-12-19 DIAGNOSIS — D500 Iron deficiency anemia secondary to blood loss (chronic): Secondary | ICD-10-CM | POA: Diagnosis present

## 2024-12-19 HISTORY — PX: ESOPHAGOGASTRODUODENOSCOPY: SHX5428

## 2024-12-19 HISTORY — PX: BIOPSY OF SKIN SUBCUTANEOUS TISSUE AND/OR MUCOUS MEMBRANE: SHX6741

## 2024-12-19 HISTORY — PX: COLONOSCOPY: SHX5424

## 2024-12-19 LAB — GLUCOSE, CAPILLARY: Glucose-Capillary: 133 mg/dL — ABNORMAL HIGH (ref 70–99)

## 2024-12-19 SURGERY — COLONOSCOPY
Anesthesia: General

## 2024-12-19 MED ORDER — SODIUM CHLORIDE 0.9 % IV SOLN: INTRAVENOUS | Status: DC

## 2024-12-19 MED ORDER — PROPOFOL 1000 MG/100ML IV EMUL
INTRAVENOUS | Status: AC
  Filled 2024-12-19: qty 100

## 2024-12-19 MED ORDER — PROPOFOL 10 MG/ML IV BOLUS
INTRAVENOUS | Status: DC | PRN
  Administered 2024-12-19 (×2): 20 mg via INTRAVENOUS
  Administered 2024-12-19: 70 mg via INTRAVENOUS
  Administered 2024-12-19: 200 ug/kg/min via INTRAVENOUS

## 2024-12-19 NOTE — Interval H&P Note (Signed)
 History and Physical Interval Note:  12/19/2024 9:52 AM  [name]  has presented today for surgery, with the diagnosis of Iron deficiency anemia due to chronic blood loss (D50.0) Colitis, unspecified (K52.9) Chronic diarrhea (K52.9) Hx of adenomatous colonic polyps (Z86.0101) History of lower GI bleeding (Z87.19) GERD without esophagitis (K21.9).  The various methods of treatment have been discussed with the patient and family. After consideration of risks, benefits and other options for treatment, the patient has consented to  Procedures: COLONOSCOPY (N/A) EGD (ESOPHAGOGASTRODUODENOSCOPY) (N/A) as a surgical intervention.  The patient's history has been reviewed, patient examined, no change in status, stable for surgery.  I have reviewed the patient's chart and labs.  Questions were answered to the patient's satisfaction.     [place], Aldonia Keeven

## 2024-12-19 NOTE — Anesthesia Postprocedure Evaluation (Signed)
"   Anesthesia Post Note  Patient: CHEYENNA PANKOWSKI  Procedure(s) Performed: COLONOSCOPY EGD (ESOPHAGOGASTRODUODENOSCOPY) BIOPSY,GI  Patient location during evaluation: PACU Anesthesia Type: General Level of consciousness: awake and alert Pain management: pain level controlled Vital Signs Assessment: post-procedure vital signs reviewed and stable Respiratory status: spontaneous breathing, nonlabored ventilation, respiratory function stable and patient connected to nasal cannula oxygen Cardiovascular status: blood pressure returned to baseline and stable Postop Assessment: no apparent nausea or vomiting Anesthetic complications: no   There were no known notable events for this encounter.   Last Vitals:  Vitals:   12/19/24 1110 12/19/24 1111  BP:  (!) 113/52  Pulse: 75 75  Resp: 14 19  Temp:    SpO2: 97% 97%    Last Pain:  Vitals:   12/19/24 1030  TempSrc: Temporal                 Debby Mines      "

## 2024-12-19 NOTE — H&P (Signed)
 Outpatient short stay form Pre-procedure 12/19/2024 9:49 AM [name], M.D.  Primary Physician: [name], M.D.  Reason for visit:  GERD without esophagitis, Hx of LGI bleeding, Hx of adenomatous colon polyps, New Dx of iron deficiency anemia.  History of present illness:  [name] presents to the [facility] GI clinic for follow-up of colitis. She was seen at [facility] ED 10/10 two months ago for chief complaint of acute generalized abdominal pain which started previous night after dinner. Sheh had mild leukocytosis with WBC 13K and mild tachycardia. She was given IV fluids, Zofran , Dilaudid , and had CT. CT scan commented on prominent and diffuse wall thickening and edema involving the cecum and ascending colon with pericolonic stranding. Findings likely represent infectious or inflammatory colitis, but neoplastic involvement with either colon cancer or lymphoma are not excluded. She was discharged with Cipro  and Flagyl . She reports she took antibiotics as prescribed and did feel like her abdominal pain resolved. She had a lot of issues with diarrhea at this time too. She turned in stool studies and [facility] said they were not labeled properly so order was cancelled. She left the country on 11/1 - 11/6 to go on a trip to [place]. Her diarrhea had resolved by this point so she didn't pursue stool studies further. Recent visit with Dr. Costa earlier this month and labs showed new diagnosis of IDA with hemoglobin 9.6, MCV 85, ferritin 7, serum iron 37, TIBC 500, and iron sat 7%. She was started on oral iron supplement twice daily and told to turn in set of hemoccult cards. She denies any overt gastrointestinal bleeding. Her baseline hemoglobin has been 10-11s. She denies any current issues with diarrhea, hematochezia, melena, fecal urgency, or fecal incontinence. She denies any abdominal pain or abdominal cramping. Appetite and diet are stable without any unintentional weight loss. She takes omeprazole 20  mg once daily for GERD symptoms and this works well. She denies any refractory GERD symptoms. She denies any complaints of esophageal dysphagia, odynophagia, nausea, vomiting, early satiety, hoarseness, or epigastric abdominal pain. She has appointment with Dr. 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 Revascularization has not been recommended since she is asymptomatic at this time. She has hx of LGIB presumed 2/2 self-limited diverticular bleed.     Current Medications[1]  Medications Prior to Admission  Medication Sig Dispense Refill Last Dose/Taking   amphetamine -dextroamphetamine (ADDERALL XR) 20 MG 24 hr capsule Take 2 capsules (40 mg total) by mouth every morning. Discuss with neurology before resuming this medicine in the setting of your mini stroke (don't take until you discuss with neurology)   12/18/2024   aspirin  EC 81 MG EC tablet Take 1 tablet (81 mg total) by mouth daily. Swallow whole. 90 tablet 3 12/18/2024   lisinopril  (ZESTRIL ) 10 MG tablet Take 1 tablet (10 mg total) by mouth daily. Follow your blood pressures with your outpatient provider for refills 90 tablet 0 12/19/2024   metFORMIN  (GLUCOPHAGE ) 1000 MG tablet Take 1,000 mg by mouth every evening.   Past Week   omeprazole (PRILOSEC) 40 MG capsule Take 40 mg by mouth daily.  2 12/18/2024   sitaGLIPtin  (JANUVIA ) 100 MG tablet Take 100 mg by mouth daily.   12/18/2024   B Complex Vitamins (B COMPLEX-B12) TABS Take 1 tablet by mouth at bedtime.      BYSTOLIC  5 MG tablet TAKE 1 TABLET DAILY (Patient taking differently: Take 5 mg  by mouth daily.) 90 tablet 0    diphenoxylate -atropine  (LOMOTIL ) 2.5-0.025 MG tablet Take 1 tablet by mouth 4 (four) times daily as needed for diarrhea or loose stools. Avoid use until you complete therapy for your C diff infection (until completion of oral vancomycin )      Dulaglutide  (TRULICITY ) 3 MG/0.5ML SOAJ Inject 3 mg into the  skin once a week. 6 mL 1 12/09/2024   EPINEPHrine  0.3 mg/0.3 mL IJ SOAJ injection Inject 0.3 mg into the muscle as needed for anaphylaxis.      glucose blood (BAYER CONTOUR NEXT TEST) test strip Use to test blood sugar 3 times daily as instructed. Dx code: E11.65 100 each 11    Lancets (ACCU-CHEK MULTICLIX) lancets Use to test blood sugar 3 times daily as instructed. Dx: E11.65 100 each 11    ondansetron  (ZOFRAN -ODT) 4 MG disintegrating tablet Take 1 tablet (4 mg total) by mouth every 6 (six) hours as needed for nausea or vomiting. 20 tablet 0    pregabalin  (LYRICA ) 300 MG capsule Take 300 mg by mouth 2 (two) times daily. (Patient not taking: Reported on 11/30/2024)      rosuvastatin  (CRESTOR ) 20 MG tablet Take 1 tablet (20 mg total) by mouth daily. 90 tablet 0      Allergies[2]   Past Medical History:  Diagnosis Date   Alcohol  abuse    CAP (community acquired pneumonia) 05/05/2018   Carotid artery stenosis    Depression    Diabetes mellitus    Diverticulitis    Essential hypertension 08/20/2007   Qualifier: Diagnosis of   By: Norleen MD, Lynwood ORN        Fibromyalgia    Hepatitis 08/28/2014   Hyperlipidemia 08/20/2007   Qualifier: Diagnosis of   By: Norleen MD, Lynwood ORN        Hypertension    Myalgia and myositis 03/28/2012    Review of systems:  Otherwise negative.    Physical Exam  Gen: Alert, oriented. Appears stated age.  HEENT: [place]/AT. PERRLA. Lungs: CTA, no wheezes. CV: RR nl S1, S2. Abd: soft, benign, no masses. BS+ Ext: No edema. Pulses 2+    Planned procedures: Proceed with EGD and colonoscopy. The patient understands the nature of the planned procedure, indications, risks, alternatives and potential complications including but not limited to bleeding, infection, perforation, damage to internal organs and possible oversedation/side effects from anesthesia. The patient agrees and gives consent to proceed.  Please refer to procedure notes for findings, recommendations and  patient disposition/instructions.     Finnlee Guarnieri K. [name], M.D. Gastroenterology 12/19/2024  9:49 AM          [1]  Current Facility-Administered Medications:    0.9 %  sodium chloride  infusion, , Intravenous, Continuous, Bresha Hosack K, MD, Last Rate: 20 mL/hr at 12/19/24 0924, New Bag at 12/19/24 0924 [2]  Allergies Allergen Reactions   Diflucan [Fluconazole] Nausea And Vomiting   Latex Hives   Lipitor [Atorvastatin  Calcium ] Other (See Comments)    Muscle aches   Codeine Rash    And nausea    Pentazocine Lactate Rash   Sulfonamide Derivatives Rash

## 2024-12-19 NOTE — Op Note (Signed)
 [facility] Gastroenterology Patient Name: [name] Procedure Date: 12/19/2024 9:58 AM MRN: [id number] Account #: [id number] Date of Birth: [birth year] Admit Type: Outpatient Age: 79 Room: [facility] ENDO ROOM 2 Gender: Female Note Status: Finalized Instrument Name: Endoscope [id number] Procedure:             Upper GI endoscopy Indications:           Iron deficiency anemia secondary to chronic blood                         loss, Gastro-esophageal reflux disease Providers:             Harlyn Rathmann K. Aundria MD, MD Referring MD:          Reyes BIRCH. Auston, MD (Referring MD) Medicines:             Propofol  per Anesthesia Complications:         No immediate complications. Estimated blood loss:                         Minimal. Procedure:             Pre-Anesthesia Assessment:                        - The risks and benefits of the procedure and the                         sedation options and risks were discussed with the                         patient. All questions were answered and informed                         consent was obtained.                        - Patient identification and proposed procedure were                         verified prior to the procedure by the nurse. The                         procedure was verified in the procedure room.                        - ASA Grade Assessment: III - A patient with severe                         systemic disease.                        - After reviewing the risks and benefits, the patient                         was deemed in satisfactory condition to undergo the                         procedure.                        After obtaining  informed consent, the endoscope was                         passed under direct vision. Throughout the procedure,                         the patient's blood pressure, pulse, and oxygen                         saturations were monitored continuously. The Endoscope                          was introduced through the mouth, and advanced to the                         third part of duodenum. The upper GI endoscopy was                         accomplished without difficulty. The patient tolerated                         the procedure well. Findings:      The esophagus was normal.      Patchy mild inflammation characterized by erythema was found in the       gastric antrum. Estimated blood loss: none.      The exam of the stomach was otherwise normal.      The cardia and gastric fundus were normal on retroflexion.      The examined duodenum was normal. Biopsies for histology were taken with       a cold forceps for evaluation of celiac disease. Estimated blood loss       was minimal. Impression:            - Normal esophagus.                        - Gastritis.                        - Normal examined duodenum. Biopsied. Recommendation:        - Await pathology results.                        - Proceed with colonoscopy Procedure Code(s):     --- Professional ---                        [id number], Esophagogastroduodenoscopy, flexible,                         transoral; with biopsy, single or multiple Diagnosis Code(s):     --- Professional ---                        K21.9, Gastro-esophageal reflux disease without                         esophagitis                        D50.0, Iron deficiency anemia secondary to blood loss                         (  chronic)                        K29.70, Gastritis, unspecified, without bleeding CPT copyright 2022 American Medical Association. All rights reserved. The codes documented in this report are preliminary and upon coder review may  be revised to meet current compliance requirements. Ladell MARLA Boss MD, MD 12/19/2024 10:18:17 AM This report has been signed electronically. Number of Addenda: 0 Note Initiated On: 12/19/2024 9:58 AM Estimated Blood Loss:  Estimated blood loss was minimal.      [facility]

## 2024-12-19 NOTE — Op Note (Signed)
 [facility] Gastroenterology Patient Name: [name] Procedure Date: 12/19/2024 9:57 AM MRN: [id number] Account #: [id number] Date of Birth: 01/13/1946 Admit Type: Outpatient Age: 79 Room: [facility] ENDO ROOM 2 Gender: Female Note Status: Finalized Instrument Name: Colon Scope [id number] Procedure:             Colonoscopy Indications:           Functional diarrhea, Rectal bleeding, Iron deficiency                         anemia secondary to chronic blood loss, Change in                         bowel habits, colitis Providers:             Janneth Krasner K. Aundria MD, MD Referring MD:          Reyes BIRCH. Auston, MD (Referring MD) Medicines:             Propofol  per Anesthesia Complications:         No immediate complications. Estimated blood loss:                         Minimal. Procedure:             Pre-Anesthesia Assessment:                        - The risks and benefits of the procedure and the                         sedation options and risks were discussed with the                         patient. All questions were answered and informed                         consent was obtained.                        - Patient identification and proposed procedure were                         verified prior to the procedure by the nurse. The                         procedure was verified in the procedure room.                        - ASA Grade Assessment: III - A patient with severe                         systemic disease.                        - After reviewing the risks and benefits, the patient                         was deemed in satisfactory condition to undergo the  procedure.                        After obtaining informed consent, the colonoscope was                         passed under direct vision. Throughout the procedure,                         the patient's blood pressure, pulse, and oxygen                         saturations were  monitored continuously. The                         Colonoscope was introduced through the anus and                         advanced to the the terminal ileum, with                         identification of the appendiceal orifice and IC                         valve. The colonoscopy was performed without                         difficulty. The patient tolerated the procedure well.                         The quality of the bowel preparation was good. The                         terminal ileum, ileocecal valve, appendiceal orifice,                         and rectum were photographed. Findings:      The perianal and digital rectal examinations were normal. Pertinent       negatives include normal sphincter tone.      A few small-mouthed diverticula were found in the sigmoid colon.      A single (solitary) twenty mm ulcer was found in the cecum. No bleeding       was present. No stigmata of recent bleeding were seen. Biopsies were       taken with a cold forceps for histology. Estimated blood loss was       minimal.      The terminal ileum appeared normal.      The exam was otherwise without abnormality on direct and retroflexion       views. Impression:            - Diverticulosis in the sigmoid colon.                        - A single (solitary) ulcer in the cecum. Biopsied.                        - The examined portion of the ileum was normal.                        -  The examination was otherwise normal on direct and                         retroflexion views. Recommendation:        - Await pathology results from EGD, also performed                         today.                        - Patient has a contact number available for                         emergencies. The signs and symptoms of potential                         delayed complications were discussed with the patient.                         Return to normal activities tomorrow. Written                         discharge  instructions were provided to the patient.                        - Resume previous diet.                        - Continue present medications.                        - Await pathology results.                        - Discuss vascular surgery referral for possible                         ischemic ulcer in the cecum and abnormal CTA showing                         SMA stenosis.                        - Follow up with Jonette Primmer, PA-C in the GI office.                         [phone]                        - Telephone GI office to schedule appointment at                         appointment to be scheduled.                        - The findings and recommendations were discussed with                         the patient. Procedure Code(s):     --- Professional ---  54619, Colonoscopy, flexible; with biopsy, single or                         multiple Diagnosis Code(s):     --- Professional ---                        K57.30, Diverticulosis of large intestine without                         perforation or abscess without bleeding                        R19.4, Change in bowel habit                        D50.0, Iron deficiency anemia secondary to blood loss                         (chronic)                        K62.5, Hemorrhage of anus and rectum                        K59.1, Functional diarrhea                        K63.3, Ulcer of intestine CPT copyright 2022 American Medical Association. All rights reserved. The codes documented in this report are preliminary and upon coder review may  be revised to meet current compliance requirements. Ladell MARLA Boss MD, MD 12/19/2024 10:39:36 AM This report has been signed electronically. Number of Addenda: 0 Note Initiated On: 12/19/2024 9:57 AM Scope Withdrawal Time: 0 hours 5 minutes 45 seconds  Total Procedure Duration: 0 hours 10 minutes 23 seconds  Estimated Blood Loss:  Estimated blood loss was minimal.       [facility]

## 2024-12-19 NOTE — Anesthesia Preprocedure Evaluation (Signed)
"                                    Anesthesia Evaluation  Patient identified by MRN, date of birth, ID band Patient awake    Reviewed: Allergy & Precautions, NPO status , Patient's Chart, lab work & pertinent test results  History of Anesthesia Complications Negative for: history of anesthetic complications  Airway Mallampati: II  TM Distance: >3 FB Neck ROM: Full    Dental  (+) Dental Advisory Given, Teeth Intact   Pulmonary Patient abstained from smoking., former smoker   Pulmonary exam normal        Cardiovascular hypertension, Pt. on medications and Pt. on home beta blockers + Peripheral Vascular Disease  Normal cardiovascular exam   '24 TTE - EF 60 to 65%. Mild LVH. Grade I diastolic dysfunction (impaired relaxation). Possible small PFO with a few bubble within 3-5 cardiac cycles. No significant valve d/o      Neuro/Psych  PSYCHIATRIC DISORDERS Anxiety Depression    TIA   GI/Hepatic ,GERD  Medicated and Controlled,,(+)     substance abuse  alcohol  use  Endo/Other  diabetes, Type 2, Oral Hypoglycemic Agents    Renal/GU      Musculoskeletal   Abdominal   Peds  Hematology  (+) Blood dyscrasia, anemia  On plavix     Anesthesia Other Findings On GLP-1a, last dose > 7 days ago    Reproductive/Obstetrics                              Anesthesia Physical Anesthesia Plan  ASA: 2  Anesthesia Plan: General   Post-op Pain Management: Minimal or no pain anticipated   Induction: Intravenous  PONV Risk Score and Plan: 2 and Propofol  infusion and TIVA  Airway Management Planned: Nasal Cannula  Additional Equipment: None  Intra-op Plan:   Post-operative Plan:   Informed Consent: I have reviewed the patients History and Physical, chart, labs and discussed the procedure including the risks, benefits and alternatives for the proposed anesthesia with the patient or authorized representative who has indicated his/her  understanding and acceptance.     Dental advisory given  Plan Discussed with: CRNA and Surgeon  Anesthesia Plan Comments: (Discussed risks of anesthesia with patient, including possibility of difficulty with spontaneous ventilation under anesthesia necessitating airway intervention, PONV, and rare risks such as cardiac or respiratory or neurological events, and allergic reactions. Discussed the role of CRNA in patient's perioperative care. Patient understands.)        Anesthesia Quick Evaluation  "

## 2024-12-19 NOTE — Interval H&P Note (Signed)
 History and Physical Interval Note:  12/19/2024 9:51 AM  [name]  has presented today for surgery, with the diagnosis of Iron deficiency anemia due to chronic blood loss (D50.0) Colitis, unspecified (K52.9) Chronic diarrhea (K52.9) Hx of adenomatous colonic polyps (Z86.0101) History of lower GI bleeding (Z87.19) GERD without esophagitis (K21.9).  The various methods of treatment have been discussed with the patient and family. After consideration of risks, benefits and other options for treatment, the patient has consented to  Procedures: COLONOSCOPY (N/A) EGD (ESOPHAGOGASTRODUODENOSCOPY) (N/A) as a surgical intervention.  The patient's history has been reviewed, patient examined, no change in status, stable for surgery.  I have reviewed the patient's chart and labs.  Questions were answered to the patient's satisfaction.     [name], [name]

## 2024-12-19 NOTE — Transfer of Care (Signed)
 Immediate Anesthesia Transfer of Care Note  Patient: [name]  Procedure(s) Performed: COLONOSCOPY EGD (ESOPHAGOGASTRODUODENOSCOPY)  Patient Location: PACU and Endoscopy Unit  Anesthesia Type:General  Level of Consciousness: awake, alert , and oriented  Airway & Oxygen Therapy: Patient Spontanous Breathing  Post-op Assessment: Report given to RN and Post -op Vital signs reviewed and stable  Post vital signs: stable  Last Vitals:  Vitals Value Taken Time  BP 106/52 12/19/24 10:38  Temp    Pulse 90 12/19/24 10:38  Resp 13 12/19/24 10:38  SpO2 93 % 12/19/24 10:38  Vitals shown include unfiled device data.  Last Pain:  Vitals:   12/19/24 1030  TempSrc: Temporal         Complications: There were no known notable events for this encounter.

## 2024-12-20 LAB — SURGICAL PATHOLOGY

## 2024-12-21 NOTE — Progress Notes (Signed)
 NORABELLE KONDO                                          MRN: [id number]   12/21/2024   The [facility] Quality Team Specialist reviewed this patient medical record for the purposes of chart review for care gap closure. The following were reviewed: chart review for care gap closure-kidney health evaluation for diabetes:eGFR  and uACR.    [facility] Quality Team

## 2024-12-25 ENCOUNTER — Telehealth (INDEPENDENT_AMBULATORY_CARE_PROVIDER_SITE_OTHER): Payer: Self-pay

## 2024-12-25 NOTE — Telephone Encounter (Signed)
 Patient called into nurse line to report she has 4 upcoming flights in the same week and wanted to check to be sure there are no safety concerns and contraindications with her taking a blood thinner currently. Please advise

## 2024-12-25 NOTE — Telephone Encounter (Signed)
 Can we call and get the concerns directly.  She isn't on a blood thinner (at least not on her med list) but generally flying is safe with carotid disease

## 2024-12-25 NOTE — Telephone Encounter (Signed)
 Patient reports she is taking brilinta  90 mg 2x daily in addition to the baby Asprin, (added to her active medications.) Per NP advised patient this is still ok with flying as log as no doses are missed. Patient notified and in agreement. No further questions at this time.

## 2025-01-15 ENCOUNTER — Ambulatory Visit (INDEPENDENT_AMBULATORY_CARE_PROVIDER_SITE_OTHER): Admitting: Vascular Surgery

## 2025-01-15 VITALS — BP 97/64 | HR 86 | Ht 62.0 in | Wt 143.0 lb

## 2025-01-15 DIAGNOSIS — E1139 Type 2 diabetes mellitus with other diabetic ophthalmic complication: Secondary | ICD-10-CM | POA: Diagnosis not present

## 2025-01-15 DIAGNOSIS — I10 Essential (primary) hypertension: Secondary | ICD-10-CM

## 2025-01-15 DIAGNOSIS — I6523 Occlusion and stenosis of bilateral carotid arteries: Secondary | ICD-10-CM | POA: Diagnosis not present

## 2025-01-15 DIAGNOSIS — K551 Chronic vascular disorders of intestine: Secondary | ICD-10-CM

## 2025-01-16 ENCOUNTER — Telehealth (INDEPENDENT_AMBULATORY_CARE_PROVIDER_SITE_OTHER): Payer: Self-pay

## 2025-01-16 NOTE — Telephone Encounter (Signed)
 Spoke with the patient and she is scheduled with Dr. Marea for SMA angio stent placement on 01/28/25 with a 9:30 am arrival time to the [facility]. Pre-procedure instructions were discussed and will be sent to Mychart and mailed.

## 2025-01-28 ENCOUNTER — Ambulatory Visit: Admission: RE | Admit: 2025-01-28 | Admitting: Vascular Surgery

## 2025-01-28 ENCOUNTER — Encounter: Admission: RE | Payer: Self-pay

## 2025-01-28 DIAGNOSIS — K551 Chronic vascular disorders of intestine: Secondary | ICD-10-CM

## 2025-05-31 ENCOUNTER — Encounter (INDEPENDENT_AMBULATORY_CARE_PROVIDER_SITE_OTHER)

## 2025-05-31 ENCOUNTER — Ambulatory Visit (INDEPENDENT_AMBULATORY_CARE_PROVIDER_SITE_OTHER): Admitting: Vascular Surgery

## 2025-11-29 ENCOUNTER — Ambulatory Visit (INDEPENDENT_AMBULATORY_CARE_PROVIDER_SITE_OTHER): Admitting: Nurse Practitioner

## 2025-11-29 ENCOUNTER — Encounter (INDEPENDENT_AMBULATORY_CARE_PROVIDER_SITE_OTHER)
